# Patient Record
Sex: Male | Born: 1955
Health system: Southern US, Community
[De-identification: ages and names within clinical notes are randomized; demographics above are authoritative.]

## PROBLEM LIST (undated history)

## (undated) DIAGNOSIS — I10 Essential (primary) hypertension: Secondary | ICD-10-CM

## (undated) DIAGNOSIS — I726 Aneurysm of vertebral artery: Secondary | ICD-10-CM

## (undated) DIAGNOSIS — K5900 Constipation, unspecified: Secondary | ICD-10-CM

## (undated) DIAGNOSIS — R911 Solitary pulmonary nodule: Secondary | ICD-10-CM

## (undated) DIAGNOSIS — D751 Secondary polycythemia: Secondary | ICD-10-CM

## (undated) DIAGNOSIS — F32A Depression, unspecified: Secondary | ICD-10-CM

## (undated) DIAGNOSIS — E559 Vitamin D deficiency, unspecified: Secondary | ICD-10-CM

## (undated) DIAGNOSIS — K219 Gastro-esophageal reflux disease without esophagitis: Secondary | ICD-10-CM

## (undated) HISTORY — PX: LIPOMA EXCISION: SHX5283

## (undated) HISTORY — DX: Gastro-esophageal reflux disease without esophagitis: K21.9

## (undated) HISTORY — DX: Solitary pulmonary nodule: R91.1

---

## 2003-11-19 ENCOUNTER — Encounter: Admission: RE | Admit: 2003-11-19 | Discharge: 2003-11-19 | Payer: Self-pay | Admitting: Cardiology

## 2004-01-02 ENCOUNTER — Encounter (INDEPENDENT_AMBULATORY_CARE_PROVIDER_SITE_OTHER): Payer: Self-pay | Admitting: Specialist

## 2004-01-02 ENCOUNTER — Ambulatory Visit (HOSPITAL_BASED_OUTPATIENT_CLINIC_OR_DEPARTMENT_OTHER): Admission: RE | Admit: 2004-01-02 | Discharge: 2004-01-02 | Payer: Self-pay | Admitting: General Surgery

## 2004-01-02 ENCOUNTER — Ambulatory Visit (HOSPITAL_COMMUNITY): Admission: RE | Admit: 2004-01-02 | Discharge: 2004-01-02 | Payer: Self-pay | Admitting: General Surgery

## 2004-02-12 ENCOUNTER — Encounter: Admission: RE | Admit: 2004-02-12 | Discharge: 2004-02-12 | Payer: Self-pay | Admitting: Specialist

## 2004-05-23 ENCOUNTER — Ambulatory Visit (HOSPITAL_COMMUNITY): Admission: RE | Admit: 2004-05-23 | Discharge: 2004-05-23 | Payer: Self-pay | Admitting: Neurosurgery

## 2004-07-28 ENCOUNTER — Encounter: Admission: RE | Admit: 2004-07-28 | Discharge: 2004-07-28 | Payer: Self-pay | Admitting: Cardiology

## 2005-05-19 ENCOUNTER — Encounter: Admission: RE | Admit: 2005-05-19 | Discharge: 2005-06-12 | Payer: Self-pay | Admitting: Orthopedic Surgery

## 2007-08-03 ENCOUNTER — Emergency Department (HOSPITAL_COMMUNITY): Admission: EM | Admit: 2007-08-03 | Discharge: 2007-08-03 | Payer: Self-pay | Admitting: Emergency Medicine

## 2007-08-29 ENCOUNTER — Encounter: Admission: RE | Admit: 2007-08-29 | Discharge: 2007-08-29 | Payer: Self-pay | Admitting: Cardiology

## 2007-10-05 ENCOUNTER — Ambulatory Visit: Payer: Self-pay | Admitting: Internal Medicine

## 2007-10-11 ENCOUNTER — Ambulatory Visit: Admission: RE | Admit: 2007-10-11 | Discharge: 2007-10-11 | Payer: Self-pay | Admitting: Internal Medicine

## 2007-10-11 LAB — CBC WITH DIFFERENTIAL/PLATELET
Basophils Absolute: 0.1 10*3/uL (ref 0.0–0.1)
EOS%: 0.6 % (ref 0.0–7.0)
Eosinophils Absolute: 0 10*3/uL (ref 0.0–0.5)
HGB: 19.2 g/dL — ABNORMAL HIGH (ref 13.0–17.1)
NEUT#: 4.9 10*3/uL (ref 1.5–6.5)
RDW: 14 % (ref 11.2–14.6)
lymph#: 2.1 10*3/uL (ref 0.9–3.3)

## 2007-10-14 LAB — COMPREHENSIVE METABOLIC PANEL
AST: 18 U/L (ref 0–37)
Albumin: 4.6 g/dL (ref 3.5–5.2)
BUN: 14 mg/dL (ref 6–23)
Calcium: 9.5 mg/dL (ref 8.4–10.5)
Chloride: 100 mEq/L (ref 96–112)
Potassium: 3.7 mEq/L (ref 3.5–5.3)
Total Protein: 7.6 g/dL (ref 6.0–8.3)

## 2007-10-14 LAB — JAK2 GENOTYPR

## 2007-10-14 LAB — ERYTHROPOIETIN: Erythropoietin: 10.5 m[IU]/mL (ref 2.6–34.0)

## 2007-10-14 LAB — JAK2 EXONS 12 & 13 MUTATION, REFLEX: JAK2 Exons 12 & 13: 13

## 2007-10-18 ENCOUNTER — Ambulatory Visit (HOSPITAL_COMMUNITY): Admission: RE | Admit: 2007-10-18 | Discharge: 2007-10-18 | Payer: Self-pay | Admitting: Internal Medicine

## 2007-10-25 LAB — CBC WITH DIFFERENTIAL/PLATELET
BASO%: 0.4 % (ref 0.0–2.0)
EOS%: 0.4 % (ref 0.0–7.0)
MCH: 29.6 pg (ref 28.0–33.4)
MCHC: 34.9 g/dL (ref 32.0–35.9)
MONO%: 7.3 % (ref 0.0–13.0)
RDW: 13.7 % (ref 11.2–14.6)
lymph#: 2.3 10*3/uL (ref 0.9–3.3)

## 2007-11-18 ENCOUNTER — Ambulatory Visit: Payer: Self-pay | Admitting: Internal Medicine

## 2007-11-22 LAB — COMPREHENSIVE METABOLIC PANEL
ALT: 12 U/L (ref 0–53)
AST: 13 U/L (ref 0–37)
Albumin: 4.2 g/dL (ref 3.5–5.2)
Alkaline Phosphatase: 109 U/L (ref 39–117)
BUN: 16 mg/dL (ref 6–23)
CO2: 22 mEq/L (ref 19–32)
Calcium: 9.3 mg/dL (ref 8.4–10.5)
Chloride: 106 mEq/L (ref 96–112)
Creatinine, Ser: 1.04 mg/dL (ref 0.40–1.50)
Glucose, Bld: 116 mg/dL — ABNORMAL HIGH (ref 70–99)
Potassium: 3.8 mEq/L (ref 3.5–5.3)
Sodium: 140 mEq/L (ref 135–145)
Total Bilirubin: 1.4 mg/dL — ABNORMAL HIGH (ref 0.3–1.2)
Total Protein: 7.1 g/dL (ref 6.0–8.3)

## 2007-11-22 LAB — CBC WITH DIFFERENTIAL/PLATELET
BASO%: 1.3 % (ref 0.0–2.0)
Basophils Absolute: 0.1 10*3/uL (ref 0.0–0.1)
EOS%: 0.8 % (ref 0.0–7.0)
Eosinophils Absolute: 0.1 10*3/uL (ref 0.0–0.5)
HCT: 46.6 % (ref 38.7–49.9)
HGB: 16.2 g/dL (ref 13.0–17.1)
LYMPH%: 33.5 % (ref 14.0–48.0)
MCH: 28.9 pg (ref 28.0–33.4)
MCHC: 34.9 g/dL (ref 32.0–35.9)
MCV: 82.8 fL (ref 81.6–98.0)
MONO#: 0.5 10*3/uL (ref 0.1–0.9)
MONO%: 7.5 % (ref 0.0–13.0)
NEUT#: 3.7 10*3/uL (ref 1.5–6.5)
NEUT%: 56.8 % (ref 40.0–75.0)
Platelets: 244 10*3/uL (ref 145–400)
RBC: 5.63 10*6/uL (ref 4.20–5.71)
RDW: 13 % (ref 11.2–14.6)
WBC: 6.5 10*3/uL (ref 4.0–10.0)
lymph#: 2.2 10*3/uL (ref 0.9–3.3)

## 2007-11-22 LAB — LACTATE DEHYDROGENASE: LDH: 137 U/L (ref 94–250)

## 2007-12-20 ENCOUNTER — Emergency Department (HOSPITAL_COMMUNITY): Admission: EM | Admit: 2007-12-20 | Discharge: 2007-12-20 | Payer: Self-pay | Admitting: Emergency Medicine

## 2007-12-20 LAB — CBC WITH DIFFERENTIAL/PLATELET
BASO%: 0.6 % (ref 0.0–2.0)
Basophils Absolute: 0 10*3/uL (ref 0.0–0.1)
EOS%: 1 % (ref 0.0–7.0)
Eosinophils Absolute: 0.1 10*3/uL (ref 0.0–0.5)
HCT: 48.7 % (ref 38.7–49.9)
HGB: 16.8 g/dL (ref 13.0–17.1)
LYMPH%: 30.8 % (ref 14.0–48.0)
MCH: 29.2 pg (ref 28.0–33.4)
MCHC: 34.5 g/dL (ref 32.0–35.9)
MCV: 84.6 fL (ref 81.6–98.0)
MONO#: 0.5 10*3/uL (ref 0.1–0.9)
MONO%: 7.7 % (ref 0.0–13.0)
NEUT#: 3.6 10*3/uL (ref 1.5–6.5)
NEUT%: 59.9 % (ref 40.0–75.0)
Platelets: 207 10*3/uL (ref 145–400)
RBC: 5.76 10*6/uL — ABNORMAL HIGH (ref 4.20–5.71)
RDW: 13.6 % (ref 11.2–14.6)
WBC: 6 10*3/uL (ref 4.0–10.0)
lymph#: 1.8 10*3/uL (ref 0.9–3.3)

## 2007-12-20 LAB — COMPREHENSIVE METABOLIC PANEL
ALT: 15 U/L (ref 0–53)
AST: 16 U/L (ref 0–37)
Albumin: 4.1 g/dL (ref 3.5–5.2)
Alkaline Phosphatase: 112 U/L (ref 39–117)
BUN: 14 mg/dL (ref 6–23)
CO2: 26 mEq/L (ref 19–32)
Calcium: 9 mg/dL (ref 8.4–10.5)
Chloride: 104 mEq/L (ref 96–112)
Creatinine, Ser: 1.12 mg/dL (ref 0.40–1.50)
Glucose, Bld: 85 mg/dL (ref 70–99)
Potassium: 4.2 mEq/L (ref 3.5–5.3)
Sodium: 139 mEq/L (ref 135–145)
Total Bilirubin: 1.5 mg/dL — ABNORMAL HIGH (ref 0.3–1.2)
Total Protein: 7.1 g/dL (ref 6.0–8.3)

## 2007-12-20 LAB — LACTATE DEHYDROGENASE: LDH: 151 U/L (ref 94–250)

## 2008-03-13 ENCOUNTER — Ambulatory Visit: Payer: Self-pay | Admitting: Internal Medicine

## 2008-03-15 LAB — COMPREHENSIVE METABOLIC PANEL
ALT: 13 U/L (ref 0–53)
AST: 13 U/L (ref 0–37)
Albumin: 3.9 g/dL (ref 3.5–5.2)
Alkaline Phosphatase: 99 U/L (ref 39–117)
BUN: 12 mg/dL (ref 6–23)
CO2: 28 mEq/L (ref 19–32)
Calcium: 8.7 mg/dL (ref 8.4–10.5)
Chloride: 103 mEq/L (ref 96–112)
Creatinine, Ser: 1.09 mg/dL (ref 0.40–1.50)
Glucose, Bld: 83 mg/dL (ref 70–99)
Potassium: 3.5 mEq/L (ref 3.5–5.3)
Sodium: 141 mEq/L (ref 135–145)
Total Bilirubin: 1.4 mg/dL — ABNORMAL HIGH (ref 0.3–1.2)
Total Protein: 6.7 g/dL (ref 6.0–8.3)

## 2008-03-15 LAB — CBC WITH DIFFERENTIAL/PLATELET
BASO%: 0.7 % (ref 0.0–2.0)
Basophils Absolute: 0 10*3/uL (ref 0.0–0.1)
EOS%: 0.8 % (ref 0.0–7.0)
Eosinophils Absolute: 0 10*3/uL (ref 0.0–0.5)
HCT: 49.8 % (ref 38.7–49.9)
HGB: 17.1 g/dL (ref 13.0–17.1)
LYMPH%: 28.2 % (ref 14.0–48.0)
MCH: 28.6 pg (ref 28.0–33.4)
MCHC: 34.4 g/dL (ref 32.0–35.9)
MCV: 83.2 fL (ref 81.6–98.0)
MONO#: 0.6 10*3/uL (ref 0.1–0.9)
MONO%: 11.7 % (ref 0.0–13.0)
NEUT#: 3.2 10*3/uL (ref 1.5–6.5)
NEUT%: 58.6 % (ref 40.0–75.0)
Platelets: 188 10*3/uL (ref 145–400)
RBC: 5.99 10*6/uL — ABNORMAL HIGH (ref 4.20–5.71)
RDW: 14.9 % — ABNORMAL HIGH (ref 11.2–14.6)
WBC: 5.4 10*3/uL (ref 4.0–10.0)
lymph#: 1.5 10*3/uL (ref 0.9–3.3)

## 2008-03-15 LAB — LACTATE DEHYDROGENASE: LDH: 157 U/L (ref 94–250)

## 2008-04-16 LAB — CBC WITH DIFFERENTIAL/PLATELET
BASO%: 0.7 % (ref 0.0–2.0)
Basophils Absolute: 0 10*3/uL (ref 0.0–0.1)
EOS%: 0.4 % (ref 0.0–7.0)
Eosinophils Absolute: 0 10*3/uL (ref 0.0–0.5)
HCT: 48.8 % (ref 38.4–49.9)
HGB: 16.4 g/dL (ref 13.0–17.1)
LYMPH%: 28.5 % (ref 14.0–49.0)
MCH: 28.1 pg (ref 27.2–33.4)
MCHC: 33.7 g/dL (ref 32.0–36.0)
MCV: 83.5 fL (ref 79.3–98.0)
MONO#: 0.6 10*3/uL (ref 0.1–0.9)
MONO%: 8.3 % (ref 0.0–14.0)
NEUT#: 4.6 10*3/uL (ref 1.5–6.5)
NEUT%: 62.1 % (ref 39.0–75.0)
Platelets: 213 10*3/uL (ref 140–400)
RBC: 5.84 10*6/uL — ABNORMAL HIGH (ref 4.20–5.82)
RDW: 14.7 % — ABNORMAL HIGH (ref 11.0–14.6)
WBC: 7.4 10*3/uL (ref 4.0–10.3)
lymph#: 2.1 10*3/uL (ref 0.9–3.3)

## 2008-05-09 ENCOUNTER — Ambulatory Visit: Payer: Self-pay | Admitting: Internal Medicine

## 2008-05-14 LAB — CBC WITH DIFFERENTIAL/PLATELET
BASO%: 0.3 % (ref 0.0–2.0)
Basophils Absolute: 0 10*3/uL (ref 0.0–0.1)
EOS%: 1.1 % (ref 0.0–7.0)
Eosinophils Absolute: 0.1 10*3/uL (ref 0.0–0.5)
HCT: 48.3 % (ref 38.4–49.9)
HGB: 16.2 g/dL (ref 13.0–17.1)
LYMPH%: 30.5 % (ref 14.0–49.0)
MCH: 27.8 pg (ref 27.2–33.4)
MCHC: 33.5 g/dL (ref 32.0–36.0)
MCV: 83.2 fL (ref 79.3–98.0)
MONO#: 0.5 10*3/uL (ref 0.1–0.9)
MONO%: 8.5 % (ref 0.0–14.0)
NEUT#: 3.8 10*3/uL (ref 1.5–6.5)
NEUT%: 59.6 % (ref 39.0–75.0)
Platelets: 214 10*3/uL (ref 140–400)
RBC: 5.81 10*6/uL (ref 4.20–5.82)
RDW: 13.8 % (ref 11.0–14.6)
WBC: 6.3 10*3/uL (ref 4.0–10.3)
lymph#: 1.9 10*3/uL (ref 0.9–3.3)

## 2008-07-05 ENCOUNTER — Ambulatory Visit: Payer: Self-pay | Admitting: Internal Medicine

## 2008-09-07 ENCOUNTER — Ambulatory Visit: Payer: Self-pay | Admitting: Internal Medicine

## 2008-09-11 LAB — CBC WITH DIFFERENTIAL/PLATELET
BASO%: 0.9 % (ref 0.0–2.0)
Basophils Absolute: 0.1 10*3/uL (ref 0.0–0.1)
EOS%: 0.8 % (ref 0.0–7.0)
Eosinophils Absolute: 0.1 10*3/uL (ref 0.0–0.5)
HCT: 45.9 % (ref 38.4–49.9)
HGB: 15.4 g/dL (ref 13.0–17.1)
LYMPH%: 29.8 % (ref 14.0–49.0)
MCH: 26.7 pg — ABNORMAL LOW (ref 27.2–33.4)
MCHC: 33.5 g/dL (ref 32.0–36.0)
MCV: 79.7 fL (ref 79.3–98.0)
MONO#: 0.5 10*3/uL (ref 0.1–0.9)
MONO%: 8.1 % (ref 0.0–14.0)
NEUT#: 3.8 10*3/uL (ref 1.5–6.5)
NEUT%: 60.4 % (ref 39.0–75.0)
Platelets: 191 10*3/uL (ref 140–400)
RBC: 5.76 10*6/uL (ref 4.20–5.82)
RDW: 14.3 % (ref 11.0–14.6)
WBC: 6.3 10*3/uL (ref 4.0–10.3)
lymph#: 1.9 10*3/uL (ref 0.9–3.3)

## 2008-09-11 LAB — LACTATE DEHYDROGENASE: LDH: 137 U/L (ref 94–250)

## 2009-03-11 ENCOUNTER — Ambulatory Visit: Payer: Self-pay | Admitting: Internal Medicine

## 2009-03-13 LAB — CBC WITH DIFFERENTIAL/PLATELET
BASO%: 0.5 % (ref 0.0–2.0)
Basophils Absolute: 0 10*3/uL (ref 0.0–0.1)
EOS%: 0.9 % (ref 0.0–7.0)
Eosinophils Absolute: 0.1 10*3/uL (ref 0.0–0.5)
HCT: 49.8 % (ref 38.4–49.9)
HGB: 16.5 g/dL (ref 13.0–17.1)
LYMPH%: 31.1 % (ref 14.0–49.0)
MCH: 27.2 pg (ref 27.2–33.4)
MCHC: 33.1 g/dL (ref 32.0–36.0)
MCV: 82.2 fL (ref 79.3–98.0)
MONO#: 0.5 10*3/uL (ref 0.1–0.9)
MONO%: 7.3 % (ref 0.0–14.0)
NEUT#: 4.2 10*3/uL (ref 1.5–6.5)
NEUT%: 60.2 % (ref 39.0–75.0)
Platelets: 200 10*3/uL (ref 140–400)
RBC: 6.06 10*6/uL — ABNORMAL HIGH (ref 4.20–5.82)
RDW: 15.5 % — ABNORMAL HIGH (ref 11.0–14.6)
WBC: 7 10*3/uL (ref 4.0–10.3)
lymph#: 2.2 10*3/uL (ref 0.9–3.3)

## 2009-03-13 LAB — LACTATE DEHYDROGENASE: LDH: 150 U/L (ref 94–250)

## 2009-06-11 ENCOUNTER — Ambulatory Visit: Payer: Self-pay | Admitting: Internal Medicine

## 2010-06-27 NOTE — Op Note (Signed)
NAMEMARLIN, Aaron Dennis                 ACCOUNT NO.:  [id number]   MEDICAL RECORD NO.:  [id number]          PATIENT TYPE:  AMB   LOCATION:  DSC                          FACILITY:  [facility]   PHYSICIAN:  Leonie Man, M.D.   DATE OF BIRTH:  [birth year]   DATE OF PROCEDURE:  01/02/2004  DATE OF DISCHARGE:                                 OPERATIVE REPORT   PREOPERATIVE DIAGNOSIS:  Lipoma right flank.   POSTOPERATIVE DIAGNOSIS:  Lipoma right flank.   PROCEDURE:  Excision of lipoma right flank.   INDICATIONS FOR PROCEDURE:  The patient is a 55 year old man who presents  with a 6 cm mass overlying the lower ribs of the right flank.  This is  clinically a lipoma.  He comes to the operating room after risks and  potential benefits of surgery have been discussed with him and all questions  obtained.   DESCRIPTION OF PROCEDURE:  The patient is positioned in the left lateral  position with the right side up.  The mass is prepped and draped to be  included in a sterile operative field.  The area is infiltrated with 0.5%  Marcaine with epinephrine and a transverse incision is carried down over the  mass, deepened through the skin and subcutaneous tissue, carried down to the  capsule of the mass.  The capsule of the mass is opened and the entire mass  is dissected free and removed in its entirety and forwarded for pathologic  evaluation.  Hemostasis is assured with electrocautery.  Needle, sponge, and  instrument counts correct.  Subcutaneous tissue is closed with interrupted 3-  0 Vicryl suture.  The skin is closed with a running 4-0 Monocryl suture and  then reinforced with Steri-Strips.  A sterile dressing is applied.  Anesthetic reversed.  The patient removed from the operating room to the  recovery room in stable condition tolerating the procedure very well.      Patr   PB/MEDQ  D:  01/02/2004  T:  01/03/2004  Job:  [id number]

## 2010-07-04 ENCOUNTER — Inpatient Hospital Stay (INDEPENDENT_AMBULATORY_CARE_PROVIDER_SITE_OTHER)
Admission: RE | Admit: 2010-07-04 | Discharge: 2010-07-04 | Disposition: A | Discharge status: Home / Self Care | Payer: Self-pay | Source: Ambulatory Visit | Attending: Family Medicine | Admitting: Family Medicine

## 2010-07-04 DIAGNOSIS — M67919 Unspecified disorder of synovium and tendon, unspecified shoulder: Secondary | ICD-10-CM

## 2010-07-04 DIAGNOSIS — R198 Other specified symptoms and signs involving the digestive system and abdomen: Secondary | ICD-10-CM

## 2010-07-04 DIAGNOSIS — M719 Bursopathy, unspecified: Secondary | ICD-10-CM

## 2010-08-07 ENCOUNTER — Emergency Department (HOSPITAL_COMMUNITY)
Admission: EM | Admit: 2010-08-07 | Discharge: 2010-08-07 | Disposition: A | Discharge status: Home / Self Care | Payer: Self-pay | Attending: Emergency Medicine | Admitting: Emergency Medicine

## 2010-08-07 DIAGNOSIS — S61209A Unspecified open wound of unspecified finger without damage to nail, initial encounter: Secondary | ICD-10-CM | POA: Insufficient documentation

## 2010-08-07 DIAGNOSIS — IMO0002 Reserved for concepts with insufficient information to code with codable children: Secondary | ICD-10-CM | POA: Insufficient documentation

## 2010-08-07 DIAGNOSIS — I10 Essential (primary) hypertension: Secondary | ICD-10-CM | POA: Insufficient documentation

## 2010-09-08 ENCOUNTER — Inpatient Hospital Stay (INDEPENDENT_AMBULATORY_CARE_PROVIDER_SITE_OTHER)
Admission: RE | Admit: 2010-09-08 | Discharge: 2010-09-08 | Disposition: A | Discharge status: Home / Self Care | Payer: Self-pay | Source: Ambulatory Visit | Attending: Family Medicine | Admitting: Family Medicine

## 2010-09-08 DIAGNOSIS — M79609 Pain in unspecified limb: Secondary | ICD-10-CM

## 2010-10-07 ENCOUNTER — Other Ambulatory Visit (HOSPITAL_COMMUNITY): Payer: Self-pay | Admitting: Family Medicine

## 2010-10-07 ENCOUNTER — Ambulatory Visit (HOSPITAL_COMMUNITY)
Admission: RE | Admit: 2010-10-07 | Discharge: 2010-10-07 | Disposition: A | Discharge status: Home / Self Care | Payer: Self-pay | Source: Ambulatory Visit | Attending: Family Medicine | Admitting: Family Medicine

## 2010-10-07 DIAGNOSIS — R52 Pain, unspecified: Secondary | ICD-10-CM

## 2010-10-07 DIAGNOSIS — M79609 Pain in unspecified limb: Secondary | ICD-10-CM | POA: Insufficient documentation

## 2010-10-22 ENCOUNTER — Ambulatory Visit: Payer: Self-pay | Attending: Family Medicine | Admitting: Physical Therapy

## 2010-10-22 DIAGNOSIS — M542 Cervicalgia: Secondary | ICD-10-CM | POA: Insufficient documentation

## 2010-10-22 DIAGNOSIS — M2569 Stiffness of other specified joint, not elsewhere classified: Secondary | ICD-10-CM | POA: Insufficient documentation

## 2010-10-22 DIAGNOSIS — M25579 Pain in unspecified ankle and joints of unspecified foot: Secondary | ICD-10-CM | POA: Insufficient documentation

## 2010-10-22 DIAGNOSIS — IMO0001 Reserved for inherently not codable concepts without codable children: Secondary | ICD-10-CM | POA: Insufficient documentation

## 2010-10-22 DIAGNOSIS — M25519 Pain in unspecified shoulder: Secondary | ICD-10-CM | POA: Insufficient documentation

## 2010-10-28 ENCOUNTER — Ambulatory Visit: Payer: Self-pay | Admitting: Physical Therapy

## 2010-10-30 ENCOUNTER — Ambulatory Visit: Payer: Self-pay | Admitting: Physical Therapy

## 2010-11-03 ENCOUNTER — Ambulatory Visit: Payer: Self-pay | Admitting: Physical Therapy

## 2010-11-06 ENCOUNTER — Ambulatory Visit: Payer: Self-pay | Admitting: Physical Therapy

## 2010-11-11 ENCOUNTER — Ambulatory Visit: Payer: Self-pay | Attending: Family Medicine | Admitting: Physical Therapy

## 2010-11-11 DIAGNOSIS — M542 Cervicalgia: Secondary | ICD-10-CM | POA: Insufficient documentation

## 2010-11-11 DIAGNOSIS — IMO0001 Reserved for inherently not codable concepts without codable children: Secondary | ICD-10-CM | POA: Insufficient documentation

## 2010-11-11 DIAGNOSIS — M25579 Pain in unspecified ankle and joints of unspecified foot: Secondary | ICD-10-CM | POA: Insufficient documentation

## 2010-11-11 DIAGNOSIS — M25519 Pain in unspecified shoulder: Secondary | ICD-10-CM | POA: Insufficient documentation

## 2010-11-11 DIAGNOSIS — M2569 Stiffness of other specified joint, not elsewhere classified: Secondary | ICD-10-CM | POA: Insufficient documentation

## 2010-11-12 ENCOUNTER — Ambulatory Visit: Payer: Self-pay | Admitting: Physical Therapy

## 2010-11-12 LAB — BLOOD GAS, ARTERIAL
Acid-Base Excess: 2.2 — ABNORMAL HIGH
O2 Saturation: 97.2
Patient temperature: 98.6
TCO2: 21.5

## 2010-11-13 ENCOUNTER — Ambulatory Visit: Payer: Self-pay | Admitting: Physical Therapy

## 2010-11-14 ENCOUNTER — Ambulatory Visit: Payer: Self-pay | Admitting: Physical Therapy

## 2010-11-17 ENCOUNTER — Ambulatory Visit: Payer: Self-pay | Admitting: Physical Therapy

## 2010-11-19 ENCOUNTER — Ambulatory Visit: Payer: Self-pay | Admitting: Physical Therapy

## 2010-12-02 ENCOUNTER — Ambulatory Visit: Payer: Self-pay | Admitting: Physical Therapy

## 2010-12-05 ENCOUNTER — Ambulatory Visit: Payer: Self-pay | Admitting: Physical Therapy

## 2011-01-30 ENCOUNTER — Emergency Department (INDEPENDENT_AMBULATORY_CARE_PROVIDER_SITE_OTHER)
Admission: EM | Admit: 2011-01-30 | Discharge: 2011-01-30 | Disposition: A | Discharge status: Home / Self Care | Payer: Self-pay | Source: Home / Self Care

## 2011-01-30 DIAGNOSIS — M545 Low back pain, unspecified: Secondary | ICD-10-CM

## 2011-01-30 DIAGNOSIS — I10 Essential (primary) hypertension: Secondary | ICD-10-CM

## 2011-01-30 HISTORY — DX: Essential (primary) hypertension: I10

## 2011-01-30 LAB — POCT URINALYSIS DIP (DEVICE)
Glucose, UA: NEGATIVE mg/dL
Hgb urine dipstick: NEGATIVE
Protein, ur: NEGATIVE mg/dL
Specific Gravity, Urine: 1.02 (ref 1.005–1.030)
Urobilinogen, UA: 1 mg/dL (ref 0.0–1.0)
pH: 7 (ref 5.0–8.0)

## 2011-01-30 MED ORDER — OLMESARTAN MEDOXOMIL 40 MG PO TABS: 40.0000 mg | ORAL_TABLET | Freq: Every day | ORAL | Status: DC

## 2011-01-30 MED ORDER — TRAMADOL HCL 50 MG PO TABS: 50.0000 mg | ORAL_TABLET | Freq: Four times a day (QID) | ORAL | Status: AC | PRN

## 2011-01-30 NOTE — ED Provider Notes (Signed)
Medical screening examination/treatment/procedure(s) were performed by non-physician practitioner and as supervising physician I was immediately available for consultation/collaboration.  Raynald Blend, MD 01/30/11 [phone]

## 2011-01-30 NOTE — ED Provider Notes (Signed)
History     CSN: [id number]  Arrival date & time 01/30/11  4098   None     Chief Complaint  Patient presents with  . Muscle Pain    (Consider location/radiation/quality/duration/timing/severity/associated sxs/prior treatment) HPI Comments: Pt c/o pain Rt lower back, Rt flank and Rt lower abdomen x 2-3 weeks. Worse with movement (pt demonstrates twisting movement and extension of back), and prolonged sitting. No injury. He has seen his PCP regarding this and states the medication they prescribed is not helping and have scheduled physical therapy. He scheduled to start PT Jan 6th or 7th, "but I can't wait until then I am in a lot of pain." "I need something really strong for the pain." He states this is the same pain, and is not progressing or changing. Pt also requests a refill of his BP medication. He states he is about to run out and will not be able to see his PCP in time for refill.     Past Medical History  Diagnosis Date  . Hypertension     History reviewed. No pertinent past surgical history.  History reviewed. No pertinent family history.  History  Substance Use Topics  . Smoking status: Never Smoker   . Smokeless tobacco: Not on file  . Alcohol Use: No      Review of Systems  Constitutional: Negative for fever and chills.  Respiratory: Negative for cough and shortness of breath.   Cardiovascular: Negative for chest pain.  Gastrointestinal: Positive for abdominal pain. Negative for nausea, vomiting, diarrhea and constipation.  Genitourinary: Positive for hematuria. Negative for dysuria and frequency.  Musculoskeletal: Positive for back pain.  Neurological: Negative for numbness.    Allergies  Review of patient's allergies indicates no known allergies.  Home Medications   Current Outpatient Rx  Name Route Sig Dispense Refill  . DICLOFENAC SODIUM 75 MG PO TBEC Oral Take 75 mg by mouth 2 (two) times daily.      Marland Kitchen OLMESARTAN MEDOXOMIL 40 MG PO TABS Oral  Take 1 tablet (40 mg total) by mouth daily. 30 tablet 0  . TRAMADOL HCL 50 MG PO TABS Oral Take 1 tablet (50 mg total) by mouth every 6 (six) hours as needed for pain. Maximum dose= 8 tablets per day 12 tablet 0    BP 143/87  Pulse 70  Temp(Src) 97.8 F (36.6 C) (Oral)  Resp 16  SpO2 98%  Physical Exam  Nursing note and vitals reviewed. Constitutional: He is oriented to person, place, and time. He appears well-developed and well-nourished. No distress.  Cardiovascular: Normal rate, regular rhythm and normal heart sounds.   Pulmonary/Chest: Effort normal and breath sounds normal. No respiratory distress.  Abdominal: Soft. He exhibits no distension and no mass. There is no hepatosplenomegaly. There is tenderness. There is no guarding, no CVA tenderness, no tenderness at McBurney's point and negative Murphy's sign.    Musculoskeletal: Normal range of motion.       Lumbar back: He exhibits normal range of motion, no tenderness, no bony tenderness, no edema, no deformity and no spasm.  Neurological: He is alert and oriented to person, place, and time. He has normal strength. No sensory deficit. Gait normal.  Reflex Scores:      Patellar reflexes are 2+ on the right side and 2+ on the left side. Skin: Skin is warm and dry.  Psychiatric: He has a normal mood and affect.    ED Course  Procedures (including critical care time)   Labs Reviewed  POCT URINALYSIS DIP (DEVICE)   No results found.   1. Low back pain   2. Hypertension       MDM  UA neg - doubt renal lithiasis. Pt has seen PCP regarding same symptoms and has appt to begin Physical Therapy.         Melody Comas, PA 01/30/11 1925  Melody Comas, PA 01/30/11 1926  Melody Comas, [place] 01/30/11 1927

## 2011-01-30 NOTE — ED Notes (Signed)
C/o pain in bilateral low abdominal area w radiation into right low back for 2 weeks; has been seen at [facility] for pain in back, and has an appt to see PT for this issue, but the pain having now is different; denies issues w bowel or bladder function; NAD at present; pain in abdominal area  worse when tries to bend over or straighten his back

## 2011-02-12 ENCOUNTER — Ambulatory Visit: Payer: Self-pay | Attending: Family Medicine | Admitting: Physical Therapy

## 2011-02-12 DIAGNOSIS — M542 Cervicalgia: Secondary | ICD-10-CM | POA: Insufficient documentation

## 2011-02-12 DIAGNOSIS — IMO0001 Reserved for inherently not codable concepts without codable children: Secondary | ICD-10-CM | POA: Insufficient documentation

## 2011-02-12 DIAGNOSIS — M25519 Pain in unspecified shoulder: Secondary | ICD-10-CM | POA: Insufficient documentation

## 2011-02-12 DIAGNOSIS — M2569 Stiffness of other specified joint, not elsewhere classified: Secondary | ICD-10-CM | POA: Insufficient documentation

## 2011-02-12 DIAGNOSIS — M25579 Pain in unspecified ankle and joints of unspecified foot: Secondary | ICD-10-CM | POA: Insufficient documentation

## 2011-02-16 ENCOUNTER — Ambulatory Visit: Payer: Self-pay | Admitting: Physical Therapy

## 2011-02-18 ENCOUNTER — Ambulatory Visit: Payer: Self-pay | Admitting: Physical Therapy

## 2011-02-20 ENCOUNTER — Ambulatory Visit: Payer: Self-pay | Admitting: Physical Therapy

## 2011-02-23 ENCOUNTER — Ambulatory Visit: Payer: Self-pay | Admitting: Physical Therapy

## 2011-02-25 ENCOUNTER — Other Ambulatory Visit (HOSPITAL_COMMUNITY): Payer: Self-pay | Admitting: Family Medicine

## 2011-02-25 ENCOUNTER — Ambulatory Visit: Payer: Self-pay | Admitting: Physical Therapy

## 2011-02-25 DIAGNOSIS — R10A0 Flank pain, unspecified side: Secondary | ICD-10-CM

## 2011-02-25 DIAGNOSIS — R42 Dizziness and giddiness: Secondary | ICD-10-CM

## 2011-02-25 DIAGNOSIS — R109 Unspecified abdominal pain: Secondary | ICD-10-CM

## 2011-02-25 DIAGNOSIS — R110 Nausea: Secondary | ICD-10-CM

## 2011-02-27 ENCOUNTER — Ambulatory Visit: Payer: Self-pay | Admitting: Physical Therapy

## 2011-03-03 ENCOUNTER — Ambulatory Visit (HOSPITAL_COMMUNITY)
Admission: RE | Admit: 2011-03-03 | Discharge: 2011-03-03 | Disposition: A | Discharge status: Home / Self Care | Payer: Self-pay | Source: Ambulatory Visit | Attending: Family Medicine | Admitting: Family Medicine

## 2011-03-03 DIAGNOSIS — R51 Headache: Secondary | ICD-10-CM | POA: Insufficient documentation

## 2011-03-03 DIAGNOSIS — R109 Unspecified abdominal pain: Secondary | ICD-10-CM | POA: Insufficient documentation

## 2011-03-03 DIAGNOSIS — R42 Dizziness and giddiness: Secondary | ICD-10-CM | POA: Insufficient documentation

## 2011-03-03 DIAGNOSIS — R110 Nausea: Secondary | ICD-10-CM

## 2011-03-03 MED ORDER — IOHEXOL 300 MG/ML  SOLN
80.0000 mL | Freq: Once | INTRAMUSCULAR | Status: AC | PRN
  Administered 2011-03-03: 80 mL via INTRAVENOUS

## 2011-03-04 ENCOUNTER — Ambulatory Visit: Payer: Self-pay | Admitting: Physical Therapy

## 2011-03-06 ENCOUNTER — Ambulatory Visit: Payer: Self-pay | Admitting: Physical Therapy

## 2011-08-24 ENCOUNTER — Other Ambulatory Visit (HOSPITAL_COMMUNITY): Payer: Self-pay | Admitting: Family Medicine

## 2011-08-24 DIAGNOSIS — M542 Cervicalgia: Secondary | ICD-10-CM

## 2011-08-24 DIAGNOSIS — M5430 Sciatica, unspecified side: Secondary | ICD-10-CM

## 2011-08-27 ENCOUNTER — Ambulatory Visit (HOSPITAL_COMMUNITY)
Admission: RE | Admit: 2011-08-27 | Discharge: 2011-08-27 | Disposition: A | Discharge status: Home / Self Care | Payer: Self-pay | Source: Ambulatory Visit | Attending: Family Medicine | Admitting: Family Medicine

## 2011-08-27 DIAGNOSIS — M542 Cervicalgia: Secondary | ICD-10-CM | POA: Insufficient documentation

## 2011-08-27 DIAGNOSIS — G8929 Other chronic pain: Secondary | ICD-10-CM | POA: Insufficient documentation

## 2011-08-27 DIAGNOSIS — M5430 Sciatica, unspecified side: Secondary | ICD-10-CM

## 2011-10-30 ENCOUNTER — Encounter (HOSPITAL_COMMUNITY): Payer: Self-pay | Admitting: Emergency Medicine

## 2011-10-30 ENCOUNTER — Emergency Department (HOSPITAL_COMMUNITY)
Admission: EM | Admit: 2011-10-30 | Discharge: 2011-10-30 | Disposition: A | Discharge status: Home / Self Care | Payer: Self-pay | Attending: Emergency Medicine | Admitting: Emergency Medicine

## 2011-10-30 DIAGNOSIS — I10 Essential (primary) hypertension: Secondary | ICD-10-CM | POA: Insufficient documentation

## 2011-10-30 DIAGNOSIS — H00019 Hordeolum externum unspecified eye, unspecified eyelid: Secondary | ICD-10-CM | POA: Insufficient documentation

## 2011-10-30 MED ORDER — AMOXICILLIN 500 MG PO CAPS: 500.0000 mg | ORAL_CAPSULE | Freq: Three times a day (TID) | ORAL | Status: DC

## 2011-10-30 MED ORDER — OXYCODONE-ACETAMINOPHEN 5-325 MG PO TABS: 1.0000 | ORAL_TABLET | Freq: Four times a day (QID) | ORAL | Status: DC | PRN

## 2011-10-30 NOTE — ED Notes (Signed)
Pt with left eye pain and swelling x 2 days

## 2011-10-30 NOTE — ED Provider Notes (Signed)
History  This chart was scribed for [employer]. Rubin Payor, MD by Shari Heritage. The patient was seen in room TR02C/TR02C. Patient's care was started at 1602.     CSN: [id number]  Arrival date & time 10/30/11  1541   First MD Initiated Contact with Patient 10/30/11 [phone]      Chief Complaint  Patient presents with  . Eye Pain    The history is provided by the patient. No language interpreter was used.    Aaron Dennis is a 56 y.o. male who presents to the Emergency Department complaining of constant, moderate left eye pain with swelling and tearing onset 1 week ago that worsened 2 days ago. There is associated diffuse, moderate facial pain. Patient denies crusting or purulent drainage. He reports no other symptoms at this time.  Past Medical History  Diagnosis Date  . Hypertension     History reviewed. No pertinent past surgical history.  History reviewed. No pertinent family history.  History  Substance Use Topics  . Smoking status: Never Smoker   . Smokeless tobacco: Not on file  . Alcohol Use: No      Review of Systems  HENT: Positive for facial swelling.   Eyes: Positive for pain.  Neurological: Positive for headaches.  All other systems reviewed and are negative.    Allergies  Review of patient's allergies indicates no known allergies.  Home Medications   Current Outpatient Rx  Name Route Sig Dispense Refill  . IBUPROFEN 800 MG PO TABS Oral Take 800 mg by mouth 2 (two) times daily as needed. For pain    . CLEAR EYES OP Ophthalmic Apply 1 drop to eye daily as needed. For dry eyes    . OLMESARTAN MEDOXOMIL 40 MG PO TABS Oral Take 40 mg by mouth daily.    . AMOXICILLIN 500 MG PO CAPS Oral Take 1 capsule (500 mg total) by mouth 3 (three) times daily. 12 capsule 0  . OXYCODONE-ACETAMINOPHEN 5-325 MG PO TABS Oral Take 1-2 tablets by mouth every 6 (six) hours as needed for pain. 8 tablet 0    BP 135/83  Pulse 79  Temp 98.3 F (36.8 C) (Oral)  Resp 16  SpO2  98%  Physical Exam  Constitutional: He is oriented to person, place, and time. He appears well-developed and well-nourished.  HENT:  Head: Normocephalic and atraumatic.  Eyes: EOM are normal.       Swelling of left eyelid. One area of purulence laterally. EOM intact. No induration.  Cardiovascular: Normal rate and regular rhythm.   Pulmonary/Chest: Effort normal and breath sounds normal.  Musculoskeletal: Normal range of motion.  Neurological: He is alert and oriented to person, place, and time.  Skin: Skin is warm and dry.  Psychiatric: He has a normal mood and affect. His behavior is normal.    ED Course  Procedures (including critical care time) 4:13pm- Patient informed of current plan for treatment and evaluation and agrees with plan at this time.    Labs Reviewed - No data to display No results found.   1. Stye       MDM  Left eye sty. No systemic symptoms. Will give short course of oral antibiotics and pain medicines. He'll be doing warm soaks also.     I personally performed the services described in this documentation, which was scribed in my presence. The recorded information has been reviewed and considered.     Juliet Rude. Rubin Payor, MD 10/30/11 [phone]

## 2012-05-24 ENCOUNTER — Encounter (HOSPITAL_COMMUNITY): Payer: Self-pay

## 2012-05-24 ENCOUNTER — Emergency Department (HOSPITAL_COMMUNITY)
Admission: EM | Admit: 2012-05-24 | Discharge: 2012-05-24 | Disposition: A | Discharge status: Home / Self Care | Payer: No Typology Code available for payment source | Source: Home / Self Care

## 2012-05-24 DIAGNOSIS — R109 Unspecified abdominal pain: Secondary | ICD-10-CM

## 2012-05-24 LAB — POCT URINALYSIS DIP (DEVICE)
Ketones, ur: NEGATIVE mg/dL
Leukocytes, UA: NEGATIVE
Protein, ur: NEGATIVE mg/dL
pH: 6 (ref 5.0–8.0)

## 2012-05-24 LAB — BASIC METABOLIC PANEL
BUN: 13 mg/dL (ref 6–23)
Creatinine, Ser: 1.03 mg/dL (ref 0.50–1.35)
GFR calc non Af Amer: 79 mL/min — ABNORMAL LOW (ref >90)
Glucose, Bld: 79 mg/dL (ref 70–99)
Potassium: 3.7 mEq/L (ref 3.5–5.1)

## 2012-05-24 MED ORDER — OLMESARTAN MEDOXOMIL 40 MG PO TABS: 40.0000 mg | ORAL_TABLET | Freq: Every day | ORAL | Status: DC

## 2012-05-24 MED ORDER — OXYCODONE-ACETAMINOPHEN 5-325 MG PO TABS: 1.0000 | ORAL_TABLET | Freq: Four times a day (QID) | ORAL | Status: DC | PRN

## 2012-05-24 NOTE — ED Notes (Signed)
Complain of pain on right side-makes it difficult to sleep at night Bilateral foot pain

## 2012-05-24 NOTE — ED Provider Notes (Signed)
History     CSN: [id number]  Arrival date & time 05/24/12  1522   First MD Initiated Contact with Patient 05/24/12 1547      Chief Complaint  Patient presents with  . Flank Pain    right  side    (Consider location/radiation/quality/duration/timing/severity/associated sxs/prior treatment) HPI Pt is 57 yo male who presents to clinic with 2-3 month duration of right flank pain, associated with nausea, dull and intermittent, non radiating, 4/10 in severity when presents, no specific alleviating or aggravating factors, no traumas to the area. No fevers, chills, other abdominal concerns, no changes in appetite.    Past Medical History  Diagnosis Date  . Hypertension     History reviewed. No pertinent past surgical history.  No significant medical problems in family   History  Substance Use Topics  . Smoking status: Never Smoker   . Smokeless tobacco: Not on file  . Alcohol Use: No      Review of Systems  Constitutional: Negative for fever, chills, diaphoresis, activity change, appetite change and fatigue.  HENT: Negative for ear pain, nosebleeds, congestion, facial swelling, rhinorrhea, neck pain, neck stiffness and ear discharge.   Eyes: Negative for pain, discharge, redness, itching and visual disturbance.  Respiratory: Negative for cough, choking, chest tightness, shortness of breath, wheezing and stridor.   Cardiovascular: Negative for chest pain, palpitations and leg swelling.  Gastrointestinal: Negative for abdominal distention.  Genitourinary: Negative for dysuria, urgency, frequency, hematuria, decreased urine volume, difficulty urinating and dyspareunia.  Musculoskeletal: Negative for back pain, joint swelling, arthralgias and gait problem.  Neurological: Negative for dizziness, tremors, seizures, syncope, facial asymmetry, speech difficulty, weakness, light-headedness, numbness and headaches.  Hematological: Negative for adenopathy. Does not bruise/bleed easily.   Psychiatric/Behavioral: Negative for hallucinations, behavioral problems, confusion, dysphoric mood, decreased concentration and agitation.    Allergies  Review of patient's allergies indicates no known allergies.  Home Medications   Current Outpatient Rx  Name  Route  Sig  Dispense  Refill  . Naphazoline HCl (CLEAR EYES OP)   Ophthalmic   Apply 1 drop to eye daily as needed. For dry eyes         . olmesartan (BENICAR) 40 MG tablet   Oral   Take 40 mg by mouth daily.         Marland Kitchen oxyCODONE-acetaminophen (PERCOCET/ROXICET) 5-325 MG per tablet   Oral   Take 1-2 tablets by mouth every 6 (six) hours as needed for pain.   65 tablet   0     BP 129/83  Pulse 69  Temp(Src) 97.5 F (36.4 C) (Oral)  Resp 17  SpO2 99%  Physical Exam  Constitutional: Appears well-developed and well-nourished. No distress.  HENT: Normocephalic. External right and left ear normal. Oropharynx is clear and moist.  Eyes: Conjunctivae and EOM are normal. PERRLA, no scleral icterus.  Neck: Normal ROM. Neck supple. No JVD. No tracheal deviation. No thyromegaly.  CVS: RRR, S1/S2 +, no murmurs, no gallops, no carotid bruit.  Pulmonary: Effort and breath sounds normal, no stridor, rhonchi, wheezes, rales.  Abdominal: Soft. BS +,  no distension, rebound or guarding. right flank area tender  Musculoskeletal: Normal range of motion. No edema and no tenderness.  Lymphadenopathy: No lymphadenopathy noted, cervical, inguinal. Neuro: Alert. Normal reflexes, muscle tone coordination. No cranial nerve deficit. Skin: Skin is warm and dry. No rash noted. Not diaphoretic. No erythema. No pallor.  Psychiatric: Normal mood and affect. Behavior, judgment, thought content normal.    ED Course  Procedures (including critical care time)  Labs Reviewed  BASIC METABOLIC PANEL  URINALYSIS, DIPSTICK ONLY   No results found.   1. Right flank pain   - unclear etiology, possibly stone, will order renal US to rule out  stone - no need for ABX as pt is afebrile - will order BMP and UA     MDM  Right flank pain, renal US        Dorothea Ogle, MD 05/27/12 2129

## 2012-05-26 ENCOUNTER — Ambulatory Visit (HOSPITAL_COMMUNITY): Payer: No Typology Code available for payment source

## 2012-05-30 ENCOUNTER — Encounter (HOSPITAL_COMMUNITY): Payer: Self-pay

## 2012-05-30 ENCOUNTER — Ambulatory Visit (HOSPITAL_COMMUNITY)
Admission: RE | Admit: 2012-05-30 | Discharge: 2012-05-30 | Disposition: A | Discharge status: Home / Self Care | Payer: No Typology Code available for payment source | Source: Ambulatory Visit | Attending: Internal Medicine | Admitting: Internal Medicine

## 2012-05-30 DIAGNOSIS — R109 Unspecified abdominal pain: Secondary | ICD-10-CM | POA: Insufficient documentation

## 2012-05-30 DIAGNOSIS — N200 Calculus of kidney: Secondary | ICD-10-CM | POA: Insufficient documentation

## 2012-05-30 DIAGNOSIS — N400 Enlarged prostate without lower urinary tract symptoms: Secondary | ICD-10-CM | POA: Insufficient documentation

## 2012-06-08 ENCOUNTER — Encounter (HOSPITAL_COMMUNITY): Payer: Self-pay

## 2012-06-08 ENCOUNTER — Emergency Department (INDEPENDENT_AMBULATORY_CARE_PROVIDER_SITE_OTHER)
Admission: EM | Admit: 2012-06-08 | Discharge: 2012-06-08 | Disposition: A | Discharge status: Home / Self Care | Payer: No Typology Code available for payment source | Source: Home / Self Care

## 2012-06-08 DIAGNOSIS — R10A0 Flank pain, unspecified side: Secondary | ICD-10-CM

## 2012-06-08 DIAGNOSIS — N200 Calculus of kidney: Secondary | ICD-10-CM

## 2012-06-08 DIAGNOSIS — K5900 Constipation, unspecified: Secondary | ICD-10-CM

## 2012-06-08 DIAGNOSIS — R109 Unspecified abdominal pain: Secondary | ICD-10-CM

## 2012-06-08 MED ORDER — SENNA-DOCUSATE SODIUM 8.6-50 MG PO TABS: 2.0000 | ORAL_TABLET | Freq: Every day | ORAL | Status: DC

## 2012-06-08 MED ORDER — MAGNESIUM CITRATE PO SOLN: 296.0000 mL | Freq: Once | ORAL | Status: DC

## 2012-06-08 NOTE — ED Notes (Signed)
Follow up- kidney problems

## 2012-06-09 ENCOUNTER — Telehealth (HOSPITAL_COMMUNITY): Payer: Self-pay

## 2012-06-09 NOTE — ED Notes (Signed)
Spoke with patient Has ct scheduled for Monday 06/13/12 @ 9:30 am To arrive at 9:15 am Needs to pick up the contrast today or tomorrow Patient is aware of his appt and his instructions

## 2012-06-12 NOTE — ED Provider Notes (Signed)
History     CSN: [id number]  Arrival date & time 06/08/12  1653   None     Chief Complaint  Patient presents with  . Follow-up    (Consider location/radiation/quality/duration/timing/severity/associated sxs/prior treatment) The history is provided by the patient. The history is limited by a language barrier.    Past Medical History  Diagnosis Date  . Hypertension     History reviewed. No pertinent past surgical history.  No family history on file.  History  Substance Use Topics  . Smoking status: Never Smoker   . Smokeless tobacco: Not on file  . Alcohol Use: No      Review of Systems  Constitutional: Positive for activity change, appetite change, fatigue and unexpected weight change. Negative for fever and chills.  Eyes: Negative.   Respiratory: Negative.   Cardiovascular: Negative.   Gastrointestinal: Positive for nausea, abdominal pain, constipation, blood in stool, abdominal distention and anal bleeding. Negative for vomiting, diarrhea and rectal pain.  Genitourinary: Negative.   Musculoskeletal: Positive for arthralgias.  Skin: Negative.   Neurological: Negative.   Hematological: Negative.   Psychiatric/Behavioral: Negative.     Allergies  Review of patient's allergies indicates no known allergies.  Home Medications   Current Outpatient Rx  Name  Route  Sig  Dispense  Refill  . magnesium citrate solution   Oral   Take 296 mLs by mouth once.   300 mL   0   . Naphazoline HCl (CLEAR EYES OP)   Ophthalmic   Apply 1 drop to eye daily as needed. For dry eyes         . olmesartan (BENICAR) 40 MG tablet   Oral   Take 1 tablet (40 mg total) by mouth daily.   30 tablet   11   . oxyCODONE-acetaminophen (PERCOCET/ROXICET) 5-325 MG per tablet   Oral   Take 1-2 tablets by mouth every 6 (six) hours as needed for pain.   65 tablet   0   . sennosides-docusate sodium (SENOKOT-S) 8.6-50 MG tablet   Oral   Take 2 tablets by mouth daily. FOR  constipation   60 tablet   11     BP 120/71  Pulse 69  Temp(Src) 98 F (36.7 C) (Oral)  Resp 15  SpO2 97%  Physical Exam  Constitutional: He appears well-developed and well-nourished.  HENT:  Head: Normocephalic and atraumatic.  Mouth/Throat: Oropharynx is clear and moist.  Eyes: Pupils are equal, round, and reactive to light.  Neck: Normal range of motion.  Cardiovascular: Normal rate and regular rhythm.   Pulmonary/Chest: Effort normal and breath sounds normal.  Abdominal: He exhibits distension and mass. There is tenderness.  Fullness palpated right upper quadrant, no ascites, negative murphy's point tenderness at T11-12, +CVA tenderness    ED Course  Procedures (including critical care time)  Labs Reviewed - No data to display No results found.   1. Flank pain   2. Abdominal pain   3. Constipation   4. Kidney stones       MDM  1. Significant ongoing right flank and abdominal pain: US showed intra-renal stones, he has no hematuria so I doubt this is related to kidney stones, he has issues with constipation, has also had weight loss. UA normal at last visit.   Needs complete CT of abdomen, ordered  Need PSA, no clinical description of prostate issues  No urinary symptoms  Treat constipation with aggressive bowel regimen  2. Foot Pain: He is cab driver, has  severe lateral right foot pain. Will delay work-up for now until abdominal pain is resolved.         Edsel Petrin, DO 06/12/12 2300

## 2012-06-13 ENCOUNTER — Ambulatory Visit (HOSPITAL_COMMUNITY)
Admission: RE | Admit: 2012-06-13 | Discharge: 2012-06-13 | Disposition: A | Discharge status: Home / Self Care | Payer: No Typology Code available for payment source | Source: Ambulatory Visit | Attending: Internal Medicine | Admitting: Internal Medicine

## 2012-06-13 DIAGNOSIS — I700 Atherosclerosis of aorta: Secondary | ICD-10-CM | POA: Insufficient documentation

## 2012-06-13 DIAGNOSIS — K5730 Diverticulosis of large intestine without perforation or abscess without bleeding: Secondary | ICD-10-CM | POA: Insufficient documentation

## 2012-06-13 DIAGNOSIS — I2510 Atherosclerotic heart disease of native coronary artery without angina pectoris: Secondary | ICD-10-CM | POA: Insufficient documentation

## 2012-06-13 DIAGNOSIS — R109 Unspecified abdominal pain: Secondary | ICD-10-CM | POA: Insufficient documentation

## 2012-06-13 MED ORDER — IOHEXOL 300 MG/ML  SOLN
100.0000 mL | Freq: Once | INTRAMUSCULAR | Status: AC | PRN
  Administered 2012-06-13: 100 mL via INTRAVENOUS

## 2012-06-22 ENCOUNTER — Ambulatory Visit: Payer: No Typology Code available for payment source

## 2012-06-23 ENCOUNTER — Encounter: Payer: Self-pay | Admitting: Family Medicine

## 2012-06-23 ENCOUNTER — Ambulatory Visit: Payer: No Typology Code available for payment source | Attending: Family Medicine | Admitting: Family Medicine

## 2012-06-23 VITALS — BP 105/75 | HR 72 | Temp 97.4°F | Resp 18 | Ht 70.0 in | Wt 178.0 lb

## 2012-06-23 DIAGNOSIS — IMO0002 Reserved for concepts with insufficient information to code with codable children: Secondary | ICD-10-CM

## 2012-06-23 DIAGNOSIS — R109 Unspecified abdominal pain: Secondary | ICD-10-CM | POA: Insufficient documentation

## 2012-06-23 DIAGNOSIS — M549 Dorsalgia, unspecified: Secondary | ICD-10-CM | POA: Insufficient documentation

## 2012-06-23 DIAGNOSIS — R0789 Other chest pain: Secondary | ICD-10-CM

## 2012-06-23 DIAGNOSIS — R17 Unspecified jaundice: Secondary | ICD-10-CM

## 2012-06-23 DIAGNOSIS — I2510 Atherosclerotic heart disease of native coronary artery without angina pectoris: Secondary | ICD-10-CM

## 2012-06-23 DIAGNOSIS — Z87442 Personal history of urinary calculi: Secondary | ICD-10-CM

## 2012-06-23 DIAGNOSIS — R071 Chest pain on breathing: Secondary | ICD-10-CM

## 2012-06-23 DIAGNOSIS — G8929 Other chronic pain: Secondary | ICD-10-CM

## 2012-06-23 DIAGNOSIS — M5135 Other intervertebral disc degeneration, thoracolumbar region: Secondary | ICD-10-CM

## 2012-06-23 DIAGNOSIS — K5909 Other constipation: Secondary | ICD-10-CM

## 2012-06-23 DIAGNOSIS — K5900 Constipation, unspecified: Secondary | ICD-10-CM | POA: Insufficient documentation

## 2012-06-23 DIAGNOSIS — I25810 Atherosclerosis of coronary artery bypass graft(s) without angina pectoris: Secondary | ICD-10-CM | POA: Insufficient documentation

## 2012-06-23 LAB — LIPID PANEL
Cholesterol: 166 mg/dL (ref 0–200)
HDL: 27 mg/dL — ABNORMAL LOW (ref >39)
Total CHOL/HDL Ratio: 6.1 Ratio
Triglycerides: 98 mg/dL (ref <150)

## 2012-06-23 LAB — SEDIMENTATION RATE: Sed Rate: 1 mm/hr (ref 0–16)

## 2012-06-23 LAB — COMPREHENSIVE METABOLIC PANEL
AST: 21 U/L (ref 0–37)
Albumin: 4.5 g/dL (ref 3.5–5.2)
BUN: 11 mg/dL (ref 6–23)
CO2: 26 mEq/L (ref 19–32)
Calcium: 9.3 mg/dL (ref 8.4–10.5)
Chloride: 106 mEq/L (ref 96–112)
Creat: 1.07 mg/dL (ref 0.50–1.35)
Glucose, Bld: 85 mg/dL (ref 70–99)
Potassium: 4.3 mEq/L (ref 3.5–5.3)

## 2012-06-23 MED ORDER — POLYETHYLENE GLYCOL 3350 17 GM/SCOOP PO POWD: 17.0000 g | Freq: Two times a day (BID) | ORAL | Status: DC | PRN

## 2012-06-23 MED ORDER — BENAZEPRIL HCL 20 MG PO TABS: 20.0000 mg | ORAL_TABLET | Freq: Every day | ORAL | Status: DC

## 2012-06-23 NOTE — Progress Notes (Signed)
Patient ID: Aaron Dennis, male   DOB: September 25, 1955, 57 y.o.   MRN: [id number] CC: persistent pain  HPI: Pt very frustrated because he continues to have pain in right side and abdomen and lower back.  He was recently sent for CT scan and results reviewed with him today. No kidney stone was seen.  He is still constipated and not having regular BMs.  He is not reporting any weight loss.  No nausea or vomiting or fever or chills.  The patient is reporting that he would like to see a specialist because we have not found his problem.  He has had multiple imaging studies and I reviewed with him today. He says that pain meds are not really helping and he is not taking them regularly anymore.   No Known Allergies Past Medical History  Diagnosis Date  . Hypertension    Current Outpatient Prescriptions on File Prior to Visit  Medication Sig Dispense Refill  . magnesium citrate solution Take 296 mLs by mouth once.  300 mL  0  . Naphazoline HCl (CLEAR EYES OP) Apply 1 drop to eye daily as needed. For dry eyes      . olmesartan (BENICAR) 40 MG tablet Take 1 tablet (40 mg total) by mouth daily.  30 tablet  11  . sennosides-docusate sodium (SENOKOT-S) 8.6-50 MG tablet Take 2 tablets by mouth daily. FOR constipation  60 tablet  11   No current facility-administered medications on file prior to visit.   No family history on file. History   Social History  . Marital Status: Married    Spouse Name: N/A    Number of Children: N/A  . Years of Education: N/A   Occupational History  . Not on file.   Social History Main Topics  . Smoking status: Never Smoker   . Smokeless tobacco: Not on file  . Alcohol Use: No  . Drug Use: No  . Sexually Active: Not on file   Other Topics Concern  . Not on file   Social History Narrative  . No narrative on file    Review of Systems  Constitutional: Negative for fever, chills, diaphoresis, activity change, appetite change and fatigue. Not sleeping well  HENT:  Negative for ear pain, nosebleeds, congestion, facial swelling, rhinorrhea, neck pain, neck stiffness and ear discharge.   Eyes: Negative for pain, discharge, redness, itching and visual disturbance.  Respiratory: Negative for cough, choking, chest tightness, shortness of breath, wheezing and stridor.   Cardiovascular: Negative for chest pain, palpitations and leg swelling.  Gastrointestinal: constipation Negative for abdominal distention.  Genitourinary: Negative for dysuria, urgency, frequency, hematuria, flank pain, decreased urine volume, difficulty urinating and dyspareunia.  Musculoskeletal: positive for chronic  back pain, right side pain with certain movements, NO joint swelling, arthralgias and gait problem.  Neurological: Negative for dizziness, tremors, seizures, syncope, facial asymmetry, speech difficulty, weakness, light-headedness, numbness and headaches.  Hematological: Negative for adenopathy. Does not bruise/bleed easily.  Psychiatric/Behavioral: Negative for hallucinations, behavioral problems, confusion, dysphoric mood, decreased concentration and agitation.    Objective:   Filed Vitals:   06/23/12 1258  BP: 105/75  Pulse: 72  Temp: 97.4 F (36.3 C)  Resp: 18    Physical Exam  Constitutional: Appears well-developed and well-nourished. No distress.  HENT: Normocephalic. External right and left ear normal. Oropharynx is clear and moist.  Eyes: Conjunctivae and EOM are normal. PERRLA, no scleral icterus.  Neck: Normal ROM. Neck supple. No JVD. No tracheal deviation. No thyromegaly.  CVS: RRR, S1/S2 +, no murmurs, no gallops, no carotid bruit.  Pulmonary: Effort and breath sounds normal, no stridor, rhonchi, wheezes, rales. Mild right post chest wall pain in muscles  Abdominal: Soft. BS +,  no distension, mild tenderness RUQ, no rebound or guarding.  Musculoskeletal: Normal range of motion. No edema and mild tenderness noted in paraspinal muscles of thoracolumbar spine  worse on right side with deep palpation.  Lymphadenopathy: No lymphadenopathy noted, cervical, inguinal. Neuro: Alert. Normal reflexes, muscle tone coordination. No cranial nerve deficit. Skin: Skin is warm and dry. No rash noted. Not diaphoretic. No erythema. No pallor.  Psychiatric: Normal mood and affect. Behavior, judgment, thought content normal.   Lab Results  Component Value Date   WBC 7.0 03/13/2009   HGB 16.5 03/13/2009   HCT 49.8 03/13/2009   MCV 82.2 03/13/2009   PLT 200 03/13/2009   Lab Results  Component Value Date   CREATININE 1.03 05/24/2012   BUN 13 05/24/2012   NA 139 05/24/2012   K 3.7 05/24/2012   CL 102 05/24/2012   CO2 29 05/24/2012    No results found for this basename: HGBA1C     Assessment:   Patient Active Problem List   Diagnosis Date Noted  . Chronic abdominal pain 06/24/2012  . Right-sided chest wall pain 06/24/2012  . Constipation, chronic 06/24/2012  . Hyperbilirubinemia 06/24/2012  . Chronic back pain 06/24/2012  . DDD (degenerative disc disease), thoracolumbar 06/24/2012  . History of nephrolithiasis 06/24/2012  . CAD (coronary artery disease) 06/24/2012       Plan:     Constipation - start on Miralax BID, stop narcotics  Back pain  Chronic - recommended pt to stop narcotics and take tylenol as needed   Side pain chronic, abd pain, chronic, - recommend GI referral for further eval, reviewed CT scan results with patient, check more labs today  CAD (coronary artery disease) of artery bypass graft - Plan: Ambulatory referral to Sports Medicine, Ambulatory referral to Cardiology  Back pain - Plan: Ambulatory referral to Sports Medicine, Ambulatory referral to Cardiology  Side pain - Plan: Ambulatory referral to Sports Medicine, Ambulatory referral to Cardiology  The patient was given clear instructions to go to ER or return to medical center if symptoms don't improve, worsen or new problems develop.  The patient verbalized understanding.  The patient  was told to call to get lab results if they haven't heard anything in the next week.    RTC in 1 month  Results for orders placed in visit on 06/23/12  COMPREHENSIVE METABOLIC PANEL      Result Value Range   Sodium 140  135 - 145 mEq/L   Potassium 4.3  3.5 - 5.3 mEq/L   Chloride 106  96 - 112 mEq/L   CO2 26  19 - 32 mEq/L   Glucose, Bld 85  70 - 99 mg/dL   BUN 11  6 - 23 mg/dL   Creat 4.54  0.98 - 1.19 mg/dL   Total Bilirubin 2.0 (*) 0.3 - 1.2 mg/dL   Alkaline Phosphatase 131 (*) 39 - 117 U/L   AST 21  0 - 37 U/L   ALT 20  0 - 53 U/L   Total Protein 7.3  6.0 - 8.3 g/dL   Albumin 4.5  3.5 - 5.2 g/dL   Calcium 9.3  8.4 - 14.7 mg/dL  LIPID PANEL      Result Value Range   Cholesterol 166  0 - 200 mg/dL  Triglycerides 98  <150 mg/dL   HDL 27 (*) >40 mg/dL   Total CHOL/HDL Ratio 6.1     VLDL 20  0 - 40 mg/dL   LDL Cholesterol 102 (*) 0 - 99 mg/dL  SEDIMENTATION RATE      Result Value Range   Sed Rate 1  0 - 16 mm/hr    Rodney Langton, MD, CDE, FAAFP [facility] [employer] [place], [place]

## 2012-06-23 NOTE — Patient Instructions (Signed)
Go to sports medicine for evaluation of back and side pain  Back Pain, Adult Low back pain is very common. About 1 in 5 people have back pain.The cause of low back pain is rarely dangerous. The pain often gets better over time.About half of people with a sudden onset of back pain feel better in just 2 weeks. About 8 in 10 people feel better by 6 weeks.  CAUSES Some common causes of back pain include:  Strain of the muscles or ligaments supporting the spine.  Wear and tear (degeneration) of the spinal discs.  Arthritis.  Direct injury to the back. DIAGNOSIS Most of the time, the direct cause of low back pain is not known.However, back pain can be treated effectively even when the exact cause of the pain is unknown.Answering your caregiver's questions about your overall health and symptoms is one of the most accurate ways to make sure the cause of your pain is not dangerous. If your caregiver needs more information, he or she may order lab work or imaging tests (X-rays or MRIs).However, even if imaging tests show changes in your back, this usually does not require surgery. HOME CARE INSTRUCTIONS For many people, back pain returns.Since low back pain is rarely dangerous, it is often a condition that people can learn to [facility] their own.   Remain active. It is stressful on the back to sit or stand in one place. Do not sit, drive, or stand in one place for more than 30 minutes at a time. Take short walks on level surfaces as soon as pain allows.Try to increase the length of time you walk each day.  Do not stay in bed.Resting more than 1 or 2 days can delay your recovery.  Do not avoid exercise or work.Your body is made to move.It is not dangerous to be active, even though your back may hurt.Your back will likely heal faster if you return to being active before your pain is gone.  Pay attention to your body when you bend and lift. Many people have less discomfortwhen lifting if  they bend their knees, keep the load close to their bodies,and avoid twisting. Often, the most comfortable positions are those that put less stress on your recovering back.  Find a comfortable position to sleep. Use a firm mattress and lie on your side with your knees slightly bent. If you lie on your back, put a pillow under your knees.  Only take over-the-counter or prescription medicines as directed by your caregiver. Over-the-counter medicines to reduce pain and inflammation are often the most helpful.Your caregiver may prescribe muscle relaxant drugs.These medicines help dull your pain so you can more quickly return to your normal activities and healthy exercise.  Put ice on the injured area.  Put ice in a plastic bag.  Place a towel between your skin and the bag.  Leave the ice on for 15 to 20 minutes, 3 to 4 times a day for the first 2 to 3 days. After that, ice and heat may be alternated to reduce pain and spasms.  Ask your caregiver about trying back exercises and gentle massage. This may be of some benefit.  Avoid feeling anxious or stressed.Stress increases muscle tension and can worsen back pain.It is important to recognize when you are anxious or stressed and learn ways to manage it.Exercise is a great option. SEEK MEDICAL CARE IF:  You have pain that is not relieved with rest or medicine.  You have pain that does not improve  in 1 week.  You have new symptoms.  You are generally not feeling well. SEEK IMMEDIATE MEDICAL CARE IF:   You have pain that radiates from your back into your legs.  You develop new bowel or bladder control problems.  You have unusual weakness or numbness in your arms or legs.  You develop nausea or vomiting.  You develop abdominal pain.  You feel faint. Document Released: 01/26/2005 Document Revised: 07/28/2011 Document Reviewed: 06/16/2010 [facility] Patient Information 2013 [place], [place].  Coronary Artery Disease Coronary artery  disease (CAD) is a process in which the heart (coronary) arteries narrow or become blocked from the development of atherosclerosis. Atherosclerosis is a disease in which plaque builds up on the inside of the heart arteries (coronary arteries). Plaque is made up of fats (lipids), cholesterol, calcium, and fibrous tissue. CAD can lead to a heart attack (myocardial infarction, MI). An MI can lead to heart failure, cardiogenic shock, or sudden cardiac death. CAD can cause an MI through:  Plaque buildup that can severely narrow or block the coronary arteries and diminish blood flow.  Plaque that can become unstable and "rupture." Unstable plaque that ruptures within a coronary artery can form a clot and cause a sudden (acute) blockage. RISK FACTORS Many risk factors contribute to the development of CAD. These include:  High cholesterol (dyslipidemia) levels.  High blood pressure (hypertension).  Smoking.  Diabetes.  Age.  Gender. Men can develop CAD earlier in life than women.  Family history.  Inactivity or lack of regular physical or aerobic exercise.  A diet high in saturated fats.  Chronic kidney disease. SYMPTOMS  When a coronary artery is narrowed or blocked, an MI can occur. MI symptoms can include:  Chest pain (agina). Angina can occur by itself or it can also occur with pain in the neck, arm, jaw, or in the upper, middle back (mid-scapular pain).  Profuse sweating (diaphoresis) without physical activity or movement.  Shortness of breath (dyspnea).  Irregular heartbeats (palpitations) that feel like your heart is skipping beats or is beating very fast.  Nausea.  Epigastric pain. Epigastric pain may occur as "heartburn."  Tiredness (malaise). This can especially be present in the elderly. Women can have different (atypical) symptoms other than classic angina.  DIAGNOSIS  The diagnosis of CAD may include:  An electrocardiography (ECG). An ECG does not diagnose CAD, but  it is usefull in the detection of a sudden (acute) MI or as a marker for a previous MI. Depending on which heart (coronary) artery may be blocked, an ECG may not pick up an MI pattern.  Exercise stress test. A stress test can be performed at rest for people who are unable to do an exercise stress test. A stress test will only be abnormal if one or more of the large coronary arteries is significantly blocked.  Blood tests. Tests may include samples to detect heart muscle damage (such as troponin levels). Other tests may include cholesterol checks and an inflammation test (high-sensitivity C-reactive protein, hs-CRP).  Coronary angiography.  Screening people who have peripheral vascular disease (PAD). These people often times have CAD. TREATMENT  The treatment of CAD includes the following:  Lifestyle changes such as:  Following a heart-healthy diet. A registered dietitian can you help educate you on healthy food options and changes.  Quiting smoking.  Following an exercise program approved by your caregiver.  Maintaining a healthy weight. Lose weight as approved by your caregiver.  Medicines to help control your blood pressure, cholesterol  level, angina, and blood clotting. Medicines may include beta-blockers, ACE inhibitors, statins, nitrates, and anti-platelet medicines.  If you have a heart stent and are taking anti-platelet medicine, it is important to not suddenly stop taking this medicine. Suddenly stopping anti-platelet medicine can result in an MI. Talk with your caregiver before stopping medicine or if you cannot afford your medicine.  If the coronary arteries are significantly blocked, surgery may be needed. This can include:  Percutaneous coronary intervention (PCI) with or without stent placement.  Coronary artery bypass graft surgery (CABG). SEEK IMMEDIATE MEDICAL CARE IF:   You develop MI symptoms. This is a medical emergency. Get help at once. Call your local emergency  service (911 in the [place] immediately. Do not drive yourself to the clinic or hospital. MI symptoms can include:  Angina or pain that occurs in the neck, arm, jaw, or in the upper middle back.  Profuse sweating without cause.  Shortness of breath or difficulty breathing without cause.  Unexplained nausea or epigastric pain that feels like heartburn. Document Released: 04/20/2011 Document Reviewed: 04/20/2011 [facility] Patient Information 2013 [place], [place].  Constipation, Adult Constipation is when a person has fewer than 3 bowel movements a week; has difficulty having a bowel movement; or has stools that are dry, hard, or larger than normal. As people grow older, constipation is more common. If you try to fix constipation with medicines that make you have a bowel movement (laxatives), the problem may get worse. Long-term laxative use may cause the muscles of the colon to become weak. A low-fiber diet, not taking in enough fluids, and taking certain medicines may make constipation worse. CAUSES   Certain medicines, such as antidepressants, pain medicine, iron supplements, antacids, and water pills.   Certain diseases, such as diabetes, irritable bowel syndrome (IBS), thyroid disease, or depression.   Not drinking enough water.   Not eating enough fiber-rich foods.   Stress or travel.  Lack of physical activity or exercise.  Not going to the restroom when there is the urge to have a bowel movement.  Ignoring the urge to have a bowel movement.  Using laxatives too much. SYMPTOMS   Having fewer than 3 bowel movements a week.   Straining to have a bowel movement.   Having hard, dry, or larger than normal stools.   Feeling full or bloated.   Pain in the lower abdomen.  Not feeling relief after having a bowel movement. DIAGNOSIS  Your caregiver will take a medical history and perform a physical exam. Further testing may be done for severe constipation. Some tests may  include:   A barium enema X-ray to examine your rectum, colon, and sometimes, your small intestine.  A sigmoidoscopy to examine your lower colon.  A colonoscopy to examine your entire colon. TREATMENT  Treatment will depend on the severity of your constipation and what is causing it. Some dietary treatments include drinking more fluids and eating more fiber-rich foods. Lifestyle treatments may include regular exercise. If these diet and lifestyle recommendations do not help, your caregiver may recommend taking over-the-counter laxative medicines to help you have bowel movements. Prescription medicines may be prescribed if over-the-counter medicines do not work.  HOME CARE INSTRUCTIONS   Increase dietary fiber in your diet, such as fruits, vegetables, whole grains, and beans. Limit high-fat and processed sugars in your diet, such as [place] fries, hamburgers, cookies, candies, and soda.   A fiber supplement may be added to your diet if you cannot get enough fiber from  foods.   Drink enough fluids to keep your urine clear or pale yellow.   Exercise regularly or as directed by your caregiver.   Go to the restroom when you have the urge to go. Do not hold it.  Only take medicines as directed by your caregiver. Do not take other medicines for constipation without talking to your caregiver first. SEEK IMMEDIATE MEDICAL CARE IF:   You have bright red blood in your stool.   Your constipation lasts for more than 4 days or gets worse.   You have abdominal or rectal pain.   You have thin, pencil-like stools.  You have unexplained weight loss. MAKE SURE YOU:   Understand these instructions.  Will watch your condition.  Will get help right away if you are not doing well or get worse. Document Released: 10/25/2003 Document Revised: 04/20/2011 Document Reviewed: 12/30/2010 [facility] Patient Information 2013 [place], [place].

## 2012-06-24 DIAGNOSIS — I2510 Atherosclerotic heart disease of native coronary artery without angina pectoris: Secondary | ICD-10-CM | POA: Insufficient documentation

## 2012-06-24 DIAGNOSIS — R109 Unspecified abdominal pain: Secondary | ICD-10-CM | POA: Insufficient documentation

## 2012-06-24 DIAGNOSIS — Z87442 Personal history of urinary calculi: Secondary | ICD-10-CM | POA: Insufficient documentation

## 2012-06-24 DIAGNOSIS — M545 Low back pain, unspecified: Secondary | ICD-10-CM | POA: Insufficient documentation

## 2012-06-24 DIAGNOSIS — R0789 Other chest pain: Secondary | ICD-10-CM | POA: Insufficient documentation

## 2012-06-24 DIAGNOSIS — K5909 Other constipation: Secondary | ICD-10-CM | POA: Insufficient documentation

## 2012-06-24 DIAGNOSIS — G8929 Other chronic pain: Secondary | ICD-10-CM | POA: Insufficient documentation

## 2012-06-24 DIAGNOSIS — I25810 Atherosclerosis of coronary artery bypass graft(s) without angina pectoris: Secondary | ICD-10-CM | POA: Insufficient documentation

## 2012-06-24 DIAGNOSIS — M5135 Other intervertebral disc degeneration, thoracolumbar region: Secondary | ICD-10-CM | POA: Insufficient documentation

## 2012-06-25 NOTE — Progress Notes (Signed)
Quick Note:  Please inform patient that his bilirubin level was elevated. He needs to get a fractionated bilirubin blood test done. His other labs came back OK. We will check the fractionated bilirubin when he returns for follow up. He is being referred to a gastroenterologist for further evaluation.   Rodney Langton, MD, CDE, FAAFP [facility] [place], [place]   ______

## 2012-06-27 ENCOUNTER — Telehealth: Payer: Self-pay

## 2012-06-27 ENCOUNTER — Ambulatory Visit (INDEPENDENT_AMBULATORY_CARE_PROVIDER_SITE_OTHER): Payer: No Typology Code available for payment source | Admitting: Family Medicine

## 2012-06-27 ENCOUNTER — Encounter: Payer: Self-pay | Admitting: Family Medicine

## 2012-06-27 VITALS — BP 122/78 | HR 80 | Ht 70.0 in | Wt 178.0 lb

## 2012-06-27 DIAGNOSIS — M48062 Spinal stenosis, lumbar region with neurogenic claudication: Secondary | ICD-10-CM

## 2012-06-27 DIAGNOSIS — M549 Dorsalgia, unspecified: Secondary | ICD-10-CM

## 2012-06-27 MED ORDER — GABAPENTIN 300 MG PO CAPS: ORAL_CAPSULE | ORAL | Status: DC

## 2012-06-27 NOTE — Telephone Encounter (Signed)
Patient is aware of his labwork Will do fractionated billirubin at his follow up in june

## 2012-06-27 NOTE — Patient Instructions (Addendum)
I have reviewed your MRI of her back and her CT of her abdomen. The findings that were concerning use such as the Tarlov cyst in the back, or hemangioma in the back, and the atherosclerotic changes in her arteries are all benign findings. This seems that they are not likely because unique problems and they're not the source of your problems with pain in the back and pain in her leg now. I think the pain in her leg is from some mild spinal stenosis in her lumbar spine. There's not a really great surgery for that so what I hope to do is to control her symptoms with a medicine called gabapentin. We will start you have one tablet per night for 4-5 days and then increase it to 2 tablets at night. Off IV taking 2 tablets at night for 5 days and then if you are not having dizziness or excessive sleepiness then I will have you increase to 3 tablets at night. I would like to see you back in 2-3 weeks and we'll see how you're doing.  Regarding the right flank pain, I think that is from muscle weakness and tightness in your muscle core. We are going to show you some exercises today which I want you to do at least twice a day. I expect you to have some mild improvement by the time he come back. It will take several months to get this completely worked out. His cost if you have questions in the interim.

## 2012-06-28 ENCOUNTER — Encounter: Payer: Self-pay | Admitting: Family Medicine

## 2012-06-28 NOTE — Progress Notes (Signed)
  Subjective:    Patient ID: Aaron Dennis, male    DOB: [birth year], 57 y.o.   MRN: [id number]  HPI  Complaining of bilateral flank pain that is chronic. He has been worked up by his primary care provider for kidney stone, abdominal pathology, low back pathology. He said CT abdomen pelvis and MRI of his back. He drinks discs with him. #2. Pain radiating from the low back down to the top part of his right foot and also encompasses the medial plantar surface of his foot. This is aching and burning in nature. Feels like his foot is fat. This is been going on several months. No specific injury.  Review of Systems No unusual weight gain or loss, no fever, sweats, chills. No rash.    Objective:   Physical Exam  Vital signs are reviewed GENERAL: Well-developed male no acute distress BACK: No defect. Areas of tenderness or in the lumbar musculature extending down to the PSIS bilaterally. The SI joints are nontender. He has normal flexion at the hips. Normal straight leg raise bilaterally in seated and supine position. MSK: Lower extremity strength 5 out of 5. Muscle bulk is normal. NEURO: DTRs 2+ bilaterally equal knee and ankle. IMAGING: Reviewed CT abdomen and pelvis which showed a nonobstructing kidney stone and MRI back which shows question of some early spinal stenosis with some lateral recess crowding.     Assessment & Plan:  #1. Low back pain that is multifactorial. For the flank pain I think he needs a home exercise program which we gave him in demonstrate today #2. For his radicular pain/numbness/paresthesias, I think this is coming from some narrowing in the lateral recess area. I would start him on gabapentin and see him back in 2 weeks #3. Also notably on his problem list was coronary artery disease with second diagnosis of CABG. Aspect of his chest today, and he has no scar. He adamantly tells me that he has never had a CABG her any type of heart procedure. In fact he says he is only  stayed in the hospital overnight once in his entire life. I changed his in his record.

## 2012-07-01 ENCOUNTER — Telehealth: Payer: Self-pay | Admitting: *Deleted

## 2012-07-01 NOTE — Telephone Encounter (Signed)
Pt received a call from [place], would like call back.  Probably to do with results of lab.

## 2012-07-01 NOTE — Telephone Encounter (Signed)
07/01/12 Patient stated he spoke with Arna Medici on 06/30/12 and everything is cleared up. P.Neriyah Cercone,RN

## 2012-07-11 ENCOUNTER — Ambulatory Visit: Payer: No Typology Code available for payment source | Admitting: Family Medicine

## 2012-07-11 DIAGNOSIS — R945 Abnormal results of liver function studies: Secondary | ICD-10-CM | POA: Insufficient documentation

## 2012-07-15 ENCOUNTER — Ambulatory Visit: Payer: No Typology Code available for payment source | Admitting: Family Medicine

## 2012-07-18 ENCOUNTER — Ambulatory Visit (INDEPENDENT_AMBULATORY_CARE_PROVIDER_SITE_OTHER): Payer: No Typology Code available for payment source | Admitting: Family Medicine

## 2012-07-18 VITALS — BP 109/71 | Ht 70.0 in | Wt 178.0 lb

## 2012-07-18 DIAGNOSIS — M5135 Other intervertebral disc degeneration, thoracolumbar region: Secondary | ICD-10-CM

## 2012-07-18 DIAGNOSIS — M549 Dorsalgia, unspecified: Secondary | ICD-10-CM

## 2012-07-18 DIAGNOSIS — G8929 Other chronic pain: Secondary | ICD-10-CM

## 2012-07-18 DIAGNOSIS — IMO0002 Reserved for concepts with insufficient information to code with codable children: Secondary | ICD-10-CM

## 2012-07-19 ENCOUNTER — Encounter: Payer: Self-pay | Admitting: Family Medicine

## 2012-07-19 NOTE — Progress Notes (Signed)
  Subjective:    Patient ID: Aaron Dennis, male    DOB: [birth year], 57 y.o.   MRN: [id number]  HPI Followup mid back, low back, leg and foot pain. Last office visit we reviewed his workup prior to being seen. Restart him on gabapentin and he is tapered at 900 mg a day.  He is quite unhappy, says the pain is absolutely not changed. He wants to know what is causing all this and while he can get to the source of it. He does not want to continue taper of the gabapentin or try any other medication.   Review of Systems No new symptoms.    Objective:   Physical Exam  Vital signs are reviewed GENERAL: Well-developed male no acute distress Psychiatric: Angry bit asks and answers questions appropriately. He is not agitated.      Assessment & Plan:  #1. Chronic mid and low back pain with paresthesias down to the dorsum of the right foot. Again discussed options with him. His workup has been fairly complete prior to his referral to sports medicine. It included a CT of the pelvis, ultrasound kidneys, MRI lumbar spine. He has some degenerative change on the MRI of the spine but there is no nerve root encroachment, no spinal stenosis, and pathology that would coincide with his symptoms. It does sound neuropathic in I think the best option would be to taper the Neurontin up to a higher dose. Discussed that at some length but ultimately he decided not to do that. Overturn into the care of his PCP. Would be happy to see him at any time although I'm not sure what additional we can offer him. One possibility would be epidural injections of steroids or nerve block.

## 2012-07-22 ENCOUNTER — Encounter: Payer: Self-pay | Admitting: Internal Medicine

## 2012-07-22 ENCOUNTER — Ambulatory Visit: Payer: No Typology Code available for payment source | Attending: Family Medicine | Admitting: Internal Medicine

## 2012-07-22 VITALS — BP 112/74 | HR 77 | Temp 97.9°F | Resp 16 | Ht 70.0 in | Wt 177.0 lb

## 2012-07-22 DIAGNOSIS — M549 Dorsalgia, unspecified: Secondary | ICD-10-CM

## 2012-07-22 DIAGNOSIS — M546 Pain in thoracic spine: Secondary | ICD-10-CM | POA: Insufficient documentation

## 2012-07-22 MED ORDER — CELECOXIB 200 MG PO CAPS: 200.0000 mg | ORAL_CAPSULE | Freq: Two times a day (BID) | ORAL | Status: DC

## 2012-07-22 NOTE — Progress Notes (Signed)
Patient ID: [name], male   DOB: 03-02-1955, 57 y.o.   MRN: [id number]  Patient Demographics  Aaron Dennis, is a 57 y.o. male  JYN:[id number]  [id number]  DOB - 06/10/1955  Chief Complaint  Patient presents with  . Follow-up        Subjective:   Aaron Dennis today has, No headache, No chest pain, No abdominal pain - No Nausea, No new weakness tingling or numbness, No Cough - SOB. Comes in for continued upper back pain, says Neurontin is causing sedation and no relief in his pain, no fever chills, no focal weakness. His back pain is nonradiating, no aggravating relieving factors.  Objective:   Past Medical History  Diagnosis Date  . Hypertension       History reviewed. No pertinent past surgical history.   Filed Vitals:   07/22/12 1255  BP: 112/74  Pulse: 77  Temp: 97.9 F (36.6 C)  TempSrc: Oral  Resp: 16  Height: 5\' 10"  (1.778 m)  Weight: 177 lb (80.287 kg)  SpO2: 98%     Exam  Awake Alert, Oriented X 3, No new F.N deficits, Normal affect Swisher.AT,PERRAL Supple Neck,No JVD, No cervical lymphadenopathy appriciated.  Symmetrical Chest wall movement, Good air movement bilaterally, CTAB RRR,No Gallops,Rubs or new Murmurs, No Parasternal Heave +ve B.Sounds, Abd Soft, Non tender, No organomegaly appriciated, No rebound - guarding or rigidity. No Cyanosis, Clubbing or edema, No new Rash or bruise ,  Some tenderness over the spine area    Data Review   CBC No results found for this basename: WBC, HGB, HCT, PLT, MCV, MCH, MCHC, RDW, NEUTRABS, LYMPHSABS, MONOABS, EOSABS, BASOSABS, BANDABS, BANDSABD,  in the last 168 hours  Chemistries   No results found for this basename: NA, K, CL, CO2, GLUCOSE, BUN, CREATININE, GFRCGP, CALCIUM, MG, AST, ALT, ALKPHOS, BILITOT,  in the last 168 hours ------------------------------------------------------------------------------------------------------------------ No results found for this basename: HGBA1C,  in the last 72  hours ------------------------------------------------------------------------------------------------------------------ No results found for this basename: CHOL, HDL, LDLCALC, TRIG, CHOLHDL, LDLDIRECT,  in the last 72 hours ------------------------------------------------------------------------------------------------------------------ No results found for this basename: TSH, T4TOTAL, FREET3, T3FREE, THYROIDAB,  in the last 72 hours ------------------------------------------------------------------------------------------------------------------ No results found for this basename: VITAMINB12, FOLATE, FERRITIN, TIBC, IRON, RETICCTPCT,  in the last 72 hours  Coagulation profile  No results found for this basename: INR, PROTIME,  in the last 168 hours     Prior to Admission medications   Medication Sig Start Date End Date Taking? Authorizing Provider  benazepril (LOTENSIN) 20 MG tablet Take 1 tablet (20 mg total) by mouth daily. 06/23/12  Yes Clanford Cyndie Mull, MD  magnesium citrate solution Take 296 mLs by mouth once. 06/08/12  Yes Edsel Petrin, DO  celecoxib (CELEBREX) 200 MG capsule Take 1 capsule (200 mg total) by mouth 2 (two) times daily. 07/22/12   Leroy Sea, MD  Naphazoline HCl (CLEAR EYES OP) Apply 1 drop to eye daily as needed. For dry eyes    Historical Provider, MD  polyethylene glycol powder (GLYCOLAX/MIRALAX) powder Take 17 g by mouth 2 (two) times daily as needed. 06/23/12   Clanford Cyndie Mull, MD  sennosides-docusate sodium (SENOKOT-S) 8.6-50 MG tablet Take 2 tablets by mouth daily. FOR constipation 06/08/12   Edsel Petrin, DO     Assessment & Plan   Chronic thoracic spine back pain radiating to the right side, reviewed his recent CT scan of his abdomen and pelvis was unremarkable and MRI L-spine, mild DJD however at  the L-spine area, I will place him on Celebrex, he is seeing sports medicine and was placed on Neurontin without any benefit. Neurontin is  causing sedation it'll be stopped. I have referred him to orthopedics for further evaluation.       Leroy Sea M.D on 07/22/2012 at 1:02 PM

## 2012-07-22 NOTE — Progress Notes (Signed)
Sates that he is here because of his back pain medication, Gabapentin, prescribed by orthopedist on Monday. Sates the medication knocks him out at night and his back is much worse.

## 2012-07-26 ENCOUNTER — Telehealth: Payer: Self-pay | Admitting: Family Medicine

## 2012-07-26 ENCOUNTER — Telehealth: Payer: Self-pay | Admitting: *Deleted

## 2012-07-26 MED ORDER — MELOXICAM 7.5 MG PO TABS: 7.5000 mg | ORAL_TABLET | Freq: Every day | ORAL | Status: DC

## 2012-07-26 NOTE — Telephone Encounter (Signed)
Pt says Celebrex is too expensive and would like alternate script.

## 2012-07-26 NOTE — Telephone Encounter (Signed)
He can take over the counter naproxen, 500mg  twice daily, instead of the celebrex. He should be sure to f/u with ortho.

## 2012-07-26 NOTE — Telephone Encounter (Signed)
07/26/12 Patient made aware per Dr.Wood to take over the counter naproxen 500mg  po twice  Daily.Patient verbalize and understands. P.Peachie Barkalow,RN BSN [facility]

## 2012-07-27 ENCOUNTER — Ambulatory Visit: Payer: No Typology Code available for payment source

## 2012-08-15 ENCOUNTER — Ambulatory Visit: Payer: No Typology Code available for payment source | Attending: Family Medicine | Admitting: Internal Medicine

## 2012-08-15 VITALS — BP 120/79 | HR 73 | Temp 97.6°F | Resp 17

## 2012-08-15 DIAGNOSIS — Z5987 Material hardship due to limited financial resources, not elsewhere classified: Secondary | ICD-10-CM | POA: Insufficient documentation

## 2012-08-15 DIAGNOSIS — Z5989 Other problems related to housing and economic circumstances: Secondary | ICD-10-CM | POA: Insufficient documentation

## 2012-08-15 DIAGNOSIS — G8929 Other chronic pain: Secondary | ICD-10-CM | POA: Insufficient documentation

## 2012-08-15 DIAGNOSIS — R109 Unspecified abdominal pain: Secondary | ICD-10-CM | POA: Insufficient documentation

## 2012-08-15 DIAGNOSIS — Z598 Other problems related to housing and economic circumstances: Secondary | ICD-10-CM | POA: Insufficient documentation

## 2012-08-15 DIAGNOSIS — IMO0001 Reserved for inherently not codable concepts without codable children: Secondary | ICD-10-CM | POA: Insufficient documentation

## 2012-08-15 DIAGNOSIS — M79671 Pain in right foot: Secondary | ICD-10-CM

## 2012-08-15 DIAGNOSIS — M79609 Pain in unspecified limb: Secondary | ICD-10-CM | POA: Insufficient documentation

## 2012-08-15 DIAGNOSIS — I10 Essential (primary) hypertension: Secondary | ICD-10-CM | POA: Insufficient documentation

## 2012-08-15 MED ORDER — TROLAMINE SALICYLATE 10 % EX CREA: TOPICAL_CREAM | CUTANEOUS | Status: DC | PRN

## 2012-08-15 MED ORDER — MELOXICAM 15 MG PO TABS: 15.0000 mg | ORAL_TABLET | Freq: Every day | ORAL | Status: DC

## 2012-08-15 MED ORDER — CYCLOBENZAPRINE HCL 10 MG PO TABS: 10.0000 mg | ORAL_TABLET | Freq: Every day | ORAL | Status: DC

## 2012-08-15 NOTE — Patient Instructions (Signed)
RICE: Routine Care for Injuries  The routine care of many injuries includes Rest, Ice, Compression, and Elevation (RICE).  HOME CARE INSTRUCTIONS   Rest is needed to allow your body to heal. Routine activities can usually be resumed when comfortable. Injured tendons and bones can take up to 6 weeks to heal. Tendons are the cord-like structures that attach muscle to bone.   Ice following an injury helps keep the swelling down and reduces pain.   Put ice in a plastic bag.   Place a towel between your skin and the bag.   Leave the ice on for 15-20 minutes, 3-4 times a day. Do this while awake, for the first 24 to 48 hours. After that, continue as directed by your caregiver.   Compression helps keep swelling down. It also gives support and helps with discomfort. If an elastic bandage has been applied, it should be removed and reapplied every 3 to 4 hours. It should not be applied tightly, but firmly enough to keep swelling down. Watch fingers or toes for swelling, bluish discoloration, coldness, numbness, or excessive pain. If any of these problems occur, remove the bandage and reapply loosely. Contact your caregiver if these problems continue.   Elevation helps reduce swelling and decreases pain. With extremities, such as the arms, hands, legs, and feet, the injured area should be placed near or above the level of the heart, if possible.  SEEK IMMEDIATE MEDICAL CARE IF:   You have persistent pain and swelling.   You develop redness, numbness, or unexpected weakness.   Your symptoms are getting worse rather than improving after several days.  These symptoms may indicate that further evaluation or further X-rays are needed. Sometimes, X-rays may not show a small broken bone (fracture) until 1 week or 10 days later. Make a follow-up appointment with your caregiver. Ask when your X-ray results will be ready. Make sure you get your X-ray results.  Document Released: 05/10/2000 Document Revised: 04/20/2011 Document  Reviewed: 06/27/2010  Exit[employer] Patient Information 2014 [employer].

## 2012-08-15 NOTE — Progress Notes (Signed)
Patient here for follow up Needs medications refilled

## 2012-08-15 NOTE — Progress Notes (Signed)
Patient ID: Aaron Dennis, male   DOB: Jul 06, 1955, 57 y.o.   MRN: [id number]  CC:  HPI: This is a 7 who presents with chronic issues of pain in his right flank and dorsum of right foot. He states that he was previously prescribed Celebrex when he was here but is not able to afford it as it caused $200 a month. He continues to take his Mobic at 7.5 mg prior to bedtime but continues to have pain through the night. Pain is worse when he lays in bed rather than during the day. No Known Allergies Past Medical History  Diagnosis Date  . Hypertension    Current Outpatient Prescriptions on File Prior to Visit  Medication Sig Dispense Refill  . benazepril (LOTENSIN) 20 MG tablet Take 1 tablet (20 mg total) by mouth daily.  30 tablet  4  . magnesium citrate solution Take 296 mLs by mouth once.  300 mL  0  . Naphazoline HCl (CLEAR EYES OP) Apply 1 drop to eye daily as needed. For dry eyes      . polyethylene glycol powder (GLYCOLAX/MIRALAX) powder Take 17 g by mouth 2 (two) times daily as needed.  3350 g  3  . sennosides-docusate sodium (SENOKOT-S) 8.6-50 MG tablet Take 2 tablets by mouth daily. FOR constipation  60 tablet  11   No current facility-administered medications on file prior to visit.   History reviewed. No pertinent family history. History   Social History  . Marital Status: Married    Spouse Name: N/A    Number of Children: N/A  . Years of Education: N/A   Occupational History  . Not on file.   Social History Main Topics  . Smoking status: Never Smoker   . Smokeless tobacco: Not on file  . Alcohol Use: No  . Drug Use: No  . Sexually Active: Not on file   Other Topics Concern  . Not on file   Social History Narrative  . No narrative on file    Review of Systems ______ Constitutional: Negative for fever, chills, diaphoresis, activity change, appetite change and fatigue. ____ HENT: Negative for ear pain, nosebleeds, congestion, facial swelling, rhinorrhea, neck pain,  neck stiffness and ear discharge.  ____ Eyes: Negative for pain, discharge, redness, itching and visual disturbance. ____ Respiratory: Negative for cough, choking, chest tightness, shortness of breath, wheezing and stridor.  ____ Cardiovascular: Negative for chest pain, palpitations and leg swelling. ____ Gastrointestinal: Negative for Nausea/ Vomiting/ Diarrhea or Consitpation Genitourinary: Negative for dysuria, urgency, frequency, hematuria, flank pain, decreased urine volume, difficulty urinating and dyspareunia. ____ Musculoskeletal: Negative for back pain, joint swelling, arthralgias and gait problem. ________ Neurological: Negative for dizziness, tremors, seizures, syncope, facial asymmetry, speech difficulty, weakness, light-headedness, numbness and headaches. ____ Hematological: Negative for adenopathy. Does not bruise/bleed easily. ____ Psychiatric/Behavioral: Negative for hallucinations, behavioral problems, confusion, dysphoric mood, decreased concentration and agitation. ______   Objective:   Filed Vitals:   08/15/12 1306  BP: 120/79  Pulse: 73  Temp: 97.6 F (36.4 C)  Resp: 17    Physical Exam ______ Constitutional: Appears well-developed and well-nourished. No distress. ____ HENT: Normocephalic. External right and left ear normal. Oropharynx is clear and moist. ____ Eyes: Conjunctivae and EOM are normal. PERRLA, no scleral icterus. ____ Neck: Normal ROM. Neck supple. No JVD. No tracheal deviation. No thyromegaly. ____ CVS: RRR, S1/S2 +, no murmurs, no gallops, no carotid bruit.  Pulmonary: Effort and breath sounds normal, no stridor, rhonchi, wheezes, rales.  Abdominal:  Soft. BS +,  no distension, tenderness, rebound or guarding. ________ Musculoskeletal: Normal range of motion. Point tenderness in the right mid back just below the rib cage. Currently dorsum of right foot is nontender or swollen.  Lymphadenopathy: No lymphadenopathy noted, cervical, inguinal. Neuro:  Alert. Normal reflexes, muscle tone coordination. No cranial nerve deficit. Skin: Skin is warm and dry. No rash noted. Not diaphoretic. No erythema. No pallor. ____ Psychiatric: Normal mood and affect. Behavior, judgment, thought content normal. __  Lab Results  Component Value Date   WBC 7.0 03/13/2009   HGB 16.5 03/13/2009   HCT 49.8 03/13/2009   MCV 82.2 03/13/2009   PLT 200 03/13/2009   Lab Results  Component Value Date   CREATININE 1.07 06/23/2012   BUN 11 06/23/2012   NA 140 06/23/2012   K 4.3 06/23/2012   CL 106 06/23/2012   CO2 26 06/23/2012    No results found for this basename: HGBA1C   Lipid Panel     Component Value Date/Time   CHOL 166 06/23/2012 1414   TRIG 98 06/23/2012 1414   HDL 27* 06/23/2012 1414   CHOLHDL 6.1 06/23/2012 1414   VLDL 20 06/23/2012 1414   LDLCALC 119* 06/23/2012 1414       Assessment and plan:   Patient Active Problem List   Diagnosis Date Noted  . Chronic abdominal pain 06/24/2012  . Right-sided chest wall pain 06/24/2012  . Constipation, chronic 06/24/2012  . Hyperbilirubinemia 06/24/2012  . Chronic back pain 06/24/2012  . DDD (degenerative disc disease), thoracolumbar 06/24/2012  . History of nephrolithiasis 06/24/2012  . Side pain 06/24/2012  . Back pain 06/24/2012    #1 right sided flank/back pain and pain on dorsum of right foot-both chronic Is followed up with sports medicine and has tried multiple medications and exercise regimens without improvement. He's also had a CT scan of his back and an MRI which has been negative for any pathology. At this point I will increase his Mobic from 7.5-15 mg at bedtime and also add 10 mg of Flexeril to be taken at bedtime. He is explained this is chronic muscular pain and will be difficult to resolve completely but he should attempt to apply heat to it and try over-the-counter Aspercreme 3 times a day as well.  As far as the pain on the dorsum of his foot, he states that he needs a referral to podiatry and  therefore I will refer him.

## 2012-08-17 ENCOUNTER — Encounter: Payer: Self-pay | Admitting: Cardiovascular Disease

## 2012-08-17 ENCOUNTER — Ambulatory Visit (INDEPENDENT_AMBULATORY_CARE_PROVIDER_SITE_OTHER): Payer: No Typology Code available for payment source | Admitting: Cardiovascular Disease

## 2012-08-17 VITALS — BP 110/70 | HR 66 | Ht 70.0 in | Wt 174.0 lb

## 2012-08-17 DIAGNOSIS — I2584 Coronary atherosclerosis due to calcified coronary lesion: Secondary | ICD-10-CM

## 2012-08-17 DIAGNOSIS — I2510 Atherosclerotic heart disease of native coronary artery without angina pectoris: Secondary | ICD-10-CM | POA: Insufficient documentation

## 2012-08-17 MED ORDER — ATORVASTATIN CALCIUM 20 MG PO TABS: 20.0000 mg | ORAL_TABLET | Freq: Every day | ORAL | Status: DC

## 2012-08-17 NOTE — Progress Notes (Signed)
     Aaron Dennis Date of Birth  1955-07-12       [facility]    [place] [address]  [address], [address] [place], [place]  [address]   [place], [place]  [address] [phone]     [phone]   Fax  [phone]    Fax [phone]  Problem List: 1.  HTN 2. Coronary calcification (LAD)  History of Present Illness:  Aaron Dennis is a 57 yo without cardiac complaints.  [name]  He was noticed to have some calcifications of his LAD during a routine CT of hte chest and abdomen - which was done for flank pain.  He does not do any regular exercise.   Current Outpatient Prescriptions on File Prior to Visit  Medication Sig Dispense Refill  . benazepril (LOTENSIN) 20 MG tablet Take 1 tablet (20 mg total) by mouth daily.  30 tablet  4  . cyclobenzaprine (FLEXERIL) 10 MG tablet Take 1 tablet (10 mg total) by mouth at bedtime.  30 tablet  3  . meloxicam (MOBIC) 15 MG tablet Take 1 tablet (15 mg total) by mouth daily.  30 tablet  3  . Naphazoline HCl (CLEAR EYES OP) Apply 1 drop to eye daily as needed. For dry eyes      . polyethylene glycol powder (GLYCOLAX/MIRALAX) powder Take 17 g by mouth 2 (two) times daily as needed.  3350 g  3  . sennosides-docusate sodium (SENOKOT-S) 8.6-50 MG tablet Take 2 tablets by mouth daily. FOR constipation  60 tablet  11  . trolamine salicylate (ASPERCREME/ALOE) 10 % cream Apply topically as needed.  85 g  0   No current facility-administered medications on file prior to visit.    No Known Allergies  Past Medical History  Diagnosis Date  . Hypertension     No past surgical history on file.  History  Smoking status  . Never Smoker   Smokeless tobacco  . Not on file    History  Alcohol Use No    No family history on file.  Reviw of Systems:  Reviewed in the HPI.  All other systems are negative.  Physical Exam: Blood pressure 110/70, pulse 66, height 5\' 10"  (1.778 m), weight 174 lb (78.926  kg). General: Well developed, well nourished, in no acute distress.  Head: Normocephalic, atraumatic, sclera non-icteric, mucus membranes are moist,   Neck: Supple. Carotids are 2 + without bruits. No JVD   Lungs: Clear   Heart: RR, normal S1, S2  Abdomen: Soft, non-tender, non-distended with normal bowel sounds.  Msk:  Strength and tone are normal   Extremities: No clubbing or cyanosis. No edema.  Distal pedal pulses are 2+ and equal    Neuro: CN II - XII intact.  Alert and oriented X 3.   Psych:  Normal   ECG: August 17, 2012:  NSR, right superior axis.  No ST or T wave changes.  Assessment / Plan:

## 2012-08-17 NOTE — Patient Instructions (Addendum)
Your physician has recommended you make the following change in your medication:  START ATORVASTATIN 20 MG EACH EVENING TO HELP KEEP YOUR CHOLESTEROL LOW  YOU NEED TO FOLLOW UP WITH IN 3 MONTHS FOR A LAB DRAW WITH YOUR PRIMARY CARE PHYSICIAN. IT IS IMPORTANT TO MAKE SURE THE ATORVASTATIN AGREES WITH YOUR BODY/ LIVER.   Your physician recommends that you schedule a follow-up appointment in: AS NEEDED BASIS

## 2012-08-17 NOTE — Assessment & Plan Note (Addendum)
Aaron Dennis was sent to our office for somewhat unclear reasons. I cannot find any notes in the previous doctors office visits indicate that he needs to see cardiology. He was incidentally found to have coronary calcifications when he had a CT of the chest and abdomen which was performed because of some flank pain. He has no cardiac symptoms. He denies any chest pain or shortness breath. He is planning on doing some exercises in the near future and has bought an exercise machine.  He is lipids are slightly elevated. Because of his coronary calcification we will start him on atorvastatin 20 mg a day. He will followup with his primary medical doctor for followup lipids, liver enzymes, and basic metabolic profile.   Coronary artery disease and coronary artery past grafting are listed on his problem list but I think that this is an air.   I see no notes supporting these diagnoses.     I will see him on an as-needed basis.

## 2012-09-28 ENCOUNTER — Ambulatory Visit: Payer: Self-pay | Attending: Family Medicine

## 2012-10-17 ENCOUNTER — Encounter: Payer: Self-pay | Admitting: Internal Medicine

## 2012-10-17 ENCOUNTER — Ambulatory Visit: Payer: No Typology Code available for payment source | Attending: Family Medicine | Admitting: Internal Medicine

## 2012-10-17 VITALS — BP 132/83 | HR 77 | Temp 98.9°F | Resp 18 | Wt 178.0 lb

## 2012-10-17 DIAGNOSIS — Z79899 Other long term (current) drug therapy: Secondary | ICD-10-CM | POA: Insufficient documentation

## 2012-10-17 DIAGNOSIS — K219 Gastro-esophageal reflux disease without esophagitis: Secondary | ICD-10-CM | POA: Insufficient documentation

## 2012-10-17 DIAGNOSIS — M79609 Pain in unspecified limb: Secondary | ICD-10-CM | POA: Insufficient documentation

## 2012-10-17 DIAGNOSIS — I10 Essential (primary) hypertension: Secondary | ICD-10-CM | POA: Insufficient documentation

## 2012-10-17 DIAGNOSIS — Z09 Encounter for follow-up examination after completed treatment for conditions other than malignant neoplasm: Secondary | ICD-10-CM | POA: Insufficient documentation

## 2012-10-17 DIAGNOSIS — M549 Dorsalgia, unspecified: Secondary | ICD-10-CM

## 2012-10-17 MED ORDER — OXYCODONE-ACETAMINOPHEN 10-325 MG PO TABS: 1.0000 | ORAL_TABLET | Freq: Every day | ORAL | Status: DC

## 2012-10-17 MED ORDER — OMEPRAZOLE 40 MG PO CPDR
40.0000 mg | DELAYED_RELEASE_CAPSULE | Freq: Every day | ORAL | Status: DC
Start: 2012-10-17 — End: 2013-05-26

## 2012-10-17 NOTE — Progress Notes (Signed)
Patient ID: Aaron Dennis, male   DOB: 1955-04-26, 57 y.o.   MRN: [id number] PCP:  Standley Dakins, MD     Chief Complaint:  Followup  HPI: 57 year old [place] male with history of hypertension hyperlipidemia here for followup on his ongoing right-sided mid back pain. He reports being to be worsened during the night and has difficulty with his sleep. He has been on Flexeril and meloxicam without good relief. He has had imaging including CT scan and MRI of the abdomen which were unremarkable. He was referred to sports medicine clinic and was given some exercises which have helped him to some extent. He has an appointment with spine surgery later this month. He also has some pain over the right fourth with a course during the night as well. Denies any difficulty with walking or weightbearing. Denies any trauma. he denies any headache, blurred vision, chills, fever, dizziness, chest pain, palpitations, shortness of breath, muscle weakness, fatigue, abdominal pain, nausea, vomiting, bowel or urinary symptoms. He does report of ongoing  acid reflux and was on Prilosec in the past which helped with the symptoms. Denies any change in his weight or appetite.  Allergies: No Known Allergies  Prior to Admission medications   Medication Sig Start Date End Date Taking? Authorizing Provider  atorvastatin (LIPITOR) 20 MG tablet Take 1 tablet (20 mg total) by mouth daily. 08/17/12  Yes Vesta Mixer, MD  benazepril (LOTENSIN) 20 MG tablet Take 1 tablet (20 mg total) by mouth daily. 06/23/12  Yes Clanford Cyndie Mull, MD  cyclobenzaprine (FLEXERIL) 10 MG tablet Take 1 tablet (10 mg total) by mouth at bedtime. 08/15/12  Yes Calvert Cantor, MD  meloxicam (MOBIC) 15 MG tablet Take 1 tablet (15 mg total) by mouth daily. 08/15/12  Yes Calvert Cantor, MD  sennosides-docusate sodium (SENOKOT-S) 8.6-50 MG tablet Take 2 tablets by mouth daily. FOR constipation 06/08/12  Yes Edsel Petrin, DO  trolamine salicylate  (ASPERCREME/ALOE) 10 % cream Apply topically as needed. 08/15/12  Yes Calvert Cantor, MD  Naphazoline HCl (CLEAR EYES OP) Apply 1 drop to eye daily as needed. For dry eyes    Historical Provider, MD  omeprazole (PRILOSEC) 40 MG capsule Take 1 capsule (40 mg total) by mouth daily. 10/17/12   Kianni Lheureux, MD  oxyCODONE-acetaminophen (PERCOCET) 10-325 MG per tablet Take 1 tablet by mouth at bedtime. 10/17/12   Kendall Arnell, MD  polyethylene glycol powder (GLYCOLAX/MIRALAX) powder Take 17 g by mouth 2 (two) times daily as needed. 06/23/12   Clanford Cyndie Mull, MD    Past Medical History  Diagnosis Date  . Hypertension     History reviewed. No pertinent past surgical history.  Social History:  reports that he has never smoked. He does not have any smokeless tobacco history on file. He reports that he does not drink alcohol or use illicit drugs.  History reviewed. No pertinent family history.  Review of Systems:  As outlined in history of present illness   Physical Exam:  Filed Vitals:   10/17/12 1101  BP: 132/83  Pulse: 77  Temp: 98.9 F (37.2 C)  TempSrc: Oral  Resp: 18  Weight: 178 lb (80.74 kg)  SpO2: 100%    Constitutional: Vital signs reviewed. Middle aged male in no acute distress HEENT: No pallor, moist oral mucosa Chest: Clear to auscultation bilaterally, no added sounds CVS: Normal S1 and S2, no murmurs rub or gallop Abdomen: Soft, nontender, nondistended, bowel sounds present Extremities: No deformity or swelling or mass in  the back, normal range of motion, no tenderness to palpation over the back, normal range of motion over the right foot and ankle joint, no swelling, distal pulses palpable CNS: AAO x3 Labs on Admission:  No results found for this or any previous visit (from the past 48 hour(s)).  Radiological Exams on Admission: Ct Abdomen Pelvis W Wo Contrast  06/13/2012   *RADIOLOGY REPORT*  Clinical Data: Abdominal pain.  Evaluate for potential kidney stone.   CT ABDOMEN AND PELVIS WITHOUT AND WITH CONTRAST  Technique:  Multidetector CT imaging of the abdomen and pelvis was performed without contrast material in one or both body regions, followed by contrast material(s) and further sections in one or both body regions.  Contrast: OMNIPAQUE IOHEXOL 300 MG/ML  SOLN  Comparison: No priors.  Findings:  Lung Bases: Atherosclerotic calcifications in the left anterior descending coronary artery.  Abdomen/Pelvis:  There are no abnormal calcifications within the collecting system of either kidney, along the course of either ureter, or within the lumen of the urinary bladder.  No focal cystic or solid renal lesions are noted on postcontrast images in the left kidney.  In the right kidney there is a 1.2 cm nonenhancing low attenuation lesion compatible with a simple cyst in the anterior aspect of the interpolar region.  Post contrast delayed images demonstrate no filling defects within the collecting system of either kidney or along the visualized course of either ureter to suggest the presence of an upper tract urothelial neoplasm at this time.  The appearance of the liver, gallbladder, pancreas, spleen and bilateral adrenal glands is unremarkable.  No significant volume of ascites.  No pneumoperitoneum.  No pathologic distension of small bowel.  No definite pathologic lymphadenopathy identified in the abdomen or pelvis.  Normal appendix.  There are a few scattered colonic diverticula, without surrounding inflammatory changes to suggest an acute diverticulitis at this time.  Urinary bladder is unremarkable in appearance.  Musculoskeletal: There are no aggressive appearing lytic or blastic lesions noted in the visualized portions of the skeleton.  IMPRESSION: 1.  No abnormal urinary tract calculi. 2.  No acute findings in the abdomen or pelvis to account for the patient's symptoms. 3.  Specifically, the appendix is normal. 4.  Mild atherosclerosis, including left anterior  descending coronary artery disease. Please note that although the presence of coronary artery calcium documents the presence of coronary artery disease, the severity of this disease and any potential stenosis cannot be assessed on this non-gated CT examination.  Assessment for potential risk factor modification, dietary therapy or pharmacologic therapy may be warranted, if clinically indicated.   Original Report Authenticated By: Trudie Reed, M.D.    Assessment/Plan Ongoing right mid back pain Workup so far unremarkable including imaging. Did not improve on current medications. 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  He is instructed to keep the appointment with spine surgery later this month. I will check a CPK level as well.  Right foot pain No clear etiology. Physical exam unremarkable. Does not need any imaging at this time.  GERD Prescribed him with a course of Prilosec  Remaining medical issues are stable. Followup in 3 months    Javanna Patin 10/17/2012, 11:38 AM

## 2012-10-17 NOTE — Progress Notes (Signed)
Pt is here to f/u on right side/back flank pain States he has appt w/ the spine center in [place] on 9/29 Also needing meds for acid reflux Alert w/no signs of acute distress.

## 2012-10-28 ENCOUNTER — Emergency Department (HOSPITAL_COMMUNITY): Payer: No Typology Code available for payment source

## 2012-10-28 ENCOUNTER — Encounter (HOSPITAL_COMMUNITY): Payer: Self-pay | Admitting: *Deleted

## 2012-10-28 ENCOUNTER — Emergency Department (HOSPITAL_COMMUNITY)
Admission: EM | Admit: 2012-10-28 | Discharge: 2012-10-28 | Disposition: A | Discharge status: Home / Self Care | Payer: No Typology Code available for payment source | Attending: Emergency Medicine | Admitting: Emergency Medicine

## 2012-10-28 DIAGNOSIS — Y9389 Activity, other specified: Secondary | ICD-10-CM | POA: Insufficient documentation

## 2012-10-28 DIAGNOSIS — W460XXA Contact with hypodermic needle, initial encounter: Secondary | ICD-10-CM | POA: Insufficient documentation

## 2012-10-28 DIAGNOSIS — Z791 Long term (current) use of non-steroidal anti-inflammatories (NSAID): Secondary | ICD-10-CM | POA: Insufficient documentation

## 2012-10-28 DIAGNOSIS — S91109A Unspecified open wound of unspecified toe(s) without damage to nail, initial encounter: Secondary | ICD-10-CM | POA: Insufficient documentation

## 2012-10-28 DIAGNOSIS — Z79899 Other long term (current) drug therapy: Secondary | ICD-10-CM | POA: Insufficient documentation

## 2012-10-28 DIAGNOSIS — Y929 Unspecified place or not applicable: Secondary | ICD-10-CM | POA: Insufficient documentation

## 2012-10-28 DIAGNOSIS — M79674 Pain in right toe(s): Secondary | ICD-10-CM

## 2012-10-28 DIAGNOSIS — I10 Essential (primary) hypertension: Secondary | ICD-10-CM | POA: Insufficient documentation

## 2012-10-28 NOTE — ED Provider Notes (Signed)
Medical screening examination/treatment/procedure(s) were performed by non-physician practitioner and as supervising physician I was immediately available for consultation/collaboration.  Layla Maw Azavion Bouillon, DO 10/28/12 2334

## 2012-10-28 NOTE — ED Provider Notes (Signed)
CSN: [id number]     Arrival date & time 10/28/12  1417 History  This chart was scribed for non-physician practitioner, Roxy Horseman, PA-C working with Layla Maw Ward, DO by Greggory Stallion, ED scribe. This patient was seen in room TR08C/TR08C and the patient's care was started at 3:08 PM.   Chief Complaint  Patient presents with  . Toe Injury   The history is provided by the patient. No language interpreter was used.    HPI Comments: Aaron Dennis is a 57 y.o. male with h/o HTN who presents to the Emergency Department complaining of right fourth toe pain with associated mild swelling that started 3-4 months ago. Pain radiates into his midfoot. Pt states he poked it with a needle today and some blood came out. He states there was some relief with the puncture but his toe continues to bleed. Pt states when he wraps his toe, it relieves some pain. Walking on it worsens the pain. He went to a wellness clinic for it and was just given pain medication. Pt denies fever, chills, leg swelling, numbness and tingling. Denies injury to the foot or toe.   Past Medical History  Diagnosis Date  . Hypertension    History reviewed. No pertinent past surgical history. No family history on file. History  Substance Use Topics  . Smoking status: Never Smoker   . Smokeless tobacco: Not on file  . Alcohol Use: No    Review of Systems  Constitutional: Negative for fever and chills.  Cardiovascular: Negative for leg swelling.  Musculoskeletal: Positive for joint swelling and arthralgias.  Neurological: Negative for numbness.     Allergies  Review of patient's allergies indicates no known allergies.  Home Medications   Current Outpatient Rx  Name  Route  Sig  Dispense  Refill  . atorvastatin (LIPITOR) 20 MG tablet   Oral   Take 1 tablet (20 mg total) by mouth daily.   90 tablet   0   . benazepril (LOTENSIN) 20 MG tablet   Oral   Take 1 tablet (20 mg total) by mouth daily.   30 tablet   4    . cyclobenzaprine (FLEXERIL) 10 MG tablet   Oral   Take 1 tablet (10 mg total) by mouth at bedtime.   30 tablet   3   . meloxicam (MOBIC) 15 MG tablet   Oral   Take 1 tablet (15 mg total) by mouth daily.   30 tablet   3   . Naphazoline HCl (CLEAR EYES OP)   Ophthalmic   Apply 1 drop to eye daily as needed. For dry eyes         . omeprazole (PRILOSEC) 40 MG capsule   Oral   Take 1 capsule (40 mg total) by mouth daily.   30 capsule   2   . oxyCODONE-acetaminophen (PERCOCET) 10-325 MG per tablet   Oral   Take 1 tablet by mouth at bedtime.   15 tablet   0   . polyethylene glycol powder (GLYCOLAX/MIRALAX) powder   Oral   Take 17 g by mouth 2 (two) times daily as needed.   3350 g   3   . sennosides-docusate sodium (SENOKOT-S) 8.6-50 MG tablet   Oral   Take 2 tablets by mouth daily. FOR constipation   60 tablet   11   . trolamine salicylate (ASPERCREME/ALOE) 10 % cream   Topical   Apply topically as needed.   85 g   0  BP 140/83  Pulse 96  Temp(Src) 97.7 F (36.5 C) (Oral)  Resp 16  SpO2 98%  Physical Exam  Nursing note and vitals reviewed. Constitutional: He appears well-developed and well-nourished. No distress.  HENT:  Head: Normocephalic and atraumatic.  Neck: Neck supple.  Pulmonary/Chest: Effort normal.  Musculoskeletal: He exhibits no edema and no tenderness.  Right fourth toe with dried blood. No erythema. No edema. No warmth. Non tender. Nail is intact. No purulent discharge. Full active ROM in toe. Capillary refill < 2 seconds.   Neurological: He is alert.  Skin: He is not diaphoretic.    ED Course  Procedures (including critical care time)  DIAGNOSTIC STUDIES: Oxygen Saturation is 98% on RA, normal by my interpretation.    COORDINATION OF CARE: 3:11 PM-Discussed treatment plan which includes xray with pt at bedside and pt agreed to plan.   3:56 PM-Advised pt that his xray was normal and to follow up with a foot specialist.   Labs  Review Labs Reviewed - No data to display Imaging Review Dg Toe 4th Right  10/28/2012   CLINICAL DATA:  Painful and bleeding right 4th toe.  EXAM: RIGHT FOURTH TOE  COMPARISON:  Plain films of the right foot 10/07/2010.  FINDINGS: There is no evidence of fracture or dislocation. There is no evidence of arthropathy or other focal bone abnormality. Soft tissues are unremarkable.  IMPRESSION: Negative.   Electronically Signed   By: Drusilla Kanner M.D.   On: 10/28/2012 15:36    MDM   1. Toe pain, right      Patient with several months of toe pain without injury.  Neurovascularly intact.  No signs of infection.  Xray negative.  D/C home with no additional medication as he is on Percocet 10s recently prescribed by PCP [facility]).  Referred to podiatry. Pt is not diabetic, not immunocompromised.  Not at high risk for infection after he purposely punctured his toe.  Discussed all results with patient.  Pt given return precautions.  Pt verbalizes understanding and agrees with plan.     I doubt any other EMC precluding discharge at this time including, but not necessarily limited to the following: gout, cellulitis, abscess, occult fracture    I personally performed the services described in this documentation, which was scribed in my presence. The recorded information has been reviewed and is accurate.   [address], PA-C 10/28/12 [phone]

## 2012-10-28 NOTE — ED Notes (Signed)
Pt states 4th R toe began hurting yesterday.  He poked it with a needle and blood came out.  States some relief with puncture, but toe continues to bleed.

## 2012-12-16 ENCOUNTER — Ambulatory Visit: Payer: No Typology Code available for payment source

## 2012-12-20 ENCOUNTER — Ambulatory Visit: Payer: No Typology Code available for payment source | Attending: Internal Medicine | Admitting: Internal Medicine

## 2012-12-20 ENCOUNTER — Encounter: Payer: Self-pay | Admitting: Internal Medicine

## 2012-12-20 VITALS — BP 133/92 | HR 71 | Temp 98.2°F | Resp 16 | Ht 66.0 in | Wt 179.0 lb

## 2012-12-20 DIAGNOSIS — R109 Unspecified abdominal pain: Secondary | ICD-10-CM

## 2012-12-20 DIAGNOSIS — M549 Dorsalgia, unspecified: Secondary | ICD-10-CM

## 2012-12-20 DIAGNOSIS — M79609 Pain in unspecified limb: Secondary | ICD-10-CM

## 2012-12-20 DIAGNOSIS — B353 Tinea pedis: Secondary | ICD-10-CM | POA: Insufficient documentation

## 2012-12-20 DIAGNOSIS — I10 Essential (primary) hypertension: Secondary | ICD-10-CM | POA: Insufficient documentation

## 2012-12-20 DIAGNOSIS — M79671 Pain in right foot: Secondary | ICD-10-CM

## 2012-12-20 LAB — POCT URINALYSIS DIPSTICK
Blood, UA: NEGATIVE
Glucose, UA: NEGATIVE
Spec Grav, UA: 1.025
Urobilinogen, UA: 0.2

## 2012-12-20 MED ORDER — CYCLOBENZAPRINE HCL 10 MG PO TABS: 10.0000 mg | ORAL_TABLET | Freq: Three times a day (TID) | ORAL | Status: DC | PRN

## 2012-12-20 MED ORDER — CENTRUM SILVER ADULT 50+ PO TABS: 1.0000 | ORAL_TABLET | Freq: Every day | ORAL | Status: DC

## 2012-12-20 MED ORDER — BENAZEPRIL HCL 20 MG PO TABS: 20.0000 mg | ORAL_TABLET | Freq: Every day | ORAL | Status: DC

## 2012-12-20 MED ORDER — TRIAMCINOLONE ACETONIDE 0.1 % EX CREA: 1.0000 "application " | TOPICAL_CREAM | Freq: Two times a day (BID) | CUTANEOUS | Status: DC

## 2012-12-20 NOTE — Progress Notes (Signed)
Patient ID: BRENTT FREAD, male   DOB: 03/12/1955, 57 y.o.   MRN: [id number] Patient Demographics  Imari Sivertsen, is a 57 y.o. male  [id number]  [id number]  DOB - 10/08/1955  Chief Complaint  Patient presents with  . Follow-up  . Flank Pain        Subjective:   Fayette Hamada is a 57 y.o. male here today for a follow up visit. He complains of right foot pain that has been on and off for a few months, mostly between the bones on the dorsum part of the foot. He did not remember an injury. He also complained of left flank pain that has been on and off cough and has been extensively worked up in the past including MRI of the kidneys, only small cyst revealed and report of MRI showed no evidence of organic cause of pain from MRI standpoint. 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 No fever. He does not smoke cigarette, does not drink alcohol. Patient has No headache, No chest pain, No abdominal pain - No Nausea, No new weakness tingling or numbness, No Cough - SOB.  ALLERGIES: No Known Allergies  PAST MEDICAL HISTORY: Past Medical History  Diagnosis Date  . Hypertension     MEDICATIONS AT HOME: Prior to Admission medications   Medication Sig Start Date End Date Taking? Authorizing Provider  benazepril (LOTENSIN) 20 MG tablet Take 1 tablet (20 mg total) by mouth daily. 12/20/12  Yes Jeanann Lewandowsky, MD  cyclobenzaprine (FLEXERIL) 10 MG tablet Take 1 tablet (10 mg total) by mouth 3 (three) times daily as needed for muscle spasms. 12/20/12   Jeanann Lewandowsky, MD  omeprazole (PRILOSEC) 40 MG capsule Take 1 capsule (40 mg total) by mouth daily. 10/17/12   Nishant Dhungel, MD  oxyCODONE-acetaminophen (PERCOCET) 10-325 MG per tablet Take 1 tablet by mouth at bedtime. 10/17/12   Nishant Dhungel, MD  triamcinolone cream (KENALOG) 0.1 % Apply 1 application topically 2 (two) times daily. 12/20/12   Jeanann Lewandowsky, MD  trolamine salicylate (ASPERCREME/ALOE) 10 % cream Apply topically as  needed. 08/15/12   Calvert Cantor, MD     Objective:   Filed Vitals:   12/20/12 1157  BP: 133/92  Pulse: 71  Temp: 98.2 F (36.8 C)  TempSrc: Oral  Resp: 16  Height: 5\' 6"  (1.676 m)  Weight: 179 lb (81.194 kg)  SpO2: 98%    Exam General appearance : Awake, alert, not in any distress. Speech Clear. Not toxic looking HEENT: Atraumatic and Normocephalic, pupils equally reactive to light and accomodation Neck: supple, no JVD. No cervical lymphadenopathy.  Chest:Good air entry bilaterally, no added sounds  CVS: S1 S2 regular, no murmurs.  Abdomen: Bowel sounds present, Non tender and not distended with no gaurding, rigidity or rebound. Negative for renal angle tenderness Extremities: Tinea pedis on the left leg, mild point tenderness on the dorsum of her right foot. Gait is normal. B/L Lower Ext shows no edema, both legs are warm to touch Neurology: Awake alert, and oriented X 3, CN II-XII intact, Non focal Skin:No Rash Wounds:N/A   Data Review   CBC No results found for this basename: WBC, HGB, HCT, PLT, MCV, MCH, MCHC, RDW, NEUTRABS, LYMPHSABS, MONOABS, EOSABS, BASOSABS, BANDABS, BANDSABD,  in the last 168 hours  Chemistries   No results found for this basename: NA, K, CL, CO2, GLUCOSE, BUN, CREATININE, GFRCGP, CALCIUM, MG, AST, ALT, ALKPHOS, BILITOT,  in the last 168 hours ------------------------------------------------------------------------------------------------------------------ No results found for this  basename: HGBA1C,  in the last 72 hours ------------------------------------------------------------------------------------------------------------------ No results found for this basename: CHOL, HDL, LDLCALC, TRIG, CHOLHDL, LDLDIRECT,  in the last 72 hours ------------------------------------------------------------------------------------------------------------------ No results found for this basename: TSH, T4TOTAL, FREET3, T3FREE, THYROIDAB,  in the last 72  hours ------------------------------------------------------------------------------------------------------------------ No results found for this basename: VITAMINB12, FOLATE, FERRITIN, TIBC, IRON, RETICCTPCT,  in the last 72 hours  Coagulation profile  No results found for this basename: INR, PROTIME,  in the last 168 hours    Assessment & Plan   Patient Active Problem List   Diagnosis Date Noted  . Coronary artery calcification 08/17/2012  . Chronic abdominal pain 06/24/2012  . Right-sided chest wall pain 06/24/2012  . Constipation, chronic 06/24/2012  . Hyperbilirubinemia 06/24/2012  . Chronic back pain 06/24/2012  . DDD (degenerative disc disease), thoracolumbar 06/24/2012  . History of nephrolithiasis 06/24/2012  . Side pain 06/24/2012  . Back pain 06/24/2012     Plan: X-ray of right foot  Refill the following medications: Hypertension controlled Benazepril 20 mg tablet by mouth daily for hypertension  Muscle spasm: Flexeril 10 mg tablet by mouth 3 times a day when necessary  Tinea Pedis Triamcinolone cream 0.1% local application 3 times a day  Patient extensively counseled about nutrition and exercise  Follow up in 3 months or when necessary   The patient was given clear instructions to go to ER or return to medical center if symptoms don't improve, worsen or new problems develop. The patient verbalized understanding. The patient was told to call to get lab results if they haven't heard anything in the next week.    Jeanann Lewandowsky, MD, MHA, FACP, FAAP [facility] and [facility] [place], [place] [phone]   12/20/2012, 12:32 PM

## 2012-12-20 NOTE — Progress Notes (Signed)
Pt here post 2 mnth visit with c/o continued right flank pain worsening with lying down xx1 yr.  Negative MRI/CT Denies urinary sx's No fever,chills,n,v Rash noted to left leg-prescribed cream not working Right foot/ankle pain. No swelling Urine dipstick obtained

## 2012-12-20 NOTE — Patient Instructions (Signed)

## 2012-12-28 ENCOUNTER — Ambulatory Visit: Payer: No Typology Code available for payment source

## 2012-12-28 ENCOUNTER — Ambulatory Visit (HOSPITAL_COMMUNITY)
Admission: RE | Admit: 2012-12-28 | Discharge: 2012-12-28 | Disposition: A | Discharge status: Home / Self Care | Payer: No Typology Code available for payment source | Source: Ambulatory Visit | Attending: Internal Medicine | Admitting: Internal Medicine

## 2012-12-28 ENCOUNTER — Telehealth: Payer: Self-pay

## 2012-12-28 DIAGNOSIS — M79609 Pain in unspecified limb: Secondary | ICD-10-CM | POA: Insufficient documentation

## 2012-12-28 DIAGNOSIS — M79671 Pain in right foot: Secondary | ICD-10-CM

## 2012-12-28 NOTE — Telephone Encounter (Signed)
Message copied by Lestine Mount on Wed Dec 28, 2012  4:14 PM ------      Message from: Jeanann Lewandowsky E      Created: Wed Dec 28, 2012  3:56 PM       Please call patient to inform him that the right foot x-ray is normal. ------

## 2012-12-28 NOTE — Telephone Encounter (Signed)
Spoke with patient  Is aware of his xray results

## 2013-01-26 ENCOUNTER — Ambulatory Visit: Payer: No Typology Code available for payment source | Admitting: Internal Medicine

## 2013-02-07 ENCOUNTER — Ambulatory Visit: Payer: No Typology Code available for payment source | Attending: Internal Medicine | Admitting: Internal Medicine

## 2013-02-07 ENCOUNTER — Encounter: Payer: Self-pay | Admitting: Internal Medicine

## 2013-02-07 VITALS — BP 139/95 | HR 90 | Temp 97.8°F | Resp 16 | Ht 70.0 in | Wt 177.0 lb

## 2013-02-07 DIAGNOSIS — R079 Chest pain, unspecified: Secondary | ICD-10-CM

## 2013-02-07 DIAGNOSIS — I10 Essential (primary) hypertension: Secondary | ICD-10-CM

## 2013-02-07 DIAGNOSIS — J45909 Unspecified asthma, uncomplicated: Secondary | ICD-10-CM

## 2013-02-07 DIAGNOSIS — J209 Acute bronchitis, unspecified: Secondary | ICD-10-CM | POA: Insufficient documentation

## 2013-02-07 DIAGNOSIS — M549 Dorsalgia, unspecified: Secondary | ICD-10-CM

## 2013-02-07 DIAGNOSIS — R109 Unspecified abdominal pain: Secondary | ICD-10-CM

## 2013-02-07 LAB — LIPID PANEL
HDL: 25 mg/dL — ABNORMAL LOW (ref >39)
LDL Cholesterol: 115 mg/dL — ABNORMAL HIGH (ref 0–99)
Total CHOL/HDL Ratio: 6.4 Ratio
VLDL: 19 mg/dL (ref 0–40)

## 2013-02-07 LAB — COMPLETE METABOLIC PANEL WITH GFR
AST: 19 U/L (ref 0–37)
Albumin: 4.3 g/dL (ref 3.5–5.2)
Alkaline Phosphatase: 121 U/L — ABNORMAL HIGH (ref 39–117)
Calcium: 9.3 mg/dL (ref 8.4–10.5)
Chloride: 103 mEq/L (ref 96–112)
GFR, Est Non African American: >89 mL/min
Glucose, Bld: 86 mg/dL (ref 70–99)
Potassium: 4.2 mEq/L (ref 3.5–5.3)
Sodium: 140 mEq/L (ref 135–145)
Total Protein: 7 g/dL (ref 6.0–8.3)

## 2013-02-07 MED ORDER — AZITHROMYCIN 500 MG PO TABS: 500.0000 mg | ORAL_TABLET | Freq: Every day | ORAL | Status: AC

## 2013-02-07 MED ORDER — AZITHROMYCIN 500 MG PO TABS: 500.0000 mg | ORAL_TABLET | Freq: Every day | ORAL | Status: DC

## 2013-02-07 MED ORDER — CYCLOBENZAPRINE HCL 10 MG PO TABS: 10.0000 mg | ORAL_TABLET | Freq: Three times a day (TID) | ORAL | Status: DC | PRN

## 2013-02-07 MED ORDER — BENAZEPRIL HCL 20 MG PO TABS: 20.0000 mg | ORAL_TABLET | Freq: Every day | ORAL | Status: DC

## 2013-02-07 MED ORDER — FLUTICASONE PROPIONATE 50 MCG/ACT NA SUSP: 2.0000 | Freq: Every day | NASAL | Status: DC

## 2013-02-07 MED ORDER — MELOXICAM 7.5 MG PO TABS: 7.5000 mg | ORAL_TABLET | Freq: Two times a day (BID) | ORAL | Status: DC

## 2013-02-07 NOTE — Progress Notes (Signed)
Pt is here today with a persistent cough causing him SOB and chest pain.

## 2013-02-07 NOTE — Progress Notes (Signed)
Patient ID: Aaron Dennis, male   DOB: 17-Jun-1955, 57 y.o.   MRN: [id number]   CC:  HPI: 57 year old with a history of hypertension, chronic back pain presents with cough congestion sore throat or shortness of breath and chest heaviness for the last 2 weeks. His wife had similar symptoms. He denies any fever has a nonproductive cough and sinus congestion and some sinus pain  No Known Allergies Past Medical History  Diagnosis Date  . Hypertension    Current Outpatient Prescriptions on File Prior to Visit  Medication Sig Dispense Refill  . omeprazole (PRILOSEC) 40 MG capsule Take 1 capsule (40 mg total) by mouth daily.  30 capsule  2  . triamcinolone cream (KENALOG) 0.1 % Apply 1 application topically 2 (two) times daily.  30 g  1  . Multiple Vitamins-Minerals (CENTRUM SILVER ADULT 50+) TABS Take 1 tablet by mouth daily.  60 tablet  2  . oxyCODONE-acetaminophen (PERCOCET) 10-325 MG per tablet Take 1 tablet by mouth at bedtime.  15 tablet  0  . trolamine salicylate (ASPERCREME/ALOE) 10 % cream Apply topically as needed.  85 g  0   No current facility-administered medications on file prior to visit.   History reviewed. No pertinent family history. History   Social History  . Marital Status: Married    Spouse Name: N/A    Number of Children: N/A  . Years of Education: N/A   Occupational History  . Not on file.   Social History Main Topics  . Smoking status: Never Smoker   . Smokeless tobacco: Not on file  . Alcohol Use: No  . Drug Use: No  . Sexual Activity: Not on file   Other Topics Concern  . Not on file   Social History Narrative  . No narrative on file    Review of Systems  Constitutional: as in history of present illness.  HENT: as in history of present illness  Eyes: Negative for pain, discharge, redness, itching and visual disturbance.  Respiratory:as in history of present illness  Cardiovascular: Negative for chest pain, palpitations and leg swelling.   Gastrointestinal: Negative for abdominal distention.  Genitourinary: Negative for dysuria, urgency, frequency, hematuria, flank pain, decreased urine volume, difficulty urinating and dyspareunia.  Musculoskeletal: Negative for back pain, joint swelling, arthralgias and gait problem.  Neurological: Negative for dizziness, tremors, seizures, syncope, facial asymmetry, speech difficulty, weakness, light-headedness, numbness and headaches.  Hematological: Negative for adenopathy. Does not bruise/bleed easily.  Psychiatric/Behavioral: Negative for hallucinations, behavioral problems, confusion, dysphoric mood, decreased concentration and agitation.    Objective:   Filed Vitals:   02/07/13 0957  BP: 139/95  Pulse: 90  Temp: 97.8 F (36.6 C)  Resp: 16    Physical Exam  Constitutional: Appears well-developed and well-nourished. No distress.  HENT: Normocephalic. External right and left ear normal. Oropharynx is clear and moist.  Eyes: Conjunctivae and EOM are normal. PERRLA, no scleral icterus.  Neck: Normal ROM. Neck supple. No JVD. No tracheal deviation. No thyromegaly.  CVS: RRR, S1/S2 +, no murmurs, no gallops, no carotid bruit.  Pulmonary: Effort and breath sounds normal, no stridor, rhonchi, wheezes, rales.  Abdominal: Soft. BS +,  no distension, tenderness, rebound or guarding.  Musculoskeletal: Normal range of motion. No edema and no tenderness.  Lymphadenopathy: No lymphadenopathy noted, cervical, inguinal. Neuro: Alert. Normal reflexes, muscle tone coordination. No cranial nerve deficit. Skin: Skin is warm and dry. No rash noted. Not diaphoretic. No erythema. No pallor.  Psychiatric: Normal mood and affect.  Behavior, judgment, thought content normal.   Lab Results  Component Value Date   WBC 7.0 03/13/2009   HGB 16.5 03/13/2009   HCT 49.8 03/13/2009   MCV 82.2 03/13/2009   PLT 200 03/13/2009   Lab Results  Component Value Date   CREATININE 1.07 06/23/2012   BUN 11 06/23/2012   NA  140 06/23/2012   K 4.3 06/23/2012   CL 106 06/23/2012   CO2 26 06/23/2012    No results found for this basename: HGBA1C   Lipid Panel     Component Value Date/Time   CHOL 166 06/23/2012 1414   TRIG 98 06/23/2012 1414   HDL 27* 06/23/2012 1414   CHOLHDL 6.1 06/23/2012 1414   VLDL 20 06/23/2012 1414   LDLCALC 119* 06/23/2012 1414       Assessment and plan:   Patient Active Problem List   Diagnosis Date Noted  . Coronary artery calcification 08/17/2012  . Chronic abdominal pain 06/24/2012  . Right-sided chest wall pain 06/24/2012  . Constipation, chronic 06/24/2012  . Hyperbilirubinemia 06/24/2012  . Chronic back pain 06/24/2012  . DDD (degenerative disc disease), thoracolumbar 06/24/2012  . History of nephrolithiasis 06/24/2012  . Side pain 06/24/2012  . Back pain 06/24/2012   Acute bronchitis Will prescribe azithromycin, Flonase   Chest pain given her cycles will obtain an EKG if normal no further workup needed will likely related to acute bronchitis  Hypertension refill for Benzapril provided  Chronic back pain refill for meloxicam provided Follow up in 3 months      The patient was given clear instructions to go to ER or return to medical center if symptoms don't improve, worsen or new problems develop. The patient verbalized understanding. The patient was told to call to get any lab results if not heard anything in the next week.

## 2013-02-13 ENCOUNTER — Telehealth: Payer: Self-pay | Admitting: *Deleted

## 2013-02-13 NOTE — Telephone Encounter (Signed)
Message copied by Oree Hislop, [place] R on Mon Feb 13, 2013 10:28 AM ------      Message from: Allyson Sabal MD, [facility]      Created: Wed Feb 08, 2013  9:41 AM       Please notify patient of the labs are normal ------

## 2013-03-30 ENCOUNTER — Ambulatory Visit: Payer: Self-pay | Admitting: Internal Medicine

## 2013-05-05 ENCOUNTER — Ambulatory Visit: Payer: No Typology Code available for payment source | Admitting: Internal Medicine

## 2013-05-16 ENCOUNTER — Other Ambulatory Visit: Payer: Self-pay | Admitting: Internal Medicine

## 2013-05-17 ENCOUNTER — Other Ambulatory Visit: Payer: Self-pay | Admitting: Internal Medicine

## 2013-05-18 ENCOUNTER — Other Ambulatory Visit: Payer: Self-pay | Admitting: Internal Medicine

## 2013-05-22 ENCOUNTER — Other Ambulatory Visit: Payer: Self-pay | Admitting: Internal Medicine

## 2013-05-25 ENCOUNTER — Ambulatory Visit: Payer: No Typology Code available for payment source | Admitting: Internal Medicine

## 2013-05-26 ENCOUNTER — Other Ambulatory Visit: Payer: Self-pay | Admitting: *Deleted

## 2013-05-26 MED ORDER — OMEPRAZOLE 40 MG PO CPDR: 40.0000 mg | DELAYED_RELEASE_CAPSULE | Freq: Every day | ORAL | Status: DC

## 2013-06-20 ENCOUNTER — Encounter: Payer: Self-pay | Admitting: Internal Medicine

## 2013-06-20 ENCOUNTER — Ambulatory Visit: Payer: No Typology Code available for payment source | Attending: Internal Medicine | Admitting: Internal Medicine

## 2013-06-20 VITALS — BP 133/90 | HR 66 | Temp 97.9°F | Resp 16 | Ht 66.0 in | Wt 178.0 lb

## 2013-06-20 DIAGNOSIS — R05 Cough: Secondary | ICD-10-CM | POA: Insufficient documentation

## 2013-06-20 DIAGNOSIS — R053 Chronic cough: Secondary | ICD-10-CM

## 2013-06-20 DIAGNOSIS — R059 Cough, unspecified: Secondary | ICD-10-CM | POA: Insufficient documentation

## 2013-06-20 DIAGNOSIS — I10 Essential (primary) hypertension: Secondary | ICD-10-CM | POA: Insufficient documentation

## 2013-06-20 NOTE — Progress Notes (Signed)
Pt is here still having the chest congestion, coughing w/ white mucus and moments of SOB

## 2013-06-20 NOTE — Progress Notes (Signed)
Patient ID: Aaron Dennis, male   DOB: 02-05-1956, 58 y.o.   MRN: [id number]   [place] Chelf, is a 58 y.o. male  [id number]:[id number]  [id number]  DOB - 09/02/1955  Chief Complaint  Patient presents with  . Follow-up        Subjective:   Aaron Dennis is a 58 y.o. male here today for a follow up visit. Patient has history of hypertension, chronic back pain presents with cough, congestion, sore throat. He recently returned from a visit to [place], he was sick there with the same symptoms and was seen by a specialist who thinks he may have a problem with his vocal cord as suggested the patient be evaluated with imaging studies when he arrives in the [place]. He has not had that imaging studies, he continues to have dry cough with a feeling of lump in the throat, denies chest pain or shortness of breath. He denies any fever, no night sweats, no body pain. No change in bowel habit, no urinary. Patient has No headache, No chest pain, No abdominal pain - No Nausea, No new weakness tingling or numbness, No Cough - SOB.  No problems updated.  ALLERGIES: No Known Allergies  PAST MEDICAL HISTORY: Past Medical History  Diagnosis Date  . Hypertension     MEDICATIONS AT HOME: Prior to Admission medications   Medication Sig Start Date End Date Taking? Authorizing Provider  benazepril (LOTENSIN) 20 MG tablet Take 1 tablet (20 mg total) by mouth daily. 02/07/13  Yes Reyne Dumas, MD  cyclobenzaprine (FLEXERIL) 10 MG tablet Take 1 tablet (10 mg total) by mouth 3 (three) times daily as needed for muscle spasms. 02/07/13  Yes Reyne Dumas, MD  fluticasone (FLONASE) 50 MCG/ACT nasal spray Place 2 sprays into both nostrils daily. 02/07/13  Yes Reyne Dumas, MD  meloxicam (MOBIC) 7.5 MG tablet Take 1 tablet (7.5 mg total) by mouth 2 (two) times daily. 02/07/13  Yes Reyne Dumas, MD  Multiple Vitamins-Minerals (CENTRUM SILVER ADULT 50+) TABS Take 1 tablet by mouth daily. 12/20/12  Yes Angelica Chessman, MD    omeprazole (PRILOSEC) 40 MG capsule Take 1 capsule (40 mg total) by mouth daily. 05/26/13  Yes Angelica Chessman, MD  oxyCODONE-acetaminophen (PERCOCET) 10-325 MG per tablet Take 1 tablet by mouth at bedtime. 10/17/12  Yes Nishant Dhungel, MD  triamcinolone cream (KENALOG) 0.1 % Apply 1 application topically 2 (two) times daily. 12/20/12  Yes Angelica Chessman, MD  trolamine salicylate (ASPERCREME/ALOE) 10 % cream Apply topically as needed. 08/15/12  Yes Debbe Odea, MD     Objective:   Filed Vitals:   06/20/13 0947  BP: 133/90  Pulse: 66  Temp: 97.9 F (36.6 C)  TempSrc: Oral  Resp: 16  Height: 5\' 6"  (1.676 m)  Weight: 178 lb (80.74 kg)  SpO2: 98%    Exam General appearance : Awake, alert, not in any distress. Speech Clear. Not toxic looking HEENT: Atraumatic and Normocephalic, pupils equally reactive to light and accomodation Neck: supple, no JVD. No cervical lymphadenopathy.  Chest:Good air entry bilaterally, no added sounds  CVS: S1 S2 regular, no murmurs.  Abdomen: Bowel sounds present, Non tender and not distended with no gaurding, rigidity or rebound. Extremities: B/L Lower Ext shows no edema, both legs are warm to touch Neurology: Awake alert, and oriented X 3, CN II-XII intact, Non focal Skin:No Rash Wounds:N/A  Data Review No results found for this basename: HGBA1C     Assessment & Plan   1. Chronic cough  -  CT Soft Tissue Neck W Contrast; Future - CT Chest W Contrast; Future   We will review the results of imaging studies and decide management plan accordingly, patient agrees with plan and wants to hold off on any medications until the real cause of her chronic cough is found. Patient was extensively counseled about nutrition and exercise  Return in about 3 months (around 09/20/2013), or if symptoms worsen or fail to improve, for Abdominal Pain, Follow up HTN.  The patient was given clear instructions to go to ER or return to medical center if symptoms don't  improve, worsen or new problems develop. The patient verbalized understanding. The patient was told to call to get lab results if they haven't heard anything in the next week.   This note has been created with Surveyor, quantity. Any transcriptional errors are unintentional.    Angelica Chessman, MD, [place], [place], [place] and [facility] [place], [place]   06/20/2013, 10:42 AM

## 2013-06-22 ENCOUNTER — Encounter (HOSPITAL_COMMUNITY): Payer: Self-pay

## 2013-06-22 ENCOUNTER — Ambulatory Visit (HOSPITAL_COMMUNITY)
Admission: RE | Admit: 2013-06-22 | Discharge: 2013-06-22 | Disposition: A | Discharge status: Home / Self Care | Payer: No Typology Code available for payment source | Source: Ambulatory Visit | Attending: Internal Medicine | Admitting: Internal Medicine

## 2013-06-22 DIAGNOSIS — R059 Cough, unspecified: Secondary | ICD-10-CM | POA: Insufficient documentation

## 2013-06-22 DIAGNOSIS — R05 Cough: Secondary | ICD-10-CM | POA: Insufficient documentation

## 2013-06-22 DIAGNOSIS — R911 Solitary pulmonary nodule: Secondary | ICD-10-CM | POA: Insufficient documentation

## 2013-06-22 DIAGNOSIS — R053 Chronic cough: Secondary | ICD-10-CM

## 2013-06-22 MED ORDER — IOHEXOL 300 MG/ML  SOLN
80.0000 mL | Freq: Once | INTRAMUSCULAR | Status: AC | PRN
  Administered 2013-06-22: 80 mL via INTRAVENOUS

## 2013-06-28 ENCOUNTER — Telehealth: Payer: Self-pay | Admitting: Emergency Medicine

## 2013-06-28 MED ORDER — GUAIFENESIN 100 MG/5ML PO SOLN: ORAL | Status: DC

## 2013-06-28 MED ORDER — DOXYCYCLINE HYCLATE 100 MG PO TABS: 100.0000 mg | ORAL_TABLET | Freq: Two times a day (BID) | ORAL | Status: DC

## 2013-06-28 NOTE — Telephone Encounter (Signed)
Message copied by Ricci Barker on Wed Jun 28, 2013  2:26 PM ------      Message from: Angelica Chessman E      Created: Wed Jun 28, 2013  9:46 AM       I have informed the patient about the results of his chest and neck CT scans. I explained to him that there is 1 mm mucosal nodularity of the right true cord, and will refer him to ENT for direct visualization. I also explained to him that it was noted again that he has diffusely sclerotic bone mineral density as reported on panel MRI and may be nonspecific or could reflect a chronic metabolic disease. He told me he has been informed before and was told to watch and wait.            His cough continues, now productive of yellowish sputum. I informed him that we will treat with antibiotics.            Please call in prescription for doxycycline 100 mg tablet by mouth twice a day for 14 days      Please call in prescription Robitussin 10 mls by mouth 3 times a day for 10 days ------

## 2013-06-28 NOTE — Telephone Encounter (Signed)
Spoke with pt in regards to prescribed medication Doxycycline 100 mg tab to take bid x 14 dys and Robitussin 80mls as needed. Pt will pick up tomorrow

## 2013-07-05 ENCOUNTER — Other Ambulatory Visit: Payer: Self-pay | Admitting: Emergency Medicine

## 2013-07-05 ENCOUNTER — Telehealth: Payer: Self-pay | Admitting: Emergency Medicine

## 2013-07-05 DIAGNOSIS — J382 Nodules of vocal cords: Secondary | ICD-10-CM

## 2013-07-05 NOTE — Telephone Encounter (Signed)
Pt called in to check on ENT referral status Order placed. Pt informed, he will receive a call within 5-7 days

## 2013-09-21 ENCOUNTER — Ambulatory Visit: Payer: Self-pay | Admitting: Internal Medicine

## 2014-05-15 ENCOUNTER — Telehealth: Payer: Self-pay | Admitting: Internal Medicine

## 2014-05-15 NOTE — Telephone Encounter (Signed)
Patient has come in today to request a medication refill on all Blood pressure medications and omeprazole (PRILOSEC) 40 MG capsule; please f/u with patient about this request

## 2014-05-16 ENCOUNTER — Telehealth: Payer: Self-pay | Admitting: *Deleted

## 2014-05-16 ENCOUNTER — Encounter: Payer: Self-pay | Admitting: *Deleted

## 2014-05-16 DIAGNOSIS — I10 Essential (primary) hypertension: Secondary | ICD-10-CM

## 2014-05-16 NOTE — Telephone Encounter (Signed)
Error

## 2014-05-17 ENCOUNTER — Ambulatory Visit: Payer: Self-pay | Attending: Internal Medicine | Admitting: Internal Medicine

## 2014-05-17 ENCOUNTER — Encounter: Payer: Self-pay | Admitting: Internal Medicine

## 2014-05-17 DIAGNOSIS — M5440 Lumbago with sciatica, unspecified side: Secondary | ICD-10-CM

## 2014-05-17 DIAGNOSIS — K219 Gastro-esophageal reflux disease without esophagitis: Secondary | ICD-10-CM | POA: Insufficient documentation

## 2014-05-17 DIAGNOSIS — I10 Essential (primary) hypertension: Secondary | ICD-10-CM | POA: Insufficient documentation

## 2014-05-17 MED ORDER — OMEPRAZOLE 40 MG PO CPDR: 40.0000 mg | DELAYED_RELEASE_CAPSULE | Freq: Every day | ORAL | Status: DC

## 2014-05-17 MED ORDER — CYCLOBENZAPRINE HCL 10 MG PO TABS: 10.0000 mg | ORAL_TABLET | Freq: Three times a day (TID) | ORAL | Status: DC | PRN

## 2014-05-17 MED ORDER — AMLODIPINE BESYLATE 5 MG PO TABS: 5.0000 mg | ORAL_TABLET | Freq: Every day | ORAL | Status: DC

## 2014-05-17 NOTE — Progress Notes (Signed)
Patient is getting ready to go to [place] for 3 months and needs refills on amlodipine and omeprazole, flexeril He states no other problems

## 2014-05-17 NOTE — Patient Instructions (Signed)

## 2014-05-29 NOTE — Telephone Encounter (Signed)
error 

## 2014-11-06 NOTE — Progress Notes (Signed)
Patient ID: Aaron Dennis, male   DOB: 1955/06/08, 59 y.o.   MRN: [id number]   Aaron Dennis, is a 59 y.o. male  [id number]  [id number]  DOB - 11-23-1955  Chief Complaint  Patient presents with  . Medication Refill        Subjective:   Aaron Dennis is a 59 y.o. male here today for a follow up visit. Patient has history of hypertension and chronic back pain, he is ready to go to [place] for 3 months and he needs medication refill for the period he'll be away. He has no complaint today. Patient has No headache, No chest pain, No abdominal pain - No Nausea, No new weakness tingling or numbness, No Cough - SOB.  No problems updated.  ALLERGIES: No Known Allergies  PAST MEDICAL HISTORY: Past Medical History  Diagnosis Date  . Hypertension     MEDICATIONS AT HOME: Prior to Admission medications   Medication Sig Start Date End Date Taking? Authorizing Provider  cyclobenzaprine (FLEXERIL) 10 MG tablet Take 1 tablet (10 mg total) by mouth 3 (three) times daily as needed for muscle spasms. 05/17/14  Yes Tresa Garter, MD  omeprazole (PRILOSEC) 40 MG capsule Take 1 capsule (40 mg total) by mouth daily. 05/17/14  Yes Tresa Garter, MD  amLODipine (NORVASC) 5 MG tablet Take 1 tablet (5 mg total) by mouth daily. 05/17/14   Tresa Garter, MD  benazepril (LOTENSIN) 20 MG tablet Take 1 tablet (20 mg total) by mouth daily. Patient not taking: Reported on 05/17/2014 02/07/13   Reyne Dumas, MD  doxycycline (VIBRA-TABS) 100 MG tablet Take 1 tablet (100 mg total) by mouth 2 (two) times daily. Patient not taking: Reported on 05/17/2014 06/28/13   Tresa Garter, MD  fluticasone (FLONASE) 50 MCG/ACT nasal spray Place 2 sprays into both nostrils daily. Patient not taking: Reported on 05/17/2014 02/07/13   Reyne Dumas, MD  guaiFENesin (ROBITUSSIN) 100 MG/5ML SOLN Take 10 ml every 3-4 hrs for 10 days as needed for cough Patient not taking: Reported on 05/17/2014 06/28/13   Tresa Garter,  MD  meloxicam (MOBIC) 7.5 MG tablet Take 1 tablet (7.5 mg total) by mouth 2 (two) times daily. Patient not taking: Reported on 05/17/2014 02/07/13   Reyne Dumas, MD  Multiple Vitamins-Minerals (CENTRUM SILVER ADULT 50+) TABS Take 1 tablet by mouth daily. Patient not taking: Reported on 05/17/2014 12/20/12   Tresa Garter, MD  oxyCODONE-acetaminophen (PERCOCET) 10-325 MG per tablet Take 1 tablet by mouth at bedtime. Patient not taking: Reported on 05/17/2014 10/17/12   Nishant Dhungel, MD  triamcinolone cream (KENALOG) 0.1 % Apply 1 application topically 2 (two) times daily. Patient not taking: Reported on 05/17/2014 12/20/12   Tresa Garter, MD  trolamine salicylate (ASPERCREME/ALOE) 10 % cream Apply topically as needed. Patient not taking: Reported on 05/17/2014 08/15/12   Debbe Odea, MD     Objective:   Filed Vitals:   05/17/14 1451  BP: 137/83  Pulse: 92  Temp: 98 F (36.7 C)  Resp: 16  Height: 5\' 6"  (1.676 m)  Weight: 188 lb (85.276 kg)  SpO2: 97%    Exam General appearance : Awake, alert, not in any distress. Speech Clear. Not toxic looking HEENT: Atraumatic and Normocephalic, pupils equally reactive to light and accomodation Neck: supple, no JVD. No cervical lymphadenopathy.  Chest:Good air entry bilaterally, no added sounds  CVS: S1 S2 regular, no murmurs.  Abdomen: Bowel sounds present, Non tender and not distended with no  gaurding, rigidity or rebound. Extremities: B/L Lower Ext shows no edema, both legs are warm to touch Neurology: Awake alert, and oriented X 3, CN II-XII intact, Non focal Skin:No Rash  Data Review No results found for: HGBA1C   Assessment & Plan   1. Midline low back pain with sciatica, sciatica laterality unspecified  - cyclobenzaprine (FLEXERIL) 10 MG tablet; Take 1 tablet (10 mg total) by mouth 3 (three) times daily as needed for muscle spasms.  Dispense: 90 tablet; Refill: 3  2. Essential hypertension  - amLODipine (NORVASC) 5 MG  tablet; Take 1 tablet (5 mg total) by mouth daily.  Dispense: 90 tablet; Refill: 3  We have discussed target BP range and blood pressure goal. I have advised patient to check BP regularly and to call us back or report to clinic if the numbers are consistently higher than 140/90. We discussed the importance of compliance with medical therapy and DASH diet recommended, consequences of uncontrolled hypertension discussed.  - continue current BP medications  3. Gastroesophageal reflux disease without esophagitis  - omeprazole (PRILOSEC) 40 MG capsule; Take 1 capsule (40 mg total) by mouth daily.  Dispense: 90 capsule; Refill: 3  Patient have been counseled extensively about nutrition and exercise  Return in about 6 months (around 11/16/2014) for Follow up HTN, Follow up Pain and comorbidities.  The patient was given clear instructions to go to ER or return to medical center if symptoms don't improve, worsen or new problems develop. The patient verbalized understanding. The patient was told to call to get lab results if they haven't heard anything in the next week.   This note has been created with Surveyor, quantity. Any transcriptional errors are unintentional.    Angelica Chessman, MD, [place], [place], [place], [place] and [facility] [place], [place]

## 2015-01-08 ENCOUNTER — Ambulatory Visit: Payer: Self-pay | Attending: Internal Medicine

## 2015-08-08 ENCOUNTER — Other Ambulatory Visit: Payer: Self-pay | Admitting: Internal Medicine

## 2015-08-16 MED FILL — AMLODIPINE BESYLATE 5 MG TA: 5 | 30 days supply | Qty: 30 | Fill #0

## 2015-08-21 ENCOUNTER — Ambulatory Visit: Payer: Self-pay | Attending: Internal Medicine

## 2015-09-09 ENCOUNTER — Telehealth: Payer: Self-pay | Admitting: Internal Medicine

## 2015-09-09 NOTE — Telephone Encounter (Signed)
Pt. Called requesting to speak with his nurse. Pt. States he has questions. Please f/u with pt.

## 2015-09-09 NOTE — Telephone Encounter (Signed)
Patient verified DOB Patient needs an appointment for Wife. Patient was told to call and patient did 4 weeks ago. MA spoke with Clinic Manager and advised the patient to come in on 09/12/15 at 9:45am. Patients husband expressed his gratification and had no further questions at this time.

## 2015-10-09 ENCOUNTER — Other Ambulatory Visit: Payer: Self-pay | Admitting: Internal Medicine

## 2015-10-16 MED FILL — ?AMLODIPINE BESYLATE 5 MG T: 5 | 30 days supply | Qty: 30 | Fill #0

## 2015-11-14 ENCOUNTER — Encounter: Payer: Self-pay | Admitting: Podiatry

## 2015-11-14 ENCOUNTER — Ambulatory Visit (INDEPENDENT_AMBULATORY_CARE_PROVIDER_SITE_OTHER): Payer: No Typology Code available for payment source | Admitting: Podiatry

## 2015-11-14 ENCOUNTER — Ambulatory Visit: Payer: Self-pay

## 2015-11-14 VITALS — BP 110/70 | HR 78 | Resp 16 | Ht 66.0 in | Wt 185.0 lb

## 2015-11-14 DIAGNOSIS — M779 Enthesopathy, unspecified: Secondary | ICD-10-CM

## 2015-11-14 DIAGNOSIS — M79672 Pain in left foot: Secondary | ICD-10-CM

## 2015-11-14 DIAGNOSIS — M79671 Pain in right foot: Secondary | ICD-10-CM

## 2015-11-14 NOTE — Progress Notes (Signed)
   Subjective:    Patient ID: Aaron Dennis, male    DOB: January 18, 1956, 60 y.o.   MRN: [id number]  HPI  Chief Complaint  Patient presents with  . Foot Pain    bilateral foot and ankle pain ... pt points to anterior and medial side x yrs with the R being the worst.         Review of Systems  All other systems reviewed and are negative.      Objective:   Physical Exam        Assessment & Plan:

## 2015-11-17 NOTE — Progress Notes (Signed)
Subjective:     Patient ID: Aaron Dennis, male   DOB: 1955-06-20, 60 y.o.   MRN: [id number]  HPI patient states she's getting pain in both ankles and he wanted it checked   Review of Systems     Objective:   Physical Exam Neurovascular status unchanged with thick painful ankles bilateral with pain mostly in the sinus tarsi bilateral    Assessment:     Probable sinus tarsitis bilateral    Plan:     Injected the sinus tarsi bilateral 3 mg Kenalog 5 mg Xylocaine advised on reduced activity and reappoint as needed

## 2015-12-05 ENCOUNTER — Ambulatory Visit: Payer: Self-pay | Attending: Internal Medicine | Admitting: Internal Medicine

## 2015-12-05 DIAGNOSIS — M25562 Pain in left knee: Secondary | ICD-10-CM

## 2015-12-05 DIAGNOSIS — M25561 Pain in right knee: Secondary | ICD-10-CM

## 2015-12-05 DIAGNOSIS — I10 Essential (primary) hypertension: Secondary | ICD-10-CM

## 2015-12-05 DIAGNOSIS — R911 Solitary pulmonary nodule: Secondary | ICD-10-CM

## 2015-12-05 DIAGNOSIS — M545 Low back pain, unspecified: Secondary | ICD-10-CM

## 2015-12-05 DIAGNOSIS — M5127 Other intervertebral disc displacement, lumbosacral region: Secondary | ICD-10-CM | POA: Insufficient documentation

## 2015-12-05 DIAGNOSIS — IMO0001 Reserved for inherently not codable concepts without codable children: Secondary | ICD-10-CM

## 2015-12-05 DIAGNOSIS — K219 Gastro-esophageal reflux disease without esophagitis: Secondary | ICD-10-CM

## 2015-12-05 DIAGNOSIS — Z79899 Other long term (current) drug therapy: Secondary | ICD-10-CM | POA: Insufficient documentation

## 2015-12-05 DIAGNOSIS — G8929 Other chronic pain: Secondary | ICD-10-CM

## 2015-12-05 LAB — CBC WITH DIFFERENTIAL/PLATELET
BASOS PCT: 0 %
Basophils Absolute: 0 cells/uL (ref 0–200)
EOS PCT: 2 %
Eosinophils Absolute: 142 cells/uL (ref 15–500)
HCT: 56.2 % — ABNORMAL HIGH (ref 38.5–50.0)
HEMOGLOBIN: 18.9 g/dL — AB (ref 13.2–17.1)
Lymphocytes Relative: 32 %
Lymphs Abs: 2272 cells/uL (ref 850–3900)
MCH: 28.5 pg (ref 27.0–33.0)
MCHC: 33.8 g/dL (ref 32.0–36.0)
MCV: 84.8 fL (ref 80.0–100.0)
MONOS PCT: 6 %
MPV: 10.4 fL (ref 7.5–12.5)
Monocytes Absolute: 426 cells/uL (ref 200–950)
Neutro Abs: 4260 cells/uL (ref 1500–7800)
Neutrophils Relative %: 60 %
PLATELETS: 215 10*3/uL (ref 140–400)
RBC: 6.63 MIL/uL — AB (ref 4.20–5.80)
RDW: 14.4 % (ref 11.0–15.0)
WBC: 7.1 10*3/uL (ref 3.8–10.8)

## 2015-12-05 LAB — LIPID PANEL
Cholesterol: 180 mg/dL (ref 125–200)
HDL: 27 mg/dL — AB (ref >=40)
LDL CALC: 130 mg/dL — AB (ref <130)
Total CHOL/HDL Ratio: 6.7 Ratio — ABNORMAL HIGH (ref <=5.0)
Triglycerides: 113 mg/dL (ref <150)
VLDL: 23 mg/dL (ref <30)

## 2015-12-05 LAB — COMPLETE METABOLIC PANEL WITH GFR
ALT: 14 U/L (ref 9–46)
AST: 17 U/L (ref 10–35)
Albumin: 4.2 g/dL (ref 3.6–5.1)
Alkaline Phosphatase: 135 U/L — ABNORMAL HIGH (ref 40–115)
BILIRUBIN TOTAL: 2 mg/dL — AB (ref 0.2–1.2)
BUN: 13 mg/dL (ref 7–25)
CO2: 27 mmol/L (ref 20–31)
Calcium: 9.4 mg/dL (ref 8.6–10.3)
Chloride: 103 mmol/L (ref 98–110)
Creat: 1.02 mg/dL (ref 0.70–1.25)
GFR, EST NON AFRICAN AMERICAN: 80 mL/min (ref >=60)
GFR, Est African American: >89 mL/min (ref >=60)
GLUCOSE: 118 mg/dL — AB (ref 65–99)
Potassium: 3.7 mmol/L (ref 3.5–5.3)
SODIUM: 140 mmol/L (ref 135–146)
TOTAL PROTEIN: 7.3 g/dL (ref 6.1–8.1)

## 2015-12-05 LAB — TSH: TSH: 1.02 m[IU]/L (ref 0.40–4.50)

## 2015-12-05 MED ORDER — OMEPRAZOLE 40 MG PO CPDR: 40.0000 mg | DELAYED_RELEASE_CAPSULE | Freq: Every day | ORAL | 3 refills | Status: DC

## 2015-12-05 MED ORDER — MELOXICAM 7.5 MG PO TABS: 7.5000 mg | ORAL_TABLET | Freq: Every day | ORAL | 2 refills | Status: DC

## 2015-12-05 MED ORDER — ACETAMINOPHEN-CODEINE #3 300-30 MG PO TABS: 1.0000 | ORAL_TABLET | ORAL | 0 refills | Status: DC | PRN

## 2015-12-05 MED ORDER — AMLODIPINE BESYLATE 5 MG PO TABS: 5.0000 mg | ORAL_TABLET | Freq: Every day | ORAL | 3 refills | Status: DC

## 2015-12-05 MED ORDER — CYCLOBENZAPRINE HCL 5 MG PO TABS: 5.0000 mg | ORAL_TABLET | Freq: Three times a day (TID) | ORAL | 3 refills | Status: DC | PRN

## 2015-12-05 MED FILL — AMLODIPINE BESYLATE 5 MG TA: 5 | 30 days supply | Qty: 30 | Fill #0

## 2015-12-05 MED FILL — OMEPRAZOLE DR 40 MG CAPSULE: 40 | 30 days supply | Qty: 30 | Fill #0

## 2015-12-05 MED FILL — ?MELOXICAM 7.5 MG TABLET: 7.5 | 30 days supply | Qty: 30 | Fill #0

## 2015-12-05 NOTE — Patient Instructions (Signed)
Back Pain, Adult °Back pain is very common in adults. The cause of back pain is rarely dangerous and the pain often gets better over time. The cause of your back pain may not be known. Some common causes of back pain include: °· Strain of the muscles or ligaments supporting the spine. °· Wear and tear (degeneration) of the spinal disks. °· Arthritis. °· Direct injury to the back. °For many people, back pain may return. Since back pain is rarely dangerous, most people can learn to manage this condition on their own. °HOME CARE INSTRUCTIONS °Watch your back pain for any changes. The following actions may help to lessen any discomfort you are feeling: °· Remain active. It is stressful on your back to sit or stand in one place for long periods of time. Do not sit, drive, or stand in one place for more than 30 minutes at a time. Take short walks on even surfaces as soon as you are able. Try to increase the length of time you walk each day. °· Exercise regularly as directed by your health care provider. Exercise helps your back heal faster. It also helps avoid future injury by keeping your muscles strong and flexible. °· Do not stay in bed. Resting more than 1-2 days can delay your recovery. °· Pay attention to your body when you bend and lift. The most comfortable positions are those that put less stress on your recovering back. Always use proper lifting techniques, including: °· Bending your knees. °· Keeping the load close to your body. °· Avoiding twisting. °· Find a comfortable position to sleep. Use a firm mattress and lie on your side with your knees slightly bent. If you lie on your back, put a pillow under your knees. °· Avoid feeling anxious or stressed. Stress increases muscle tension and can worsen back pain. It is important to recognize when you are anxious or stressed and learn ways to manage it, such as with exercise. °· Take medicines only as directed by your health care provider. Over-the-counter  medicines to reduce pain and inflammation are often the most helpful. Your health care provider may prescribe muscle relaxant drugs. These medicines help dull your pain so you can more quickly return to your normal activities and healthy exercise. °· Apply ice to the injured area: °· Put ice in a plastic bag. °· Place a towel between your skin and the bag. °· Leave the ice on for 20 minutes, 2-3 times a day for the first 2-3 days. After that, ice and heat may be alternated to reduce pain and spasms. °· Maintain a healthy weight. Excess weight puts extra stress on your back and makes it difficult to maintain good posture. °SEEK MEDICAL CARE IF: °· You have pain that is not relieved with rest or medicine. °· You have increasing pain going down into the legs or buttocks. °· You have pain that does not improve in one week. °· You have night pain. °· You lose weight. °· You have a fever or chills. °SEEK IMMEDIATE MEDICAL CARE IF:  °· You develop new bowel or bladder control problems. °· You have unusual weakness or numbness in your arms or legs. °· You develop nausea or vomiting. °· You develop abdominal pain. °· You feel faint. °  °This information is not intended to replace advice given to you by your health care provider. Make sure you discuss any questions you have with your health care provider. °  °Document Released: 01/26/2005 Document Revised: 02/16/2014 Document Reviewed: 05/30/2013 °Elsevier Interactive Patient Education ©2016 Elsevier   Inc. DASH Eating Plan DASH stands for "Dietary Approaches to Stop Hypertension." The DASH eating plan is a healthy eating plan that has been shown to reduce high blood pressure (hypertension). Additional health benefits may include reducing the risk of type 2 diabetes mellitus, heart disease, and stroke. The DASH eating plan may also help with weight loss. WHAT DO I NEED TO KNOW ABOUT THE DASH EATING PLAN? For the DASH eating plan, you will follow these general  guidelines:  Choose foods with a percent daily value for sodium of less than 5% (as listed on the food label).  Use salt-free seasonings or herbs instead of table salt or sea salt.  Check with your health care provider or pharmacist before using salt substitutes.  Eat lower-sodium products, often labeled as "lower sodium" or "no salt added."  Eat fresh foods.  Eat more vegetables, fruits, and low-fat dairy products.  Choose whole grains. Look for the word "whole" as the first word in the ingredient list.  Choose fish and skinless chicken or [place] more often than red meat. Limit fish, poultry, and meat to 6 oz (170 g) each day.  Limit sweets, desserts, sugars, and sugary drinks.  Choose heart-healthy fats.  Limit cheese to 1 oz (28 g) per day.  Eat more home-cooked food and less restaurant, buffet, and fast food.  Limit fried foods.  Cook foods using methods other than frying.  Limit canned vegetables. If you do use them, rinse them well to decrease the sodium.  When eating at a restaurant, ask that your food be prepared with less salt, or no salt if possible. WHAT FOODS CAN I EAT? Seek help from a dietitian for individual calorie needs. Grains Whole grain or whole wheat bread. Brown rice. Whole grain or whole wheat pasta. Quinoa, bulgur, and whole grain cereals. Low-sodium cereals. Corn or whole wheat flour tortillas. Whole grain cornbread. Whole grain crackers. Low-sodium crackers. Vegetables Fresh or frozen vegetables (raw, steamed, roasted, or grilled). Low-sodium or reduced-sodium tomato and vegetable juices. Low-sodium or reduced-sodium tomato sauce and paste. Low-sodium or reduced-sodium canned vegetables.  Fruits All fresh, canned (in natural juice), or frozen fruits. Meat and Other Protein Products Ground beef (85% or leaner), grass-fed beef, or beef trimmed of fat. Skinless chicken or [place]. Ground chicken or [place]. Pork trimmed of fat. All fish and seafood.  Eggs. Dried beans, peas, or lentils. Unsalted nuts and seeds. Unsalted canned beans. Dairy Low-fat dairy products, such as skim or 1% milk, 2% or reduced-fat cheeses, low-fat ricotta or cottage cheese, or plain low-fat yogurt. Low-sodium or reduced-sodium cheeses. Fats and Oils Tub margarines without trans fats. Light or reduced-fat mayonnaise and salad dressings (reduced sodium). Avocado. Safflower, olive, or canola oils. Natural peanut or almond butter. Other Unsalted popcorn and pretzels. The items listed above may not be a complete list of recommended foods or beverages. Contact your dietitian for more options. WHAT FOODS ARE NOT RECOMMENDED? Grains White bread. White pasta. White rice. Refined cornbread. Bagels and croissants. Crackers that contain trans fat. Vegetables Creamed or fried vegetables. Vegetables in a cheese sauce. Regular canned vegetables. Regular canned tomato sauce and paste. Regular tomato and vegetable juices. Fruits Dried fruits. Canned fruit in light or heavy syrup. Fruit juice. Meat and Other Protein Products Fatty cuts of meat. Ribs, chicken wings, bacon, sausage, bologna, salami, chitterlings, fatback, hot dogs, bratwurst, and packaged luncheon meats. Salted nuts and seeds. Canned beans with salt. Dairy Whole or 2% milk, cream, half-and-half, and cream cheese. Whole-fat or sweetened  yogurt. Full-fat cheeses or blue cheese. Nondairy creamers and whipped toppings. Processed cheese, cheese spreads, or cheese curds. Condiments Onion and garlic salt, seasoned salt, table salt, and sea salt. Canned and packaged gravies. Worcestershire sauce. Tartar sauce. Barbecue sauce. Teriyaki sauce. Soy sauce, including reduced sodium. Steak sauce. Fish sauce. Oyster sauce. Cocktail sauce. Horseradish. Ketchup and mustard. Meat flavorings and tenderizers. Bouillon cubes. Hot sauce. Tabasco sauce. Marinades. Taco seasonings. Relishes. Fats and Oils Butter, stick margarine, lard,  shortening, ghee, and bacon fat. Coconut, palm kernel, or palm oils. Regular salad dressings. Other Pickles and olives. Salted popcorn and pretzels. The items listed above may not be a complete list of foods and beverages to avoid. Contact your dietitian for more information. WHERE CAN I FIND MORE INFORMATION? National Heart, Lung, and Blood Institute: travelstabloid.com   This information is not intended to replace advice given to you by your health care provider. Make sure you discuss any questions you have with your health care provider.   Document Released: 01/15/2011 Document Revised: 02/16/2014 Document Reviewed: 11/30/2012 Elsevier Interactive Patient Education 2016 [employer]. Hypertension Hypertension, commonly called high blood pressure, is when the force of blood pumping through your arteries is too strong. Your arteries are the blood vessels that carry blood from your heart throughout your body. A blood pressure reading consists of a higher number over a lower number, such as 110/72. The higher number (systolic) is the pressure inside your arteries when your heart pumps. The lower number (diastolic) is the pressure inside your arteries when your heart relaxes. Ideally you want your blood pressure below 120/80. Hypertension forces your heart to work harder to pump blood. Your arteries may become narrow or stiff. Having untreated or uncontrolled hypertension can cause heart attack, stroke, kidney disease, and other problems. RISK FACTORS Some risk factors for high blood pressure are controllable. Others are not.  Risk factors you cannot control include:   Race. You may be at higher risk if you are African American.  Age. Risk increases with age.  Gender. Men are at higher risk than women before age 74 years. After age 46, women are at higher risk than men. Risk factors you can control include:  Not getting enough exercise or physical  activity.  Being overweight.  Getting too much fat, sugar, calories, or salt in your diet.  Drinking too much alcohol. SIGNS AND SYMPTOMS Hypertension does not usually cause signs or symptoms. Extremely high blood pressure (hypertensive crisis) may cause headache, anxiety, shortness of breath, and nosebleed. DIAGNOSIS To check if you have hypertension, your health care provider will measure your blood pressure while you are seated, with your arm held at the level of your heart. It should be measured at least twice using the same arm. Certain conditions can cause a difference in blood pressure between your right and left arms. A blood pressure reading that is higher than normal on one occasion does not mean that you need treatment. If it is not clear whether you have high blood pressure, you may be asked to return on a different day to have your blood pressure checked again. Or, you may be asked to monitor your blood pressure at home for 1 or more weeks. TREATMENT Treating high blood pressure includes making lifestyle changes and possibly taking medicine. Living a healthy lifestyle can help lower high blood pressure. You may need to change some of your habits. Lifestyle changes may include:  Following the DASH diet. This diet is high in fruits, vegetables, and  whole grains. It is low in salt, red meat, and added sugars.  Keep your sodium intake below 2,300 mg per day.  Getting at least 30-45 minutes of aerobic exercise at least 4 times per week.  Losing weight if necessary.  Not smoking.  Limiting alcoholic beverages.  Learning ways to reduce stress. Your health care provider may prescribe medicine if lifestyle changes are not enough to get your blood pressure under control, and if one of the following is true:  You are 65-37 years of age and your systolic blood pressure is above 140.  You are 59 years of age or older, and your systolic blood pressure is above 150.  Your diastolic  blood pressure is above 90.  You have diabetes, and your systolic blood pressure is over [id number] or your diastolic blood pressure is over 90.  You have kidney disease and your blood pressure is above 140/90.  You have heart disease and your blood pressure is above 140/90. Your personal target blood pressure may vary depending on your medical conditions, your age, and other factors. HOME CARE INSTRUCTIONS  Have your blood pressure rechecked as directed by your health care provider.   Take medicines only as directed by your health care provider. Follow the directions carefully. Blood pressure medicines must be taken as prescribed. The medicine does not work as well when you skip doses. Skipping doses also puts you at risk for problems.  Do not smoke.   Monitor your blood pressure at home as directed by your health care provider. SEEK MEDICAL CARE IF:   You think you are having a reaction to medicines taken.  You have recurrent headaches or feel dizzy.  You have swelling in your ankles.  You have trouble with your vision. SEEK IMMEDIATE MEDICAL CARE IF:  You develop a severe headache or confusion.  You have unusual weakness, numbness, or feel faint.  You have severe chest or abdominal pain.  You vomit repeatedly.  You have trouble breathing. MAKE SURE YOU:   Understand these instructions.  Will watch your condition.  Will get help right away if you are not doing well or get worse.   This information is not intended to replace advice given to you by your health care provider. Make sure you discuss any questions you have with your health care provider.   Document Released: 01/26/2005 Document Revised: 06/12/2014 Document Reviewed: 11/18/2012 Elsevier Interactive Patient Education [employer].

## 2015-12-05 NOTE — Progress Notes (Signed)
Aaron Dennis, is a 60 y.o. male  EZ:[id number]  [id number]  DOB - [birth year]  No chief complaint on file.     Subjective:   Aaron Dennis is a 60 y.o. male with history of hypertension and chronic back pain here today for a follow up visit. He is complaining of severe back pain, mostly in the lower part and on the right, radiating to the right leg, shooting in nature, aggravated by prolonged walking, relieved by rest and pain medication. No weakness or fall. He also has pain in both knee joints attributed to his history of osteoarthritis. No swelling. No redness. No fever. Patient has hx of lung nodules that needed follow up imaging study, not yet done. Patient also need refill of his home medications. No recent trauma. Patient has No headache, No chest pain, No abdominal pain - No Nausea, No new weakness tingling or numbness, No Cough - SOB.  Problem  Chronic Pain of Both Knees  Low Back Pain At Multiple Sites   ALLERGIES: No Known Allergies  PAST MEDICAL HISTORY: Past Medical History:  Diagnosis Date  . Hypertension    MEDICATIONS AT HOME: Prior to Admission medications   Medication Sig Start Date End Date Taking? Authorizing Provider  acetaminophen-codeine (TYLENOL #3) 300-30 MG tablet Take 1 tablet by mouth every 4 (four) hours as needed. 12/05/15   Tresa Garter, MD  amLODipine (NORVASC) 5 MG tablet Take 1 tablet (5 mg total) by mouth daily. 12/05/15   Tresa Garter, MD  cyclobenzaprine (FLEXERIL) 5 MG tablet Take 1 tablet (5 mg total) by mouth 3 (three) times daily as needed for muscle spasms. 12/05/15   Tresa Garter, MD  fluticasone (FLONASE) 50 MCG/ACT nasal spray Place 2 sprays into both nostrils daily. 02/07/13   Reyne Dumas, MD  meloxicam (MOBIC) 7.5 MG tablet Take 1 tablet (7.5 mg total) by mouth daily. 12/05/15   Tresa Garter, MD  Multiple Vitamins-Minerals (CENTRUM SILVER ADULT 50+) TABS Take 1 tablet by mouth daily. 12/20/12   Tresa Garter, MD  omeprazole (PRILOSEC) 40 MG capsule Take 1 capsule (40 mg total) by mouth daily. 12/05/15   Tresa Garter, MD   Objective:  There were no vitals filed for this visit. Exam General appearance : Awake, alert, not in any distress. Speech Clear. Not toxic looking HEENT: Atraumatic and Normocephalic, pupils equally reactive to light and accomodation Neck: Supple, no JVD. No cervical lymphadenopathy.  Chest: Good air entry bilaterally, no added sounds  CVS: S1 S2 regular, no murmurs.  Abdomen: Bowel sounds present, Non tender and not distended with no gaurding, rigidity or rebound. Extremities: B/L Lower Ext shows no edema, both legs are warm to touch Neurology: Awake alert, and oriented X 3, CN II-XII intact, Non focal Skin: No Rash  Data Review  CT NECK of 06/22/2013  IMPRESSION: No acute process within the neck or specific findings to explain cough.  1 mm mucosal nodularity of the right true cord favored to reflect artifact though, if clinically indicated, direct inspection could be made.  Ectatic appearance of the right carotid terminus incompletely characterized, there is a background of dolicoectatic Intracranial vessels which can be seen with chronic hypertension.  Diffusely sclerotic bone mineral density (as reported on prior MRI), though nonspecific, and could reflect chronic metabolic disease or, possibly metastasis, recommend clinical correlation.  MRI Lumbar Spine of 08/27/2011  IMPRESSION:  1.  Stable shallow central disc protrusion at L5-S1.  This is contained within the  ventral epidural fat and causes no sacral nerve root displacement. 2.  Mildly progressive disc bulging facet disease elsewhere in the lumbar spine without resulting spinal stenosis or nerve root encroachment. 3.  Stable sacral Tarlov cyst. 4.  Mild enlargement of a hemangioma within the L2 vertebral body.  Assessment & Plan   1. Gastroesophageal reflux disease without  esophagitis Refill - omeprazole (PRILOSEC) 40 MG capsule; Take 1 capsule (40 mg total) by mouth daily.  Dispense: 90 capsule; Refill: 3  2. Essential hypertension  - amLODipine (NORVASC) 5 MG tablet; Take 1 tablet (5 mg total) by mouth daily.  Dispense: 90 tablet; Refill: 3 - COMPLETE METABOLIC PANEL WITH GFR - TSH  We have discussed target BP range and blood pressure goal. I have advised patient to check BP regularly and to call us back or report to clinic if the numbers are consistently higher than 140/90. We discussed the importance of compliance with medical therapy and DASH diet recommended, consequences of uncontrolled hypertension discussed.  - continue current BP medications  3. Low back pain at multiple sites Prescribed - meloxicam (MOBIC) 7.5 MG tablet; Take 1 tablet (7.5 mg total) by mouth daily.  Dispense: 60 tablet; Refill: 2 - cyclobenzaprine (FLEXERIL) 5 MG tablet; Take 1 tablet (5 mg total) by mouth 3 (three) times daily as needed for muscle spasms.  Dispense: 60 tablet; Refill: 3 - acetaminophen-codeine (TYLENOL #3) 300-30 MG tablet; Take 1 tablet by mouth every 4 (four) hours as needed.  Dispense: 60 tablet; Refill: 0  - DG Lumbar Spine Complete; Future - CBC with Differential/Platelet - Lipid panel - Urinalysis, Complete - VITAMIN D 25 Hydroxy (Vit-D Deficiency, Fractures)  4. Chronic pain of both knees  - acetaminophen-codeine (TYLENOL #3) 300-30 MG tablet; Take 1 tablet by mouth every 4 (four) hours as needed.  Dispense: 60 tablet; Refill: 0 - CBC with Differential/Platelet  5. Lung nodule < 6cm on CT  - CT CHEST NODULE FOLLOW UP LOW DOSE W/O; Future  Patient have been counseled extensively about nutrition and exercise. Other issues discussed during this visit include: low cholesterol diet, weight control and daily exercise, importance of adherence with medications and regular follow-up. We also discussed long term complications of uncontrolled hypertension.    Return in about 3 months (around 03/06/2016) for Follow up Pain and comorbidities.  The patient was given clear instructions to go to ER or return to medical center if symptoms don't improve, worsen or new problems develop. The patient verbalized understanding. The patient was told to call to get lab results if they haven't heard anything in the next week.   This note has been created with Surveyor, quantity. Any transcriptional errors are unintentional.    Angelica Chessman, MD, [place], [place], [place], [place] and [facility] [place], [place]   12/05/2015, 11:02 AM

## 2015-12-06 ENCOUNTER — Ambulatory Visit: Payer: Self-pay | Attending: Internal Medicine

## 2015-12-06 LAB — VITAMIN D 25 HYDROXY (VIT D DEFICIENCY, FRACTURES): VIT D 25 HYDROXY: 8 ng/mL — AB (ref 30–100)

## 2015-12-10 ENCOUNTER — Other Ambulatory Visit: Payer: Self-pay | Admitting: Internal Medicine

## 2015-12-10 ENCOUNTER — Telehealth: Payer: Self-pay | Admitting: Internal Medicine

## 2015-12-10 ENCOUNTER — Telehealth: Payer: Self-pay | Admitting: *Deleted

## 2015-12-10 LAB — URINALYSIS, COMPLETE
BACTERIA UA: NONE SEEN [HPF]
BILIRUBIN URINE: NEGATIVE
CASTS: NONE SEEN [LPF]
Glucose, UA: NEGATIVE
Hgb urine dipstick: NEGATIVE
KETONES UR: NEGATIVE
Leukocytes, UA: NEGATIVE
Nitrite: NEGATIVE
Protein, ur: NEGATIVE
RBC / HPF: NONE SEEN RBC/HPF (ref <=2)
SQUAMOUS EPITHELIAL / LPF: NONE SEEN [HPF] (ref <=5)
Specific Gravity, Urine: 1.025 (ref 1.001–1.035)
WBC, UA: NONE SEEN WBC/HPF (ref <=5)
Yeast: NONE SEEN [HPF]
pH: 6 (ref 5.0–8.0)

## 2015-12-10 MED ORDER — VITAMIN D (ERGOCALCIFEROL) 1.25 MG (50000 UNIT) PO CAPS: 50000.0000 [IU] | ORAL_CAPSULE | ORAL | 0 refills | Status: DC

## 2015-12-10 NOTE — Telephone Encounter (Signed)
MA spoke with scheduler Peggy and confirmed patients orders being in the system. Patient was contacted back to assure him of scheduler being available to schedule his CT. Patient appreciated the assistance.

## 2015-12-10 NOTE — Telephone Encounter (Signed)
Patient states he was informed to get a ct scan and or xray by our nurse.  Patient states he contacted [facility] and they informed him to contact our office for him to be referred to get ct scan.  Please follow up

## 2015-12-10 NOTE — Telephone Encounter (Signed)
-----   Message from Tresa Garter, MD sent at 12/10/2015 10:53 AM EDT ----- Please inform patient that his lab results showed very low vitamin D level, and high cholesterol. Vit D cap prescribed to the pharmacy for pick-up. To address the cholesterol issue, please limit saturated fat to no more than 7% of your calories, limit cholesterol to 200 mg/day, increase fiber and exercise as tolerated. If needed we may add a cholesterol lowering medication to your regimen. We await the CT and Xray reports.

## 2015-12-10 NOTE — Telephone Encounter (Signed)
Patient verified DOB Patient is aware of lab results showing low vitamin d level and high cholesterol. Patient is aware of vitamin d being prescribed to the [facility] pharmacy and patient will take one every 7 days for 3 months. Patient is aware of limiting saturated fats and cholesterol intake while increasing fiber and exercise. Patient is aware of recheck being completed for both in 3 months. Patient expressed his understanding and states he will pick up the medication tomorrow.

## 2015-12-13 MED FILL — ACETAMINOPHEN/COD #3 TABLET: 300-30 | 30 days supply | Qty: 30 | Fill #0

## 2015-12-17 ENCOUNTER — Ambulatory Visit (HOSPITAL_COMMUNITY)
Admission: RE | Admit: 2015-12-17 | Discharge: 2015-12-17 | Disposition: A | Discharge status: Home / Self Care | Payer: Self-pay | Source: Ambulatory Visit | Attending: Internal Medicine | Admitting: Internal Medicine

## 2015-12-17 DIAGNOSIS — M899 Disorder of bone, unspecified: Secondary | ICD-10-CM | POA: Insufficient documentation

## 2015-12-17 DIAGNOSIS — R911 Solitary pulmonary nodule: Secondary | ICD-10-CM | POA: Insufficient documentation

## 2015-12-17 DIAGNOSIS — IMO0001 Reserved for inherently not codable concepts without codable children: Secondary | ICD-10-CM

## 2015-12-17 DIAGNOSIS — M545 Low back pain, unspecified: Secondary | ICD-10-CM

## 2015-12-17 DIAGNOSIS — I2510 Atherosclerotic heart disease of native coronary artery without angina pectoris: Secondary | ICD-10-CM | POA: Insufficient documentation

## 2015-12-17 DIAGNOSIS — M47896 Other spondylosis, lumbar region: Secondary | ICD-10-CM | POA: Insufficient documentation

## 2015-12-18 ENCOUNTER — Other Ambulatory Visit: Payer: Self-pay | Admitting: Internal Medicine

## 2015-12-18 DIAGNOSIS — R911 Solitary pulmonary nodule: Secondary | ICD-10-CM

## 2015-12-18 NOTE — Telephone Encounter (Signed)
Patient verified DOB Patient is aware of CT showing pulmonary nodules and thoracic lesion.  Patient is aware of referral being placed to cardiothoracic surgeon for a further workup. Patient expressed his understanding and will await the phone call to schedule his appointment. No further questions at this time.

## 2015-12-18 NOTE — Telephone Encounter (Signed)
-----   Message from Tresa Garter, MD sent at 12/18/2015 12:37 PM EST ----- Please inform patient that the CT chest showed a pulmonary nodule and a chronic but stable thoracic bone lesion that needs further evaluation by specialist. Will refer to a Cardiothoracic Surgeon.

## 2015-12-19 MED FILL — VIT D2 1.25 MG (50,000 UNIT: 1.25 MG | 84 days supply | Qty: 12 | Fill #0

## 2015-12-19 MED FILL — CYCLOBENZAPRINE 5 MG TABLET: 5 | 30 days supply | Qty: 60 | Fill #0

## 2015-12-26 ENCOUNTER — Encounter: Payer: Self-pay | Admitting: Thoracic Surgery (Cardiothoracic Vascular Surgery)

## 2015-12-26 ENCOUNTER — Institutional Professional Consult (permissible substitution) (INDEPENDENT_AMBULATORY_CARE_PROVIDER_SITE_OTHER): Payer: No Typology Code available for payment source | Admitting: Thoracic Surgery (Cardiothoracic Vascular Surgery)

## 2015-12-26 VITALS — BP 150/90 | HR 79 | Resp 20 | Ht 66.0 in | Wt 188.0 lb

## 2015-12-26 DIAGNOSIS — R911 Solitary pulmonary nodule: Secondary | ICD-10-CM

## 2015-12-26 NOTE — Progress Notes (Signed)
PCP is Angelica Chessman, MD Referring Provider is Tresa Garter, MD  Chief Complaint  Patient presents with  . Lung Lesion    Surgical eval, Chest CT 12/17/2015    HPI: Aaron Dennis is a 60 yo man with a history of a small left lower lobe lung nodule, smoking hooka and newly diagnosed Vitamin D deficiency.   He had a respiratory infection back in 2015. He had a CT chest which showed a small left lower lobe nodule. He was advised to have a repeat CT in 6 months but was lost to follow up.  He recently went to see Dr. Doreene Burke re: back pain with sciatica. A CT was ordered. It was reported the nodule was increased in size. He also was noted to have diffuse bone abnormalities.  He smokes a hooka pipe at least once a week. He has done this on and off for many years  Past Medical History:  Diagnosis Date  . Hypertension   Vitamin D deficiency  No past surgical history on file.  No family history on file. Denies family history of heart or pulmonary disease.  Social History Social History  Substance Use Topics  . Smoking status: Never Smoker  . Smokeless tobacco: Not on file  . Alcohol use No  Smokes Hooka  Current Outpatient Prescriptions  Medication Sig Dispense Refill  . acetaminophen-codeine (TYLENOL #3) 300-30 MG tablet Take 1 tablet by mouth every 4 (four) hours as needed. 60 tablet 0  . amLODipine (NORVASC) 5 MG tablet Take 1 tablet (5 mg total) by mouth daily. 90 tablet 3  . cyclobenzaprine (FLEXERIL) 5 MG tablet Take 1 tablet (5 mg total) by mouth 3 (three) times daily as needed for muscle spasms. 60 tablet 3  . fluticasone (FLONASE) 50 MCG/ACT nasal spray Place 2 sprays into both nostrils daily. 16 g 6  . meloxicam (MOBIC) 7.5 MG tablet Take 1 tablet (7.5 mg total) by mouth daily. 60 tablet 2  . Multiple Vitamins-Minerals (CENTRUM SILVER ADULT 50+) TABS Take 1 tablet by mouth daily. 60 tablet 2  . omeprazole (PRILOSEC) 40 MG capsule Take 1 capsule (40 mg total) by mouth  daily. 90 capsule 3  . Vitamin D, Ergocalciferol, (DRISDOL) 50000 units CAPS capsule Take 1 capsule (50,000 Units total) by mouth every 7 (seven) days. 30 capsule 0   No current facility-administered medications for this visit.     No Known Allergies  Review of Systems  Constitutional: Negative for activity change, appetite change and unexpected weight change.  HENT: Negative for trouble swallowing and voice change.   Eyes: Negative for visual disturbance.       Wears glasses, no recent changes  Respiratory: Negative for cough, chest tightness, shortness of breath and wheezing.   Cardiovascular: Negative for chest pain and leg swelling.  Gastrointestinal: Positive for abdominal pain and constipation. Negative for blood in stool.  Genitourinary: Negative for difficulty urinating and dysuria.  Musculoskeletal: Positive for arthralgias, back pain and neck pain.  Neurological: Negative for syncope and headaches.  Psychiatric/Behavioral: The patient is nervous/anxious.     BP (!) 150/90 (BP Location: Left Arm, Patient Position: Sitting, Cuff Size: Normal)   Pulse 79   Resp 20   Ht 5\' 6"  (1.676 m)   Wt 188 lb (85.3 kg)   SpO2 98% Comment: RA  BMI 30.34 kg/m  Physical Exam  Constitutional: He is oriented to person, place, and time. He appears well-developed and well-nourished. No distress.  HENT:  Head: Normocephalic and  atraumatic.  Mouth/Throat: No oropharyngeal exudate.  Eyes: Conjunctivae and EOM are normal. No scleral icterus.  Neck: Neck supple. No thyromegaly present.  Cardiovascular: Normal rate, regular rhythm, normal heart sounds and intact distal pulses.   No murmur heard. Pulmonary/Chest: Effort normal and breath sounds normal. No respiratory distress. He has no wheezes. He has no rales.  Abdominal: Soft. He exhibits no distension. There is no tenderness.  Musculoskeletal: Normal range of motion. He exhibits no edema.  Lymphadenopathy:    He has no cervical adenopathy.   Neurological: He is alert and oriented to person, place, and time. No cranial nerve deficit.  No focal deficit  Skin: Skin is warm and dry.  Vitals reviewed.    Diagnostic Tests: CT CHEST WITHOUT CONTRAST  TECHNIQUE: Multidetector CT imaging of the chest was performed following the standard protocol without IV contrast.  COMPARISON:  06/22/2013 chest CT.  FINDINGS: Cardiovascular: Normal heart size. No significant pericardial fluid/thickening. Left anterior descending coronary atherosclerosis . Great vessels are normal in course and caliber.  Mediastinum/Nodes: No discrete thyroid nodules. Unremarkable esophagus. No pathologically enlarged axillary, mediastinal or gross hilar lymph nodes, noting limited sensitivity for the detection of hilar adenopathy on this noncontrast study.  Lungs/Pleura: No pneumothorax. No pleural effusion. Posterior left upper lobe 7 x 7 mm irregular pulmonary nodule (series 3/ image 69), previously 4 x 4 mm on 06/22/2013, increased in size. No acute consolidative airspace disease, lung masses or additional significant pulmonary nodules.  Upper abdomen: Unremarkable.  Musculoskeletal: Diffuse moth-eaten and sclerotic appearance of the bones throughout the thoracic skeleton, not appreciably changed. Moderate thoracic spondylosis.  IMPRESSION: 1. Interval growth of posterior left upper lobe irregular pulmonary nodule, now measuring 7 mm. Slow growing primary bronchogenic adenocarcinoma cannot be excluded. Thoracic surgical consultation and follow-up non-contrast chest CT at 6 months is recommended. 2. No thoracic adenopathy. 3. Stable chronic diffuse moth-eaten and sclerotic appearance of the thoracic skeleton, nonspecific. A chronic infiltrative marrow process such as myelofibrosis or chronic metabolic bone disorder is suspected. 4. One vessel coronary atherosclerosis.   Electronically Signed   By: Ilona Sorrel M.D.   On:  12/17/2015 17:29  I personally reviewed the CT chest and compared it to the scan form May 2015. There is an obvious difference in the quality of the scans with the more recent scan being poorer quality low dose screening. When taking difference in resolution into account, I'm not convinced the nodule has changed significantly. At the very most the nodule is a mm or so bigger than it was 30 months ago.  Impression: 60 yo man with a history of tobacco abuse (hooka) who has a small nodule in the superior segment of the left lower lobe. It has been present since at least 2015. It may have grown minimally over the past 2.5 years, but it is not impressive. When I compare side by side the measurements I get are no more than a mm apart, particularly on the coronal views. I can't reliably find it on the sagittals.   At this point I think either resection or observation are reasonable alternatives. I do think he needs to be seen sooner than another 2.5 years. After our discussion he wishes to return in 6 months with a repeat CT chest. Will do a high resolution scan.  Tobacco abuse- stressed the importance of abstinence from tobacco.  Bony abnormalities likely related to newly diagnosed vitamin D deficiency- he is on ergocalciferol.  Plan: Return in 6 months with CT chest.  Melrose Nakayama, MD [facility] [phone]

## 2016-01-14 MED FILL — AMLODIPINE BESYLATE 5 MG TA: 5 | 90 days supply | Qty: 90 | Fill #1

## 2016-01-14 MED FILL — OMEPRAZOLE DR 40 MG CAPSULE: 40 | 90 days supply | Qty: 90 | Fill #1

## 2016-05-25 MED FILL — AMLODIPINE BESYLATE 5 MG TA: 5 | 90 days supply | Qty: 90 | Fill #2

## 2016-05-25 MED FILL — OMEPRAZOLE DR 40 MG CAPSULE: 40 | 90 days supply | Qty: 90 | Fill #2

## 2016-05-27 ENCOUNTER — Other Ambulatory Visit: Payer: Self-pay | Admitting: Thoracic Surgery (Cardiothoracic Vascular Surgery)

## 2016-05-27 DIAGNOSIS — R918 Other nonspecific abnormal finding of lung field: Secondary | ICD-10-CM

## 2016-06-09 ENCOUNTER — Ambulatory Visit: Payer: Self-pay | Attending: Internal Medicine

## 2016-06-23 ENCOUNTER — Ambulatory Visit
Admission: RE | Admit: 2016-06-23 | Discharge: 2016-06-23 | Disposition: A | Discharge status: Home / Self Care | Payer: No Typology Code available for payment source | Source: Ambulatory Visit | Attending: Thoracic Surgery (Cardiothoracic Vascular Surgery) | Admitting: Thoracic Surgery (Cardiothoracic Vascular Surgery)

## 2016-06-23 ENCOUNTER — Encounter: Payer: Self-pay | Admitting: Thoracic Surgery (Cardiothoracic Vascular Surgery)

## 2016-06-23 ENCOUNTER — Ambulatory Visit (INDEPENDENT_AMBULATORY_CARE_PROVIDER_SITE_OTHER): Payer: No Typology Code available for payment source | Admitting: Thoracic Surgery (Cardiothoracic Vascular Surgery)

## 2016-06-23 VITALS — BP 124/83 | HR 76 | Resp 16 | Ht 66.0 in | Wt 186.0 lb

## 2016-06-23 DIAGNOSIS — R918 Other nonspecific abnormal finding of lung field: Secondary | ICD-10-CM

## 2016-06-23 DIAGNOSIS — R911 Solitary pulmonary nodule: Secondary | ICD-10-CM

## 2016-06-23 NOTE — Progress Notes (Addendum)
[address]       [place] [address]             [phone]    HPI: Aaron Dennis returns for follow-up of a left upper lobe lung nodule.  He is a 61 year old man with a history of a lung nodule, smoking (Hooka), and vitamin D deficiency. He had a respiratory infection in 2015. A CT of the chest showed a small left lung nodule. He was advised to have a follow-up CT but was lost to follow-up. About 6 months ago he went to his doctor with complaints of back pain. A CT was ordered to follow-up the nodule. It had increased in size and he was referred to me.  I saw him in November 2017. We discussed surgical resection versus radiographic observation. He did not want to have surgery and opted for observation.  In the interim since his last visit he continues to use hooka. He says he is down to once a week and plans to stop after this week. He denies any cough, hemoptysis, shortness of breath, or wheezing. His appetite is good and his weight is stable.  Past Medical History:  Diagnosis Date  . Hypertension     Current Outpatient Prescriptions  Medication Sig Dispense Refill  . acetaminophen-codeine (TYLENOL #3) 300-30 MG tablet Take 1 tablet by mouth every 4 (four) hours as needed. 60 tablet 0  . amLODipine (NORVASC) 5 MG tablet Take 1 tablet (5 mg total) by mouth daily. 90 tablet 3  . cyclobenzaprine (FLEXERIL) 5 MG tablet Take 1 tablet (5 mg total) by mouth 3 (three) times daily as needed for muscle spasms. 60 tablet 3  . fluticasone (FLONASE) 50 MCG/ACT nasal spray Place 2 sprays into both nostrils daily. 16 g 6  . meloxicam (MOBIC) 7.5 MG tablet Take 1 tablet (7.5 mg total) by mouth daily. 60 tablet 2  . Multiple Vitamins-Minerals (CENTRUM SILVER ADULT 50+) TABS Take 1 tablet by mouth daily. 60 tablet 2  . omeprazole (PRILOSEC) 40 MG capsule Take 1 capsule (40 mg total) by mouth daily. 90 capsule 3  . Vitamin D, Ergocalciferol, (DRISDOL) 50000 units CAPS capsule Take 1  capsule (50,000 Units total) by mouth every 7 (seven) days. 30 capsule 0   No current facility-administered medications for this visit.     Physical Exam BP 124/83 (BP Location: Right Arm, Patient Position: Sitting, Cuff Size: Large)   Pulse 76   Resp 16   Ht 5\' 6"  (1.676 m)   Wt 186 lb (84.4 kg)   SpO2 96% Comment: ON RA  BMI 30.47 kg/m  61 year old man in no acute distress Alert and oriented 3 with no focal deficits No cervical or supraclavicular adenopathy Cardiac regular rate and rhythm normal S1 and S2 Lungs clear with equal breath sounds bilaterally No clubbing cyanosis or edema  Diagnostic Tests: CT CHEST WITHOUT CONTRAST  TECHNIQUE: Multidetector CT imaging of the chest was performed following the standard protocol without IV contrast.  COMPARISON:  12/17/2015 chest CT.  FINDINGS: Cardiovascular: Normal heart size. No significant pericardial fluid/thickening. Left anterior descending, left circumflex and right coronary atherosclerosis. Great vessels are normal in course and caliber.  Mediastinum/Nodes: No discrete thyroid nodules. Unremarkable esophagus. No pathologically enlarged axillary, mediastinal or gross hilar lymph nodes, noting limited sensitivity for the detection of hilar adenopathy on this noncontrast study.  Lungs/Pleura: No pneumothorax. No pleural effusion. Posterior left upper lobe 7 x 6 mm irregular pulmonary nodule (series  4/ image 31), stable since 12/17/2015. No acute consolidative airspace disease, lung masses or additional significant pulmonary nodules.  Upper abdomen: Right adrenal 1.1 cm adenoma (series 3/ image 115) with density 13 HU is stable since 06/22/2013.  Musculoskeletal: Stable chronic diffuse moth-eaten and sclerotic appearance of the bones throughout the thoracic skeleton. No discrete bone lesion. Moderate thoracic spondylosis.  IMPRESSION: 1. Interval stability of 7 mm left upper lobe irregular  pulmonary nodule, for which 6 month stability has been demonstrated. Follow-up noncontrast chest CT recommended in 12-18 months. This recommendation follows the consensus statement: Guidelines for Management of Incidental Pulmonary Nodules Detected on CT Images: From the Fleischner Society 2017; Radiology 2017; 284:228-243. 2. No acute pulmonary disease. 3. Three-vessel coronary atherosclerosis. 4. Stable right adrenal adenoma. 5. Stable nonspecific chronic mixed lytic and sclerotic changes throughout the thoracic skeleton, probably due to myelofibrosis or metabolic bone disorder.   Electronically Signed   By: Ilona Sorrel M.D.   On: 06/23/2016 14:38 I personally reviewed the Ct chest images and concurwith the findings noted above.  Impression: Aaron Dennis is a 61 year old gentleman with a history of tobacco abuse who has a 7 mm left upper lobe nodule. This has not changed over the past 6 months.  I recommended that he return in 6 months with a repeat CT scan. He says that he cannot return in 6 months. He will be out of the country visiting his family in [place] and wants to do the scan in 1 year. Given the interval stability I think that is reasonable, but he does understand there is a possibility that this is a cancer and that it could change significantly in that timeframe.  Plan: Return in one year CT for follow-up of LUL nodule  Melrose Nakayama, MD [facility] [phone]  Note reviewed. No changes made

## 2016-08-05 ENCOUNTER — Ambulatory Visit: Payer: Self-pay | Attending: Internal Medicine | Admitting: Internal Medicine

## 2016-08-05 ENCOUNTER — Encounter: Payer: Self-pay | Admitting: Internal Medicine

## 2016-08-05 VITALS — BP 122/72 | HR 81 | Temp 98.2°F | Resp 18 | Ht 66.0 in | Wt 182.6 lb

## 2016-08-05 DIAGNOSIS — R911 Solitary pulmonary nodule: Secondary | ICD-10-CM | POA: Insufficient documentation

## 2016-08-05 DIAGNOSIS — Q782 Osteopetrosis: Secondary | ICD-10-CM | POA: Insufficient documentation

## 2016-08-05 DIAGNOSIS — M79604 Pain in right leg: Secondary | ICD-10-CM | POA: Insufficient documentation

## 2016-08-05 DIAGNOSIS — M545 Low back pain: Secondary | ICD-10-CM | POA: Insufficient documentation

## 2016-08-05 DIAGNOSIS — G8929 Other chronic pain: Secondary | ICD-10-CM | POA: Insufficient documentation

## 2016-08-05 DIAGNOSIS — I10 Essential (primary) hypertension: Secondary | ICD-10-CM | POA: Insufficient documentation

## 2016-08-05 MED FILL — AMLODIPINE BESYLATE 5 MG TA: 5 | 90 days supply | Qty: 90 | Fill #3

## 2016-08-05 NOTE — Patient Instructions (Signed)

## 2016-08-05 NOTE — Progress Notes (Signed)
Aaron Dennis, is a 61 y.o. male  AST:[id number]  LGX:[id number]  DOB - 1955-08-16  No chief complaint on file.     Subjective:   Aaron Dennis is a 61 y.o. male with history of hypertension and chronic back pain here today for a sick visit. He is complaining of severe back pain, mostly in the lower part and on the right, radiating to the right leg, shooting in nature, aggravated by prolonged walking, relieved by rest and pain medication. No weakness or fall. No hx of trauma. Patient is concerned this may be malignancy. Previous CT Chest showed: "Stable nonspecific chronic mixed lytic and sclerotic changes throughout the thoracic skeleton, probably due to myelofibrosis or metabolic bone disorder." Patient is recommended to do further testing. He has no personal or family history of malignancy, although he has a stable left upper lobe irregular pulmonary nodule, for which 6 month stability has been demonstrated. No weight loss, no night sweat. Patient has No headache, No chest pain, No abdominal pain - No Nausea, No new weakness tingling or numbness, No Cough - SOB.  Problem  Bony Sclerosis   ALLERGIES: No Known Allergies  PAST MEDICAL HISTORY: Past Medical History:  Diagnosis Date  . Hypertension   . Lung nodule    7 mm LUL nodule    MEDICATIONS AT HOME: Prior to Admission medications   Medication Sig Start Date End Date Taking? Authorizing Provider  amLODipine (NORVASC) 5 MG tablet Take 1 tablet (5 mg total) by mouth daily. 12/05/15   Tresa Garter, MD  omeprazole (PRILOSEC) 40 MG capsule Take 1 capsule (40 mg total) by mouth daily. 12/05/15   Tresa Garter, MD   Objective:   Vitals:   08/05/16 1500  BP: 122/72  Pulse: 81  Resp: 18  Temp: 98.2 F (36.8 C)  TempSrc: Oral  SpO2: 98%  Weight: 182 lb 9.6 oz (82.8 kg)  Height: 5\' 6"  (1.676 m)   Exam General appearance : Awake, alert, not in any distress. Speech Clear. Not toxic looking HEENT: Atraumatic and  Normocephalic, pupils equally reactive to light and accomodation Neck: Supple, no JVD. No cervical lymphadenopathy.  Chest: Good air entry bilaterally, no added sounds  CVS: S1 S2 regular, no murmurs.  Abdomen: Bowel sounds present, Non tender and not distended with no gaurding, rigidity or rebound. Extremities: B/L Lower Ext shows no edema, both legs are warm to touch Neurology: Awake alert, and oriented X 3, CN II-XII intact, Non focal Skin: No Rash  Data Review No results found for: HGBA1C  Assessment & Plan   1. Essential hypertension  We have discussed target BP range and blood pressure goal. I have advised patient to check BP regularly and to call us back or report to clinic if the numbers are consistently higher than 140/90. We discussed the importance of compliance with medical therapy and DASH diet recommended, consequences of uncontrolled hypertension discussed.  - continue current BP medications  2. Bony sclerosis with Pains  - NM PET Image Initial (PI) Skull Base To Thigh; Future  Patient have been counseled extensively about nutrition and exercise. Other issues discussed during this visit include: low cholesterol diet  Return in about 6 months (around 02/04/2017) for Follow up HTN, Follow up Pain and comorbidities.  The patient was given clear instructions to go to ER or return to medical center if symptoms don't improve, worsen or new problems develop. The patient verbalized understanding. The patient was told to call to get lab results  if they haven't heard anything in the next week.   This note has been created with Surveyor, quantity. Any transcriptional errors are unintentional.    Angelica Chessman, MD, [place], [place], [place], [place] and [place], [place]   08/05/2016, 3:08 PM

## 2016-08-19 ENCOUNTER — Ambulatory Visit (HOSPITAL_COMMUNITY): Payer: No Typology Code available for payment source

## 2017-01-12 ENCOUNTER — Ambulatory Visit: Payer: Self-pay | Attending: Internal Medicine

## 2017-02-22 ENCOUNTER — Other Ambulatory Visit: Payer: Self-pay | Admitting: Internal Medicine

## 2017-02-22 DIAGNOSIS — K219 Gastro-esophageal reflux disease without esophagitis: Secondary | ICD-10-CM

## 2017-02-22 DIAGNOSIS — I10 Essential (primary) hypertension: Secondary | ICD-10-CM

## 2017-02-22 MED FILL — OMEPRAZOLE DR 40 MG CAPSULE: 40 | 30 days supply | Qty: 30 | Fill #0

## 2017-02-22 MED FILL — ?AMLODIPINE BESYLATE 5 MG T: 5 MG | 30 days supply | Qty: 30 | Fill #0

## 2017-04-07 ENCOUNTER — Encounter: Payer: Self-pay | Admitting: Internal Medicine

## 2017-04-07 ENCOUNTER — Ambulatory Visit: Payer: Self-pay | Attending: Internal Medicine | Admitting: Internal Medicine

## 2017-04-07 VITALS — BP 132/87 | HR 77 | Temp 98.0°F | Resp 16 | Wt 189.2 lb

## 2017-04-07 DIAGNOSIS — Q782 Osteopetrosis: Secondary | ICD-10-CM

## 2017-04-07 DIAGNOSIS — F17200 Nicotine dependence, unspecified, uncomplicated: Secondary | ICD-10-CM | POA: Insufficient documentation

## 2017-04-07 DIAGNOSIS — K219 Gastro-esophageal reflux disease without esophagitis: Secondary | ICD-10-CM | POA: Insufficient documentation

## 2017-04-07 DIAGNOSIS — L309 Dermatitis, unspecified: Secondary | ICD-10-CM | POA: Insufficient documentation

## 2017-04-07 DIAGNOSIS — Z0000 Encounter for general adult medical examination without abnormal findings: Secondary | ICD-10-CM

## 2017-04-07 DIAGNOSIS — I10 Essential (primary) hypertension: Secondary | ICD-10-CM | POA: Insufficient documentation

## 2017-04-07 DIAGNOSIS — M8950 Osteolysis, unspecified site: Secondary | ICD-10-CM

## 2017-04-07 MED ORDER — OMEPRAZOLE 40 MG PO CPDR: 40.0000 mg | DELAYED_RELEASE_CAPSULE | Freq: Every day | ORAL | 0 refills | Status: DC

## 2017-04-07 MED ORDER — AMLODIPINE BESYLATE 5 MG PO TABS: 5.0000 mg | ORAL_TABLET | Freq: Every day | ORAL | 0 refills | Status: DC

## 2017-04-07 MED ORDER — ACETAMINOPHEN-CODEINE #3 300-30 MG PO TABS: 1.0000 | ORAL_TABLET | Freq: Four times a day (QID) | ORAL | 0 refills | Status: DC | PRN

## 2017-04-07 MED ORDER — TRIAMCINOLONE ACETONIDE 0.5 % EX OINT: 1.0000 "application " | TOPICAL_OINTMENT | Freq: Two times a day (BID) | CUTANEOUS | 0 refills | Status: DC

## 2017-04-07 MED FILL — TRIAMCINOLONE 0.5% OINTMENT: 0.5 | 15 days supply | Qty: 30 | Fill #0

## 2017-04-07 MED FILL — OMEPRAZOLE DR 40 MG CAPSULE: 40 | 30 days supply | Qty: 30 | Fill #0

## 2017-04-07 MED FILL — ?AMLODIPINE BESYLATE 5 MG T: 5 MG | 30 days supply | Qty: 30 | Fill #0

## 2017-04-07 NOTE — Progress Notes (Signed)
Head,Neck and back pain Right side and shoulder pain when he moves a certain  Referral to ophthalmology  Runny eye, constant wiping  Rash on right leg

## 2017-04-07 NOTE — Progress Notes (Signed)
Subjective:  Patient ID: Aaron Dennis, male    DOB: Jun 03, 1955  Age: 62 y.o. MRN: [id number]  CC: Annual follow-up  HPI Aaron Dennis is a 62 y/o male with medical history of HTN, GERD, Bony Sclerosis, and DDD. He presents today for annual follow-up and chronic back pain.   HTN: Patient adherent with medication regimen and heart healthy diet. Denies cp, sob, PND, orthopnea, cough, peripheral edema, claudication, or recurrent headache. Reports mild changes in vision. He needs referral to Ophthalmology.   Bony Sclerosis: Patient reports continued chronic neck and back pain (7-8/10). Pain is mildly controlled with Tylenol. Denies loss of bowel or bladder, generalized weakness, numbness/tingling, unsteady gait, or falls. Patient was scheduled to receive a PET scan to evaluate osteolysis. Patient states he was not able to schedule the exam. Noted in last visitation to have low Vitamin D 8 ng/mL (12/05/15). Last Lumbar imaging (12/17/15) suggest: Mild endplate spondylosis at multiple levels.   Healthcare Maintenance: Patient needs flu vaccination.   Past Medical History:  Diagnosis Date  . Hypertension   . Lung nodule    7 mm LUL nodule   No past surgical history on file.  Patient Active Problem List   Diagnosis Date Noted  . Bony sclerosis 08/05/2016  . Lung nodule   . Chronic pain of both knees 12/05/2015  . Gastroesophageal reflux disease without esophagitis 05/17/2014  . Essential hypertension 05/17/2014  . Coronary artery calcification 08/17/2012  . Chronic abdominal pain 06/24/2012  . Right-sided chest wall pain 06/24/2012  . Constipation, chronic 06/24/2012  . Hyperbilirubinemia 06/24/2012  . Chronic back pain 06/24/2012  . DDD (degenerative disc disease), thoracolumbar 06/24/2012  . History of nephrolithiasis 06/24/2012  . Side pain 06/24/2012  . Low back pain at multiple sites 06/24/2012   Social History   Socioeconomic History  . Marital status: Married    Spouse  name: Not on file  . Number of children: Not on file  . Years of education: Not on file  . Highest education level: Not on file  Social Needs  . Financial resource strain: Not on file  . Food insecurity - worry: Not on file  . Food insecurity - inability: Not on file  . Transportation needs - medical: Not on file  . Transportation needs - non-medical: Not on file  Occupational History  . Not on file  Tobacco Use  . Smoking status: Current Some Day Smoker  . Smokeless tobacco: Never Used  Substance and Sexual Activity  . Alcohol use: No  . Drug use: No  . Sexual activity: Not on file  Other Topics Concern  . Not on file  Social History Narrative  . Not on file   Outpatient Medications Prior to Visit  Medication Sig Dispense Refill  . amLODipine (NORVASC) 5 MG tablet TAKE 1 TABLET BY MOUTH DAILY. 30 tablet 0  . omeprazole (PRILOSEC) 40 MG capsule TAKE 1 CAPSULE BY MOUTH DAILY. 30 capsule 0   No facility-administered medications prior to visit.    No Known Allergies  ROS Review of Systems  Constitutional: Negative for activity change, appetite change, chills, fatigue, fever and unexpected weight change.  Eyes: Positive for visual disturbance.  Respiratory: Negative for cough, chest tightness and shortness of breath.   Cardiovascular: Negative for chest pain, palpitations and leg swelling.  Gastrointestinal: Negative for abdominal distention, abdominal pain, constipation, diarrhea, nausea and vomiting.  Musculoskeletal: Positive for back pain and neck pain. Negative for arthralgias, gait problem and myalgias.  Skin: Negative for color change, rash and wound.  Neurological: Negative for dizziness, weakness, light-headedness, numbness and headaches.  Psychiatric/Behavioral: Negative for behavioral problems, confusion, decreased concentration, sleep disturbance and suicidal ideas. The patient is not nervous/anxious.   All other systems reviewed and are negative.  Objective:    BP 132/87 (BP Location: Left Arm, Patient Position: Sitting, Cuff Size: Normal)   Pulse 77   Temp 98 F (36.7 C) (Oral)   Resp 16   Wt 189 lb 3.2 oz (85.8 kg)   SpO2 96%   BMI 30.54 kg/m   BP/Weight 04/07/2017 08/05/2016 1/61/0960  Systolic BP 454 098 119  Diastolic BP 87 72 83  Wt. (Lbs) 189.2 182.6 186  BMI 30.54 29.47 30.02   Physical Exam  Constitutional: He is oriented to person, place, and time. He appears well-developed and well-nourished. No distress.  HENT:  Head: Normocephalic and atraumatic.  Eyes: Conjunctivae and EOM are normal.  Neck: Normal range of motion and full passive range of motion without pain. Neck supple. No JVD present. Spinous process tenderness present. Carotid bruit is not present. No neck rigidity. No edema and normal range of motion present.  Cardiovascular: Normal heart sounds. Exam reveals no gallop and no friction rub.  No murmur heard. Pulmonary/Chest: Effort normal and breath sounds normal. No respiratory distress. He exhibits no tenderness.  Abdominal: Soft. Bowel sounds are normal. He exhibits no distension. There is no tenderness.  Musculoskeletal: Normal range of motion. He exhibits tenderness. He exhibits no edema or deformity.       Cervical back: He exhibits tenderness and bony tenderness. He exhibits normal range of motion and no deformity.       Thoracic back: He exhibits tenderness and bony tenderness. He exhibits normal range of motion and no deformity.       Lumbar back: He exhibits tenderness and bony tenderness. He exhibits normal range of motion and no deformity.  Neurological: He is alert and oriented to person, place, and time. He has normal strength.  Skin: Skin is warm, dry and intact. Rash (localized at the right lower calf) noted.  Psychiatric: He has a normal mood and affect. His speech is normal and behavior is normal. Judgment and thought content normal. Cognition and memory are normal.  Nursing note and vitals  reviewed.  Assessment & Plan:   1. Essential hypertension, controlled - Patient well controlled with medication and diet regimen.  - F/u in 3 months.  - amLODipine (NORVASC) 5 MG tablet; Take 1 tablet (5 mg total) by mouth daily.  Dispense: 30 tablet; Refill: 0 - Ambulatory referral to Ophthalmology  2. Gastroesophageal reflux disease without esophagitis, controlled  - Patient controlled on medication regimen and reduced spicy foods.  - F/u in 3 months.  - omeprazole (PRILOSEC) 40 MG capsule; Take 1 capsule (40 mg total) by mouth daily.  Dispense: 30 capsule; Refill: 0  3. Healthcare maintenance - Patient decline flu vaccination.  - CBC with Differential - Comprehensive metabolic panel - TSH - Lipid Panel - HIV antibody (with reflex) - Ambulatory referral to Gastroenterology - Ambulatory referral to Ophthalmology - Hemoglobin A1c  4. Bony sclerosis, uncontrolled - Patient continues to have chronic back pain. Denies neurological symptoms at this time. Labs ordered to evaluate bone health and metastastic disease.  - Encouraged to keep PET scan appointment.  - F/u in 3 months.  - Vitamin D, 25-hydroxy - Magnesium - Phosphorus - PTH, Intact and Calcium - acetaminophen-codeine (TYLENOL #3) 300-30 MG tablet; Take  1 tablet by mouth every 6 (six) hours as needed for moderate pain.  Dispense: 60 tablet; Refill: 0  5. Eczema, unspecified type - Patient presented with a localized eczema rash to R leg. Prescribed topical steroid.  - F/u as needed.  - triamcinolone ointment (KENALOG) 0.5 %; Apply 1 application topically 2 (two) times daily.  Dispense: 30 g; Refill: 0  6. Osteolysis - NM PET Image Initial (PI) Skull Base To Thigh; Future  Meds ordered this encounter  Medications  . amLODipine (NORVASC) 5 MG tablet    Sig: Take 1 tablet (5 mg total) by mouth daily.    Dispense:  30 tablet    Refill:  0    Must have office visit for refills  . omeprazole (PRILOSEC) 40 MG capsule     Sig: Take 1 capsule (40 mg total) by mouth daily.    Dispense:  30 capsule    Refill:  0    Must have office visit for refills  . acetaminophen-codeine (TYLENOL #3) 300-30 MG tablet    Sig: Take 1 tablet by mouth every 6 (six) hours as needed for moderate pain.    Dispense:  60 tablet    Refill:  0  . triamcinolone ointment (KENALOG) 0.5 %    Sig: Apply 1 application topically 2 (two) times daily.    Dispense:  30 g    Refill:  0    Follow-up: Return in about 3 months (around 07/05/2017).   Krystle H. Hulan Fray, DNP-student  Evaluation and management procedures were performed by me with DNP Student in attendance, note written by DNP student under my supervision and collaboration. I have reviewed the note and I agree with the management and plan.   Angelica Chessman, MD, [place], [place], [place] [facility] and [facility] [place], [place]   04/07/2017, 6:25 PM

## 2017-04-07 NOTE — Patient Instructions (Addendum)
Please call to schedule your PET scan, as discussed to evaluate your bone sclerosis and spinal pain.    Hypertension Hypertension is another name for high blood pressure. High blood pressure forces your heart to work harder to pump blood. This can cause problems over time. There are two numbers in a blood pressure reading. There is a top number (systolic) over a bottom number (diastolic). It is best to have a blood pressure below 120/80. Healthy choices can help lower your blood pressure. You may need medicine to help lower your blood pressure if:  Your blood pressure cannot be lowered with healthy choices.  Your blood pressure is higher than 130/80.  Follow these instructions at home: Eating and drinking  If directed, follow the DASH eating plan. This diet includes: ? Filling half of your plate at each meal with fruits and vegetables. ? Filling one quarter of your plate at each meal with whole grains. Whole grains include whole wheat pasta, brown rice, and whole grain bread. ? Eating or drinking low-fat dairy products, such as skim milk or low-fat yogurt. ? Filling one quarter of your plate at each meal with low-fat (lean) proteins. Low-fat proteins include fish, skinless chicken, eggs, beans, and tofu. ? Avoiding fatty meat, cured and processed meat, or chicken with skin. ? Avoiding premade or processed food.  Eat less than 1,500 mg of salt (sodium) a day.  Limit alcohol use to no more than 1 drink a day for nonpregnant women and 2 drinks a day for men. One drink equals 12 oz of beer, 5 oz of wine, or 1 oz of hard liquor. Lifestyle  Work with your doctor to stay at a healthy weight or to lose weight. Ask your doctor what the best weight is for you.  Get at least 30 minutes of exercise that causes your heart to beat faster (aerobic exercise) most days of the week. This may include walking, swimming, or biking.  Get at least 30 minutes of exercise that strengthens your muscles  (resistance exercise) at least 3 days a week. This may include lifting weights or pilates.  Do not use any products that contain nicotine or tobacco. This includes cigarettes and e-cigarettes. If you need help quitting, ask your doctor.  Check your blood pressure at home as told by your doctor.  Keep all follow-up visits as told by your doctor. This is important. Medicines  Take over-the-counter and prescription medicines only as told by your doctor. Follow directions carefully.  Do not skip doses of blood pressure medicine. The medicine does not work as well if you skip doses. Skipping doses also puts you at risk for problems.  Ask your doctor about side effects or reactions to medicines that you should watch for. Contact a doctor if:  You think you are having a reaction to the medicine you are taking.  You have headaches that keep coming back (recurring).  You feel dizzy.  You have swelling in your ankles.  You have trouble with your vision. Get help right away if:  You get a very bad headache.  You start to feel confused.  You feel weak or numb.  You feel faint.  You get very bad pain in your: ? Chest. ? Belly (abdomen).  You throw up (vomit) more than once.  You have trouble breathing. Summary  Hypertension is another name for high blood pressure.  Making healthy choices can help lower blood pressure. If your blood pressure cannot be controlled with healthy choices, you  may need to take medicine. This information is not intended to replace advice given to you by your health care provider. Make sure you discuss any questions you have with your health care provider. Document Released: 07/15/2007 Document Revised: 12/25/2015 Document Reviewed: 12/25/2015 Elsevier Interactive Patient Education  [employer].

## 2017-04-08 ENCOUNTER — Other Ambulatory Visit: Payer: Self-pay | Admitting: Internal Medicine

## 2017-04-08 ENCOUNTER — Ambulatory Visit: Payer: Self-pay | Attending: Internal Medicine

## 2017-04-08 DIAGNOSIS — Q782 Osteopetrosis: Secondary | ICD-10-CM

## 2017-04-08 DIAGNOSIS — Z0000 Encounter for general adult medical examination without abnormal findings: Secondary | ICD-10-CM | POA: Insufficient documentation

## 2017-04-08 MED ORDER — ACETAMINOPHEN-CODEINE #3 300-30 MG PO TABS: 1.0000 | ORAL_TABLET | Freq: Four times a day (QID) | ORAL | 0 refills | Status: DC | PRN

## 2017-04-08 NOTE — Progress Notes (Signed)
Patient here for lab visit only 

## 2017-04-09 LAB — COMPREHENSIVE METABOLIC PANEL
ALK PHOS: 162 IU/L — AB (ref 39–117)
ALT: 20 IU/L (ref 0–44)
AST: 19 IU/L (ref 0–40)
Albumin/Globulin Ratio: 1.4 (ref 1.2–2.2)
Albumin: 4.3 g/dL (ref 3.6–4.8)
BUN/Creatinine Ratio: 13 (ref 10–24)
BUN: 12 mg/dL (ref 8–27)
Bilirubin Total: 1.4 mg/dL — ABNORMAL HIGH (ref 0.0–1.2)
CO2: 23 mmol/L (ref 20–29)
CREATININE: 0.91 mg/dL (ref 0.76–1.27)
Calcium: 9.2 mg/dL (ref 8.6–10.2)
Chloride: 103 mmol/L (ref 96–106)
GFR calc non Af Amer: 91 mL/min/{1.73_m2} (ref >59)
GFR, EST AFRICAN AMERICAN: 105 mL/min/{1.73_m2} (ref >59)
GLUCOSE: 77 mg/dL (ref 65–99)
Globulin, Total: 3 g/dL (ref 1.5–4.5)
Potassium: 4 mmol/L (ref 3.5–5.2)
Sodium: 142 mmol/L (ref 134–144)
TOTAL PROTEIN: 7.3 g/dL (ref 6.0–8.5)

## 2017-04-09 LAB — CBC WITH DIFFERENTIAL/PLATELET
Basophils Absolute: 0 10*3/uL (ref 0.0–0.2)
Basos: 0 %
EOS (ABSOLUTE): 0.1 10*3/uL (ref 0.0–0.4)
Eos: 1 %
Hematocrit: 51.9 % — ABNORMAL HIGH (ref 37.5–51.0)
Hemoglobin: 18 g/dL — ABNORMAL HIGH (ref 13.0–17.7)
IMMATURE GRANS (ABS): 0 10*3/uL (ref 0.0–0.1)
Immature Granulocytes: 0 %
LYMPHS: 37 %
Lymphocytes Absolute: 2.4 10*3/uL (ref 0.7–3.1)
MCH: 28.5 pg (ref 26.6–33.0)
MCHC: 34.7 g/dL (ref 31.5–35.7)
MCV: 82 fL (ref 79–97)
MONOCYTES: 8 %
Monocytes Absolute: 0.5 10*3/uL (ref 0.1–0.9)
NEUTROS PCT: 54 %
Neutrophils Absolute: 3.5 10*3/uL (ref 1.4–7.0)
Platelets: 234 10*3/uL (ref 150–379)
RBC: 6.32 x10E6/uL — AB (ref 4.14–5.80)
RDW: 15.1 % (ref 12.3–15.4)
WBC: 6.5 10*3/uL (ref 3.4–10.8)

## 2017-04-09 LAB — HIV ANTIBODY (ROUTINE TESTING W REFLEX): HIV SCREEN 4TH GENERATION: NONREACTIVE

## 2017-04-09 LAB — LIPID PANEL
Chol/HDL Ratio: 5.3 ratio — ABNORMAL HIGH (ref 0.0–5.0)
Cholesterol, Total: 171 mg/dL (ref 100–199)
HDL: 32 mg/dL — AB (ref >39)
LDL Calculated: 121 mg/dL — ABNORMAL HIGH (ref 0–99)
TRIGLYCERIDES: 91 mg/dL (ref 0–149)
VLDL CHOLESTEROL CAL: 18 mg/dL (ref 5–40)

## 2017-04-09 LAB — PTH, INTACT AND CALCIUM: PTH: 53 pg/mL (ref 15–65)

## 2017-04-09 LAB — VITAMIN D 25 HYDROXY (VIT D DEFICIENCY, FRACTURES): Vit D, 25-Hydroxy: 9.7 ng/mL — ABNORMAL LOW (ref 30.0–100.0)

## 2017-04-09 LAB — HEMOGLOBIN A1C
ESTIMATED AVERAGE GLUCOSE: 103 mg/dL
Hgb A1c MFr Bld: 5.2 % (ref 4.8–5.6)

## 2017-04-09 LAB — MAGNESIUM: Magnesium: 2.1 mg/dL (ref 1.6–2.3)

## 2017-04-09 LAB — PHOSPHORUS: PHOSPHORUS: 3.1 mg/dL (ref 2.5–4.5)

## 2017-04-09 LAB — TSH: TSH: 1.81 u[IU]/mL (ref 0.450–4.500)

## 2017-04-13 ENCOUNTER — Telehealth: Payer: Self-pay | Admitting: *Deleted

## 2017-04-13 ENCOUNTER — Encounter: Payer: Self-pay | Admitting: Gastroenterology

## 2017-04-13 ENCOUNTER — Ambulatory Visit: Payer: Self-pay | Attending: Family Medicine

## 2017-04-13 NOTE — Telephone Encounter (Signed)
Pt request results from lab test.  Please advise.

## 2017-04-13 NOTE — Telephone Encounter (Signed)
Pt aware of appointment for PET Monday, MArch 11th, 2019 @ 0800. [facility] Aware to arrive at 0730, NPO p MN Pt verbalized understanding.

## 2017-04-19 ENCOUNTER — Ambulatory Visit (HOSPITAL_COMMUNITY)
Admission: RE | Admit: 2017-04-19 | Discharge: 2017-04-19 | Disposition: A | Discharge status: Home / Self Care | Payer: Self-pay | Source: Ambulatory Visit | Attending: Internal Medicine | Admitting: Internal Medicine

## 2017-04-19 DIAGNOSIS — M8950 Osteolysis, unspecified site: Secondary | ICD-10-CM | POA: Insufficient documentation

## 2017-04-19 DIAGNOSIS — R911 Solitary pulmonary nodule: Secondary | ICD-10-CM | POA: Insufficient documentation

## 2017-04-19 DIAGNOSIS — M899 Disorder of bone, unspecified: Secondary | ICD-10-CM | POA: Insufficient documentation

## 2017-04-19 LAB — GLUCOSE, CAPILLARY: Glucose-Capillary: 94 mg/dL (ref 65–99)

## 2017-04-19 MED ORDER — FLUDEOXYGLUCOSE F - 18 (FDG) INJECTION
9.0000 | Freq: Once | INTRAVENOUS | Status: AC | PRN
  Administered 2017-04-19: 9 via INTRAVENOUS

## 2017-04-21 ENCOUNTER — Ambulatory Visit: Payer: Self-pay | Attending: Family Medicine | Admitting: Family Medicine

## 2017-04-21 ENCOUNTER — Encounter: Payer: Self-pay | Admitting: Family Medicine

## 2017-04-21 VITALS — BP 133/82 | HR 79 | Temp 97.6°F | Ht 66.0 in | Wt 187.4 lb

## 2017-04-21 DIAGNOSIS — M5135 Other intervertebral disc degeneration, thoracolumbar region: Secondary | ICD-10-CM | POA: Insufficient documentation

## 2017-04-21 DIAGNOSIS — D751 Secondary polycythemia: Secondary | ICD-10-CM | POA: Insufficient documentation

## 2017-04-21 DIAGNOSIS — Q782 Osteopetrosis: Secondary | ICD-10-CM

## 2017-04-21 DIAGNOSIS — E559 Vitamin D deficiency, unspecified: Secondary | ICD-10-CM | POA: Insufficient documentation

## 2017-04-21 DIAGNOSIS — Z79899 Other long term (current) drug therapy: Secondary | ICD-10-CM | POA: Insufficient documentation

## 2017-04-21 DIAGNOSIS — Z7689 Persons encountering health services in other specified circumstances: Secondary | ICD-10-CM | POA: Insufficient documentation

## 2017-04-21 DIAGNOSIS — M479 Spondylosis, unspecified: Secondary | ICD-10-CM | POA: Insufficient documentation

## 2017-04-21 DIAGNOSIS — R748 Abnormal levels of other serum enzymes: Secondary | ICD-10-CM | POA: Insufficient documentation

## 2017-04-21 DIAGNOSIS — I10 Essential (primary) hypertension: Secondary | ICD-10-CM | POA: Insufficient documentation

## 2017-04-21 DIAGNOSIS — K219 Gastro-esophageal reflux disease without esophagitis: Secondary | ICD-10-CM | POA: Insufficient documentation

## 2017-04-21 MED ORDER — MELOXICAM 7.5 MG PO TABS: 7.5000 mg | ORAL_TABLET | Freq: Every day | ORAL | 1 refills | Status: DC

## 2017-04-21 MED ORDER — ERGOCALCIFEROL 1.25 MG (50000 UT) PO CAPS: 50000.0000 [IU] | ORAL_CAPSULE | ORAL | 1 refills | Status: DC

## 2017-04-21 MED ORDER — METHOCARBAMOL 750 MG PO TABS: 750.0000 mg | ORAL_TABLET | Freq: Two times a day (BID) | ORAL | 1 refills | Status: DC | PRN

## 2017-04-21 MED FILL — MELOXICAM 7.5 MG TABLET: 7.5 | 30 days supply | Qty: 30 | Fill #0

## 2017-04-21 MED FILL — VIT D2 1.25 MG (50,000 UNIT: 1.25 MG | 28 days supply | Qty: 4 | Fill #0

## 2017-04-21 MED FILL — METHOCARBAMOL 750 MG TABS: 750 | 30 days supply | Qty: 60 | Fill #0

## 2017-04-21 NOTE — Progress Notes (Signed)
Subjective:  Patient ID: Aaron Dennis, male    DOB: January 25, 1956  Age: 62 y.o. MRN: [id number]  CC: Establish Care   HPI Aaron Dennis is a 62 year old male with a history of hypertension who presents to establish care with me as he was previously followed by Dr. Doreene Burke. He has a history of chronic thoracolumbar pain which he describes as a 7/10 for the last 10 years worsened by driving and relieved by resting or lying supine but he denies taking analgesics.  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  Denies the presence of other forms of bone pain including thigh pain and upper extremity pain. Workup by previous PCP revealed mildly elevated alkaline phosphatase, vitamin D deficiency spine x-ray from 12/2015 revealed mild endplate spondylosis at multiple levels and most recently PET scan was negative for hypermetabolic bone lesions but revealed chronic mixed and lytic sclerotic changes throughout the skeleton which could represent myelofibrosis or metabolic bone disorder. Of note he does have a history of polycythemia and has had phlebotomy in the past to treat this.  Past Medical History:  Diagnosis Date  . GERD (gastroesophageal reflux disease)   . Hypertension   . Lung nodule    7 mm LUL nodule    No past surgical history on file.  No Known Allergies   Outpatient Medications Prior to Visit  Medication Sig Dispense Refill  . amLODipine (NORVASC) 5 MG tablet Take 1 tablet (5 mg total) by mouth daily. 30 tablet 0  . omeprazole (PRILOSEC) 40 MG capsule Take 1 capsule (40 mg total) by mouth daily. 30 capsule 0  . acetaminophen-codeine (TYLENOL #3) 300-30 MG tablet Take 1 tablet by mouth every 6 (six) hours as needed for moderate pain. (Patient not taking: Reported on 04/21/2017) 60 tablet 0  . triamcinolone ointment (KENALOG) 0.5 % Apply 1 application topically 2 (two) times daily. (Patient not taking: Reported on 04/21/2017) 30 g 0   No facility-administered  medications prior to visit.     ROS Review of Systems  Constitutional: Negative for activity change and appetite change.  HENT: Negative for sinus pressure and sore throat.   Eyes: Negative for visual disturbance.  Respiratory: Negative for cough, chest tightness and shortness of breath.   Cardiovascular: Negative for chest pain and leg swelling.  Gastrointestinal: Negative for abdominal distention, abdominal pain, constipation and diarrhea.  Endocrine: Negative.   Genitourinary: Negative for dysuria.  Musculoskeletal:       See hpi  Skin: Negative for rash.  Allergic/Immunologic: Negative.   Neurological: Negative for weakness, light-headedness and numbness.  Psychiatric/Behavioral: Negative for dysphoric mood and suicidal ideas.    Objective:  BP 133/82   Pulse 79   Temp 97.6 F (36.4 C) (Oral)   Ht 5' 6"  (1.676 m)   Wt 187 lb 6.4 oz (85 kg)   SpO2 99%   BMI 30.25 kg/m   BP/Weight 04/21/2017 04/07/2017 2/45/8099  Systolic BP 833 825 053  Diastolic BP 82 87 72  Wt. (Lbs) 187.4 189.2 182.6  BMI 30.25 30.54 29.47      Physical Exam  Constitutional: He is oriented to person, place, and time. He appears well-developed and well-nourished.  Cardiovascular: Normal rate, normal heart sounds and intact distal pulses.  No murmur heard. Pulmonary/Chest: Effort normal and breath sounds normal. He has no wheezes. He has no rales. He exhibits no tenderness.  Abdominal: Soft. Bowel sounds are normal. He exhibits no distension and no  mass. There is no tenderness.  Musculoskeletal: Normal range of motion.  Neurological: He is alert and oriented to person, place, and time.  Skin: Skin is warm and dry.  Psychiatric: He has a normal mood and affect.     Assessment & Plan:   1. Vitamin D deficiency Placed on Drisdol  2. Essential hypertension Controlled Continue antihypertensives, low-sodium and DASH diet  3. 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 Bony sclerosis Abnormal finding on PET scan -suspicious for myelofibrosis. Might need bone marrow biopsy - Ambulatory referral to Hematology / Oncology  5. Elevated alkaline phosphatase level slight elevation could be due to chronic back pain - Lactate Dehydrogenase, Isoenzymes - PSA, total and free   Meds ordered this encounter  Medications  . ergocalciferol (DRISDOL) 50000 units capsule    Sig: Take 1 capsule (50,000 Units total) by mouth once a week.    Dispense:  9 capsule    Refill:  1  . methocarbamol (ROBAXIN-750) 750 MG tablet    Sig: Take 1 tablet (750 mg total) by mouth 2 (two) times daily as needed for muscle spasms.    Dispense:  60 tablet    Refill:  1  . meloxicam (MOBIC) 7.5 MG tablet    Sig: Take 1 tablet (7.5 mg total) by mouth daily.    Dispense:  30 tablet    Refill:  1    Follow-up: Return in about 3 months (around 07/22/2017) for Top of chronic medical conditions.   Charlott Rakes MD

## 2017-04-21 NOTE — Patient Instructions (Signed)

## 2017-04-22 ENCOUNTER — Telehealth: Payer: Self-pay | Admitting: Gastroenterology

## 2017-04-22 ENCOUNTER — Other Ambulatory Visit: Payer: Self-pay

## 2017-04-22 ENCOUNTER — Encounter: Payer: Self-pay | Admitting: Family Medicine

## 2017-04-22 ENCOUNTER — Ambulatory Visit (AMBULATORY_SURGERY_CENTER): Payer: Self-pay

## 2017-04-22 VITALS — Ht 66.0 in | Wt 188.0 lb

## 2017-04-22 DIAGNOSIS — Z1211 Encounter for screening for malignant neoplasm of colon: Secondary | ICD-10-CM

## 2017-04-22 MED ORDER — SOD PICOSULFATE-MAG OX-CIT ACD 10-3.5-12 MG-GM -GM/160ML PO SOLN: 1.0000 | Freq: Once | ORAL | 0 refills | Status: AC

## 2017-04-22 NOTE — Telephone Encounter (Signed)
Called pt and left message we would leave a sample of Clenpiq (lot [id number], EXP 09/2017) at the front desk on the 4th floor for him to pick up.  Pt to call if he has further questions. Gwyndolyn Saxon

## 2017-04-22 NOTE — Telephone Encounter (Signed)
Pharmacy called to state they do not carry the prep sent in for pts colon on 3.28.19 with Dr.Gupta. Due to pt not having ins they suggest suprep or golitely which they do have. Pt had pv today 3.14.19.

## 2017-04-22 NOTE — Progress Notes (Signed)
No egg or soy allergy known to patient  No issues with past sedation with any surgeries  or procedures, no intubation problems  No diet pills per patient No home 02 use per patient  No blood thinners per patient  Pt denies issues with constipation  No A fib or A flutter  EMMI video sent to pt's e mail  

## 2017-04-23 ENCOUNTER — Telehealth: Payer: Self-pay | Admitting: Internal Medicine

## 2017-04-23 NOTE — Telephone Encounter (Signed)
Appt has been scheduled for the pt to see Dr. Julien Nordmann on 3/20 at 1130am. Pt aware to arrive 30 minutes early. Pt agreed to the appt date and time.

## 2017-04-24 LAB — LACTATE DEHYDROGENASE, ISOENZYMES
(LD) FRACTION 3: 22 % (ref 17–27)
(LD) FRACTION 4: 7 % (ref 5–13)
(LD) FRACTION 5: 10 % (ref 4–20)
(LD) Fraction 1: 27 % (ref 17–32)
(LD) Fraction 2: 34 % (ref 25–40)
LDH: 199 IU/L (ref 121–224)

## 2017-04-24 LAB — PSA, TOTAL AND FREE
PROSTATE SPECIFIC AG, SERUM: 1.5 ng/mL (ref 0.0–4.0)
PSA, Free Pct: 21.3 %
PSA, Free: 0.32 ng/mL

## 2017-04-26 ENCOUNTER — Telehealth: Payer: Self-pay | Admitting: Gastroenterology

## 2017-04-26 NOTE — Telephone Encounter (Signed)
Notified pharmacy to d/c clenpiq. We will give patient a sample.

## 2017-04-28 ENCOUNTER — Telehealth: Payer: Self-pay | Admitting: Adult Health

## 2017-04-28 ENCOUNTER — Telehealth: Payer: Self-pay | Admitting: Internal Medicine

## 2017-04-28 ENCOUNTER — Encounter: Payer: Self-pay | Admitting: Internal Medicine

## 2017-04-28 ENCOUNTER — Inpatient Hospital Stay: Payer: Self-pay | Attending: Internal Medicine | Admitting: Internal Medicine

## 2017-04-28 ENCOUNTER — Inpatient Hospital Stay: Payer: Self-pay

## 2017-04-28 VITALS — BP 159/89 | HR 81 | Temp 97.6°F | Resp 20 | Ht 66.0 in | Wt 187.5 lb

## 2017-04-28 DIAGNOSIS — M899 Disorder of bone, unspecified: Secondary | ICD-10-CM | POA: Insufficient documentation

## 2017-04-28 DIAGNOSIS — Z87891 Personal history of nicotine dependence: Secondary | ICD-10-CM

## 2017-04-28 DIAGNOSIS — D45 Polycythemia vera: Secondary | ICD-10-CM

## 2017-04-28 DIAGNOSIS — I10 Essential (primary) hypertension: Secondary | ICD-10-CM

## 2017-04-28 DIAGNOSIS — R911 Solitary pulmonary nodule: Secondary | ICD-10-CM

## 2017-04-28 DIAGNOSIS — Q782 Osteopetrosis: Secondary | ICD-10-CM

## 2017-04-28 DIAGNOSIS — D582 Other hemoglobinopathies: Secondary | ICD-10-CM | POA: Insufficient documentation

## 2017-04-28 DIAGNOSIS — D751 Secondary polycythemia: Secondary | ICD-10-CM | POA: Insufficient documentation

## 2017-04-28 DIAGNOSIS — M549 Dorsalgia, unspecified: Secondary | ICD-10-CM

## 2017-04-28 LAB — CBC WITH DIFFERENTIAL (CANCER CENTER ONLY)
BASOS ABS: 0.1 10*3/uL (ref 0.0–0.1)
BASOS PCT: 1 %
EOS ABS: 0.1 10*3/uL (ref 0.0–0.5)
EOS PCT: 1 %
HCT: 53.6 % — ABNORMAL HIGH (ref 38.4–49.9)
Hemoglobin: 17.7 g/dL — ABNORMAL HIGH (ref 13.0–17.1)
Lymphocytes Relative: 29 %
Lymphs Abs: 2 10*3/uL (ref 0.9–3.3)
MCH: 27.4 pg (ref 27.2–33.4)
MCHC: 33 g/dL (ref 32.0–36.0)
MCV: 83.1 fL (ref 79.3–98.0)
MONO ABS: 0.5 10*3/uL (ref 0.1–0.9)
Monocytes Relative: 8 %
Neutro Abs: 4.2 10*3/uL (ref 1.5–6.5)
Neutrophils Relative %: 61 %
PLATELETS: 222 10*3/uL (ref 140–400)
RBC: 6.45 MIL/uL — ABNORMAL HIGH (ref 4.20–5.82)
RDW: 14.6 % (ref 11.0–14.6)
WBC Count: 6.9 10*3/uL (ref 4.0–10.3)

## 2017-04-28 LAB — CMP (CANCER CENTER ONLY)
ALT: 22 U/L (ref 0–55)
AST: 20 U/L (ref 5–34)
Albumin: 3.8 g/dL (ref 3.5–5.0)
Alkaline Phosphatase: 155 U/L — ABNORMAL HIGH (ref 40–150)
Anion gap: 9 (ref 3–11)
BILIRUBIN TOTAL: 1.4 mg/dL — AB (ref 0.2–1.2)
BUN: 12 mg/dL (ref 7–26)
CALCIUM: 9.5 mg/dL (ref 8.4–10.4)
CO2: 25 mmol/L (ref 22–29)
CREATININE: 0.92 mg/dL (ref 0.70–1.30)
Chloride: 107 mmol/L (ref 98–109)
Glucose, Bld: 127 mg/dL (ref 70–140)
Potassium: 3.4 mmol/L — ABNORMAL LOW (ref 3.5–5.1)
Sodium: 141 mmol/L (ref 136–145)
TOTAL PROTEIN: 7.5 g/dL (ref 6.4–8.3)

## 2017-04-28 LAB — LACTATE DEHYDROGENASE: LDH: 171 U/L (ref 125–245)

## 2017-04-28 NOTE — Telephone Encounter (Signed)
Called and spoke with patient about bone marrow biopsy request from Dr. Earlie Server.  Patient aware of need of test.  Patient to come in on Friday, 3/22 at 0730 for bone marrow biopsy that will take place at 8 am.  I reviewed the procedure with the patient in detail.  High priority scheduling message sent.  8 am bone marrow appointment with flow scheduled (spoke with Estill Bamberg).   Wilber Bihari, NP

## 2017-04-28 NOTE — Telephone Encounter (Signed)
Patient sent back to lab and given avs report. Los from 3/20 not completed.

## 2017-04-28 NOTE — Progress Notes (Signed)
[facility] [phone]   [phone]  CONSULT NOTE  REFERRING PHYSICIAN: Dr. Eustaquio Boyden  REASON FOR CONSULTATION:  62 years old African male with polycythemia and questionable bone abnormality on PET scan.  HPI Aaron Dennis is a 62 y.o. male with past medical history significant for hypertension, GERD as well as small left lung pulmonary nodule that has been going on for the last 2 years.  Is followed by Dr. Roxan Hockey for this pulmonary nodule.  The patient has been doing fine with no specific complaints except for intermittent back pain.  He was seen recently by his primary care physician Dr. Doreene Burke.  CBC performed at that time on 04/08/2017 showed normal white blood count of 6.5 but there was elevated hemoglobin of 15.0 and hematocrit 51.9%.  The patient had normal platelet count of 234,000.  He also had a PET scan performed on April 19, 2017.  It showed a stable 5 mm left upper lobe pulmonary nodule.  The scan also showed a hypermetabolic nodule within the posterior aspect of the prostate gland abutting the anterior wall of the rectum measuring 1.5 cm with SUV max of 21.5 and a second hypermetabolic nodule within the left side of the gland measuring 1.5 cm with SUV max of 21.5.  The skeletal portion of the exam showed nonspecific mass 18 and a sclerotic appearance of the bones throughout the visualized axial skeleton suspicious for myelofibrosis or metabolic bone disorder.  PSA performed on April 21, 2017 was normal at 1.5.  PTH was also normal at 53.  Vitamin the was low at 9.7. The patient was referred to me today for evaluation and recommendation regarding the abnormality on his PET scan as well as the polycythemia. When seen today he is feeling fine with no specific complaints except for the intermittent back pain.  He denied having any chest pain, shortness breath, cough or hemoptysis.  He denied having any fever or chills.  He has no nausea,  vomiting, diarrhea or constipation.  The patient has no headache or visual changes. Family history significant for mother still alive: Father died from old age and brother has diabetes mellitus.. The patient is married and has 3 children.  He works as a Architect.  He has a history for smoking [place] for around 14 years and quit 2 years ago.  He has no history of alcohol or drug abuse.  HPI  Past Medical History:  Diagnosis Date  . GERD (gastroesophageal reflux disease)   . Hypertension   . Lung nodule    7 mm LUL nodule    No past surgical history on file.  Family History  Problem Relation Age of Onset  . Diabetes Brother   . Colon polyps Neg Hx   . Colon cancer Neg Hx   . Esophageal cancer Neg Hx   . Rectal cancer Neg Hx   . Stomach cancer Neg Hx     Social History Social History   Tobacco Use  . Smoking status: Former Research scientist (life sciences)  . Smokeless tobacco: Never Used  . Tobacco comment: quit Jan 2018  Substance Use Topics  . Alcohol use: No  . Drug use: No    No Known Allergies  Current Outpatient Medications  Medication Sig Dispense Refill  . acetaminophen-codeine (TYLENOL #3) 300-30 MG tablet Take 1 tablet by mouth every 6 (six) hours as needed for moderate pain. (Patient not taking: Reported on 04/21/2017) 60 tablet 0  . amLODipine (NORVASC) 5  MG tablet Take 1 tablet (5 mg total) by mouth daily. 30 tablet 0  . ergocalciferol (DRISDOL) 50000 units capsule Take 1 capsule (50,000 Units total) by mouth once a week. (Patient not taking: Reported on 04/22/2017) 9 capsule 1  . meloxicam (MOBIC) 7.5 MG tablet Take 1 tablet (7.5 mg total) by mouth daily. (Patient not taking: Reported on 04/22/2017) 30 tablet 1  . methocarbamol (ROBAXIN-750) 750 MG tablet Take 1 tablet (750 mg total) by mouth 2 (two) times daily as needed for muscle spasms. (Patient not taking: Reported on 04/22/2017) 60 tablet 1  . omeprazole (PRILOSEC) 40 MG capsule Take 1 capsule (40 mg total) by mouth daily. 30  capsule 0  . triamcinolone ointment (KENALOG) 0.5 % Apply 1 application topically 2 (two) times daily. 30 g 0   No current facility-administered medications for this visit.     Review of Systems  Constitutional: negative Eyes: negative Ears, nose, mouth, throat, and face: negative Respiratory: negative Cardiovascular: negative Gastrointestinal: negative Genitourinary:negative Integument/breast: negative Hematologic/lymphatic: negative Musculoskeletal:positive for back pain Neurological: negative Behavioral/Psych: negative Endocrine: negative Allergic/Immunologic: negative  Physical Exam  IDP:OEUMP, healthy, no distress, well nourished and well developed SKIN: skin color, texture, turgor are normal, no rashes or significant lesions HEAD: Normocephalic, No masses, lesions, tenderness or abnormalities EYES: normal, PERRLA, Conjunctiva are pink and non-injected EARS: External ears normal, Canals clear OROPHARYNX:no exudate, no erythema and lips, buccal mucosa, and tongue normal  NECK: supple, no adenopathy, no JVD LYMPH:  no palpable lymphadenopathy, no hepatosplenomegaly LUNGS: clear to auscultation , and palpation HEART: regular rate & rhythm, no murmurs and no gallops ABDOMEN:abdomen soft, non-tender, normal bowel sounds and no masses or organomegaly BACK: No CVA tenderness, Range of motion is normal EXTREMITIES:no joint deformities, effusion, or inflammation, no edema, no skin discoloration  NEURO: alert & oriented x 3 with fluent speech, no focal motor/sensory deficits  PERFORMANCE STATUS: ECOG 0  LABORATORY DATA: Lab Results  Component Value Date   WBC 6.9 04/28/2017   HGB 18.0 (H) 04/08/2017   HCT 53.6 (H) 04/28/2017   MCV 83.1 04/28/2017   PLT 222 04/28/2017      Chemistry      Component Value Date/Time   NA 142 04/08/2017 0942   K 4.0 04/08/2017 0942   CL 103 04/08/2017 0942   CO2 23 04/08/2017 0942   BUN 12 04/08/2017 0942   CREATININE 0.91 04/08/2017  0942   CREATININE 1.02 12/05/2015 1109      Component Value Date/Time   CALCIUM 9.2 04/08/2017 0942   ALKPHOS 162 (H) 04/08/2017 0942   AST 19 04/08/2017 0942   ALT 20 04/08/2017 0942   BILITOT 1.4 (H) 04/08/2017 0942       RADIOGRAPHIC STUDIES: Nm Pet Image Initial (pi) Skull Base To Thigh  Result Date: 04/19/2017 CLINICAL DATA:  Initial treatment strategy for lung nodule. EXAM: NUCLEAR MEDICINE PET SKULL BASE TO THIGH TECHNIQUE: 9.0 mCi F-18 FDG was injected intravenously. Full-ring PET imaging was performed from the skull base to thigh after the radiotracer. CT data was obtained and used for attenuation correction and anatomic localization. Fasting blood glucose: 94 mg/dl Mediastinal blood pool activity: SUV max 2.54 COMPARISON:  CT chest 06/23/2016 FINDINGS: NECK: No hypermetabolic lymph nodes in the neck. Incidental CT findings: none CHEST: No hypermetabolic mediastinal or hilar nodes. No suspicious pulmonary nodules on the CT scan. Incidental CT findings: Left upper lobe pulmonary nodule measures 5 mm and is not significantly changed from previous exam. This is too small  to reliably characterize by PET-CT. No additional pulmonary nodules identified. ABDOMEN/PELVIS: No abnormal hypermetabolic activity within the liver, pancreas, adrenal glands, or spleen. No hypermetabolic lymph nodes in the abdomen or pelvis. There is a hypermetabolic nodule within the posterior aspect of the prostate gland abutting the anterior wall of rectum. Measuring approximately 1.5 cm with an SUV max of 21.5. A second hypermetabolic nodule is noted within the left side of gland measuring 1.5 cm within SUV max of 21.5. Incidental CT findings: none SKELETON: On the PET images there is no focal area of increased uptake beyond background heterogeneous bone activity. Incidental CT findings: Again noted is a nonspecific moth eaten and sclerotic appearance of the bones throughout the visualized axial skeleton. No discrete  lesions visualized. IMPRESSION: 1. No focal areas of increased uptake identified above background bone activity to suggest a focally hypermetabolic bone lesion. 2. Similar appearance of chronic mixed and lytic sclerotic changes throughout the skeleton which could represent myelofibrosis or metabolic bone disorder. 3. Stable left upper lobe pulmonary nodule measuring 5 mm. Too small to characterize by PET-CT. Electronically Signed   By: Kerby Moors M.D.   On: 04/19/2017 10:31    ASSESSMENT: This is a very pleasant 62 years old [place] male with polycythemia as well as suspicious sclerotic lesions throughout the skeleton concerning for myeloproliferative disorder.   PLAN: I had a lengthy discussion with the patient today about his condition and further investigation to confirm a diagnosis. I recommended for the patient to have repeat CBC, comprehensive metabolic panel, LDH as well as Jak 2 mutation panel. I also discussed with the patient proceeding with a bone marrow biopsy and aspirate to rule out any bone marrow abnormality. I will arrange for the patient to come back for follow-up visit in less than 2 weeks for reevaluation and discussion of his treatment options based on the blood work and biopsy results. He was advised to call immediately if he has any concerning symptoms in the interval. The patient voices understanding of current disease status and treatment options and is in agreement with the current care plan. All questions were answered. The patient knows to call the clinic with any problems, questions or concerns. We can certainly see the patient much sooner if necessary.  Thank you so much for allowing me to participate in the care of [facility]. I will continue to follow up the patient with you and assist in his care.  I spent 40 minutes counseling the patient face to face. The total time spent in the appointment was 60 minutes.  Disclaimer: This note was dictated with voice  recognition software. Similar sounding words can inadvertently be transcribed and may not be corrected upon review.   Eilleen Kempf April 28, 2017, 11:56 AM

## 2017-04-29 ENCOUNTER — Telehealth: Payer: Self-pay

## 2017-04-29 NOTE — Telephone Encounter (Signed)
CMA call patient regarding lab results   Patient did not answer but left VM stating the reason of the call & to call back

## 2017-04-29 NOTE — Telephone Encounter (Signed)
-----   Message from Aaron Dennis, [place] sent at 04/29/2017  9:19 AM EDT ----- Please inform patient of results

## 2017-04-29 NOTE — Telephone Encounter (Signed)
Patient was called and informed of lab results. 

## 2017-04-30 ENCOUNTER — Inpatient Hospital Stay (HOSPITAL_BASED_OUTPATIENT_CLINIC_OR_DEPARTMENT_OTHER): Payer: Self-pay | Admitting: Hematology and Oncology

## 2017-04-30 ENCOUNTER — Other Ambulatory Visit: Payer: Self-pay | Admitting: *Deleted

## 2017-04-30 ENCOUNTER — Inpatient Hospital Stay: Payer: Self-pay

## 2017-04-30 VITALS — BP 132/91 | HR 80 | Temp 97.5°F | Resp 16

## 2017-04-30 DIAGNOSIS — D45 Polycythemia vera: Secondary | ICD-10-CM

## 2017-04-30 DIAGNOSIS — D582 Other hemoglobinopathies: Secondary | ICD-10-CM

## 2017-04-30 LAB — CBC WITH DIFFERENTIAL/PLATELET
Basophils Absolute: 0 10*3/uL (ref 0.0–0.1)
Basophils Relative: 1 %
EOS ABS: 0.1 10*3/uL (ref 0.0–0.5)
EOS PCT: 2 %
HCT: 51.7 % — ABNORMAL HIGH (ref 38.4–49.9)
Hemoglobin: 17.6 g/dL — ABNORMAL HIGH (ref 13.0–17.1)
Lymphocytes Relative: 29 %
Lymphs Abs: 1.9 10*3/uL (ref 0.9–3.3)
MCH: 28.4 pg (ref 27.2–33.4)
MCHC: 34 g/dL (ref 32.0–36.0)
MCV: 83.5 fL (ref 79.3–98.0)
MONO ABS: 0.5 10*3/uL (ref 0.1–0.9)
MONOS PCT: 8 %
Neutro Abs: 4 10*3/uL (ref 1.5–6.5)
Neutrophils Relative %: 60 %
PLATELETS: 198 10*3/uL (ref 140–400)
RBC: 6.19 MIL/uL — ABNORMAL HIGH (ref 4.20–5.82)
RDW: 14.5 % (ref 11.0–14.6)
WBC: 6.6 10*3/uL (ref 4.0–10.3)

## 2017-04-30 NOTE — Patient Instructions (Addendum)
      Bone Marrow Aspiration and Bone Marrow Biopsy, Adult, Care After This sheet gives you information about how to care for yourself after your procedure. Your health care provider may also give you more specific instructions. If you have problems or questions, contact your health care provider. What can I expect after the procedure? After the procedure, it is common to have:  Mild pain and tenderness.  Swelling.  Bruising.  Follow these instructions at home:  Take over-the-counter or prescription medicines only as told by your health care provider.  Do not take baths, swim, or use a hot tub until your health care provider approves. Ask if you can take a shower or have a sponge bath.  Follow instructions from your health care provider about how to take care of the puncture site. Make sure you: ? Wash your hands with soap and water before you change your bandage (dressing). If soap and water are not available, use hand sanitizer. ? Change your dressing as told by your health care provider.  Check your puncture siteevery day for signs of infection. Check for: ? More redness, swelling, or pain. ? More fluid or blood. ? Warmth. ? Pus or a bad smell.  Return to your normal activities as told by your health care provider. Ask your health care provider what activities are safe for you.  Do not drive for 24 hours if you were given a medicine to help you relax (sedative).  Keep all follow-up visits as told by your health care provider. This is important. Contact a health care provider if:  You have more redness, swelling, or pain around the puncture site.  You have more fluid or blood coming from the puncture site.  Your puncture site feels warm to the touch.  You have pus or a bad smell coming from the puncture site.  You have a fever.  Your pain is not controlled with medicine. This information is not intended to replace advice given to you by your health care provider.  Make sure you discuss any questions you have with your health care provider. Document Released: 08/15/2004 Document Revised: 08/16/2015 Document Reviewed: 07/10/2015 Elsevier Interactive Patient Education  2018 [employer].

## 2017-04-30 NOTE — Telephone Encounter (Signed)
-----   Message from Trecia Rogers, [place] sent at 04/29/2017  9:19 AM EDT ----- Please inform patient of results

## 2017-04-30 NOTE — Progress Notes (Addendum)
INDICATION: elevated hemoglobin, rule out myelofibrosis   Bone Marrow Biopsy and Aspiration Procedure Note   Informed consent was obtained and potential risks including bleeding, infection and pain were reviewed with the patient.  The patient's name, date of birth, identification, consent and allergies were verified prior to the start of procedure and time out was performed.  The left posterior iliac crest was chosen as the site of biopsy.  The skin was prepped with ChloraPrep.   15 cc of 2% lidocaine was used to provide local anaesthesia.   10 cc of bone marrow aspirate was obtained followed by 1cm biopsy.  Pressure was applied to the biopsy site and bandage was placed over the biopsy site. Patient was made to lie on the back for 30 mins prior to discharge.  The procedure was tolerated well. COMPLICATIONS: None BLOOD LOSS: none The patient was discharged home in stable condition with a follow up to review results.  Patient was provided with post bone marrow biopsy instructions and instructed to call if there was any bleeding or worsening pain.  Specimens sent for flow cytometry, cytogenetics per Dr. Earlie Server.  Signed Scot Dock, NP  Attending note: I supervised the above bone marrow aspiration and biopsy.  I was present from the consent to the completion of the procedure.  Patient tolerated the procedure extremely well.  No complications no blood loss.  Signed Dr.Vinay Lindi Adie MD MPH

## 2017-04-30 NOTE — Telephone Encounter (Signed)
CMA call patient second attempt   Patient verify DOB   Patient was aware and understood

## 2017-05-06 ENCOUNTER — Encounter: Payer: Self-pay | Admitting: Gastroenterology

## 2017-05-06 ENCOUNTER — Other Ambulatory Visit: Payer: Self-pay

## 2017-05-06 ENCOUNTER — Ambulatory Visit (AMBULATORY_SURGERY_CENTER): Payer: Self-pay | Admitting: Gastroenterology

## 2017-05-06 VITALS — BP 135/84 | HR 89 | Temp 98.9°F | Resp 11 | Ht 66.0 in | Wt 188.0 lb

## 2017-05-06 DIAGNOSIS — Z1211 Encounter for screening for malignant neoplasm of colon: Secondary | ICD-10-CM

## 2017-05-06 DIAGNOSIS — Z1212 Encounter for screening for malignant neoplasm of rectum: Secondary | ICD-10-CM

## 2017-05-06 MED ORDER — SODIUM CHLORIDE 0.9 % IV SOLN: 500.0000 mL | Freq: Once | INTRAVENOUS | Status: DC

## 2017-05-06 NOTE — Op Note (Signed)
[facility] Patient Name: Aaron Dennis Procedure Date: 05/06/2017 9:41 AM MRN: [id number] Endoscopist: Jackquline Denmark MD, MD Age: 62 Referring MD:  Date of Birth: 10-02-1955 Gender: Male Account #: [id number] Procedure:                Colonoscopy Indications:              Screening for colorectal malignant neoplasm Medicines:                Monitored Anesthesia Care Procedure:                Pre-Anesthesia Assessment:                           - Prior to the procedure, a History and Physical                            was performed, and patient medications and                            allergies were reviewed. The patient is competent.                            The risks and benefits of the procedure and the                            sedation options and risks were discussed with the                            patient. All questions were answered and informed                            consent was obtained. Patient identification and                            proposed procedure were verified by the physician.                            Mental Status Examination: alert and oriented.                            Prophylactic Antibiotics: The patient does not                            require prophylactic antibiotics. Prior                            Anticoagulants: The patient has taken no previous                            anticoagulant or antiplatelet agents. ASA Grade                            Assessment: II - A patient with mild systemic  disease. After reviewing the risks and benefits,                            the patient was deemed in satisfactory condition to                            undergo the procedure. The anesthesia plan was to                            use monitored anesthesia care (MAC). Immediately                            prior to administration of medications, the patient                            was re-assessed for adequacy  to receive sedatives.                            The heart rate, respiratory rate, oxygen                            saturations, blood pressure, adequacy of pulmonary                            ventilation, and response to care were monitored                            throughout the procedure. The physical status of                            the patient was re-assessed after the procedure.                           After obtaining informed consent, the colonoscope                            was passed under direct vision. Throughout the                            procedure, the patient's blood pressure, pulse, and                            oxygen saturations were monitored continuously. The                            Colonoscope was introduced through the anus and                            advanced to the the terminal ileum, with                            identification of the appendiceal orifice and IC  valve. The colonoscopy was performed without                            difficulty. Retroflexed exam was performed in the                            rectum and ascending colon. The patient tolerated                            the procedure well. The quality of the bowel                            preparation was good. Scope In: 10:12:39 AM Scope Out: 10:29:54 AM Scope Withdrawal Time: 0 hours 13 minutes 33 seconds  Total Procedure Duration: 0 hours 17 minutes 15 seconds  Findings:                 The perianal exam findings include internal                            hemorrhoids that prolapse with straining, but                            spontaneously regress to the resting position                            (Grade II).                           The exam was otherwise without abnormality on                            direct and retroflexion views. Complications:            No immediate complications. Estimated Blood Loss:     Estimated blood loss:  none. Impression:               - Internal hemorrhoids.                           - Otherwise normal to terminal ileum. Recommendation:           - Patient has a contact number available for                            emergencies. The signs and symptoms of potential                            delayed complications were discussed with the                            patient. Return to normal activities tomorrow.                            Written discharge instructions were provided to the  patient.                           - High fiber diet.                           - Continue present medications.                           - Repeat colonoscopy in 10 years for surveillance,                            earlier if any new problems or change in family                            history.                           - Return to GI clinic PRN. Please call if he any                            anorectal problems in the future. Jackquline Denmark MD, MD 05/06/2017 10:48:52 AM This report has been signed electronically.

## 2017-05-06 NOTE — Progress Notes (Signed)
Pt's states no medical or surgical changes since previsit or office visit. 

## 2017-05-06 NOTE — Patient Instructions (Signed)
YOU HAD AN ENDOSCOPIC PROCEDURE TODAY AT [facility]:   Refer to the procedure report that was given to you for any specific questions about what was found during the examination.  If the procedure report does not answer your questions, please call your gastroenterologist to clarify.  If you requested that your care partner not be given the details of your procedure findings, then the procedure report has been included in a sealed envelope for you to review at your convenience later.  YOU SHOULD EXPECT: Some feelings of bloating in the abdomen. Passage of more gas than usual.  Walking can help get rid of the air that was put into your GI tract during the procedure and reduce the bloating. If you had a lower endoscopy (such as a colonoscopy or flexible sigmoidoscopy) you may notice spotting of blood in your stool or on the toilet paper. If you underwent a bowel prep for your procedure, you may not have a normal bowel movement for a few days.  Please Note:  You might notice some irritation and congestion in your nose or some drainage.  This is from the oxygen used during your procedure.  There is no need for concern and it should clear up in a day or so.  SYMPTOMS TO REPORT IMMEDIATELY:   Following lower endoscopy (colonoscopy or flexible sigmoidoscopy):  Excessive amounts of blood in the stool  Significant tenderness or worsening of abdominal pains  Swelling of the abdomen that is new, acute  Fever of 100F or higher   For urgent or emergent issues, a gastroenterologist can be reached at any hour by calling [phone].   DIET:  We do recommend a small meal at first, but then you may proceed to your regular diet.  Drink plenty of fluids but you should avoid alcoholic beverages for 24 hours. Try to eat a lot of fiber, and drink plenty of water.  ACTIVITY:  You should plan to take it easy for the rest of today and you should NOT DRIVE or use heavy machinery until tomorrow  (because of the sedation medicines used during the test).    FOLLOW UP: Our staff will call the number listed on your records the next business day following your procedure to check on you and address any questions or concerns that you may have regarding the information given to you following your procedure. If we do not reach you, we will leave a message.  However, if you are feeling well and you are not experiencing any problems, there is no need to return our call.  We will assume that you have returned to your regular daily activities without incident.  If any biopsies were taken you will be contacted by phone or by letter within the next 1-3 weeks.  Please call us at [phone] if you have not heard about the biopsies in 3 weeks.    SIGNATURES/CONFIDENTIALITY: You and/or your care partner have signed paperwork which will be entered into your electronic medical record.  These signatures attest to the fact that that the information above on your After Visit Summary has been reviewed and is understood.  Full responsibility of the confidentiality of this discharge information lies with you and/or your care-partner.  Read all of the handouts given to you by your recovery room nurse.

## 2017-05-07 ENCOUNTER — Telehealth: Payer: Self-pay | Admitting: *Deleted

## 2017-05-07 ENCOUNTER — Telehealth: Payer: Self-pay

## 2017-05-07 NOTE — Telephone Encounter (Signed)
No answer, message left for the patient. 

## 2017-05-07 NOTE — Telephone Encounter (Signed)
Attempted to reach pt. With follow up call following endoscopic procedure 05/06/2017.  LM on pt. Ans. Machine to call us if he has any questions or concerns.

## 2017-05-11 ENCOUNTER — Telehealth: Payer: Self-pay | Admitting: Medical Oncology

## 2017-05-11 NOTE — Telephone Encounter (Signed)
Returned call. Per Julien Nordmann , "nothing serious" and he will see him when he gets back for being out of town. I left message for pt to call me back and tell me how long he will be OOT.

## 2017-05-12 ENCOUNTER — Encounter: Payer: Self-pay | Admitting: Family Medicine

## 2017-05-12 ENCOUNTER — Ambulatory Visit: Payer: Self-pay | Attending: Family Medicine | Admitting: Family Medicine

## 2017-05-12 ENCOUNTER — Telehealth: Payer: Self-pay | Admitting: Medical Oncology

## 2017-05-12 VITALS — BP 133/80 | HR 75 | Temp 97.5°F | Ht 66.0 in | Wt 186.2 lb

## 2017-05-12 DIAGNOSIS — E559 Vitamin D deficiency, unspecified: Secondary | ICD-10-CM | POA: Insufficient documentation

## 2017-05-12 DIAGNOSIS — D751 Secondary polycythemia: Secondary | ICD-10-CM | POA: Insufficient documentation

## 2017-05-12 DIAGNOSIS — M5135 Other intervertebral disc degeneration, thoracolumbar region: Secondary | ICD-10-CM | POA: Insufficient documentation

## 2017-05-12 DIAGNOSIS — Z79899 Other long term (current) drug therapy: Secondary | ICD-10-CM | POA: Insufficient documentation

## 2017-05-12 DIAGNOSIS — I10 Essential (primary) hypertension: Secondary | ICD-10-CM | POA: Insufficient documentation

## 2017-05-12 DIAGNOSIS — K219 Gastro-esophageal reflux disease without esophagitis: Secondary | ICD-10-CM | POA: Insufficient documentation

## 2017-05-12 MED ORDER — METHOCARBAMOL 750 MG PO TABS: 750.0000 mg | ORAL_TABLET | Freq: Two times a day (BID) | ORAL | 1 refills | Status: DC | PRN

## 2017-05-12 MED ORDER — AMLODIPINE BESYLATE 5 MG PO TABS: 5.0000 mg | ORAL_TABLET | Freq: Every day | ORAL | 1 refills | Status: DC

## 2017-05-12 MED ORDER — OMEPRAZOLE 40 MG PO CPDR
40.0000 mg | DELAYED_RELEASE_CAPSULE | Freq: Every day | ORAL | 1 refills | Status: DC
Start: 2017-05-12 — End: 2017-11-09

## 2017-05-12 MED ORDER — ERGOCALCIFEROL 1.25 MG (50000 UT) PO CAPS: 50000.0000 [IU] | ORAL_CAPSULE | ORAL | 1 refills | Status: DC

## 2017-05-12 MED FILL — OMEPRAZOLE DR 40 MG CAPSULE: 40 | 60 days supply | Qty: 60 | Fill #0

## 2017-05-12 MED FILL — VIT D2 1.25 MG (50,000 UNIT: 1.25 MG | 56 days supply | Qty: 8 | Fill #0

## 2017-05-12 MED FILL — AMLODIPINE BESYLATE 5 MG TA: 5 | 60 days supply | Qty: 60 | Fill #0

## 2017-05-12 NOTE — Progress Notes (Signed)
Patient needs medication for 2 months due to going out of town,

## 2017-05-12 NOTE — Telephone Encounter (Signed)
He will call to schedule an appt when he gets back in town. He does not know when that will be.

## 2017-05-12 NOTE — Patient Instructions (Signed)
Polycythemia Vera Polycythemia vera (PV), or myeloproliferative disease, is a form of blood cancer in which the bone marrow makes too many (overproduces) red blood cells. The bone marrow may also make too many clotting cells (platelets) and white blood cells. Bone marrow is the spongy center of bones where blood cells are produced. Sometimes, there may be an overproduction of blood cells in the liver and spleen, causing those organs to become enlarged. Additionally, people who have PV are at a higher risk for stroke or heart attack because their blood may clot more easily. PV is a long-term disease. What are the causes? Almost all people who have PV have an abnormal gene (genetic mutation) that causes changes in the way that the bone marrow makes blood cells. This gene, which is called JAK2, is not passed along from parent to child (is not hereditary). It is not known what triggers the genetic mutation that causes the body to produce too many red blood cells. What increases the risk? This condition is more likely to develop in:  Males.  People who are 62 years of age or older.  What are the signs or symptoms? You may not have any symptoms in the early stage of PV. When symptoms develop, they may include:  Shortness of breath.  Dizziness.  Hot and flushed skin.  Itchy skin.  Sweats, especially night sweats.  Headache.  Tiredness.  Ringing in the ears.  Blurred vision or blind spots.  Bone pain.  Weight loss.  Fever.  Blood-tinged vomit or bowel movements.  How is this diagnosed? This condition may be diagnosed during a routine physical exam if you have a blood test called a complete blood count (CBC). Your health care provider also may suspect PV if you have symptoms. During the physical exam, your provider may find that you have an enlarged liver or spleen. You may also have tests to confirm the diagnosis. These may include:  A procedure to remove a sample of bone marrow  for testing (bone marrow biopsy).  Blood tests to check for: ? The JAK2 gene. ? Low levels of a hormone that helps to regulate blood production (erythropoietin).  How is this treated? There is no cure for PV, but treatment can help to control the disease. There are several types of treatment. No single treatment works for everyone. You will need to work with a blood cancer specialist (hematologist) to find the treatment that is best for you. Options include:  Periodically having some blood removed with a needle (drawn) to lower the number of red blood cells (phlebotomy).  Medicine. Your health care provider may recommend: ? Low-dose aspirin to lower your risk for blood clots. ? A medicine to reduce red blood cell production (hydroxyurea). ? A medicine to lower the number of red blood cells (interferon). ? A medicine that slows down the effects of JAK2 (ruxolitinib).  Follow these instructions at home:  Take over-the-counter and prescription medicines only as told by your health care provider.  Return to your normal activities as told by your health care provider. Ask your health care provider what activities are safe for you.  Do not use tobacco products, including cigarettes, chewing tobacco, or e-cigarettes. If you need help quitting, ask your health care provider.  Keep all follow-up visits as told by your health care provider. This is important. Contact a health care provider if:  You have side effects from your medicines.  Your symptoms change or get worse at home.  You have  blood in your stool or you vomit blood. Get help right away if:  You have sudden and severe pain in your abdomen.  You have chest pain or difficulty breathing.  You have signs of stroke, such as: ? Sudden numbness. ? Weakness of your face or arm. ? Confusion. ? Difficulty speaking or understanding speech. These symptoms may represent a serious problem that is an emergency. Do not wait to see if  the symptoms will go away. Get medical help right away. Call your local emergency services (911 in the [place] Do not drive yourself to the hospital. This information is not intended to replace advice given to you by your health care provider. Make sure you discuss any questions you have with your health care provider. Document Released: 10/21/2000 Document Revised: 07/04/2015 Document Reviewed: 08/08/2014 Elsevier Interactive Patient Education  [employer].

## 2017-05-12 NOTE — Progress Notes (Signed)
Subjective:  Patient ID: Aaron Dennis, male    DOB: Apr 15, 1955  Age: 62 y.o. MRN: [id number]  CC: Hypertension   HPI Aaron Dennis is a 62 year old male with a history of hypertension, polycythemia (s/p phlebotomy in the past), chronic thoracolumbar pain, GERD here for follow-up visit.  He will be traveling out of the country to [place] for the next 2 months and is requesting supply of his medications to last him the duration of time.   His thoracolumbar pain is doing well today and is controlled on his current muscle relaxants. He has a history of chronic thoracolumbar pain which he describes as a 7/10 for the last 10 years worsened by driving and relieved by resting or lying supine but he denies taking analgesics.  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  Workup by previous PCP revealed mildly elevated alkaline phosphatase, vitamin D deficiency spine x-ray from 12/2015 revealed mild endplate spondylosis at multiple levels and most recently PET scan was negative for hypermetabolic bone lesions but revealed chronic mixed and lytic sclerotic changes throughout the skeleton which could represent myelofibrosis or metabolic bone disorder. Recently seen by Dr. Earlie Server of Oncology and underwent bone marrow biopsy which revealed  - HYPERCELLULAR BONE MARROW WITH TRILINEAGE HEMATOPOIESIS. - SEE COMMENT. PERIPHERAL BLOOD: - ERYTHROCYTOSIS. Diagnosis Note The bone marrow is hypercellular with panmyeloid proliferation including increased number of megakaryocytes, some of which display large and slightly atypical morphology. No increase in blastic cells is identified. The overall findings are not considered specific but an early myeloproliferative neoplasm is not excluded. Correlation with cytogenetic and molecular studies particularly JAK2 is recommended.   He has no additional concerns today.  Past Medical History:  Diagnosis Date  . GERD (gastroesophageal reflux disease)     . Hypertension   . Lung nodule    7 mm LUL nodule    History reviewed. No pertinent surgical history.  No Known Allergies   Outpatient Medications Prior to Visit  Medication Sig Dispense Refill  . amLODipine (NORVASC) 5 MG tablet Take 1 tablet (5 mg total) by mouth daily. 30 tablet 0  . ergocalciferol (DRISDOL) 50000 units capsule Take 1 capsule (50,000 Units total) by mouth once a week. 9 capsule 1  . methocarbamol (ROBAXIN-750) 750 MG tablet Take 1 tablet (750 mg total) by mouth 2 (two) times daily as needed for muscle spasms. 60 tablet 1  . omeprazole (PRILOSEC) 40 MG capsule Take 1 capsule (40 mg total) by mouth daily. 30 capsule 0  . triamcinolone ointment (KENALOG) 0.5 % Apply 1 application topically 2 (two) times daily. (Patient not taking: Reported on 05/12/2017) 30 g 0   Facility-Administered Medications Prior to Visit  Medication Dose Route Frequency Provider Last Rate Last Dose  . 0.9 %  sodium chloride infusion  500 mL Intravenous Once Jackquline Denmark, MD        ROS Review of Systems  Constitutional: Negative for activity change and appetite change.  HENT: Negative for sinus pressure and sore throat.   Eyes: Negative for visual disturbance.  Respiratory: Negative for cough, chest tightness and shortness of breath.   Cardiovascular: Negative for chest pain and leg swelling.  Gastrointestinal: Negative for abdominal distention, abdominal pain, constipation and diarrhea.  Endocrine: Negative.   Genitourinary: Negative for dysuria.  Musculoskeletal: Negative for joint swelling and myalgias.  Skin: Negative for rash.  Allergic/Immunologic: Negative.   Neurological: Negative for weakness, light-headedness and numbness.  Psychiatric/Behavioral: Negative for  dysphoric mood and suicidal ideas.    Objective:  BP 133/80   Pulse 75   Temp (!) 97.5 F (36.4 C) (Oral)   Ht 5' 6"  (1.676 m)   Wt 186 lb 3.2 oz (84.5 kg)   SpO2 99%   BMI 30.05 kg/m   BP/Weight 05/12/2017  05/06/2017 4/62/7035  Systolic BP 009 381 829  Diastolic BP 80 84 91  Wt. (Lbs) 186.2 188 -  BMI 30.05 30.34 -      Physical Exam  Constitutional: He is oriented to person, place, and time. He appears well-developed and well-nourished.  Cardiovascular: Normal rate, normal heart sounds and intact distal pulses.  No murmur heard. Pulmonary/Chest: Effort normal and breath sounds normal. He has no wheezes. He has no rales. He exhibits no tenderness.  Abdominal: Soft. Bowel sounds are normal. He exhibits no distension and no mass. There is no tenderness.  Musculoskeletal: Normal range of motion.  Neurological: He is alert and oriented to person, place, and time.  Skin: Skin is warm and dry.  Psychiatric: He has a normal mood and affect.     Assessment & Plan:   1. Essential hypertension Controlled - amLODipine (NORVASC) 5 MG tablet; Take 1 tablet (5 mg total) by mouth daily.  Dispense: 90 tablet; Refill: 1  2. Gastroesophageal reflux disease without esophagitis Stable - omeprazole (PRILOSEC) 40 MG capsule; Take 1 capsule (40 mg total) by mouth daily.  Dispense: 90 capsule; Refill: 1  3. Polycythemia No indication for phlebotomy at this time We will repeat CBC at next visit Recently seen by  hematology  4. Vitamin D deficiency We will repeat vitamin D level at next visit - ergocalciferol (DRISDOL) 50000 units capsule; Take 1 capsule (50,000 Units total) by mouth once a week.  Dispense: 9 capsule; Refill: 1  5. DDD (degenerative disc disease), thoracolumbar Intermittent pain - methocarbamol (ROBAXIN-750) 750 MG tablet; Take 1 tablet (750 mg total) by mouth 2 (two) times daily as needed for muscle spasms.  Dispense: 180 tablet; Refill: 1   Meds ordered this encounter  Medications  . amLODipine (NORVASC) 5 MG tablet    Sig: Take 1 tablet (5 mg total) by mouth daily.    Dispense:  90 tablet    Refill:  1  . ergocalciferol (DRISDOL) 50000 units capsule    Sig: Take 1 capsule  (50,000 Units total) by mouth once a week.    Dispense:  9 capsule    Refill:  1  . methocarbamol (ROBAXIN-750) 750 MG tablet    Sig: Take 1 tablet (750 mg total) by mouth 2 (two) times daily as needed for muscle spasms.    Dispense:  180 tablet    Refill:  1  . omeprazole (PRILOSEC) 40 MG capsule    Sig: Take 1 capsule (40 mg total) by mouth daily.    Dispense:  90 capsule    Refill:  1    Follow-up: Return in about 3 months (around 08/11/2017) for follow up of chronic medical conditions.   Charlott Rakes MD

## 2017-05-13 ENCOUNTER — Encounter (HOSPITAL_COMMUNITY): Payer: Self-pay | Admitting: Internal Medicine

## 2017-05-14 LAB — CHROMOSOME ANALYSIS, BONE MARROW

## 2017-06-29 ENCOUNTER — Encounter: Payer: Self-pay | Admitting: Thoracic Surgery (Cardiothoracic Vascular Surgery)

## 2017-06-29 ENCOUNTER — Ambulatory Visit (INDEPENDENT_AMBULATORY_CARE_PROVIDER_SITE_OTHER): Payer: Self-pay | Admitting: Thoracic Surgery (Cardiothoracic Vascular Surgery)

## 2017-06-29 ENCOUNTER — Other Ambulatory Visit: Payer: Self-pay

## 2017-06-29 VITALS — BP 128/85 | HR 77 | Resp 18 | Ht 66.0 in | Wt 177.8 lb

## 2017-06-29 DIAGNOSIS — R911 Solitary pulmonary nodule: Secondary | ICD-10-CM

## 2017-06-29 NOTE — Progress Notes (Signed)
[address]       [place] [address]             [phone]       HPI: Aaron Dennis returns for a scheduled follow-up visit  Aaron Dennis is a 62 year old man with a history of tobacco abuse (hookah), vitamin D deficiency, hypertension, reflux, and a left upper lobe lung nodule.  His left upper lobe lung nodule was first noted on a CT of the chest in 2015 when he had a respiratory infection.  In November 2017 he had a follow-up CT which showed the nodule had increased in size to 6 x 7 mm.  He was not interested in surgery and wished to continue with radiographic follow-up.  A follow-up CT in May 2018 showed no significant change in size over 40-month period.  I recommended another CT at 6 months but he was going to be out of the country and wished to wait a year.  In the interim he had a PET/CT to evaluate some lytic bone lesions in March 2019.  He saw Dr. Julien Nordmann in consultation and that work-up is ongoing.  The lung nodule was only 5 mm on that scan.  In the interim since his last visit he also has stopped smoking hookah. Past Medical History:  Diagnosis Date  . GERD (gastroesophageal reflux disease)   . Hypertension   . Lung nodule    7 mm LUL nodule    Current Outpatient Medications  Medication Sig Dispense Refill  . amLODipine (NORVASC) 5 MG tablet Take 1 tablet (5 mg total) by mouth daily. 90 tablet 1  . ergocalciferol (DRISDOL) 50000 units capsule Take 1 capsule (50,000 Units total) by mouth once a week. 9 capsule 1  . methocarbamol (ROBAXIN-750) 750 MG tablet Take 1 tablet (750 mg total) by mouth 2 (two) times daily as needed for muscle spasms. 180 tablet 1  . omeprazole (PRILOSEC) 40 MG capsule Take 1 capsule (40 mg total) by mouth daily. 90 capsule 1  . triamcinolone ointment (KENALOG) 0.5 % Apply 1 application topically 2 (two) times daily. 30 g 0   Current Facility-Administered Medications  Medication Dose Route Frequency Provider Last Rate Last Dose    . 0.9 %  sodium chloride infusion  500 mL Intravenous Once Jackquline Denmark, MD        Physical Exam BP 128/85 (BP Location: Right Arm, Patient Position: Sitting, Cuff Size: Normal)   Pulse 77   Resp 18   Ht 5\' 6"  (1.676 m)   Wt 177 lb 12.8 oz (80.6 kg)   SpO2 99% Comment: RA  BMI 28.18 kg/m  62 year old man in no acute distress Alert and oriented x3 with no focal deficits Lungs clear with equal breath sounds bilaterally Cardiac regular rate and rhythm normal S1-S2  Diagnostic Tests: NUCLEAR MEDICINE PET SKULL BASE TO THIGH  TECHNIQUE: 9.0 mCi F-18 FDG was injected intravenously. Full-ring PET imaging was performed from the skull base to thigh after the radiotracer. CT data was obtained and used for attenuation correction and anatomic localization.  Fasting blood glucose: 94 mg/dl  Mediastinal blood pool activity: SUV max 2.54  COMPARISON:  CT chest 06/23/2016  FINDINGS: NECK: No hypermetabolic lymph nodes in the neck.  Incidental CT findings: none  CHEST: No hypermetabolic mediastinal or hilar nodes. No suspicious pulmonary nodules on the CT scan.  Incidental CT findings: Left upper lobe pulmonary nodule measures 5 mm and is not significantly changed from previous  exam. This is too small to reliably characterize by PET-CT. No additional pulmonary nodules identified.  ABDOMEN/PELVIS: No abnormal hypermetabolic activity within the liver, pancreas, adrenal glands, or spleen. No hypermetabolic lymph nodes in the abdomen or pelvis. There is a hypermetabolic nodule within the posterior aspect of the prostate gland abutting the anterior wall of rectum. Measuring approximately 1.5 cm with an SUV max of 21.5. A second hypermetabolic nodule is noted within the left side of gland measuring 1.5 cm within SUV max of 21.5.  Incidental CT findings: none  SKELETON: On the PET images there is no focal area of increased uptake beyond background heterogeneous bone  activity.  Incidental CT findings: Again noted is a nonspecific moth eaten and sclerotic appearance of the bones throughout the visualized axial skeleton. No discrete lesions visualized.  IMPRESSION: 1. No focal areas of increased uptake identified above background bone activity to suggest a focally hypermetabolic bone lesion. 2. Similar appearance of chronic mixed and lytic sclerotic changes throughout the skeleton which could represent myelofibrosis or metabolic bone disorder. 3. Stable left upper lobe pulmonary nodule measuring 5 mm. Too small to characterize by PET-CT.   Electronically Signed   By: Kerby Moors M.D.   On: 04/19/2017 10:31 I personally reviewed the PET images and concur with the findings noted above.  Impression: Aaron Dennis is a 62 year old man with a history of tobacco use who has a small left upper lobe lung nodule.  This was initially noted on a CT in 2015.  On a repeat CT a couple of years later it appeared slightly larger.  It has now been stable in size over multiple scans dating back to November 2017.  This most likely is a benign nodule.  The lack of metabolic activity on PET/CT does not help given its small size.  I recommended the return in 1 year with another CT chest.  If it is stable at that time we can discuss discontinuing follow-up.  Plan: Follow-up with Dr. Earlie Server  I will see him back in 10 months (1 year from previous scan) with a CT of chest  Melrose Nakayama, MD [facility] [phone]

## 2017-07-12 ENCOUNTER — Emergency Department (HOSPITAL_COMMUNITY)
Admission: EM | Admit: 2017-07-12 | Discharge: 2017-07-12 | Disposition: A | Discharge status: Home / Self Care | Payer: Self-pay | Attending: Emergency Medicine | Admitting: Emergency Medicine

## 2017-07-12 ENCOUNTER — Encounter (HOSPITAL_COMMUNITY): Payer: Self-pay | Admitting: Emergency Medicine

## 2017-07-12 ENCOUNTER — Emergency Department (HOSPITAL_COMMUNITY): Payer: Self-pay

## 2017-07-12 DIAGNOSIS — I10 Essential (primary) hypertension: Secondary | ICD-10-CM | POA: Insufficient documentation

## 2017-07-12 DIAGNOSIS — R42 Dizziness and giddiness: Secondary | ICD-10-CM | POA: Insufficient documentation

## 2017-07-12 DIAGNOSIS — Z79899 Other long term (current) drug therapy: Secondary | ICD-10-CM | POA: Insufficient documentation

## 2017-07-12 LAB — URINALYSIS, ROUTINE W REFLEX MICROSCOPIC
Bilirubin Urine: NEGATIVE
Glucose, UA: NEGATIVE mg/dL
Hgb urine dipstick: NEGATIVE
Ketones, ur: NEGATIVE mg/dL
Leukocytes, UA: NEGATIVE
Nitrite: NEGATIVE
Protein, ur: NEGATIVE mg/dL
Specific Gravity, Urine: 1.011 (ref 1.005–1.030)
pH: 8 (ref 5.0–8.0)

## 2017-07-12 LAB — COMPREHENSIVE METABOLIC PANEL
ALT: 16 U/L — ABNORMAL LOW (ref 17–63)
AST: 17 U/L (ref 15–41)
Albumin: 4 g/dL (ref 3.5–5.0)
Alkaline Phosphatase: 137 U/L — ABNORMAL HIGH (ref 38–126)
Anion gap: 11 (ref 5–15)
BUN: 11 mg/dL (ref 6–20)
CO2: 24 mmol/L (ref 22–32)
Calcium: 8.8 mg/dL — ABNORMAL LOW (ref 8.9–10.3)
Chloride: 105 mmol/L (ref 101–111)
Creatinine, Ser: 0.97 mg/dL (ref 0.61–1.24)
GFR calc Af Amer: >60 mL/min (ref >60)
GFR calc non Af Amer: >60 mL/min (ref >60)
Glucose, Bld: 106 mg/dL — ABNORMAL HIGH (ref 65–99)
Potassium: 3.8 mmol/L (ref 3.5–5.1)
Sodium: 140 mmol/L (ref 135–145)
Total Bilirubin: 2.3 mg/dL — ABNORMAL HIGH (ref 0.3–1.2)
Total Protein: 7.3 g/dL (ref 6.5–8.1)

## 2017-07-12 LAB — CBC
HCT: 51.7 % (ref 39.0–52.0)
Hemoglobin: 17.9 g/dL — ABNORMAL HIGH (ref 13.0–17.0)
MCH: 29.3 pg (ref 26.0–34.0)
MCHC: 34.6 g/dL (ref 30.0–36.0)
MCV: 84.8 fL (ref 78.0–100.0)
Platelets: 213 10*3/uL (ref 150–400)
RBC: 6.1 MIL/uL — ABNORMAL HIGH (ref 4.22–5.81)
RDW: 13.4 % (ref 11.5–15.5)
WBC: 7.6 10*3/uL (ref 4.0–10.5)

## 2017-07-12 LAB — LIPASE, BLOOD: Lipase: 25 U/L (ref 11–51)

## 2017-07-12 MED ORDER — MECLIZINE HCL 25 MG PO TABS: 25.0000 mg | ORAL_TABLET | Freq: Three times a day (TID) | ORAL | 0 refills | Status: DC | PRN

## 2017-07-12 NOTE — ED Triage Notes (Signed)
Pt reports dizzy that started this morning around 10am. Reports that he is currently fasting for religous beliefs. Had nausea but denies vomiting.

## 2017-07-12 NOTE — ED Provider Notes (Signed)
A[facility] DEPT Provider Note   CSN: [id number] Arrival date & time: 07/12/17  1423     History   Chief Complaint Chief Complaint  Patient presents with  . Dizziness    HPI Aaron Dennis is a 62 y.o. male.  HPI   62 year old male with dizziness.  He describes a sensation of room spinning.  First noticed this morning while getting out of bed.  Intermittent symptoms since then.  Mild intermittent nausea.  Denies any acute pain but he does have a pressure-like sensation intermittently towards right side of his head and also feels like there is when blowing his right ear.  No acute visual changes.  No numbness or tingling or focal loss of strength.  Currently fasting for religious reasons.   Past Medical History:  Diagnosis Date  . GERD (gastroesophageal reflux disease)   . Hypertension   . Lung nodule    7 mm LUL nodule    Patient Active Problem List   Diagnosis Date Noted  . Polycythemia 05/12/2017  . Vitamin D deficiency 04/21/2017  . Bony sclerosis 08/05/2016  . Lung nodule   . Chronic pain of both knees 12/05/2015  . Gastroesophageal reflux disease without esophagitis 05/17/2014  . Essential hypertension 05/17/2014  . Coronary artery calcification 08/17/2012  . Chronic abdominal pain 06/24/2012  . Right-sided chest wall pain 06/24/2012  . Constipation, chronic 06/24/2012  . Hyperbilirubinemia 06/24/2012  . Chronic back pain 06/24/2012  . DDD (degenerative disc disease), thoracolumbar 06/24/2012  . History of nephrolithiasis 06/24/2012  . Side pain 06/24/2012  . Low back pain at multiple sites 06/24/2012    History reviewed. No pertinent surgical history.      Home Medications    Prior to Admission medications   Medication Sig Start Date End Date Taking? Authorizing Provider  amLODipine (NORVASC) 5 MG tablet Take 1 tablet (5 mg total) by mouth daily. 05/12/17  Yes Charlott Rakes, MD  ergocalciferol (DRISDOL) 50000 units  capsule Take 1 capsule (50,000 Units total) by mouth once a week. 05/12/17  Yes Charlott Rakes, MD  omeprazole (PRILOSEC) 40 MG capsule Take 1 capsule (40 mg total) by mouth daily. 05/12/17  Yes Charlott Rakes, MD  methocarbamol (ROBAXIN-750) 750 MG tablet Take 1 tablet (750 mg total) by mouth 2 (two) times daily as needed for muscle spasms. Patient not taking: Reported on 07/12/2017 05/12/17   Charlott Rakes, MD  triamcinolone ointment (KENALOG) 0.5 % Apply 1 application topically 2 (two) times daily. Patient not taking: Reported on 07/12/2017 04/07/17   Tresa Garter, MD    Family History Family History  Problem Relation Age of Onset  . Diabetes Brother   . Colon polyps Neg Hx   . Colon cancer Neg Hx   . Esophageal cancer Neg Hx   . Rectal cancer Neg Hx   . Stomach cancer Neg Hx     Social History Social History   Tobacco Use  . Smoking status: Former Research scientist (life sciences)  . Smokeless tobacco: Never Used  . Tobacco comment: quit Jan 2018  Substance Use Topics  . Alcohol use: No  . Drug use: No     Allergies   Patient has no known allergies.   Review of Systems Review of Systems  All systems reviewed and negative, other than as noted in HPI.  Physical Exam Updated Vital Signs BP 133/87   Pulse 70   Temp 98.4 F (36.9 C) (Oral)   Resp 15   Ht 6' 0.5" (1.842  m)   Wt 80 kg (176 lb 5 oz)   SpO2 100%   BMI 23.58 kg/m   Physical Exam  Constitutional: He is oriented to person, place, and time. He appears well-developed and well-nourished. No distress.  HENT:  Head: Normocephalic and atraumatic.  External auditory canals clear bilaterally and TMs normal in appearance.  Eyes: Pupils are equal, round, and reactive to light. Conjunctivae and EOM are normal. Right eye exhibits no discharge. Left eye exhibits no discharge.  No nystagmus appreciated  Neck: Neck supple.  Cardiovascular: Normal rate, regular rhythm and normal heart sounds. Exam reveals no gallop and no friction rub.    No murmur heard. Pulmonary/Chest: Effort normal and breath sounds normal. No respiratory distress.  Abdominal: Soft. He exhibits no distension. There is no tenderness.  Musculoskeletal: He exhibits no edema or tenderness.  Neurological: He is alert and oriented to person, place, and time. No cranial nerve deficit. He exhibits normal muscle tone. Coordination normal.  Speech clear.  Content appropriate.  Following commands.  Cranial nerves II through X 12 intact bilaterally.  Strength is 5 out of 5 bilateral upper and lower extremities.  Steady gait.  Good finger-nose testing bilaterally.  Skin: Skin is warm and dry.  Psychiatric: He has a normal mood and affect. His behavior is normal. Thought content normal.  Nursing note and vitals reviewed.    ED Treatments / Results  Labs (all labs ordered are listed, but only abnormal results are displayed) Labs Reviewed  COMPREHENSIVE METABOLIC PANEL - Abnormal; Notable for the following components:      Result Value   Glucose, Bld 106 (*)    Calcium 8.8 (*)    ALT 16 (*)    Alkaline Phosphatase 137 (*)    Total Bilirubin 2.3 (*)    All other components within normal limits  CBC - Abnormal; Notable for the following components:   RBC 6.10 (*)    Hemoglobin 17.9 (*)    All other components within normal limits  LIPASE, BLOOD  URINALYSIS, ROUTINE W REFLEX MICROSCOPIC    EKG None  Radiology No results found.  Procedures Procedures (including critical care time)  Medications Ordered in ED Medications - No data to display   Initial Impression / Assessment and Plan / ED Course  I have reviewed the triage vital signs and the nursing notes.  Pertinent labs & imaging results that were available during my care of the patient were reviewed by me and considered in my medical decision making (see chart for details).    62 year old male with dizziness.  Suspect this is peripheral vertigo.  He describes a sensation of room spinning and  associated when blowing sensation in his right ear.  His neuro exam is nonfocal.  He is mildly polycythemic, but this is chronic. I doubt contributory. CT results reveiwed and neg. Plan symptomatic tx.   Final Clinical Impressions(s) / ED Diagnoses   Final diagnoses:  Vertigo    ED Discharge Orders    None       Virgel Manifold, MD 07/14/17 1227

## 2017-07-14 MED FILL — OMEPRAZOLE DR 40 MG CAPSULE: 40 | 30 days supply | Qty: 30 | Fill #1

## 2017-07-14 MED FILL — MECLIZINE 25 MG TABLET: 25 | 30 days supply | Qty: 30 | Fill #0

## 2017-08-11 ENCOUNTER — Ambulatory Visit: Payer: Self-pay | Admitting: Family Medicine

## 2017-08-16 ENCOUNTER — Ambulatory Visit: Payer: Self-pay

## 2017-08-19 ENCOUNTER — Telehealth: Payer: Self-pay | Admitting: Internal Medicine

## 2017-08-19 NOTE — Telephone Encounter (Signed)
Scheduled appt per 04/28/2017 los - Pt was suppose to have f/u in April but was out of town scheduled appt for next available . - gave patient calender of f/u date.

## 2017-08-24 ENCOUNTER — Encounter: Payer: Self-pay | Admitting: Family Medicine

## 2017-08-24 ENCOUNTER — Ambulatory Visit: Payer: Self-pay | Attending: Family Medicine | Admitting: Family Medicine

## 2017-08-24 DIAGNOSIS — M5135 Other intervertebral disc degeneration, thoracolumbar region: Secondary | ICD-10-CM | POA: Insufficient documentation

## 2017-08-24 DIAGNOSIS — K219 Gastro-esophageal reflux disease without esophagitis: Secondary | ICD-10-CM | POA: Insufficient documentation

## 2017-08-24 DIAGNOSIS — I10 Essential (primary) hypertension: Secondary | ICD-10-CM | POA: Insufficient documentation

## 2017-08-24 DIAGNOSIS — D751 Secondary polycythemia: Secondary | ICD-10-CM | POA: Insufficient documentation

## 2017-08-24 DIAGNOSIS — Z79899 Other long term (current) drug therapy: Secondary | ICD-10-CM | POA: Insufficient documentation

## 2017-08-24 MED ORDER — AMLODIPINE BESYLATE 5 MG PO TABS: 5.0000 mg | ORAL_TABLET | Freq: Every day | ORAL | 1 refills | Status: DC

## 2017-08-24 MED ORDER — METHOCARBAMOL 750 MG PO TABS: 750.0000 mg | ORAL_TABLET | Freq: Two times a day (BID) | ORAL | 1 refills | Status: DC | PRN

## 2017-08-24 MED FILL — METHOCARBAMOL 750 MG TABS: 750 | 30 days supply | Qty: 60 | Fill #0

## 2017-08-24 MED FILL — AMLODIPINE BESYLATE 5 MG TA: 5 | 30 days supply | Qty: 30 | Fill #0

## 2017-08-24 NOTE — Progress Notes (Signed)
Subjective:  Patient ID: Aaron Dennis, male    DOB: [birth year]  Age: 62 y.o. MRN: [id number]  CC: Hypertension   HPI Aaron Dennis is a 62 year old male with a history of hypertension, polycythemia (s/p phlebotomy in the past), chronic thoracolumbar pain, GERD here for a follow-up visit.   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  Pain is more pronounced when he flexes his neck and is rated as a 7/10 and feels like a pulling sensation on the right side of his back he also complains of heaviness in the left side of his head but denies sinus pressure, lightheadedness or vertigo.  Last month he was seen at the ED for vertigo and CT of the head was negative for acute intracranial abnormality; plan is for symptomatic treatment. He denies headaches, blurry vision, nausea or vomiting. He is also followed by CT surgery-Dr. Roxan Hockey for lung nodule and was last seen in 06/2017 with plans for follow-up in 04/2018.  PET scan from 04/2017 revealed stable left upper lobe pulmonary nodule measuring 5 mm. He is doing well on his antihypertensive and his muscle relaxant is requesting a refill.  Past Medical History:  Diagnosis Date  . GERD (gastroesophageal reflux disease)   . Hypertension   . Lung nodule    7 mm LUL nodule    History reviewed. No pertinent surgical history.  No Known Allergies   Outpatient Medications Prior to Visit  Medication Sig Dispense Refill  . amLODipine (NORVASC) 5 MG tablet Take 1 tablet (5 mg total) by mouth daily. 90 tablet 1  . methocarbamol (ROBAXIN-750) 750 MG tablet Take 1 tablet (750 mg total) by mouth 2 (two) times daily as needed for muscle spasms. 180 tablet 1  . ergocalciferol (DRISDOL) 50000 units capsule Take 1 capsule (50,000 Units total) by mouth once a week. (Patient not taking: Reported on 08/24/2017) 9 capsule 1  . meclizine (ANTIVERT) 25 MG tablet Take 1 tablet (25 mg total) by mouth 3 (three) times daily as  needed for dizziness. (Patient not taking: Reported on 08/24/2017) 30 tablet 0  . omeprazole (PRILOSEC) 40 MG capsule Take 1 capsule (40 mg total) by mouth daily. (Patient not taking: Reported on 08/24/2017) 90 capsule 1  . triamcinolone ointment (KENALOG) 0.5 % Apply 1 application topically 2 (two) times daily. (Patient not taking: Reported on 07/12/2017) 30 g 0   Facility-Administered Medications Prior to Visit  Medication Dose Route Frequency Provider Last Rate Last Dose  . 0.9 %  sodium chloride infusion  500 mL Intravenous Once Jackquline Denmark, MD        ROS Review of Systems  Constitutional: Negative for activity change and appetite change.  HENT: Negative for sinus pressure and sore throat.   Eyes: Negative for visual disturbance.  Respiratory: Negative for cough, chest tightness and shortness of breath.   Cardiovascular: Negative for chest pain and leg swelling.  Gastrointestinal: Negative for abdominal distention, abdominal pain, constipation and diarrhea.  Endocrine: Negative.   Genitourinary: Negative for dysuria.  Musculoskeletal: Negative for joint swelling and myalgias.  Skin: Negative for rash.  Allergic/Immunologic: Negative.   Neurological: Negative for weakness, light-headedness and numbness.  Psychiatric/Behavioral: Negative for dysphoric mood and suicidal ideas.    Objective:  BP 136/74   Pulse 70   Temp (!) 97.4 F (36.3 C) (Oral)   Wt 186 lb 12.8 oz (84.7 kg)   SpO2 98%   BMI 24.99  kg/m   BP/Weight 08/24/2017 07/12/2017 0/09/6759  Systolic BP 950 932 671  Diastolic BP 74 86 85  Wt. (Lbs) 186.8 176.31 177.8  BMI 24.99 23.58 28.7     Physical Exam  Constitutional: He is oriented to person, place, and time. He appears well-developed and well-nourished.  Cardiovascular: Normal rate, normal heart sounds and intact distal pulses.  No murmur heard. Pulmonary/Chest: Effort normal and breath sounds normal. He has no wheezes. He has no rales. He exhibits no  tenderness.  Abdominal: Soft. Bowel sounds are normal. He exhibits no distension and no mass. There is no tenderness.  Musculoskeletal: Normal range of motion. He exhibits tenderness (TTP of right thoracolumbar muscles; negative straight leg raise b/l).  Neurological: He is alert and oriented to person, place, and time. No cranial nerve deficit or sensory deficit. He exhibits normal muscle tone. Coordination normal.  Skin: Skin is warm and dry.  Psychiatric: He has a normal mood and affect.    CMP Latest Ref Rng & Units 07/12/2017 04/28/2017 04/08/2017  Glucose 65 - 99 mg/dL 106(H) 127 77  BUN 6 - 20 mg/dL 11 12 12   Creatinine 0.61 - 1.24 mg/dL 0.97 0.92 0.91  Sodium 135 - 145 mmol/L 140 141 142  Potassium 3.5 - 5.1 mmol/L 3.8 3.4(L) 4.0  Chloride 101 - 111 mmol/L 105 107 103  CO2 22 - 32 mmol/L 24 25 23   Calcium 8.9 - 10.3 mg/dL 8.8(L) 9.5 9.2  Total Protein 6.5 - 8.1 g/dL 7.3 7.5 7.3  Total Bilirubin 0.3 - 1.2 mg/dL 2.3(H) 1.4(H) 1.4(H)  Alkaline Phos 38 - 126 U/L 137(H) 155(H) 162(H)  AST 15 - 41 U/L 17 20 19   ALT 17 - 63 U/L 16(L) 22 20    Lipid Panel     Component Value Date/Time   CHOL 171 04/08/2017 0942   TRIG 91 04/08/2017 0942   HDL 32 (L) 04/08/2017 0942   CHOLHDL 5.3 (H) 04/08/2017 0942   CHOLHDL 6.7 (H) 12/05/2015 1109   VLDL 23 12/05/2015 1109   LDLCALC 121 (H) 04/08/2017 0942     Assessment & Plan:   1. Essential hypertension Controlled Counseled on blood pressure goal of less than 130/80, low-sodium, DASH diet, medication compliance, 150 minutes of moderate intensity exercise per week. Discussed medication compliance, adverse effects. - amLODipine (NORVASC) 5 MG tablet; Take 1 tablet (5 mg total) by mouth daily.  Dispense: 90 tablet; Refill: 1  2. DDD (degenerative disc disease), thoracolumbar If symptoms are uncontrolled after PT I will refer him for repeat imaging - methocarbamol (ROBAXIN-750) 750 MG tablet; Take 1 tablet (750 mg total) by mouth 2 (two)  times daily as needed for muscle spasms.  Dispense: 180 tablet; Refill: 1 - Ambulatory referral to Physical Therapy  With regards to his symptoms I have encouraged him to use antihistamines as symptoms could be sinus related; neuro exam is unrevealing.  Meds ordered this encounter  Medications  . amLODipine (NORVASC) 5 MG tablet    Sig: Take 1 tablet (5 mg total) by mouth daily.    Dispense:  90 tablet    Refill:  1  . methocarbamol (ROBAXIN-750) 750 MG tablet    Sig: Take 1 tablet (750 mg total) by mouth 2 (two) times daily as needed for muscle spasms.    Dispense:  180 tablet    Refill:  1    Follow-up: Return in about 3 months (around 11/24/2017) for Follow-up of chronic medical conditions.   Charlott Rakes MD

## 2017-08-24 NOTE — Patient Instructions (Signed)

## 2017-09-03 ENCOUNTER — Ambulatory Visit: Payer: Self-pay | Attending: Family Medicine | Admitting: Physical Therapy

## 2017-09-03 ENCOUNTER — Encounter: Payer: Self-pay | Admitting: Physical Therapy

## 2017-09-03 DIAGNOSIS — M545 Low back pain, unspecified: Secondary | ICD-10-CM

## 2017-09-03 DIAGNOSIS — R252 Cramp and spasm: Secondary | ICD-10-CM | POA: Insufficient documentation

## 2017-09-03 DIAGNOSIS — G8929 Other chronic pain: Secondary | ICD-10-CM | POA: Insufficient documentation

## 2017-09-03 DIAGNOSIS — M542 Cervicalgia: Secondary | ICD-10-CM | POA: Insufficient documentation

## 2017-09-03 NOTE — Therapy (Signed)
[place], [place], [address] Phone: [phone]   Fax:  [phone]  Physical Therapy Evaluation  Patient Details  Name: Aaron Dennis MRN: [id number] Date of Birth: 03/07/1955 Referring Provider: Margarita Rana   Encounter Date: 09/03/2017  PT End of Session - 09/03/17 1127    Visit Number  1    Date for PT Re-Evaluation  11/04/17    PT Start Time  1101    PT Stop Time  1150    PT Time Calculation (min)  49 min    Activity Tolerance  Patient tolerated treatment well    Behavior During Therapy  [facility] for tasks assessed/performed       Past Medical History:  Diagnosis Date  . GERD (gastroesophageal reflux disease)   . Hypertension   . Lung nodule    7 mm LUL nodule    History reviewed. No pertinent surgical history.  There were no vitals filed for this visit.   Subjective Assessment - 09/03/17 1110    Subjective  Patient reports neck, thoracica nd lumbar pain for about 4 years, reports that he has had more pain recently especially turning head and lifting    Limitations  Lifting;House hold activities    Patient Stated Goals  have less pain    Currently in Pain?  Yes    Pain Score  8     Pain Location  Neck and b/n shoulder blades, some low back pain    Pain Descriptors / Indicators  Aching;Sore    Pain Type  Acute pain    Pain Onset  More than a month ago    Pain Frequency  Constant    Aggravating Factors   turning head, lifting, fast motions pain up to 9/10    Pain Relieving Factors  sleep pain can be 4/10    Effect of Pain on Daily Activities  difficulty driving, lifting         [facility] PT Assessment - 09/03/17 0001      Assessment   Medical Diagnosis  DDD for cervical, thoracic and lumbar     Referring Provider  [place]    Onset Date/Surgical Date  08/04/17      Precautions   Precautions  None      Balance Screen   Has the patient fallen in the past 6 months  No    Has the  patient had a decrease in activity level because of a fear of falling?   No    Is the patient reluctant to leave their home because of a fear of falling?   No      Prior Function   Level of Independence  Independent    Vocation  Full time employment    Vocation Requirements  taxi driver, does some lifting of bags      Posture/Postural Control   Posture Comments  fwd head, rounded shoulders      ROM / Strength   AROM / PROM / Strength  AROM;Strength      AROM   Overall AROM Comments  cervical ROM decreased 50% for all motions except extension decreased 75% all cause increase of pain in the neck and b/n the shoulders      Strength   Overall Strength Comments  4/5 for UE and LE with some pain in the neck area      Flexibility   Soft Tissue Assessment /Muscle Length  yes    Hamstrings  very tight 45 degrees SLR with back pain    Piriformis  very tight      Palpation   Palpation comment  he is very tight in the upper trap and the rhomboid area as well as the cervical and lumbar pspinals                Objective measurements completed on examination: See above findings.      [facility] Adult PT Treatment/Exercise - 09/03/17 0001      Modalities   Modalities  Electrical Stimulation;Moist Heat      Moist Heat Therapy   Number Minutes Moist Heat  15 Minutes    Moist Heat Location  Cervical;Lumbar Spine      Electrical Stimulation   Electrical Stimulation Location  C/T area    Electrical Stimulation Action  IFC    Electrical Stimulation Parameters  supine    Electrical Stimulation Goals  Pain             PT Education - 09/03/17 1127    Education Details  cervical gentle exercises    Person(s) Educated  Patient    Methods  Explanation;Demonstration;Handout    Comprehension  Verbalized understanding       PT Short Term Goals - 09/03/17 1131      PT SHORT TERM GOAL #1   Title  independent with initial HEP    Time  2    Period  Weeks    Status  New         PT Long Term Goals - 09/03/17 1131      PT LONG TERM GOAL #1   Title  understand proper posture and body mechanics    Time  8    Period  Weeks    Status  New      PT LONG TERM GOAL #2   Title  decrease pain 50%    Time  8    Period  Weeks    Status  New      PT LONG TERM GOAL #3   Title  increase cervical ROM 25%    Time  8    Period  Weeks    Status  New      PT LONG TERM GOAL #4   Title  report little issue with backing up the car    Time  8    Period  Weeks    Status  New             Plan - 09/03/17 1127    Clinical Impression Statement  Patient reports 4+ years of neck and low back pain.  MD order is for DDD.  Reports that over the past year he has had increased difficulty with turning head and lifting items, he drives a taxi for a job.  Has 50% decreased cervical ROM, has a lot of spasms    Clinical Presentation  Stable    Clinical Decision Making  Low    Rehab Potential  Good    PT Frequency  2x / week    PT Duration  8 weeks    PT Treatment/Interventions  ADLs/Self Care Home Management;Electrical Stimulation;Moist Heat;Traction;Therapeutic activities;Therapeutic exercise;Manual techniques;Patient/family education;Dry needling    PT Next Visit Plan  start exercises, try traction    Consulted and Agree with Plan of Care  Patient       Patient will benefit from skilled therapeutic intervention in order to improve the following deficits and impairments:  Decreased range of motion,  Increased muscle spasms, Pain, Improper body mechanics, Impaired flexibility, Decreased strength, Postural dysfunction  Visit Diagnosis: Cervicalgia - Plan: PT plan of care cert/re-cert  Cramp and spasm - Plan: PT plan of care cert/re-cert  Chronic bilateral low back pain without sciatica - Plan: PT plan of care cert/re-cert     Problem List Patient Active Problem List   Diagnosis Date Noted  . Polycythemia 05/12/2017  . Vitamin D deficiency 04/21/2017  . Bony  sclerosis 08/05/2016  . Lung nodule   . Chronic pain of both knees 12/05/2015  . Gastroesophageal reflux disease without esophagitis 05/17/2014  . Essential hypertension 05/17/2014  . Coronary artery calcification 08/17/2012  . Chronic abdominal pain 06/24/2012  . Right-sided chest wall pain 06/24/2012  . Constipation, chronic 06/24/2012  . Hyperbilirubinemia 06/24/2012  . Chronic back pain 06/24/2012  . DDD (degenerative disc disease), thoracolumbar 06/24/2012  . History of nephrolithiasis 06/24/2012  . Side pain 06/24/2012  . Low back pain at multiple sites 06/24/2012    Sumner Boast., PT 09/03/2017, 11:33 AM  [place], [place], [address] Phone: [phone]   Fax:  [phone]  Name: Aaron Dennis MRN: [id number] Date of Birth: 07-Dec-1955

## 2017-09-13 ENCOUNTER — Encounter: Payer: Self-pay | Admitting: Internal Medicine

## 2017-09-13 ENCOUNTER — Inpatient Hospital Stay: Payer: Self-pay | Attending: Internal Medicine | Admitting: Internal Medicine

## 2017-09-13 ENCOUNTER — Inpatient Hospital Stay: Payer: Self-pay

## 2017-09-13 ENCOUNTER — Telehealth: Payer: Self-pay | Admitting: Internal Medicine

## 2017-09-13 VITALS — BP 131/77 | HR 77 | Temp 98.0°F | Resp 18 | Ht 72.5 in | Wt 188.1 lb

## 2017-09-13 DIAGNOSIS — D751 Secondary polycythemia: Secondary | ICD-10-CM

## 2017-09-13 DIAGNOSIS — I10 Essential (primary) hypertension: Secondary | ICD-10-CM

## 2017-09-13 LAB — IRON AND TIBC
IRON: 61 ug/dL (ref 42–163)
Saturation Ratios: 21 % — ABNORMAL LOW (ref 42–163)
TIBC: 293 ug/dL (ref 202–409)
UIBC: 232 ug/dL

## 2017-09-13 LAB — CBC WITH DIFFERENTIAL (CANCER CENTER ONLY)
Basophils Absolute: 0.1 10*3/uL (ref 0.0–0.1)
Basophils Relative: 1 %
EOS ABS: 0.1 10*3/uL (ref 0.0–0.5)
Eosinophils Relative: 1 %
HEMATOCRIT: 51 % — AB (ref 38.4–49.9)
Hemoglobin: 17 g/dL (ref 13.0–17.1)
Lymphocytes Relative: 27 %
Lymphs Abs: 1.9 10*3/uL (ref 0.9–3.3)
MCH: 27.8 pg (ref 27.2–33.4)
MCHC: 33.3 g/dL (ref 32.0–36.0)
MCV: 83.3 fL (ref 79.3–98.0)
MONO ABS: 0.6 10*3/uL (ref 0.1–0.9)
Monocytes Relative: 8 %
Neutro Abs: 4.6 10*3/uL (ref 1.5–6.5)
Neutrophils Relative %: 63 %
Platelet Count: 193 10*3/uL (ref 140–400)
RBC: 6.12 MIL/uL — ABNORMAL HIGH (ref 4.20–5.82)
RDW: 14.2 % (ref 11.0–14.6)
WBC Count: 7.3 10*3/uL (ref 4.0–10.3)

## 2017-09-13 LAB — FERRITIN: Ferritin: 55 ng/mL (ref 24–336)

## 2017-09-13 NOTE — Telephone Encounter (Signed)
Gave patient avs and calendar of upcoming appts.  °

## 2017-09-13 NOTE — Progress Notes (Signed)
[facility] [phone]   [phone]  OFFICE PROGRESS NOTE  Charlott Rakes, MD [place] [address]  DIAGNOSIS: polycythemia as well as suspicious sclerotic lesions throughout the skeleton concerning for myeloproliferative disorder.  PRIOR THERAPY: None  CURRENT THERAPY: None  INTERVAL HISTORY: Aaron Dennis 62 y.o. male returns to the clinic today for follow-up visit.  The patient is feeling fine today with no specific complaints.  He denied having any chest pain, shortness of breath, cough or hemoptysis.  He denied having any fever or chills.  He has no nausea, vomiting, diarrhea or constipation.  He underwent bone marrow biopsy and aspirate that showed no concerning findings but early myeloproliferative disorder was not excluded.  He is here for evaluation and discussion of his biopsy results and recommendation regarding his condition.  MEDICAL HISTORY: Past Medical History:  Diagnosis Date  . GERD (gastroesophageal reflux disease)   . Hypertension   . Lung nodule    7 mm LUL nodule    ALLERGIES:  has No Known Allergies.  MEDICATIONS:  Current Outpatient Medications  Medication Sig Dispense Refill  . amLODipine (NORVASC) 5 MG tablet Take 1 tablet (5 mg total) by mouth daily. 90 tablet 1  . ergocalciferol (DRISDOL) 50000 units capsule Take 1 capsule (50,000 Units total) by mouth once a week. (Patient not taking: Reported on 08/24/2017) 9 capsule 1  . meclizine (ANTIVERT) 25 MG tablet Take 1 tablet (25 mg total) by mouth 3 (three) times daily as needed for dizziness. (Patient not taking: Reported on 08/24/2017) 30 tablet 0  . methocarbamol (ROBAXIN-750) 750 MG tablet Take 1 tablet (750 mg total) by mouth 2 (two) times daily as needed for muscle spasms. 180 tablet 1  . omeprazole (PRILOSEC) 40 MG capsule Take 1 capsule (40 mg total) by mouth daily. (Patient not taking: Reported on 08/24/2017) 90 capsule 1  .  triamcinolone ointment (KENALOG) 0.5 % Apply 1 application topically 2 (two) times daily. (Patient not taking: Reported on 07/12/2017) 30 g 0   Current Facility-Administered Medications  Medication Dose Route Frequency Provider Last Rate Last Dose  . 0.9 %  sodium chloride infusion  500 mL Intravenous Once Jackquline Denmark, MD        SURGICAL HISTORY: No past surgical history on file.  REVIEW OF SYSTEMS:  A comprehensive review of systems was negative.   PHYSICAL EXAMINATION: General appearance: alert, cooperative and no distress Head: Normocephalic, without obvious abnormality, atraumatic Neck: no adenopathy, no JVD, supple, symmetrical, trachea midline and thyroid not enlarged, symmetric, no tenderness/mass/nodules Lymph nodes: Cervical, supraclavicular, and axillary nodes normal. Resp: clear to auscultation bilaterally Back: symmetric, no curvature. ROM normal. No CVA tenderness. Cardio: regular rate and rhythm, S1, S2 normal, no murmur, click, rub or gallop GI: soft, non-tender; bowel sounds normal; no masses,  no organomegaly Extremities: extremities normal, atraumatic, no cyanosis or edema  ECOG PERFORMANCE STATUS: 1 - Symptomatic but completely ambulatory  Blood pressure 131/77, pulse 77, temperature 98 F (36.7 C), temperature source Oral, resp. rate 18, height 6' 0.5" (1.842 m), weight 188 lb 1.6 oz (85.3 kg), SpO2 100 %.  LABORATORY DATA: Lab Results  Component Value Date   WBC 7.6 07/12/2017   HGB 17.9 (H) 07/12/2017   HCT 51.7 07/12/2017   MCV 84.8 07/12/2017   PLT 213 07/12/2017      Chemistry      Component Value Date/Time   NA 140 07/12/2017 1443   NA  142 04/08/2017 0942   K 3.8 07/12/2017 1443   CL 105 07/12/2017 1443   CO2 24 07/12/2017 1443   BUN 11 07/12/2017 1443   BUN 12 04/08/2017 0942   CREATININE 0.97 07/12/2017 1443   CREATININE 0.92 04/28/2017 1231   CREATININE 1.02 12/05/2015 1109      Component Value Date/Time   CALCIUM 8.8 (L) 07/12/2017 1443    ALKPHOS 137 (H) 07/12/2017 1443   AST 17 07/12/2017 1443   AST 20 04/28/2017 1231   ALT 16 (L) 07/12/2017 1443   ALT 22 04/28/2017 1231   BILITOT 2.3 (H) 07/12/2017 1443   BILITOT 1.4 (H) 04/28/2017 1231       RADIOGRAPHIC STUDIES: No results found.  ASSESSMENT AND PLAN: This is a very pleasant 62 years old white male with history of polycythemia suspicious for myeloproliferative disorder.  His most recent bone marrow biopsy and aspirate showed no specific findings but early myeloproliferative disorder could not be excluded. I discussed the biopsy results with the patient today and recommended for him to have repeat CBC, Jak 2 mutation panel as well as serum iron and ferritin and serum erythropoietin level.  The patient  will come back for follow-up visit in 6 months for evaluation with repeat blood work. He was advised to call immediately if he has any concerning symptoms in the interval. The patient voices understanding of current disease status and treatment options and is in agreement with the current care plan.  All questions were answered. The patient knows to call the clinic with any problems, questions or concerns. We can certainly see the patient much sooner if necessary.  I spent 10 minutes counseling the patient face to face. The total time spent in the appointment was 15 minutes.  Disclaimer: This note was dictated with voice recognition software. Similar sounding words can inadvertently be transcribed and may not be corrected upon review.

## 2017-09-14 ENCOUNTER — Ambulatory Visit: Payer: Self-pay | Admitting: Physical Therapy

## 2017-09-14 LAB — ERYTHROPOIETIN: ERYTHROPOIETIN: 12.2 m[IU]/mL (ref 2.6–18.5)

## 2017-09-21 ENCOUNTER — Ambulatory Visit: Payer: Self-pay | Attending: Family Medicine | Admitting: Physical Therapy

## 2017-09-21 ENCOUNTER — Encounter: Payer: Self-pay | Admitting: Physical Therapy

## 2017-09-21 DIAGNOSIS — M545 Low back pain, unspecified: Secondary | ICD-10-CM

## 2017-09-21 DIAGNOSIS — R252 Cramp and spasm: Secondary | ICD-10-CM

## 2017-09-21 DIAGNOSIS — M542 Cervicalgia: Secondary | ICD-10-CM

## 2017-09-21 DIAGNOSIS — G8929 Other chronic pain: Secondary | ICD-10-CM

## 2017-09-21 NOTE — Therapy (Signed)
[place], [place], [address] Phone: [phone]   Fax:  [phone]  Physical Therapy Treatment  Patient Details  Name: Aaron Dennis MRN: [id number] Date of Birth: 1955-09-22 Referring Provider: Margarita Rana   Encounter Date: 09/21/2017  PT End of Session - 09/21/17 1119    Visit Number  2    Date for PT Re-Evaluation  11/04/17    PT Start Time  1050    PT Stop Time  1148    PT Time Calculation (min)  58 min    Activity Tolerance  Patient tolerated treatment well    Behavior During Therapy  [facility] for tasks assessed/performed       Past Medical History:  Diagnosis Date  . GERD (gastroesophageal reflux disease)   . Hypertension   . Lung nodule    7 mm LUL nodule    History reviewed. No pertinent surgical history.  There were no vitals filed for this visit.  Subjective Assessment - 09/21/17 1054    Subjective  Patient reports that he felt good when he left last visit, reports after sitting in car a long time he has stiffness and pain.  Reports doing exercises that I gave at evaluation    Currently in Pain?  Yes    Pain Score  3     Pain Location  Neck    Pain Descriptors / Indicators  Aching;Tightness    Aggravating Factors   sitting                       [facility] Adult PT Treatment/Exercise - 09/21/17 0001      Exercises   Exercises  Neck      Neck Exercises: Machines for Strengthening   UBE (Upper Arm Bike)  level 3 x 4 minutes    Cybex Row  20# 2x15    Lat Pull  20# 2x15      Neck Exercises: Theraband   Shoulder Extension  20 reps;Red    Rows  20 reps;Red    Shoulder External Rotation  20 reps;Red      Neck Exercises: Standing   Other Standing Exercises  5# shrugs with upper trap and levator stretches    Other Standing Exercises  W back, weighted ball overhead push      Neck Exercises: Supine   Neck Retraction  20 reps;3 secs    Neck Retraction Limitations  head  on ball      Modalities   Modalities  Traction      Moist Heat Therapy   Number Minutes Moist Heat  15 Minutes    Moist Heat Location  Cervical;Lumbar Spine      Electrical Stimulation   Electrical Stimulation Location  C/T area    Electrical Stimulation Action  IFC    Electrical Stimulation Parameters  supine    Electrical Stimulation Goals  Pain      Traction   Type of Traction  Cervical    Min (lbs)  14    Hold Time  static    Time  10               PT Short Term Goals - 09/21/17 1121      PT SHORT TERM GOAL #1   Title  independent with initial HEP    Status  Achieved        PT Long Term Goals - 09/03/17 1131  PT LONG TERM GOAL #1   Title  understand proper posture and body mechanics    Time  8    Period  Weeks    Status  New      PT LONG TERM GOAL #2   Title  decrease pain 50%    Time  8    Period  Weeks    Status  New      PT LONG TERM GOAL #3   Title  increase cervical ROM 25%    Time  8    Period  Weeks    Status  New      PT LONG TERM GOAL #4   Title  report little issue with backing up the car    Time  8    Period  Weeks    Status  New            Plan - 09/21/17 1119    Clinical Impression Statement  Patient requires a lot of cues for exercises, to slow down, for form and to have good posture.  Has difficulty with many of the motions.  Reports that the traction felt good    PT Next Visit Plan  continue with execises, see if traction had good carryover results    Consulted and Agree with Plan of Care  Patient       Patient will benefit from skilled therapeutic intervention in order to improve the following deficits and impairments:  Decreased range of motion, Increased muscle spasms, Pain, Improper body mechanics, Impaired flexibility, Decreased strength, Postural dysfunction  Visit Diagnosis: Cervicalgia  Cramp and spasm  Chronic bilateral low back pain without sciatica     Problem List Patient Active Problem  List   Diagnosis Date Noted  . Polycythemia 05/12/2017  . Vitamin D deficiency 04/21/2017  . Bony sclerosis 08/05/2016  . Lung nodule   . Chronic pain of both knees 12/05/2015  . Gastroesophageal reflux disease without esophagitis 05/17/2014  . Essential hypertension 05/17/2014  . Coronary artery calcification 08/17/2012  . Chronic abdominal pain 06/24/2012  . Right-sided chest wall pain 06/24/2012  . Constipation, chronic 06/24/2012  . Hyperbilirubinemia 06/24/2012  . Chronic back pain 06/24/2012  . DDD (degenerative disc disease), thoracolumbar 06/24/2012  . History of nephrolithiasis 06/24/2012  . Side pain 06/24/2012  . Low back pain at multiple sites 06/24/2012    Sumner Boast., PT 09/21/2017, 11:21 AM  [place], [place], [address] Phone: [phone]   Fax:  [phone]  Name: Aaron Dennis MRN: [id number] Date of Birth: 1955/02/23

## 2017-09-22 LAB — JAK2 (INCLUDING V617F AND EXON 12), MPL,& CALR W/RFL MPN PANEL (NGS)

## 2017-09-23 ENCOUNTER — Ambulatory Visit: Payer: Self-pay | Admitting: Physical Therapy

## 2017-09-23 ENCOUNTER — Encounter: Payer: Self-pay | Admitting: Physical Therapy

## 2017-09-23 DIAGNOSIS — G8929 Other chronic pain: Secondary | ICD-10-CM

## 2017-09-23 DIAGNOSIS — M542 Cervicalgia: Secondary | ICD-10-CM

## 2017-09-23 DIAGNOSIS — R252 Cramp and spasm: Secondary | ICD-10-CM

## 2017-09-23 DIAGNOSIS — M545 Low back pain, unspecified: Secondary | ICD-10-CM

## 2017-09-23 NOTE — Therapy (Signed)
[place], [place], [address] Phone: [phone]   Fax:  [phone]  Physical Therapy Treatment  Patient Details  Name: Aaron Dennis MRN: [id number] Date of Birth: 1955/03/15 Referring Provider: Margarita Rana   Encounter Date: 09/23/2017  PT End of Session - 09/23/17 1120    Visit Number  3    Date for PT Re-Evaluation  11/04/17    PT Start Time  1105    PT Stop Time  1155    PT Time Calculation (min)  50 min       Past Medical History:  Diagnosis Date  . GERD (gastroesophageal reflux disease)   . Hypertension   . Lung nodule    7 mm LUL nodule    History reviewed. No pertinent surgical history.  There were no vitals filed for this visit.  Subjective Assessment - 09/23/17 1105    Subjective  tx last time helped, back is bad today esp on RT    Currently in Pain?  Yes    Pain Score  7     Pain Location  Back                       [facility] Adult PT Treatment/Exercise - 09/23/17 0001      Exercises   Exercises  Neck      Neck Exercises: Machines for Strengthening   UBE (Upper Arm Bike)  level 3 x 4 minutes    Cybex Row  20# 2x15    Lat Pull  20# 2x15      Neck Exercises: Theraband   Shoulder Extension  20 reps;Red    Rows  20 reps;Red    Shoulder External Rotation  20 reps;Red      Neck Exercises: Standing   Neck Retraction  10 reps;3 secs   head on ball on wall   Wall Push Ups  10 reps   CC and CCW     Moist Heat Therapy   Number Minutes Moist Heat  15 Minutes    Moist Heat Location  Cervical;Lumbar Spine      Electrical Stimulation   Electrical Stimulation Location  cerv/lumbar    Electrical Stimulation Action  premod    Electrical Stimulation Parameters  supine    Electrical Stimulation Goals  Pain      Traction   Type of Traction  Cervical    Min (lbs)  14    Hold Time  static    Time  10               PT Short Term Goals - 09/21/17 1121       PT SHORT TERM GOAL #1   Title  independent with initial HEP    Status  Achieved        PT Long Term Goals - 09/03/17 1131      PT LONG TERM GOAL #1   Title  understand proper posture and body mechanics    Time  8    Period  Weeks    Status  New      PT LONG TERM GOAL #2   Title  decrease pain 50%    Time  8    Period  Weeks    Status  New      PT LONG TERM GOAL #3   Title  increase cervical ROM 25%    Time  8    Period  Weeks    Status  New      PT LONG TERM GOAL #4   Title  report little issue with backing up the car    Time  8    Period  Weeks    Status  New            Plan - 09/23/17 1120    Clinical Impression Statement  pt needed constant verb and tactile cuing for posture and correct execution of all ex. pt verb relief with modalities and added stim to LB. no goals met as only 3rd visit    PT Treatment/Interventions  ADLs/Self Care Home Management;Electrical Stimulation;Moist Heat;Traction;Therapeutic activities;Therapeutic exercise;Manual techniques;Patient/family education;Dry needling    PT Next Visit Plan  postural ther ex       Patient will benefit from skilled therapeutic intervention in order to improve the following deficits and impairments:  Decreased range of motion, Increased muscle spasms, Pain, Improper body mechanics, Impaired flexibility, Decreased strength, Postural dysfunction  Visit Diagnosis: Cervicalgia  Cramp and spasm  Chronic bilateral low back pain without sciatica     Problem List Patient Active Problem List   Diagnosis Date Noted  . Polycythemia 05/12/2017  . Vitamin D deficiency 04/21/2017  . Bony sclerosis 08/05/2016  . Lung nodule   . Chronic pain of both knees 12/05/2015  . Gastroesophageal reflux disease without esophagitis 05/17/2014  . Essential hypertension 05/17/2014  . Coronary artery calcification 08/17/2012  . Chronic abdominal pain 06/24/2012  . Right-sided chest wall pain 06/24/2012  .  Constipation, chronic 06/24/2012  . Hyperbilirubinemia 06/24/2012  . Chronic back pain 06/24/2012  . DDD (degenerative disc disease), thoracolumbar 06/24/2012  . History of nephrolithiasis 06/24/2012  . Side pain 06/24/2012  . Low back pain at multiple sites 06/24/2012    Regnia Mathwig,ANGIE PTA 09/23/2017, 11:26 AM  [place], [place], [address] Phone: [phone]   Fax:  [phone]  Name: Aaron Dennis MRN: [id number] Date of Birth: 1955/07/17

## 2017-09-28 ENCOUNTER — Encounter: Payer: Self-pay | Admitting: Physical Therapy

## 2017-09-28 ENCOUNTER — Ambulatory Visit: Payer: Self-pay | Admitting: Physical Therapy

## 2017-09-28 DIAGNOSIS — M545 Low back pain, unspecified: Secondary | ICD-10-CM

## 2017-09-28 DIAGNOSIS — R252 Cramp and spasm: Secondary | ICD-10-CM

## 2017-09-28 DIAGNOSIS — M542 Cervicalgia: Secondary | ICD-10-CM

## 2017-09-28 DIAGNOSIS — G8929 Other chronic pain: Secondary | ICD-10-CM

## 2017-09-28 NOTE — Therapy (Signed)
[place], [place], [address] Phone: [phone]   Fax:  [phone]  Physical Therapy Treatment  Patient Details  Name: Aaron Dennis MRN: [id number] Date of Birth: [birth year] Referring Provider: Margarita Rana   Encounter Date: 09/28/2017  PT End of Session - 09/28/17 1142    Visit Number  4    Date for PT Re-Evaluation  11/04/17    PT Start Time  1100    PT Stop Time  1157    PT Time Calculation (min)  57 min    Activity Tolerance  Patient tolerated treatment well    Behavior During Therapy  [facility] for tasks assessed/performed       Past Medical History:  Diagnosis Date  . GERD (gastroesophageal reflux disease)   . Hypertension   . Lung nodule    7 mm LUL nodule    History reviewed. No pertinent surgical history.  There were no vitals filed for this visit.  Subjective Assessment - 09/28/17 1103    Subjective  Pt reports that he is doing good but when he looks down he has pain between his shoulder blades. 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  Yes    Pain Score  7     Pain Location  Back    Pain Orientation  Right                       [facility] Adult PT Treatment/Exercise - 09/28/17 0001      Exercises   Exercises  Neck      Neck Exercises: Machines for Strengthening   UBE (Upper Arm Bike)  level 3 x 6 minutes    Cybex Row  25# 2x15    Lat Pull  25# 2x15      Neck Exercises: Theraband   Shoulder Extension  20 reps;Red    Rows  20 reps;Red    Shoulder External Rotation  20 reps;Red    Horizontal ADduction  20 reps;Red      Modalities   Modalities  Traction;Electrical Stimulation;Moist Heat      Moist Heat Therapy   Number Minutes Moist Heat  15 Minutes    Moist Heat Location  Cervical;Lumbar Spine      Electrical Stimulation   Electrical Stimulation Location  R lumbar, mid T    Electrical Stimulation Action   premod    Electrical Stimulation Parameters  supine    Electrical Stimulation Goals  Pain      Traction   Type of Traction  Cervical    Min (lbs)  14    Hold Time  static    Time  10      Manual Therapy   Manual Therapy  Passive ROM;Manual Traction;Soft tissue mobilization    Soft tissue mobilization  cervival para spinales    Passive ROM  cervical spine    Manual Traction  cervical spine2x15''               PT Short Term Goals - 09/21/17 1121      PT SHORT TERM GOAL #1   Title  independent with initial HEP    Status  Achieved        PT Long Term Goals - 09/03/17 1131      PT LONG TERM GOAL #1   Title  understand proper posture and body mechanics  Time  8    Period  Weeks    Status  New      PT LONG TERM GOAL #2   Title  decrease pain 50%    Time  8    Period  Weeks    Status  New      PT LONG TERM GOAL #3   Title  increase cervical ROM 25%    Time  8    Period  Weeks    Status  New      PT LONG TERM GOAL #4   Title  report little issue with backing up the car    Time  8    Period  Weeks    Status  New            Plan - 09/28/17 1145    Clinical Impression Statement  Patient required constant cues for posture and proper exercise execution. He reports constant pain between his shoulder blades. No soft tissues limitation noted during MT. Positive response with modalities.    Rehab Potential  Good    PT Frequency  2x / week    PT Duration  8 weeks    PT Next Visit Plan  postural ther ex       Patient will benefit from skilled therapeutic intervention in order to improve the following deficits and impairments:  Decreased range of motion, Increased muscle spasms, Pain, Improper body mechanics, Impaired flexibility, Decreased strength, Postural dysfunction  Visit Diagnosis: Cervicalgia  Cramp and spasm  Chronic bilateral low back pain without sciatica     Problem List Patient Active Problem List   Diagnosis Date Noted  .  Polycythemia 05/12/2017  . Vitamin D deficiency 04/21/2017  . Bony sclerosis 08/05/2016  . Lung nodule   . Chronic pain of both knees 12/05/2015  . Gastroesophageal reflux disease without esophagitis 05/17/2014  . Essential hypertension 05/17/2014  . Coronary artery calcification 08/17/2012  . Chronic abdominal pain 06/24/2012  . Right-sided chest wall pain 06/24/2012  . Constipation, chronic 06/24/2012  . Hyperbilirubinemia 06/24/2012  . Chronic back pain 06/24/2012  . DDD (degenerative disc disease), thoracolumbar 06/24/2012  . History of nephrolithiasis 06/24/2012  . Side pain 06/24/2012  . Low back pain at multiple sites 06/24/2012    Scot Jun, PTA 09/28/2017, 11:51 AM  [place], [place], [address] Phone: [phone]   Fax:  [phone]  Name: JHACE FENNELL MRN: [id number] Date of Birth: Mar 07, 1955

## 2017-09-30 ENCOUNTER — Ambulatory Visit: Payer: Self-pay | Admitting: Physical Therapy

## 2017-10-05 ENCOUNTER — Ambulatory Visit: Payer: Self-pay | Admitting: Physical Therapy

## 2017-10-05 ENCOUNTER — Encounter: Payer: Self-pay | Admitting: Physical Therapy

## 2017-10-05 DIAGNOSIS — G8929 Other chronic pain: Secondary | ICD-10-CM

## 2017-10-05 DIAGNOSIS — M545 Low back pain, unspecified: Secondary | ICD-10-CM

## 2017-10-05 DIAGNOSIS — R252 Cramp and spasm: Secondary | ICD-10-CM

## 2017-10-05 DIAGNOSIS — M542 Cervicalgia: Secondary | ICD-10-CM

## 2017-10-05 NOTE — Therapy (Signed)
[place], [place], [address] Phone: [phone]   Fax:  [phone]  Physical Therapy Treatment  Patient Details  Name: Aaron Dennis MRN: [id number] Date of Birth: [birth year] Referring Provider: Margarita Rana   Encounter Date: 10/05/2017  PT End of Session - 10/05/17 1154    Visit Number  5    Date for PT Re-Evaluation  11/04/17    PT Start Time  1105    PT Stop Time  1150    PT Time Calculation (min)  45 min    Activity Tolerance  Patient limited by pain    Behavior During Therapy  [facility] for tasks assessed/performed       Past Medical History:  Diagnosis Date  . GERD (gastroesophageal reflux disease)   . Hypertension   . Lung nodule    7 mm LUL nodule    History reviewed. No pertinent surgical history.  There were no vitals filed for this visit.  Subjective Assessment - 10/05/17 1152    Subjective  Patient comes in and reports that he is having left shoulder and arm pain, he got a vaccination last week in the left deltoid    Currently in Pain?  Yes    Pain Score  6     Pain Location  Back   neck   Aggravating Factors   sitting, lifting         [facility] PT Assessment - 10/05/17 0001      Palpation   Palpation comment  he has a large reddened area in the left deltoid down to just above the elbow, the tissue is hard and has a pretty good temperature, reports pain with left shoulder motions                   [facility] Adult PT Treatment/Exercise - 10/05/17 0001      Moist Heat Therapy   Number Minutes Moist Heat  15 Minutes    Moist Heat Location  Cervical;Lumbar Spine      Electrical Stimulation   Electrical Stimulation Location  R lumbar, mid T    Electrical Stimulation Action  premod    Electrical Stimulation Parameters  supine    Electrical Stimulation Goals  Pain      Traction   Type of Traction  Cervical    Min (lbs)  14    Hold Time  static    Time  10      Manual Therapy   Manual Therapy  Passive ROM;Manual Traction;Soft tissue mobilization    Soft tissue mobilization  cervival para spinales    Passive ROM  cervical spine    Manual Traction  cervical spine2x15''               PT Short Term Goals - 09/21/17 1121      PT SHORT TERM GOAL #1   Title  independent with initial HEP    Status  Achieved        PT Long Term Goals - 10/05/17 1157      PT LONG TERM GOAL #1   Title  understand proper posture and body mechanics    Status  Partially Met      PT LONG TERM GOAL #2   Title  decrease pain 50%    Status  Partially Met            Plan - 10/05/17 1155    Clinical Impression Statement  Patient  was limited by left upper arm and shoulder pain, he reports getting a vaccination in the arm last week.  It is red, swollen, warm and tender to the touch.  Reports pain with motions so we did not perform any exercises    PT Next Visit Plan  see if we can do more exercises    Consulted and Agree with Plan of Care  Patient       Patient will benefit from skilled therapeutic intervention in order to improve the following deficits and impairments:  Decreased range of motion, Increased muscle spasms, Pain, Improper body mechanics, Impaired flexibility, Decreased strength, Postural dysfunction  Visit Diagnosis: Cervicalgia  Cramp and spasm  Chronic bilateral low back pain without sciatica     Problem List Patient Active Problem List   Diagnosis Date Noted  . Polycythemia 05/12/2017  . Vitamin D deficiency 04/21/2017  . Bony sclerosis 08/05/2016  . Lung nodule   . Chronic pain of both knees 12/05/2015  . Gastroesophageal reflux disease without esophagitis 05/17/2014  . Essential hypertension 05/17/2014  . Coronary artery calcification 08/17/2012  . Chronic abdominal pain 06/24/2012  . Right-sided chest wall pain 06/24/2012  . Constipation, chronic 06/24/2012  . Hyperbilirubinemia 06/24/2012  . Chronic back pain  06/24/2012  . DDD (degenerative disc disease), thoracolumbar 06/24/2012  . History of nephrolithiasis 06/24/2012  . Side pain 06/24/2012  . Low back pain at multiple sites 06/24/2012    Sumner Boast., PT 10/05/2017, 11:58 AM  [place], [place], [address] Phone: [phone]   Fax:  [phone]  Name: Aaron Dennis MRN: [id number] Date of Birth: [birth year]

## 2017-10-06 MED FILL — OMEPRAZOLE DR 40 MG CAPSULE: 40 | 30 days supply | Qty: 30 | Fill #2

## 2017-10-07 ENCOUNTER — Ambulatory Visit: Payer: Self-pay | Admitting: Physical Therapy

## 2017-10-14 ENCOUNTER — Ambulatory Visit: Payer: Self-pay | Attending: Family Medicine | Admitting: Physical Therapy

## 2017-10-14 ENCOUNTER — Encounter: Payer: Self-pay | Admitting: Physical Therapy

## 2017-10-14 DIAGNOSIS — M545 Low back pain, unspecified: Secondary | ICD-10-CM

## 2017-10-14 DIAGNOSIS — M542 Cervicalgia: Secondary | ICD-10-CM | POA: Insufficient documentation

## 2017-10-14 DIAGNOSIS — G8929 Other chronic pain: Secondary | ICD-10-CM | POA: Insufficient documentation

## 2017-10-14 DIAGNOSIS — R252 Cramp and spasm: Secondary | ICD-10-CM | POA: Insufficient documentation

## 2017-10-14 NOTE — Therapy (Signed)
[place], [place], [address] Phone: [phone]   Fax:  [phone]  Physical Therapy Treatment  Patient Details  Name: NICHOLOS ALOISI MRN: [id number] Date of Birth: 08-05-1955 Referring Provider: Margarita Rana   Encounter Date: 10/14/2017  PT End of Session - 10/14/17 1146    Visit Number  6    Date for PT Re-Evaluation  11/04/17    PT Start Time  1100    PT Stop Time  1158    PT Time Calculation (min)  58 min    Activity Tolerance  Patient tolerated treatment well    Behavior During Therapy  [facility] for tasks assessed/performed       Past Medical History:  Diagnosis Date  . GERD (gastroesophageal reflux disease)   . Hypertension   . Lung nodule    7 mm LUL nodule    History reviewed. No pertinent surgical history.  There were no vitals filed for this visit.  Subjective Assessment - 10/14/17 1103    Subjective  Patient still has some redness in the left upper arm from the vaccination, he is reporting neck is feeling better but still c/o right sided back pain    Currently in Pain?  Yes    Pain Score  6     Pain Location  Back    Pain Orientation  Right    Pain Descriptors / Indicators  Tightness;Spasm    Aggravating Factors   lifting                       [facility] Adult PT Treatment/Exercise - 10/14/17 0001      Exercises   Exercises  Lumbar      Neck Exercises: Machines for Strengthening   UBE (Upper Arm Bike)  level 3 x 6 minutes    Cybex Row  25# 2x15    Lat Pull  25# 2x15      Lumbar Exercises: Stretches   Passive Hamstring Stretch  4 reps;30 seconds    Piriformis Stretch  4 reps;20 seconds      Lumbar Exercises: Machines for Strengthening   Cybex Lumbar Extension  black tband 20 reps    Cybex Knee Extension  5# 2x10    Cybex Knee Flexion  20# 2x10      Lumbar Exercises: Supine   Other Supine Lumbar Exercises  feet on ball K2C, trunk rotation, small bridges and  isometric abdominals      Moist Heat Therapy   Number Minutes Moist Heat  15 Minutes    Moist Heat Location  Cervical;Lumbar Spine      Electrical Stimulation   Electrical Stimulation Location  R lumbar, mid T    Electrical Stimulation Action  IFC    Electrical Stimulation Parameters  supine    Electrical Stimulation Goals  Pain      Manual Therapy   Soft tissue mobilization  to the thoracic and lumbar paraspinals and into the right buttock, some use of vibration    Passive ROM  passive stretches to the LE's               PT Short Term Goals - 09/21/17 1121      PT SHORT TERM GOAL #1   Title  independent with initial HEP    Status  Achieved        PT Long Term Goals - 10/14/17 1148      PT LONG TERM  GOAL #1   Title  understand proper posture and body mechanics    Status  Achieved      PT LONG TERM GOAL #4   Title  report little issue with backing up the car    Status  Partially Met            Plan - 10/14/17 1147    Clinical Impression Statement  Patient did well returning to exercises still some left arm issues with the vaccination.  He has significant mm spasm in the paraspinals    PT Next Visit Plan  continue to progress as tolerated    Consulted and Agree with Plan of Care  Patient       Patient will benefit from skilled therapeutic intervention in order to improve the following deficits and impairments:  Decreased range of motion, Increased muscle spasms, Pain, Improper body mechanics, Impaired flexibility, Decreased strength, Postural dysfunction  Visit Diagnosis: Cervicalgia  Cramp and spasm  Chronic bilateral low back pain without sciatica     Problem List Patient Active Problem List   Diagnosis Date Noted  . Polycythemia 05/12/2017  . Vitamin D deficiency 04/21/2017  . Bony sclerosis 08/05/2016  . Lung nodule   . Chronic pain of both knees 12/05/2015  . Gastroesophageal reflux disease without esophagitis 05/17/2014  . Essential  hypertension 05/17/2014  . Coronary artery calcification 08/17/2012  . Chronic abdominal pain 06/24/2012  . Right-sided chest wall pain 06/24/2012  . Constipation, chronic 06/24/2012  . Hyperbilirubinemia 06/24/2012  . Chronic back pain 06/24/2012  . DDD (degenerative disc disease), thoracolumbar 06/24/2012  . History of nephrolithiasis 06/24/2012  . Side pain 06/24/2012  . Low back pain at multiple sites 06/24/2012    Sumner Boast., PT 10/14/2017, 11:49 AM  [place], [place], [address] Phone: [phone]   Fax:  [phone]  Name: LORAS GRIESHOP MRN: [id number] Date of Birth: 1956-01-13

## 2017-10-19 ENCOUNTER — Encounter: Payer: Self-pay | Admitting: Physical Therapy

## 2017-10-19 ENCOUNTER — Ambulatory Visit: Payer: Self-pay | Admitting: Physical Therapy

## 2017-10-19 DIAGNOSIS — R252 Cramp and spasm: Secondary | ICD-10-CM

## 2017-10-19 DIAGNOSIS — M542 Cervicalgia: Secondary | ICD-10-CM

## 2017-10-19 NOTE — Therapy (Signed)
[place], [place], [address] Phone: [phone]   Fax:  [phone]  Physical Therapy Treatment  Patient Details  Name: Aaron Dennis MRN: [id number] Date of Birth: [birth year] Referring Provider: Margarita Rana   Encounter Date: 10/19/2017  PT End of Session - 10/19/17 1138    Visit Number  7    Date for PT Re-Evaluation  11/04/17    PT Start Time  1058    PT Stop Time  1154    PT Time Calculation (min)  56 min    Activity Tolerance  Patient tolerated treatment well    Behavior During Therapy  [facility] for tasks assessed/performed       Past Medical History:  Diagnosis Date  . GERD (gastroesophageal reflux disease)   . Hypertension   . Lung nodule    7 mm LUL nodule    History reviewed. No pertinent surgical history.  There were no vitals filed for this visit.  Subjective Assessment - 10/19/17 1058    Subjective  Pt reports pain in lumbar spine on the R side, feels it when he has been sitting and then getting up out of his car.    Currently in Pain?  Yes    Pain Score  5     Pain Location  Back    Pain Orientation  Right         [facility] PT Assessment - 10/19/17 0001      AROM   Overall AROM Comments  cervical ROM decreased 50% for all motions                    [facility] Adult PT Treatment/Exercise - 10/19/17 0001      Exercises   Exercises  Lumbar      Neck Exercises: Machines for Strengthening   UBE (Upper Arm Bike)  level 3 x 6 minutes    Cybex Row  25# 2x15    Lat Pull  25# 2x15      Lumbar Exercises: Machines for Strengthening   Cybex Lumbar Extension  black tband 20 reps    Cybex Knee Extension  5# 2x10    Cybex Knee Flexion  20# 2x10      Moist Heat Therapy   Number Minutes Moist Heat  15 Minutes    Moist Heat Location  Cervical;Lumbar Spine      Electrical Stimulation   Electrical Stimulation Location  R lumbar, mid T    Electrical Stimulation Action  IFC    Electrical Stimulation Parameters  supine    Electrical Stimulation Goals  Pain      Manual Therapy   Soft tissue mobilization  to the thoracic and lumbar paraspinals and into the right buttock, some use of vibration    Passive ROM  passive stretches to the LE's               PT Short Term Goals - 09/21/17 1121      PT SHORT TERM GOAL #1   Title  independent with initial HEP    Status  Achieved        PT Long Term Goals - 10/14/17 1148      PT LONG TERM GOAL #1   Title  understand proper posture and body mechanics    Status  Achieved      PT LONG TERM GOAL #4   Title  report little issue with backing up the car  Status  Partially Met            Plan - 10/19/17 1139    Clinical Impression Statement  Some improvement with cervical ROM. Good carryover from last treatment with exercise tolerance. Reports pain in posterior R flank when standing from sitting long periods of time in his car. 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     Rehab Potential  Good    PT Frequency  2x / week    PT Duration  8 weeks    PT Treatment/Interventions  ADLs/Self Care Home Management;Electrical Stimulation;Moist Heat;Traction;Therapeutic activities;Therapeutic exercise;Manual techniques;Patient/family education;Dry needling    PT Next Visit Plan  continue to progress as tolerated       Patient will benefit from skilled therapeutic intervention in order to improve the following deficits and impairments:  Decreased range of motion, Increased muscle spasms, Pain, Improper body mechanics, Impaired flexibility, Decreased strength, Postural dysfunction  Visit Diagnosis: Cervicalgia  Cramp and spasm     Problem List Patient Active Problem List   Diagnosis Date Noted  . Polycythemia 05/12/2017  . Vitamin D deficiency 04/21/2017  . Bony sclerosis 08/05/2016  . Lung nodule   . Chronic pain of both knees 12/05/2015  . Gastroesophageal reflux disease without esophagitis  05/17/2014  . Essential hypertension 05/17/2014  . Coronary artery calcification 08/17/2012  . Chronic abdominal pain 06/24/2012  . Right-sided chest wall pain 06/24/2012  . Constipation, chronic 06/24/2012  . Hyperbilirubinemia 06/24/2012  . Chronic back pain 06/24/2012  . DDD (degenerative disc disease), thoracolumbar 06/24/2012  . History of nephrolithiasis 06/24/2012  . Side pain 06/24/2012  . Low back pain at multiple sites 06/24/2012    Scot Jun, PTA 10/19/2017, 11:42 AM  [place], [place], [address] Phone: [phone]   Fax:  [phone]  Name: Aaron Dennis MRN: [id number] Date of Birth: 23-Jun-1955

## 2017-10-21 ENCOUNTER — Ambulatory Visit: Payer: Self-pay | Admitting: Physical Therapy

## 2017-10-21 DIAGNOSIS — G8929 Other chronic pain: Secondary | ICD-10-CM

## 2017-10-21 DIAGNOSIS — M545 Low back pain, unspecified: Secondary | ICD-10-CM

## 2017-10-21 DIAGNOSIS — R252 Cramp and spasm: Secondary | ICD-10-CM

## 2017-10-21 DIAGNOSIS — M542 Cervicalgia: Secondary | ICD-10-CM

## 2017-10-21 NOTE — Therapy (Signed)
[place], [place], [address] Phone: [phone]   Fax:  [phone]  Physical Therapy Treatment  Patient Details  Name: Aaron Dennis MRN: [id number] Date of Birth: 07/03/1955 Referring Provider: Margarita Rana   Encounter Date: 10/21/2017  PT End of Session - 10/21/17 1143    Visit Number  8    Date for PT Re-Evaluation  11/04/17    PT Start Time  1100    PT Stop Time  1158    PT Time Calculation (min)  58 min       Past Medical History:  Diagnosis Date  . GERD (gastroesophageal reflux disease)   . Hypertension   . Lung nodule    7 mm LUL nodule    No past surgical history on file.  There were no vitals filed for this visit.  Subjective Assessment - 10/21/17 1114    Subjective  Pt stated that he is a little better but has a new problem. PT reports R foot pain when laying down at night trying to sleep    Currently in Pain?  No/denies    Pain Score  0-No pain                       [facility] Adult PT Treatment/Exercise - 10/21/17 0001      Exercises   Exercises  Lumbar      Neck Exercises: Machines for Strengthening   UBE (Upper Arm Bike)  L2 x 4 min     Cybex Row  25# 2x15    Lat Pull  25# 2x15      Lumbar Exercises: Stretches   Single Knee to Chest Stretch  Left;Right;4 reps;10 seconds    Piriformis Stretch  4 reps;20 seconds      Lumbar Exercises: Aerobic   Recumbent Bike  L0 x4 min      Lumbar Exercises: Machines for Strengthening   Cybex Lumbar Extension  black tband 20 reps    Cybex Knee Flexion  25# 2x10    Leg Press  30lb 2x10       Electrical Stimulation   Electrical Stimulation Location  R lumbar, mid T    Electrical Stimulation Action  IFC    Electrical Stimulation Parameters  supine    Electrical Stimulation Goals  Pain      Manual Therapy   Soft tissue mobilization  to the thoracic and lumbar paraspinals and into the right buttock, some use of  vibration    Passive ROM  passive stretches to the LE's               PT Short Term Goals - 09/21/17 1121      PT SHORT TERM GOAL #1   Title  independent with initial HEP    Status  Achieved        PT Long Term Goals - 10/14/17 1148      PT LONG TERM GOAL #1   Title  understand proper posture and body mechanics    Status  Achieved      PT LONG TERM GOAL #4   Title  report little issue with backing up the car    Status  Partially Met            Plan - 10/21/17 1144    Clinical Impression Statement  Pt reports that he feel better but is having a new issues with food pain at night when  he lays down. Pt did well with today's exercises added leg press without issue. Tenderness in his low back on R side noted with palpation.    Rehab Potential  Good    PT Frequency  2x / week    PT Duration  8 weeks    PT Treatment/Interventions  ADLs/Self Care Home Management;Electrical Stimulation;Moist Heat;Traction;Therapeutic activities;Therapeutic exercise;Manual techniques;Patient/family education;Dry needling    PT Next Visit Plan  continue to progress as tolerated       Patient will benefit from skilled therapeutic intervention in order to improve the following deficits and impairments:  Decreased range of motion, Increased muscle spasms, Pain, Improper body mechanics, Impaired flexibility, Decreased strength, Postural dysfunction  Visit Diagnosis: Cervicalgia  Cramp and spasm  Chronic bilateral low back pain without sciatica     Problem List Patient Active Problem List   Diagnosis Date Noted  . Polycythemia 05/12/2017  . Vitamin D deficiency 04/21/2017  . Bony sclerosis 08/05/2016  . Lung nodule   . Chronic pain of both knees 12/05/2015  . Gastroesophageal reflux disease without esophagitis 05/17/2014  . Essential hypertension 05/17/2014  . Coronary artery calcification 08/17/2012  . Chronic abdominal pain 06/24/2012  . Right-sided chest wall pain  06/24/2012  . Constipation, chronic 06/24/2012  . Hyperbilirubinemia 06/24/2012  . Chronic back pain 06/24/2012  . DDD (degenerative disc disease), thoracolumbar 06/24/2012  . History of nephrolithiasis 06/24/2012  . Side pain 06/24/2012  . Low back pain at multiple sites 06/24/2012    Scot Jun, PTA 10/21/2017, 11:47 AM  [place], [place], [address] Phone: [phone]   Fax:  [phone]  Name: Aaron Dennis MRN: [id number] Date of Birth: 02/16/1955

## 2017-10-26 ENCOUNTER — Encounter: Payer: Self-pay | Admitting: Physical Therapy

## 2017-10-26 ENCOUNTER — Ambulatory Visit: Payer: Self-pay | Admitting: Physical Therapy

## 2017-10-26 DIAGNOSIS — G8929 Other chronic pain: Secondary | ICD-10-CM

## 2017-10-26 DIAGNOSIS — M545 Low back pain, unspecified: Secondary | ICD-10-CM

## 2017-10-26 DIAGNOSIS — M542 Cervicalgia: Secondary | ICD-10-CM

## 2017-10-26 DIAGNOSIS — R252 Cramp and spasm: Secondary | ICD-10-CM

## 2017-10-26 NOTE — Therapy (Signed)
[place], [place], [address] Phone: [phone]   Fax:  [phone]  Physical Therapy Treatment  Patient Details  Name: Aaron Dennis MRN: [id number] Date of Birth: 11-Sep-1955 Referring Provider: Margarita Rana   Encounter Date: 10/26/2017  PT End of Session - 10/26/17 1140    Visit Number  9    Date for PT Re-Evaluation  11/04/17    PT Start Time  1100    PT Stop Time  1150    PT Time Calculation (min)  50 min    Activity Tolerance  Patient tolerated treatment well    Behavior During Therapy  [facility] for tasks assessed/performed       Past Medical History:  Diagnosis Date  . GERD (gastroesophageal reflux disease)   . Hypertension   . Lung nodule    7 mm LUL nodule    History reviewed. No pertinent surgical history.  There were no vitals filed for this visit.  Subjective Assessment - 10/26/17 1104    Subjective  Pt reports a popin sensation around L scapula that he thinks come from driving. Pt is a taxi driver    Currently in Pain?  No/denies    Pain Score  0-No pain                       [facility] Adult PT Treatment/Exercise - 10/26/17 0001      Exercises   Exercises  Lumbar      Neck Exercises: Machines for Strengthening   UBE (Upper Arm Bike)  L2 x 6 min     Cybex Row  35# 2x10    Lat Pull  35# 2x10      Neck Exercises: Standing   Wall Push Ups  10 reps    Other Standing Exercises  shoulder ext green 2x10     Other Standing Exercises  weighted ball overhead ext 2x10       Lumbar Exercises: Stretches   Single Knee to Chest Stretch  Left;Right;4 reps;10 seconds    Piriformis Stretch  4 reps;20 seconds    Other Lumbar Stretch Exercise  lowe trunk rotaions 4 X 10 sec      Lumbar Exercises: Machines for Strengthening   Leg Press  30lb 2x10       Moist Heat Therapy   Number Minutes Moist Heat  15 Minutes    Moist Heat Location  Lumbar Spine      Electrical Stimulation    Electrical Stimulation Location  R lumbar, mid T    Electrical Stimulation Action  IFC    Electrical Stimulation Parameters  supine    Electrical Stimulation Goals  Pain               PT Short Term Goals - 09/21/17 1121      PT SHORT TERM GOAL #1   Title  independent with initial HEP    Status  Achieved        PT Long Term Goals - 10/26/17 1142      PT LONG TERM GOAL #1   Title  understand proper posture and body mechanics    Status  Achieved      PT LONG TERM GOAL #2   Title  decrease pain 50%      PT LONG TERM GOAL #3   Title  increase cervical ROM 25%    Status  On-going      PT LONG  TERM GOAL #4   Title  report little issue with backing up the car    Status  Partially Met            Plan - 10/26/17 1141    Clinical Impression Statement  Pt tolerated activities with increase resistance, no issues with the added overhead extensions. Postural cues needed with standing shoulder extensions. LOB has improved, some popping noted in L scapula.    Rehab Potential  Good    PT Frequency  2x / week    PT Duration  8 weeks    PT Treatment/Interventions  ADLs/Self Care Home Management;Electrical Stimulation;Moist Heat;Traction;Therapeutic activities;Therapeutic exercise;Manual techniques;Patient/family education;Dry needling    PT Next Visit Plan  continue to progress as tolerated       Patient will benefit from skilled therapeutic intervention in order to improve the following deficits and impairments:  Decreased range of motion, Increased muscle spasms, Pain, Improper body mechanics, Impaired flexibility, Decreased strength, Postural dysfunction  Visit Diagnosis: Cervicalgia  Cramp and spasm  Chronic bilateral low back pain without sciatica     Problem List Patient Active Problem List   Diagnosis Date Noted  . Polycythemia 05/12/2017  . Vitamin D deficiency 04/21/2017  . Bony sclerosis 08/05/2016  . Lung nodule   . Chronic pain of both knees  12/05/2015  . Gastroesophageal reflux disease without esophagitis 05/17/2014  . Essential hypertension 05/17/2014  . Coronary artery calcification 08/17/2012  . Chronic abdominal pain 06/24/2012  . Right-sided chest wall pain 06/24/2012  . Constipation, chronic 06/24/2012  . Hyperbilirubinemia 06/24/2012  . Chronic back pain 06/24/2012  . DDD (degenerative disc disease), thoracolumbar 06/24/2012  . History of nephrolithiasis 06/24/2012  . Side pain 06/24/2012  . Low back pain at multiple sites 06/24/2012    Scot Jun 10/26/2017, 11:44 AM  [place], [place], [address] Phone: [phone]   Fax:  [phone]  Name: Aaron Dennis MRN: [id number] Date of Birth: Jun 17, 1955

## 2017-10-29 ENCOUNTER — Ambulatory Visit: Payer: Self-pay | Admitting: Physical Therapy

## 2017-10-29 ENCOUNTER — Encounter: Payer: Self-pay | Admitting: Physical Therapy

## 2017-10-29 DIAGNOSIS — M545 Low back pain, unspecified: Secondary | ICD-10-CM

## 2017-10-29 DIAGNOSIS — M542 Cervicalgia: Secondary | ICD-10-CM

## 2017-10-29 DIAGNOSIS — G8929 Other chronic pain: Secondary | ICD-10-CM

## 2017-10-29 DIAGNOSIS — R252 Cramp and spasm: Secondary | ICD-10-CM

## 2017-10-29 NOTE — Therapy (Signed)
[place], [place], [address] Phone: [phone]   Fax:  [phone] Progress Note Reporting Period 7/26/19to 10/29/17 for the first 10 visits  See note below for Objective Data and Assessment of Progress/Goals.      Physical Therapy Treatment  Patient Details  Name: Aaron Dennis MRN: [id number] Date of Birth: 10/06/1955 Referring Provider: Margarita Rana   Encounter Date: 10/29/2017  PT End of Session - 10/29/17 1135    Visit Number  10    Date for PT Re-Evaluation  11/04/17    PT Start Time  1057    PT Stop Time  1147    PT Time Calculation (min)  50 min    Activity Tolerance  Patient tolerated treatment well    Behavior During Therapy  [facility] for tasks assessed/performed       Past Medical History:  Diagnosis Date  . GERD (gastroesophageal reflux disease)   . Hypertension   . Lung nodule    7 mm LUL nodule    History reviewed. No pertinent surgical history.  There were no vitals filed for this visit.  Subjective Assessment - 10/29/17 1133    Subjective  Patient reports very tired today, still with c/o shoulder popping    Currently in Pain?  Yes    Pain Score  5     Pain Location  Back   left scapular area   Aggravating Factors   lifting, sitting                       [facility] Adult PT Treatment/Exercise - 10/29/17 0001      Neck Exercises: Machines for Strengthening   UBE (Upper Arm Bike)  L4 x 6 min   Nustep Level 5 x 5 minutes    Cybex Row  35# 2x10    Lat Pull  35# 2x10      Lumbar Exercises: Machines for Strengthening   Cybex Knee Extension  5# 2x10    Cybex Knee Flexion  25# 2x10    Leg Press  30lb 2x10       Moist Heat Therapy   Number Minutes Moist Heat  15 Minutes    Moist Heat Location  Cervical;Lumbar Spine      Electrical Stimulation   Electrical Stimulation Location  R lumbar, mid T    Electrical Stimulation Action  IFC    Electrical Stimulation  Parameters  supine    Electrical Stimulation Goals  Pain      Manual Therapy   Manual Therapy  Soft tissue mobilization    Manual therapy comments  gentle thoracic joint bobilization    Soft tissue mobilization  to the thoracic and lumbar paraspinals and into the right buttock, some use of vibration    Passive ROM  passive stretches to the LE's               PT Short Term Goals - 09/21/17 1121      PT SHORT TERM GOAL #1   Title  independent with initial HEP    Status  Achieved        PT Long Term Goals - 10/29/17 1138      PT LONG TERM GOAL #2   Title  decrease pain 50%    Status  Partially Met      PT LONG TERM GOAL #3   Title  increase cervical ROM 25%    Status  Partially  Met            Plan - 10/29/17 1136    Clinical Impression Statement  Patient has continued to have c/o pian and tightness, he has been negative at times just in general I asked about depression, he denied.  He does have some spasms in the paraspinal mms, he seems to be tight in the thoracic area with intervertebral motions.    PT Next Visit Plan  may try some low load long duration thoracic extnesion    Consulted and Agree with Plan of Care  Patient       Patient will benefit from skilled therapeutic intervention in order to improve the following deficits and impairments:  Decreased range of motion, Increased muscle spasms, Pain, Improper body mechanics, Impaired flexibility, Decreased strength, Postural dysfunction  Visit Diagnosis: Cervicalgia  Cramp and spasm  Chronic bilateral low back pain without sciatica     Problem List Patient Active Problem List   Diagnosis Date Noted  . Polycythemia 05/12/2017  . Vitamin D deficiency 04/21/2017  . Bony sclerosis 08/05/2016  . Lung nodule   . Chronic pain of both knees 12/05/2015  . Gastroesophageal reflux disease without esophagitis 05/17/2014  . Essential hypertension 05/17/2014  . Coronary artery calcification 08/17/2012  .  Chronic abdominal pain 06/24/2012  . Right-sided chest wall pain 06/24/2012  . Constipation, chronic 06/24/2012  . Hyperbilirubinemia 06/24/2012  . Chronic back pain 06/24/2012  . DDD (degenerative disc disease), thoracolumbar 06/24/2012  . History of nephrolithiasis 06/24/2012  . Side pain 06/24/2012  . Low back pain at multiple sites 06/24/2012    Sumner Boast., PT 10/29/2017, 11:39 AM  [place], [place], [address] Phone: [phone]   Fax:  [phone]  Name: Aaron Dennis MRN: [id number] Date of Birth: 1955-06-08

## 2017-11-02 ENCOUNTER — Ambulatory Visit: Payer: Self-pay | Admitting: Physical Therapy

## 2017-11-02 ENCOUNTER — Encounter: Payer: Self-pay | Admitting: Physical Therapy

## 2017-11-02 DIAGNOSIS — G8929 Other chronic pain: Secondary | ICD-10-CM

## 2017-11-02 DIAGNOSIS — R252 Cramp and spasm: Secondary | ICD-10-CM

## 2017-11-02 DIAGNOSIS — M545 Low back pain, unspecified: Secondary | ICD-10-CM

## 2017-11-02 DIAGNOSIS — M542 Cervicalgia: Secondary | ICD-10-CM

## 2017-11-02 NOTE — Therapy (Signed)
[place], [place], [address] Phone: [phone]   Fax:  [phone]  Physical Therapy Treatment  Patient Details  Name: Aaron Dennis MRN: [id number] Date of Birth: 12/28/1955 Referring Provider: Margarita Rana   Encounter Date: 11/02/2017  PT End of Session - 11/02/17 1140    Visit Number  11    Date for PT Re-Evaluation  11/04/17    PT Start Time  1100    PT Stop Time  1141    PT Time Calculation (min)  41 min    Activity Tolerance  Patient tolerated treatment well    Behavior During Therapy  [facility] for tasks assessed/performed       Past Medical History:  Diagnosis Date  . GERD (gastroesophageal reflux disease)   . Hypertension   . Lung nodule    7 mm LUL nodule    History reviewed. No pertinent surgical history.  There were no vitals filed for this visit.  Subjective Assessment - 11/02/17 1105    Subjective  "I am doing all right today"    Currently in Pain?  Yes    Pain Score  6     Pain Location  Back    Pain Orientation  Right                       [facility] Adult PT Treatment/Exercise - 11/02/17 0001      Neck Exercises: Machines for Strengthening   UBE (Upper Arm Bike)  L4 x 6 min   Nustep Level 5 x 5 minutes      Lumbar Exercises: Machines for Strengthening   Cybex Knee Flexion  25# 2x10    Leg Press  30lb 2x10       Lumbar Exercises: Supine   Other Supine Lumbar Exercises  feet on ball K2C, trunk rotation, small bridges and isometric abdominals      Manual Therapy   Manual Therapy  Soft tissue mobilization    Soft tissue mobilization  to the thoracic and lumbar paraspinals and into the right buttock, some use of vibration    Passive ROM  passive stretches to the LE's               PT Short Term Goals - 09/21/17 1121      PT SHORT TERM GOAL #1   Title  independent with initial HEP    Status  Achieved        PT Long Term Goals - 10/29/17 1138       PT LONG TERM GOAL #2   Title  decrease pain 50%    Status  Partially Met      PT LONG TERM GOAL #3   Title  increase cervical ROM 25%    Status  Partially Met            Plan - 11/02/17 1141    Clinical Impression Statement  Pt enters clinic reporting that he is feeling well today despite high pain rating. He said last treatment really helped. Continues with some of the same treatment interventions again pt has no issues with the exercises. 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 Positive response to the manual interventions.      PT Frequency  2x / week    PT Duration  8 weeks    PT Treatment/Interventions  ADLs/Self Care Home  Management;Electrical Stimulation;Moist Heat;Traction;Therapeutic activities;Therapeutic exercise;Manual techniques;Patient/family education;Dry needling    PT Next Visit Plan  may try some low load long duration thoracic extnesion       Patient will benefit from skilled therapeutic intervention in order to improve the following deficits and impairments:  Decreased range of motion, Increased muscle spasms, Pain, Improper body mechanics, Impaired flexibility, Decreased strength, Postural dysfunction  Visit Diagnosis: Cervicalgia  Cramp and spasm  Chronic bilateral low back pain without sciatica     Problem List Patient Active Problem List   Diagnosis Date Noted  . Polycythemia 05/12/2017  . Vitamin D deficiency 04/21/2017  . Bony sclerosis 08/05/2016  . Lung nodule   . Chronic pain of both knees 12/05/2015  . Gastroesophageal reflux disease without esophagitis 05/17/2014  . Essential hypertension 05/17/2014  . Coronary artery calcification 08/17/2012  . Chronic abdominal pain 06/24/2012  . Right-sided chest wall pain 06/24/2012  . Constipation, chronic 06/24/2012  . Hyperbilirubinemia 06/24/2012  . Chronic back pain 06/24/2012  . DDD (degenerative disc disease), thoracolumbar 06/24/2012   . History of nephrolithiasis 06/24/2012  . Side pain 06/24/2012  . Low back pain at multiple sites 06/24/2012    Scot Jun, PTA 11/02/2017, 11:45 AM  [place], [place], [address] Phone: [phone]   Fax:  [phone]  Name: Aaron Dennis MRN: [id number] Date of Birth: [birth year]

## 2017-11-04 ENCOUNTER — Ambulatory Visit: Payer: Self-pay | Admitting: Physical Therapy

## 2017-11-04 DIAGNOSIS — M542 Cervicalgia: Secondary | ICD-10-CM

## 2017-11-04 DIAGNOSIS — G8929 Other chronic pain: Secondary | ICD-10-CM

## 2017-11-04 DIAGNOSIS — M545 Low back pain, unspecified: Secondary | ICD-10-CM

## 2017-11-04 DIAGNOSIS — R252 Cramp and spasm: Secondary | ICD-10-CM

## 2017-11-04 NOTE — Therapy (Signed)
[place], [place], [address] Phone: [phone]   Fax:  [phone]  Physical Therapy Treatment  Patient Details  Name: REFUGIO VANDEVOORDE MRN: [id number] Date of Birth: 1955-06-19 Referring Provider: Margarita Rana   Encounter Date: 11/04/2017  PT End of Session - 11/04/17 1111    Visit Number  12    Date for PT Re-Evaluation  11/04/17    PT Start Time  1100    PT Stop Time  1140    PT Time Calculation (min)  40 min       Past Medical History:  Diagnosis Date  . GERD (gastroesophageal reflux disease)   . Hypertension   . Lung nodule    7 mm LUL nodule    No past surgical history on file.  There were no vitals filed for this visit.  Subjective Assessment - 11/04/17 1103    Subjective  today is my last day    Currently in Pain?  Yes    Pain Score  6          [facility] PT Assessment - 11/04/17 0001      AROM   Overall AROM Comments  decreased 25% throughout, ext 50%                   [facility] Adult PT Treatment/Exercise - 11/04/17 0001      Neck Exercises: Machines for Strengthening   UBE (Upper Arm Bike)  L 4 3 fwd/3 back    Cybex Row  35# 2x10    Lat Pull  35# 2x10      Lumbar Exercises: Machines for Strengthening   Leg Press  30lb 2x10       Lumbar Exercises: Supine   Other Supine Lumbar Exercises  feet on ball K2C, trunk rotation, small bridges and isometric abdominals      Manual Therapy   Manual Therapy  Soft tissue mobilization    Soft tissue mobilization  to the thoracic and lumbar paraspinals and into the right buttock, some use of vibration    Passive ROM  passive stretches to the LE's               PT Short Term Goals - 09/21/17 1121      PT SHORT TERM GOAL #1   Title  independent with initial HEP    Status  Achieved        PT Long Term Goals - 11/04/17 1105      PT LONG TERM GOAL #1   Title  understand proper posture and body mechanics    Status  Achieved      PT LONG TERM GOAL #2   Title  decrease pain 50%    Status  Achieved      PT LONG TERM GOAL #3   Title  increase cervical ROM 25%    Status  Achieved      PT LONG TERM GOAL #4   Title  report little issue with backing up the car    Baseline  better but still and issue at times    Status  Partially Met            Plan - 11/04/17 1113    Clinical Impression Statement  goals partail met and pt pleased with progress. pt has HEP for stretch and strength    PT Treatment/Interventions  ADLs/Self Care Home Management;Electrical Stimulation;Moist Heat;Traction;Therapeutic activities;Therapeutic exercise;Manual techniques;Patient/family education;Dry needling  PT Next Visit Plan  D/C       Patient will benefit from skilled therapeutic intervention in order to improve the following deficits and impairments:  Decreased range of motion, Increased muscle spasms, Pain, Improper body mechanics, Impaired flexibility, Decreased strength, Postural dysfunction  Visit Diagnosis: Cervicalgia  Chronic bilateral low back pain without sciatica  Cramp and spasm     Problem List Patient Active Problem List   Diagnosis Date Noted  . Polycythemia 05/12/2017  . Vitamin D deficiency 04/21/2017  . Bony sclerosis 08/05/2016  . Lung nodule   . Chronic pain of both knees 12/05/2015  . Gastroesophageal reflux disease without esophagitis 05/17/2014  . Essential hypertension 05/17/2014  . Coronary artery calcification 08/17/2012  . Chronic abdominal pain 06/24/2012  . Right-sided chest wall pain 06/24/2012  . Constipation, chronic 06/24/2012  . Hyperbilirubinemia 06/24/2012  . Chronic back pain 06/24/2012  . DDD (degenerative disc disease), thoracolumbar 06/24/2012  . History of nephrolithiasis 06/24/2012  . Side pain 06/24/2012  . Low back pain at multiple sites 06/24/2012   PHYSICAL THERAPY DISCHARGE SUMMARY   Plan: Patient agrees to discharge.  Patient goals were  not met. Patient is being discharged due to meeting the stated rehab goals.  ?????      Jyron Turman,ANGIE 11/04/2017, 11:16 AM  [place], [place], [address] Phone: [phone]   Fax:  [phone]  Name: HUGH KAMARA MRN: [id number] Date of Birth: 11-13-1955

## 2017-11-09 ENCOUNTER — Encounter: Payer: Self-pay | Admitting: Family Medicine

## 2017-11-09 ENCOUNTER — Ambulatory Visit: Payer: Self-pay | Attending: Family Medicine | Admitting: Family Medicine

## 2017-11-09 VITALS — BP 149/84 | HR 75 | Temp 97.8°F | Ht 72.5 in | Wt 185.0 lb

## 2017-11-09 DIAGNOSIS — K648 Other hemorrhoids: Secondary | ICD-10-CM | POA: Insufficient documentation

## 2017-11-09 DIAGNOSIS — M722 Plantar fascial fibromatosis: Secondary | ICD-10-CM | POA: Insufficient documentation

## 2017-11-09 DIAGNOSIS — K219 Gastro-esophageal reflux disease without esophagitis: Secondary | ICD-10-CM | POA: Insufficient documentation

## 2017-11-09 DIAGNOSIS — M5135 Other intervertebral disc degeneration, thoracolumbar region: Secondary | ICD-10-CM | POA: Insufficient documentation

## 2017-11-09 DIAGNOSIS — I10 Essential (primary) hypertension: Secondary | ICD-10-CM | POA: Insufficient documentation

## 2017-11-09 DIAGNOSIS — M25571 Pain in right ankle and joints of right foot: Secondary | ICD-10-CM | POA: Insufficient documentation

## 2017-11-09 DIAGNOSIS — G8929 Other chronic pain: Secondary | ICD-10-CM | POA: Insufficient documentation

## 2017-11-09 DIAGNOSIS — Z79899 Other long term (current) drug therapy: Secondary | ICD-10-CM | POA: Insufficient documentation

## 2017-11-09 MED ORDER — AMLODIPINE BESYLATE 5 MG PO TABS: 5.0000 mg | ORAL_TABLET | Freq: Every day | ORAL | 1 refills | Status: DC

## 2017-11-09 MED ORDER — MELOXICAM 7.5 MG PO TABS: 7.5000 mg | ORAL_TABLET | Freq: Every day | ORAL | 1 refills | Status: DC

## 2017-11-09 MED ORDER — HYDROCORTISONE ACE-PRAMOXINE 2.5-1 % RE CREA: 1.0000 "application " | TOPICAL_CREAM | Freq: Three times a day (TID) | RECTAL | 3 refills | Status: DC

## 2017-11-09 MED ORDER — OMEPRAZOLE 40 MG PO CPDR: 40.0000 mg | DELAYED_RELEASE_CAPSULE | Freq: Every day | ORAL | 1 refills | Status: DC

## 2017-11-09 MED ORDER — METHOCARBAMOL 750 MG PO TABS: 750.0000 mg | ORAL_TABLET | Freq: Two times a day (BID) | ORAL | 1 refills | Status: DC | PRN

## 2017-11-09 MED FILL — MELOXICAM 7.5 MG TABLET: 7.5 | 30 days supply | Qty: 30 | Fill #0

## 2017-11-09 MED FILL — OMEPRAZOLE DR 40 MG CAPSULE: 40 | 30 days supply | Qty: 30 | Fill #0

## 2017-11-09 MED FILL — METHOCARBAMOL 750 MG TABS: 750 | 30 days supply | Qty: 60 | Fill #0

## 2017-11-09 MED FILL — AMLODIPINE BESYLATE 5 MG TA: 5 | 30 days supply | Qty: 30 | Fill #0

## 2017-11-09 MED FILL — HYDROCORT-PRAMOXINE 2.5-1%: 2.5-1 | 10 days supply | Qty: 30 | Fill #0

## 2017-11-09 NOTE — Patient Instructions (Signed)
Hemorrhoids Hemorrhoids are swollen veins in and around the rectum or anus. There are two types of hemorrhoids:  Internal hemorrhoids. These occur in the veins that are just inside the rectum. They may poke through to the outside and become irritated and painful.  External hemorrhoids. These occur in the veins that are outside of the anus and can be felt as a painful swelling or hard lump near the anus.  Most hemorrhoids do not cause serious problems, and they can be managed with home treatments such as diet and lifestyle changes. If home treatments do not help your symptoms, procedures can be done to shrink or remove the hemorrhoids. What are the causes? This condition is caused by increased pressure in the anal area. This pressure may result from various things, including:  Constipation.  Straining to have a bowel movement.  Diarrhea.  Pregnancy.  Obesity.  Sitting for long periods of time.  Heavy lifting or other activity that causes you to strain.  Anal sex.  What are the signs or symptoms? Symptoms of this condition include:  Pain.  Anal itching or irritation.  Rectal bleeding.  Leakage of stool (feces).  Anal swelling.  One or more lumps around the anus.  How is this diagnosed? This condition can often be diagnosed through a visual exam. Other exams or tests may also be done, such as:  Examination of the rectal area with a gloved hand (digital rectal exam).  Examination of the anal canal using a small tube (anoscope).  A blood test, if you have lost a significant amount of blood.  A test to look inside the colon (sigmoidoscopy or colonoscopy).  How is this treated? This condition can usually be treated at home. However, various procedures may be done if dietary changes, lifestyle changes, and other home treatments do not help your symptoms. These procedures can help make the hemorrhoids smaller or remove them completely. Some of these procedures involve  surgery, and others do not. Common procedures include:  Rubber band ligation. Rubber bands are placed at the base of the hemorrhoids to cut off the blood supply to them.  Sclerotherapy. Medicine is injected into the hemorrhoids to shrink them.  Infrared coagulation. A type of light energy is used to get rid of the hemorrhoids.  Hemorrhoidectomy surgery. The hemorrhoids are surgically removed, and the veins that supply them are tied off.  Stapled hemorrhoidopexy surgery. A circular stapling device is used to remove the hemorrhoids and use staples to cut off the blood supply to them.  Follow these instructions at home: Eating and drinking  Eat foods that have a lot of fiber in them, such as whole grains, beans, nuts, fruits, and vegetables. Ask your health care provider about taking products that have added fiber (fiber supplements).  Drink enough fluid to keep your urine clear or pale yellow. Managing pain and swelling  Take warm sitz baths for 20 minutes, 3-4 times a day to ease pain and discomfort.  If directed, apply ice to the affected area. Using ice packs between sitz baths may be helpful. ? Put ice in a plastic bag. ? Place a towel between your skin and the bag. ? Leave the ice on for 20 minutes, 2-3 times a day. General instructions  Take over-the-counter and prescription medicines only as told by your health care provider.  Use medicated creams or suppositories as told.  Exercise regularly.  Go to the bathroom when you have the urge to have a bowel movement. Do not wait.    Avoid straining to have bowel movements.  Keep the anal area dry and clean. Use wet toilet paper or moist towelettes after a bowel movement.  Do not sit on the toilet for long periods of time. This increases blood pooling and pain. Contact a health care provider if:  You have increasing pain and swelling that are not controlled by treatment or medicine.  You have uncontrolled bleeding.  You  have difficulty having a bowel movement, or you are unable to have a bowel movement.  You have pain or inflammation outside the area of the hemorrhoids. This information is not intended to replace advice given to you by your health care provider. Make sure you discuss any questions you have with your health care provider. Document Released: 01/24/2000 Document Revised: 06/26/2015 Document Reviewed: 10/10/2014 Elsevier Interactive Patient Education  2018 [employer]  

## 2017-11-09 NOTE — Progress Notes (Signed)
Subjective:  Patient ID: Aaron Dennis, male    DOB: Mar 17, 1955  Age: 62 y.o. MRN: [id number]  CC: Hypertension   HPI Aaron Dennis  is a 62 year old male with a history of hypertension, polycythemia (s/p phlebotomy in the past), chronic thoracolumbar pain, GERD here for a follow-up visit.   His back pain is not getting any better and he has completed a 56-month session of physical therapy.  Pain is more pronounced on the right side of his lower back and does not radiate but is worse on twisting motion.  Denies numbness in his legs, falls, loss of sphincteric function. He also has posterior neck pain which does not radiate to his upper extremities.  Also complains of pain in his ankle which has been chronic and described as moderate with no swelling.  Pain is more at the lateral aspect of his right ankle and he has also noticed pain in his right heel on waking up first thing in the morning. He works long hours as a Education officer, [employer].  He complains of rectal pain when he uses the bathroom and sometimes when he sits.  Denies being constipated.  His colonoscopy report revealed presence of internal hemorrhoids.  He denies rectal bleeding.  Past Medical History:  Diagnosis Date  . GERD (gastroesophageal reflux disease)   . Hypertension   . Lung nodule    7 mm LUL nodule    No past surgical history on file.  No Known Allergies   Outpatient Medications Prior to Visit  Medication Sig Dispense Refill  . amLODipine (NORVASC) 5 MG tablet Take 1 tablet (5 mg total) by mouth daily. 90 tablet 1  . methocarbamol (ROBAXIN-750) 750 MG tablet Take 1 tablet (750 mg total) by mouth 2 (two) times daily as needed for muscle spasms. 180 tablet 1  . omeprazole (PRILOSEC) 40 MG capsule Take 1 capsule (40 mg total) by mouth daily. 90 capsule 1   Facility-Administered Medications Prior to Visit  Medication Dose Route Frequency Provider Last Rate Last Dose  . 0.9 %  sodium chloride infusion  500 mL Intravenous Once  Jackquline Denmark, MD        ROS Review of Systems  Constitutional: Negative for activity change and appetite change.  HENT: Negative for sinus pressure and sore throat.   Eyes: Negative for visual disturbance.  Respiratory: Negative for cough, chest tightness and shortness of breath.   Cardiovascular: Negative for chest pain and leg swelling.  Gastrointestinal: Negative for abdominal distention, abdominal pain, constipation and diarrhea.  Endocrine: Negative.   Genitourinary: Negative for dysuria.  Musculoskeletal:       See hpi  Skin: Negative for rash.  Allergic/Immunologic: Negative.   Neurological: Negative for weakness, light-headedness and numbness.  Psychiatric/Behavioral: Negative for dysphoric mood and suicidal ideas.    Objective:  BP (!) 149/84   Pulse 75   Temp 97.8 F (36.6 C) (Oral)   Ht 6' 0.5" (1.842 m)   Wt 185 lb (83.9 kg)   SpO2 100%   BMI 24.75 kg/m   BP/Weight 11/09/2017 09/13/2017 8/41/6606  Systolic BP 301 601 093  Diastolic BP 84 77 74  Wt. (Lbs) 185 188.1 186.8  BMI 24.75 25.16 24.99      Physical Exam  Constitutional: He is oriented to person, place, and time. He appears well-developed and well-nourished.  Cardiovascular: Normal rate, normal heart sounds and intact distal pulses.  No murmur heard. Pulmonary/Chest: Effort normal and breath sounds normal. He has no wheezes. He  has no rales. He exhibits no tenderness.  Abdominal: Soft. Bowel sounds are normal. He exhibits no distension and no mass. There is no tenderness.  Musculoskeletal: Normal range of motion.  No tenderness on palpation of entire spine Tenderness on palpation of right inferior border of rib cage Negative straight leg raise bilaterally No tenderness elicited on palpation or range of motion of both ankles; no tenderness on palpation of right heel.  Neurological: He is alert and oriented to person, place, and time.  Skin: Skin is warm.  Psychiatric: He has a normal mood and  affect.    CMP Latest Ref Rng & Units 07/12/2017 04/28/2017 04/08/2017  Glucose 65 - 99 mg/dL 106(H) 127 77  BUN 6 - 20 mg/dL 11 12 12   Creatinine 0.61 - 1.24 mg/dL 0.97 0.92 0.91  Sodium 135 - 145 mmol/L 140 141 142  Potassium 3.5 - 5.1 mmol/L 3.8 3.4(L) 4.0  Chloride 101 - 111 mmol/L 105 107 103  CO2 22 - 32 mmol/L 24 25 23   Calcium 8.9 - 10.3 mg/dL 8.8(L) 9.5 9.2  Total Protein 6.5 - 8.1 g/dL 7.3 7.5 7.3  Total Bilirubin 0.3 - 1.2 mg/dL 2.3(H) 1.4(H) 1.4(H)  Alkaline Phos 38 - 126 U/L 137(H) 155(H) 162(H)  AST 15 - 41 U/L 17 20 19   ALT 17 - 63 U/L 16(L) 22 20    Assessment & Plan:   1. Gastroesophageal reflux disease without esophagitis Controlled - omeprazole (PRILOSEC) 40 MG capsule; Take 1 capsule (40 mg total) by mouth daily.  Dispense: 90 capsule; Refill: 1  2. DDD (degenerative disc disease), thoracolumbar Uncontrolled Completed session of PT with no improvement Advised to perform home exercises, yoga Commenced NSAIDs Explained there is no indication for MRI at this time as he has no red flags If symptoms persist will consider imaging - meloxicam (MOBIC) 7.5 MG tablet; Take 1 tablet (7.5 mg total) by mouth daily.  Dispense: 30 tablet; Refill: 1 - methocarbamol (ROBAXIN-750) 750 MG tablet; Take 1 tablet (750 mg total) by mouth 2 (two) times daily as needed for muscle spasms.  Dispense: 180 tablet; Refill: 1  3. Essential hypertension Controlled Counseled on blood pressure goal of less than 130/80, low-sodium, DASH diet, medication compliance, 150 minutes of moderate intensity exercise per week. Discussed medication compliance, adverse effects. - amLODipine (NORVASC) 5 MG tablet; Take 1 tablet (5 mg total) by mouth daily.  Dispense: 90 tablet; Refill: 1  4. Other hemorrhoids Increase fiber intake to prevent constipation Sitz baths - hydrocortisone-pramoxine (ANALPRAM HC) 2.5-1 % rectal cream; Place 1 application rectally 3 (three) times daily.  Dispense: 30 g; Refill:  3  5. Plantar fasciitis Use insoles - Ambulatory referral to Podiatry - meloxicam (MOBIC) 7.5 MG tablet; Take 1 tablet (7.5 mg total) by mouth daily.  Dispense: 30 tablet; Refill: 1  6. 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  Dispense: 30 tablet; Refill: 1   Meds ordered this encounter  Medications  . meloxicam (MOBIC) 7.5 MG tablet    Sig: Take 1 tablet (7.5 mg total) by mouth daily.    Dispense:  30 tablet    Refill:  1  . hydrocortisone-pramoxine (ANALPRAM HC) 2.5-1 % rectal cream    Sig: Place 1 application rectally 3 (three) times daily.    Dispense:  30 g  Refill:  3  . omeprazole (PRILOSEC) 40 MG capsule    Sig: Take 1 capsule (40 mg total) by mouth daily.    Dispense:  90 capsule    Refill:  1  . methocarbamol (ROBAXIN-750) 750 MG tablet    Sig: Take 1 tablet (750 mg total) by mouth 2 (two) times daily as needed for muscle spasms.    Dispense:  180 tablet    Refill:  1  . amLODipine (NORVASC) 5 MG tablet    Sig: Take 1 tablet (5 mg total) by mouth daily.    Dispense:  90 tablet    Refill:  1    Follow-up: Return in about 6 months (around 05/11/2018) for Follow-up of chronic medical conditions.   Charlott Rakes MD

## 2017-11-24 ENCOUNTER — Ambulatory Visit: Payer: Self-pay | Admitting: Family Medicine

## 2017-11-26 ENCOUNTER — Ambulatory Visit (INDEPENDENT_AMBULATORY_CARE_PROVIDER_SITE_OTHER): Payer: No Typology Code available for payment source

## 2017-11-26 ENCOUNTER — Ambulatory Visit (INDEPENDENT_AMBULATORY_CARE_PROVIDER_SITE_OTHER): Payer: No Typology Code available for payment source | Admitting: Podiatry

## 2017-11-26 DIAGNOSIS — M2141 Flat foot [pes planus] (acquired), right foot: Secondary | ICD-10-CM

## 2017-11-26 DIAGNOSIS — M2142 Flat foot [pes planus] (acquired), left foot: Secondary | ICD-10-CM

## 2017-11-26 DIAGNOSIS — M7750 Other enthesopathy of unspecified foot: Secondary | ICD-10-CM

## 2017-11-26 MED ORDER — DICLOFENAC SODIUM 1 % TD GEL: 2.0000 g | Freq: Four times a day (QID) | TRANSDERMAL | 2 refills | Status: DC

## 2017-11-26 MED FILL — DICLOFENAC SODIUM 1% GEL: 1 | 12 days supply | Qty: 100 | Fill #0

## 2017-11-28 DIAGNOSIS — M7750 Other enthesopathy of unspecified foot: Secondary | ICD-10-CM | POA: Insufficient documentation

## 2017-11-28 DIAGNOSIS — M2140 Flat foot [pes planus] (acquired), unspecified foot: Secondary | ICD-10-CM | POA: Insufficient documentation

## 2017-11-28 NOTE — Progress Notes (Signed)
Subjective:   Patient ID: Aaron Dennis, male   DOB: 62 y.o.   MRN: [id number]   HPI 62 year old male presents the office today for concerns of pain to the outside aspect of his right foot.  Points more to the fifth metatarsal base he is majority of tenderness as well as down the lateral aspect of the foot on the right side.  This been a chronic issue for him he said multiple steroid injections he states that it does not help.  Denies any recent injury or trauma denies any swelling.  No numbness or tingling.  The pain does not wake him up at night.  He has no other concerns today.  He is previously seen Dr. Paulla Dolly for the same issue as well as another physician that he cannot remember who.   Review of Systems  All other systems reviewed and are negative.  Past Medical History:  Diagnosis Date  . GERD (gastroesophageal reflux disease)   . Hypertension   . Lung nodule    7 mm LUL nodule    No past surgical history on file.   Current Outpatient Medications:  .  amLODipine (NORVASC) 5 MG tablet, Take 1 tablet (5 mg total) by mouth daily., Disp: 90 tablet, Rfl: 1 .  diclofenac sodium (VOLTAREN) 1 % GEL, Apply 2 g topically 4 (four) times daily. Rub into affected area of foot 2 to 4 times daily, Disp: 100 g, Rfl: 2 .  hydrocortisone-pramoxine (ANALPRAM HC) 2.5-1 % rectal cream, Place 1 application rectally 3 (three) times daily., Disp: 30 g, Rfl: 3 .  meloxicam (MOBIC) 7.5 MG tablet, Take 1 tablet (7.5 mg total) by mouth daily., Disp: 30 tablet, Rfl: 1 .  methocarbamol (ROBAXIN-750) 750 MG tablet, Take 1 tablet (750 mg total) by mouth 2 (two) times daily as needed for muscle spasms., Disp: 180 tablet, Rfl: 1 .  omeprazole (PRILOSEC) 40 MG capsule, Take 1 capsule (40 mg total) by mouth daily., Disp: 90 capsule, Rfl: 1  Current Facility-Administered Medications:  .  0.9 %  sodium chloride infusion, 500 mL, Intravenous, Once, Jackquline Denmark, MD  No Known Allergies   Objective:  Physical  Exam  General: AAO x3, NAD  Dermatological: Skin is warm, dry and supple bilateral. Nails x 10 are well manicured; remaining integument appears unremarkable at this time. There are no open sores, no preulcerative lesions, no rash or signs of infection present.  Vascular: Dorsalis Pedis artery and Posterior Tibial artery pedal pulses are 2/4 bilateral with immedate capillary fill time. Pedal hair growth present. No varicosities and no lower extremity edema present bilateral. There is no pain with calf compression, swelling, warmth, erythema.   Neruologic: Grossly intact via light touch bilateral.  Protective threshold with Semmes Wienstein monofilament intact to all pedal sites bilateral.  Negative Tinel sign.  Musculoskeletal: There is a decrease in medial arch upon weightbearing.  Ecchymosis to the fifth metatarsal base.  Peroneal tendon appears to be intact and there is no pain along the tendon itself.  Some mild discomfort of the fourth metatarsal cuboid joint. Again this is been a chronic issue for him.  There is no edema, erythema to the area.  There is no other areas of tenderness identified today.  Gait: Unassisted, Nonantalgic.       Assessment:   Right lateral foot pain likely biomechanical in nature with insertional peroneal tendinitis    Plan:  -Treatment options discussed including all alternatives, risks, and complications -Etiology of symptoms were discussed -X-rays  were obtained and reviewed with the patient.  No definitive evidence of acute fracture or stress fracture was identified today. -Prescribed Voltaren gel that he can provide.  Discussed side effects. -Ice to the area. -I dispensed a pair of power steps for him today.  Discussed break-in instructions.  We have very flat shoes we discussed try to wear shoe with more support.  Trula Slade DPM

## 2018-01-11 MED FILL — ?AMLODIPINE BESYLATE 5 MG T: 5 | 30 days supply | Qty: 30 | Fill #1

## 2018-01-11 MED FILL — OMEPRAZOLE DR 40 MG CAPSULE: 40 | 30 days supply | Qty: 30 | Fill #1

## 2018-01-19 ENCOUNTER — Ambulatory Visit: Payer: Self-pay | Attending: Family Medicine | Admitting: Physician Assistant

## 2018-01-19 VITALS — BP 158/94 | HR 82 | Temp 98.2°F | Resp 16 | Wt 190.6 lb

## 2018-01-19 DIAGNOSIS — K219 Gastro-esophageal reflux disease without esophagitis: Secondary | ICD-10-CM | POA: Insufficient documentation

## 2018-01-19 DIAGNOSIS — Z791 Long term (current) use of non-steroidal anti-inflammatories (NSAID): Secondary | ICD-10-CM | POA: Insufficient documentation

## 2018-01-19 DIAGNOSIS — M25571 Pain in right ankle and joints of right foot: Secondary | ICD-10-CM

## 2018-01-19 DIAGNOSIS — M25511 Pain in right shoulder: Secondary | ICD-10-CM | POA: Insufficient documentation

## 2018-01-19 DIAGNOSIS — R9389 Abnormal findings on diagnostic imaging of other specified body structures: Secondary | ICD-10-CM | POA: Insufficient documentation

## 2018-01-19 DIAGNOSIS — R948 Abnormal results of function studies of other organs and systems: Secondary | ICD-10-CM | POA: Insufficient documentation

## 2018-01-19 DIAGNOSIS — M542 Cervicalgia: Secondary | ICD-10-CM | POA: Insufficient documentation

## 2018-01-19 DIAGNOSIS — M722 Plantar fascial fibromatosis: Secondary | ICD-10-CM

## 2018-01-19 DIAGNOSIS — M5135 Other intervertebral disc degeneration, thoracolumbar region: Secondary | ICD-10-CM | POA: Insufficient documentation

## 2018-01-19 DIAGNOSIS — Z79899 Other long term (current) drug therapy: Secondary | ICD-10-CM | POA: Insufficient documentation

## 2018-01-19 DIAGNOSIS — G8929 Other chronic pain: Secondary | ICD-10-CM | POA: Insufficient documentation

## 2018-01-19 DIAGNOSIS — M5134 Other intervertebral disc degeneration, thoracic region: Secondary | ICD-10-CM | POA: Insufficient documentation

## 2018-01-19 DIAGNOSIS — I10 Essential (primary) hypertension: Secondary | ICD-10-CM | POA: Insufficient documentation

## 2018-01-19 MED ORDER — METHOCARBAMOL 750 MG PO TABS
750.0000 mg | ORAL_TABLET | Freq: Two times a day (BID) | ORAL | 1 refills | Status: DC | PRN
Start: 2018-01-19 — End: 2018-11-29

## 2018-01-19 MED ORDER — MELOXICAM 7.5 MG PO TABS: 7.5000 mg | ORAL_TABLET | Freq: Every day | ORAL | 1 refills | Status: DC

## 2018-01-19 MED FILL — HYDROCORTISONE ACE-PRAMOXIN: 2.5-1 | 10 days supply | Qty: 30 | Fill #1

## 2018-01-19 MED FILL — METHOCARBAMOL 750 MG TABS: 750 | 30 days supply | Qty: 60 | Fill #0

## 2018-01-19 MED FILL — MELOXICAM 7.5 MG TABLET: 7.5 | 30 days supply | Qty: 30 | Fill #1

## 2018-01-19 NOTE — Progress Notes (Signed)
Patient ID: Aaron Dennis, male   DOB: August 21, 1955, 62 y.o.   MRN: [id number]   Aaron Dennis, is a 62 y.o. male  XLK:[id number]  [id number]  DOB - 1955-03-25  Subjective:  Chief Complaint and HPI: Aaron Dennis is a 62 y.o. male here today for follow up on neck , upper back, and lower back pain.  He has tried meloxicam, methocarbamol and PT with no relief.  Previous chest CT showed possible lytic lesions on the spine.  Abnormal Lumbar xrays recommending MRI.  No new problems.  Just continued pain.  ROS:   Constitutional:  No f/c, No night sweats, No unexplained weight loss. EENT:  No vision changes, No blurry vision, No hearing changes. No mouth, throat, or ear problems.  Respiratory: No cough, No SOB Cardiac: No CP, no palpitations GI:  No abd pain, No N/V/D. GU: No Urinary s/sx Musculoskeletal: No joint pain Neuro: No headache, no dizziness, no motor weakness.  Skin: No rash Endocrine:  No polydipsia. No polyuria.  Psych: Denies SI/HI  No problems updated.  ALLERGIES: No Known Allergies  PAST MEDICAL HISTORY: Past Medical History:  Diagnosis Date  . GERD (gastroesophageal reflux disease)   . Hypertension   . Lung nodule    7 mm LUL nodule    MEDICATIONS AT HOME: Prior to Admission medications   Medication Sig Start Date End Date Taking? Authorizing Provider  amLODipine (NORVASC) 5 MG tablet Take 1 tablet (5 mg total) by mouth daily. 11/09/17   Charlott Rakes, MD  diclofenac sodium (VOLTAREN) 1 % GEL Apply 2 g topically 4 (four) times daily. Rub into affected area of foot 2 to 4 times daily 11/26/17   Trula Slade, DPM  hydrocortisone-pramoxine (ANALPRAM HC) 2.5-1 % rectal cream Place 1 application rectally 3 (three) times daily. 11/09/17   Charlott Rakes, MD  meloxicam (MOBIC) 7.5 MG tablet Take 1 tablet (7.5 mg total) by mouth daily. 11/09/17   Charlott Rakes, MD  methocarbamol (ROBAXIN-750) 750 MG tablet Take 1 tablet (750 mg total) by mouth 2 (two) times daily as  needed for muscle spasms. 11/09/17   Charlott Rakes, MD  omeprazole (PRILOSEC) 40 MG capsule Take 1 capsule (40 mg total) by mouth daily. 11/09/17   Charlott Rakes, MD     Objective:  EXAM:   Vitals:   01/19/18 1345  BP: (!) 158/94  Pulse: 82  Resp: 16  Temp: 98.2 F (36.8 C)  TempSrc: Oral  SpO2: 97%  Weight: 190 lb 9.6 oz (86.5 kg)    General appearance : A&OX3. NAD. Non-toxic-appearing HEENT: Atraumatic and Normocephalic.  PERRLA. EOM intact.  Neck: supple, no JVD. No cervical lymphadenopathy. No thyromegaly Chest/Lungs:  Breathing-non-labored, Good air entry bilaterally, breath sounds normal without rales, rhonchi, or wheezing  CVS: S1 S2 regular, no murmurs, gallops, rubs  c-spine, t-spine, L-s spine-no bony TTP-ROM ~80% of normal.  + paraspinus muscle spasms.  DTR U/L extremity=intact B. Extremities: Bilateral Lower Ext shows no edema, both legs are warm to touch with = pulse throughout.  R shoulder ligaments stable w/o crepitus.   Neurology:  CN II-XII grossly intact, Non focal.   Psych:  TP linear. J/I WNL. Normal speech. Appropriate eye contact and affect.  Skin:  No Rash  Data Review Lab Results  Component Value Date   HGBA1C 5.2 04/08/2017     Assessment & Plan   1. Other intervertebral disc degeneration, thoracic region - MR Thoracic Spine Wo Contrast; Future - MR Cervical Spine Wo Contrast; Future  2. DDD (degenerative disc disease), thoracolumbar - MR Thoracic Spine Wo Contrast; Future - MR Lumbar Spine Wo Contrast; Future  3. Neck pain RF meloxicam and methocarbamol  4. Abnormal x-ray MRI  5. Abnormal CT scan With lytic lesions?/bony abnormality.   - MR Thoracic Spine Wo Contrast; Future - MR Lumbar Spine Wo Contrast; Future  6. Chronic right shoulder pain - DG Shoulder Right; Future   Patient have been counseled extensively about nutrition and exercise  Return in about 3 weeks (around 02/09/2018) for f/up with Dr Margarita Rana for back  problems.  The patient was given clear instructions to go to ER or return to medical center if symptoms don't improve, worsen or new problems develop. The patient verbalized understanding. The patient was told to call to get lab results if they haven't heard anything in the next week.     Freeman Caldron, PA-C [facility] and [place], [place]   01/19/2018, 2:05 PM

## 2018-01-19 NOTE — Progress Notes (Signed)
158 

## 2018-02-01 ENCOUNTER — Ambulatory Visit (HOSPITAL_COMMUNITY)
Admission: RE | Admit: 2018-02-01 | Discharge: 2018-02-01 | Disposition: A | Discharge status: Home / Self Care | Payer: Self-pay | Source: Ambulatory Visit | Attending: Physician Assistant | Admitting: Physician Assistant

## 2018-02-01 ENCOUNTER — Ambulatory Visit (HOSPITAL_COMMUNITY): Payer: Self-pay

## 2018-02-01 ENCOUNTER — Ambulatory Visit (HOSPITAL_COMMUNITY): Admission: RE | Admit: 2018-02-01 | Payer: Self-pay | Source: Ambulatory Visit

## 2018-02-01 DIAGNOSIS — M5021 Other cervical disc displacement,  high cervical region: Secondary | ICD-10-CM | POA: Insufficient documentation

## 2018-02-01 DIAGNOSIS — M8938 Hypertrophy of bone, other site: Secondary | ICD-10-CM | POA: Insufficient documentation

## 2018-02-01 DIAGNOSIS — M5134 Other intervertebral disc degeneration, thoracic region: Secondary | ICD-10-CM | POA: Insufficient documentation

## 2018-02-01 DIAGNOSIS — R9389 Abnormal findings on diagnostic imaging of other specified body structures: Secondary | ICD-10-CM | POA: Insufficient documentation

## 2018-02-01 DIAGNOSIS — M2578 Osteophyte, vertebrae: Secondary | ICD-10-CM | POA: Insufficient documentation

## 2018-02-01 DIAGNOSIS — M5127 Other intervertebral disc displacement, lumbosacral region: Secondary | ICD-10-CM | POA: Insufficient documentation

## 2018-02-01 DIAGNOSIS — M5126 Other intervertebral disc displacement, lumbar region: Secondary | ICD-10-CM | POA: Insufficient documentation

## 2018-02-01 DIAGNOSIS — M4804 Spinal stenosis, thoracic region: Secondary | ICD-10-CM | POA: Insufficient documentation

## 2018-02-01 DIAGNOSIS — M25511 Pain in right shoulder: Secondary | ICD-10-CM | POA: Insufficient documentation

## 2018-02-01 DIAGNOSIS — M5124 Other intervertebral disc displacement, thoracic region: Secondary | ICD-10-CM | POA: Insufficient documentation

## 2018-02-01 DIAGNOSIS — M4802 Spinal stenosis, cervical region: Secondary | ICD-10-CM | POA: Insufficient documentation

## 2018-02-01 DIAGNOSIS — G8929 Other chronic pain: Secondary | ICD-10-CM | POA: Insufficient documentation

## 2018-02-01 DIAGNOSIS — M48061 Spinal stenosis, lumbar region without neurogenic claudication: Secondary | ICD-10-CM | POA: Insufficient documentation

## 2018-02-01 DIAGNOSIS — M5135 Other intervertebral disc degeneration, thoracolumbar region: Secondary | ICD-10-CM

## 2018-02-03 ENCOUNTER — Other Ambulatory Visit: Payer: Self-pay | Admitting: Physician Assistant

## 2018-02-03 DIAGNOSIS — R9389 Abnormal findings on diagnostic imaging of other specified body structures: Secondary | ICD-10-CM

## 2018-02-08 ENCOUNTER — Telehealth: Payer: Self-pay

## 2018-02-08 NOTE — Telephone Encounter (Signed)
Patient was called and informed of MRI results and xray results.

## 2018-02-15 ENCOUNTER — Ambulatory Visit: Payer: Self-pay | Attending: Internal Medicine | Admitting: Internal Medicine

## 2018-02-15 ENCOUNTER — Encounter: Payer: Self-pay | Admitting: Internal Medicine

## 2018-02-15 VITALS — BP 147/95 | HR 82 | Temp 98.1°F | Resp 16 | Ht 66.0 in | Wt 188.8 lb

## 2018-02-15 DIAGNOSIS — M47812 Spondylosis without myelopathy or radiculopathy, cervical region: Secondary | ICD-10-CM | POA: Insufficient documentation

## 2018-02-15 DIAGNOSIS — M5020 Other cervical disc displacement, unspecified cervical region: Secondary | ICD-10-CM | POA: Insufficient documentation

## 2018-02-15 DIAGNOSIS — M5126 Other intervertebral disc displacement, lumbar region: Secondary | ICD-10-CM | POA: Insufficient documentation

## 2018-02-15 DIAGNOSIS — Z833 Family history of diabetes mellitus: Secondary | ICD-10-CM | POA: Insufficient documentation

## 2018-02-15 DIAGNOSIS — Z87891 Personal history of nicotine dependence: Secondary | ICD-10-CM | POA: Insufficient documentation

## 2018-02-15 DIAGNOSIS — I2510 Atherosclerotic heart disease of native coronary artery without angina pectoris: Secondary | ICD-10-CM | POA: Insufficient documentation

## 2018-02-15 DIAGNOSIS — M5124 Other intervertebral disc displacement, thoracic region: Secondary | ICD-10-CM | POA: Insufficient documentation

## 2018-02-15 DIAGNOSIS — I10 Essential (primary) hypertension: Secondary | ICD-10-CM | POA: Insufficient documentation

## 2018-02-15 DIAGNOSIS — Z791 Long term (current) use of non-steroidal anti-inflammatories (NSAID): Secondary | ICD-10-CM | POA: Insufficient documentation

## 2018-02-15 DIAGNOSIS — K219 Gastro-esophageal reflux disease without esophagitis: Secondary | ICD-10-CM | POA: Insufficient documentation

## 2018-02-15 DIAGNOSIS — Z79899 Other long term (current) drug therapy: Secondary | ICD-10-CM | POA: Insufficient documentation

## 2018-02-15 MED FILL — ?AMLODIPINE BESYLATE 5 MG T: 5 | 30 days supply | Qty: 30 | Fill #2

## 2018-02-15 NOTE — Progress Notes (Signed)
Patient ID: Aaron Dennis, male    DOB: [birth year]  MRN: [id number]  CC: Results (MRI)   Subjective: Aaron Dennis is a 63 y.o. male who presents for UC visit.  PCP is Dr. Margarita Rana His concerns today include:  63 year old male with a history of hypertension, polycythemia (s/p phlebotomy in the past), chronic thoracolumbar pain, GERD.    Patient presents today wanting to discuss the results of recent MRI of the cervical, thoracic and lumbar spine.  These were ordered by PA due to complaint of chronic pain in the neck and back.  He did receive a phone call from the the [facility] with the results but he wanted it to be explained in person.  This revealed some protruding disks at all levels and some arthritis changes.  Incidental findings of hemangiomas also noted.  I went over the results with the patient.  He was referred to neurosurgeon by the PA.  He awaits appointment. Patient Active Problem List   Diagnosis Date Noted  . Flat foot 11/28/2017  . Tendonitis of foot 11/28/2017  . Polycythemia 05/12/2017  . Vitamin D deficiency 04/21/2017  . Bony sclerosis 08/05/2016  . Lung nodule   . Chronic pain of both knees 12/05/2015  . Gastroesophageal reflux disease without esophagitis 05/17/2014  . Essential hypertension 05/17/2014  . Coronary artery calcification 08/17/2012  . Chronic abdominal pain 06/24/2012  . Right-sided chest wall pain 06/24/2012  . Constipation, chronic 06/24/2012  . Hyperbilirubinemia 06/24/2012  . Chronic back pain 06/24/2012  . DDD (degenerative disc disease), thoracolumbar 06/24/2012  . History of nephrolithiasis 06/24/2012  . Side pain 06/24/2012  . Low back pain at multiple sites 06/24/2012     Current Outpatient Medications on File Prior to Visit  Medication Sig Dispense Refill  . amLODipine (NORVASC) 5 MG tablet Take 1 tablet (5 mg total) by mouth daily. 90 tablet 1  . diclofenac sodium (VOLTAREN) 1 % GEL Apply 2 g topically 4 (four) times daily. Rub into affected  area of foot 2 to 4 times daily 100 g 2  . hydrocortisone-pramoxine (ANALPRAM HC) 2.5-1 % rectal cream Place 1 application rectally 3 (three) times daily. 30 g 3  . meloxicam (MOBIC) 7.5 MG tablet Take 1 tablet (7.5 mg total) by mouth daily. 30 tablet 1  . methocarbamol (ROBAXIN-750) 750 MG tablet Take 1 tablet (750 mg total) by mouth 2 (two) times daily as needed for muscle spasms. 180 tablet 1  . omeprazole (PRILOSEC) 40 MG capsule Take 1 capsule (40 mg total) by mouth daily. 90 capsule 1   Current Facility-Administered Medications on File Prior to Visit  Medication Dose Route Frequency Provider Last Rate Last Dose  . 0.9 %  sodium chloride infusion  500 mL Intravenous Once Jackquline Denmark, MD        No Known Allergies  Social History   Socioeconomic History  . Marital status: Married    Spouse name: Not on file  . Number of children: Not on file  . Years of education: Not on file  . Highest education level: Not on file  Occupational History  . Not on file  Social Needs  . Financial resource strain: Not on file  . Food insecurity:    Worry: Not on file    Inability: Not on file  . Transportation needs:    Medical: Not on file    Non-medical: Not on file  Tobacco Use  . Smoking status: Former Research scientist (life sciences)  . Smokeless tobacco: Never Used  .  Tobacco comment: quit Jan 2018  Substance and Sexual Activity  . Alcohol use: No  . Drug use: No  . Sexual activity: Not on file  Lifestyle  . Physical activity:    Days per week: Not on file    Minutes per session: Not on file  . Stress: Not on file  Relationships  . Social connections:    Talks on phone: Not on file    Gets together: Not on file    Attends religious service: Not on file    Active member of club or organization: Not on file    Attends meetings of clubs or organizations: Not on file    Relationship status: Not on file  . Intimate partner violence:    Fear of current or ex partner: Not on file    Emotionally abused:  Not on file    Physically abused: Not on file    Forced sexual activity: Not on file  Other Topics Concern  . Not on file  Social History Narrative  . Not on file    Family History  Problem Relation Age of Onset  . Diabetes Brother   . Colon polyps Neg Hx   . Colon cancer Neg Hx   . Esophageal cancer Neg Hx   . Rectal cancer Neg Hx   . Stomach cancer Neg Hx     No past surgical history on file.  ROS: Review of Systems Negative except as above. PHYSICAL EXAM: BP (!) 147/95   Pulse 82   Temp 98.1 F (36.7 C) (Oral)   Resp 16   Ht 5\' 6"  (1.676 m)   Wt 188 lb 12.8 oz (85.6 kg)   SpO2 98%   BMI 30.47 kg/m   Physical Exam   General appearance - alert, well appearing, older male and in no distress Mental status - normal mood, behavior, speech, dress, motor activity, and thought processes   ASSESSMENT AND PLAN: 1. Protruded cervical disc 2. Protrusion of thoracic intervertebral disc 3. Protrusion of lumbar intervertebral disc 4. Osteoarthritis of cervical spine, unspecified spinal osteoarthritis complication status  I went over results with the patient describing what is meant by protruding disc through use of drawings.  Patient wanted to know whether he would need to have surgery.  I informed him that he would need to discuss his symptoms with the specialist who will also look at the results of the imaging studies and will come up with a plan for him.    Patient was given the opportunity to ask questions.  Patient verbalized understanding of the plan and was able to repeat key elements of the plan.   No orders of the defined types were placed in this encounter.    Requested Prescriptions    No prescriptions requested or ordered in this encounter    No follow-ups on file.  Karle Plumber, MD, FACP

## 2018-02-25 MED FILL — HYDROCORTISONE ACE-PRAMOXIN: 2.5-1 | 10 days supply | Qty: 30 | Fill #2

## 2018-02-28 ENCOUNTER — Ambulatory Visit: Payer: Self-pay

## 2018-03-09 ENCOUNTER — Telehealth: Payer: Self-pay | Admitting: Family Medicine

## 2018-03-09 ENCOUNTER — Other Ambulatory Visit: Payer: Self-pay

## 2018-03-09 DIAGNOSIS — K648 Other hemorrhoids: Secondary | ICD-10-CM

## 2018-03-09 DIAGNOSIS — K219 Gastro-esophageal reflux disease without esophagitis: Secondary | ICD-10-CM

## 2018-03-09 DIAGNOSIS — I10 Essential (primary) hypertension: Secondary | ICD-10-CM

## 2018-03-09 MED ORDER — HYDROCORTISONE ACE-PRAMOXINE 2.5-1 % RE CREA: 1.0000 "application " | TOPICAL_CREAM | Freq: Three times a day (TID) | RECTAL | 3 refills | Status: DC

## 2018-03-09 MED ORDER — OMEPRAZOLE 40 MG PO CPDR: 40.0000 mg | DELAYED_RELEASE_CAPSULE | Freq: Every day | ORAL | 1 refills | Status: DC

## 2018-03-09 MED ORDER — AMLODIPINE BESYLATE 5 MG PO TABS: 5.0000 mg | ORAL_TABLET | Freq: Every day | ORAL | 1 refills | Status: DC

## 2018-03-09 MED FILL — OMEPRAZOLE DR 40 MG CAPSULE: 40 | 90 days supply | Qty: 90 | Fill #0

## 2018-03-09 MED FILL — AMLODIPINE BESYLATE 5 MG TA: 5 | 90 days supply | Qty: 90 | Fill #0

## 2018-03-09 MED FILL — HYDROCORTISONE ACE-PRAMOXIN: 2.5-1 | 10 days supply | Qty: 30 | Fill #0

## 2018-03-09 NOTE — Telephone Encounter (Signed)
1) Medication(s) Requested (by name): BP MED, OMIPRAZOLE, HYDROCORTISONE ACETATE 2.5% PRAMOXINE HCL 1% CREAM 2.5%.  Pt request three month supply  2) Pharmacy of Choice: [facility]  3) Special Requests: Pt waiting.   Approved medications will be sent to the pharmacy, we will reach out if there is an issue.  Requests made after 3pm may not be addressed until the following business day!  If a patient is unsure of the name of the medication(s) please note and ask patient to call back when they are able to provide all info, do not send to responsible party until all information is available!

## 2018-03-09 NOTE — Telephone Encounter (Signed)
Can you fill this for me please

## 2018-03-10 NOTE — Telephone Encounter (Signed)
Refills have already been sent, no further action is required.

## 2018-03-11 ENCOUNTER — Other Ambulatory Visit: Payer: Self-pay | Admitting: Thoracic Surgery (Cardiothoracic Vascular Surgery)

## 2018-03-11 DIAGNOSIS — R911 Solitary pulmonary nodule: Secondary | ICD-10-CM

## 2018-03-14 ENCOUNTER — Ambulatory Visit: Payer: Self-pay | Admitting: Internal Medicine

## 2018-03-14 ENCOUNTER — Other Ambulatory Visit: Payer: Self-pay

## 2018-03-30 ENCOUNTER — Ambulatory Visit: Payer: Self-pay | Admitting: Family Medicine

## 2018-03-30 ENCOUNTER — Encounter

## 2018-04-21 ENCOUNTER — Other Ambulatory Visit: Payer: Self-pay

## 2018-04-26 ENCOUNTER — Ambulatory Visit: Payer: Self-pay | Admitting: Thoracic Surgery (Cardiothoracic Vascular Surgery)

## 2018-04-27 ENCOUNTER — Encounter: Payer: Self-pay | Admitting: Thoracic Surgery (Cardiothoracic Vascular Surgery)

## 2018-07-06 MED FILL — ?AMLODIPINE BESYLATE 5MG TA: 5 | 90 days supply | Qty: 90 | Fill #1

## 2018-07-06 MED FILL — OMEPRAZOLE DR 40 MG CAPSULE: 40 | 90 days supply | Qty: 90 | Fill #1

## 2018-09-28 ENCOUNTER — Other Ambulatory Visit: Payer: Self-pay

## 2018-09-28 ENCOUNTER — Emergency Department (HOSPITAL_COMMUNITY): Payer: Self-pay

## 2018-09-28 ENCOUNTER — Encounter (HOSPITAL_COMMUNITY): Payer: Self-pay

## 2018-09-28 ENCOUNTER — Emergency Department (HOSPITAL_COMMUNITY)
Admission: EM | Admit: 2018-09-28 | Discharge: 2018-09-28 | Disposition: A | Discharge status: Home / Self Care | Payer: Self-pay | Attending: Emergency Medicine | Admitting: Emergency Medicine

## 2018-09-28 DIAGNOSIS — Z87891 Personal history of nicotine dependence: Secondary | ICD-10-CM | POA: Insufficient documentation

## 2018-09-28 DIAGNOSIS — I10 Essential (primary) hypertension: Secondary | ICD-10-CM | POA: Insufficient documentation

## 2018-09-28 DIAGNOSIS — Y9389 Activity, other specified: Secondary | ICD-10-CM | POA: Insufficient documentation

## 2018-09-28 DIAGNOSIS — S90211A Contusion of right great toe with damage to nail, initial encounter: Secondary | ICD-10-CM

## 2018-09-28 DIAGNOSIS — Z79899 Other long term (current) drug therapy: Secondary | ICD-10-CM | POA: Insufficient documentation

## 2018-09-28 DIAGNOSIS — Y929 Unspecified place or not applicable: Secondary | ICD-10-CM | POA: Insufficient documentation

## 2018-09-28 DIAGNOSIS — Y999 Unspecified external cause status: Secondary | ICD-10-CM | POA: Insufficient documentation

## 2018-09-28 DIAGNOSIS — W228XXA Striking against or struck by other objects, initial encounter: Secondary | ICD-10-CM | POA: Insufficient documentation

## 2018-09-28 MED ORDER — IBUPROFEN 200 MG PO TABS
400.0000 mg | ORAL_TABLET | Freq: Once | ORAL | Status: AC
  Administered 2018-09-28: 400 mg via ORAL
  Filled 2018-09-28: qty 2

## 2018-09-28 MED ORDER — TRAMADOL HCL 50 MG PO TABS: 50.0000 mg | ORAL_TABLET | Freq: Four times a day (QID) | ORAL | 0 refills | Status: DC | PRN

## 2018-09-28 MED ORDER — ACETAMINOPHEN 500 MG PO TABS
1000.0000 mg | ORAL_TABLET | Freq: Once | ORAL | Status: AC
  Administered 2018-09-28: 1000 mg via ORAL
  Filled 2018-09-28: qty 2

## 2018-09-28 NOTE — ED Triage Notes (Signed)
Patient reports that he was opening the door and the caught his right great toe. Patient states the toenail is loose. Bleeding controlled. Dressing present.

## 2018-09-28 NOTE — ED Provider Notes (Signed)
[facility] DEPT Provider Note   CSN: [id number] Arrival date & time: 09/28/18  1746     History   Chief Complaint Chief Complaint  Patient presents with  . Extremity Laceration    HPI Aaron Dennis is a 63 y.o. male.     HPI Patient presents with right great toe injury.  This evening states he was opening the door and struck his toe.  The great toenail was pulled back and he thinks that it is now loose.  There is a small amount of blood around the nail.  Denies any sensory changes. Past Medical History:  Diagnosis Date  . GERD (gastroesophageal reflux disease)   . Hypertension   . Lung nodule    7 mm LUL nodule    Patient Active Problem List   Diagnosis Date Noted  . Flat foot 11/28/2017  . Tendonitis of foot 11/28/2017  . Polycythemia 05/12/2017  . Vitamin D deficiency 04/21/2017  . Bony sclerosis 08/05/2016  . Lung nodule   . Chronic pain of both knees 12/05/2015  . Gastroesophageal reflux disease without esophagitis 05/17/2014  . Essential hypertension 05/17/2014  . Coronary artery calcification 08/17/2012  . Chronic abdominal pain 06/24/2012  . Right-sided chest wall pain 06/24/2012  . Constipation, chronic 06/24/2012  . Hyperbilirubinemia 06/24/2012  . Chronic back pain 06/24/2012  . DDD (degenerative disc disease), thoracolumbar 06/24/2012  . History of nephrolithiasis 06/24/2012  . Side pain 06/24/2012  . Low back pain at multiple sites 06/24/2012    History reviewed. No pertinent surgical history.      Home Medications    Prior to Admission medications   Medication Sig Start Date End Date Taking? Authorizing Provider  amLODipine (NORVASC) 5 MG tablet Take 1 tablet (5 mg total) by mouth daily. 03/09/18   Charlott Rakes, MD  diclofenac sodium (VOLTAREN) 1 % GEL Apply 2 g topically 4 (four) times daily. Rub into affected area of foot 2 to 4 times daily 11/26/17   Trula Slade, DPM  hydrocortisone-pramoxine  (ANALPRAM HC) 2.5-1 % rectal cream Place 1 application rectally 3 (three) times daily. 03/09/18   Charlott Rakes, MD  meloxicam (MOBIC) 7.5 MG tablet Take 1 tablet (7.5 mg total) by mouth daily. 01/19/18   Argentina Donovan, PA-C  methocarbamol (ROBAXIN-750) 750 MG tablet Take 1 tablet (750 mg total) by mouth 2 (two) times daily as needed for muscle spasms. 01/19/18   Argentina Donovan, PA-C  omeprazole (PRILOSEC) 40 MG capsule Take 1 capsule (40 mg total) by mouth daily. 03/09/18   Charlott Rakes, MD  traMADol (ULTRAM) 50 MG tablet Take 1 tablet (50 mg total) by mouth every 6 (six) hours as needed for moderate pain. 09/28/18   Julianne Rice, MD    Family History Family History  Problem Relation Age of Onset  . Healthy Mother   . Diabetes Brother   . Colon polyps Neg Hx   . Colon cancer Neg Hx   . Esophageal cancer Neg Hx   . Rectal cancer Neg Hx   . Stomach cancer Neg Hx     Social History Social History   Tobacco Use  . Smoking status: Former Research scientist (life sciences)  . Smokeless tobacco: Never Used  . Tobacco comment: quit Jan 2018  Substance Use Topics  . Alcohol use: No  . Drug use: No     Allergies   Patient has no known allergies.   Review of Systems Review of Systems  Musculoskeletal: Positive for arthralgias.  Skin: Positive for wound.  Neurological: Negative for weakness and numbness.  All other systems reviewed and are negative.    Physical Exam Updated Vital Signs BP 140/90 (BP Location: Left Arm)   Pulse 92   Temp 98.1 F (36.7 C) (Oral)   Resp 15   Ht 5\' 6"  (1.676 m)   Wt 85.7 kg   SpO2 99%   BMI 30.51 kg/m   Physical Exam Vitals signs and nursing note reviewed.  Constitutional:      Appearance: Normal appearance. He is well-developed.  HENT:     Head: Normocephalic and atraumatic.  Eyes:     Pupils: Pupils are equal, round, and reactive to light.  Neck:     Musculoskeletal: Normal range of motion and neck supple.  Cardiovascular:     Rate and  Rhythm: Normal rate.  Pulmonary:     Effort: Pulmonary effort is normal.  Abdominal:     Palpations: Abdomen is soft.  Musculoskeletal: Normal range of motion.        General: Tenderness present.     Comments: Tenderness to palpation over the distal right great toe.  Nail is intact.  There is no significant subungual hematoma present.  No active bleeding.  No laceration identified.  Good distal cap refill.  Skin:    General: Skin is warm and dry.     Findings: No erythema or rash.  Neurological:     General: No focal deficit present.     Mental Status: He is alert and oriented to person, place, and time.     Comments: Sensation to light touch intact.  Moving all extremities without deficit.  Psychiatric:        Behavior: Behavior normal.      ED Treatments / Results  Labs (all labs ordered are listed, but only abnormal results are displayed) Labs Reviewed - No data to display  EKG None  Radiology No results found.  Procedures Procedures (including critical care time)  Medications Ordered in ED Medications  ibuprofen (ADVIL) tablet 400 mg (400 mg Oral Given 09/28/18 2101)  acetaminophen (TYLENOL) tablet 1,000 mg (1,000 mg Oral Given 09/28/18 2101)     Initial Impression / Assessment and Plan / ED Course  I have reviewed the triage vital signs and the nursing notes.  Pertinent labs & imaging results that were available during my care of the patient were reviewed by me and considered in my medical decision making (see chart for details).        Will obtain x-ray to rule out fracture.  Have educated patient that he may lose his toenail.  Will keep in place for now.  X-ray without acute findings.  Will treat symptomatically. Final Clinical Impressions(s) / ED Diagnoses   Final diagnoses:  Contusion of right great toe with damage to nail, initial encounter    ED Discharge Orders         Ordered    traMADol (ULTRAM) 50 MG tablet  Every 6 hours PRN     09/28/18  2158           Julianne Rice, MD 09/29/18 1437

## 2018-09-29 MED FILL — traMADol HCL 50 MG TABS: 50 | 2 days supply | Qty: 10 | Fill #0

## 2018-10-03 ENCOUNTER — Ambulatory Visit: Payer: Self-pay | Attending: Family Medicine

## 2018-10-03 ENCOUNTER — Other Ambulatory Visit: Payer: Self-pay

## 2018-11-07 MED FILL — OMEPRAZOLE DR 40 MG CAPSULE: 40 | 30 days supply | Qty: 30 | Fill #2

## 2018-11-07 MED FILL — HYDROCORTISONE ACE-PRAMOXIN: 2.5-1 | 10 days supply | Qty: 30 | Fill #1

## 2018-11-07 MED FILL — AMLODIPINE BESYLATE 5 MG TA: 5 | 30 days supply | Qty: 30 | Fill #3

## 2018-11-24 ENCOUNTER — Telehealth: Payer: Self-pay | Admitting: Family Medicine

## 2018-11-24 NOTE — Telephone Encounter (Signed)
Patient is trying to unemployment and needs a leeter stating he has high BP

## 2018-11-29 ENCOUNTER — Other Ambulatory Visit: Payer: Self-pay

## 2018-11-29 ENCOUNTER — Encounter: Payer: Self-pay | Admitting: Family Medicine

## 2018-11-29 ENCOUNTER — Ambulatory Visit: Payer: Self-pay | Attending: Family Medicine | Admitting: Family Medicine

## 2018-11-29 VITALS — BP 130/80 | HR 81 | Temp 98.5°F | Ht 66.0 in | Wt 190.0 lb

## 2018-11-29 DIAGNOSIS — I10 Essential (primary) hypertension: Secondary | ICD-10-CM

## 2018-11-29 DIAGNOSIS — M5135 Other intervertebral disc degeneration, thoracolumbar region: Secondary | ICD-10-CM

## 2018-11-29 DIAGNOSIS — Z131 Encounter for screening for diabetes mellitus: Secondary | ICD-10-CM

## 2018-11-29 DIAGNOSIS — G8929 Other chronic pain: Secondary | ICD-10-CM

## 2018-11-29 DIAGNOSIS — L989 Disorder of the skin and subcutaneous tissue, unspecified: Secondary | ICD-10-CM

## 2018-11-29 LAB — POCT GLYCOSYLATED HEMOGLOBIN (HGB A1C): HbA1c, POC (prediabetic range): 5.4 % — AB (ref 5.7–6.4)

## 2018-11-29 MED ORDER — TRAMADOL HCL 50 MG PO TABS: 50.0000 mg | ORAL_TABLET | Freq: Two times a day (BID) | ORAL | 1 refills | Status: DC | PRN

## 2018-11-29 MED ORDER — AMLODIPINE BESYLATE 5 MG PO TABS: 5.0000 mg | ORAL_TABLET | Freq: Every day | ORAL | 1 refills | Status: DC

## 2018-11-29 MED ORDER — METHOCARBAMOL 750 MG PO TABS: 750.0000 mg | ORAL_TABLET | Freq: Two times a day (BID) | ORAL | 1 refills | Status: DC | PRN

## 2018-11-29 MED ORDER — MELOXICAM 7.5 MG PO TABS: 7.5000 mg | ORAL_TABLET | Freq: Every day | ORAL | 3 refills | Status: DC

## 2018-11-29 MED FILL — MELOXICAM 7.5 MG TABLET: 7.5 | 30 days supply | Qty: 30 | Fill #0

## 2018-11-29 MED FILL — traMADol HCL 50 MG TABS: 50 | 20 days supply | Qty: 40 | Fill #0

## 2018-11-29 MED FILL — ?METHOCARBAMOL 750 MG TABLE: 750 | 30 days supply | Qty: 60 | Fill #0

## 2018-11-29 MED FILL — ?AMLODIPINE BESYLATE 5MG TA: 5 | 30 days supply | Qty: 30 | Fill #0

## 2018-11-29 NOTE — Progress Notes (Signed)
Patient is having pain on right side of body and can not sleep at night.  Patient is having pain in knees and shoulder.  Patient has small rash on back of left thigh.  Patient needs a letter stating his medical conditions for unemployment.

## 2018-11-29 NOTE — Telephone Encounter (Signed)
Patient has an appointment to discuss matter.

## 2018-11-29 NOTE — Patient Instructions (Signed)
Muscle Cramps and Spasms Muscle cramps and spasms are when muscles tighten by themselves. They usually get better within minutes. Muscle cramps are painful. They are usually stronger and last longer than muscle spasms. Muscle spasms may or may not be painful. They can last a few seconds or much longer. Cramps and spasms can affect any muscle, but they occur most often in the calf muscles of the leg. They are usually not caused by a serious problem. In many cases, the cause is not known. Some common causes include:  Doing more physical work or exercise than your body is ready for.  Using the muscles too much (overuse) by repeating certain movements too many times.  Staying in a certain position for a long time.  Playing a sport or doing an activity without preparing properly.  Using bad form or technique while playing a sport or doing an activity.  Not having enough water in your body (dehydration).  Injury.  Side effects of some medicines.  Low levels of the salts and minerals in your blood (electrolytes), such as low potassium or calcium. Follow these instructions at home: Managing pain and stiffness      Massage, stretch, and relax the muscle. Do this for many minutes at a time.  If told, put heat on tight or tense muscles as often as told by your doctor. Use the heat source that your doctor recommends, such as a moist heat pack or a heating pad. ? Place a towel between your skin and the heat source. ? Leave the heat on for 20-30 minutes. ? Remove the heat if your skin turns bright red. This is very important if you are not able to feel pain, heat, or cold. You may have a greater risk of getting burned.  If told, put ice on the affected area. This may help if you are sore or have pain after a cramp or spasm. ? Put ice in a plastic bag. ? Place a towel between your skin and the bag. ? Leave the ice on for 20 minutes, 2-3 times a day.  Try taking hot showers or baths to help  relax tight muscles. Eating and drinking  Drink enough fluid to keep your pee (urine) pale yellow.  Eat a healthy diet to help ensure that your muscles work well. This should include: ? Fruits and vegetables. ? Lean protein. ? Whole grains. ? Low-fat or nonfat dairy products. General instructions  If you are having cramps often, avoid intense exercise for several days.  Take over-the-counter and prescription medicines only as told by your doctor.  Watch for any changes in your symptoms.  Keep all follow-up visits as told by your doctor. This is important. Contact a doctor if:  Your cramps or spasms get worse or happen more often.  Your cramps or spasms do not get better with time. Summary  Muscle cramps and spasms are when muscles tighten by themselves. They usually get better within minutes.  Cramps and spasms occur most often in the calf muscles of the leg.  Massage, stretch, and relax the muscle. This may help the cramp or spasm go away.  Drink enough fluid to keep your pee (urine) pale yellow. This information is not intended to replace advice given to you by your health care provider. Make sure you discuss any questions you have with your health care provider. Document Released: 01/09/2008 Document Revised: 06/21/2017 Document Reviewed: 06/21/2017 [employer]vier Patient Education  2020 [employer]  

## 2018-11-29 NOTE — Progress Notes (Signed)
Subjective:  Patient ID: Aaron Dennis, male    DOB: 07/19/1955  Age: 63 y.o. MRN: [id number]  CC: Hypertension   HPI Aaron Dennis is a 63 year old male with a history of hypertension, polycythemia (s/p phlebotomy in the past), chronic thoracolumbar pain, GERD here for a follow-up visit. He presents today complaining of right-sided back pain which is worse than previously and rated as an 8/10.  He had seen a neurosurgeon and states " Dr. Arnoldo Morale did not do anything for me because I do not have Medicaid" Pain does not radiate down his lower extremities and he denies paresthesias or falls. MRI from 01/2018 revealed: MRI THORACIC SPINE IMPRESSION:  1. Right paracentral disc protrusion at T7-8 with secondary mild flattening of the right hemi cord. Finding could result in right-sided radicular symptoms. 2. Central disc protrusion at T6-7 with resultant mild cord flattening. 3. Multilevel facet hypertrophy throughout the thoracic spine as detailed above, which could contribute to ongoing back pain. Resultant mild foraminal narrowing on the left at T1-2 and on the right at T4-5. Moderate left with mild right foraminal narrowing at T6-7. 4. Diffusely abnormal appearance of the bone marrow, discussed above in the cervical spine portion of this report.  MRI LUMBAR SPINE IMPRESSION:  1. Mild disc bulging with facet hypertrophy at L4-5 with resultant mild canal with left greater than right lateral recess narrowing, with mild to moderate left greater than right L4 foraminal stenosis. 2. Mild noncompressive disc bulging at L1-2 and L5-S1 without significant stenosis or neural impingement. 3. Abnormal appearance of the bone marrow, discussed above in the cervical spine portion of this report.  He is requesting a letter for unemployment to take to social services stating his medical conditions. Compliant with his antihypertensive and his PPI and denies adverse effects from his medications.  He also complains of knee pain and cracking worse when he rises from a late kneeling position which he has to perform frequently during his prayers. Also complains of a rash on his right gluteal region which has been present for years and has not increased in size.  Past Medical History:  Diagnosis Date  . GERD (gastroesophageal reflux disease)   . Hypertension   . Lung nodule    7 mm LUL nodule    History reviewed. No pertinent surgical history.  Family History  Problem Relation Age of Onset  . Healthy Mother   . Diabetes Brother   . Colon polyps Neg Hx   . Colon cancer Neg Hx   . Esophageal cancer Neg Hx   . Rectal cancer Neg Hx   . Stomach cancer Neg Hx     No Known Allergies  Outpatient Medications Prior to Visit  Medication Sig Dispense Refill  . omeprazole (PRILOSEC) 40 MG capsule Take 1 capsule (40 mg total) by mouth daily. 90 capsule 1  . amLODipine (NORVASC) 5 MG tablet Take 1 tablet (5 mg total) by mouth daily. 90 tablet 1  . diclofenac sodium (VOLTAREN) 1 % GEL Apply 2 g topically 4 (four) times daily. Rub into affected area of foot 2 to 4 times daily (Patient not taking: Reported on 11/29/2018) 100 g 2  . hydrocortisone-pramoxine (ANALPRAM HC) 2.5-1 % rectal cream Place 1 application rectally 3 (three) times daily. (Patient not taking: Reported on 11/29/2018) 30 g 3  . meloxicam (MOBIC) 7.5 MG tablet Take 1 tablet (7.5 mg total) by mouth daily. (Patient not taking: Reported on 11/29/2018) 30 tablet 1  . methocarbamol (ROBAXIN-750) 750  MG tablet Take 1 tablet (750 mg total) by mouth 2 (two) times daily as needed for muscle spasms. (Patient not taking: Reported on 11/29/2018) 180 tablet 1  . traMADol (ULTRAM) 50 MG tablet Take 1 tablet (50 mg total) by mouth every 6 (six) hours as needed for moderate pain. (Patient not taking: Reported on 11/29/2018) 10 tablet 0   Facility-Administered Medications Prior to Visit  Medication Dose Route Frequency Provider Last Rate Last  Dose  . 0.9 %  sodium chloride infusion  500 mL Intravenous Once Jackquline Denmark, MD         ROS Review of Systems  Constitutional: Negative for activity change and appetite change.  HENT: Negative for sinus pressure and sore throat.   Eyes: Negative for visual disturbance.  Respiratory: Negative for cough, chest tightness and shortness of breath.   Cardiovascular: Negative for chest pain and leg swelling.  Gastrointestinal: Negative for abdominal distention, abdominal pain, constipation and diarrhea.  Endocrine: Negative.   Genitourinary: Negative for dysuria.  Musculoskeletal: Negative for joint swelling and myalgias.       See HPI  Skin: Negative for rash.  Allergic/Immunologic: Negative.   Neurological: Negative for weakness, light-headedness and numbness.  Psychiatric/Behavioral: Negative for dysphoric mood and suicidal ideas.    Objective:  BP 130/80   Pulse 81   Temp 98.5 F (36.9 C) (Oral)   Ht _0  (1.676 m)   Wt 190 lb (86.2 kg)   SpO2 97%   BMI 30.67 kg/m   BP/Weight 11/29/2018 0/71/2197 06/17/8323  Systolic BP 498 264 158  Diastolic BP 80 90 95  Wt. (Lbs) 190 189 188.8  BMI 30.67 30.51 30.47      Physical Exam Constitutional:      Appearance: He is well-developed.  Neck:     Vascular: No JVD.  Cardiovascular:     Rate and Rhythm: Normal rate.     Heart sounds: Normal heart sounds. No murmur.  Pulmonary:     Effort: Pulmonary effort is normal.     Breath sounds: Normal breath sounds. No wheezing or rales.  Chest:     Chest wall: No tenderness.  Abdominal:     General: Bowel sounds are normal. There is no distension.     Palpations: Abdomen is soft. There is no mass.     Tenderness: There is no abdominal tenderness.  Musculoskeletal: Normal range of motion.     Right lower leg: No edema.     Left lower leg: No edema.     Comments: Slight right thoracolumbar tenderness worse on twisting motion Negative straight leg raise bilaterally Crepitus on  range of motion of both knees  Skin:    Comments: Subcutaneous nodule in right gluteal region embedded in gluteal muscle.  Nontender  Neurological:     Mental Status: He is alert and oriented to person, place, and time.  Psychiatric:        Mood and Affect: Mood normal.     CMP Latest Ref Rng & Units 07/12/2017 04/28/2017 04/08/2017  Glucose 65 - 99 mg/dL 106(H) 127 77  BUN 6 - 20 mg/dL _1 Creatinine 0.61 - 1.24 mg/dL 0.97 0.92 0.91  Sodium 135 - 145 mmol/L 140 141 142  Potassium 3.5 - 5.1 mmol/L 3.8 3.4(L) 4.0  Chloride 101 - 111 mmol/L 105 107 103  CO2 22 - 32 mmol/L _2 Calcium 8.9 - 10.3 mg/dL 8.8(L) 9.5 9.2  Total Protein 6.5 - 8.1 g/dL 7.3 7.5  7.3  Total Bilirubin 0.3 - 1.2 mg/dL 2.3(H) 1.4(H) 1.4(H)  Alkaline Phos 38 - 126 U/L 137(H) 155(H) 162(H)  AST 15 - 41 U/L _0 ALT 17 - 63 U/L 16(L) 22 20    Lipid Panel     Component Value Date/Time   CHOL 171 04/08/2017 0942   TRIG 91 04/08/2017 0942   HDL 32 (L) 04/08/2017 0942   CHOLHDL 5.3 (H) 04/08/2017 0942   CHOLHDL 6.7 (H) 12/05/2015 1109   VLDL 23 12/05/2015 1109   LDLCALC 121 (H) 04/08/2017 0942    CBC    Component Value Date/Time   WBC 7.3 09/13/2017 1425   WBC 7.6 07/12/2017 1443   RBC 6.12 (H) 09/13/2017 1425   HGB 17.0 09/13/2017 1425   HGB 18.0 (H) 04/08/2017 0942   HGB 16.5 03/13/2009 0931   HCT 51.0 (H) 09/13/2017 1425   HCT 51.9 (H) 04/08/2017 0942   HCT 49.8 03/13/2009 0931   PLT 193 09/13/2017 1425   PLT 234 04/08/2017 0942   MCV 83.3 09/13/2017 1425   MCV 82 04/08/2017 0942   MCV 82.2 03/13/2009 0931   MCH 27.8 09/13/2017 1425   MCHC 33.3 09/13/2017 1425   RDW 14.2 09/13/2017 1425   RDW 15.1 04/08/2017 0942   RDW 15.5 (H) 03/13/2009 0931   LYMPHSABS 1.9 09/13/2017 1425   LYMPHSABS 2.4 04/08/2017 0942   LYMPHSABS 2.2 03/13/2009 0931   MONOABS 0.6 09/13/2017 1425   MONOABS 0.5 03/13/2009 0931   EOSABS 0.1 09/13/2017 1425   EOSABS 0.1 04/08/2017 0942   BASOSABS 0.1  09/13/2017 1425   BASOSABS 0.0 04/08/2017 0942   BASOSABS 0.0 03/13/2009 0931    Lab Results  Component Value Date   HGBA1C 5.4 (A) 11/29/2018    Assessment & Plan:   1. Diabetes mellitus screening Normal with A1c of 5.4 - POCT glycosylated hemoglobin (Hb A1C)  2. Essential hypertension Controlled Continue current regimen Counseled on blood pressure goal of less than 130/80, low-sodium, DASH diet, medication compliance, 150 minutes of moderate intensity exercise per week. Discussed medication compliance, adverse effects. - amLODipine (NORVASC) 5 MG tablet; Take 1 tablet (5 mg total) by mouth daily.  Dispense: 90 tablet; Refill: 1 - CMP14+EGFR  3. 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  Dispense: 30 tablet; Refill: 3 - methocarbamol (ROBAXIN-750) 750 MG tablet; Take 1 tablet (750 mg total) by mouth 2 (two) times daily as needed for muscle spasms.  Dispense: 180 tablet; Refill: 1 - traMADol (ULTRAM) 50 MG tablet; Take 1 tablet (50 mg total) by mouth every 12 (twelve) hours as needed for moderate pain.  Dispense: 40 tablet; Refill: 1  4. Skin lesion Tiny lump in the gluteal muscle Patient reassured Advised to observe for increased in size  5. Chronic pain of both knees Likely underlying osteoarthritis - meloxicam (MOBIC) 7.5 MG tablet; Take 1 tablet (7.5 mg total) by mouth daily.  Dispense: 30 tablet; Refill: 3 - DG Knee Complete 4 Views Left; Future - DG Knee Complete 4 Views Right; Future   Meds ordered this encounter  Medications  . amLODipine (NORVASC) 5 MG tablet    Sig: Take 1 tablet (5 mg total) by mouth daily.    Dispense:  90 tablet    Refill:  1  .  meloxicam (MOBIC) 7.5 MG tablet    Sig: Take 1 tablet (7.5 mg total) by mouth daily.     Dispense:  30 tablet    Refill:  3  . methocarbamol (ROBAXIN-750) 750 MG tablet    Sig: Take 1 tablet (750 mg total) by mouth 2 (two) times daily as needed for muscle spasms.    Dispense:  180 tablet    Refill:  1  . traMADol (ULTRAM) 50 MG tablet    Sig: Take 1 tablet (50 mg total) by mouth every 12 (twelve) hours as needed for moderate pain.    Dispense:  40 tablet    Refill:  1    Follow-up: Return in about 6 months (around 05/30/2019) for Chronic medical conditions.       Charlott Rakes, MD, FAAFP. [facility] [place], [place]   11/29/2018, 2:45 PM

## 2018-11-30 ENCOUNTER — Other Ambulatory Visit: Payer: Self-pay | Admitting: Thoracic Surgery (Cardiothoracic Vascular Surgery)

## 2018-11-30 ENCOUNTER — Other Ambulatory Visit: Payer: Self-pay | Admitting: Family Medicine

## 2018-11-30 DIAGNOSIS — R911 Solitary pulmonary nodule: Secondary | ICD-10-CM

## 2018-11-30 DIAGNOSIS — R748 Abnormal levels of other serum enzymes: Secondary | ICD-10-CM

## 2018-11-30 LAB — CMP14+EGFR
ALT: 22 IU/L (ref 0–44)
AST: 22 IU/L (ref 0–40)
Albumin/Globulin Ratio: 2 (ref 1.2–2.2)
Albumin: 4.6 g/dL (ref 3.8–4.8)
Alkaline Phosphatase: 173 IU/L — ABNORMAL HIGH (ref 39–117)
BUN/Creatinine Ratio: 18 (ref 10–24)
BUN: 18 mg/dL (ref 8–27)
Bilirubin Total: 1.1 mg/dL (ref 0.0–1.2)
CO2: 22 mmol/L (ref 20–29)
Calcium: 9.1 mg/dL (ref 8.6–10.2)
Chloride: 102 mmol/L (ref 96–106)
Creatinine, Ser: 0.99 mg/dL (ref 0.76–1.27)
GFR calc Af Amer: 93 mL/min/{1.73_m2} (ref >59)
GFR calc non Af Amer: 81 mL/min/{1.73_m2} (ref >59)
Globulin, Total: 2.3 g/dL (ref 1.5–4.5)
Glucose: 83 mg/dL (ref 65–99)
Potassium: 3.8 mmol/L (ref 3.5–5.2)
Sodium: 138 mmol/L (ref 134–144)
Total Protein: 6.9 g/dL (ref 6.0–8.5)

## 2018-12-01 ENCOUNTER — Telehealth: Payer: Self-pay

## 2018-12-01 ENCOUNTER — Other Ambulatory Visit: Payer: Self-pay

## 2018-12-01 ENCOUNTER — Ambulatory Visit
Admission: RE | Admit: 2018-12-01 | Discharge: 2018-12-01 | Disposition: A | Discharge status: Home / Self Care | Payer: No Typology Code available for payment source | Source: Ambulatory Visit | Attending: Family Medicine | Admitting: Family Medicine

## 2018-12-01 DIAGNOSIS — G8929 Other chronic pain: Secondary | ICD-10-CM

## 2018-12-01 DIAGNOSIS — M25562 Pain in left knee: Secondary | ICD-10-CM

## 2018-12-01 DIAGNOSIS — R748 Abnormal levels of other serum enzymes: Secondary | ICD-10-CM

## 2018-12-01 NOTE — Telephone Encounter (Signed)
Patient has been informed of lab results in clinic and [place] appointment.

## 2018-12-01 NOTE — Telephone Encounter (Signed)
-----   Message from Charlott Rakes, MD sent at 11/30/2018  6:17 PM EDT ----- One of his  enzymes is elevated and I have ordered a right upper quadrant ultrasound (which I will need you to schedule) and additional blood work (hepatitis panel, GGT)  for add on labs.  Thank you.

## 2018-12-02 ENCOUNTER — Telehealth: Payer: Self-pay

## 2018-12-02 NOTE — Telephone Encounter (Signed)
Patient was called and informed of X-ray results.

## 2018-12-02 NOTE — Telephone Encounter (Signed)
-----   Message from Charlott Rakes, MD sent at 12/02/2018  1:07 PM EDT ----- Left knee x-ray reveals osteoarthritis, right knee is unremarkable.  Continue current medication regimen

## 2018-12-03 LAB — HEPATITIS PANEL, ACUTE
Hep A IgM: NEGATIVE
Hep B C IgM: NEGATIVE
Hep C Virus Ab: <0.1 s/co ratio (ref 0.0–0.9)
Hepatitis B Surface Ag: NEGATIVE

## 2018-12-03 LAB — SPECIMEN STATUS REPORT

## 2018-12-03 LAB — PSA, TOTAL AND FREE
PSA, Free Pct: 27.5 %
PSA, Free: 0.33 ng/mL
Prostate Specific Ag, Serum: 1.2 ng/mL (ref 0.0–4.0)

## 2018-12-03 LAB — GAMMA GT: GGT: 34 [IU]/L (ref 0–65)

## 2018-12-07 ENCOUNTER — Other Ambulatory Visit: Payer: Self-pay

## 2018-12-07 ENCOUNTER — Ambulatory Visit (HOSPITAL_COMMUNITY)
Admission: RE | Admit: 2018-12-07 | Discharge: 2018-12-07 | Disposition: A | Discharge status: Home / Self Care | Payer: No Typology Code available for payment source | Source: Ambulatory Visit | Attending: Family Medicine | Admitting: Family Medicine

## 2018-12-07 DIAGNOSIS — R748 Abnormal levels of other serum enzymes: Secondary | ICD-10-CM | POA: Insufficient documentation

## 2018-12-08 ENCOUNTER — Telehealth: Payer: Self-pay

## 2018-12-08 NOTE — Telephone Encounter (Signed)
Patient was called and informed of lab results and ultrasound results.

## 2018-12-08 NOTE — Telephone Encounter (Signed)
-----   Message from Charlott Rakes, MD sent at 12/05/2018  6:34 AM EDT ----- His additional lab tests are normal indicating elevated alkaline phosphatase could be from bone pains. We will also await RUQ ultrasound report.

## 2018-12-08 NOTE — Telephone Encounter (Signed)
-----   Message from Charlott Rakes, MD sent at 12/07/2018  1:06 PM EDT ----- Abdominal ultrasound is normal

## 2018-12-21 ENCOUNTER — Ambulatory Visit
Admission: RE | Admit: 2018-12-21 | Discharge: 2018-12-21 | Disposition: A | Discharge status: Home / Self Care | Payer: No Typology Code available for payment source | Source: Ambulatory Visit | Attending: Thoracic Surgery (Cardiothoracic Vascular Surgery) | Admitting: Thoracic Surgery (Cardiothoracic Vascular Surgery)

## 2018-12-21 DIAGNOSIS — R911 Solitary pulmonary nodule: Secondary | ICD-10-CM

## 2018-12-27 ENCOUNTER — Encounter: Payer: Self-pay | Admitting: Thoracic Surgery (Cardiothoracic Vascular Surgery)

## 2018-12-27 ENCOUNTER — Encounter: Payer: Self-pay | Admitting: *Deleted

## 2018-12-27 ENCOUNTER — Other Ambulatory Visit: Payer: Self-pay

## 2018-12-27 ENCOUNTER — Ambulatory Visit (INDEPENDENT_AMBULATORY_CARE_PROVIDER_SITE_OTHER): Payer: No Typology Code available for payment source | Admitting: Thoracic Surgery (Cardiothoracic Vascular Surgery)

## 2018-12-27 VITALS — BP 137/87 | HR 75 | Temp 97.6°F | Resp 16 | Ht 66.0 in | Wt 190.0 lb

## 2018-12-27 DIAGNOSIS — D381 Neoplasm of uncertain behavior of trachea, bronchus and lung: Secondary | ICD-10-CM

## 2018-12-27 NOTE — Progress Notes (Signed)
[address]       [place] [address]             [phone]     HPI: [name] returns for a scheduled follow-up visit  Aaron Dennis is a 63 year old man with a past history of tobacco abuse, vitamin D deficiency, hypertension, reflux, and the left upper lobe nodule.  The left upper lobe nodule was first noted on a CT of the chest in 2015.  In 2017 a repeat CT showed the nodule had increased from 6 to 7 mm.  He refused surgery and we have continue with radiographic follow-up.  Follow-up CT in May 2018 and 2019 showed the nodule stable at 5 mm.  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  He is concerned as to whether this lung nodule would make him more at risk for Covid.  Past Medical History:  Diagnosis Date  . GERD (gastroesophageal reflux disease)   . Hypertension   . Lung nodule    7 mm LUL nodule    Current Outpatient Medications  Medication Sig Dispense Refill  . amLODipine (NORVASC) 5 MG tablet Take 1 tablet (5 mg total) by mouth daily. 90 tablet 1  . diclofenac sodium (VOLTAREN) 1 % GEL Apply 2 g topically 4 (four) times daily. Rub into affected area of foot 2 to 4 times daily (Patient taking differently: Apply 2 g topically 4 (four) times daily as needed. Rub into affected area of foot 2 to 4 times daily) 100 g 2  . hydrocortisone-pramoxine (ANALPRAM HC) 2.5-1 % rectal cream Place 1 application rectally 3 (three) times daily. (Patient taking differently: Place 1 application rectally 3 (three) times daily as needed. ) 30 g 3  . meloxicam (MOBIC) 7.5 MG tablet Take 1 tablet (7.5 mg total) by mouth daily. 30 tablet 3  . methocarbamol (ROBAXIN-750) 750 MG tablet Take 1 tablet (750 mg total) by mouth 2 (two) times daily as needed for muscle spasms. 180 tablet 1  . omeprazole (PRILOSEC) 40 MG capsule Take 1 capsule (40 mg total) by mouth daily. 90 capsule 1  . traMADol (ULTRAM) 50 MG tablet Take 1 tablet (50 mg total) by  mouth every 12 (twelve) hours as needed for moderate pain. 40 tablet 1   Current Facility-Administered Medications  Medication Dose Route Frequency Provider Last Rate Last Dose  . 0.9 %  sodium chloride infusion  500 mL Intravenous Once Jackquline Denmark, MD        Physical Exam BP 137/87 (BP Location: Left Arm, Patient Position: Sitting, Cuff Size: Normal)   Pulse 75   Temp 97.6 F (36.4 C)   Resp 16   Ht 5\' 6"  (1.676 m)   Wt 190 lb (86.2 kg)   SpO2 95% Comment: RA  BMI 30.61 kg/m  63 year old man in no acute distress Alert and oriented x3 with no focal deficits Lungs clear with equal breath sounds bilaterally Cardiac regular rate and rhythm normal S1-S2  Diagnostic Tests: CT CHEST WITHOUT CONTRAST  TECHNIQUE: Multidetector CT imaging of the chest was performed following the standard protocol without IV contrast.  COMPARISON:  04/19/2017  FINDINGS: Cardiovascular: Normal heart size. No pericardial effusion identified. Lad and RCA coronary artery calcifications.  Mediastinum/Nodes: Normal appearance of the thyroid gland. The trachea appears patent and is midline. Normal appearance of the esophagus. No mediastinal or hilar adenopathy identified. Calcified mediastinal and hilar lymph nodes are  again noted.  Lungs/Pleura: No pleural effusion identified. No airspace consolidation, atelectasis or pneumothorax. Stable 5 mm posterior left upper lobe lung nodule. No new or enlarging pulmonary nodules.  Upper Abdomen: No acute abnormality.  Musculoskeletal: Diffusely abnormal bone mineralization is noted throughout the bony thorax. The appearance is similar to 06/23/2016. as noted previously there is a diffuse marked the need and sclerotic appearance of the bony thorax.  IMPRESSION: 1. No acute cardiopulmonary abnormalities. 2. Stable 5 mm left upper lobe lung nodule. 3. Unchanged, diffuse multi in sclerotic appearance of the visualized osseous structures. As  mentioned previously findings may reflect underlying metabolic bone disorder, myelofibrosis or other marrow infiltrative process. 4. Aortic atherosclerosis. Coronary artery calcifications noted.  Aortic Atherosclerosis (ICD10-I70.0).   Electronically Signed   By: Kerby Moors M.D.   On: 12/21/2018 11:44 I personally reviewed the CT images and concur with the findings noted above.  Impression: Aaron Dennis is a 63 year old gentleman from the [place] who has a past medical history significant for tobacco abuse, vitamin D deficiency, hypertension, and gastroesophageal reflux.  He was first found to have a lung nodule back in 2015.  On a follow-up scan about 2 years later the nodule was measured slightly larger.  This difference was likely within the range of error.  He has been followed radiographically.  On his scan today the nodule remains 5 mm.  That goes back to 5 years.  This is consistent with a benign nodule.  That specific nodule does not need any additional follow-up.  Given his long-term history of tobacco use (hookah pipe) he should be followed with annual low-dose screening CTs.  He had questions whether this lung nodule and Covid.  I reassured him that it had no bearing on his risk for contracting that.  Plan: Annual low-dose screening CT for lung cancer due to smoking history  Melrose Nakayama, MD [facility] [phone]

## 2019-01-03 ENCOUNTER — Telehealth: Payer: Self-pay | Admitting: *Deleted

## 2019-01-03 DIAGNOSIS — G8929 Other chronic pain: Secondary | ICD-10-CM

## 2019-01-03 DIAGNOSIS — M25562 Pain in left knee: Secondary | ICD-10-CM

## 2019-01-03 NOTE — Telephone Encounter (Signed)
Patient complains of increased left knee pain and would like to see the specialist.

## 2019-01-04 NOTE — Telephone Encounter (Signed)
I have placed the referral

## 2019-01-04 NOTE — Addendum Note (Signed)
Addended by: Charlott Rakes on: 01/04/2019 01:39 PM   Modules accepted: Orders

## 2019-01-09 ENCOUNTER — Ambulatory Visit: Payer: No Typology Code available for payment source | Admitting: Physical Therapy

## 2019-01-13 ENCOUNTER — Ambulatory Visit (INDEPENDENT_AMBULATORY_CARE_PROVIDER_SITE_OTHER): Payer: No Typology Code available for payment source | Admitting: Orthopaedic Surgery

## 2019-01-13 ENCOUNTER — Other Ambulatory Visit: Payer: Self-pay

## 2019-01-13 ENCOUNTER — Encounter: Payer: Self-pay | Admitting: Orthopaedic Surgery

## 2019-01-13 VITALS — Ht 66.0 in | Wt 190.0 lb

## 2019-01-13 DIAGNOSIS — M1711 Unilateral primary osteoarthritis, right knee: Secondary | ICD-10-CM | POA: Insufficient documentation

## 2019-01-13 DIAGNOSIS — M1712 Unilateral primary osteoarthritis, left knee: Secondary | ICD-10-CM

## 2019-01-13 NOTE — Progress Notes (Signed)
Office Visit Note   Patient: Aaron Dennis           Date of Birth: [birth year]           MRN: [id number] Visit Date: 01/13/2019              Requested by: Charlott Rakes, MD [place],  [place] [address] PCP: Charlott Rakes, MD   Assessment & Plan: Visit Diagnoses:  1. Primary osteoarthritis of left knee   2. Primary osteoarthritis of right knee     Plan: Impression is bilateral knee patellofemoral chondromalacia and OA.  Reassurance was provided that the crepitus is not pathologic per se.  We did briefly discuss cortisone injections but patient would like to pass on this for now.  He is agreeable to physical therapy.  I have recommended Voltaren gel which she will pick up over-the-counter.  Over-the-counter knee braces as well.  Follow-up as needed.  Follow-Up Instructions: Return if symptoms worsen or fail to improve.   Orders:  No orders of the defined types were placed in this encounter.  No orders of the defined types were placed in this encounter.     Procedures: No procedures performed   Clinical Data: No additional findings.   Subjective: Chief Complaint  Patient presents with  . Right Knee - Pain    [name] is a pleasant 64 year old gentleman here for evaluation of bilateral knee pain mainly in the left.  He has not had any injuries.  He states he has difficulty with stiffness in the morning and with prolonged immobilization as well as chronic aching pain.  He has no real significant pain with walking.  He takes meloxicam.  He already has a physical therapy appointment set up in the near future.  His pain is more along the lateral aspect of the patellofemoral articulation.   Review of Systems  Constitutional: Negative.   All other systems reviewed and are negative.    Objective: Vital Signs: Ht 5\' 6"  (1.676 m)   Wt 190 lb (86.2 kg)   BMI 30.67 kg/m   Physical Exam Vitals signs and nursing note reviewed.  Constitutional:    Appearance: He is well-developed.  HENT:     Head: Normocephalic and atraumatic.  Eyes:     Pupils: Pupils are equal, round, and reactive to light.  Neck:     Musculoskeletal: Neck supple.  Pulmonary:     Effort: Pulmonary effort is normal.  Abdominal:     Palpations: Abdomen is soft.  Musculoskeletal: Normal range of motion.  Skin:    General: Skin is warm.  Neurological:     Mental Status: He is alert and oriented to person, place, and time.  Psychiatric:        Behavior: Behavior normal.        Thought Content: Thought content normal.        Judgment: Judgment normal.     Ortho Exam Bilateral knee exam shows no effusion.  He does have painless patellofemoral crepitus.  Normal range of motion.  Collaterals and cruciates are stable.  No joint line tenderness. Specialty Comments:  No specialty comments available.  Imaging: No results found.   PMFS History: Patient Active Problem List   Diagnosis Date Noted  . Primary osteoarthritis of left knee 01/13/2019  . Primary osteoarthritis of right knee 01/13/2019  . Flat foot 11/28/2017  . Tendonitis of foot 11/28/2017  . Polycythemia 05/12/2017  . Vitamin D deficiency 04/21/2017  . Bony sclerosis  08/05/2016  . Lung nodule   . Chronic pain of both knees 12/05/2015  . Gastroesophageal reflux disease without esophagitis 05/17/2014  . Essential hypertension 05/17/2014  . Coronary artery calcification 08/17/2012  . Chronic abdominal pain 06/24/2012  . Right-sided chest wall pain 06/24/2012  . Constipation, chronic 06/24/2012  . Hyperbilirubinemia 06/24/2012  . Chronic back pain 06/24/2012  . DDD (degenerative disc disease), thoracolumbar 06/24/2012  . History of nephrolithiasis 06/24/2012  . Side pain 06/24/2012  . Low back pain at multiple sites 06/24/2012   Past Medical History:  Diagnosis Date  . GERD (gastroesophageal reflux disease)   . Hypertension   . Lung nodule    7 mm LUL nodule    Family History   Problem Relation Age of Onset  . Healthy Mother   . Diabetes Brother   . Colon polyps Neg Hx   . Colon cancer Neg Hx   . Esophageal cancer Neg Hx   . Rectal cancer Neg Hx   . Stomach cancer Neg Hx     History reviewed. No pertinent surgical history. Social History   Occupational History  . Not on file  Tobacco Use  . Smoking status: Former Research scientist (life sciences)  . Smokeless tobacco: Never Used  . Tobacco comment: quit Jan 2018  Substance and Sexual Activity  . Alcohol use: No  . Drug use: No  . Sexual activity: Not on file

## 2019-01-24 ENCOUNTER — Ambulatory Visit: Payer: Self-pay | Attending: Family Medicine | Admitting: Family Medicine

## 2019-01-24 ENCOUNTER — Encounter: Payer: Self-pay | Admitting: Family Medicine

## 2019-01-24 ENCOUNTER — Other Ambulatory Visit: Payer: Self-pay

## 2019-01-24 VITALS — BP 145/96 | HR 72 | Temp 98.0°F | Ht 66.0 in | Wt 188.4 lb

## 2019-01-24 DIAGNOSIS — M546 Pain in thoracic spine: Secondary | ICD-10-CM

## 2019-01-24 DIAGNOSIS — M545 Low back pain, unspecified: Secondary | ICD-10-CM

## 2019-01-24 DIAGNOSIS — K219 Gastro-esophageal reflux disease without esophagitis: Secondary | ICD-10-CM

## 2019-01-24 MED ORDER — OMEPRAZOLE 40 MG PO CPDR: 40.0000 mg | DELAYED_RELEASE_CAPSULE | Freq: Every day | ORAL | 1 refills | Status: DC

## 2019-01-24 MED FILL — AMLODIPINE BESYLATE 5 MG TA: 5 | 30 days supply | Qty: 30 | Fill #1

## 2019-01-24 MED FILL — OMEPRAZOLE DR 40 MG CAPSULE: 40 | 30 days supply | Qty: 30 | Fill #0

## 2019-01-24 MED FILL — MELOXICAM 7.5 MG TABLET: 7.5 | 30 days supply | Qty: 30 | Fill #1

## 2019-01-24 MED FILL — traMADol HCL 50 MG TABS: 50 | 20 days supply | Qty: 40 | Fill #1

## 2019-01-24 NOTE — Progress Notes (Signed)
Subjective:  Patient ID: Aaron Dennis, male    DOB: Jan 01, 1956  Age: 63 y.o. MRN: [id number]  CC: Hypertension   HPI Aaron Dennis is a 63 year old male with a history of hypertension, polycythemia (s/p phlebotomy in the past), chronic thoracolumbar pain, GERD here for an acute visit. He would like some more explanation with regards to his abdominal ultrasound which was performed due to elevated alkaline phosphatase.  I have explained to him ultrasound findings were normal and alkaline phosphatase elevation could be of bony origin.  He complains of thoracolumbar back pain moderate to severe which is chronic.  He was seen for multilevel bulging disc by neurosurgery and no surgical intervention planned.  I had referred him for physical therapy but he would like to wait with regards to his PT due to the Pandemic. 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  Past Medical History:  Diagnosis Date  . GERD (gastroesophageal reflux disease)   . Hypertension   . Lung nodule    7 mm LUL nodule    No past surgical history on file.  Family History  Problem Relation Age of Onset  . Healthy Mother   . Diabetes Brother   . Colon polyps Neg Hx   . Colon cancer Neg Hx   . Esophageal cancer Neg Hx   . Rectal cancer Neg Hx   . Stomach cancer Neg Hx     No Known Allergies  Outpatient Medications Prior to Visit  Medication Sig Dispense Refill  . amLODipine (NORVASC) 5 MG tablet Take 1 tablet (5 mg total) by mouth daily. 90 tablet 1  . diclofenac sodium (VOLTAREN) 1 % GEL Apply 2 g topically 4 (four) times daily. Rub into affected area of foot 2 to 4 times daily (Patient taking differently: Apply 2 g topically 4 (four) times daily as needed. Rub into affected area of foot 2 to 4 times daily) 100 g 2  . hydrocortisone-pramoxine (ANALPRAM HC) 2.5-1 % rectal cream Place 1 application rectally 3 (three) times daily. (Patient taking differently: Place 1 application  rectally 3 (three) times daily as needed. ) 30 g 3  . meloxicam (MOBIC) 7.5 MG tablet Take 1 tablet (7.5 mg total) by mouth daily. 30 tablet 3  . methocarbamol (ROBAXIN-750) 750 MG tablet Take 1 tablet (750 mg total) by mouth 2 (two) times daily as needed for muscle spasms. 180 tablet 1  . omeprazole (PRILOSEC) 40 MG capsule Take 1 capsule (40 mg total) by mouth daily. 90 capsule 1  . traMADol (ULTRAM) 50 MG tablet Take 1 tablet (50 mg total) by mouth every 12 (twelve) hours as needed for moderate pain. 40 tablet 1   Facility-Administered Medications Prior to Visit  Medication Dose Route Frequency Provider Last Rate Last Admin  . 0.9 %  sodium chloride infusion  500 mL Intravenous Once Jackquline Denmark, MD         ROS Review of Systems  Constitutional: Negative for activity change and appetite change.  HENT: Negative for sinus pressure and sore throat.   Eyes: Negative for visual disturbance.  Respiratory: Negative for cough, chest tightness and shortness of breath.   Cardiovascular: Negative for chest pain and leg swelling.  Gastrointestinal: Negative for abdominal distention, abdominal pain, constipation and diarrhea.  Endocrine: Negative.   Genitourinary: Negative for dysuria.  Musculoskeletal: Positive for back pain. Negative for joint swelling and myalgias.  Skin: Negative for rash.  Allergic/Immunologic: Negative.  Neurological: Negative for weakness, light-headedness and numbness.  Psychiatric/Behavioral: Negative for dysphoric mood and suicidal ideas.    Objective:  BP (!) 145/96   Pulse 72   Temp 98 F (36.7 C) (Oral)   Ht 5\' 6"  (1.676 m)   Wt 188 lb 6.4 oz (85.5 kg)   SpO2 99%   BMI 30.41 kg/m   BP/Weight 01/24/2019 01/13/2019 [id number]  Systolic BP [id number] - 0000000  Diastolic BP 96 - 87  Wt. (Lbs) 188.4 190 190  BMI 30.41 30.67 30.67      Physical Exam Constitutional:      Appearance: He is well-developed.  Neck:     Vascular: No JVD.  Cardiovascular:     Rate  and Rhythm: Normal rate.     Heart sounds: Normal heart sounds. No murmur.  Pulmonary:     Effort: Pulmonary effort is normal.     Breath sounds: Normal breath sounds. No wheezing or rales.  Chest:     Chest wall: No tenderness.  Abdominal:     General: Bowel sounds are normal. There is no distension.     Palpations: Abdomen is soft. There is no mass.     Tenderness: There is no abdominal tenderness.  Musculoskeletal:        General: Normal range of motion.     Right lower leg: No edema.     Left lower leg: No edema.     Comments: Slight right thoracolumbar pain which is worse on twisting motion  Neurological:     Mental Status: He is alert and oriented to person, place, and time.  Psychiatric:        Mood and Affect: Mood normal.     CMP Latest Ref Rng & Units 11/29/2018 07/12/2017 04/28/2017  Glucose 65 - 99 mg/dL 83 106(H) 127  BUN 8 - 27 mg/dL 18 11 12   Creatinine 0.76 - 1.27 mg/dL 0.99 0.97 0.92  Sodium 134 - 144 mmol/L 138 140 141  Potassium 3.5 - 5.2 mmol/L 3.8 3.8 3.4(L)  Chloride 96 - 106 mmol/L 102 105 107  CO2 20 - 29 mmol/L 22 24 25   Calcium 8.6 - 10.2 mg/dL 9.1 8.8(L) 9.5  Total Protein 6.0 - 8.5 g/dL 6.9 7.3 7.5  Total Bilirubin 0.0 - 1.2 mg/dL 1.1 2.3(H) 1.4(H)  Alkaline Phos 39 - 117 IU/L 173(H) 137(H) 155(H)  AST 0 - 40 IU/L 22 17 20   ALT 0 - 44 IU/L 22 16(L) 22    Lipid Panel     Component Value Date/Time   CHOL 171 04/08/2017 0942   TRIG 91 04/08/2017 0942   HDL 32 (L) 04/08/2017 0942   CHOLHDL 5.3 (H) 04/08/2017 0942   CHOLHDL 6.7 (H) 12/05/2015 1109   VLDL 23 12/05/2015 1109   LDLCALC 121 (H) 04/08/2017 0942    CBC    Component Value Date/Time   WBC 7.3 09/13/2017 1425   WBC 7.6 07/12/2017 1443   RBC 6.12 (H) 09/13/2017 1425   HGB 17.0 09/13/2017 1425   HGB 18.0 (H) 04/08/2017 0942   HGB 16.5 03/13/2009 0931   HCT 51.0 (H) 09/13/2017 1425   HCT 51.9 (H) 04/08/2017 0942   HCT 49.8 03/13/2009 0931   PLT 193 09/13/2017 1425   PLT 234  04/08/2017 0942   MCV 83.3 09/13/2017 1425   MCV 82 04/08/2017 0942   MCV 82.2 03/13/2009 0931   MCH 27.8 09/13/2017 1425   MCHC 33.3 09/13/2017 1425   RDW 14.2 09/13/2017 1425   RDW  15.1 04/08/2017 0942   RDW 15.5 (H) 03/13/2009 0931   LYMPHSABS 1.9 09/13/2017 1425   LYMPHSABS 2.4 04/08/2017 0942   LYMPHSABS 2.2 03/13/2009 0931   MONOABS 0.6 09/13/2017 1425   MONOABS 0.5 03/13/2009 0931   EOSABS 0.1 09/13/2017 1425   EOSABS 0.1 04/08/2017 0942   BASOSABS 0.1 09/13/2017 1425   BASOSABS 0.0 04/08/2017 0942   BASOSABS 0.0 03/13/2009 0931    Lab Results  Component Value Date   HGBA1C 5.4 (A) 11/29/2018    Assessment & Plan:   1. Gastroesophageal reflux disease without esophagitis Controlled - omeprazole (PRILOSEC) 40 MG capsule; Take 1 capsule (40 mg total) by mouth daily.  Dispense: 90 capsule; Refill: 1  2. 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      Charlott Rakes, MD, FAAFP. [facility] [place] [place]   01/24/2019, 9:37 AM

## 2019-01-24 NOTE — Progress Notes (Signed)
Patient has questions about recent ultrasound.  Patient is having pain on right side of body.

## 2019-01-24 NOTE — Patient Instructions (Signed)

## 2019-03-27 MED FILL — OMEPRAZOLE DR 40 MG CAPSULE: 40 | 30 days supply | Qty: 30 | Fill #1

## 2019-03-28 ENCOUNTER — Ambulatory Visit: Payer: No Typology Code available for payment source

## 2019-03-28 ENCOUNTER — Other Ambulatory Visit: Payer: Self-pay | Admitting: Sports Medicine

## 2019-03-28 ENCOUNTER — Other Ambulatory Visit: Payer: Self-pay

## 2019-03-28 ENCOUNTER — Encounter: Payer: Self-pay | Admitting: Sports Medicine

## 2019-03-28 ENCOUNTER — Ambulatory Visit (INDEPENDENT_AMBULATORY_CARE_PROVIDER_SITE_OTHER): Payer: No Typology Code available for payment source | Admitting: Sports Medicine

## 2019-03-28 VITALS — Temp 98.2°F

## 2019-03-28 DIAGNOSIS — M722 Plantar fascial fibromatosis: Secondary | ICD-10-CM

## 2019-03-28 DIAGNOSIS — M779 Enthesopathy, unspecified: Secondary | ICD-10-CM

## 2019-03-28 DIAGNOSIS — M25571 Pain in right ankle and joints of right foot: Secondary | ICD-10-CM

## 2019-03-28 DIAGNOSIS — M2142 Flat foot [pes planus] (acquired), left foot: Secondary | ICD-10-CM

## 2019-03-28 DIAGNOSIS — M2141 Flat foot [pes planus] (acquired), right foot: Secondary | ICD-10-CM

## 2019-03-28 DIAGNOSIS — M79671 Pain in right foot: Secondary | ICD-10-CM

## 2019-03-28 DIAGNOSIS — M7750 Other enthesopathy of unspecified foot: Secondary | ICD-10-CM

## 2019-03-28 MED FILL — MELOXICAM 7.5 MG TABLET: 7.5 | 30 days supply | Qty: 30 | Fill #2

## 2019-03-28 MED FILL — AMLODIPINE BESYLATE 5 MG TA: 5 | 30 days supply | Qty: 30 | Fill #2

## 2019-03-28 NOTE — Progress Notes (Signed)
Subjective: Aaron Dennis is a 64 y.o. male patient who presents to office for evaluation of right foot and ankle pain. Patient complains of progressive pain especially over the last several years in the right foot at the side of the ankle and the lateal side of the bottom of the heel that hurts in the morning and sometimes at night. Ranks pain 5/10 and is now interferring with daily activities. Patient has tried rest and elevation with no relief in symptoms. Patient denies any other pedal complaints. Denies injury/trip/fall/sprain/any causative factors.   Patient Active Problem List   Diagnosis Date Noted  . Primary osteoarthritis of left knee 01/13/2019  . Primary osteoarthritis of right knee 01/13/2019  . Flat foot 11/28/2017  . Tendonitis of foot 11/28/2017  . Polycythemia 05/12/2017  . Vitamin D deficiency 04/21/2017  . Bony sclerosis 08/05/2016  . Lung nodule   . Chronic pain of both knees 12/05/2015  . Gastroesophageal reflux disease without esophagitis 05/17/2014  . Essential hypertension 05/17/2014  . Coronary artery calcification 08/17/2012  . Chronic abdominal pain 06/24/2012  . Right-sided chest wall pain 06/24/2012  . Constipation, chronic 06/24/2012  . Hyperbilirubinemia 06/24/2012  . Chronic back pain 06/24/2012  . DDD (degenerative disc disease), thoracolumbar 06/24/2012  . History of nephrolithiasis 06/24/2012  . Side pain 06/24/2012  . Low back pain at multiple sites 06/24/2012    Current Outpatient Medications on File Prior to Visit  Medication Sig Dispense Refill  . amLODipine (NORVASC) 5 MG tablet Take 1 tablet (5 mg total) by mouth daily. 90 tablet 1  . meloxicam (MOBIC) 7.5 MG tablet Take 1 tablet (7.5 mg total) by mouth daily. 30 tablet 3  . methocarbamol (ROBAXIN-750) 750 MG tablet Take 1 tablet (750 mg total) by mouth 2 (two) times daily as needed for muscle spasms. 180 tablet 1  . omeprazole (PRILOSEC) 40 MG capsule Take 1 capsule (40 mg total) by mouth  daily. 90 capsule 1   Current Facility-Administered Medications on File Prior to Visit  Medication Dose Route Frequency Provider Last Rate Last Admin  . 0.9 %  sodium chloride infusion  500 mL Intravenous Once Jackquline Denmark, MD        No Known Allergies  Objective:  General: Alert and oriented x3 in no acute distress  Dermatology: No open lesions bilateral lower extremities, no webspace macerations, no ecchymosis bilateral, all nails x 10 are well manicured.  Vascular: Dorsalis Pedis and Posterior Tibial pedal pulses palpable, Capillary Fill Time 3 seconds,(+) pedal hair growth bilateral, no edema bilateral lower extremities, Temperature gradient within normal limits.  Neurology: Johney Maine sensation intact via light touch bilateral.  Musculoskeletal: Mild tenderness with palpation at right medial and lateral ankle gutters and plantar and medial heel at the plantar fascia insertion at peroneal tendon course on the right, No pain with calf compression bilateral. + pes planus foot type bilateral.   Xrays  Right Ankle/Foot   Impression: Normal mineralizatoin, joint space narrowing at medial and lateral ankle with areas of arthritis at ankle, midfoot, and 1st MTPJ, midtarsal breach supportive of pes planus. Soft tissues within normal limits. No other acute findings.   Assessment and Plan: Problem List Items Addressed This Visit      Musculoskeletal and Integument   Tendonitis of foot     Other   Flat foot    Other Visit Diagnoses    Acute right ankle pain    -  Primary   Relevant Orders   DG Ankle 2 Views  Right   Right foot pain       Relevant Orders   DG Foot 2 Views Right   Plantar fasciitis of right foot           -Complete examination performed -Xrays reviewed -Discussed treatment options -Recommend daily stretching  -Recommend OTC tylenol arthritis PRN -Dispensed heel lifts to use bilateral for foot pain  -Patient to return to office if no better in 4 weeks for follow up  evaluation or sooner if condition worsens. If pain is bothersome may benefit from injection at plantar heel.  Landis Martins, DPM

## 2019-03-28 NOTE — Patient Instructions (Addendum)
Tylenol arthritis for foot pain and arthritis get OTC at [facility] or [facility]

## 2019-04-24 ENCOUNTER — Other Ambulatory Visit: Payer: Self-pay

## 2019-04-24 ENCOUNTER — Ambulatory Visit: Payer: Self-pay | Attending: Family Medicine

## 2019-04-25 ENCOUNTER — Ambulatory Visit: Payer: No Typology Code available for payment source | Admitting: Sports Medicine

## 2019-05-09 ENCOUNTER — Ambulatory Visit: Payer: No Typology Code available for payment source | Admitting: Sports Medicine

## 2019-05-09 ENCOUNTER — Other Ambulatory Visit: Payer: Self-pay

## 2019-05-09 ENCOUNTER — Encounter: Payer: Self-pay | Admitting: Sports Medicine

## 2019-05-09 VITALS — Temp 98.7°F

## 2019-05-09 DIAGNOSIS — M7750 Other enthesopathy of unspecified foot: Secondary | ICD-10-CM

## 2019-05-09 DIAGNOSIS — M2142 Flat foot [pes planus] (acquired), left foot: Secondary | ICD-10-CM

## 2019-05-09 DIAGNOSIS — M2141 Flat foot [pes planus] (acquired), right foot: Secondary | ICD-10-CM

## 2019-05-09 DIAGNOSIS — M25571 Pain in right ankle and joints of right foot: Secondary | ICD-10-CM

## 2019-05-09 DIAGNOSIS — M722 Plantar fascial fibromatosis: Secondary | ICD-10-CM

## 2019-05-09 DIAGNOSIS — M79671 Pain in right foot: Secondary | ICD-10-CM

## 2019-05-09 MED ORDER — PREDNISONE 10 MG (21) PO TBPK: ORAL_TABLET | ORAL | 0 refills | Status: DC

## 2019-05-09 MED ORDER — TRIAMCINOLONE ACETONIDE 10 MG/ML IJ SUSP
10.0000 mg | Freq: Once | INTRAMUSCULAR | Status: AC
  Administered 2019-05-09: 10 mg

## 2019-05-09 MED FILL — predniSONE 10 MG TABS: 10 | 6 days supply | Qty: 21 | Fill #0

## 2019-05-09 NOTE — Progress Notes (Signed)
Subjective: Aaron Dennis is a 64 y.o. male patient who presents to office for follow-up evaluation of right foot and ankle pain. Patient reports that he still has pain nothing has helped with the pain today pain is 8 out of 10 worse at the bottom of his heel as well as the side of the foot especially when he is doing his stretching exercises and he stretches too far.  Patient reports that he has been taking Tylenol medication but has not noticed any relief.  Patient denies any other pedal complaints at this time.  Patient Active Problem List   Diagnosis Date Noted  . Primary osteoarthritis of left knee 01/13/2019  . Primary osteoarthritis of right knee 01/13/2019  . Flat foot 11/28/2017  . Tendonitis of foot 11/28/2017  . Polycythemia 05/12/2017  . Vitamin D deficiency 04/21/2017  . Bony sclerosis 08/05/2016  . Lung nodule   . Chronic pain of both knees 12/05/2015  . Gastroesophageal reflux disease without esophagitis 05/17/2014  . Essential hypertension 05/17/2014  . Coronary artery calcification 08/17/2012  . Chronic abdominal pain 06/24/2012  . Right-sided chest wall pain 06/24/2012  . Constipation, chronic 06/24/2012  . Hyperbilirubinemia 06/24/2012  . Chronic back pain 06/24/2012  . DDD (degenerative disc disease), thoracolumbar 06/24/2012  . History of nephrolithiasis 06/24/2012  . Side pain 06/24/2012  . Low back pain at multiple sites 06/24/2012    Current Outpatient Medications on File Prior to Visit  Medication Sig Dispense Refill  . amLODipine (NORVASC) 5 MG tablet Take 1 tablet (5 mg total) by mouth daily. 90 tablet 1  . meloxicam (MOBIC) 7.5 MG tablet Take 1 tablet (7.5 mg total) by mouth daily. 30 tablet 3  . methocarbamol (ROBAXIN-750) 750 MG tablet Take 1 tablet (750 mg total) by mouth 2 (two) times daily as needed for muscle spasms. 180 tablet 1  . omeprazole (PRILOSEC) 40 MG capsule Take 1 capsule (40 mg total) by mouth daily. 90 capsule 1   Current  Facility-Administered Medications on File Prior to Visit  Medication Dose Route Frequency Provider Last Rate Last Admin  . 0.9 %  sodium chloride infusion  500 mL Intravenous Once Jackquline Denmark, MD        No Known Allergies  Objective:  General: Alert and oriented x3 in no acute distress  Dermatology: No open lesions bilateral lower extremities, no webspace macerations, no ecchymosis bilateral, all nails x 10 are well manicured.  Vascular: Dorsalis Pedis and Posterior Tibial pedal pulses palpable, Capillary Fill Time 3 seconds,(+) pedal hair growth bilateral, no edema bilateral lower extremities, Temperature gradient within normal limits.  Neurology: Johney Maine sensation intact via light touch bilateral.  Musculoskeletal: Mild tenderness with palpation at right medial and lateral ankle gutters and plantar and medial heel at the plantar fascia insertion at peroneal tendon course on the right with most pain today noted at the plantar fascial insertion medial aspect, No pain with calf compression bilateral. + pes planus foot type bilateral.   Assessment and Plan: Problem List Items Addressed This Visit      Musculoskeletal and Integument   Tendonitis of foot - Primary     Other   Flat foot    Other Visit Diagnoses    Acute right ankle pain       Plantar fasciitis of right foot       Relevant Medications   triamcinolone acetonide (KENALOG) 10 MG/ML injection 10 mg (Start on 05/09/2019  7:15 PM)   Right foot pain           -  Complete examination performed -Previous xrays reviewed -Re-Discussed treatment options -After oral consent and aseptic prep, injected a mixture containing 1 ml of 2%  plain lidocaine, 1 ml 0.5% plain marcaine, 0.5 ml of kenalog 10 and 0.5 ml of dexamethasone phosphate into right heel at plantar fascial insertion without complication. Post-injection care discussed with patient.  -Prescribed prednisone Dosepak to take as instructed -Dispensed postoperative shoe for  patient to use for the next 2 weeks to further immobilize area and to help prevent overstress and pain and advised patient that if this works well will benefit from getting the new shoes with some arch support to them -Recommend daily stretching to tolerance and avoid overstretching that could aggravate the peroneal tendon -Recommend continue with OTC tylenol arthritis for any residual pain even while taking the steroid  -Return to office in 3 to 4 weeks for follow-up evaluation. Landis Martins, DPM

## 2019-05-23 ENCOUNTER — Other Ambulatory Visit: Payer: Self-pay

## 2019-05-23 ENCOUNTER — Ambulatory Visit: Payer: Self-pay | Attending: Family Medicine

## 2019-05-23 DIAGNOSIS — M545 Low back pain: Secondary | ICD-10-CM | POA: Insufficient documentation

## 2019-05-23 DIAGNOSIS — M5441 Lumbago with sciatica, right side: Secondary | ICD-10-CM | POA: Insufficient documentation

## 2019-05-23 DIAGNOSIS — R293 Abnormal posture: Secondary | ICD-10-CM | POA: Insufficient documentation

## 2019-05-23 DIAGNOSIS — G8929 Other chronic pain: Secondary | ICD-10-CM | POA: Insufficient documentation

## 2019-05-23 DIAGNOSIS — R252 Cramp and spasm: Secondary | ICD-10-CM | POA: Insufficient documentation

## 2019-05-23 DIAGNOSIS — M542 Cervicalgia: Secondary | ICD-10-CM | POA: Insufficient documentation

## 2019-05-23 NOTE — Therapy (Signed)
[place], [place], [address] Phone: [phone]   Fax:  [phone]  Physical Therapy Evaluation  Patient Details  Name: Aaron Dennis MRN: [id number] Date of Birth: [birth year] No data recorded  Encounter Date: 05/23/2019  PT End of Session - 05/23/19 1245    Visit Number  1    Number of Visits  8    Date for PT Re-Evaluation  06/20/19    PT Start Time  1000    PT Stop Time  1045    PT Time Calculation (min)  45 min    Activity Tolerance  Patient tolerated treatment well    Behavior During Therapy  [facility] for tasks assessed/performed       Past Medical History:  Diagnosis Date  . GERD (gastroesophageal reflux disease)   . Hypertension   . Lung nodule    7 mm LUL nodule    History reviewed. No pertinent surgical history.  There were no vitals filed for this visit.   Subjective Assessment - 05/23/19 0956    Subjective  Pt reports long history of back pain in right side of low back, as well as R shoulder and posterior neck pain. 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 His car seat is low and it makes it difficult to stand up straight and walk after sitting for prolonged periods up to 6-10 hours.    Limitations  Standing;Walking    How long can you sit comfortably?  usually as long as he wants, as long he is in a neutral position    How long can you stand comfortably?  hurts immediately    How long can you walk comfortably?  goes away once he gets going    Diagnostic tests  MRI    Patient Stated Goals  Wants to feel better with less pain    Currently in Pain?  Yes    Pain Score  7     Pain Location  Back    Pain Orientation  Right;Lower    Pain Type  Chronic pain    Pain Onset  More than a month ago    Pain Frequency  Several days a week    Aggravating Factors   R sidelying at night, standing, walking    Pain Relieving Factors  Supine lying,  Tramadol PRN (tries not to use that), Meloxicam PRN         [facility] PT Assessment - 05/23/19 0001      Balance Screen   Has the patient fallen in the past 6 months  No    Has the patient had a decrease in activity level because of a fear of falling?   No    Is the patient reluctant to leave their home because of a fear of falling?   No      ROM / Strength   AROM / PROM / Strength  AROM      AROM   AROM Assessment Site  Lumbar    Lumbar Flexion  40   hinging from hips, R lumbar pain   Lumbar Extension  25   improved pain with repeated motions   Lumbar - Right Side Bend  25   improved pain with repeated motion   Lumbar - Left Side Bend  20   reproduced R-sided pain   Lumbar - Right Rotation  limited by 75%  Lumbar - Left Rotation  limited by 75%       Flexibility   Soft Tissue Assessment /Muscle Length  yes    Hamstrings  R -40 degrees from 180, L -50 degrees from 180    Quadriceps  R quad soft tissue restriction with PKB      Palpation   Spinal mobility  hypomobile T/S and L/S    Palpation comment  hypomobility T/S and L/S                Objective measurements completed on examination: See above findings.      [facility] Adult PT Treatment/Exercise - 05/23/19 0001      Exercises   Exercises  Lumbar;Knee/Hip      Lumbar Exercises: Stretches   Passive Hamstring Stretch Limitations  add next visit    Standing Extension  10 reps    Standing Extension Limitations  2 sets    Prone on Elbows Stretch Limitations  add next visit      Knee/Hip Exercises: Stretches   Other Knee/Hip Stretches  Supine Figure 4 R hip 3 x 30 seconds             PT Education - 05/23/19 1245    Education Details  Diagnosis, prognosis, HEP, POC    Person(s) Educated  Patient    Methods  Explanation;Demonstration;Tactile cues    Comprehension  Verbalized understanding;Need further instruction;Returned demonstration       PT Short Term Goals - 05/23/19 1258      PT SHORT TERM  GOAL #1   Title  Pt will be I and compliant with initial HEP.    Time  1    Period  Weeks    Target Date  05/30/19      PT SHORT TERM GOAL #2   Title  Pt will increase HS length to <30 from neutral via popliteal angle.    Baseline  -50 R. -40 L    Time  3    Period  Weeks    Status  New    Target Date  06/13/19      PT SHORT TERM GOAL #3   Title  Pt will report no radicular symptoms down RLE, indicating centralization of LBP.    Baseline  peripheralizes with L lateral flexion    Time  2    Period  Weeks    Status  New    Target Date  06/06/19        PT Long Term Goals - 05/23/19 1300      PT LONG TERM GOAL #1   Title  Pt will demonstrate pain free lumbar AROM WFL.    Baseline  pain with all lumbar motions except R lateral flexion    Time  4    Period  Weeks    Status  New    Target Date  06/20/19      PT LONG TERM GOAL #2   Title  Pt will decrease pain to <4/10 at rest.    Baseline  7-8/10    Time  4    Period  Weeks    Status  New    Target Date  06/20/19      PT LONG TERM GOAL #3   Title  Pt will be compliant with and understand progressions for advanced HEP for long term maintenance.    Time  4    Period  Weeks    Status  New    Target Date  06/20/19             Plan - 05/23/19 1246    Clinical Impression Statement  Pt is a 64 yo male who presents with history of chronic back pain, as well as c/o R shoulder and B foot pain. 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 Pain was reproduced in flexion and L lateral flexion today, indicating potential for lumbar derangement. Pain was reduced by repeated extension and R lateral flexion with joint closure. Pt was educated on diagnosis, prognosis, POC, and HEP and verbalized understanding and consent to treatment. He is appropriate for a round of skilled physical therapy to address impairments and reduce pain.    Personal Factors and Comorbidities  Comorbidity 1;Sex;Education;Age;Social  Background;Time since onset of injury/illness/exacerbation;Profession;Past/Current Experience;Fitness    Comorbidities  HTN    Examination-Activity Limitations  Sit;Sleep;Lift;Bend;Locomotion Level;Stand    Examination-Participation Restrictions  Driving;Community Activity    Stability/Clinical Decision Making  Stable/Uncomplicated    Clinical Decision Making  Low    Rehab Potential  Good    PT Frequency  2x / week    PT Duration  4 weeks    PT Treatment/Interventions  Spinal Manipulations;Joint Manipulations;Cryotherapy;Moist Heat;Iontophoresis 4mg /ml Dexamethasone;Therapeutic activities;Functional mobility training;Neuromuscular re-education;Balance training;Therapeutic exercise;Dry needling;Passive range of motion;Patient/family education;Manual techniques;Taping;Vasopneumatic Device    PT Next Visit Plan  Assess response to extension biased HEP, progress into further extension if appropriate, add HS stretching and POE, maybe pressups    PT Home Exercise Plan  [id number]: standing extensions, supine/seated figure 4 stretching    Consulted and Agree with Plan of Care  Patient       Patient will benefit from skilled therapeutic intervention in order to improve the following deficits and impairments:  Decreased activity tolerance, Hypomobility, Decreased mobility, Decreased endurance, Decreased range of motion, Impaired perceived functional ability, Improper body mechanics, Postural dysfunction, Pain, Impaired flexibility, Increased fascial restricitons  Visit Diagnosis: Chronic bilateral low back pain with right-sided sciatica - Plan: PT plan of care cert/re-cert  Abnormal posture - Plan: PT plan of care cert/re-cert     Problem List Patient Active Problem List   Diagnosis Date Noted  . Primary osteoarthritis of left knee 01/13/2019  . Primary osteoarthritis of right knee 01/13/2019  . Flat foot 11/28/2017  . Tendonitis of foot 11/28/2017  . Polycythemia 05/12/2017  . Vitamin D  deficiency 04/21/2017  . Bony sclerosis 08/05/2016  . Lung nodule   . Chronic pain of both knees 12/05/2015  . Gastroesophageal reflux disease without esophagitis 05/17/2014  . Essential hypertension 05/17/2014  . Coronary artery calcification 08/17/2012  . Chronic abdominal pain 06/24/2012  . Right-sided chest wall pain 06/24/2012  . Constipation, chronic 06/24/2012  . Hyperbilirubinemia 06/24/2012  . Chronic back pain 06/24/2012  . DDD (degenerative disc disease), thoracolumbar 06/24/2012  . History of nephrolithiasis 06/24/2012  . Side pain 06/24/2012  . Low back pain at multiple sites 06/24/2012    Izell Woodstock, PT, DPT 05/23/2019, 1:07 PM  [place], [place], [address] Phone: [phone]   Fax:  [phone]  Name: Aaron Dennis MRN: [id number] Date of Birth: [birth year]

## 2019-05-29 ENCOUNTER — Encounter: Payer: Self-pay | Admitting: Physical Therapy

## 2019-05-30 ENCOUNTER — Ambulatory Visit: Payer: No Typology Code available for payment source | Admitting: Sports Medicine

## 2019-06-01 ENCOUNTER — Other Ambulatory Visit: Payer: Self-pay

## 2019-06-01 ENCOUNTER — Ambulatory Visit: Payer: Self-pay | Admitting: Physical Therapy

## 2019-06-01 ENCOUNTER — Encounter: Payer: Self-pay | Admitting: Physical Therapy

## 2019-06-01 DIAGNOSIS — R293 Abnormal posture: Secondary | ICD-10-CM

## 2019-06-01 DIAGNOSIS — R252 Cramp and spasm: Secondary | ICD-10-CM

## 2019-06-01 DIAGNOSIS — G8929 Other chronic pain: Secondary | ICD-10-CM

## 2019-06-01 NOTE — Therapy (Signed)
[place], [place], [address] Phone: [phone]   Fax:  [phone]  Physical Therapy Treatment  Patient Details  Name: Aaron Dennis MRN: [id number] Date of Birth: 1955-10-27 No data recorded  Encounter Date: 06/01/2019  PT End of Session - 06/01/19 1701    Visit Number  2    Number of Visits  8    Date for PT Re-Evaluation  06/20/19    PT Start Time  1622    PT Stop Time  1705    PT Time Calculation (min)  43 min    Activity Tolerance  Patient tolerated treatment well    Behavior During Therapy  [facility] for tasks assessed/performed       Past Medical History:  Diagnosis Date  . GERD (gastroesophageal reflux disease)   . Hypertension   . Lung nodule    7 mm LUL nodule    History reviewed. No pertinent surgical history.  There were no vitals filed for this visit.  Subjective Assessment - 06/01/19 1626    Subjective  Pt reports he is having a lot of low back pain today but that it is not radiating down his RLE. Pt reports he has not done HEP very much d/t being out of town.    Patient Stated Goals  Wants to feel better with less pain    Currently in Pain?  Yes    Pain Score  7     Pain Location  Back    Pain Orientation  Right;Lower                       [facility] Adult PT Treatment/Exercise - 06/01/19 0001      Lumbar Exercises: Stretches   Active Hamstring Stretch  Right;Left;30 seconds    Active Hamstring Stretch Limitations  cues for form    Passive Hamstring Stretch  Right;Left;60 seconds    Prone on Elbows Stretch  5 reps;10 seconds      Lumbar Exercises: Aerobic   Nustep  L5 x 6 min      Lumbar Exercises: Supine   Bridge  10 reps;3 seconds    Bridge Limitations  cues for form      Manual Therapy   Manual Therapy  Joint mobilization;Soft tissue mobilization    Joint Mobilization  gentle grade II-III mobs to lumbar spine. gentle rib mobilizations to thoracic  area    Soft tissue mobilization  to the R thoracic and lumbar paraspinals             PT Education - 06/01/19 1701    Education Details  Pt educated on modifications for hamstring stretch (supine v seated)    Person(s) Educated  Patient    Comprehension  Verbalized understanding       PT Short Term Goals - 05/23/19 1258      PT SHORT TERM GOAL #1   Title  Pt will be I and compliant with initial HEP.    Time  1    Period  Weeks    Target Date  05/30/19      PT SHORT TERM GOAL #2   Title  Pt will increase HS length to <30 from neutral via popliteal angle.    Baseline  -50 R. -40 L    Time  3    Period  Weeks    Status  New    Target Date  06/13/19  PT SHORT TERM GOAL #3   Title  Pt will report no radicular symptoms down RLE, indicating centralization of LBP.    Baseline  peripheralizes with L lateral flexion    Time  2    Period  Weeks    Status  New    Target Date  06/06/19        PT Long Term Goals - 05/23/19 1300      PT LONG TERM GOAL #1   Title  Pt will demonstrate pain free lumbar AROM WFL.    Baseline  pain with all lumbar motions except R lateral flexion    Time  4    Period  Weeks    Status  New    Target Date  06/20/19      PT LONG TERM GOAL #2   Title  Pt will decrease pain to <4/10 at rest.    Baseline  7-8/10    Time  4    Period  Weeks    Status  New    Target Date  06/20/19      PT LONG TERM GOAL #3   Title  Pt will be compliant with and understand progressions for advanced HEP for long term maintenance.    Time  4    Period  Weeks    Status  New    Target Date  06/20/19            Plan - 06/01/19 1702    Clinical Impression Statement  Pt tolerated ex's well; pt reports decrease in pain with prone press up to elbows. Mobilization to the lumbar spine illicited pt reports of pain. Continue to progress lumbar ROM, flexibility, and strengthening ex's.    PT Treatment/Interventions  Spinal Manipulations;Joint  Manipulations;Cryotherapy;Moist Heat;Iontophoresis 4mg /ml Dexamethasone;Therapeutic activities;Functional mobility training;Neuromuscular re-education;Balance training;Therapeutic exercise;Dry needling;Passive range of motion;Patient/family education;Manual techniques;Taping;Vasopneumatic Device    PT Next Visit Plan  Assess response to extension biased HEP, progress into further extension if appropriate. Lumbar stabilization, ROM, and LE strengthening/flexibility    PT Home Exercise Plan  [id number]: standing extensions, supine/seated figure 4 stretching, hamstring stretch    Consulted and Agree with Plan of Care  Patient       Patient will benefit from skilled therapeutic intervention in order to improve the following deficits and impairments:  Decreased activity tolerance, Hypomobility, Decreased mobility, Decreased endurance, Decreased range of motion, Impaired perceived functional ability, Improper body mechanics, Postural dysfunction, Pain, Impaired flexibility, Increased fascial restricitons  Visit Diagnosis: Chronic bilateral low back pain with right-sided sciatica  Abnormal posture  Cramp and spasm     Problem List Patient Active Problem List   Diagnosis Date Noted  . Primary osteoarthritis of left knee 01/13/2019  . Primary osteoarthritis of right knee 01/13/2019  . Flat foot 11/28/2017  . Tendonitis of foot 11/28/2017  . Polycythemia 05/12/2017  . Vitamin D deficiency 04/21/2017  . Bony sclerosis 08/05/2016  . Lung nodule   . Chronic pain of both knees 12/05/2015  . Gastroesophageal reflux disease without esophagitis 05/17/2014  . Essential hypertension 05/17/2014  . Coronary artery calcification 08/17/2012  . Chronic abdominal pain 06/24/2012  . Right-sided chest wall pain 06/24/2012  . Constipation, chronic 06/24/2012  . Hyperbilirubinemia 06/24/2012  . Chronic back pain 06/24/2012  . DDD (degenerative disc disease), thoracolumbar 06/24/2012  . History of  nephrolithiasis 06/24/2012  . Side pain 06/24/2012  . Low back pain at multiple sites 06/24/2012   Amador Cunas, PT, DPT Donald Prose Dannette Kinkaid 06/01/2019, 5:05  PM  [place], [place], [address] Phone: [phone]   Fax:  [phone]  Name: Aaron Dennis MRN: [id number] Date of Birth: 02/05/1956

## 2019-06-05 ENCOUNTER — Other Ambulatory Visit: Payer: Self-pay

## 2019-06-05 ENCOUNTER — Ambulatory Visit: Payer: Self-pay | Admitting: Physical Therapy

## 2019-06-05 DIAGNOSIS — M545 Low back pain, unspecified: Secondary | ICD-10-CM

## 2019-06-05 DIAGNOSIS — G8929 Other chronic pain: Secondary | ICD-10-CM

## 2019-06-05 DIAGNOSIS — M542 Cervicalgia: Secondary | ICD-10-CM

## 2019-06-05 DIAGNOSIS — R252 Cramp and spasm: Secondary | ICD-10-CM

## 2019-06-05 DIAGNOSIS — R293 Abnormal posture: Secondary | ICD-10-CM

## 2019-06-05 NOTE — Therapy (Signed)
[place], [place], [address] Phone: [phone]   Fax:  [phone]  Physical Therapy Treatment  Patient Details  Name: Aaron Dennis MRN: [id number] Date of Birth: 06/04/1955 No data recorded  Encounter Date: 06/05/2019  PT End of Session - 06/05/19 [id number]    Visit Number  3    Number of Visits  8    Date for PT Re-Evaluation  06/20/19    PT Start Time  0845    PT Stop Time  0936    PT Time Calculation (min)  51 min    Activity Tolerance  Patient tolerated treatment well    Behavior During Therapy  [facility] for tasks assessed/performed       Past Medical History:  Diagnosis Date  . GERD (gastroesophageal reflux disease)   . Hypertension   . Lung nodule    7 mm LUL nodule    No past surgical history on file.  There were no vitals filed for this visit.  Subjective Assessment - 06/05/19 0843    Subjective  Pt report pain in his LB and R hip, he has been doing some of his exercises, says he cannot sleep on his R side    Currently in Pain?  Yes    Pain Score  6     Pain Location  Back    Pain Orientation  Right                       [facility] Adult PT Treatment/Exercise - 06/05/19 0001      Exercises   Exercises  Shoulder      Lumbar Exercises: Aerobic   Nustep  L5 x 6 min      Lumbar Exercises: Seated   Other Seated Lumbar Exercises  isometric abs w/ ball,   15 rep 3 sec hold     Lumbar Exercises: Supine   Clam  20 reps   red tband, cues for slow, controlled motion   Bridge  10 reps;3 seconds    Other Supine Lumbar Exercises  knee to chest stretch both 20 sec hold 3 reps    Other Supine Lumbar Exercises  knee to chest 2x10      Lumbar Exercises: Prone   Other Prone Lumbar Exercises  hip ext 2x10,    2# cuf weight 2nd set   Other Prone Lumbar Exercises  lumb ext, 2x10      Shoulder Exercises: Standing   Other Standing Exercises  shld flex w/ ball, touching the  wall, 2x15      Shoulder Exercises: ROM/Strengthening   Lat Pull  2.5 plate   579FGE   Cybex Row  2.5 plate   579FGE              PT Short Term Goals - 06/05/19 0941      PT SHORT TERM GOAL #2   Time  3        PT Long Term Goals - 05/23/19 1300      PT LONG TERM GOAL #1   Title  Pt will demonstrate pain free lumbar AROM WFL.    Baseline  pain with all lumbar motions except R lateral flexion    Time  4    Period  Weeks    Status  New    Target Date  06/20/19      PT LONG TERM GOAL #2   Title  Pt will  decrease pain to <4/10 at rest.    Baseline  7-8/10    Time  4    Period  Weeks    Status  New    Target Date  06/20/19      PT LONG TERM GOAL #3   Title  Pt will be compliant with and understand progressions for advanced HEP for long term maintenance.    Time  4    Period  Weeks    Status  New    Target Date  06/20/19            Plan - 06/05/19 [id number]    Clinical Impression Statement  cuing needed for eccentric controlling of motion during lat pulls and hip ext, he reported a decrease in pain while doing the knee to chest stretches, pt performed the exercises well    Stability/Clinical Decision Making  Stable/Uncomplicated    PT Frequency  2x / week    PT Duration  4 weeks    PT Treatment/Interventions  Spinal Manipulations;Joint Manipulations;Cryotherapy;Moist Heat;Iontophoresis 4mg /ml Dexamethasone;Therapeutic activities;Functional mobility training;Neuromuscular re-education;Balance training;Therapeutic exercise;Dry needling;Passive range of motion;Patient/family education;Manual techniques;Taping;Vasopneumatic Device    PT Next Visit Plan  progress into further extension if appropriate. Lumbar stabilization, ROM, and LE strengthening/flexibility       Patient will benefit from skilled therapeutic intervention in order to improve the following deficits and impairments:  Decreased activity tolerance, Hypomobility, Decreased mobility, Decreased endurance,  Decreased range of motion, Impaired perceived functional ability, Improper body mechanics, Postural dysfunction, Pain, Impaired flexibility, Increased fascial restricitons  Visit Diagnosis: Chronic bilateral low back pain with right-sided sciatica  Abnormal posture  Cramp and spasm  Cervicalgia  Chronic bilateral low back pain without sciatica     Problem List Patient Active Problem List   Diagnosis Date Noted  . Primary osteoarthritis of left knee 01/13/2019  . Primary osteoarthritis of right knee 01/13/2019  . Flat foot 11/28/2017  . Tendonitis of foot 11/28/2017  . Polycythemia 05/12/2017  . Vitamin D deficiency 04/21/2017  . Bony sclerosis 08/05/2016  . Lung nodule   . Chronic pain of both knees 12/05/2015  . Gastroesophageal reflux disease without esophagitis 05/17/2014  . Essential hypertension 05/17/2014  . Coronary artery calcification 08/17/2012  . Chronic abdominal pain 06/24/2012  . Right-sided chest wall pain 06/24/2012  . Constipation, chronic 06/24/2012  . Hyperbilirubinemia 06/24/2012  . Chronic back pain 06/24/2012  . DDD (degenerative disc disease), thoracolumbar 06/24/2012  . History of nephrolithiasis 06/24/2012  . Side pain 06/24/2012  . Low back pain at multiple sites 06/24/2012    Scot Jun 06/05/2019, 10:13 AM  [place], [place], [address] Phone: [phone]   Fax:  [phone]  Name: Aaron Dennis MRN: [id number] Date of Birth: [birth year]

## 2019-06-05 NOTE — Therapy (Deleted)
[place], [place], [address] Phone: [phone]   Fax:  [phone]  Physical Therapy Treatment  Patient Details  Name: Aaron Dennis MRN: [id number] Date of Birth: 11/28/1955 No data recorded  Encounter Date: 06/05/2019  PT End of Session - 06/05/19 [id number]    Visit Number  3    Number of Visits  8    Date for PT Re-Evaluation  06/20/19    PT Start Time  0845    PT Stop Time  0936    PT Time Calculation (min)  51 min    Activity Tolerance  Patient tolerated treatment well    Behavior During Therapy  [facility] for tasks assessed/performed       Past Medical History:  Diagnosis Date  . GERD (gastroesophageal reflux disease)   . Hypertension   . Lung nodule    7 mm LUL nodule    No past surgical history on file.  There were no vitals filed for this visit.  Subjective Assessment - 06/05/19 0843    Subjective  Pt report pain in his LB and R hip, he has been doing some of his exercises, says he cannot sleep on his R side    Currently in Pain?  Yes    Pain Score  6     Pain Location  Back    Pain Orientation  Right                       [facility] Adult PT Treatment/Exercise - 06/05/19 0001      Exercises   Exercises  Shoulder      Lumbar Exercises: Aerobic   Nustep  L5 x 6 min      Lumbar Exercises: Seated   Other Seated Lumbar Exercises  isometric abs w/ ball,   15 rep 3 sec hold     Lumbar Exercises: Supine   Clam  20 reps   red tband, cues for slow, controlled motion   Bridge  10 reps;3 seconds    Other Supine Lumbar Exercises  knee to chest stretch both 20 sec hold 3 reps    Other Supine Lumbar Exercises  knee to chest 2x10      Lumbar Exercises: Prone   Other Prone Lumbar Exercises  hip ext 2x10,    2# cuf weight 2nd set   Other Prone Lumbar Exercises  lumb ext, 2x10      Shoulder Exercises: Standing   Other Standing Exercises  shld flex w/ ball, touching the  wall, 2x15      Shoulder Exercises: ROM/Strengthening   Lat Pull  2.5 plate   579FGE   Cybex Row  2.5 plate   579FGE              PT Short Term Goals - 06/05/19 0941      PT SHORT TERM GOAL #2   Time  3        PT Long Term Goals - 05/23/19 1300      PT LONG TERM GOAL #1   Title  Pt will demonstrate pain free lumbar AROM WFL.    Baseline  pain with all lumbar motions except R lateral flexion    Time  4    Period  Weeks    Status  New    Target Date  06/20/19      PT LONG TERM GOAL #2   Title  Pt will  decrease pain to <4/10 at rest.    Baseline  7-8/10    Time  4    Period  Weeks    Status  New    Target Date  06/20/19      PT LONG TERM GOAL #3   Title  Pt will be compliant with and understand progressions for advanced HEP for long term maintenance.    Time  4    Period  Weeks    Status  New    Target Date  06/20/19            Plan - 06/05/19 [id number]    Clinical Impression Statement  cuing needed for eccentric controlling of motion during lat pulls and hip ext, he reported a decrease in pain while doing the knee to chest stretches, pt performed the exercises well       Patient will benefit from skilled therapeutic intervention in order to improve the following deficits and impairments:  Decreased activity tolerance, Hypomobility, Decreased mobility, Decreased endurance, Decreased range of motion, Impaired perceived functional ability, Improper body mechanics, Postural dysfunction, Pain, Impaired flexibility, Increased fascial restricitons  Visit Diagnosis: Chronic bilateral low back pain with right-sided sciatica  Abnormal posture  Cramp and spasm  Cervicalgia  Chronic bilateral low back pain without sciatica     Problem List Patient Active Problem List   Diagnosis Date Noted  . Primary osteoarthritis of left knee 01/13/2019  . Primary osteoarthritis of right knee 01/13/2019  . Flat foot 11/28/2017  . Tendonitis of foot 11/28/2017  .  Polycythemia 05/12/2017  . Vitamin D deficiency 04/21/2017  . Bony sclerosis 08/05/2016  . Lung nodule   . Chronic pain of both knees 12/05/2015  . Gastroesophageal reflux disease without esophagitis 05/17/2014  . Essential hypertension 05/17/2014  . Coronary artery calcification 08/17/2012  . Chronic abdominal pain 06/24/2012  . Right-sided chest wall pain 06/24/2012  . Constipation, chronic 06/24/2012  . Hyperbilirubinemia 06/24/2012  . Chronic back pain 06/24/2012  . DDD (degenerative disc disease), thoracolumbar 06/24/2012  . History of nephrolithiasis 06/24/2012  . Side pain 06/24/2012  . Low back pain at multiple sites 06/24/2012    Clarene Essex, SPTA 06/05/2019, 9:45 AM  [place], [place], [address] Phone: [phone]   Fax:  [phone]  Name: Aaron Dennis MRN: [id number] Date of Birth: 1955/07/03

## 2019-06-06 ENCOUNTER — Ambulatory Visit: Payer: Self-pay

## 2019-06-08 ENCOUNTER — Encounter: Payer: Self-pay | Admitting: Physical Therapy

## 2019-06-08 ENCOUNTER — Other Ambulatory Visit: Payer: Self-pay

## 2019-06-08 ENCOUNTER — Ambulatory Visit: Payer: Self-pay | Admitting: Physical Therapy

## 2019-06-08 DIAGNOSIS — M5441 Lumbago with sciatica, right side: Secondary | ICD-10-CM

## 2019-06-08 DIAGNOSIS — G8929 Other chronic pain: Secondary | ICD-10-CM

## 2019-06-08 DIAGNOSIS — R293 Abnormal posture: Secondary | ICD-10-CM

## 2019-06-08 DIAGNOSIS — R252 Cramp and spasm: Secondary | ICD-10-CM

## 2019-06-08 DIAGNOSIS — M545 Low back pain, unspecified: Secondary | ICD-10-CM

## 2019-06-08 NOTE — Therapy (Signed)
[place], [place], [address] Phone: [phone]   Fax:  [phone]  Physical Therapy Treatment  Patient Details  Name: Aaron Dennis MRN: [id number] Date of Birth: 1956-01-23 No data recorded  Encounter Date: 06/08/2019  PT End of Session - 06/08/19 0926    Visit Number  4    Date for PT Re-Evaluation  06/20/19    PT Start Time  0845    PT Stop Time  0926    PT Time Calculation (min)  41 min    Activity Tolerance  Patient tolerated treatment well    Behavior During Therapy  [facility] for tasks assessed/performed       Past Medical History:  Diagnosis Date  . GERD (gastroesophageal reflux disease)   . Hypertension   . Lung nodule    7 mm LUL nodule    History reviewed. No pertinent surgical history.  There were no vitals filed for this visit.  Subjective Assessment - 06/08/19 0849    Subjective  Pt reported in R glute area and R heel, cant sleep on R side    Currently in Pain?  Yes    Pain Score  7     Pain Location  Back    Pain Orientation  Right;Lower                       [facility] Adult PT Treatment/Exercise - 06/08/19 0001      Lumbar Exercises: Aerobic   UBE (Upper Arm Bike)  L2 x 2 min each way    Recumbent Bike  L0 x 5 min      Lumbar Exercises: Standing   Other Standing Lumbar Exercises  Shoulder Ext 5lb 2x10     Other Standing Lumbar Exercises  Resisted side step 30lb x 4 each      Knee/Hip Exercises: Machines for Strengthening   Cybex Knee Extension  5lb 2x10     Cybex Knee Flexion  20lb 2x10     Cybex Leg Press  20lb 2x10                PT Short Term Goals - 06/05/19 0941      PT SHORT TERM GOAL #2   Time  3        PT Long Term Goals - 05/23/19 1300      PT LONG TERM GOAL #1   Title  Pt will demonstrate pain free lumbar AROM WFL.    Baseline  pain with all lumbar motions except R lateral flexion    Time  4    Period  Weeks    Status   New    Target Date  06/20/19      PT LONG TERM GOAL #2   Title  Pt will decrease pain to <4/10 at rest.    Baseline  7-8/10    Time  4    Period  Weeks    Status  New    Target Date  06/20/19      PT LONG TERM GOAL #3   Title  Pt will be compliant with and understand progressions for advanced HEP for long term maintenance.    Time  4    Period  Weeks    Status  New    Target Date  06/20/19            Plan - 06/08/19 0926    Clinical Impression  Statement  Pt appeared tired and fatigue today. He reports that's he is fasting and has not eaten since 12 am this morning. He did well with controlled interventions on machine. Cues needed for postural control the shoulder ext.    Personal Factors and Co morbidities  Comorbidity 1;Sex;Education;Age;Social Background;Time since onset of injury/illness/exacerbation;Profession;Past/Current Experience;Fitness    Comorbidities  HTN    Examination-Activity Limitations  Sit;Sleep;Lift;Bend;Locomotion Level;Stand    Examination-Participation Restrictions  Driving;Community Activity    Stability/Clinical Decision Making  Stable/Uncomplicated    Rehab Potential  Good    PT Frequency  2x / week    PT Treatment/Interventions  Spinal Manipulations;Joint Manipulations;Cryotherapy;Moist Heat;Iontophoresis 4mg /ml Dexamethasone;Therapeutic activities;Functional mobility training;Neuromuscular re-education;Balance training;Therapeutic exercise;Dry needling;Passive range of motion;Patient/family education;Manual techniques;Taping;Vasopneumatic Device    PT Next Visit Plan  progress into further extension if appropriate. Lumbar stabilization, ROM, and LE strengthening/flexibility       Patient will benefit from skilled therapeutic intervention in order to improve the following deficits and impairments:  Decreased activity tolerance, Hypomobility, Decreased mobility, Decreased endurance, Decreased range of motion, Impaired perceived functional ability,  Improper body mechanics, Postural dysfunction, Pain, Impaired flexibility, Increased fascial restricitons  Visit Diagnosis: Abnormal posture  Chronic bilateral low back pain with right-sided sciatica  Cramp and spasm  Chronic bilateral low back pain without sciatica     Problem List Patient Active Problem List   Diagnosis Date Noted  . Primary osteoarthritis of left knee 01/13/2019  . Primary osteoarthritis of right knee 01/13/2019  . Flat foot 11/28/2017  . Tendonitis of foot 11/28/2017  . Polycythemia 05/12/2017  . Vitamin D deficiency 04/21/2017  . Bony sclerosis 08/05/2016  . Lung nodule   . Chronic pain of both knees 12/05/2015  . Gastroesophageal reflux disease without esophagitis 05/17/2014  . Essential hypertension 05/17/2014  . Coronary artery calcification 08/17/2012  . Chronic abdominal pain 06/24/2012  . Right-sided chest wall pain 06/24/2012  . Constipation, chronic 06/24/2012  . Hyperbilirubinemia 06/24/2012  . Chronic back pain 06/24/2012  . DDD (degenerative disc disease), thoracolumbar 06/24/2012  . History of nephrolithiasis 06/24/2012  . Side pain 06/24/2012  . Low back pain at multiple sites 06/24/2012    Scot Jun, PTA 06/08/2019, 9:33 AM  [place], [place], [address] Phone: [phone]   Fax:  [phone]  Name: RYMAN STFLEUR MRN: [id number] Date of Birth: 07-Mar-1955

## 2019-06-09 MED FILL — MELOXICAM 7.5 MG TABLET: 7.5 | 30 days supply | Qty: 30 | Fill #3

## 2019-06-09 MED FILL — AMLODIPINE BESYLATE 5 MG TA: 5 | 30 days supply | Qty: 30 | Fill #3

## 2019-06-13 ENCOUNTER — Other Ambulatory Visit: Payer: Self-pay

## 2019-06-13 ENCOUNTER — Ambulatory Visit: Payer: Self-pay | Attending: Family Medicine | Admitting: Physical Therapy

## 2019-06-13 ENCOUNTER — Encounter: Payer: Self-pay | Admitting: Physical Therapy

## 2019-06-13 DIAGNOSIS — R252 Cramp and spasm: Secondary | ICD-10-CM | POA: Insufficient documentation

## 2019-06-13 DIAGNOSIS — G8929 Other chronic pain: Secondary | ICD-10-CM | POA: Insufficient documentation

## 2019-06-13 DIAGNOSIS — R293 Abnormal posture: Secondary | ICD-10-CM | POA: Insufficient documentation

## 2019-06-13 DIAGNOSIS — M5441 Lumbago with sciatica, right side: Secondary | ICD-10-CM | POA: Insufficient documentation

## 2019-06-13 DIAGNOSIS — M545 Low back pain: Secondary | ICD-10-CM | POA: Insufficient documentation

## 2019-06-13 NOTE — Therapy (Signed)
[place], [place], [address] Phone: [phone]   Fax:  [phone]  Physical Therapy Treatment  Patient Details  Name: Aaron Dennis MRN: [id number] Date of Birth: 01/24/1956 No data recorded  Encounter Date: 06/13/2019  PT End of Session - 06/13/19 1057    Visit Number  5    Number of Visits  8    Date for PT Re-Evaluation  06/20/19    PT Start Time  [id number]    PT Stop Time  1100    PT Time Calculation (min)  45 min    Activity Tolerance  Patient limited by fatigue    Behavior During Therapy  [facility] for tasks assessed/performed       Past Medical History:  Diagnosis Date  . GERD (gastroesophageal reflux disease)   . Hypertension   . Lung nodule    7 mm LUL nodule    History reviewed. No pertinent surgical history.  There were no vitals filed for this visit.  Subjective Assessment - 06/13/19 1030    Subjective  Pt reports he cannot sleep on R side; states R glute/LBP is slightly better    Currently in Pain?  Yes    Pain Score  6     Pain Location  Back    Pain Orientation  Right;Lower                       [facility] Adult PT Treatment/Exercise - 06/13/19 0001      Lumbar Exercises: Aerobic   Nustep  L6 x 8 min      Knee/Hip Exercises: Machines for Strengthening   Cybex Knee Extension  10# 2x10    Cybex Knee Flexion  25# 2x10    Cybex Leg Press  40# 2x10 BLE, 20# 1x10 LLE/RLE    Other Machine  heel raises 20# 1x15      Shoulder Exercises: ROM/Strengthening   Lat Pull  10 reps    Lat Pull Limitations  35# lat pulldown 1x10    Cybex Row  10 reps    Cybex Row Limitations  20# 1x10               PT Short Term Goals - 06/05/19 0941      PT SHORT TERM GOAL #2   Time  3        PT Long Term Goals - 05/23/19 1300      PT LONG TERM GOAL #1   Title  Pt will demonstrate pain free lumbar AROM WFL.    Baseline  pain with all lumbar motions except R lateral  flexion    Time  4    Period  Weeks    Status  New    Target Date  06/20/19      PT LONG TERM GOAL #2   Title  Pt will decrease pain to <4/10 at rest.    Baseline  7-8/10    Time  4    Period  Weeks    Status  New    Target Date  06/20/19      PT LONG TERM GOAL #3   Title  Pt will be compliant with and understand progressions for advanced HEP for long term maintenance.    Time  4    Period  Weeks    Status  New    Target Date  06/20/19  Plan - 06/13/19 1058    Clinical Impression Statement  Pt tired and fatigued today; fasting again and has not eaten since 12am. Pt again did well on controlled interventions on machine; required some verbal/tactile cuing to maintain attention. Pt reported no LBP by end of session. Continue to progress ex's.    PT Treatment/Interventions  Spinal Manipulations;Joint Manipulations;Cryotherapy;Moist Heat;Iontophoresis 4mg /ml Dexamethasone;Therapeutic activities;Functional mobility training;Neuromuscular re-education;Balance training;Therapeutic exercise;Dry needling;Passive range of motion;Patient/family education;Manual techniques;Taping;Vasopneumatic Device    PT Next Visit Plan  progress into further extension if appropriate. Lumbar stabilization, ROM, and LE strengthening/flexibility    PT Home Exercise Plan  [id number]: standing extensions, supine/seated figure 4 stretching, hamstring stretch    Consulted and Agree with Plan of Care  Patient       Patient will benefit from skilled therapeutic intervention in order to improve the following deficits and impairments:  Decreased activity tolerance, Hypomobility, Decreased mobility, Decreased endurance, Decreased range of motion, Impaired perceived functional ability, Improper body mechanics, Postural dysfunction, Pain, Impaired flexibility, Increased fascial restricitons  Visit Diagnosis: Abnormal posture  Chronic bilateral low back pain with right-sided sciatica  Cramp and  spasm     Problem List Patient Active Problem List   Diagnosis Date Noted  . Primary osteoarthritis of left knee 01/13/2019  . Primary osteoarthritis of right knee 01/13/2019  . Flat foot 11/28/2017  . Tendonitis of foot 11/28/2017  . Polycythemia 05/12/2017  . Vitamin D deficiency 04/21/2017  . Bony sclerosis 08/05/2016  . Lung nodule   . Chronic pain of both knees 12/05/2015  . Gastroesophageal reflux disease without esophagitis 05/17/2014  . Essential hypertension 05/17/2014  . Coronary artery calcification 08/17/2012  . Chronic abdominal pain 06/24/2012  . Right-sided chest wall pain 06/24/2012  . Constipation, chronic 06/24/2012  . Hyperbilirubinemia 06/24/2012  . Chronic back pain 06/24/2012  . DDD (degenerative disc disease), thoracolumbar 06/24/2012  . History of nephrolithiasis 06/24/2012  . Side pain 06/24/2012  . Low back pain at multiple sites 06/24/2012   Amador Cunas, PT, DPT [facility] [place] 06/13/2019, 10:59 AM  [place], [place], [address] Phone: [phone]   Fax:  [phone]  Name: Aaron Dennis MRN: [id number] Date of Birth: 11-Jul-1955

## 2019-06-15 ENCOUNTER — Encounter: Payer: Self-pay | Admitting: Physical Therapy

## 2019-06-15 ENCOUNTER — Other Ambulatory Visit: Payer: Self-pay

## 2019-06-15 ENCOUNTER — Ambulatory Visit: Payer: Self-pay | Admitting: Physical Therapy

## 2019-06-15 DIAGNOSIS — R293 Abnormal posture: Secondary | ICD-10-CM

## 2019-06-15 DIAGNOSIS — G8929 Other chronic pain: Secondary | ICD-10-CM

## 2019-06-15 DIAGNOSIS — M545 Low back pain, unspecified: Secondary | ICD-10-CM

## 2019-06-15 DIAGNOSIS — R252 Cramp and spasm: Secondary | ICD-10-CM

## 2019-06-15 DIAGNOSIS — M5441 Lumbago with sciatica, right side: Secondary | ICD-10-CM

## 2019-06-15 NOTE — Therapy (Signed)
[place], [place], [address] Phone: [phone]   Fax:  [phone]  Physical Therapy Treatment  Patient Details  Name: Aaron Dennis MRN: [id number] Date of Birth: [birth year] No data recorded  Encounter Date: 06/15/2019  PT End of Session - 06/15/19 1108    Visit Number  6    Date for PT Re-Evaluation  06/20/19    PT Start Time  [id number]    PT Stop Time  1100    PT Time Calculation (min)  45 min    Activity Tolerance  Patient limited by fatigue    Behavior During Therapy  [facility] for tasks assessed/performed       Past Medical History:  Diagnosis Date  . GERD (gastroesophageal reflux disease)   . Hypertension   . Lung nodule    7 mm LUL nodule    History reviewed. No pertinent surgical history.  There were no vitals filed for this visit.  Subjective Assessment - 06/15/19 1027    Subjective  Pt reports pain in R LB is still there but better    Currently in Pain?  Yes    Pain Score  6     Pain Location  Back    Pain Orientation  Right;Lower                       [facility] Adult PT Treatment/Exercise - 06/15/19 0001      Lumbar Exercises: Aerobic   Nustep  L6 x 8 min      Lumbar Exercises: Machines for Strengthening   Cybex Lumbar Extension  x10 with black TB; cues for form      Lumbar Exercises: Standing   Other Standing Lumbar Exercises  Shoulder ext 10# 2x10    Other Standing Lumbar Exercises  Resisted side step 30lb x 3 each      Shoulder Exercises: ROM/Strengthening   Lat Pull  10 reps    Lat Pull Limitations  35# lat pulldown 2x10    Cybex Row  10 reps    Cybex Row Limitations  25# 2x10               PT Short Term Goals - 06/05/19 0941      PT SHORT TERM GOAL #2   Time  3        PT Long Term Goals - 05/23/19 1300      PT LONG TERM GOAL #1   Title  Pt will demonstrate pain free lumbar AROM WFL.    Baseline  pain with all lumbar motions except R  lateral flexion    Time  4    Period  Weeks    Status  New    Target Date  06/20/19      PT LONG TERM GOAL #2   Title  Pt will decrease pain to <4/10 at rest.    Baseline  7-8/10    Time  4    Period  Weeks    Status  New    Target Date  06/20/19      PT LONG TERM GOAL #3   Title  Pt will be compliant with and understand progressions for advanced HEP for long term maintenance.    Time  4    Period  Weeks    Status  New    Target Date  06/20/19            Plan -  06/15/19 1108    Clinical Impression Statement  Pt tired and fatigued today d/t fasting. Still tolerated standing ex's and controlled interventions on machine. Progressed TE with pt tolerating ex's. Continue to progress next rx.    PT Treatment/Interventions  Spinal Manipulations;Joint Manipulations;Cryotherapy;Moist Heat;Iontophoresis 4mg /ml Dexamethasone;Therapeutic activities;Functional mobility training;Neuromuscular re-education;Balance training;Therapeutic exercise;Dry needling;Passive range of motion;Patient/family education;Manual techniques;Taping;Vasopneumatic Device    PT Next Visit Plan  progress into further extension if appropriate. Lumbar stabilization, ROM, and LE strengthening/flexibility    Consulted and Agree with Plan of Care  Patient       Patient will benefit from skilled therapeutic intervention in order to improve the following deficits and impairments:  Decreased activity tolerance, Hypomobility, Decreased mobility, Decreased endurance, Decreased range of motion, Impaired perceived functional ability, Improper body mechanics, Postural dysfunction, Pain, Impaired flexibility, Increased fascial restricitons  Visit Diagnosis: Abnormal posture  Chronic bilateral low back pain with right-sided sciatica  Cramp and spasm  Chronic bilateral low back pain without sciatica     Problem List Patient Active Problem List   Diagnosis Date Noted  . Primary osteoarthritis of left knee 01/13/2019  .  Primary osteoarthritis of right knee 01/13/2019  . Flat foot 11/28/2017  . Tendonitis of foot 11/28/2017  . Polycythemia 05/12/2017  . Vitamin D deficiency 04/21/2017  . Bony sclerosis 08/05/2016  . Lung nodule   . Chronic pain of both knees 12/05/2015  . Gastroesophageal reflux disease without esophagitis 05/17/2014  . Essential hypertension 05/17/2014  . Coronary artery calcification 08/17/2012  . Chronic abdominal pain 06/24/2012  . Right-sided chest wall pain 06/24/2012  . Constipation, chronic 06/24/2012  . Hyperbilirubinemia 06/24/2012  . Chronic back pain 06/24/2012  . DDD (degenerative disc disease), thoracolumbar 06/24/2012  . History of nephrolithiasis 06/24/2012  . Side pain 06/24/2012  . Low back pain at multiple sites 06/24/2012   Amador Cunas, PT, DPT [facility] Prose Vincenzina Jagoda 06/15/2019, [place], [place], [address] Phone: [phone]   Fax:  [phone]  Name: Aaron Dennis MRN: [id number] Date of Birth: 1955/06/02

## 2019-06-16 MED FILL — OMEPRAZOLE DR 40 MG CAPSULE: 40 | 30 days supply | Qty: 30 | Fill #2

## 2019-06-23 ENCOUNTER — Other Ambulatory Visit: Payer: Self-pay | Admitting: Family Medicine

## 2019-06-23 DIAGNOSIS — K648 Other hemorrhoids: Secondary | ICD-10-CM

## 2019-06-23 NOTE — Telephone Encounter (Signed)
Rx is not on patient's active med list. Will forward to Dr. Margarita Rana.

## 2019-06-26 MED FILL — HYDROCORT-PRAMOXINE 2.5-1%: 2.5-1 | 10 days supply | Qty: 30 | Fill #0

## 2019-06-27 ENCOUNTER — Encounter: Payer: Self-pay | Admitting: Physical Therapy

## 2019-06-27 ENCOUNTER — Telehealth: Payer: Self-pay | Admitting: *Deleted

## 2019-06-27 ENCOUNTER — Ambulatory Visit: Payer: Self-pay | Admitting: Physical Therapy

## 2019-06-27 ENCOUNTER — Ambulatory Visit: Payer: No Typology Code available for payment source | Admitting: Sports Medicine

## 2019-06-27 ENCOUNTER — Encounter: Payer: Self-pay | Admitting: Sports Medicine

## 2019-06-27 ENCOUNTER — Other Ambulatory Visit: Payer: Self-pay

## 2019-06-27 DIAGNOSIS — M79671 Pain in right foot: Secondary | ICD-10-CM

## 2019-06-27 DIAGNOSIS — M722 Plantar fascial fibromatosis: Secondary | ICD-10-CM

## 2019-06-27 DIAGNOSIS — R293 Abnormal posture: Secondary | ICD-10-CM

## 2019-06-27 DIAGNOSIS — M7750 Other enthesopathy of unspecified foot: Secondary | ICD-10-CM

## 2019-06-27 DIAGNOSIS — G8929 Other chronic pain: Secondary | ICD-10-CM

## 2019-06-27 DIAGNOSIS — M25571 Pain in right ankle and joints of right foot: Secondary | ICD-10-CM

## 2019-06-27 DIAGNOSIS — R252 Cramp and spasm: Secondary | ICD-10-CM

## 2019-06-27 MED ORDER — TRIAMCINOLONE ACETONIDE 10 MG/ML IJ SUSP: 10.0000 mg | Freq: Once | INTRAMUSCULAR | Status: DC

## 2019-06-27 NOTE — Progress Notes (Signed)
Subjective: Aaron Dennis is a 64 y.o. male patient who presents to office for follow-up evaluation of right foot and ankle pain. Patient reports that he still has pain nothing has helped with the pain today pain is still 8 out of 10 worse at the bottom of his heel as well as the side of the foot reports that the medicine by mouth of prednisone did not help has been wearing surgical shoe which helps a little bit when he is driving has to take it off and feels sore sometimes at the end of the day.  Patient reports that he has been taking Tylenol medication but has not noticed any relief still has pain.  Patient denies any other pedal complaints at this time.  Denies any new injury.  Patient Active Problem List   Diagnosis Date Noted  . Primary osteoarthritis of left knee 01/13/2019  . Primary osteoarthritis of right knee 01/13/2019  . Flat foot 11/28/2017  . Tendonitis of foot 11/28/2017  . Polycythemia 05/12/2017  . Vitamin D deficiency 04/21/2017  . Bony sclerosis 08/05/2016  . Lung nodule   . Chronic pain of both knees 12/05/2015  . Gastroesophageal reflux disease without esophagitis 05/17/2014  . Essential hypertension 05/17/2014  . Coronary artery calcification 08/17/2012  . Chronic abdominal pain 06/24/2012  . Right-sided chest wall pain 06/24/2012  . Constipation, chronic 06/24/2012  . Hyperbilirubinemia 06/24/2012  . Chronic back pain 06/24/2012  . DDD (degenerative disc disease), thoracolumbar 06/24/2012  . History of nephrolithiasis 06/24/2012  . Side pain 06/24/2012  . Low back pain at multiple sites 06/24/2012    Current Outpatient Medications on File Prior to Visit  Medication Sig Dispense Refill  . amLODipine (NORVASC) 5 MG tablet Take 1 tablet (5 mg total) by mouth daily. 90 tablet 1  . hydrocortisone-pramoxine (ANALPRAM-HC) 2.5-1 % rectal cream Place 1 application rectally 3 (three) times daily as needed. 30 g 3  . meloxicam (MOBIC) 7.5 MG tablet Take 1 tablet (7.5 mg  total) by mouth daily. 30 tablet 3  . methocarbamol (ROBAXIN-750) 750 MG tablet Take 1 tablet (750 mg total) by mouth 2 (two) times daily as needed for muscle spasms. 180 tablet 1  . omeprazole (PRILOSEC) 40 MG capsule Take 1 capsule (40 mg total) by mouth daily. 90 capsule 1  . predniSONE (STERAPRED UNI-PAK 21 TAB) 10 MG (21) TBPK tablet Take as directed 21 tablet 0   Current Facility-Administered Medications on File Prior to Visit  Medication Dose Route Frequency Provider Last Rate Last Admin  . 0.9 %  sodium chloride infusion  500 mL Intravenous Once Jackquline Denmark, MD        No Known Allergies  Objective:  General: Alert and oriented x3 in no acute distress  Dermatology: No open lesions bilateral lower extremities, no webspace macerations, no ecchymosis bilateral, all nails x 10 are well manicured.  Vascular: Dorsalis Pedis and Posterior Tibial pedal pulses palpable, Capillary Fill Time 3 seconds,(+) pedal hair growth bilateral, no edema bilateral lower extremities, Temperature gradient within normal limits.  Neurology: Johney Maine sensation intact via light touch bilateral.  Musculoskeletal: Mild tenderness with palpation at right medial and lateral ankle gutters and plantar and medial heel at the plantar fascia insertion at peroneal tendon course on the right with most pain today noted at the plantar fascial insertion medial aspect at the peroneal tendon course at the peroneus brevis insertion on the right foot at the fifth metatarsal base, No pain with calf compression bilateral. + pes planus  foot type bilateral.   Assessment and Plan: Problem List Items Addressed This Visit      Musculoskeletal and Integument   Tendonitis of foot    Other Visit Diagnoses    Plantar fasciitis of right foot    -  Primary   Relevant Medications   triamcinolone acetonide (KENALOG) 10 MG/ML injection 10 mg (Start on 06/27/2019 12:45 PM)   Acute right ankle pain       Right foot pain            -Complete examination performed -Previous xrays reviewed -Re-Discussed treatment options -After oral consent and aseptic prep, injected a mixture containing 1 ml of 2%  plain lidocaine, 1 ml 0.5% plain marcaine, 0.5 ml of kenalog 10 and 0.5 ml of dexamethasone phosphate into right heel at plantar fascial insertion without complication. Post-injection care discussed with patient.  This is the second injection to this area. -Dispensed ankle gauntlet for patient to use to provide support to the plantar fascia and tendon at the lateral area -Recommend daily stretching to tolerance and avoid overstretching that could aggravate the peroneal tendon like before -Order MRI for further evaluation to rule out any partial tearing -Recommend continue with OTC tylenol arthritis for any residual pain like before -Return to office after MRI for follow-up evaluation. Landis Martins, DPM

## 2019-06-27 NOTE — Telephone Encounter (Signed)
Faxed orders with 100% letter to [facility] - Radiology Main Scheduling.

## 2019-06-27 NOTE — Therapy (Signed)
[place], [place], [address] Phone: [phone]   Fax:  [phone]  Physical Therapy Treatment  Patient Details  Name: Aaron Dennis MRN: [id number] Date of Birth: February 10, 1956 No data recorded  Encounter Date: 06/27/2019  PT End of Session - 06/27/19 1418    Visit Number  7    Date for PT Re-Evaluation  06/20/19    PT Start Time  1345    PT Stop Time  1420    PT Time Calculation (min)  35 min    Activity Tolerance  Patient limited by fatigue    Behavior During Therapy  [facility] for tasks assessed/performed       Past Medical History:  Diagnosis Date  . GERD (gastroesophageal reflux disease)   . Hypertension   . Lung nodule    7 mm LUL nodule    History reviewed. No pertinent surgical history.  There were no vitals filed for this visit.  Subjective Assessment - 06/27/19 1349    Subjective  Pt reports that his back is getting better. HE stated that's he received a injection in his R foot today and is causing some pain    How long can you sit comfortably?  usually as long as he wants, as long he is in a neutral position    Currently in Pain?  Yes    Pain Score  6    R foot 10/10   Pain Location  Back                        [facility] Adult PT Treatment/Exercise - 06/27/19 0001      Lumbar Exercises: Aerobic   Nustep  L6 x 7 min      Lumbar Exercises: Machines for Strengthening   Cybex Lumbar Extension  x10 with black TB; cues for form    Other Lumbar Machine Exercise  Rows & lats 20lb 2x10       Lumbar Exercises: Standing   Other Standing Lumbar Exercises  Shoulder ext 10# 2x10      Knee/Hip Exercises: Machines for Strengthening   Cybex Leg Press  40# 2x10               PT Short Term Goals - 06/05/19 0941      PT SHORT TERM GOAL #2   Time  3        PT Long Term Goals - 05/23/19 1300      PT LONG TERM GOAL #1   Title  Pt will demonstrate pain free  lumbar AROM WFL.    Baseline  pain with all lumbar motions except R lateral flexion    Time  4    Period  Weeks    Status  New    Target Date  06/20/19      PT LONG TERM GOAL #2   Title  Pt will decrease pain to <4/10 at rest.    Baseline  7-8/10    Time  4    Period  Weeks    Status  New    Target Date  06/20/19      PT LONG TERM GOAL #3   Title  Pt will be compliant with and understand progressions for advanced HEP for long term maintenance.    Time  4    Period  Weeks    Status  New    Target Date  06/20/19  Plan - 06/27/19 1419    Clinical Impression Statement  Shorten treatment session due to increase fatigue and foot pain. Tactile cues for posture needed with seated rows and standing shoulder extension. Cues needed to complete the full ROM on leg press.    Personal Factors and Comorbidities  Comorbidity 1;Sex;Education;Age;Social Background;Time since onset of injury/illness/exacerbation;Profession;Past/Current Experience;Fitness    Comorbidities  HTN    Examination-Activity Limitations  Sit;Sleep;Lift;Bend;Locomotion Level;Stand    Stability/Clinical Decision Making  Stable/Uncomplicated    Rehab Potential  Good    PT Frequency  2x / week    PT Duration  4 weeks    PT Treatment/Interventions  Spinal Manipulations;Joint Manipulations;Cryotherapy;Moist Heat;Iontophoresis 4mg /ml Dexamethasone;Therapeutic activities;Functional mobility training;Neuromuscular re-education;Balance training;Therapeutic exercise;Dry needling;Passive range of motion;Patient/family education;Manual techniques;Taping;Vasopneumatic Device    PT Next Visit Plan  progress into further extension if appropriate. Lumbar stabilization, ROM, and LE strengthening/flexibility       Patient will benefit from skilled therapeutic intervention in order to improve the following deficits and impairments:  Decreased activity tolerance, Hypomobility, Decreased mobility, Decreased endurance, Decreased  range of motion, Impaired perceived functional ability, Improper body mechanics, Postural dysfunction, Pain, Impaired flexibility, Increased fascial restricitons  Visit Diagnosis: Abnormal posture  Chronic bilateral low back pain with right-sided sciatica  Cramp and spasm     Problem List Patient Active Problem List   Diagnosis Date Noted  . Primary osteoarthritis of left knee 01/13/2019  . Primary osteoarthritis of right knee 01/13/2019  . Flat foot 11/28/2017  . Tendonitis of foot 11/28/2017  . Polycythemia 05/12/2017  . Vitamin D deficiency 04/21/2017  . Bony sclerosis 08/05/2016  . Lung nodule   . Chronic pain of both knees 12/05/2015  . Gastroesophageal reflux disease without esophagitis 05/17/2014  . Essential hypertension 05/17/2014  . Coronary artery calcification 08/17/2012  . Chronic abdominal pain 06/24/2012  . Right-sided chest wall pain 06/24/2012  . Constipation, chronic 06/24/2012  . Hyperbilirubinemia 06/24/2012  . Chronic back pain 06/24/2012  . DDD (degenerative disc disease), thoracolumbar 06/24/2012  . History of nephrolithiasis 06/24/2012  . Side pain 06/24/2012  . Low back pain at multiple sites 06/24/2012    Scot Jun 06/27/2019, 2:21 PM  [place], [place], [address] Phone: [phone]   Fax:  [phone]  Name: KALOBE BATMAN MRN: [id number] Date of Birth: [birth year]

## 2019-06-27 NOTE — Telephone Encounter (Signed)
-----   Message from Landis Martins, [place] sent at 06/27/2019 12:41 PM EDT ----- Regarding: MRI right ankle Chronic pain not relieved with conservative care concern for partial tendon tear of the peroneal tendon as it inserts on the fifth metatarsal base as well as chronic heel pain fasciitis

## 2019-06-29 ENCOUNTER — Other Ambulatory Visit: Payer: Self-pay

## 2019-06-29 ENCOUNTER — Encounter: Payer: Self-pay | Admitting: Physical Therapy

## 2019-06-29 ENCOUNTER — Ambulatory Visit: Payer: Self-pay | Admitting: Physical Therapy

## 2019-06-29 DIAGNOSIS — G8929 Other chronic pain: Secondary | ICD-10-CM

## 2019-06-29 DIAGNOSIS — M5441 Lumbago with sciatica, right side: Secondary | ICD-10-CM

## 2019-06-29 DIAGNOSIS — R293 Abnormal posture: Secondary | ICD-10-CM

## 2019-06-29 DIAGNOSIS — R252 Cramp and spasm: Secondary | ICD-10-CM

## 2019-06-29 NOTE — Therapy (Signed)
[place], [place], [address] Phone: [phone]   Fax:  [phone]  Physical Therapy Treatment  Patient Details  Name: Aaron Dennis MRN: [id number] Date of Birth: [birth year] No data recorded  Encounter Date: 06/29/2019  PT End of Session - 06/29/19 [id number]    Visit Number  8    Date for PT Re-Evaluation  08/29/19    PT Start Time  0845    PT Stop Time  0935    PT Time Calculation (min)  50 min    Activity Tolerance  Patient limited by fatigue    Behavior During Therapy  [facility] for tasks assessed/performed       Past Medical History:  Diagnosis Date  . GERD (gastroesophageal reflux disease)   . Hypertension   . Lung nodule    7 mm LUL nodule    History reviewed. No pertinent surgical history.  There were no vitals filed for this visit.  Subjective Assessment - 06/29/19 0853    Subjective  Pt reports back is getting better; states that next week will be his last visits because he is going out of the country to take care of his mother.    Currently in Pain?  Yes    Pain Score  6     Pain Location  Back                        [facility] Adult PT Treatment/Exercise - 06/29/19 0001      Lumbar Exercises: Aerobic   Recumbent Bike  6 min      Lumbar Exercises: Machines for Strengthening   Cybex Lumbar Extension  x10 with black TB    Other Lumbar Machine Exercise  Rows & lats 25lb 2x10       Lumbar Exercises: Standing   Heel Raises Limitations  2x15    Other Standing Lumbar Exercises  Shoulder ext 10# 2x10    Other Standing Lumbar Exercises  Resisted side step/forward step 40lb x 3 each      Modalities   Modalities  Electrical Stimulation;Moist Heat      Moist Heat Therapy   Number Minutes Moist Heat  15 Minutes    Moist Heat Location  Lumbar Spine      Electrical Stimulation   Electrical Stimulation Location  Thoracic    Electrical Stimulation Action  IFC    Electrical Stimulation Parameters  supine    Electrical Stimulation Goals  Pain               PT Short Term Goals - 06/29/19 [id number]      PT SHORT TERM GOAL #1   Title  Pt will be I and compliant with initial HEP.    Status  Achieved      PT SHORT TERM GOAL #2   Title  Pt will increase HS length to <30 from neutral via popliteal angle.    Baseline  -50 R. -40 L    Time  3    Period  Weeks    Status  On-going      PT SHORT TERM GOAL #3   Title  Pt will report no radicular symptoms down RLE, indicating centralization of LBP.    Status  Achieved        PT Long Term Goals - 06/29/19 [id number]      PT LONG TERM GOAL #1   Title  Pt will demonstrate  pain free lumbar AROM WFL.    Status  On-going      PT LONG TERM GOAL #2   Title  Pt will decrease pain to <4/10 at rest.    Status  On-going      PT LONG TERM GOAL #3   Title  Pt will be compliant with and understand progressions for advanced HEP for long term maintenance.    Status  On-going            Plan - 06/29/19 [id number]    Clinical Impression Statement  Pt making progress in lumbar/LE strength/ROM; still limited by fatigue and foot pain. Pt responded well to heat and estim today. Stabilization needed for resisted gait sidestepping and cues for form. Continue to progress next rx.    Personal Factors and Comorbidities  Comorbidity 1;Sex;Education;Age;Social Background;Time since onset of injury/illness/exacerbation;Profession;Past/Current Experience;Fitness    Comorbidities  HTN    Examination-Activity Limitations  Sit;Sleep;Lift;Bend;Locomotion Level;Stand    Examination-Participation Restrictions  Driving;Community Activity    Rehab Potential  Good    PT Frequency  2x / week    PT Duration  4 weeks    PT Treatment/Interventions  Spinal Manipulations;Joint Manipulations;Cryotherapy;Moist Heat;Iontophoresis 4mg /ml Dexamethasone;Therapeutic activities;Functional mobility training;Neuromuscular re-education;Balance  training;Therapeutic exercise;Dry needling;Passive range of motion;Patient/family education;Manual techniques;Taping;Vasopneumatic Device    PT Next Visit Plan  progress into further extension if appropriate. Lumbar stabilization, ROM, and LE strengthening/flexibility    PT Home Exercise Plan  [id number]: standing extensions, supine/seated figure 4 stretching, hamstring stretch    Consulted and Agree with Plan of Care  Patient       Patient will benefit from skilled therapeutic intervention in order to improve the following deficits and impairments:  Decreased activity tolerance, Hypomobility, Decreased mobility, Decreased endurance, Decreased range of motion, Impaired perceived functional ability, Improper body mechanics, Postural dysfunction, Pain, Impaired flexibility, Increased fascial restricitons  Visit Diagnosis: Abnormal posture  Chronic bilateral low back pain with right-sided sciatica  Cramp and spasm     Problem List Patient Active Problem List   Diagnosis Date Noted  . Primary osteoarthritis of left knee 01/13/2019  . Primary osteoarthritis of right knee 01/13/2019  . Flat foot 11/28/2017  . Tendonitis of foot 11/28/2017  . Polycythemia 05/12/2017  . Vitamin D deficiency 04/21/2017  . Bony sclerosis 08/05/2016  . Lung nodule   . Chronic pain of both knees 12/05/2015  . Gastroesophageal reflux disease without esophagitis 05/17/2014  . Essential hypertension 05/17/2014  . Coronary artery calcification 08/17/2012  . Chronic abdominal pain 06/24/2012  . Right-sided chest wall pain 06/24/2012  . Constipation, chronic 06/24/2012  . Hyperbilirubinemia 06/24/2012  . Chronic back pain 06/24/2012  . DDD (degenerative disc disease), thoracolumbar 06/24/2012  . History of nephrolithiasis 06/24/2012  . Side pain 06/24/2012  . Low back pain at multiple sites 06/24/2012   Amador Cunas, PT, DPT Donald Prose Allani Reber 06/29/2019, 9:26 AM  [place], [place], [address] Phone: [phone]   Fax:  [phone]  Name: Aaron Dennis MRN: [id number] Date of Birth: Sep 04, 1955

## 2019-06-29 NOTE — Addendum Note (Signed)
Addended by: Dawayne Patricia on: 06/29/2019 11:42 AM   Modules accepted: Orders

## 2019-06-30 ENCOUNTER — Ambulatory Visit (HOSPITAL_COMMUNITY)
Admission: RE | Admit: 2019-06-30 | Discharge: 2019-06-30 | Disposition: A | Discharge status: Home / Self Care | Payer: Self-pay | Source: Ambulatory Visit | Attending: Sports Medicine | Admitting: Sports Medicine

## 2019-06-30 DIAGNOSIS — M722 Plantar fascial fibromatosis: Secondary | ICD-10-CM | POA: Insufficient documentation

## 2019-06-30 DIAGNOSIS — M25571 Pain in right ankle and joints of right foot: Secondary | ICD-10-CM | POA: Insufficient documentation

## 2019-06-30 DIAGNOSIS — M7750 Other enthesopathy of unspecified foot: Secondary | ICD-10-CM | POA: Insufficient documentation

## 2019-07-04 ENCOUNTER — Ambulatory Visit: Payer: Self-pay | Admitting: Physical Therapy

## 2019-07-04 ENCOUNTER — Other Ambulatory Visit: Payer: Self-pay

## 2019-07-04 ENCOUNTER — Encounter: Payer: Self-pay | Admitting: Physical Therapy

## 2019-07-04 DIAGNOSIS — R293 Abnormal posture: Secondary | ICD-10-CM

## 2019-07-04 DIAGNOSIS — G8929 Other chronic pain: Secondary | ICD-10-CM

## 2019-07-04 DIAGNOSIS — R252 Cramp and spasm: Secondary | ICD-10-CM

## 2019-07-04 DIAGNOSIS — M545 Low back pain, unspecified: Secondary | ICD-10-CM

## 2019-07-04 NOTE — Therapy (Signed)
[place], [place], [address] Phone: [phone]   Fax:  [phone]  Physical Therapy Treatment  Patient Details  Name: Aaron Dennis MRN: [id number] Date of Birth: 09-01-1955 No data recorded  Encounter Date: 07/04/2019  PT End of Session - 07/04/19 1003    Visit Number  9    Date for PT Re-Evaluation  08/29/19    PT Start Time  0930    PT Stop Time  1018    PT Time Calculation (min)  48 min    Activity Tolerance  Patient tolerated treatment well    Behavior During Therapy  [facility] for tasks assessed/performed       Past Medical History:  Diagnosis Date  . GERD (gastroesophageal reflux disease)   . Hypertension   . Lung nodule    7 mm LUL nodule    History reviewed. No pertinent surgical history.  There were no vitals filed for this visit.  Subjective Assessment - 07/04/19 0933    Subjective  "Getting old" Back is not that bad but some pain on R flank    Currently in Pain?  Yes    Pain Score  5     Pain Location  Back    Pain Orientation  Right                        [facility] Adult PT Treatment/Exercise - 07/04/19 0001      Lumbar Exercises: Aerobic   UBE (Upper Arm Bike)  L5 x 2 min each way    Nustep  L6 x 4 min      Lumbar Exercises: Machines for Strengthening   Cybex Lumbar Extension  2x10 with black TB    Other Lumbar Machine Exercise  Rows & lats 25lb 2x10       Lumbar Exercises: Standing   Other Standing Lumbar Exercises  Shoulder ext 10# 2x10    Other Standing Lumbar Exercises  Resisted side step/forward step 40lb x 3 each      Modalities   Modalities  Electrical Stimulation;Moist Heat      Moist Heat Therapy   Number Minutes Moist Heat  15 Minutes    Moist Heat Location  Lumbar Spine      Electrical Stimulation   Electrical Stimulation Location  Thoracic    Electrical Stimulation Action  IFC    Electrical Stimulation Parameters  supine    Electrical Stimulation Goals  Pain               PT Short Term Goals - 06/29/19 [id number]      PT SHORT TERM GOAL #1   Title  Pt will be I and compliant with initial HEP.    Status  Achieved      PT SHORT TERM GOAL #2   Title  Pt will increase HS length to <30 from neutral via popliteal angle.    Baseline  -50 R. -40 L    Time  3    Period  Weeks    Status  On-going      PT SHORT TERM GOAL #3   Title  Pt will report no radicular symptoms down RLE, indicating centralization of LBP.    Status  Achieved        PT Long Term Goals - 06/29/19 [id number]      PT LONG TERM GOAL #1   Title  Pt will demonstrate pain free  lumbar AROM WFL.    Status  On-going      PT LONG TERM GOAL #2   Title  Pt will decrease pain to <4/10 at rest.    Status  On-going      PT LONG TERM GOAL #3   Title  Pt will be compliant with and understand progressions for advanced HEP for long term maintenance.    Status  On-going            Plan - 07/04/19 1004    Clinical Impression Statement  Postural cues given throughout session. Pt appears to give minium effort with interventions. Some instability noted with resisted side steps, cues needed with side step to stay in the middle, pt tends to drift forward when stepping. Estim for pain.    Personal Factors and Comorbidities  Comorbidity 1;Sex;Education;Age;Social Background;Time since onset of injury/illness/exacerbation;Profession;Past/Current Experience;Fitness    Examination-Activity Limitations  Sit;Sleep;Lift;Bend;Locomotion Level;Stand    Examination-Participation Restrictions  Driving;Community Activity    Stability/Clinical Decision Making  Stable/Uncomplicated    Rehab Potential  Good    PT Frequency  2x / week    PT Treatment/Interventions  Spinal Manipulations;Joint Manipulations;Cryotherapy;Moist Heat;Iontophoresis 4mg /ml Dexamethasone;Therapeutic activities;Functional mobility training;Neuromuscular re-education;Balance  training;Therapeutic exercise;Dry needling;Passive range of motion;Patient/family education;Manual techniques;Taping;Vasopneumatic Device    PT Next Visit Plan  progress into further extension if appropriate. Lumbar stabilization, ROM, and LE strengthening/flexibility       Patient will benefit from skilled therapeutic intervention in order to improve the following deficits and impairments:  Decreased activity tolerance, Hypomobility, Decreased mobility, Decreased endurance, Decreased range of motion, Impaired perceived functional ability, Improper body mechanics, Postural dysfunction, Pain, Impaired flexibility, Increased fascial restricitons  Visit Diagnosis: Chronic bilateral low back pain with right-sided sciatica  Cramp and spasm  Chronic bilateral low back pain without sciatica  Abnormal posture     Problem List Patient Active Problem List   Diagnosis Date Noted  . Primary osteoarthritis of left knee 01/13/2019  . Primary osteoarthritis of right knee 01/13/2019  . Flat foot 11/28/2017  . Tendonitis of foot 11/28/2017  . Polycythemia 05/12/2017  . Vitamin D deficiency 04/21/2017  . Bony sclerosis 08/05/2016  . Lung nodule   . Chronic pain of both knees 12/05/2015  . Gastroesophageal reflux disease without esophagitis 05/17/2014  . Essential hypertension 05/17/2014  . Coronary artery calcification 08/17/2012  . Chronic abdominal pain 06/24/2012  . Right-sided chest wall pain 06/24/2012  . Constipation, chronic 06/24/2012  . Hyperbilirubinemia 06/24/2012  . Chronic back pain 06/24/2012  . DDD (degenerative disc disease), thoracolumbar 06/24/2012  . History of nephrolithiasis 06/24/2012  . Side pain 06/24/2012  . Low back pain at multiple sites 06/24/2012    Scot Jun, PTA 07/04/2019, 10:06 AM  [place], [place], [address] Phone: [phone]   Fax:  [phone]  Name:  Aaron Dennis MRN: [id number] Date of Birth: [birth year]

## 2019-07-06 ENCOUNTER — Ambulatory Visit: Payer: Self-pay | Admitting: Physical Therapy

## 2019-07-07 ENCOUNTER — Other Ambulatory Visit: Payer: Self-pay

## 2019-07-07 ENCOUNTER — Telehealth: Payer: Self-pay

## 2019-07-07 DIAGNOSIS — G8929 Other chronic pain: Secondary | ICD-10-CM

## 2019-07-07 DIAGNOSIS — M5135 Other intervertebral disc degeneration, thoracolumbar region: Secondary | ICD-10-CM

## 2019-07-07 DIAGNOSIS — I10 Essential (primary) hypertension: Secondary | ICD-10-CM

## 2019-07-07 DIAGNOSIS — K219 Gastro-esophageal reflux disease without esophagitis: Secondary | ICD-10-CM

## 2019-07-07 DIAGNOSIS — M25562 Pain in left knee: Secondary | ICD-10-CM

## 2019-07-07 MED ORDER — MELOXICAM 7.5 MG PO TABS: 7.5000 mg | ORAL_TABLET | Freq: Every day | ORAL | 0 refills | Status: DC

## 2019-07-07 MED ORDER — OMEPRAZOLE 40 MG PO CPDR: 40.0000 mg | DELAYED_RELEASE_CAPSULE | Freq: Every day | ORAL | 0 refills | Status: DC

## 2019-07-07 MED ORDER — AMLODIPINE BESYLATE 5 MG PO TABS: 5.0000 mg | ORAL_TABLET | Freq: Every day | ORAL | 0 refills | Status: DC

## 2019-07-07 NOTE — Telephone Encounter (Signed)
1) Medication(s) Requested (by name): omeprazole, meloxicam, amlodipine  2) Pharmacy of Choice: [facility]   3) Special Requests:  PT [place] pharmacy said to get physician to approve   Approved medications will be sent to the pharmacy, we will reach out if there is an issue.  Requests made after 3pm may not be addressed until the following business day!  If a patient is unsure of the name of the medication(s) please note and ask patient to call back when they are able to provide all info, do not send to responsible party until all information is available!

## 2019-07-07 NOTE — Telephone Encounter (Signed)
Prescriptions were sent earlier by Alycia.

## 2019-07-07 NOTE — Telephone Encounter (Signed)
Please fill if appropriate.  

## 2019-07-11 ENCOUNTER — Ambulatory Visit: Payer: Self-pay | Admitting: Physician Assistant

## 2019-07-11 ENCOUNTER — Other Ambulatory Visit: Payer: Self-pay

## 2019-07-11 DIAGNOSIS — G8929 Other chronic pain: Secondary | ICD-10-CM

## 2019-07-11 DIAGNOSIS — K219 Gastro-esophageal reflux disease without esophagitis: Secondary | ICD-10-CM

## 2019-07-11 DIAGNOSIS — M25562 Pain in left knee: Secondary | ICD-10-CM

## 2019-07-11 DIAGNOSIS — M25561 Pain in right knee: Secondary | ICD-10-CM

## 2019-07-11 DIAGNOSIS — M5135 Other intervertebral disc degeneration, thoracolumbar region: Secondary | ICD-10-CM

## 2019-07-11 DIAGNOSIS — I10 Essential (primary) hypertension: Secondary | ICD-10-CM

## 2019-07-11 DIAGNOSIS — K648 Other hemorrhoids: Secondary | ICD-10-CM

## 2019-07-11 MED ORDER — HYDROCORT-PRAMOXINE (PERIANAL) 2.5-1 % EX CREA: 1.0000 "application " | TOPICAL_CREAM | Freq: Three times a day (TID) | CUTANEOUS | 3 refills | Status: DC | PRN

## 2019-07-11 MED ORDER — MELOXICAM 7.5 MG PO TABS: 7.5000 mg | ORAL_TABLET | Freq: Every day | ORAL | 0 refills | Status: DC

## 2019-07-11 MED ORDER — METHOCARBAMOL 750 MG PO TABS: 750.0000 mg | ORAL_TABLET | Freq: Two times a day (BID) | ORAL | 1 refills | Status: DC | PRN

## 2019-07-11 MED ORDER — OMEPRAZOLE 40 MG PO CPDR: 40.0000 mg | DELAYED_RELEASE_CAPSULE | Freq: Every day | ORAL | 0 refills | Status: DC

## 2019-07-11 MED ORDER — AMLODIPINE BESYLATE 5 MG PO TABS: 5.0000 mg | ORAL_TABLET | Freq: Every day | ORAL | 0 refills | Status: DC

## 2019-07-11 MED FILL — OMEPRAZOLE DR 40 MG CAPSULE: 40 | 30 days supply | Qty: 30 | Fill #3

## 2019-07-11 MED FILL — HYDROCORT-PRAMOXINE 2.5-1%: 2.5-1 | 10 days supply | Qty: 30 | Fill #0

## 2019-07-11 MED FILL — METHOCARBAMOL 750 MG TABS: 750 | 30 days supply | Qty: 60 | Fill #0

## 2019-07-11 MED FILL — AMLODIPINE BESYLATE 5 MG TA: 5 | 30 days supply | Qty: 30 | Fill #0

## 2019-07-11 MED FILL — MELOXICAM 7.5 MG TABLET: 7.5 | 30 days supply | Qty: 30 | Fill #0

## 2019-07-11 MED FILL — ?AMLODIPINE BESYLATE 5MG TA: 5 | 30 days supply | Qty: 30 | Fill #0

## 2019-07-11 MED FILL — OMEPRAZOLE DR 40 MG CAPSULE: 40 | 30 days supply | Qty: 30 | Fill #0

## 2019-07-11 MED FILL — AMLODIPINE BESYLATE 5 MG TA: 5 | 30 days supply | Qty: 30 | Fill #4

## 2019-07-11 NOTE — Progress Notes (Signed)
Established Patient Office Visit  Subjective:  Patient ID: Aaron Dennis, male    DOB: 1955/04/18  Age: 64 y.o. MRN: [id number]  CC:  Chief Complaint  Patient presents with  . Medication Refill    HPI Aaron Dennis reports that he will be traveling out of the country tomorrow to visit his mother who is having surgery.  Reports that he is worried that he will run out of his blood pressure medication prior to returning.  Reports that he is doing well, no concerns at this time.  Past Medical History:  Diagnosis Date  . GERD (gastroesophageal reflux disease)   . Hypertension   . Lung nodule    7 mm LUL nodule    History reviewed. No pertinent surgical history.  Family History  Problem Relation Age of Onset  . Healthy Mother   . Diabetes Brother   . Colon polyps Neg Hx   . Colon cancer Neg Hx   . Esophageal cancer Neg Hx   . Rectal cancer Neg Hx   . Stomach cancer Neg Hx     Social History   Socioeconomic History  . Marital status: Married    Spouse name: Not on file  . Number of children: Not on file  . Years of education: Not on file  . Highest education level: Not on file  Occupational History  . Not on file  Tobacco Use  . Smoking status: Former Research scientist (life sciences)  . Smokeless tobacco: Never Used  . Tobacco comment: quit Jan 2018  Substance and Sexual Activity  . Alcohol use: No  . Drug use: No  . Sexual activity: Not on file  Other Topics Concern  . Not on file  Social History Narrative  . Not on file   Social Determinants of Health   Financial Resource Strain:   . Difficulty of Paying Living Expenses:   Food Insecurity:   . Worried About Charity fundraiser in the Last Year:   . Arboriculturist in the Last Year:   Transportation Needs:   . Film/video editor (Medical):   Marland Kitchen Lack of Transportation (Non-Medical):   Physical Activity:   . Days of Exercise per Week:   . Minutes of Exercise per Session:   Stress:   . Feeling of Stress :   Social  Connections:   . Frequency of Communication with Friends and Family:   . Frequency of Social Gatherings with Friends and Family:   . Attends Religious Services:   . Active Member of Clubs or Organizations:   . Attends Archivist Meetings:   Marland Kitchen Marital Status:   Intimate Partner Violence:   . Fear of Current or Ex-Partner:   . Emotionally Abused:   Marland Kitchen Physically Abused:   . Sexually Abused:     Outpatient Medications Prior to Visit  Medication Sig Dispense Refill  . amLODipine (NORVASC) 5 MG tablet Take 1 tablet (5 mg total) by mouth daily. 30 tablet 0  . hydrocortisone-pramoxine (ANALPRAM-HC) 2.5-1 % rectal cream Place 1 application rectally 3 (three) times daily as needed. 30 g 3  . meloxicam (MOBIC) 7.5 MG tablet Take 1 tablet (7.5 mg total) by mouth daily. 30 tablet 0  . methocarbamol (ROBAXIN-750) 750 MG tablet Take 1 tablet (750 mg total) by mouth 2 (two) times daily as needed for muscle spasms. 180 tablet 1  . omeprazole (PRILOSEC) 40 MG capsule Take 1 capsule (40 mg total) by mouth daily. 30 capsule 0  .  predniSONE (STERAPRED UNI-PAK 21 TAB) 10 MG (21) TBPK tablet Take as directed 21 tablet 0   Facility-Administered Medications Prior to Visit  Medication Dose Route Frequency Provider Last Rate Last Admin  . 0.9 %  sodium chloride infusion  500 mL Intravenous Once Jackquline Denmark, MD      . triamcinolone acetonide (KENALOG) 10 MG/ML injection 10 mg  10 mg Other Once Landis Martins, DPM        No Known Allergies  ROS Review of Systems  Constitutional: Negative.   HENT: Negative.   Eyes: Negative.   Respiratory: Negative.   Cardiovascular: Negative.   Gastrointestinal: Negative.   Endocrine: Negative.   Genitourinary: Negative.   Musculoskeletal: Positive for arthralgias and myalgias.  Skin: Negative.   Allergic/Immunologic: Negative.   Neurological: Negative.   Hematological: Negative.   Psychiatric/Behavioral: Negative.       Objective:    Physical  Exam  Constitutional: He is oriented to person, place, and time. He appears well-developed and well-nourished. No distress.  HENT:  Head: Normocephalic and atraumatic.  Right Ear: External ear normal.  Left Ear: External ear normal.  Nose: Nose normal.  Mouth/Throat: Oropharynx is clear and moist.  Eyes: Pupils are equal, round, and reactive to light. Conjunctivae and EOM are normal.  Cardiovascular: Normal rate and regular rhythm.  Pulmonary/Chest: Effort normal and breath sounds normal.  Abdominal: Soft. Bowel sounds are normal.  Musculoskeletal:        General: Normal range of motion.     Cervical back: Normal range of motion and neck supple.  Neurological: He is alert and oriented to person, place, and time. He has normal reflexes.  Skin: Skin is warm and dry. He is not diaphoretic.  Psychiatric: He has a normal mood and affect. His behavior is normal. Judgment and thought content normal.  Nursing note and vitals reviewed.   BP 132/87 (BP Location: Left Arm, Patient Position: Sitting, Cuff Size: Normal)   Pulse 74   Temp 98.2 F (36.8 C) (Oral)   Resp 18   Ht 5\' 9"  (1.753 m)   Wt 183 lb (83 kg)   SpO2 100%   BMI 27.02 kg/m  Wt Readings from Last 3 Encounters:  07/11/19 183 lb (83 kg)  01/24/19 188 lb 6.4 oz (85.5 kg)  01/13/19 190 lb (86.2 kg)     Health Maintenance Due  Topic Date Due  . COVID-19 Vaccine (1) Never done  . TETANUS/TDAP  Never done    There are no preventive care reminders to display for this patient.  Lab Results  Component Value Date   TSH 1.810 04/08/2017   Lab Results  Component Value Date   WBC 7.3 09/13/2017   HGB 17.0 09/13/2017   HCT 51.0 (H) 09/13/2017   MCV 83.3 09/13/2017   PLT 193 09/13/2017   Lab Results  Component Value Date   NA 138 11/29/2018   K 3.8 11/29/2018   CO2 22 11/29/2018   GLUCOSE 83 11/29/2018   BUN 18 11/29/2018   CREATININE 0.99 11/29/2018   BILITOT 1.1 11/29/2018   ALKPHOS 173 (H) 11/29/2018   AST 22  11/29/2018   ALT 22 11/29/2018   PROT 6.9 11/29/2018   ALBUMIN 4.6 11/29/2018   CALCIUM 9.1 11/29/2018   ANIONGAP 11 07/12/2017   Lab Results  Component Value Date   CHOL 171 04/08/2017   Lab Results  Component Value Date   HDL 32 (L) 04/08/2017   Lab Results  Component Value Date  [place] 121 (H) 04/08/2017   Lab Results  Component Value Date   TRIG 91 04/08/2017   Lab Results  Component Value Date   CHOLHDL 5.3 (H) 04/08/2017   Lab Results  Component Value Date   HGBA1C 5.4 (A) 11/29/2018      Assessment & Plan:   Problem List Items Addressed This Visit      Cardiovascular and Mediastinum   Essential hypertension   Relevant Medications   amLODipine (NORVASC) 5 MG tablet     Digestive   Gastroesophageal reflux disease without esophagitis   Relevant Medications   omeprazole (PRILOSEC) 40 MG capsule     Musculoskeletal and Integument   DDD (degenerative disc disease), thoracolumbar   Relevant Medications   meloxicam (MOBIC) 7.5 MG tablet   methocarbamol (ROBAXIN-750) 750 MG tablet     Other   Chronic pain of both knees   Relevant Medications   meloxicam (MOBIC) 7.5 MG tablet   methocarbamol (ROBAXIN-750) 750 MG tablet    Other Visit Diagnoses    Other hemorrhoids       Relevant Medications   amLODipine (NORVASC) 5 MG tablet   hydrocortisone-pramoxine (ANALPRAM-HC) 2.5-1 % rectal cream      Meds ordered this encounter  Medications  . amLODipine (NORVASC) 5 MG tablet    Sig: Take 1 tablet (5 mg total) by mouth daily.    Dispense:  30 tablet    Refill:  0    Order Specific Question:   Supervising Provider    Answer:   Joya Gaskins, PATRICK E [[id number]]  . hydrocortisone-pramoxine (ANALPRAM-HC) 2.5-1 % rectal cream    Sig: Place 1 application rectally 3 (three) times daily as needed.    Dispense:  30 g    Refill:  3    Order Specific Question:   Supervising Provider    Answer:   Asencion Noble E [[id number]]  . meloxicam (MOBIC) 7.5 MG tablet    Sig:  Take 1 tablet (7.5 mg total) by mouth daily.    Dispense:  30 tablet    Refill:  0    Order Specific Question:   Supervising Provider    Answer:   Joya Gaskins, PATRICK E [[id number]]  . methocarbamol (ROBAXIN-750) 750 MG tablet    Sig: Take 1 tablet (750 mg total) by mouth 2 (two) times daily as needed for muscle spasms.    Dispense:  180 tablet    Refill:  1    Order Specific Question:   Supervising Provider    Answer:   Joya Gaskins, PATRICK E [[id number]]  . omeprazole (PRILOSEC) 40 MG capsule    Sig: Take 1 capsule (40 mg total) by mouth daily.    Dispense:  30 capsule    Refill:  0    Order Specific Question:   Supervising Provider    Answer:   WRIGHT, PATRICK E [[id number]]  1. Essential hypertension Patient is well-established with his [facility] care[facility], refill given, patient will follow up with primary care provider upon return.  Patient did not want to provide fasting labs today due to time constraints.  Patient did not want to wait for after visit summary. - amLODipine (NORVASC) 5 MG tablet; Take 1 tablet (5 mg total) by mouth daily.  Dispense: 30 tablet; Refill: 0  2. Other hemorrhoids  - hydrocortisone-pramoxine (ANALPRAM-HC) 2.5-1 % rectal cream; Place 1 application rectally 3 (three) times daily as needed.  Dispense: 30 g; Refill: 3  3. DDD (degenerative disc  disease), thoracolumbar  - meloxicam (MOBIC) 7.5 MG tablet; Take 1 tablet (7.5 mg total) by mouth daily.  Dispense: 30 tablet; Refill: 0 - methocarbamol (ROBAXIN-750) 750 MG tablet; Take 1 tablet (750 mg total) by mouth 2 (two) times daily as needed for muscle spasms.  Dispense: 180 tablet; Refill: 1  4. Chronic pain of both knees  - meloxicam (MOBIC) 7.5 MG tablet; Take 1 tablet (7.5 mg total) by mouth daily.  Dispense: 30 tablet; Refill: 0  5. Gastroesophageal reflux disease without esophagitis  - omeprazole (PRILOSEC) 40 MG capsule; Take 1 capsule (40 mg total) by mouth daily.  Dispense: 30  capsule; Refill: 0   I have reviewed the patient's medical history (PMH, PSH, Social History, Family History, Medications, and allergies) , and have been updated if relevant. I spent 20 minutes reviewing chart and  face to face time with patient.     Follow-up: Return if symptoms worsen or fail to improve.    Loraine Grip Mayers, PA-C

## 2019-12-07 ENCOUNTER — Other Ambulatory Visit: Payer: Self-pay | Admitting: Thoracic Surgery (Cardiothoracic Vascular Surgery)

## 2019-12-07 DIAGNOSIS — R911 Solitary pulmonary nodule: Secondary | ICD-10-CM

## 2019-12-28 ENCOUNTER — Other Ambulatory Visit: Payer: Self-pay

## 2020-01-02 ENCOUNTER — Ambulatory Visit: Payer: Self-pay | Admitting: Thoracic Surgery (Cardiothoracic Vascular Surgery)

## 2020-01-15 ENCOUNTER — Other Ambulatory Visit: Payer: Self-pay | Admitting: Family Medicine

## 2020-01-15 DIAGNOSIS — M25562 Pain in left knee: Secondary | ICD-10-CM

## 2020-01-15 DIAGNOSIS — G8929 Other chronic pain: Secondary | ICD-10-CM

## 2020-01-15 DIAGNOSIS — I10 Essential (primary) hypertension: Secondary | ICD-10-CM

## 2020-01-15 DIAGNOSIS — M5135 Other intervertebral disc degeneration, thoracolumbar region: Secondary | ICD-10-CM

## 2020-01-15 MED FILL — OMEPRAZOLE DR 40 MG CAPSULE: 40 | 30 days supply | Qty: 30 | Fill #4

## 2020-01-15 MED FILL — MELOXICAM 7.5 MG TABLET: 7.5 | 30 days supply | Qty: 30 | Fill #0

## 2020-01-15 MED FILL — AMLODIPINE BESYLATE 5 MG TA: 5 | 30 days supply | Qty: 30 | Fill #0

## 2020-01-15 NOTE — Telephone Encounter (Signed)
Attempted to call patient to schedule appointment- left message to call office. Courtesy RF #[phone] given

## 2020-01-24 ENCOUNTER — Other Ambulatory Visit: Payer: Self-pay

## 2020-01-24 ENCOUNTER — Ambulatory Visit: Payer: Self-pay | Attending: Family Medicine

## 2020-02-06 ENCOUNTER — Ambulatory Visit: Payer: Self-pay | Admitting: Thoracic Surgery (Cardiothoracic Vascular Surgery)

## 2020-02-27 ENCOUNTER — Ambulatory Visit: Payer: Self-pay | Admitting: Thoracic Surgery (Cardiothoracic Vascular Surgery)

## 2020-03-12 ENCOUNTER — Ambulatory Visit: Payer: Self-pay | Admitting: Thoracic Surgery (Cardiothoracic Vascular Surgery)

## 2020-03-18 ENCOUNTER — Ambulatory Visit: Payer: Self-pay | Admitting: Family Medicine

## 2020-04-12 ENCOUNTER — Ambulatory Visit (HOSPITAL_COMMUNITY)
Admission: EM | Admit: 2020-04-12 | Discharge: 2020-04-12 | Disposition: A | Discharge status: Home / Self Care | Payer: Self-pay | Attending: Internal Medicine | Admitting: Internal Medicine

## 2020-04-12 ENCOUNTER — Encounter (HOSPITAL_COMMUNITY): Payer: Self-pay

## 2020-04-12 ENCOUNTER — Other Ambulatory Visit: Payer: Self-pay | Admitting: Internal Medicine

## 2020-04-12 ENCOUNTER — Other Ambulatory Visit: Payer: Self-pay

## 2020-04-12 DIAGNOSIS — G8929 Other chronic pain: Secondary | ICD-10-CM

## 2020-04-12 DIAGNOSIS — M25562 Pain in left knee: Secondary | ICD-10-CM

## 2020-04-12 DIAGNOSIS — K219 Gastro-esophageal reflux disease without esophagitis: Secondary | ICD-10-CM

## 2020-04-12 DIAGNOSIS — M5135 Other intervertebral disc degeneration, thoracolumbar region: Secondary | ICD-10-CM

## 2020-04-12 DIAGNOSIS — Z760 Encounter for issue of repeat prescription: Secondary | ICD-10-CM

## 2020-04-12 DIAGNOSIS — I10 Essential (primary) hypertension: Secondary | ICD-10-CM

## 2020-04-12 MED ORDER — OMEPRAZOLE 40 MG PO CPDR: 40.0000 mg | DELAYED_RELEASE_CAPSULE | Freq: Every day | ORAL | 0 refills | Status: DC

## 2020-04-12 MED ORDER — AMLODIPINE BESYLATE 5 MG PO TABS: 5.0000 mg | ORAL_TABLET | Freq: Every day | ORAL | 1 refills | Status: DC

## 2020-04-12 MED ORDER — MELOXICAM 7.5 MG PO TABS: 7.5000 mg | ORAL_TABLET | Freq: Every day | ORAL | 0 refills | Status: DC

## 2020-04-12 MED FILL — AMLODIPINE BESYLATE 5 MG TA: 5 | 30 days supply | Qty: 30 | Fill #0

## 2020-04-12 MED FILL — MELOXICAM 7.5 MG TABLET: 7.5 | 30 days supply | Qty: 30 | Fill #0

## 2020-04-12 MED FILL — OMEPRAZOLE DR 40 MG CAPSULE: 40 | 30 days supply | Qty: 30 | Fill #0

## 2020-04-12 NOTE — Discharge Instructions (Addendum)
Take medications as directed Please keep your appointment with your PCP

## 2020-04-12 NOTE — ED Triage Notes (Signed)
Pt presents for medication refill. Amlodipine 5 mg, meloxicam 7.5 mg, omeprazole 40 mg. Denies chest pain, headache, dizziness, vision changes.   Pt requested blood pressure to be check.

## 2020-04-12 NOTE — ED Provider Notes (Signed)
[facility]    CSN: [id number] Arrival date & time: 04/12/20  1200      History   Chief Complaint Chief Complaint  Patient presents with  . Medication Refill    HPI Aaron Dennis is a 65 y.o. male with a history of hypertension comes to urgent care requesting medication refill.  Patient has no symptoms.  No shortness of breath, cough, chest pain or chest pressure.  Patient is compliant with his medications and blood pressure is now routinely measured at home.  No numbness or tingling.   HPI  Past Medical History:  Diagnosis Date  . GERD (gastroesophageal reflux disease)   . Hypertension   . Lung nodule    7 mm LUL nodule    Patient Active Problem List   Diagnosis Date Noted  . Primary osteoarthritis of left knee 01/13/2019  . Primary osteoarthritis of right knee 01/13/2019  . Flat foot 11/28/2017  . Tendonitis of foot 11/28/2017  . Polycythemia 05/12/2017  . Vitamin D deficiency 04/21/2017  . Bony sclerosis 08/05/2016  . Lung nodule   . Chronic pain of both knees 12/05/2015  . Gastroesophageal reflux disease without esophagitis 05/17/2014  . Essential hypertension 05/17/2014  . Coronary artery calcification 08/17/2012  . Chronic abdominal pain 06/24/2012  . Right-sided chest wall pain 06/24/2012  . Constipation, chronic 06/24/2012  . Hyperbilirubinemia 06/24/2012  . Chronic back pain 06/24/2012  . DDD (degenerative disc disease), thoracolumbar 06/24/2012  . History of nephrolithiasis 06/24/2012  . Side pain 06/24/2012  . Low back pain at multiple sites 06/24/2012    History reviewed. No pertinent surgical history.     Home Medications    Prior to Admission medications   Medication Sig Start Date End Date Taking? Authorizing Provider  amLODipine (NORVASC) 5 MG tablet Take 1 tablet (5 mg total) by mouth daily. 04/12/20   Jshaun Abernathy, Myrene Galas, MD  meloxicam (MOBIC) 7.5 MG tablet Take 1 tablet (7.5 mg total) by mouth daily. 04/12/20   Chase Picket,  MD  omeprazole (PRILOSEC) 40 MG capsule Take 1 capsule (40 mg total) by mouth daily. 04/12/20   LampteyMyrene Galas, MD    Family History Family History  Problem Relation Age of Onset  . Healthy Mother   . Diabetes Brother   . Colon polyps Neg Hx   . Colon cancer Neg Hx   . Esophageal cancer Neg Hx   . Rectal cancer Neg Hx   . Stomach cancer Neg Hx     Social History Social History   Tobacco Use  . Smoking status: Former Research scientist (life sciences)  . Smokeless tobacco: Never Used  . Tobacco comment: quit Jan 2018  Vaping Use  . Vaping Use: Never used  Substance Use Topics  . Alcohol use: No  . Drug use: No     Allergies   Patient has no known allergies.   Review of Systems Review of Systems  Eyes: Negative.   Respiratory: Negative.   Cardiovascular: Negative.   Gastrointestinal: Negative.      Physical Exam Triage Vital Signs ED Triage Vitals  Enc Vitals Group     BP 04/12/20 1239 (!) 147/86     Pulse Rate 04/12/20 1239 73     Resp 04/12/20 1239 18     Temp 04/12/20 1239 97.7 F (36.5 C)     Temp Source 04/12/20 1239 Oral     SpO2 04/12/20 1239 98 %     Weight --      Height --  Head Circumference --      Peak Flow --      Pain Score 04/12/20 1238 0     Pain Loc --      Pain Edu? --      Excl. in [place]? --    No data found.  Updated Vital Signs BP (!) 147/86 (BP Location: Right Arm)   Pulse 73   Temp 97.7 F (36.5 C) (Oral)   Resp 18   SpO2 98%   Visual Acuity Right Eye Distance:   Left Eye Distance:   Bilateral Distance:    Right Eye Near:   Left Eye Near:    Bilateral Near:     Physical Exam Vitals and nursing note reviewed.  Constitutional:      General: He is not in acute distress.    Appearance: He is not ill-appearing.  Cardiovascular:     Rate and Rhythm: Normal rate and regular rhythm.     Pulses: Normal pulses.     Heart sounds: Normal heart sounds.  Pulmonary:     Effort: Pulmonary effort is normal.     Breath sounds: Normal breath  sounds.  Neurological:     Mental Status: He is alert.      UC Treatments / Results  Labs (all labs ordered are listed, but only abnormal results are displayed) Labs Reviewed - No data to display  EKG   Radiology No results found.  Procedures Procedures (including critical care time)  Medications Ordered in UC Medications - No data to display  Initial Impression / Assessment and Plan / UC Course  I have reviewed the triage vital signs and the nursing notes.  Pertinent labs & imaging results that were available during my care of the patient were reviewed by me and considered in my medical decision making (see chart for details).     1.  Medication refill: Antihypertensive medication refill sent Patient is advised to follow-up with primary care physician Measure blood pressures routinely at home. Return precautions given. Final Clinical Impressions(s) / UC Diagnoses   Final diagnoses:  Medication refill     Discharge Instructions     Take medications as directed Please keep your appointment with your PCP   ED Prescriptions    Medication Sig Dispense Auth. Provider   amLODipine (NORVASC) 5 MG tablet Take 1 tablet (5 mg total) by mouth daily. 90 tablet Arena Lindahl, Myrene Galas, MD   meloxicam (MOBIC) 7.5 MG tablet Take 1 tablet (7.5 mg total) by mouth daily. 30 tablet Alaiyah Bollman, Myrene Galas, MD   omeprazole (PRILOSEC) 40 MG capsule Take 1 capsule (40 mg total) by mouth daily. 90 capsule Tawan Degroote, Myrene Galas, MD     PDMP not reviewed this encounter.   Chase Picket, MD 04/12/20 [phone]

## 2020-04-24 ENCOUNTER — Other Ambulatory Visit: Payer: Self-pay

## 2020-04-24 ENCOUNTER — Ambulatory Visit: Payer: Self-pay | Attending: Family Medicine

## 2020-06-06 ENCOUNTER — Ambulatory Visit: Payer: No Typology Code available for payment source | Admitting: Sports Medicine

## 2020-06-06 ENCOUNTER — Other Ambulatory Visit: Payer: Self-pay

## 2020-06-06 DIAGNOSIS — M7750 Other enthesopathy of unspecified foot: Secondary | ICD-10-CM

## 2020-06-06 DIAGNOSIS — M2142 Flat foot [pes planus] (acquired), left foot: Secondary | ICD-10-CM

## 2020-06-06 DIAGNOSIS — M722 Plantar fascial fibromatosis: Secondary | ICD-10-CM

## 2020-06-06 DIAGNOSIS — M2141 Flat foot [pes planus] (acquired), right foot: Secondary | ICD-10-CM

## 2020-06-06 DIAGNOSIS — M79671 Pain in right foot: Secondary | ICD-10-CM

## 2020-06-07 ENCOUNTER — Ambulatory Visit: Payer: Self-pay

## 2020-06-07 ENCOUNTER — Encounter: Payer: Self-pay | Admitting: Sports Medicine

## 2020-06-07 ENCOUNTER — Encounter: Payer: Self-pay | Admitting: Orthopaedic Surgery

## 2020-06-07 ENCOUNTER — Ambulatory Visit (INDEPENDENT_AMBULATORY_CARE_PROVIDER_SITE_OTHER): Payer: Self-pay | Admitting: Orthopaedic Surgery

## 2020-06-07 ENCOUNTER — Ambulatory Visit (INDEPENDENT_AMBULATORY_CARE_PROVIDER_SITE_OTHER): Payer: Self-pay

## 2020-06-07 VITALS — Ht 69.0 in | Wt 183.0 lb

## 2020-06-07 DIAGNOSIS — G8929 Other chronic pain: Secondary | ICD-10-CM

## 2020-06-07 DIAGNOSIS — M25562 Pain in left knee: Secondary | ICD-10-CM

## 2020-06-07 DIAGNOSIS — M25561 Pain in right knee: Secondary | ICD-10-CM

## 2020-06-07 MED ORDER — METHYLPREDNISOLONE ACETATE 40 MG/ML IJ SUSP
13.3300 mg | INTRAMUSCULAR | Status: AC | PRN
  Administered 2020-06-07: 13.33 mg via INTRA_ARTICULAR

## 2020-06-07 MED ORDER — BUPIVACAINE HCL 0.25 % IJ SOLN
0.6600 mL | INTRAMUSCULAR | Status: AC | PRN
  Administered 2020-06-07: .66 mL via INTRA_ARTICULAR

## 2020-06-07 MED ORDER — LIDOCAINE HCL 1 % IJ SOLN
3.0000 mL | INTRAMUSCULAR | Status: AC | PRN
  Administered 2020-06-07: 3 mL

## 2020-06-07 NOTE — Progress Notes (Signed)
Office Visit Note   Patient: Aaron Dennis           Date of Birth: [birth year]           MRN: [id number] Visit Date: 06/07/2020              Requested by: Charlott Rakes, MD [place],  [place] [id number] PCP: Charlott Rakes, MD   Assessment & Plan: Visit Diagnoses:  1. Chronic pain of both knees     Plan: Impression is bilateral knee chondromalacia patella.  We have discussed proceeding with bilateral knee cortisone injections today for which she would like to proceed.  We will follow-up with [place] as needed.  Follow-Up Instructions: Return if symptoms worsen or fail to improve.   Orders:  Orders Placed This Encounter  Procedures  . Large Joint Inj: bilateral knee  . XR KNEE 3 VIEW LEFT  . XR KNEE 3 VIEW RIGHT   No orders of the defined types were placed in this encounter.     Procedures: Large Joint Inj: bilateral knee on 06/07/2020 9:54 AM Indications: pain Details: 22 G needle, anterolateral approach Medications (Right): 0.66 mL bupivacaine 0.25 %; 3 mL lidocaine 1 %; 13.33 mg methylPREDNISolone acetate 40 MG/ML Medications (Left): 0.66 mL bupivacaine 0.25 %; 3 mL lidocaine 1 %; 13.33 mg methylPREDNISolone acetate 40 MG/ML      Clinical Data: No additional findings.   Subjective: Chief Complaint  Patient presents with  . Right Knee - Pain  . Left Knee - Pain    HPI is a 65 year old gentleman who comes in today with recurrent bilateral knee pain left greater than right.  He has been dealing with this for the past few years.  No specific injury that he is aware of.  The pain he has is primarily to the lateral aspect is worse with flexion of the knee as well as going from a seated to standing position.  He was seen by [place] back in 2020 where he was sent to 8 weeks of physical therapy.  He was also using topical NSAIDs at the time neither of which relieved his symptoms.  He is currently taking Mobic which occasionally helps.  He has not previously  undergone cortisone injection to either knee.  Review of Systems as detailed in HPI.  All others reviewed and are negative.   Objective: Vital Signs: Ht 5\' 9"  (1.753 m)   Wt 183 lb (83 kg)   BMI 27.02 kg/m   Physical Exam well-developed well-nourished gentleman in no acute distress.  Alert oriented x3.  Ortho Exam bilateral knee examination shows no effusion either side.  No joint line tenderness.  Moderate patellofemoral crepitus.  Ligaments are stable.  He is neurovascular intact distally.  Specialty Comments:  No specialty comments available.  Imaging: XR KNEE 3 VIEW LEFT  Result Date: 06/07/2020 Moderate degenerative changes to the patellofemoral compartment  XR KNEE 3 VIEW RIGHT  Result Date: 06/07/2020 Moderate degenerative changes to the patellofemoral compartment    PMFS History: Patient Active Problem List   Diagnosis Date Noted  . Primary osteoarthritis of left knee 01/13/2019  . Primary osteoarthritis of right knee 01/13/2019  . Flat foot 11/28/2017  . Tendonitis of foot 11/28/2017  . Polycythemia 05/12/2017  . Vitamin D deficiency 04/21/2017  . Bony sclerosis 08/05/2016  . Lung nodule   . Chronic pain of both knees 12/05/2015  . Gastroesophageal reflux disease without esophagitis 05/17/2014  . Essential hypertension 05/17/2014  . Coronary  artery calcification 08/17/2012  . Chronic abdominal pain 06/24/2012  . Right-sided chest wall pain 06/24/2012  . Constipation, chronic 06/24/2012  . Hyperbilirubinemia 06/24/2012  . Chronic back pain 06/24/2012  . DDD (degenerative disc disease), thoracolumbar 06/24/2012  . History of nephrolithiasis 06/24/2012  . Side pain 06/24/2012  . Low back pain at multiple sites 06/24/2012   Past Medical History:  Diagnosis Date  . GERD (gastroesophageal reflux disease)   . Hypertension   . Lung nodule    7 mm LUL nodule    Family History  Problem Relation Age of Onset  . Healthy Mother   . Diabetes Brother   .  Colon polyps Neg Hx   . Colon cancer Neg Hx   . Esophageal cancer Neg Hx   . Rectal cancer Neg Hx   . Stomach cancer Neg Hx     History reviewed. No pertinent surgical history. Social History   Occupational History  . Not on file  Tobacco Use  . Smoking status: Former Research scientist (life sciences)  . Smokeless tobacco: Never Used  . Tobacco comment: quit Jan 2018  Vaping Use  . Vaping Use: Never used  Substance and Sexual Activity  . Alcohol use: No  . Drug use: No  . Sexual activity: Not on file

## 2020-06-07 NOTE — Progress Notes (Signed)
Subjective: Aaron Dennis is a 65 y.o. male patient who presents to office for follow-up evaluation of right foot and ankle pain. 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  Patient is here for also review of MRI results and denies any new problems or issues since last encounter on last year.  Review of Systems  Musculoskeletal: Positive for joint pain.  All other systems reviewed and are negative.   Patient Active Problem List   Diagnosis Date Noted  . Primary osteoarthritis of left knee 01/13/2019  . Primary osteoarthritis of right knee 01/13/2019  . Flat foot 11/28/2017  . Tendonitis of foot 11/28/2017  . Polycythemia 05/12/2017  . Vitamin D deficiency 04/21/2017  . Bony sclerosis 08/05/2016  . Lung nodule   . Chronic pain of both knees 12/05/2015  . Gastroesophageal reflux disease without esophagitis 05/17/2014  . Essential hypertension 05/17/2014  . Coronary artery calcification 08/17/2012  . Chronic abdominal pain 06/24/2012  . Right-sided chest wall pain 06/24/2012  . Constipation, chronic 06/24/2012  . Hyperbilirubinemia 06/24/2012  . Chronic back pain 06/24/2012  . DDD (degenerative disc disease), thoracolumbar 06/24/2012  . History of nephrolithiasis 06/24/2012  . Side pain 06/24/2012  . Low back pain at multiple sites 06/24/2012    Current Outpatient Medications on File Prior to Visit  Medication Sig Dispense Refill  . amLODipine (NORVASC) 5 MG tablet Take 1 tablet (5 mg total) by mouth daily. 90 tablet 1  . amLODipine (NORVASC) 5 MG tablet TAKE 1 TABLET (5 MG TOTAL) BY MOUTH DAILY. 90 tablet 1  . amLODipine (NORVASC) 5 MG tablet TAKE 1 TABLET (5 MG TOTAL) BY MOUTH DAILY. 30 tablet 0  . meloxicam (MOBIC) 7.5 MG tablet Take 1 tablet (7.5 mg total) by mouth daily. 30 tablet 0  . meloxicam (MOBIC) 7.5 MG tablet TAKE 1 TABLET (7.5 MG TOTAL) BY MOUTH DAILY. 30 tablet 0  . meloxicam  (MOBIC) 7.5 MG tablet TAKE 1 TABLET (7.5 MG TOTAL) BY MOUTH DAILY. 30 tablet 0  . omeprazole (PRILOSEC) 40 MG capsule Take 1 capsule (40 mg total) by mouth daily. 90 capsule 0  . omeprazole (PRILOSEC) 40 MG capsule TAKE 1 CAPSULE (40 MG TOTAL) BY MOUTH DAILY. 90 capsule 0   No current facility-administered medications on file prior to visit.    No Known Allergies  Objective:  General: Alert and oriented x3 in no acute distress  Dermatology: No open lesions bilateral lower extremities, no webspace macerations, no ecchymosis bilateral, all nails x 10 are well manicured.  Vascular: Dorsalis Pedis and Posterior Tibial pedal pulses palpable, Capillary Fill Time 3 seconds,(+) pedal hair growth bilateral, no edema bilateral lower extremities, Temperature gradient within normal limits.  Neurology: Johney Maine sensation intact via light touch bilateral.  Musculoskeletal: Mild tenderness with palpation at right medial and lateral ankle gutters and sometimes at first MPJ however there is no reproducible pain noted on today's exam at the plantar and medial heel at the plantar fascia insertion or at peroneal tendon course on the right.  No pain with calf compression bilateral. + pes planus foot type bilateral.   Assessment and Plan: Problem List Items Addressed This Visit      Musculoskeletal and Integument   Tendonitis of foot     Other   Flat foot    Other Visit Diagnoses    Plantar fasciitis of right foot    -  Primary  Right foot pain           -Complete examination performed -MRI is negative for any acute findings only suggestive of inflammation at the Achilles and plantar fascia -Discussed with patient likely that his flareup is related to his work especially with excessive driving with the right foot -Dispensed compression anklet for patient to use to assist with support and edema control at right foot and ankle -Dispensed power steps insoles for patient to use as directed -Recommend  continue with OTC tylenol arthritis or topical pain creams for any residual pain like before -Return to office if fails to continue to improve after several months advised patient to see how he feels 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 Landis Martins, DPM

## 2020-06-25 ENCOUNTER — Other Ambulatory Visit: Payer: Self-pay

## 2020-06-25 ENCOUNTER — Ambulatory Visit: Payer: Self-pay | Admitting: Physician Assistant

## 2020-06-25 VITALS — BP 146/90 | HR 84 | Temp 98.2°F | Resp 18 | Ht 70.0 in | Wt 179.0 lb

## 2020-06-25 DIAGNOSIS — M25562 Pain in left knee: Secondary | ICD-10-CM

## 2020-06-25 DIAGNOSIS — M5135 Other intervertebral disc degeneration, thoracolumbar region: Secondary | ICD-10-CM

## 2020-06-25 DIAGNOSIS — M546 Pain in thoracic spine: Secondary | ICD-10-CM

## 2020-06-25 DIAGNOSIS — K219 Gastro-esophageal reflux disease without esophagitis: Secondary | ICD-10-CM

## 2020-06-25 DIAGNOSIS — Z125 Encounter for screening for malignant neoplasm of prostate: Secondary | ICD-10-CM

## 2020-06-25 DIAGNOSIS — I10 Essential (primary) hypertension: Secondary | ICD-10-CM

## 2020-06-25 DIAGNOSIS — M25561 Pain in right knee: Secondary | ICD-10-CM

## 2020-06-25 DIAGNOSIS — G8929 Other chronic pain: Secondary | ICD-10-CM

## 2020-06-25 DIAGNOSIS — Z13220 Encounter for screening for lipoid disorders: Secondary | ICD-10-CM

## 2020-06-25 MED ORDER — MELOXICAM 7.5 MG PO TABS
7.5000 mg | ORAL_TABLET | Freq: Every day | ORAL | 0 refills | Status: DC
  Filled 2020-06-25: qty 30, 30d supply, fill #0

## 2020-06-25 NOTE — Progress Notes (Signed)
Patient has taken medication today. Patient has not eaten today. Patient complains of upper back and right side pain. Patient reports interrupted sleep due to pain. Patient is requesting xrays at these sites.

## 2020-06-25 NOTE — Patient Instructions (Signed)
I ordered an x-ray to be completed of your back and spine.  Please return to the mobile unit tomorrow morning for fasting labs.  I encourage you to take the meloxicam on a daily basis, continue taking your Prilosec every day make sure you are taking the meloxicam with food.  I encourage you to work on gentle stretching, proper hydration, ice and heat can also be helpful.  I started a referral for you to be seen by orthopedics for further evaluation of your back pain.  Once your lab results are available we will call you to discuss.  Please let us know if there is anything else we can do for you.  Kennieth Rad, PA-C Physician Assistant [facility] [email or web address]  Chronic Back Pain When back pain lasts longer than 3 months, it is called chronic back pain. The cause of your back pain may not be known. Some common causes include:  Wear and tear (degenerative disease) of the bones, ligaments, or disks in your back.  Inflammation and stiffness in your back (arthritis). People who have chronic back pain often go through certain periods in which the pain is more intense (flare-ups). Many people can learn to manage the pain with home care. Follow these instructions at home: Pay attention to any changes in your symptoms. Take these actions to help with your pain: Managing pain and stiffness  If directed, apply ice to the painful area. Your health care provider may recommend applying ice during the first 24-48 hours after a flare-up begins. To do this: ? Put ice in a plastic bag. ? Place a towel between your skin and the bag. ? Leave the ice on for 20 minutes, 2-3 times per day.  If directed, apply heat to the affected area as often as told by your health care provider. Use the heat source that your health care provider recommends, such as a moist heat pack or a heating pad. ? Place a towel  between your skin and the heat source. ? Leave the heat on for 20-30 minutes. ? Remove the heat if your skin turns bright red. This is especially important if you are unable to feel pain, heat, or cold. You may have a greater risk of getting burned.  Try soaking in a warm tub.      Activity  Avoid bending and other activities that make the problem worse.  Maintain a proper position when standing or sitting: ? When standing, keep your upper back and neck straight, with your shoulders pulled back. Avoid slouching. ? When sitting, keep your back straight and relax your shoulders. Do not round your shoulders or pull them backward.  Do not sit or stand in one place for long periods of time.  Take brief periods of rest throughout the day. This will reduce your pain. Resting in a lying or standing position is usually better than sitting to rest.  When you are resting for longer periods, mix in some mild activity or stretching between periods of rest. This will help to prevent stiffness and pain.  Get regular exercise. Ask your health care provider what activities are safe for you.  Do not lift anything that is heavier than 10 lb (4.5 kg), or the limit that you are told, until your health care provider says that it is safe. Always use proper lifting technique, which includes: ? Bending your knees. ? Keeping the load close to your body. ? Avoiding twisting.  Sleep on  a firm mattress in a comfortable position. Try lying on your side with your knees slightly bent. If you lie on your back, put a pillow under your knees.   Medicines  Treatment may include medicines for pain and inflammation taken by mouth or applied to the skin, prescription pain medicine, or muscle relaxants. Take over-the-counter and prescription medicines only as told by your health care provider.  Ask your health care provider if the medicine prescribed to you: ? Requires you to avoid driving or using machinery. ? Can cause  constipation. You may need to take these actions to prevent or treat constipation:  Drink enough fluid to keep your urine pale yellow.  Take over-the-counter or prescription medicines.  Eat foods that are high in fiber, such as beans, whole grains, and fresh fruits and vegetables.  Limit foods that are high in fat and processed sugars, such as fried or sweet foods. General instructions  Do not use any products that contain nicotine or tobacco, such as cigarettes, e-cigarettes, and chewing tobacco. If you need help quitting, ask your health care provider.  Keep all follow-up visits as told by your health care provider. This is important. Contact a health care provider if:  You have pain that is not relieved with rest or medicine.  Your pain gets worse, or you have new pain.  You have a high fever.  You have rapid weight loss.  You have trouble doing your normal activities. Get help right away if:  You have weakness or numbness in one or both of your legs or feet.  You have trouble controlling your bladder or your bowels.  You have severe back pain and have any of the following: ? Nausea or vomiting. ? Pain in your abdomen. ? Shortness of breath or you faint. Summary  Chronic back pain is back pain that lasts longer than 3 months.  When a flare-up begins, apply ice to the painful area for the first 24-48 hours.  Apply a moist heat pad or use a heating pad on the painful area as directed by your health care provider.  When you are resting for longer periods, mix in some mild activity or stretching between periods of rest. This will help to prevent stiffness and pain. This information is not intended to replace advice given to you by your health care provider. Make sure you discuss any questions you have with your health care provider. Document Revised: 03/08/2019 Document Reviewed: 03/08/2019 Elsevier Patient Education  2021 [employer].

## 2020-06-25 NOTE — Progress Notes (Signed)
Established Patient Office Visit  Subjective:  Patient ID: Aaron Dennis, male    DOB: Jun 19, 1955  Age: 65 y.o. MRN: [id number]  CC:  Chief Complaint  Patient presents with  . Back Pain    HPI KRISTIN LAMAGNA reports that he has been having back pain since 2006, denies injury or trauma.  Reports that it has been intermittent, has previously been told that he needs surgery.  Reports that it has become worse and he experiences pain from his neck down his spine, states that it is worse on the right side.  Denies numbness or tingling.  Reports that he has previously been prescribed meloxicam 7.5 mg and will take this when the pain becomes severe, states that it does offer him some relief.     Past Medical History:  Diagnosis Date  . GERD (gastroesophageal reflux disease)   . Hypertension   . Lung nodule    7 mm LUL nodule    History reviewed. No pertinent surgical history.  Family History  Problem Relation Age of Onset  . Healthy Mother   . Diabetes Brother   . Colon polyps Neg Hx   . Colon cancer Neg Hx   . Esophageal cancer Neg Hx   . Rectal cancer Neg Hx   . Stomach cancer Neg Hx     Social History   Socioeconomic History  . Marital status: Married    Spouse name: Not on file  . Number of children: Not on file  . Years of education: Not on file  . Highest education level: Not on file  Occupational History  . Not on file  Tobacco Use  . Smoking status: Former Research scientist (life sciences)  . Smokeless tobacco: Never Used  . Tobacco comment: quit Jan 2018  Vaping Use  . Vaping Use: Never used  Substance and Sexual Activity  . Alcohol use: No  . Drug use: No  . Sexual activity: Not on file  Other Topics Concern  . Not on file  Social History Narrative  . Not on file   Social Determinants of Health   Financial Resource Strain: Not on file  Food Insecurity: Not on file  Transportation Needs: Not on file  Physical Activity: Not on file  Stress: Not on file  Social Connections:  Not on file  Intimate Partner Violence: Not on file    Outpatient Medications Prior to Visit  Medication Sig Dispense Refill  . amLODipine (NORVASC) 5 MG tablet TAKE 1 TABLET (5 MG TOTAL) BY MOUTH DAILY. 30 tablet 0  . omeprazole (PRILOSEC) 40 MG capsule Take 1 capsule (40 mg total) by mouth daily. 90 capsule 0  . amLODipine (NORVASC) 5 MG tablet Take 1 tablet (5 mg total) by mouth daily. 90 tablet 1  . amLODipine (NORVASC) 5 MG tablet TAKE 1 TABLET (5 MG TOTAL) BY MOUTH DAILY. 90 tablet 1  . meloxicam (MOBIC) 7.5 MG tablet Take 1 tablet (7.5 mg total) by mouth daily. 30 tablet 0  . meloxicam (MOBIC) 7.5 MG tablet TAKE 1 TABLET (7.5 MG TOTAL) BY MOUTH DAILY. 30 tablet 0  . meloxicam (MOBIC) 7.5 MG tablet TAKE 1 TABLET (7.5 MG TOTAL) BY MOUTH DAILY. 30 tablet 0  . omeprazole (PRILOSEC) 40 MG capsule TAKE 1 CAPSULE (40 MG TOTAL) BY MOUTH DAILY. 90 capsule 0   No facility-administered medications prior to visit.    No Known Allergies  ROS Review of Systems  Constitutional: Negative.   Eyes: Negative.   Respiratory: Negative for  shortness of breath.   Cardiovascular: Negative for chest pain and leg swelling.  Gastrointestinal: Negative.   Endocrine: Negative.   Genitourinary: Negative.   Musculoskeletal: Positive for arthralgias and back pain.  Skin: Negative.   Allergic/Immunologic: Negative.   Neurological: Negative for headaches.  Hematological: Negative.   Psychiatric/Behavioral: Negative.       Objective:    Physical Exam Vitals and nursing note reviewed.  Constitutional:      Appearance: Normal appearance.  HENT:     Head: Normocephalic and atraumatic.     Right Ear: External ear normal.     Left Ear: External ear normal.     Nose: Nose normal.     Mouth/Throat:     Mouth: Mucous membranes are moist.     Pharynx: Oropharynx is clear.  Eyes:     Extraocular Movements: Extraocular movements intact.     Conjunctiva/sclera: Conjunctivae normal.     Pupils: Pupils  are equal, round, and reactive to light.  Cardiovascular:     Rate and Rhythm: Normal rate and regular rhythm.     Pulses: Normal pulses.     Heart sounds: Normal heart sounds.  Pulmonary:     Effort: Pulmonary effort is normal.     Breath sounds: Normal breath sounds.  Musculoskeletal:     Cervical back: Normal, normal range of motion and neck supple. No swelling or tenderness.     Thoracic back: Tenderness present. Decreased range of motion.     Lumbar back: Decreased range of motion.  Skin:    General: Skin is warm.  Neurological:     General: No focal deficit present.     Mental Status: He is alert and oriented to person, place, and time.  Psychiatric:        Mood and Affect: Mood normal.        Behavior: Behavior normal.        Thought Content: Thought content normal.        Judgment: Judgment normal.     BP (!) 146/90 (BP Location: Left Arm, Patient Position: Sitting, Cuff Size: Normal)   Pulse 84   Temp 98.2 F (36.8 C) (Oral)   Resp 18   Ht 5\' 10"  (1.778 m)   Wt 179 lb (81.2 kg)   SpO2 100%   BMI 25.68 kg/m  Wt Readings from Last 3 Encounters:  06/25/20 179 lb (81.2 kg)  06/07/20 183 lb (83 kg)  07/11/19 183 lb (83 kg)     Health Maintenance Due  Topic Date Due  . COVID-19 Vaccine (1) Never done  . TETANUS/TDAP  Never done    There are no preventive care reminders to display for this patient.  Lab Results  Component Value Date   TSH 1.810 04/08/2017   Lab Results  Component Value Date   WBC 7.3 09/13/2017   HGB 17.0 09/13/2017   HCT 51.0 (H) 09/13/2017   MCV 83.3 09/13/2017   PLT 193 09/13/2017   Lab Results  Component Value Date   NA 138 11/29/2018   K 3.8 11/29/2018   CO2 22 11/29/2018   GLUCOSE 83 11/29/2018   BUN 18 11/29/2018   CREATININE 0.99 11/29/2018   BILITOT 1.1 11/29/2018   ALKPHOS 173 (H) 11/29/2018   AST 22 11/29/2018   ALT 22 11/29/2018   PROT 6.9 11/29/2018   ALBUMIN 4.6 11/29/2018   CALCIUM 9.1 11/29/2018   ANIONGAP  11 07/12/2017   Lab Results  Component Value Date   CHOL 171  04/08/2017   Lab Results  Component Value Date   HDL 32 (L) 04/08/2017   Lab Results  Component Value Date   LDLCALC 121 (H) 04/08/2017   Lab Results  Component Value Date   TRIG 91 04/08/2017   Lab Results  Component Value Date   CHOLHDL 5.3 (H) 04/08/2017   Lab Results  Component Value Date   HGBA1C 5.4 (A) 11/29/2018      Assessment & Plan:   Problem List Items Addressed This Visit      Cardiovascular and Mediastinum   Essential hypertension   Relevant Orders   CBC with Differential/Platelet   Comp. Metabolic Panel (12)   TSH     Digestive   Gastroesophageal reflux disease without esophagitis     Musculoskeletal and Integument   DDD (degenerative disc disease), thoracolumbar   Relevant Medications   meloxicam (MOBIC) 7.5 MG tablet     Other   Chronic back pain - Primary (Chronic)   Relevant Medications   meloxicam (MOBIC) 7.5 MG tablet   Other Relevant Orders   Ambulatory referral to Orthopedic Surgery   DG Cervical Spine Complete   Chronic pain of both knees   Relevant Medications   meloxicam (MOBIC) 7.5 MG tablet    Other Visit Diagnoses    Screening PSA (prostate specific antigen)       Relevant Orders   PSA   Screening for lipid disorders       Relevant Orders   Lipid panel    1. Chronic bilateral thoracic back pain Encouraged patient to increase hydration, gentle stretching, continue meloxicam.  Red flags given for prompt reevaluation - Ambulatory referral to Orthopedic Surgery - DG Cervical Spine Complete; Future  2. Essential hypertension Continue current regimen - CBC with Differential/Platelet; Future - Comp. Metabolic Panel (12); Future - TSH; Future  3. Gastroesophageal reflux disease without esophagitis Encourage compliance to Prilosec while taking meloxicam  4. DDD (degenerative disc disease), thoracolumbar Refill given - meloxicam (MOBIC) 7.5 MG tablet; Take  1 tablet (7.5 mg total) by mouth daily.  Dispense: 30 tablet; Refill: 0   5. Screening PSA (prostate specific antigen)  - PSA; Future  6. Screening for lipid disorders Return for fasting labs - Lipid panel; Future    I have reviewed the patient's medical history (PMH, PSH, Social History, Family History, Medications, and allergies) , and have been updated if relevant. I spent 32 minutes reviewing chart and  face to face time with patient.     Meds ordered this encounter  Medications  . meloxicam (MOBIC) 7.5 MG tablet    Sig: Take 1 tablet (7.5 mg total) by mouth daily.    Dispense:  30 tablet    Refill:  0    Order Specific Question:   Supervising Provider    Answer:   Elsie Stain [[id number]]    Follow-up: Return in about 1 day (around 06/26/2020) for Fasting  labs.    Loraine Grip Mayers, PA-C

## 2020-06-26 ENCOUNTER — Other Ambulatory Visit: Payer: Self-pay

## 2020-06-26 DIAGNOSIS — I10 Essential (primary) hypertension: Secondary | ICD-10-CM

## 2020-06-26 DIAGNOSIS — Z125 Encounter for screening for malignant neoplasm of prostate: Secondary | ICD-10-CM

## 2020-06-26 DIAGNOSIS — Z13220 Encounter for screening for lipoid disorders: Secondary | ICD-10-CM

## 2020-06-26 DIAGNOSIS — E7849 Other hyperlipidemia: Secondary | ICD-10-CM

## 2020-06-26 NOTE — Progress Notes (Signed)
Patient tolerated blood draw well today. 

## 2020-06-27 ENCOUNTER — Other Ambulatory Visit: Payer: Self-pay

## 2020-06-27 ENCOUNTER — Ambulatory Visit (HOSPITAL_COMMUNITY)
Admission: RE | Admit: 2020-06-27 | Discharge: 2020-06-27 | Disposition: A | Discharge status: Home / Self Care | Payer: Medicaid Other | Source: Ambulatory Visit | Attending: Physician Assistant | Admitting: Physician Assistant

## 2020-06-27 ENCOUNTER — Ambulatory Visit (INDEPENDENT_AMBULATORY_CARE_PROVIDER_SITE_OTHER): Payer: Self-pay | Admitting: Family Medicine

## 2020-06-27 ENCOUNTER — Encounter: Payer: Self-pay | Admitting: Family Medicine

## 2020-06-27 ENCOUNTER — Other Ambulatory Visit: Payer: Self-pay | Admitting: Family Medicine

## 2020-06-27 DIAGNOSIS — M546 Pain in thoracic spine: Secondary | ICD-10-CM | POA: Insufficient documentation

## 2020-06-27 DIAGNOSIS — G8929 Other chronic pain: Secondary | ICD-10-CM | POA: Diagnosis present

## 2020-06-27 LAB — COMP. METABOLIC PANEL (12)
AST: 20 IU/L (ref 0–40)
Albumin/Globulin Ratio: 1.7 (ref 1.2–2.2)
Albumin: 4.3 g/dL (ref 3.8–4.8)
Alkaline Phosphatase: 170 IU/L — ABNORMAL HIGH (ref 44–121)
BUN/Creatinine Ratio: 20 (ref 10–24)
BUN: 19 mg/dL (ref 8–27)
Bilirubin Total: 1.9 mg/dL — ABNORMAL HIGH (ref 0.0–1.2)
Calcium: 9 mg/dL (ref 8.6–10.2)
Chloride: 101 mmol/L (ref 96–106)
Creatinine, Ser: 0.95 mg/dL (ref 0.76–1.27)
Globulin, Total: 2.6 g/dL (ref 1.5–4.5)
Glucose: 81 mg/dL (ref 65–99)
Potassium: 3.6 mmol/L (ref 3.5–5.2)
Sodium: 139 mmol/L (ref 134–144)
Total Protein: 6.9 g/dL (ref 6.0–8.5)
eGFR: 89 mL/min/{1.73_m2} (ref >59)

## 2020-06-27 LAB — CBC WITH DIFFERENTIAL/PLATELET
Basophils Absolute: 0 10*3/uL (ref 0.0–0.2)
Basos: 1 %
EOS (ABSOLUTE): 0 10*3/uL (ref 0.0–0.4)
Eos: 1 %
Hematocrit: 54.5 % — ABNORMAL HIGH (ref 37.5–51.0)
Hemoglobin: 17.8 g/dL — ABNORMAL HIGH (ref 13.0–17.7)
Immature Grans (Abs): 0 10*3/uL (ref 0.0–0.1)
Immature Granulocytes: 0 %
Lymphocytes Absolute: 2.2 10*3/uL (ref 0.7–3.1)
Lymphs: 34 %
MCH: 26.9 pg (ref 26.6–33.0)
MCHC: 32.7 g/dL (ref 31.5–35.7)
MCV: 83 fL (ref 79–97)
Monocytes Absolute: 0.6 10*3/uL (ref 0.1–0.9)
Monocytes: 9 %
Neutrophils Absolute: 3.7 10*3/uL (ref 1.4–7.0)
Neutrophils: 55 %
Platelets: 181 10*3/uL (ref 150–450)
RBC: 6.61 x10E6/uL — ABNORMAL HIGH (ref 4.14–5.80)
RDW: 16.3 % — ABNORMAL HIGH (ref 11.6–15.4)
WBC: 6.6 10*3/uL (ref 3.4–10.8)

## 2020-06-27 LAB — LIPID PANEL
Chol/HDL Ratio: 5.2 ratio — ABNORMAL HIGH (ref 0.0–5.0)
Cholesterol, Total: 198 mg/dL (ref 100–199)
HDL: 38 mg/dL — ABNORMAL LOW (ref >39)
LDL Chol Calc (NIH): 147 mg/dL — ABNORMAL HIGH (ref 0–99)
Triglycerides: 69 mg/dL (ref 0–149)
VLDL Cholesterol Cal: 13 mg/dL (ref 5–40)

## 2020-06-27 LAB — TSH: TSH: 1.5 u[IU]/mL (ref 0.450–4.500)

## 2020-06-27 LAB — PSA: Prostate Specific Ag, Serum: 1.7 ng/mL (ref 0.0–4.0)

## 2020-06-27 MED ORDER — ATORVASTATIN CALCIUM 10 MG PO TABS
10.0000 mg | ORAL_TABLET | Freq: Every day | ORAL | 0 refills | Status: DC
  Filled 2020-06-27: qty 90, 90d supply, fill #0

## 2020-06-27 MED ORDER — OMEPRAZOLE 40 MG PO CPDR
40.0000 mg | DELAYED_RELEASE_CAPSULE | Freq: Every day | ORAL | 1 refills | Status: DC
  Filled 2020-06-27: qty 30, 30d supply, fill #0

## 2020-06-27 MED ORDER — AMLODIPINE BESYLATE 5 MG PO TABS
5.0000 mg | ORAL_TABLET | Freq: Every day | ORAL | 0 refills | Status: DC
  Filled 2020-06-27: qty 30, 30d supply, fill #0

## 2020-06-27 MED ORDER — VITAMIN D-3 125 MCG (5000 UT) PO TABS
1.0000 | ORAL_TABLET | Freq: Every day | ORAL | 3 refills | Status: DC
  Filled 2020-06-27: qty 90, fill #0

## 2020-06-27 MED ORDER — BACLOFEN 10 MG PO TABS
5.0000 mg | ORAL_TABLET | Freq: Every evening | ORAL | 3 refills | Status: DC | PRN
  Filled 2020-06-27: qty 30, 30d supply, fill #0

## 2020-06-27 NOTE — Telephone Encounter (Signed)
Patient called and advised he is overdue for an appointment and will need an OV. He verbalized understanding. Appointment for physical scheduled for 08/14/20 at 1450 with Dr. Margarita Rana. He says [place] be [place] and if so, he will call and cancel. If not, he will come in for the visit.

## 2020-06-27 NOTE — Addendum Note (Signed)
Addended by: Kennieth Rad on: 06/27/2020 11:34 AM   Modules accepted: Orders

## 2020-06-27 NOTE — Progress Notes (Signed)
Office Visit Note   Patient: Aaron Dennis           Date of Birth: 1955-09-16           MRN: [id number] Visit Date: 06/27/2020 Requested by: Mayers, Loraine Grip, PA-C [id number] [place],  [place] [address] PCP: Charlott Rakes, MD  Subjective: Chief Complaint  Patient presents with  . Spine - Pain    Chronic pain between the shoulder blades and down to the lower back. The pain is more on the right side of his back, around the shoulder blade and down to the lower back. Has seen Dr. Arnoldo Morale in the past - any treatment was put off due to lack of insurance. Had Csp xray this morning - images available for viewing, but no report yet.    HPI: He is here for back pain.  He states that he has had pain between the shoulder blades in the thoracic area since approximately 2006.  He has seen Dr. Arnoldo Morale in the past and was told that he might need surgery but then his insurance ran out and he was not able to have it.  Once he did get insurance, he was told that it surgery would probably not benefit him and that he should see a pain clinic.  He did not do that either because of insurance reasons.  Over the years he has been to physical therapy twice but he did not notice much improvement, he states that he was mainly doing some exercises.  Meloxicam does help with his pain.  Looking back through his chart he has a history of severe vitamin D deficiency with levels of 8 and 9.7.               ROS:   All other systems were reviewed and are negative.  Objective: Vital Signs: There were no vitals taken for this visit.  Physical Exam:  General:  Alert and oriented, in no acute distress. Pulm:  Breathing unlabored. Psy:  Normal mood, congruent affect. Skin: He has an epidermal inclusion cyst in the right upper thoracic area. Back: He has no significant bony tenderness along the thoracic spinous processes.  He has tender trigger points on either side of the thoracic spine in the mid thoracic  region.  Palpation of the trigger point seems to reproduce his pain.  Imaging: No results found.  Assessment & Plan: 1.  Chronic thoracic back pain with history of disc protrusions, but exam suggests more of a myofascial component.  Vitamin D deficiency could be contributing. -We will try physical therapy again for dry needling and other techniques.  We will treat with vitamin D3, baclofen as needed.  Follow-up as needed.     Procedures: No procedures performed        PMFS History: Patient Active Problem List   Diagnosis Date Noted  . Primary osteoarthritis of left knee 01/13/2019  . Primary osteoarthritis of right knee 01/13/2019  . Flat foot 11/28/2017  . Tendonitis of foot 11/28/2017  . Polycythemia 05/12/2017  . Vitamin D deficiency 04/21/2017  . Bony sclerosis 08/05/2016  . Lung nodule   . Chronic pain of both knees 12/05/2015  . Gastroesophageal reflux disease without esophagitis 05/17/2014  . Essential hypertension 05/17/2014  . Coronary artery calcification 08/17/2012  . Abnormal LFTs (liver function tests) 07/11/2012  . Chronic abdominal pain 06/24/2012  . Right-sided chest wall pain 06/24/2012  . Constipation, chronic 06/24/2012  . Hyperbilirubinemia 06/24/2012  . Chronic back  pain 06/24/2012  . DDD (degenerative disc disease), thoracolumbar 06/24/2012  . History of nephrolithiasis 06/24/2012  . Side pain 06/24/2012  . Low back pain at multiple sites 06/24/2012   Past Medical History:  Diagnosis Date  . GERD (gastroesophageal reflux disease)   . Hypertension   . Lung nodule    7 mm LUL nodule    Family History  Problem Relation Age of Onset  . Healthy Mother   . Diabetes Brother   . Colon polyps Neg Hx   . Colon cancer Neg Hx   . Esophageal cancer Neg Hx   . Rectal cancer Neg Hx   . Stomach cancer Neg Hx     No past surgical history on file. Social History   Occupational History  . Not on file  Tobacco Use  . Smoking status: Former Research scientist (life sciences)   . Smokeless tobacco: Never Used  . Tobacco comment: quit Jan 2018  Vaping Use  . Vaping Use: Never used  Substance and Sexual Activity  . Alcohol use: No  . Drug use: No  . Sexual activity: Not on file

## 2020-06-27 NOTE — Telephone Encounter (Signed)
Requested medication (s) are due for refill today: Yes  Requested medication (s) are on the active medication list: Yes  Last refill:  04/12/20  Future visit scheduled: Yes  Notes to clinic:  Unable to refill per protocol, last refill by another provider.      Requested Prescriptions  Pending Prescriptions Disp Refills   omeprazole (PRILOSEC) 40 MG capsule 90 capsule 0    Sig: TAKE 1 CAPSULE (40 MG TOTAL) BY MOUTH DAILY.      Gastroenterology: Proton Pump Inhibitors Failed - 06/27/2020 10:23 AM      Failed - Valid encounter within last 12 months    Recent Outpatient Visits           1 year ago Thoracolumbar back pain   [place], Enobong, MD   1 year ago Diabetes mellitus screening   [place], Enobong, MD   2 years ago Protruded cervical disc   [place], Deborah B, MD   2 years ago DDD (degenerative disc disease), thoracolumbar   [place], Dionne Bucy, [place]   2 years ago Other hemorrhoids   [place], Charlane Ferretti, MD       Future Appointments             In 1 month Charlott Rakes, MD [place]              Signed Prescriptions Disp Refills   amLODipine (NORVASC) 5 MG tablet 60 tablet 0    Sig: Take 1 tablet (5 mg total) by mouth daily. OFFICE VISIT NEEDED FOR ADDITIONAL REFILLS      Cardiovascular:  Calcium Channel Blockers Failed - 06/27/2020 10:23 AM      Failed - Last BP in normal range    BP Readings from Last 1 Encounters:  06/25/20 (!) 146/90          Failed - Valid encounter within last 6 months    Recent Outpatient Visits           1 year ago Thoracolumbar back pain   [place], Charlane Ferretti, MD   1 year ago Diabetes mellitus screening   [place],  Enobong, MD   2 years ago Protruded cervical disc   [place], Deborah B, MD   2 years ago DDD (degenerative disc disease), thoracolumbar   [place], Dionne Bucy, [place]   2 years ago Other hemorrhoids   [place], Enobong, MD       Future Appointments             In 1 month Charlott Rakes, MD [place]

## 2020-06-28 ENCOUNTER — Other Ambulatory Visit: Payer: Self-pay

## 2020-07-03 ENCOUNTER — Telehealth: Payer: Self-pay | Admitting: *Deleted

## 2020-07-03 NOTE — Telephone Encounter (Signed)
Patient verified DOB Patient is aware of thyroid, liver, and prostate being normal. Patient is aware of his PCP and recent provider agree patient needs to begin a cholesterol medication along with adhering to a low cholesterol diet. Patient is also aware of xray results and has picked up recommended medication.

## 2020-07-03 NOTE — Telephone Encounter (Signed)
-----   Message from Kennieth Rad, [place] sent at 06/27/2020 11:34 AM EDT ----- Please call patient and let him know that his thyroid function and kidney function are within normal limits.  His screening for prostate cancer was negative.  He continues to have elevated liver enzymes, however they do remain stable.  He did have a full work-up for this last year.  His cholesterol is elevated, his risk of a cardiovascular event in the next 10 years is 21%.  After consulting with Dr. Margarita Rana, it is agreed that he needs to start a cholesterol medication despite his elevated liver enzymes.  He needs to follow a low-cholesterol diet as well.  Cholesterol medication sent to his pharmacy.  The 10-year ASCVD risk score Mikey Bussing DC Brooke Bonito., et al., 2013) is: 21%   Values used to calculate the score:     Age: 39 years     Sex: Male     Is Non-Hispanic African American: Yes     Diabetic: No     Tobacco smoker: No     Systolic Blood Pressure: 381 mmHg     Is BP treated: Yes     HDL Cholesterol: 38 mg/dL     Total Cholesterol: 198 mg/dL

## 2020-07-10 ENCOUNTER — Ambulatory Visit: Payer: Self-pay | Admitting: Physical Therapy

## 2020-07-16 ENCOUNTER — Encounter: Payer: Self-pay | Admitting: Family Medicine

## 2020-07-16 ENCOUNTER — Ambulatory Visit (INDEPENDENT_AMBULATORY_CARE_PROVIDER_SITE_OTHER): Payer: Medicaid Other | Admitting: Family Medicine

## 2020-07-16 ENCOUNTER — Other Ambulatory Visit: Payer: Self-pay

## 2020-07-16 DIAGNOSIS — M546 Pain in thoracic spine: Secondary | ICD-10-CM

## 2020-07-16 DIAGNOSIS — G8929 Other chronic pain: Secondary | ICD-10-CM | POA: Diagnosis not present

## 2020-07-16 DIAGNOSIS — R42 Dizziness and giddiness: Secondary | ICD-10-CM | POA: Diagnosis not present

## 2020-07-16 NOTE — Progress Notes (Signed)
Office Visit Note   Patient: Aaron Dennis           Date of Birth: 03-13-1955           MRN: [id number] Visit Date: 07/16/2020 Requested by: Charlott Rakes, MD [place],  [place] [id number] PCP: Charlott Rakes, MD  Subjective: Chief Complaint  Patient presents with  . Middle Back - Pain    Continues to have pain in the back, between the shoulder blades and into the right flank. Cannot lie on the right side. If he bends his head forward, his head will "feel heavy" and he gets dizzy. Baclofen helped a little. Has not had any PT yet. In the past, he has gone to [facility] and has had no relief there. Does not wish to go back to that facility.     HPI: He is here for follow-up chronic thoracic pain.  He is having some dizziness when he looks down for a while and then lifts his head up as well.  We had prescribed physical therapy but he did not want to go to the location we referred him to.  He would like to go somewhere else.              ROS:   All other systems were reviewed and are negative.  Objective: Vital Signs: There were no vitals taken for this visit.  Physical Exam:  General:  Alert and oriented, in no acute distress. Pulm:  Breathing unlabored. Psy:  Normal mood, congruent affec    Imaging: No results found.  Assessment & Plan: 1.  Chronic thoracic pain - We will try physical therapy on [address].  Treatment for possible vertigo as well.  If he fails to improve, then MRI thoracic spine followed by possible epidural injection if indicated.     Procedures: No procedures performed        PMFS History: Patient Active Problem List   Diagnosis Date Noted  . Primary osteoarthritis of left knee 01/13/2019  . Primary osteoarthritis of right knee 01/13/2019  . Flat foot 11/28/2017  . Tendonitis of foot 11/28/2017  . Polycythemia 05/12/2017  . Vitamin D deficiency 04/21/2017  . Bony sclerosis 08/05/2016  . Lung nodule   . Chronic pain of  both knees 12/05/2015  . Gastroesophageal reflux disease without esophagitis 05/17/2014  . Essential hypertension 05/17/2014  . Coronary artery calcification 08/17/2012  . Abnormal LFTs (liver function tests) 07/11/2012  . Chronic abdominal pain 06/24/2012  . Right-sided chest wall pain 06/24/2012  . Constipation, chronic 06/24/2012  . Hyperbilirubinemia 06/24/2012  . Chronic back pain 06/24/2012  . DDD (degenerative disc disease), thoracolumbar 06/24/2012  . History of nephrolithiasis 06/24/2012  . Side pain 06/24/2012  . Low back pain at multiple sites 06/24/2012   Past Medical History:  Diagnosis Date  . GERD (gastroesophageal reflux disease)   . Hypertension   . Lung nodule    7 mm LUL nodule    Family History  Problem Relation Age of Onset  . Healthy Mother   . Diabetes Brother   . Colon polyps Neg Hx   . Colon cancer Neg Hx   . Esophageal cancer Neg Hx   . Rectal cancer Neg Hx   . Stomach cancer Neg Hx     History reviewed. No pertinent surgical history. Social History   Occupational History  . Not on file  Tobacco Use  . Smoking status: Former Research scientist (life sciences)  . Smokeless tobacco: Never Used  .  Tobacco comment: quit Jan 2018  Vaping Use  . Vaping Use: Never used  Substance and Sexual Activity  . Alcohol use: No  . Drug use: No  . Sexual activity: Not on file

## 2020-07-22 ENCOUNTER — Ambulatory Visit: Payer: Medicaid Other | Admitting: Physical Therapy

## 2020-08-14 ENCOUNTER — Ambulatory Visit: Payer: Medicare Other | Attending: Family Medicine

## 2020-08-14 ENCOUNTER — Other Ambulatory Visit: Payer: Self-pay

## 2020-08-14 ENCOUNTER — Encounter: Payer: Self-pay | Admitting: Family Medicine

## 2020-08-14 ENCOUNTER — Ambulatory Visit: Payer: Self-pay | Attending: Family Medicine | Admitting: Family Medicine

## 2020-08-14 VITALS — BP 125/86

## 2020-08-14 VITALS — BP 123/70 | HR 78 | Ht 70.0 in | Wt 183.0 lb

## 2020-08-14 DIAGNOSIS — M542 Cervicalgia: Secondary | ICD-10-CM | POA: Diagnosis present

## 2020-08-14 DIAGNOSIS — M5135 Other intervertebral disc degeneration, thoracolumbar region: Secondary | ICD-10-CM

## 2020-08-14 DIAGNOSIS — M546 Pain in thoracic spine: Secondary | ICD-10-CM

## 2020-08-14 DIAGNOSIS — Z0000 Encounter for general adult medical examination without abnormal findings: Secondary | ICD-10-CM

## 2020-08-14 DIAGNOSIS — M545 Low back pain, unspecified: Secondary | ICD-10-CM | POA: Diagnosis present

## 2020-08-14 DIAGNOSIS — I10 Essential (primary) hypertension: Secondary | ICD-10-CM

## 2020-08-14 DIAGNOSIS — M5441 Lumbago with sciatica, right side: Secondary | ICD-10-CM | POA: Diagnosis present

## 2020-08-14 DIAGNOSIS — R293 Abnormal posture: Secondary | ICD-10-CM

## 2020-08-14 DIAGNOSIS — Z131 Encounter for screening for diabetes mellitus: Secondary | ICD-10-CM

## 2020-08-14 DIAGNOSIS — R42 Dizziness and giddiness: Secondary | ICD-10-CM

## 2020-08-14 DIAGNOSIS — R252 Cramp and spasm: Secondary | ICD-10-CM | POA: Diagnosis present

## 2020-08-14 DIAGNOSIS — G8929 Other chronic pain: Secondary | ICD-10-CM | POA: Insufficient documentation

## 2020-08-14 DIAGNOSIS — M25562 Pain in left knee: Secondary | ICD-10-CM

## 2020-08-14 DIAGNOSIS — M25561 Pain in right knee: Secondary | ICD-10-CM

## 2020-08-14 DIAGNOSIS — E7849 Other hyperlipidemia: Secondary | ICD-10-CM

## 2020-08-14 MED ORDER — AMLODIPINE BESYLATE 5 MG PO TABS
5.0000 mg | ORAL_TABLET | Freq: Every day | ORAL | 0 refills | Status: DC
Start: 2020-08-14 — End: 2020-10-17
  Filled 2020-08-14: qty 60, 60d supply, fill #0

## 2020-08-14 MED ORDER — ATORVASTATIN CALCIUM 10 MG PO TABS
10.0000 mg | ORAL_TABLET | Freq: Every day | ORAL | 1 refills | Status: DC
  Filled 2020-08-14 – 2020-11-04 (×2): qty 90, 90d supply, fill #0

## 2020-08-14 MED ORDER — BACLOFEN 10 MG PO TABS
5.0000 mg | ORAL_TABLET | Freq: Every evening | ORAL | 1 refills | Status: DC | PRN
  Filled 2020-08-14: qty 90, 90d supply, fill #0

## 2020-08-14 MED ORDER — MELOXICAM 7.5 MG PO TABS
7.5000 mg | ORAL_TABLET | Freq: Every day | ORAL | 1 refills | Status: DC
  Filled 2020-08-14: qty 90, 90d supply, fill #0

## 2020-08-14 MED ORDER — MECLIZINE HCL 25 MG PO TABS
25.0000 mg | ORAL_TABLET | Freq: Two times a day (BID) | ORAL | 1 refills | Status: DC | PRN
  Filled 2020-08-14: qty 60, 30d supply, fill #0

## 2020-08-14 NOTE — Progress Notes (Signed)
CPE

## 2020-08-14 NOTE — Progress Notes (Signed)
Subjective:  Patient ID: Aaron Dennis, male    DOB: 05-Oct-1955  Age: 65 y.o. MRN: [id number]  CC: Annual Exam   HPI Aaron Dennis  is a 65 year old male with a history of hypertension, polycythemia (s/p phlebotomy in the past), chronic thoracolumbar pain, GERD who presents for an annual physical. Up-to-date on colonoscopy with next colonoscopy due in 04/2027.  Declines PSA, declines Shingrix and Tdap. Interval History: He started PT today for his back pain which is mostly in his R thoracolumbar region He complains of dizziness on turning his head. He has no nausea or vomiting. He would like his vitamin B12 checked and A1c done.  Past Medical History:  Diagnosis Date   GERD (gastroesophageal reflux disease)    Hypertension    Lung nodule    7 mm LUL nodule    History reviewed. No pertinent surgical history.  Family History  Problem Relation Age of Onset   Healthy Mother    Diabetes Brother    Colon polyps Neg Hx    Colon cancer Neg Hx    Esophageal cancer Neg Hx    Rectal cancer Neg Hx    Stomach cancer Neg Hx     No Known Allergies  Outpatient Medications Prior to Visit  Medication Sig Dispense Refill   Cholecalciferol (VITAMIN D-3) 125 MCG (5000 UT) TABS Take 1 tablet by mouth daily. 90 tablet 3   omeprazole (PRILOSEC) 40 MG capsule Take 1 capsule (40 mg total) by mouth daily. 30 capsule 1   amLODipine (NORVASC) 5 MG tablet Take 1 tablet (5 mg total) by mouth daily. OFFICE VISIT NEEDED FOR ADDITIONAL REFILLS 60 tablet 0   atorvastatin (LIPITOR) 10 MG tablet Take 1 tablet (10 mg total) by mouth daily. 90 tablet 0   baclofen (LIORESAL) 10 MG tablet Take 0.5-1 tablets (5-10 mg total) by mouth at bedtime as needed for muscle spasms. 30 each 3   meloxicam (MOBIC) 7.5 MG tablet Take 1 tablet (7.5 mg total) by mouth daily. 30 tablet 0   No facility-administered medications prior to visit.     ROS Review of Systems  Constitutional:  Negative for activity change and  appetite change.  HENT:  Negative for sinus pressure and sore throat.   Eyes:  Negative for visual disturbance.  Respiratory:  Negative for cough, chest tightness and shortness of breath.   Cardiovascular:  Negative for chest pain and leg swelling.  Gastrointestinal:  Negative for abdominal distention, abdominal pain, constipation and diarrhea.  Endocrine: Negative.   Genitourinary:  Negative for dysuria.  Musculoskeletal:  Positive for back pain and neck pain. Negative for joint swelling and myalgias.  Skin:  Negative for rash.  Allergic/Immunologic: Negative.   Neurological:  Positive for light-headedness. Negative for weakness and numbness.  Psychiatric/Behavioral:  Negative for dysphoric mood and suicidal ideas.    Objective:  BP 123/70   Pulse 78   Ht 5\' 10"  (1.778 m)   Wt 183 lb (83 kg)   SpO2 98%   BMI 26.26 kg/m   BP/Weight 08/14/2020 08/14/2020 8/58/8502  Systolic BP 774 128 786  Diastolic BP 86 70 90  Wt. (Lbs) - 183 179  BMI - 26.26 25.68      Physical Exam Constitutional:      Appearance: He is well-developed.  HENT:     Head: Normocephalic and atraumatic.     Right Ear: External ear normal.     Left Ear: External ear normal.  Eyes:  Conjunctiva/sclera: Conjunctivae normal.     Pupils: Pupils are equal, round, and reactive to light.  Neck:     Trachea: No tracheal deviation.  Cardiovascular:     Rate and Rhythm: Normal rate and regular rhythm.     Heart sounds: Normal heart sounds. No murmur heard. Pulmonary:     Effort: Pulmonary effort is normal. No respiratory distress.     Breath sounds: Normal breath sounds. No wheezing.  Chest:     Chest wall: No tenderness.  Abdominal:     General: Bowel sounds are normal.     Palpations: Abdomen is soft. There is no mass.     Tenderness: There is no abdominal tenderness.  Musculoskeletal:        General: Normal range of motion.     Cervical back: Normal range of motion and neck supple. Tenderness (slight   TTP trapezius b/l) present.  Skin:    General: Skin is warm and dry.  Neurological:     Mental Status: He is alert and oriented to person, place, and time.    CMP Latest Ref Rng & Units 06/26/2020 11/29/2018 07/12/2017  Glucose 65 - 99 mg/dL 81 83 106(H)  BUN 8 - 27 mg/dL 19 18 11   Creatinine 0.76 - 1.27 mg/dL 0.95 0.99 0.97  Sodium 134 - 144 mmol/L 139 138 140  Potassium 3.5 - 5.2 mmol/L 3.6 3.8 3.8  Chloride 96 - 106 mmol/L 101 102 105  CO2 20 - 29 mmol/L - 22 24  Calcium 8.6 - 10.2 mg/dL 9.0 9.1 8.8(L)  Total Protein 6.0 - 8.5 g/dL 6.9 6.9 7.3  Total Bilirubin 0.0 - 1.2 mg/dL 1.9(H) 1.1 2.3(H)  Alkaline Phos 44 - 121 IU/L 170(H) 173(H) 137(H)  AST 0 - 40 IU/L 20 22 17   ALT 0 - 44 IU/L - 22 16(L)    Lipid Panel     Component Value Date/Time   CHOL 198 06/26/2020 0944   TRIG 69 06/26/2020 0944   HDL 38 (L) 06/26/2020 0944   CHOLHDL 5.2 (H) 06/26/2020 0944   CHOLHDL 6.7 (H) 12/05/2015 1109   VLDL 23 12/05/2015 1109   LDLCALC 147 (H) 06/26/2020 0944    CBC    Component Value Date/Time   WBC 6.6 06/26/2020 0944   WBC 7.3 09/13/2017 1425   WBC 7.6 07/12/2017 1443   RBC 6.61 (H) 06/26/2020 0944   RBC 6.12 (H) 09/13/2017 1425   HGB 17.8 (H) 06/26/2020 0944   HGB 16.5 03/13/2009 0931   HCT 54.5 (H) 06/26/2020 0944   HCT 49.8 03/13/2009 0931   PLT 181 06/26/2020 0944   MCV 83 06/26/2020 0944   MCV 82.2 03/13/2009 0931   MCH 26.9 06/26/2020 0944   MCH 27.8 09/13/2017 1425   MCHC 32.7 06/26/2020 0944   MCHC 33.3 09/13/2017 1425   RDW 16.3 (H) 06/26/2020 0944   RDW 15.5 (H) 03/13/2009 0931   LYMPHSABS 2.2 06/26/2020 0944   LYMPHSABS 2.2 03/13/2009 0931   MONOABS 0.6 09/13/2017 1425   MONOABS 0.5 03/13/2009 0931   EOSABS 0.0 06/26/2020 0944   BASOSABS 0.0 06/26/2020 0944   BASOSABS 0.0 03/13/2009 0931    Lab Results  Component Value Date   HGBA1C 5.4 (A) 11/29/2018    Assessment & Plan:  1. Annual physical exam Counseled on 150 minutes of exercise per week,  healthy eating (including decreased daily intake of saturated fats, cholesterol, added sugars, sodium), routine healthcare maintenance. - Vitamin B12 - VITAMIN D 25 Hydroxy (Vit-D Deficiency,  Fractures)  2. Screening for diabetes mellitus - Hemoglobin A1c  3. DDD (degenerative disc disease), thoracolumbar Uncontrolled Currently undergoing PT - meloxicam (MOBIC) 7.5 MG tablet; Take 1 tablet (7.5 mg total) by mouth daily.  Dispense: 90 tablet; Refill: 1  4. Other hyperlipidemia Controlled Low-cholesterol diet - atorvastatin (LIPITOR) 10 MG tablet; Take 1 tablet (10 mg total) by mouth daily.  Dispense: 90 tablet; Refill: 1  5. Vertigo Placed on meclizine  6. Essential hypertension Controlled Counseled on blood pressure goal of less than 130/80, low-sodium, DASH diet, medication compliance, 150 minutes of moderate intensity exercise per week. Discussed medication compliance, adverse effects. - amLODipine (NORVASC) 5 MG tablet; Take 1 tablet (5 mg total) by mouth daily. OFFICE VISIT NEEDED FOR ADDITIONAL REFILLS  Dispense: 60 tablet; Refill: 0   Meds ordered this encounter  Medications   meclizine (ANTIVERT) 25 MG tablet    Sig: Take 1 tablet (25 mg total) by mouth 2 (two) times daily as needed for dizziness.    Dispense:  60 tablet    Refill:  1   meloxicam (MOBIC) 7.5 MG tablet    Sig: Take 1 tablet (7.5 mg total) by mouth daily.    Dispense:  90 tablet    Refill:  1   amLODipine (NORVASC) 5 MG tablet    Sig: Take 1 tablet (5 mg total) by mouth daily. OFFICE VISIT NEEDED FOR ADDITIONAL REFILLS    Dispense:  60 tablet    Refill:  0   atorvastatin (LIPITOR) 10 MG tablet    Sig: Take 1 tablet (10 mg total) by mouth daily.    Dispense:  90 tablet    Refill:  1   baclofen (LIORESAL) 10 MG tablet    Sig: Take 0.5-1 tablets (5-10 mg total) by mouth at bedtime as needed for muscle spasms.    Dispense:  90 tablet    Refill:  1    Follow-up: Return in about 6 months (around  02/14/2021) for Medical conditions.       Charlott Rakes, MD, FAAFP. [facility] [place], [place]   08/14/2020, 5:19 PM

## 2020-08-14 NOTE — Patient Instructions (Signed)
Vertigo Vertigo is the feeling that you or the things around you are moving when they are not. This feeling can come and go at any time. Vertigo often goes away on its own. This condition can be dangerous if it happens when you are doingactivities like driving or working with machines. Your doctor will do tests to find the cause of your vertigo. These tests willalso help your doctor decide on the best treatment for you. Follow these instructions at home: Eating and drinking     Drink enough fluid to keep your pee (urine) pale yellow. Do not drink alcohol. Activity Return to your normal activities when your doctor says that it is safe. In the morning, first sit up on the side of the bed. When you feel okay, stand slowly while you hold onto something until you know that your balance is fine. Move slowly. Avoid sudden body or head movements or certain positions, as told by your doctor. Use a cane if you have trouble standing or walking. Sit down right away if you feel dizzy. Avoid doing any tasks or activities that can cause danger to you or others if you get dizzy. Avoid bending down if you feel dizzy. Place items in your home so that they are easy for you to reach without bending or leaning over. Do not drive or use machinery if you feel dizzy. General instructions Take over-the-counter and prescription medicines only as told by your doctor. Keep all follow-up visits. Contact a doctor if: Your medicine does not help your vertigo. Your problems get worse or you have new symptoms. You have a fever. You feel like you may vomit (nauseous), or this feeling gets worse. You start to vomit. Your family or friends see changes in how you act. You lose feeling (have numbness) in part of your body. You feel prickling and tingling in a part of your body. Get help right away if: You are always dizzy. You faint. You get very bad headaches. You get a stiff neck. Bright light starts to bother  you. You have trouble moving or talking. You feel weak in your hands, arms, or legs. You have changes in your hearing or in how you see (vision). These symptoms may be an emergency. Get help right away. Call your local emergency services (911 in the [place] Do not wait to see if the symptoms will go away. Do not drive yourself to the hospital. Summary Vertigo is the feeling that you or the things around you are moving when they are not. Your doctor will do tests to find the cause of your vertigo. You may be told to avoid some tasks, positions, or movements. Contact a doctor if your medicine is not helping, or if you have a fever, new symptoms, or a change in how you act. Get help right away if you get very bad headaches, or if you have changes in how you speak, hear, or see. This information is not intended to replace advice given to you by your health care provider. Make sure you discuss any questions you have with your healthcare provider. Document Revised: 12/27/2019 Document Reviewed: 12/27/2019 Elsevier Patient Education  2022 [employer]  

## 2020-08-14 NOTE — Therapy (Signed)
[place], [place], [address] Phone: [phone]   Fax:  [phone]  Physical Therapy Evaluation  Patient Details  Name: Aaron Dennis MRN: [id number] Date of Birth: 1956/01/29 Referring Provider (PT): Eunice Blase, MD   Encounter Date: 08/14/2020   PT End of Session - 08/14/20 1348     Visit Number 1    Number of Visits 17    Date for PT Re-Evaluation 10/09/20    Authorization Type CAFA    PT Start Time 1130    PT Stop Time 1210    PT Time Calculation (min) 40 min    Activity Tolerance Patient tolerated treatment well    Behavior During Therapy [facility] for tasks assessed/performed             Past Medical History:  Diagnosis Date   GERD (gastroesophageal reflux disease)    Hypertension    Lung nodule    7 mm LUL nodule    History reviewed. No pertinent surgical history.  Vitals:   08/14/20 1133  BP: 125/86      Subjective Assessment - 08/14/20 1133     Subjective Pt presents to PT with reports of chronic mid back pain and discomfort as neck pain. He also promotes dizziness sensation with head movement, especially cervical flexion. Pt had previous PT at [facility] location with no reported change in symptoms. Pt denies any referral of pain or paresthesias down UE/LEs. Also denies bowel/bladder changes, saddle anesthesia, or gait disturbances. 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    Limitations Lifting;Walking;House hold activities    Patient Stated Goals would like to decrease mid back pain and dizziness    Currently in Pain? Yes    Pain Score 0-No pain   7/10 at worst in last week   Pain Location Back    Pain Orientation Mid    Pain Type Chronic pain    Pain Onset More than a month ago    Pain Frequency Several days a week    Aggravating Factors  laying on R side, R rotation of mid back    Pain Relieving Factors Meloxicam                 [facility] PT Assessment - 08/14/20 0001       Assessment   Medical Diagnosis M54.6,G89.29 (ICD-10-CM) - Chronic bilateral thoracic back pain  R42 (ICD-10-CM) - Vertigo    Referring Provider (PT) Hilts, Michael, MD    Hand Dominance Right      Precautions   Precautions None      Restrictions   Weight Bearing Restrictions No      Balance Screen   Has the patient fallen in the past 6 months No    Has the patient had a decrease in activity level because of a fear of falling?  No    Is the patient reluctant to leave their home because of a fear of falling?  No      Home Environment   Living Environment Private residence    Living Arrangements Spouse/significant other;Children    Type of [place] Access Level entry    [place] One level      Prior Function   Level of Independence Independent;Independent with basic ADLs      Observation/Other Assessments   Focus on Therapeutic Outcomes (FOTO)  Not set up  Sensation   Light Touch Appears Intact      Posture/Postural Control   Posture Comments increased thoracic kyphosis and fwd head posture      AROM   Overall AROM Comments UE AROM WNL      Strength   Overall Strength Comments UE strength WNL      Palpation   Spinal mobility hypomobile segements C7-T6    Palpation comment TTP to L upper trapezius and levator, L>R      Special Tests   Other special tests Dix-Halpike negative for dizziness or nystagmus; Gaze Stabilization and VOR cancellation negative                        Objective measurements completed on examination: See above findings.       [facility] Adult PT Treatment/Exercise - 08/14/20 0001       Lumbar Exercises: Quadruped   Madcat/Old Horse 10 reps      Shoulder Exercises: Seated   Row 10 reps;Theraband    Theraband Level (Shoulder Row) Level 2 (Red)      Manual Therapy   Manual therapy comments suboccibital release, positional release to bil  upper traps - reduced pain      Neck Exercises: Stretches   Upper Trapezius Stretch 30 seconds;Left    Levator Stretch 30 seconds;Left    Other Neck Stretches seated thoracic ext over towel roll x 15                    PT Education - 08/14/20 1348     Education Details eval findings, HEP, POC    Person(s) Educated Patient    Methods Explanation;Demonstration;Handout    Comprehension Verbalized understanding;Returned demonstration              PT Short Term Goals - 08/14/20 1355       PT SHORT TERM GOAL #1   Title Pt will be I and compliant with initial HEP.    Baseline initial HEP given    Time 3    Period Weeks    Status New    Target Date 09/04/20      PT SHORT TERM GOAL #2   Title Pt will self report neck and mid back pain no greater than 5/10 at worst in order to improve comfort and functional ability    Baseline 7/10 at worst    Time 3    Period Weeks    Status New    Target Date 09/04/20               PT Long Term Goals - 08/14/20 1356       PT LONG TERM GOAL #1   Title Pt will self report neck and mid back pain no greater than 3/10 at worst in order to improve comfort and functional ability    Baseline 7/10 at worst    Time 8    Period Weeks    Status New    Target Date 10/09/20      PT LONG TERM GOAL #2   Title Pt will be able to reach overhead into cabinets with no increase in neck/mid back pain in order to improve functional ability to perform ADLs    Baseline unable without inc pain    Time 8    Period Weeks    Status New    Target Date 10/09/20      PT LONG TERM GOAL #3   Title Pt will  be compliant with and understand progressions for advanced HEP for long term maintenance.    Time 8    Period Weeks    Status New    Target Date 10/09/20                    Plan - 08/14/20 1400     Clinical Impression Statement Pt presents to PT with reports of chronic neck and mid back pain and discomfort. Physical findings  are consistent with MD impression, as pt demonstrates postural deficits and thoracic hypomobility to spring testing. While he was negative for vestibular testing through gaze stabilization, VOR cancellation, and Dix-Halpike, currently can not rule out the possibility of cervicogenic dizziness secondary to tightened postural and posterior cervical musculature. Pt would benefit from skilled PT services working on improving periscapular strength, thoracic mobility, and manual for decreasing neck pain/tightness.    Personal Factors and Comorbidities Comorbidity 1;Time since onset of injury/illness/exacerbation;Fitness;Profession    Comorbidities HTN    Examination-Activity Limitations Lift;Carry;Stand;Reach Overhead    Examination-Participation Restrictions Driving;Community Activity;Shop    Stability/Clinical Decision Making Stable/Uncomplicated    Clinical Decision Making Low    Rehab Potential Good    PT Frequency 2x / week    PT Duration 8 weeks    PT Treatment/Interventions ADLs/Self Care Home Management;Cryotherapy;Electrical Stimulation;Moist Heat;Gait training;Stair training;Functional mobility training;Therapeutic activities;Therapeutic exercise;Balance training;Neuromuscular re-education;Patient/family education;Manual techniques;Passive range of motion;Dry needling;Taping;Vestibular;Spinal Manipulations;Joint Manipulations    PT Next Visit Plan assess cervical AROM; assess response to HEP; manual for decreasing pain (PA mobs, suboccipital release); progress periscapular strenthening and thoracic mobility as able    PT Home Exercise Plan Access Code: [email or web address]    Consulted and Agree with Plan of Care Patient             Patient will benefit from skilled therapeutic intervention in order to improve the following deficits and impairments:  Decreased activity tolerance, Hypomobility, Decreased mobility, Decreased endurance, Decreased range of motion, Postural dysfunction, Pain, Impaired  flexibility, Increased fascial restricitons, Decreased strength, Impaired UE functional use  Visit Diagnosis: Pain in thoracic spine - Plan: PT plan of care cert/re-cert  Cervicalgia - Plan: PT plan of care cert/re-cert  Abnormal posture - Plan: PT plan of care cert/re-cert     Problem List Patient Active Problem List   Diagnosis Date Noted   Primary osteoarthritis of left knee 01/13/2019   Primary osteoarthritis of right knee 01/13/2019   Flat foot 11/28/2017   Tendonitis of foot 11/28/2017   Polycythemia 05/12/2017   Vitamin D deficiency 04/21/2017   Bony sclerosis 08/05/2016   Lung nodule    Chronic pain of both knees 12/05/2015   Gastroesophageal reflux disease without esophagitis 05/17/2014   Essential hypertension 05/17/2014   Coronary artery calcification 08/17/2012   Abnormal LFTs (liver function tests) 07/11/2012   Chronic abdominal pain 06/24/2012   Right-sided chest wall pain 06/24/2012   Constipation, chronic 06/24/2012   Hyperbilirubinemia 06/24/2012   Chronic back pain 06/24/2012   DDD (degenerative disc disease), thoracolumbar 06/24/2012   History of nephrolithiasis 06/24/2012   Side pain 06/24/2012   Low back pain at multiple sites 06/24/2012    Ward Chatters, PT, DPT 08/14/20 2:12 PM  [facility] [address] [place], [place], [address] Phone: [phone]   Fax:  [phone]  Name: Aaron Dennis MRN: [id number] Date of Birth: 06-Apr-1955

## 2020-08-15 ENCOUNTER — Telehealth: Payer: Self-pay

## 2020-08-15 ENCOUNTER — Other Ambulatory Visit: Payer: Self-pay

## 2020-08-15 ENCOUNTER — Other Ambulatory Visit: Payer: Self-pay | Admitting: Family Medicine

## 2020-08-15 LAB — VITAMIN D 25 HYDROXY (VIT D DEFICIENCY, FRACTURES): Vit D, 25-Hydroxy: 7.8 ng/mL — ABNORMAL LOW (ref 30.0–100.0)

## 2020-08-15 LAB — HEMOGLOBIN A1C
Est. average glucose Bld gHb Est-mCnc: 105 mg/dL
Hgb A1c MFr Bld: 5.3 % (ref 4.8–5.6)

## 2020-08-15 LAB — VITAMIN B12: Vitamin B-12: 173 pg/mL — ABNORMAL LOW (ref 232–1245)

## 2020-08-15 MED ORDER — ERGOCALCIFEROL 1.25 MG (50000 UT) PO CAPS
50000.0000 [IU] | ORAL_CAPSULE | ORAL | 1 refills | Status: DC
  Filled 2020-08-15: qty 4, 28d supply, fill #0
  Filled 2020-09-30: qty 4, 28d supply, fill #1

## 2020-08-15 MED ORDER — VITAMIN B-12 1000 MCG PO TABS
1000.0000 ug | ORAL_TABLET | Freq: Every day | ORAL | 1 refills | Status: AC
  Filled 2020-08-15: qty 90, 90d supply, fill #0

## 2020-08-15 NOTE — Telephone Encounter (Signed)
Patient name and DOB has been verified Patient was informed of lab results. Patient had no questions.  

## 2020-08-15 NOTE — Telephone Encounter (Signed)
-----   Message from Charlott Rakes, MD sent at 08/15/2020 10:48 AM EDT ----- Please inform him that his vitamin B12 is low and vitamin D is also low.  I have sent a prescription for vitamin B12 to his pharmacy and also a weekly dose of vitamin D to his pharmacy.  I would like him to discontinue the current daily dose of vitamin D prescribed by orthopedics and start the weekly dose I have sent to his pharmacy.  A1c is 5.3 which is negative for diabetes.  Thanks

## 2020-08-16 ENCOUNTER — Other Ambulatory Visit: Payer: Self-pay

## 2020-08-16 ENCOUNTER — Encounter: Payer: Self-pay | Admitting: Physical Therapy

## 2020-08-16 ENCOUNTER — Ambulatory Visit: Payer: Medicare Other | Admitting: Physical Therapy

## 2020-08-16 DIAGNOSIS — M546 Pain in thoracic spine: Secondary | ICD-10-CM

## 2020-08-16 DIAGNOSIS — R293 Abnormal posture: Secondary | ICD-10-CM

## 2020-08-16 DIAGNOSIS — M542 Cervicalgia: Secondary | ICD-10-CM

## 2020-08-16 NOTE — Therapy (Signed)
[place], [place], [address] Phone: [phone]   Fax:  [phone]  Physical Therapy Treatment  Patient Details  Name: Aaron Dennis MRN: [id number] Date of Birth: [birth year] Referring Provider (PT): Eunice Blase, MD   Encounter Date: 08/16/2020   PT End of Session - 08/16/20 1200     Visit Number 2    Number of Visits 17    Date for PT Re-Evaluation 10/09/20    Authorization Type CAFA    PT Start Time 1137   patient arrived late   PT Stop Time 1215    PT Time Calculation (min) 38 min    Activity Tolerance Patient tolerated treatment well    Behavior During Therapy [facility] for tasks assessed/performed             Past Medical History:  Diagnosis Date   GERD (gastroesophageal reflux disease)    Hypertension    Lung nodule    7 mm LUL nodule    History reviewed. No pertinent surgical history.  There were no vitals filed for this visit.   Subjective Assessment - 08/16/20 1143     Subjective Patient reports he was out of town so did not do any exercises. No change since last visit.    Patient Stated Goals would like to decrease mid back pain and dizziness    Currently in Pain? Yes    Pain Score 7     Pain Location Neck    Pain Orientation Mid;Lower    Pain Descriptors / Indicators Aching    Pain Type Chronic pain    Pain Onset More than a month ago    Pain Frequency Constant                               [facility] Adult PT Treatment/Exercise - 08/16/20 0001       Exercises   Exercises Neck      Neck Exercises: Machines for Strengthening   UBE (Upper Arm Bike) L2 x 4 min (2 fwd/bwd)      Neck Exercises: Theraband   Rows 10 reps;Green   2 sets   Horizontal ABduction 10 reps;Red   2 sets     Neck Exercises: Supine   Neck Retraction 10 reps;5 secs    Neck Retraction Limitations 2 pillows under head      Neck Exercises: Stretches   Upper Trapezius Stretch 2  reps;20 seconds    Levator Stretch 2 reps;20 seconds    Other Neck Stretches Seated thoracic extension over back of chair with hands clasped behind head 10 x 5 sec    Other Neck Stretches Sidelying thoracic rotation 10 x 5 sec each                    PT Education - 08/16/20 1159     Education Details HEP update    Person(s) Educated Patient    Methods Explanation;Demonstration;Verbal cues;Tactile cues;Handout    Comprehension Verbalized understanding;Returned demonstration;Verbal cues required;Tactile cues required;Need further instruction              PT Short Term Goals - 08/14/20 1355       PT SHORT TERM GOAL #1   Title Pt will be I and compliant with initial HEP.    Baseline initial HEP given    Time 3    Period Weeks    Status New  Target Date 09/04/20      PT SHORT TERM GOAL #2   Title Pt will self report neck and mid back pain no greater than 5/10 at worst in order to improve comfort and functional ability    Baseline 7/10 at worst    Time 3    Period Weeks    Status New    Target Date 09/04/20               PT Long Term Goals - 08/14/20 1356       PT LONG TERM GOAL #1   Title Pt will self report neck and mid back pain no greater than 3/10 at worst in order to improve comfort and functional ability    Baseline 7/10 at worst    Time 8    Period Weeks    Status New    Target Date 10/09/20      PT LONG TERM GOAL #2   Title Pt will be able to reach overhead into cabinets with no increase in neck/mid back pain in order to improve functional ability to perform ADLs    Baseline unable without inc pain    Time 8    Period Weeks    Status New    Target Date 10/09/20      PT LONG TERM GOAL #3   Title Pt will be compliant with and understand progressions for advanced HEP for long term maintenance.    Time 8    Period Weeks    Status New    Target Date 10/09/20                   Plan - 08/16/20 1203     Clinical Impression  Statement Patient tolerated therapy well with no adverse effects. Patient arrived late so therapy limited with time. Therapy focused on improve thoracic and cervical mobility and progressing postural strengthening. Patient did not report any increase in pain this visit. He continues to demonstrate postural deficits with forward head, rounded shoulders, and increased thoracic kyphosis. Updated HEP to include thoracic and cervical mobility exercises. Patient would benefit from continued skilled PT to progress mobility and strength in order to decrease pain and maximize functional ability.    PT Treatment/Interventions ADLs/Self Care Home Management;Cryotherapy;Electrical Stimulation;Moist Heat;Gait training;Stair training;Functional mobility training;Therapeutic activities;Therapeutic exercise;Balance training;Neuromuscular re-education;Patient/family education;Manual techniques;Passive range of motion;Dry needling;Taping;Vestibular;Spinal Manipulations;Joint Manipulations    PT Next Visit Plan assess cervical AROM; assess response to HEP; manual for decreasing pain (PA mobs, suboccipital release); progress periscapular strenthening and thoracic mobility as able    PT Home Exercise Plan Access Code: [email or web address]    Consulted and Agree with Plan of Care Patient             Patient will benefit from skilled therapeutic intervention in order to improve the following deficits and impairments:  Decreased activity tolerance, Hypomobility, Decreased mobility, Decreased endurance, Decreased range of motion, Postural dysfunction, Pain, Impaired flexibility, Increased fascial restricitons, Decreased strength, Impaired UE functional use  Visit Diagnosis: Pain in thoracic spine  Cervicalgia  Abnormal posture     Problem List Patient Active Problem List   Diagnosis Date Noted   Primary osteoarthritis of left knee 01/13/2019   Primary osteoarthritis of right knee 01/13/2019   Flat foot 11/28/2017    Tendonitis of foot 11/28/2017   Polycythemia 05/12/2017   Vitamin D deficiency 04/21/2017   Bony sclerosis 08/05/2016   Lung nodule    Chronic pain of both knees 12/05/2015  Gastroesophageal reflux disease without esophagitis 05/17/2014   Essential hypertension 05/17/2014   Coronary artery calcification 08/17/2012   Abnormal LFTs (liver function tests) 07/11/2012   Chronic abdominal pain 06/24/2012   Right-sided chest wall pain 06/24/2012   Constipation, chronic 06/24/2012   Hyperbilirubinemia 06/24/2012   Chronic back pain 06/24/2012   DDD (degenerative disc disease), thoracolumbar 06/24/2012   History of nephrolithiasis 06/24/2012   Side pain 06/24/2012   Low back pain at multiple sites 06/24/2012    Hilda Blades, PT, DPT, LAT, ATC 08/16/20  12:16 PM Phone: [phone] Fax: [facility] [address] [place], [place], [address] Phone: [phone]   Fax:  [phone]  Name: Aaron Dennis MRN: [id number] Date of Birth: [birth year]

## 2020-08-16 NOTE — Patient Instructions (Signed)
Access Code: [email or web address] URL: [email or web address] Date: 08/16/2020 Prepared by: Hilda Blades  Exercises Cat Cow - 1 x daily - 7 x weekly - 2 sets - 10 reps Standing Shoulder Row with Anchored Resistance - 1 x daily - 7 x weekly - 3 sets - 10 reps Seated Upper Trapezius Stretch - 2 x daily - 7 x weekly - 1 sets - 2 reps - 30 sec hold Seated Levator Scapulae Stretch - 2 x daily - 7 x weekly - 1 sets - 2 reps - 30 sec hold Seated Thoracic Lumbar Extension - 1 x daily - 7 x weekly - 2 sets - 15 reps - 5 hold Sidelying Thoracic Lumbar Rotation - 1 x daily - 7 x weekly - 2 sets - 10 reps - 5 hold Supine Cervical Retraction with Towel - 1 x daily - 7 x weekly - 2 sets - 10 reps

## 2020-08-19 ENCOUNTER — Other Ambulatory Visit: Payer: Self-pay

## 2020-08-19 ENCOUNTER — Ambulatory Visit: Payer: Medicare Other

## 2020-08-19 DIAGNOSIS — M546 Pain in thoracic spine: Secondary | ICD-10-CM | POA: Diagnosis not present

## 2020-08-19 DIAGNOSIS — R293 Abnormal posture: Secondary | ICD-10-CM

## 2020-08-19 DIAGNOSIS — M542 Cervicalgia: Secondary | ICD-10-CM

## 2020-08-19 NOTE — Therapy (Signed)
[place], [place], [address] Phone: [phone]   Fax:  [phone]  Physical Therapy Treatment  Patient Details  Name: Aaron Dennis MRN: [id number] Date of Birth: August 27, 1955 Referring Provider (PT): Eunice Blase, MD   Encounter Date: 08/19/2020   PT End of Session - 08/19/20 1130     Visit Number 3    Number of Visits 17    Date for PT Re-Evaluation 10/09/20    Authorization Type CAFA    PT Start Time 1132    PT Stop Time 1214    PT Time Calculation (min) 42 min    Activity Tolerance Patient tolerated treatment well    Behavior During Therapy [facility] for tasks assessed/performed             Past Medical History:  Diagnosis Date   GERD (gastroesophageal reflux disease)    Hypertension    Lung nodule    7 mm LUL nodule    No past surgical history on file.  There were no vitals filed for this visit.   Subjective Assessment - 08/19/20 1131     Subjective Pt presents to PT with no current reports of pain. He does note continued pain in R mid back when laying on R side. Pt has been compliant with HEP with no adverse effect. He is ready to begin PT treatment at this time.    Currently in Pain? No/denies    Pain Score 0-No pain                               [facility] Adult PT Treatment/Exercise - 08/19/20 0001       Neck Exercises: Machines for Strengthening   UBE (Upper Arm Bike) L2 x 4 min (2 fwd/bwd)      Neck Exercises: Theraband   Rows Limitations 2x12 green tband    Horizontal ABduction 15 reps;Red    Horizontal ABduction Limitations x 2      Neck Exercises: Standing   Other Standing Exercises standing chin tuck into small exercise ball x 10 - 3 sec hold      Neck Exercises: Seated   Other Seated Exercise seated thoracic ext over soft foam x 15 - 3 sec hold      Lumbar Exercises: Quadruped   Madcat/Old Horse 5 reps      Manual Therapy   Manual Therapy Joint  mobilization    Manual therapy comments suboccibital release, positional release to bil upper traps    Joint Mobilization grade III PA mobs to C7-T4                      PT Short Term Goals - 08/14/20 1355       PT SHORT TERM GOAL #1   Title Pt will be I and compliant with initial HEP.    Baseline initial HEP given    Time 3    Period Weeks    Status New    Target Date 09/04/20      PT SHORT TERM GOAL #2   Title Pt will self report neck and mid back pain no greater than 5/10 at worst in order to improve comfort and functional ability    Baseline 7/10 at worst    Time 3    Period Weeks    Status New    Target Date 09/04/20  PT Long Term Goals - 08/14/20 1356       PT LONG TERM GOAL #1   Title Pt will self report neck and mid back pain no greater than 3/10 at worst in order to improve comfort and functional ability    Baseline 7/10 at worst    Time 8    Period Weeks    Status New    Target Date 10/09/20      PT LONG TERM GOAL #2   Title Pt will be able to reach overhead into cabinets with no increase in neck/mid back pain in order to improve functional ability to perform ADLs    Baseline unable without inc pain    Time 8    Period Weeks    Status New    Target Date 10/09/20      PT LONG TERM GOAL #3   Title Pt will be compliant with and understand progressions for advanced HEP for long term maintenance.    Time 8    Period Weeks    Status New    Target Date 10/09/20                   Plan - 08/19/20 1151     Clinical Impression Statement Pt was once again able to complete prescribed exercise swith no adverse. He responded well to thoracic PAs for improving mid back mobility, with continued hypomobility noted by PT. Pt continues to benefit from skilled PT services and will continue to be seen and progressed as tolerated.    PT Treatment/Interventions ADLs/Self Care Home Management;Cryotherapy;Electrical Stimulation;Moist  Heat;Gait training;Stair training;Functional mobility training;Therapeutic activities;Therapeutic exercise;Balance training;Neuromuscular re-education;Patient/family education;Manual techniques;Passive range of motion;Dry needling;Taping;Vestibular;Spinal Manipulations;Joint Manipulations    PT Next Visit Plan manual for decreasing pain (PA mobs, suboccipital release); progress periscapular strenthening and thoracic mobility as able    PT Home Exercise Plan Access Code: [email or web address]             Patient will benefit from skilled therapeutic intervention in order to improve the following deficits and impairments:  Decreased activity tolerance, Hypomobility, Decreased mobility, Decreased endurance, Decreased range of motion, Postural dysfunction, Pain, Impaired flexibility, Increased fascial restricitons, Decreased strength, Impaired UE functional use  Visit Diagnosis: Pain in thoracic spine  Cervicalgia  Abnormal posture     Problem List Patient Active Problem List   Diagnosis Date Noted   Primary osteoarthritis of left knee 01/13/2019   Primary osteoarthritis of right knee 01/13/2019   Flat foot 11/28/2017   Tendonitis of foot 11/28/2017   Polycythemia 05/12/2017   Vitamin D deficiency 04/21/2017   Bony sclerosis 08/05/2016   Lung nodule    Chronic pain of both knees 12/05/2015   Gastroesophageal reflux disease without esophagitis 05/17/2014   Essential hypertension 05/17/2014   Coronary artery calcification 08/17/2012   Abnormal LFTs (liver function tests) 07/11/2012   Chronic abdominal pain 06/24/2012   Right-sided chest wall pain 06/24/2012   Constipation, chronic 06/24/2012   Hyperbilirubinemia 06/24/2012   Chronic back pain 06/24/2012   DDD (degenerative disc disease), thoracolumbar 06/24/2012   History of nephrolithiasis 06/24/2012   Side pain 06/24/2012   Low back pain at multiple sites 06/24/2012    Ward Chatters, PT, DPT 08/19/20 1:41 PM  [facility] [address] [place], [place], [address] Phone: [phone]   Fax:  [phone]  Name: Aaron Dennis MRN: [id number] Date of Birth: 1955-03-24

## 2020-08-21 ENCOUNTER — Encounter: Payer: Self-pay | Admitting: Physical Therapy

## 2020-08-21 ENCOUNTER — Other Ambulatory Visit: Payer: Self-pay

## 2020-08-21 ENCOUNTER — Ambulatory Visit: Payer: Medicare Other | Admitting: Physical Therapy

## 2020-08-21 DIAGNOSIS — M546 Pain in thoracic spine: Secondary | ICD-10-CM | POA: Diagnosis not present

## 2020-08-21 DIAGNOSIS — R252 Cramp and spasm: Secondary | ICD-10-CM

## 2020-08-21 DIAGNOSIS — M542 Cervicalgia: Secondary | ICD-10-CM

## 2020-08-21 DIAGNOSIS — G8929 Other chronic pain: Secondary | ICD-10-CM

## 2020-08-21 DIAGNOSIS — R293 Abnormal posture: Secondary | ICD-10-CM

## 2020-08-21 NOTE — Therapy (Signed)
[place], [place], [address] Phone: [phone]   Fax:  [phone]  Physical Therapy Treatment  Patient Details  Name: Aaron Dennis MRN: [id number] Date of Birth: [birth year] Referring Provider (PT): Eunice Blase, MD   Encounter Date: 08/21/2020   PT End of Session - 08/21/20 1047     Visit Number 4    Number of Visits 17    Date for PT Re-Evaluation 10/09/20    Authorization Type CAFA    PT Start Time 1047    PT Stop Time 1129    PT Time Calculation (min) 42 min    Activity Tolerance Patient tolerated treatment well    Behavior During Therapy [facility] for tasks assessed/performed             Past Medical History:  Diagnosis Date   GERD (gastroesophageal reflux disease)    Hypertension    Lung nodule    7 mm LUL nodule    History reviewed. No pertinent surgical history.  There were no vitals filed for this visit.   Subjective Assessment - 08/21/20 1047     Subjective Patient reports some dizziness with cervical flexing and pain in mid back. Patient reports he also has R shoulder pain.  Patient reports good compliance with HEP.    Limitations Lifting;Walking;House hold activities    Patient Stated Goals would like to decrease mid back pain and dizziness    Pain Score 0-No pain   Increased with head flex/ext.               [facility] PT Assessment - 08/21/20 0001       Assessment   Medical Diagnosis M54.6,G89.29 (ICD-10-CM) - Chronic bilateral thoracic back pain  R42 (ICD-10-CM) - Vertigo    Referring Provider (PT) Hilts, Michael, MD      Precautions   Precautions None      Restrictions   Weight Bearing Restrictions No                           [facility] Adult PT Treatment/Exercise - 08/21/20 0001       Neck Exercises: Sidelying   Other Sidelying Exercise Open Book R/L x10ea      Neck Exercises: Prone   Neck Retraction 15 reps    Neck Retraction Limitations  From POE    Other Prone Exercise POEx61min progressing to Press ups x15      Manual Therapy   Manual Therapy Joint mobilization;Soft tissue mobilization    Manual therapy comments suboccibital release, positional release to bil upper traps    Joint Mobilization grade III PA mobs to C7-L5    Soft tissue mobilization STM B paraspinals CTL and B periscap, B UTs with levator stretch      Neck Exercises: Stretches   Levator Stretch 20 seconds   R/L required tactile cues until levator stretched                   PT Education - 08/21/20 1134     Education Details Additions to HEP for prone exercises 6x/day.    Person(s) Educated Patient    Methods Explanation;Demonstration;Verbal cues;Handout    Comprehension Verbalized understanding;Returned demonstration              PT Short Term Goals - 08/14/20 1355       PT SHORT TERM GOAL #1   Title Pt will be I and compliant with initial HEP.  Baseline initial HEP given    Time 3    Period Weeks    Status New    Target Date 09/04/20      PT SHORT TERM GOAL #2   Title Pt will self report neck and mid back pain no greater than 5/10 at worst in order to improve comfort and functional ability    Baseline 7/10 at worst    Time 3    Period Weeks    Status New    Target Date 09/04/20               PT Long Term Goals - 08/14/20 1356       PT LONG TERM GOAL #1   Title Pt will self report neck and mid back pain no greater than 3/10 at worst in order to improve comfort and functional ability    Baseline 7/10 at worst    Time 8    Period Weeks    Status New    Target Date 10/09/20      PT LONG TERM GOAL #2   Title Pt will be able to reach overhead into cabinets with no increase in neck/mid back pain in order to improve functional ability to perform ADLs    Baseline unable without inc pain    Time 8    Period Weeks    Status New    Target Date 10/09/20      PT LONG TERM GOAL #3   Title Pt will be compliant with  and understand progressions for advanced HEP for long term maintenance.    Time 8    Period Weeks    Status New    Target Date 10/09/20                   Plan - 08/21/20 1136     Clinical Impression Statement Patient reponded well to CTL PA glides and soft tissue mobility B UTs, perispinals and periscap. Also incorporated prone progression of static POE, prone cervical retraction and pressups.  Patient reponded well with noted decrease in mid back pain and dizziness with seated cervical flex/ext at end of session.  Additions of these exercises given to patient to perform 6x/day to improve vert mobility along with focus on daily open book and levator stretches.  Patient will benefit from continued skilled PT to address deficits and maximize daily tasks with decreased pain.    Comorbidities HTN    Examination-Activity Limitations Lift;Carry;Stand;Reach Overhead    Examination-Participation Restrictions Driving;Community Activity;Shop    PT Treatment/Interventions ADLs/Self Care Home Management;Cryotherapy;Electrical Stimulation;Moist Heat;Gait training;Stair training;Functional mobility training;Therapeutic activities;Therapeutic exercise;Balance training;Neuromuscular re-education;Patient/family education;Manual techniques;Passive range of motion;Dry needling;Taping;Vestibular;Spinal Manipulations;Joint Manipulations    PT Next Visit Plan Assess addition of prone exercises, manual for decreasing pain (PA mobs, suboccipital release); progress periscapular strenthening and cervical/thoracic mobility as able.    PT Home Exercise Plan Access Code: [email or web address]    Consulted and Agree with Plan of Care Patient             Patient will benefit from skilled therapeutic intervention in order to improve the following deficits and impairments:  Decreased activity tolerance, Hypomobility, Decreased mobility, Decreased endurance, Decreased range of motion, Postural dysfunction, Pain, Impaired  flexibility, Increased fascial restricitons, Decreased strength, Impaired UE functional use  Visit Diagnosis: Pain in thoracic spine  Cervicalgia  Abnormal posture  Chronic bilateral low back pain with right-sided sciatica  Cramp and spasm  Chronic bilateral low back pain without sciatica  Problem List Patient Active Problem List   Diagnosis Date Noted   Primary osteoarthritis of left knee 01/13/2019   Primary osteoarthritis of right knee 01/13/2019   Flat foot 11/28/2017   Tendonitis of foot 11/28/2017   Polycythemia 05/12/2017   Vitamin D deficiency 04/21/2017   Bony sclerosis 08/05/2016   Lung nodule    Chronic pain of both knees 12/05/2015   Gastroesophageal reflux disease without esophagitis 05/17/2014   Essential hypertension 05/17/2014   Coronary artery calcification 08/17/2012   Abnormal LFTs (liver function tests) 07/11/2012   Chronic abdominal pain 06/24/2012   Right-sided chest wall pain 06/24/2012   Constipation, chronic 06/24/2012   Hyperbilirubinemia 06/24/2012   Chronic back pain 06/24/2012   DDD (degenerative disc disease), thoracolumbar 06/24/2012   History of nephrolithiasis 06/24/2012   Side pain 06/24/2012   Low back pain at multiple sites 06/24/2012    Pollyann Samples, PT 08/21/2020, 11:44 AM  [facility] [address] [place], [place], [address] Phone: [phone]   Fax:  [phone]  Name: Aaron Dennis MRN: [id number] Date of Birth: 12-Apr-1955

## 2020-08-29 ENCOUNTER — Other Ambulatory Visit: Payer: Self-pay

## 2020-08-29 ENCOUNTER — Ambulatory Visit: Payer: Medicare Other

## 2020-08-29 DIAGNOSIS — R252 Cramp and spasm: Secondary | ICD-10-CM

## 2020-08-29 DIAGNOSIS — M5441 Lumbago with sciatica, right side: Secondary | ICD-10-CM

## 2020-08-29 DIAGNOSIS — M546 Pain in thoracic spine: Secondary | ICD-10-CM | POA: Diagnosis not present

## 2020-08-29 DIAGNOSIS — R293 Abnormal posture: Secondary | ICD-10-CM

## 2020-08-29 DIAGNOSIS — G8929 Other chronic pain: Secondary | ICD-10-CM

## 2020-08-29 DIAGNOSIS — M542 Cervicalgia: Secondary | ICD-10-CM

## 2020-08-29 NOTE — Therapy (Signed)
[place], [place], [address] Phone: [phone]   Fax:  [phone]  Physical Therapy Treatment  Patient Details  Name: Aaron Dennis MRN: [id number] Date of Birth: [birth year] Referring Provider (PT): Eunice Blase, MD   Encounter Date: 08/29/2020   PT End of Session - 08/29/20 1222     Visit Number 5    Number of Visits 17    Date for PT Re-Evaluation 10/09/20    Authorization Type CAFA    PT Start Time 1147    PT Stop Time 1231    PT Time Calculation (min) 44 min    Activity Tolerance Patient tolerated treatment well    Behavior During Therapy [facility] for tasks assessed/performed             Past Medical History:  Diagnosis Date   GERD (gastroesophageal reflux disease)    Hypertension    Lung nodule    7 mm LUL nodule    History reviewed. No pertinent surgical history.  There were no vitals filed for this visit.   Subjective Assessment - 08/29/20 1150     Subjective Pt reports his neck and back are better a "little bit". "i feel better after PT. Pt reports pain of his R lateral, distal ribs.    Patient Stated Goals would like to decrease mid back pain and dizziness    Currently in Pain? No/denies    Pain Score 0-No pain    Pain Location Neck    Pain Orientation Posterior;Mid;Lower    Pain Descriptors / Indicators Aching    Pain Type Chronic pain    Pain Onset More than a month ago    Pain Frequency Constant                               [facility] Adult PT Treatment/Exercise - 08/29/20 1309       Exercises   Exercises Neck;Lumbar      Neck Exercises: Machines for Strengthening   UBE (Upper Arm Bike) L2 x 4 min (2 fwd/bwd)      Neck Exercises: Theraband   Rows Limitations 2x12 green tband      Neck Exercises: Standing   Neck Retraction 10 reps   3 sec   Neck Retraction Limitations into a small exercise ball      Neck Exercises: Sidelying   Other Sidelying  Exercise Open Book R/L x10ea      Neck Exercises: Prone   Other Prone Exercise POEx58min progressing to Press ups x15      Lumbar Exercises: Stretches   Standing Side Bend 4 reps;Right;10 seconds    Standing Side Bend Limitations QL stretch      Lumbar Exercises: Quadruped   [place] Horse 10 reps      Manual Therapy   Manual Therapy Joint mobilization;Soft tissue mobilization    Manual therapy comments STM to the T9-T12 lateral intercostal tissue    Joint Mobilization grade III UPA and PA mobs to C6-T12                    PT Education - 08/29/20 1324     Education Details HEP update    Person(s) Educated Patient    Methods Explanation;Demonstration;Tactile cues;Verbal cues;Handout    Comprehension Verbalized understanding;Returned demonstration;Verbal cues required;Tactile cues required              PT Short Term Goals - 08/14/20  [place] #1   Title Pt will be I and compliant with initial HEP.    Baseline initial HEP given    Time 3    Period Weeks    Status New    Target Date 09/04/20      PT SHORT TERM GOAL #2   Title Pt will self report neck and mid back pain no greater than 5/10 at worst in order to improve comfort and functional ability    Baseline 7/10 at worst    Time 3    Period Weeks    Status New    Target Date 09/04/20               PT Long Term Goals - 08/14/20 1356       PT LONG TERM GOAL #1   Title Pt will self report neck and mid back pain no greater than 3/10 at worst in order to improve comfort and functional ability    Baseline 7/10 at worst    Time 8    Period Weeks    Status New    Target Date 10/09/20      PT LONG TERM GOAL #2   Title Pt will be able to reach overhead into cabinets with no increase in neck/mid back pain in order to improve functional ability to perform ADLs    Baseline unable without inc pain    Time 8    Period Weeks    Status New    Target Date 10/09/20      PT LONG  TERM GOAL #3   Title Pt will be compliant with and understand progressions for advanced HEP for long term maintenance.    Time 8    Period Weeks    Status New    Target Date 10/09/20                   Plan - 08/29/20 1326     Clinical Impression Statement PT was continued to address pt's postural dysfunction of forward head and increased thoracic kyphosis and associated pain througth ROM and strengthening exs and UPA and PA mobs to the lower cervical and thoracic vertebrae. Additionally, STM to the R lower lateral intercostal tissue was provided to address pain. Pt tolerated the session without adverse effects. Pt remarked he does not immediately feel better after PT, but does so later that day.    Personal Factors and Comorbidities Comorbidity 1;Time since onset of injury/illness/exacerbation;Fitness;Profession    Comorbidities HTN    Examination-Activity Limitations Lift;Carry;Stand;Reach Overhead    Examination-Participation Restrictions Driving;Community Activity;Shop    Stability/Clinical Decision Making Stable/Uncomplicated    Clinical Decision Making Low    Rehab Potential Good    PT Frequency 2x / week    PT Duration 8 weeks    PT Treatment/Interventions ADLs/Self Care Home Management;Cryotherapy;Electrical Stimulation;Moist Heat;Gait training;Stair training;Functional mobility training;Therapeutic activities;Therapeutic exercise;Balance training;Neuromuscular re-education;Patient/family education;Manual techniques;Passive range of motion;Dry needling;Taping;Vestibular;Spinal Manipulations;Joint Manipulations    PT Next Visit Plan Assess addition of prone exercises, manual for decreasing pain (PA mobs, suboccipital release); progress periscapular strenthening and cervical/thoracic mobility as able.    PT Home Exercise Plan Access Code: [email or web address]    Consulted and Agree with Plan of Care Patient             Patient will benefit from skilled therapeutic intervention in  order to improve the following deficits and impairments:  Decreased activity tolerance, Hypomobility, Decreased mobility, Decreased  endurance, Decreased range of motion, Postural dysfunction, Pain, Impaired flexibility, Increased fascial restricitons, Decreased strength, Impaired UE functional use  Visit Diagnosis: Pain in thoracic spine  Cervicalgia  Abnormal posture  Chronic bilateral low back pain with right-sided sciatica  Cramp and spasm     Problem List Patient Active Problem List   Diagnosis Date Noted   Primary osteoarthritis of left knee 01/13/2019   Primary osteoarthritis of right knee 01/13/2019   Flat foot 11/28/2017   Tendonitis of foot 11/28/2017   Polycythemia 05/12/2017   Vitamin D deficiency 04/21/2017   Bony sclerosis 08/05/2016   Lung nodule    Chronic pain of both knees 12/05/2015   Gastroesophageal reflux disease without esophagitis 05/17/2014   Essential hypertension 05/17/2014   Coronary artery calcification 08/17/2012   Abnormal LFTs (liver function tests) 07/11/2012   Chronic abdominal pain 06/24/2012   Right-sided chest wall pain 06/24/2012   Constipation, chronic 06/24/2012   Hyperbilirubinemia 06/24/2012   Chronic back pain 06/24/2012   DDD (degenerative disc disease), thoracolumbar 06/24/2012   History of nephrolithiasis 06/24/2012   Side pain 06/24/2012   Low back pain at multiple sites 06/24/2012   Gar Ponto Lena, PT 08/29/20 2:33 PM   [facility] [address] [place], [place], [address] Phone: [phone]   Fax:  [phone]  Name: MYLO CHOI MRN: [id number] Date of Birth: 07/03/1955

## 2020-09-02 ENCOUNTER — Ambulatory Visit: Payer: Medicare Other

## 2020-09-05 ENCOUNTER — Ambulatory Visit: Payer: Medicare Other

## 2020-09-05 ENCOUNTER — Other Ambulatory Visit: Payer: Self-pay

## 2020-09-05 DIAGNOSIS — R293 Abnormal posture: Secondary | ICD-10-CM

## 2020-09-05 DIAGNOSIS — M542 Cervicalgia: Secondary | ICD-10-CM

## 2020-09-05 DIAGNOSIS — M546 Pain in thoracic spine: Secondary | ICD-10-CM | POA: Diagnosis not present

## 2020-09-05 NOTE — Therapy (Signed)
[place], [place], [address] Phone: [phone]   Fax:  [phone]  Physical Therapy Treatment/Discharge  Patient Details  Name: Aaron Dennis MRN: [id number] Date of Birth: Aug 14, 1955 Referring Provider (PT): Eunice Blase, MD   Encounter Date: 09/05/2020   PT End of Session - 09/05/20 1131     Visit Number 6    Number of Visits 17    Date for PT Re-Evaluation 10/09/20    Authorization Type CAFA    PT Start Time 1130    PT Stop Time 1203    PT Time Calculation (min) 33 min    Activity Tolerance Patient tolerated treatment well    Behavior During Therapy [facility] for tasks assessed/performed             Past Medical History:  Diagnosis Date   GERD (gastroesophageal reflux disease)    Hypertension    Lung nodule    7 mm LUL nodule    No past surgical history on file.  There were no vitals filed for this visit.   Subjective Assessment - 09/05/20 1131     Subjective Pt presents to PT with no reports of neck pain. "I feel I have improved since starting therapy". He has been compliant with HEP with no adverse effect. He would like to make today the last visit, as he has a family matter he has to attend to in [place] and will be leaving the country for a few weeks. While he has not had neck and mid back pain in the last few days, he does note pain continued to be 5-7/10 when it is there at worse. He is ready to begin discharge assessment at this time.    Currently in Pain? No/denies    Pain Score 0-No pain                               [facility] Adult PT Treatment/Exercise - 09/05/20 0001       Neck Exercises: Theraband   Rows Limitations x 15 black tband      Neck Exercises: Standing   Neck Retraction Limitations 2x5 into a small exercise ball      Neck Exercises: Seated   Other Seated Exercise seated thoracic ext over soft foam x 15 - 3 sec hold    Other Seated Exercise  upper trap and levator stretches x 30 sec ea      Neck Exercises: Sidelying   Other Sidelying Exercise Open Book R/L x10ea      Neck Exercises: Prone   Other Prone Exercise cat cow x 10                    PT Education - 09/05/20 1204     Education Details HEP    Person(s) Educated Patient    Methods Explanation;Demonstration;Handout    Comprehension Verbalized understanding;Returned demonstration              PT Short Term Goals - 09/05/20 1136       PT SHORT TERM GOAL #1   Title Pt will be I and compliant with initial HEP.    Baseline initial HEP given    Time 3    Period Weeks    Status Achieved    Target Date 09/04/20      PT SHORT TERM GOAL #2   Title Pt will self report neck and mid back  pain no greater than 5/10 at worst in order to improve comfort and functional ability    Baseline 7/10 at worst    Time 3    Period Weeks    Status Partially Met    Target Date 09/04/20               PT Long Term Goals - 09/05/20 1204       PT LONG TERM GOAL #1   Title Pt will self report neck and mid back pain no greater than 3/10 at worst in order to improve comfort and functional ability    Baseline 7/10 at worst    Time 8    Period Weeks    Status Partially Met      PT LONG TERM GOAL #2   Title Pt will be able to reach overhead into cabinets with no increase in neck/mid back pain in order to improve functional ability to perform ADLs    Baseline unable without inc pain    Time 8    Period Weeks    Status Achieved      PT LONG TERM GOAL #3   Title Pt will be compliant with and understand progressions for advanced HEP for long term maintenance.    Time 8    Period Weeks    Status Achieved                   Plan - 09/05/20 1206     Clinical Impression Statement Pt was able to complete prescribed exercises and demonstrates knowledge of HEP with no adverse effect. Over the course of PT treamtent he has made improvements in decreasing  neck/mid back pain and improving function. PT treatment will stop today as he has to travel to [place] due to a family emergency. He should continue to improve with HEP compliance and was given update at today's final session.    PT Treatment/Interventions ADLs/Self Care Home Management;Cryotherapy;Electrical Stimulation;Moist Heat;Gait training;Stair training;Functional mobility training;Therapeutic activities;Therapeutic exercise;Balance training;Neuromuscular re-education;Patient/family education;Manual techniques;Passive range of motion;Dry needling;Taping;Vestibular;Spinal Manipulations;Joint Manipulations    PT Home Exercise Plan Access Code: [email or web address]    Consulted and Agree with Plan of Care Patient             Patient will benefit from skilled therapeutic intervention in order to improve the following deficits and impairments:  Decreased activity tolerance, Hypomobility, Decreased mobility, Decreased endurance, Decreased range of motion, Postural dysfunction, Pain, Impaired flexibility, Increased fascial restricitons, Decreased strength, Impaired UE functional use  Visit Diagnosis: Pain in thoracic spine  Cervicalgia  Abnormal posture     Problem List Patient Active Problem List   Diagnosis Date Noted   Primary osteoarthritis of left knee 01/13/2019   Primary osteoarthritis of right knee 01/13/2019   Flat foot 11/28/2017   Tendonitis of foot 11/28/2017   Polycythemia 05/12/2017   Vitamin D deficiency 04/21/2017   Bony sclerosis 08/05/2016   Lung nodule    Chronic pain of both knees 12/05/2015   Gastroesophageal reflux disease without esophagitis 05/17/2014   Essential hypertension 05/17/2014   Coronary artery calcification 08/17/2012   Abnormal LFTs (liver function tests) 07/11/2012   Chronic abdominal pain 06/24/2012   Right-sided chest wall pain 06/24/2012   Constipation, chronic 06/24/2012   Hyperbilirubinemia 06/24/2012   Chronic back pain 06/24/2012   DDD  (degenerative disc disease), thoracolumbar 06/24/2012   History of nephrolithiasis 06/24/2012   Side pain 06/24/2012   Low back pain at multiple sites 06/24/2012    Monia Sabal  Stroup, PT, DPT 09/05/20 12:09 PM   [facility] [address] [place], [place], [address] Phone: [phone]   Fax:  [phone]  Name: Aaron Dennis MRN: [id number] Date of Birth: 1956/01/03  PHYSICAL THERAPY DISCHARGE SUMMARY  Visits from Start of Care: 6  Current functional level related to goals / functional outcomes: See objective and goals   Remaining deficits: See objective and goals   Education / Equipment: See objective and goals   Patient agrees to discharge. Patient goals were partially met. Patient is being discharged due to the patient's request.

## 2020-10-01 ENCOUNTER — Other Ambulatory Visit: Payer: Self-pay

## 2020-10-17 ENCOUNTER — Other Ambulatory Visit: Payer: Self-pay

## 2020-10-17 ENCOUNTER — Other Ambulatory Visit: Payer: Self-pay | Admitting: Family Medicine

## 2020-10-17 DIAGNOSIS — I10 Essential (primary) hypertension: Secondary | ICD-10-CM

## 2020-10-18 ENCOUNTER — Ambulatory Visit: Payer: Self-pay | Admitting: Nurse Practitioner

## 2020-10-18 ENCOUNTER — Other Ambulatory Visit: Payer: Self-pay

## 2020-10-18 ENCOUNTER — Ambulatory Visit: Payer: Self-pay | Admitting: Podiatrist

## 2020-10-18 MED ORDER — AMLODIPINE BESYLATE 5 MG PO TABS
5.0000 mg | ORAL_TABLET | Freq: Every day | ORAL | 0 refills | Status: DC
  Filled 2020-10-18: qty 15, 15d supply, fill #0

## 2020-10-18 MED ORDER — OMEPRAZOLE 40 MG PO CPDR
40.0000 mg | DELAYED_RELEASE_CAPSULE | Freq: Every day | ORAL | 2 refills | Status: DC
  Filled 2020-10-18: qty 30, 30d supply, fill #0
  Filled 2020-11-04: qty 60, 60d supply, fill #1
  Filled 2020-11-04: qty 30, 30d supply, fill #1

## 2020-10-18 NOTE — Telephone Encounter (Signed)
Appointment 9/22- #15 supplied

## 2020-10-18 NOTE — Telephone Encounter (Signed)
Requested medication (s) are due for refill today -yes  Requested medication (s) are on the active medication list -yes  Future visit scheduled -yes  Last refill: 06/27/20  Notes to clinic: Request RF: outside provider  Requested Prescriptions  Pending Prescriptions Disp Refills   omeprazole (PRILOSEC) 40 MG capsule 90 capsule 0    Sig: TAKE 1 CAPSULE (40 MG TOTAL) BY MOUTH DAILY.     Gastroenterology: Proton Pump Inhibitors Passed - 10/17/2020  1:54 PM      Passed - Valid encounter within last 12 months    Recent Outpatient Visits           2 months ago Annual physical exam   [place] Charlott Rakes, MD   1 year ago Thoracolumbar back pain   [place], Enobong, MD   1 year ago Diabetes mellitus screening   [place], Enobong, MD   2 years ago Protruded cervical disc   [place], Deborah B, MD   2 years ago DDD (degenerative disc disease), thoracolumbar   [place], [place], [place]       Future Appointments             In 1 week Charlott Rakes, MD [place]            Signed Prescriptions Disp Refills   amLODipine (NORVASC) 5 MG tablet 15 tablet 0    Sig: Take 1 tablet (5 mg total) by mouth daily. OFFICE VISIT NEEDED FOR ADDITIONAL REFILLS     Cardiovascular:  Calcium Channel Blockers Passed - 10/17/2020  1:54 PM      Passed - Last BP in normal range    BP Readings from Last 1 Encounters:  08/14/20 123/70          Passed - Valid encounter within last 6 months    Recent Outpatient Visits           2 months ago Annual physical exam   [place] Charlott Rakes, MD   1 year ago Thoracolumbar back pain   [place], Enobong, MD   1 year ago Diabetes mellitus screening    [place], Enobong, MD   2 years ago Protruded cervical disc   [place] Ladell Pier, MD   2 years ago DDD (degenerative disc disease), thoracolumbar   [place], Dionne [place], [place]       Future Appointments             In 1 week Charlott Rakes, MD [place]               Requested Prescriptions  Pending Prescriptions Disp Refills   omeprazole (PRILOSEC) 40 MG capsule 90 capsule 0    Sig: TAKE 1 CAPSULE (40 MG TOTAL) BY MOUTH DAILY.     Gastroenterology: Proton Pump Inhibitors Passed - 10/17/2020  1:54 PM      Passed - Valid encounter within last 12 months    Recent Outpatient Visits           2 months ago Annual physical exam   [place], Enobong, MD   1 year ago Thoracolumbar back pain  [place], Enobong, MD   1 year ago Diabetes mellitus screening   [place], Enobong, MD   2 years ago Protruded cervical disc   [place], Deborah B, MD   2 years ago DDD (degenerative disc disease), thoracolumbar   [place], [place], [place]       Future Appointments             In 1 week Charlott Rakes, MD [place]            Signed Prescriptions Disp Refills   amLODipine (NORVASC) 5 MG tablet 15 tablet 0    Sig: Take 1 tablet (5 mg total) by mouth daily. OFFICE VISIT NEEDED FOR ADDITIONAL REFILLS     Cardiovascular:  Calcium Channel Blockers Passed - 10/17/2020  1:54 PM      Passed - Last BP in normal range    BP Readings from Last 1 Encounters:  08/14/20 123/70          Passed - Valid encounter within last 6 months    Recent Outpatient Visits           2 months ago Annual physical exam    [place] Charlott Rakes, MD   1 year ago Thoracolumbar back pain   [place], Enobong, MD   1 year ago Diabetes mellitus screening   [place], Enobong, MD   2 years ago Protruded cervical disc   [place], MD   2 years ago DDD (degenerative disc disease), thoracolumbar   [place], Dionne [place], [place]       Future Appointments             In 1 week Charlott Rakes, MD [employer]

## 2020-10-21 ENCOUNTER — Other Ambulatory Visit: Payer: Self-pay

## 2020-10-24 ENCOUNTER — Ambulatory Visit (INDEPENDENT_AMBULATORY_CARE_PROVIDER_SITE_OTHER): Payer: Medicare Other | Admitting: Sports Medicine

## 2020-10-24 ENCOUNTER — Other Ambulatory Visit: Payer: Self-pay

## 2020-10-24 ENCOUNTER — Encounter: Payer: Self-pay | Admitting: Sports Medicine

## 2020-10-24 DIAGNOSIS — M7750 Other enthesopathy of unspecified foot: Secondary | ICD-10-CM

## 2020-10-24 DIAGNOSIS — M722 Plantar fascial fibromatosis: Secondary | ICD-10-CM

## 2020-10-24 DIAGNOSIS — M79671 Pain in right foot: Secondary | ICD-10-CM | POA: Diagnosis not present

## 2020-10-24 DIAGNOSIS — M25571 Pain in right ankle and joints of right foot: Secondary | ICD-10-CM

## 2020-10-24 MED ORDER — METHYLPREDNISOLONE 4 MG PO TBPK
ORAL_TABLET | ORAL | 0 refills | Status: DC
  Filled 2020-10-24: qty 21, fill #0

## 2020-10-24 MED ORDER — METHYLPREDNISOLONE 4 MG PO TBPK
ORAL_TABLET | ORAL | 0 refills | Status: DC
  Filled 2020-10-24: qty 21, 7d supply, fill #0

## 2020-10-24 NOTE — Progress Notes (Signed)
Subjective: Aaron Dennis is a 65 y.o. male patient who returns to office for follow-up evaluation of right foot and ankle pain patient reports that everything hurts below the ankle on the right states that it is flared up since he retired in July.  Patient states that he still experiences some swelling and states the more walking and standing he does aggravates the foot and ankle and even has some low-grade pain even when sitting.  Patient denies any new trauma or injury.  Patient does report that his cousin has the same pain and has used steroid in the past.  Patient Active Problem List   Diagnosis Date Noted   Primary osteoarthritis of left knee 01/13/2019   Primary osteoarthritis of right knee 01/13/2019   Flat foot 11/28/2017   Tendonitis of foot 11/28/2017   Polycythemia 05/12/2017   Vitamin D deficiency 04/21/2017   Bony sclerosis 08/05/2016   Lung nodule    Chronic pain of both knees 12/05/2015   Gastroesophageal reflux disease without esophagitis 05/17/2014   Essential hypertension 05/17/2014   Coronary artery calcification 08/17/2012   Abnormal LFTs (liver function tests) 07/11/2012   Chronic abdominal pain 06/24/2012   Right-sided chest wall pain 06/24/2012   Constipation, chronic 06/24/2012   Hyperbilirubinemia 06/24/2012   Chronic back pain 06/24/2012   DDD (degenerative disc disease), thoracolumbar 06/24/2012   History of nephrolithiasis 06/24/2012   Side pain 06/24/2012   Low back pain at multiple sites 06/24/2012      No Known Allergies  Objective:  General: Alert and oriented x3 in no acute distress  Dermatology: No open lesions bilateral lower extremities, no webspace macerations, no ecchymosis bilateral, all nails x 10 are well manicured.  Vascular: Dorsalis Pedis and Posterior Tibial pedal pulses palpable, Capillary Fill Time 3 seconds,(+) pedal hair growth bilateral, no edema bilateral lower extremities, Temperature gradient within normal  limits.  Neurology: Johney Maine sensation intact via light touch bilateral.  Musculoskeletal: Mild to moderate tenderness with palpation at right medial and lateral ankle gutters, pain is worse today at the sinus tarsi, there is also pain at the Achilles and plantar fascial insertion as well on the right foot.  There is no limitation with range of motion however there is guarding due to pain on the right.  No pain with calf compression bilateral. + pes planus foot type bilateral.   Assessment and Plan: Problem List Items Addressed This Visit       Musculoskeletal and Integument   Tendonitis of foot   Other Visit Diagnoses     Plantar fasciitis of right foot    -  Primary   Sinus tarsi syndrome, right       Right foot pain            -Complete examination performed -Patient declines steroid injection at this time -Prescribed Medrol Dosepak for patient to take as directed  -Dispensed Surgigrip compression sleeve for patient to use as directed -Continue with power step insoles -Return to office if fails to continue to improve after he returns back from [place] Aaron Dennis, [place]

## 2020-10-31 ENCOUNTER — Other Ambulatory Visit: Payer: Self-pay

## 2020-10-31 ENCOUNTER — Encounter: Payer: Self-pay | Admitting: Family Medicine

## 2020-10-31 ENCOUNTER — Ambulatory Visit: Payer: Medicare Other | Attending: Family Medicine | Admitting: Family Medicine

## 2020-10-31 VITALS — BP 159/88 | HR 71 | Resp 16 | Wt 188.2 lb

## 2020-10-31 DIAGNOSIS — I10 Essential (primary) hypertension: Secondary | ICD-10-CM | POA: Diagnosis not present

## 2020-10-31 DIAGNOSIS — E559 Vitamin D deficiency, unspecified: Secondary | ICD-10-CM | POA: Diagnosis not present

## 2020-10-31 DIAGNOSIS — K5909 Other constipation: Secondary | ICD-10-CM | POA: Diagnosis not present

## 2020-10-31 DIAGNOSIS — K648 Other hemorrhoids: Secondary | ICD-10-CM

## 2020-10-31 MED ORDER — ERGOCALCIFEROL 1.25 MG (50000 UT) PO CAPS
50000.0000 [IU] | ORAL_CAPSULE | ORAL | 1 refills | Status: DC
  Filled 2020-10-31: qty 4, 28d supply, fill #0

## 2020-10-31 MED ORDER — POLYETHYLENE GLYCOL 3350 17 GM/SCOOP PO POWD
17.0000 g | Freq: Every day | ORAL | 1 refills | Status: DC
  Filled 2020-10-31 – 2020-11-01 (×2): qty 510, 30d supply, fill #0

## 2020-10-31 MED ORDER — HYDROCORT-PRAMOXINE (PERIANAL) 2.5-1 % EX CREA
1.0000 | TOPICAL_CREAM | Freq: Three times a day (TID) | CUTANEOUS | 1 refills | Status: DC
Start: 2020-10-31 — End: 2021-06-30
  Filled 2020-10-31: qty 30, 10d supply, fill #0

## 2020-10-31 MED ORDER — AMLODIPINE BESYLATE 5 MG PO TABS
5.0000 mg | ORAL_TABLET | Freq: Every day | ORAL | 1 refills | Status: DC
  Filled 2020-10-31: qty 90, 90d supply, fill #0

## 2020-10-31 NOTE — Patient Instructions (Signed)

## 2020-10-31 NOTE — Progress Notes (Signed)
Pt is requesting a 3 month supply of all his medications

## 2020-10-31 NOTE — Progress Notes (Signed)
Subjective:  Patient ID: Aaron Dennis, male    DOB: 1955/04/29  Age: 65 y.o. MRN: [id number]  CC: Hypertension and Medication Refill   HPI Aaron Dennis is a 65 y.o. year old male with a history of hypertension, polycythemia (s/p phlebotomy in the past), chronic thoracolumbar pain, GERD.  Interval History: He will travelling to [place] and will be away for 5 months and needs a 90-day supply of his medications.  His blood pressure is elevated and he attributes it to running out of his amlodipine as he only received a 15-day supply per patient. He has been constipated and has found himself straining with resulting development of hemorrhoids.  Denies presence of hematochezia.  Up-to-date on colonoscopy with next colonoscopy due in 04/2027. For his vitamin D deficiency he has been on a course of Drisdol.  He declines repeat labs today due to the cost. Past Medical History:  Diagnosis Date   GERD (gastroesophageal reflux disease)    Hypertension    Lung nodule    7 mm LUL nodule    No past surgical history on file.  Family History  Problem Relation Age of Onset   Healthy Mother    Diabetes Brother    Colon polyps Neg Hx    Colon cancer Neg Hx    Esophageal cancer Neg Hx    Rectal cancer Neg Hx    Stomach cancer Neg Hx     No Known Allergies  Current Outpatient Medications on File Prior to Visit  Medication Sig Dispense Refill   atorvastatin (LIPITOR) 10 MG tablet Take 1 tablet (10 mg total) by mouth daily. 90 tablet 1   baclofen (LIORESAL) 10 MG tablet Take 0.5-1 tablets (5-10 mg total) by mouth at bedtime as needed for muscle spasms. (Patient not taking: Reported on 10/31/2020) 90 tablet 1   meclizine (ANTIVERT) 25 MG tablet Take 1 tablet (25 mg total) by mouth 2 (two) times daily as needed for dizziness. (Patient not taking: Reported on 10/31/2020) 60 tablet 1   meloxicam (MOBIC) 7.5 MG tablet Take 1 tablet (7.5 mg total) by mouth daily. 90 tablet 1   methylPREDNISolone (MEDROL  DOSEPAK) 4 MG TBPK tablet Take as directed (Patient not taking: Reported on 10/31/2020) 21 tablet 0   omeprazole (PRILOSEC) 40 MG capsule Take 1 capsule (40 mg total) by mouth daily. 30 capsule 2   vitamin B-12 (CYANOCOBALAMIN) 1000 MCG tablet Take 1 tablet (1,000 mcg total) by mouth daily. 90 tablet 1   No current facility-administered medications on file prior to visit.       ROS Review of Systems  Constitutional:  Negative for activity change and appetite change.  HENT:  Negative for sinus pressure and sore throat.   Eyes:  Negative for visual disturbance.  Respiratory:  Negative for cough, chest tightness and shortness of breath.   Cardiovascular:  Negative for chest pain and leg swelling.  Gastrointestinal:  Positive for constipation. Negative for abdominal distention, abdominal pain and diarrhea.  Endocrine: Negative.   Genitourinary:  Negative for dysuria.  Musculoskeletal:  Negative for joint swelling and myalgias.  Skin:  Negative for rash.  Allergic/Immunologic: Negative.   Neurological:  Negative for weakness, light-headedness and numbness.  Psychiatric/Behavioral:  Negative for dysphoric mood and suicidal ideas.    Objective:  BP (!) 159/88   Pulse 71   Resp 16   Wt 188 lb 3.2 oz (85.4 kg)   SpO2 97%   BMI 27.00 kg/m   BP/Weight 10/31/2020 08/14/2020  0/07/3014  Systolic BP 010 932 355  Diastolic BP 88 86 70  Wt. (Lbs) 188.2 - 183  BMI 27 - 26.26      Physical Exam Constitutional:      Appearance: He is well-developed.  Cardiovascular:     Rate and Rhythm: Normal rate.     Heart sounds: Normal heart sounds. No murmur heard. Pulmonary:     Effort: Pulmonary effort is normal.     Breath sounds: Normal breath sounds. No wheezing or rales.  Chest:     Chest wall: No tenderness.  Abdominal:     General: Bowel sounds are normal. There is no distension.     Palpations: Abdomen is soft. There is no mass.     Tenderness: There is no abdominal tenderness.   Musculoskeletal:        General: Normal range of motion.     Right lower leg: No edema.     Left lower leg: No edema.  Neurological:     Mental Status: He is alert and oriented to person, place, and time.  Psychiatric:        Mood and Affect: Mood normal.    CMP Latest Ref Rng & Units 06/26/2020 11/29/2018 07/12/2017  Glucose 65 - 99 mg/dL 81 83 106(H)  BUN 8 - 27 mg/dL 19 18 11   Creatinine 0.76 - 1.27 mg/dL 0.95 0.99 0.97  Sodium 134 - 144 mmol/L 139 138 140  Potassium 3.5 - 5.2 mmol/L 3.6 3.8 3.8  Chloride 96 - 106 mmol/L 101 102 105  CO2 20 - 29 mmol/L - 22 24  Calcium 8.6 - 10.2 mg/dL 9.0 9.1 8.8(L)  Total Protein 6.0 - 8.5 g/dL 6.9 6.9 7.3  Total Bilirubin 0.0 - 1.2 mg/dL 1.9(H) 1.1 2.3(H)  Alkaline Phos 44 - 121 IU/L 170(H) 173(H) 137(H)  AST 0 - 40 IU/L 20 22 17   ALT 0 - 44 IU/L - 22 16(L)    Lipid Panel     Component Value Date/Time   CHOL 198 06/26/2020 0944   TRIG 69 06/26/2020 0944   HDL 38 (L) 06/26/2020 0944   CHOLHDL 5.2 (H) 06/26/2020 0944   CHOLHDL 6.7 (H) 12/05/2015 1109   VLDL 23 12/05/2015 1109   LDLCALC 147 (H) 06/26/2020 0944    CBC    Component Value Date/Time   WBC 6.6 06/26/2020 0944   WBC 7.3 09/13/2017 1425   WBC 7.6 07/12/2017 1443   RBC 6.61 (H) 06/26/2020 0944   RBC 6.12 (H) 09/13/2017 1425   HGB 17.8 (H) 06/26/2020 0944   HGB 16.5 03/13/2009 0931   HCT 54.5 (H) 06/26/2020 0944   HCT 49.8 03/13/2009 0931   PLT 181 06/26/2020 0944   MCV 83 06/26/2020 0944   MCV 82.2 03/13/2009 0931   MCH 26.9 06/26/2020 0944   MCH 27.8 09/13/2017 1425   MCHC 32.7 06/26/2020 0944   MCHC 33.3 09/13/2017 1425   RDW 16.3 (H) 06/26/2020 0944   RDW 15.5 (H) 03/13/2009 0931   LYMPHSABS 2.2 06/26/2020 0944   LYMPHSABS 2.2 03/13/2009 0931   MONOABS 0.6 09/13/2017 1425   MONOABS 0.5 03/13/2009 0931   EOSABS 0.0 06/26/2020 0944   BASOSABS 0.0 06/26/2020 0944   BASOSABS 0.0 03/13/2009 0931    Lab Results  Component Value Date   HGBA1C 5.3 08/14/2020     Assessment & Plan:  1. Essential hypertension Uncontrolled due to running out of medication which I have refilled Counseled on blood pressure goal of less than 130/80,  low-sodium, DASH diet, medication compliance, 150 minutes of moderate intensity exercise per week. Discussed medication compliance, adverse effects. - amLODipine (NORVASC) 5 MG tablet; Take 1 tablet (5 mg total) by mouth daily.  Dispense: 90 tablet; Refill: 1  2. Other constipation Advised to increase fiber intake, increase water intake - polyethylene glycol powder (GLYCOLAX/MIRALAX) 17 GM/SCOOP powder; Take 17 g by mouth daily.  Dispense: 3350 g; Refill: 1  3. Other hemorrhoids Secondary to constipation Will work on preventing constipation - hydrocortisone-pramoxine (ANALPRAM HC) 2.5-1 % rectal cream; Place 1 application rectally 3 (three) times daily.  Dispense: 30 g; Refill: 1  4. Vitamin D deficiency Currently on vitamin D replacement Will refill medication Would like to check repeat vitamin D level however he declines due to cost as he states he previously received a huge bill for blood work previously done. - ergocalciferol (DRISDOL) 1.25 MG (50000 UT) capsule; Take 1 capsule (50,000 Units total) by mouth once a week.  Dispense: 12 capsule; Refill: 1   Meds ordered this encounter  Medications   amLODipine (NORVASC) 5 MG tablet    Sig: Take 1 tablet (5 mg total) by mouth daily.    Dispense:  90 tablet    Refill:  1    Please dispense #90   hydrocortisone-pramoxine (ANALPRAM HC) 2.5-1 % rectal cream    Sig: Place 1 application rectally 3 (three) times daily.    Dispense:  30 g    Refill:  1   polyethylene glycol powder (GLYCOLAX/MIRALAX) 17 GM/SCOOP powder    Sig: Take 17 g by mouth daily.    Dispense:  3350 g    Refill:  1   ergocalciferol (DRISDOL) 1.25 MG (50000 UT) capsule    Sig: Take 1 capsule (50,000 Units total) by mouth once a week.    Dispense:  12 capsule    Refill:  1    Follow-up:  Return for Medical conditions, please schedule appointment when back in the country.Charlott Rakes, MD, FAAFP. [facility] [place], [place]   10/31/2020, 10:59 AM

## 2020-11-01 ENCOUNTER — Other Ambulatory Visit: Payer: Self-pay

## 2020-11-01 DIAGNOSIS — I10 Essential (primary) hypertension: Secondary | ICD-10-CM | POA: Diagnosis not present

## 2020-11-01 DIAGNOSIS — K219 Gastro-esophageal reflux disease without esophagitis: Secondary | ICD-10-CM | POA: Diagnosis not present

## 2020-11-01 DIAGNOSIS — E559 Vitamin D deficiency, unspecified: Secondary | ICD-10-CM | POA: Diagnosis not present

## 2020-11-01 DIAGNOSIS — E785 Hyperlipidemia, unspecified: Secondary | ICD-10-CM | POA: Diagnosis not present

## 2020-11-01 DIAGNOSIS — E538 Deficiency of other specified B group vitamins: Secondary | ICD-10-CM | POA: Diagnosis not present

## 2020-11-04 ENCOUNTER — Other Ambulatory Visit: Payer: Self-pay

## 2020-11-05 ENCOUNTER — Other Ambulatory Visit: Payer: Self-pay

## 2020-11-08 ENCOUNTER — Encounter: Payer: Self-pay | Admitting: Orthopaedic Surgery

## 2020-11-08 ENCOUNTER — Ambulatory Visit (INDEPENDENT_AMBULATORY_CARE_PROVIDER_SITE_OTHER): Payer: Medicare Other | Admitting: Orthopaedic Surgery

## 2020-11-08 ENCOUNTER — Other Ambulatory Visit: Payer: Self-pay

## 2020-11-08 DIAGNOSIS — M170 Bilateral primary osteoarthritis of knee: Secondary | ICD-10-CM

## 2020-11-08 MED ORDER — LIDOCAINE HCL 1 % IJ SOLN
2.0000 mL | INTRAMUSCULAR | Status: AC | PRN
  Administered 2020-11-08: 2 mL

## 2020-11-08 MED ORDER — BUPIVACAINE HCL 0.5 % IJ SOLN
2.0000 mL | INTRAMUSCULAR | Status: AC | PRN
  Administered 2020-11-08: 2 mL via INTRA_ARTICULAR

## 2020-11-08 MED ORDER — METHYLPREDNISOLONE ACETATE 40 MG/ML IJ SUSP
40.0000 mg | INTRAMUSCULAR | Status: AC | PRN
Start: 2020-11-08 — End: 2020-11-08
  Administered 2020-11-08: 40 mg via INTRA_ARTICULAR

## 2020-11-08 MED ORDER — METHYLPREDNISOLONE ACETATE 40 MG/ML IJ SUSP
40.0000 mg | INTRAMUSCULAR | Status: AC | PRN
  Administered 2020-11-08: 40 mg via INTRA_ARTICULAR

## 2020-11-08 NOTE — Progress Notes (Signed)
Office Visit Note   Patient: Aaron Dennis           Date of Birth: [birth year]           MRN: [id number] Visit Date: 11/08/2020              Requested by: Charlott Rakes, MD [place],  [place] [address] PCP: Charlott Rakes, MD   Assessment & Plan: Visit Diagnoses:  1. Bilateral primary osteoarthritis of knee     Plan: Impression is bilateral knee chondromalacia patella.  We discussed repeat cortisone injections for which the patient would like to proceed.  He will follow-up with [place] as needed.  Call with concerns or questions.  Follow-Up Instructions: Return if symptoms worsen or fail to improve.   Orders:  No orders of the defined types were placed in this encounter.  No orders of the defined types were placed in this encounter.     Procedures: Large Joint Inj: bilateral knee on 11/08/2020 1:42 PM Indications: pain Details: 22 G needle  Arthrogram: No  Medications (Right): 2 mL lidocaine 1 %; 2 mL bupivacaine 0.5 %; 40 mg methylPREDNISolone acetate 40 MG/ML Medications (Left): 2 mL lidocaine 1 %; 2 mL bupivacaine 0.5 %; 40 mg methylPREDNISolone acetate 40 MG/ML Outcome: tolerated well, no immediate complications Patient was prepped and draped in the usual sterile fashion.      Clinical Data: No additional findings.   Subjective: Chief Complaint  Patient presents with  . Left Knee - Pain  . Right Knee - Pain    HPI patient is a pleasant 65 year old gentleman who comes in today with recurrent bilateral knee pain left greater than right.  He was seen in our office for similar symptoms about 5 months ago.  He was diagnosed with chondromalacia patella.  Bilateral knee cortisone injections performed which helped quite a bit up until recently.  The pain he has is worse with activity and going from a seated to standing position.  He has an upcoming trip to [place] where he will be gone for 6 months and would like repeat cortisone injections just prior to  his departure.  Review of Systems as detailed in HPI.  All others reviewed and are negative.   Objective: Vital Signs: There were no vitals taken for this visit.  Physical Exam well-developed well-nourished gentleman in no acute distress.  Alert and oriented x3.  Ortho Exam bilateral knee exam shows no effusion.  Range of motion 0 to 110 degrees.  No joint line tenderness.  Moderate patellofemoral crepitus.  Ligaments are stable.  He is neurovascular intact distally.  Specialty Comments:  No specialty comments available.  Imaging: No new imaging   PMFS History: Patient Active Problem List   Diagnosis Date Noted  . Primary osteoarthritis of left knee 01/13/2019  . Primary osteoarthritis of right knee 01/13/2019  . Flat foot 11/28/2017  . Tendonitis of foot 11/28/2017  . Polycythemia 05/12/2017  . Vitamin D deficiency 04/21/2017  . Bony sclerosis 08/05/2016  . Lung nodule   . Chronic pain of both knees 12/05/2015  . Gastroesophageal reflux disease without esophagitis 05/17/2014  . Essential hypertension 05/17/2014  . Coronary artery calcification 08/17/2012  . Abnormal LFTs (liver function tests) 07/11/2012  . Chronic abdominal pain 06/24/2012  . Right-sided chest wall pain 06/24/2012  . Constipation, chronic 06/24/2012  . Hyperbilirubinemia 06/24/2012  . Chronic back pain 06/24/2012  . DDD (degenerative disc disease), thoracolumbar 06/24/2012  . History of nephrolithiasis 06/24/2012  .  Side pain 06/24/2012  . Low back pain at multiple sites 06/24/2012   Past Medical History:  Diagnosis Date  . GERD (gastroesophageal reflux disease)   . Hypertension   . Lung nodule    7 mm LUL nodule    Family History  Problem Relation Age of Onset  . Healthy Mother   . Diabetes Brother   . Colon polyps Neg Hx   . Colon cancer Neg Hx   . Esophageal cancer Neg Hx   . Rectal cancer Neg Hx   . Stomach cancer Neg Hx     History reviewed. No pertinent surgical history. Social  History   Occupational History  . Not on file  Tobacco Use  . Smoking status: Former  . Smokeless tobacco: Never  . Tobacco comments:    quit Jan 2018  Vaping Use  . Vaping Use: Never used  Substance and Sexual Activity  . Alcohol use: No  . Drug use: No  . Sexual activity: Not on file

## 2020-11-25 ENCOUNTER — Other Ambulatory Visit: Payer: Self-pay

## 2020-11-25 ENCOUNTER — Emergency Department (HOSPITAL_BASED_OUTPATIENT_CLINIC_OR_DEPARTMENT_OTHER): Payer: Medicare Other

## 2020-11-25 ENCOUNTER — Other Ambulatory Visit (HOSPITAL_BASED_OUTPATIENT_CLINIC_OR_DEPARTMENT_OTHER): Payer: Self-pay

## 2020-11-25 ENCOUNTER — Encounter (HOSPITAL_BASED_OUTPATIENT_CLINIC_OR_DEPARTMENT_OTHER): Payer: Self-pay

## 2020-11-25 ENCOUNTER — Emergency Department (HOSPITAL_BASED_OUTPATIENT_CLINIC_OR_DEPARTMENT_OTHER)
Admission: EM | Admit: 2020-11-25 | Discharge: 2020-11-25 | Disposition: A | Discharge status: Home / Self Care | Payer: Medicare Other | Attending: Emergency Medicine | Admitting: Emergency Medicine

## 2020-11-25 DIAGNOSIS — I10 Essential (primary) hypertension: Secondary | ICD-10-CM | POA: Diagnosis not present

## 2020-11-25 DIAGNOSIS — M79605 Pain in left leg: Secondary | ICD-10-CM | POA: Diagnosis not present

## 2020-11-25 DIAGNOSIS — Z79899 Other long term (current) drug therapy: Secondary | ICD-10-CM | POA: Diagnosis not present

## 2020-11-25 DIAGNOSIS — Z87891 Personal history of nicotine dependence: Secondary | ICD-10-CM | POA: Insufficient documentation

## 2020-11-25 DIAGNOSIS — M79604 Pain in right leg: Secondary | ICD-10-CM | POA: Insufficient documentation

## 2020-11-25 DIAGNOSIS — Z7901 Long term (current) use of anticoagulants: Secondary | ICD-10-CM | POA: Diagnosis not present

## 2020-11-25 LAB — CBC WITH DIFFERENTIAL/PLATELET
Abs Immature Granulocytes: 0.02 10*3/uL (ref 0.00–0.07)
Basophils Absolute: 0.1 10*3/uL (ref 0.0–0.1)
Basophils Relative: 1 %
Eosinophils Absolute: 0 10*3/uL (ref 0.0–0.5)
Eosinophils Relative: 0 %
HCT: 51.8 % (ref 39.0–52.0)
Hemoglobin: 17.2 g/dL — ABNORMAL HIGH (ref 13.0–17.0)
Immature Granulocytes: 0 %
Lymphocytes Relative: 29 %
Lymphs Abs: 2.2 10*3/uL (ref 0.7–4.0)
MCH: 27 pg (ref 26.0–34.0)
MCHC: 33.2 g/dL (ref 30.0–36.0)
MCV: 81.4 fL (ref 80.0–100.0)
Monocytes Absolute: 0.6 10*3/uL (ref 0.1–1.0)
Monocytes Relative: 8 %
Neutro Abs: 4.6 10*3/uL (ref 1.7–7.7)
Neutrophils Relative %: 62 %
Platelets: 257 10*3/uL (ref 150–400)
RBC: 6.36 MIL/uL — ABNORMAL HIGH (ref 4.22–5.81)
RDW: 14.2 % (ref 11.5–15.5)
WBC: 7.4 10*3/uL (ref 4.0–10.5)
nRBC: 0 % (ref 0.0–0.2)

## 2020-11-25 LAB — BASIC METABOLIC PANEL
Anion gap: 7 (ref 5–15)
BUN: 13 mg/dL (ref 8–23)
CO2: 27 mmol/L (ref 22–32)
Calcium: 8.7 mg/dL — ABNORMAL LOW (ref 8.9–10.3)
Chloride: 104 mmol/L (ref 98–111)
Creatinine, Ser: 0.91 mg/dL (ref 0.61–1.24)
GFR, Estimated: >60 mL/min (ref >60)
Glucose, Bld: 87 mg/dL (ref 70–99)
Potassium: 3.3 mmol/L — ABNORMAL LOW (ref 3.5–5.1)
Sodium: 138 mmol/L (ref 135–145)

## 2020-11-25 MED ORDER — HYDROCODONE-ACETAMINOPHEN 5-325 MG PO TABS
1.0000 | ORAL_TABLET | Freq: Four times a day (QID) | ORAL | 0 refills | Status: DC | PRN
  Filled 2020-11-25: qty 6, 2d supply, fill #0

## 2020-11-25 MED ORDER — HYDROCODONE-ACETAMINOPHEN 5-325 MG PO TABS
1.0000 | ORAL_TABLET | Freq: Once | ORAL | Status: AC
Start: 2020-11-25 — End: 2020-11-25
  Administered 2020-11-25: 1 via ORAL
  Filled 2020-11-25: qty 1

## 2020-11-25 MED ORDER — POTASSIUM CHLORIDE CRYS ER 20 MEQ PO TBCR
20.0000 meq | EXTENDED_RELEASE_TABLET | Freq: Once | ORAL | Status: AC
  Administered 2020-11-25: 20 meq via ORAL
  Filled 2020-11-25: qty 1

## 2020-11-25 NOTE — ED Triage Notes (Addendum)
Pt c/o bilateral posterior knee and calf pain starting last night. Pain worse with extension of legs. Denies hx of DVT

## 2020-11-25 NOTE — Discharge Instructions (Signed)
It was a pleasure taking care of you today.  As discussed, your ultrasound did not show any blood clots.  Your labs were reassuring.  Your potassium was slightly low.  You were given potassium here in the ER.  Please follow-up with your PCP by the end of this week for further evaluation.  I am sending you home with a short course of pain medications.  Save for severe pain.  Pain medication can cause drowsiness and do not drive or operate machinery while on the medication.  Continue to take your meloxicam and muscle relaxers as previously prescribed.  Return to the ER for any worsening symptoms.

## 2020-11-25 NOTE — ED Notes (Signed)
Pt endorses getting driver r/t receiving pain medication.

## 2020-11-25 NOTE — ED Notes (Signed)
Pt discharged to home. Discharge instructions have been discussed with patient and/or family members. Pt verbally acknowledges understanding d/c instructions, and endorses comprehension to checkout at registration before leaving.  °

## 2020-11-25 NOTE — ED Notes (Signed)
US at bedside

## 2020-11-25 NOTE — ED Notes (Signed)
US Bedside.

## 2020-11-25 NOTE — ED Provider Notes (Signed)
[facility] EMERGENCY DEPARTMENT Provider Note   CSN: [id number] Arrival date & time: 11/25/20  0856     History Chief Complaint  Patient presents with   Leg Pain    Aaron Dennis is a 65 y.o. male with a past medical history significant for chronic back pain, vitamin D deficiency, polycythemia, GERD, hypertension, and osteoarthritis of bilateral knees who presents to the ED due to bilateral posterior leg pain that has progressively worsened over the past few days. Pain located  in posterior aspect of thighs. Patient states pain is worse with extension of bilateral legs.  Patient admits to chronic back pain however, denies any worsening of pain.  Denies saddle paresthesias, bowel/bladder incontinence, lower extremity numbness/tingling, lower extremity weakness, fever/chills, IV drug use, and history of cancer.  Denies associated abdominal pain and urinary symptoms.  No known injury.  Patient denies associated edema and warmth.  No history of blood clots.  He is not currently on any anticoagulants.  Denies chest pain and shortness of breath.  He has been taking meloxicam and a muscle relaxer with moderate relief.  Chart reviewed.  Patient was evaluated by orthopedics on 11/08/2020 due to bilateral primary osteoarthritis of knees.  At the time of the office visit, patient had bilateral knee injections. He has been previously diagnosed with chondromalacia patella per orthopedics. Patient states this pain is different than his typical knee pain.  History obtained from patient and past medical records. No interpreter used during encounter.      Past Medical History:  Diagnosis Date   GERD (gastroesophageal reflux disease)    Hypertension    Lung nodule    7 mm LUL nodule    Patient Active Problem List   Diagnosis Date Noted   Primary osteoarthritis of left knee 01/13/2019   Primary osteoarthritis of right knee 01/13/2019   Flat foot 11/28/2017   Tendonitis of foot 11/28/2017    Polycythemia 05/12/2017   Vitamin D deficiency 04/21/2017   Bony sclerosis 08/05/2016   Lung nodule    Chronic pain of both knees 12/05/2015   Gastroesophageal reflux disease without esophagitis 05/17/2014   Essential hypertension 05/17/2014   Coronary artery calcification 08/17/2012   Abnormal LFTs (liver function tests) 07/11/2012   Chronic abdominal pain 06/24/2012   Right-sided chest wall pain 06/24/2012   Constipation, chronic 06/24/2012   Hyperbilirubinemia 06/24/2012   Chronic back pain 06/24/2012   DDD (degenerative disc disease), thoracolumbar 06/24/2012   History of nephrolithiasis 06/24/2012   Side pain 06/24/2012   Low back pain at multiple sites 06/24/2012    History reviewed. No pertinent surgical history.     Family History  Problem Relation Age of Onset   Healthy Mother    Diabetes Brother    Colon polyps Neg Hx    Colon cancer Neg Hx    Esophageal cancer Neg Hx    Rectal cancer Neg Hx    Stomach cancer Neg Hx     Social History   Tobacco Use   Smoking status: Former   Smokeless tobacco: Never   Tobacco comments:    quit Jan 2018  Vaping Use   Vaping Use: Never used  Substance Use Topics   Alcohol use: No   Drug use: No    Home Medications Prior to Admission medications   Medication Sig Start Date End Date Taking? Authorizing Provider  HYDROcodone-acetaminophen (NORCO/VICODIN) 5-325 MG tablet Take 1 tablet by mouth every 6 (six) hours as needed. 11/25/20  Yes Charmaine Downs  C, PA-C  amLODipine (NORVASC) 5 MG tablet Take 1 tablet (5 mg total) by mouth daily. 10/31/20 10/31/21  Charlott Rakes, MD  atorvastatin (LIPITOR) 10 MG tablet Take 1 tablet (10 mg total) by mouth daily. 08/14/20   Charlott Rakes, MD  baclofen (LIORESAL) 10 MG tablet Take 0.5-1 tablets (5-10 mg total) by mouth at bedtime as needed for muscle spasms. Patient not taking: Reported on 10/31/2020 08/14/20   Charlott Rakes, MD  ergocalciferol (DRISDOL) 1.25 MG (50000 UT) capsule  Take 1 capsule (50,000 Units total) by mouth once a week. 10/31/20   Charlott Rakes, MD  hydrocortisone-pramoxine (ANALPRAM HC) 2.5-1 % rectal cream Place 1 application rectally 3 (three) times daily. 10/31/20   Charlott Rakes, MD  meclizine (ANTIVERT) 25 MG tablet Take 1 tablet (25 mg total) by mouth 2 (two) times daily as needed for dizziness. Patient not taking: Reported on 10/31/2020 08/14/20   Charlott Rakes, MD  meloxicam (MOBIC) 7.5 MG tablet Take 1 tablet (7.5 mg total) by mouth daily. 08/14/20   Charlott Rakes, MD  methylPREDNISolone (MEDROL DOSEPAK) 4 MG TBPK tablet Take as directed Patient not taking: Reported on 10/31/2020 10/24/20   Landis Martins, DPM  omeprazole (PRILOSEC) 40 MG capsule Take 1 capsule (40 mg total) by mouth daily. 10/18/20 01/19/21  Charlott Rakes, MD  polyethylene glycol powder (GLYCOLAX/MIRALAX) 17 GM/SCOOP powder Take 17 g by mouth daily. 10/31/20   Charlott Rakes, MD  vitamin B-12 (CYANOCOBALAMIN) 1000 MCG tablet Take 1 tablet (1,000 mcg total) by mouth daily. 08/15/20   Charlott Rakes, MD    Allergies    Patient has no known allergies.  Review of Systems   Review of Systems  Constitutional:  Negative for chills and fever.  Gastrointestinal:  Negative for abdominal pain.  Genitourinary:  Negative for dysuria.  Musculoskeletal:  Positive for arthralgias, back pain (chronic) and myalgias. Negative for gait problem and joint swelling.  Neurological:  Negative for weakness and numbness.  All other systems reviewed and are negative.  Physical Exam Updated Vital Signs BP (!) 141/84   Pulse 64   Temp 98 F (36.7 C) (Oral)   Resp 18   Ht 5\' 10"  (1.778 m)   Wt 85.7 kg   SpO2 99%   BMI 27.12 kg/m   Physical Exam Vitals and nursing note reviewed.  Constitutional:      General: He is not in acute distress.    Appearance: He is not ill-appearing.  HENT:     Head: Normocephalic.  Eyes:     Pupils: Pupils are equal, round, and reactive to light.   Cardiovascular:     Rate and Rhythm: Normal rate and regular rhythm.     Pulses: Normal pulses.     Heart sounds: Normal heart sounds. No murmur heard.   No friction rub. No gallop.  Pulmonary:     Effort: Pulmonary effort is normal.     Breath sounds: Normal breath sounds.  Abdominal:     General: Abdomen is flat. There is no distension.     Palpations: Abdomen is soft.     Tenderness: There is no abdominal tenderness. There is no guarding or rebound.  Musculoskeletal:        General: Normal range of motion.     Cervical back: Neck supple.     Comments: No thoracic or lumbar midline tenderness.  Mild tenderness to bilateral hamstrings.  No edema, warmth, or erythema.  Pedal pulses palpable bilaterally.  Soft compartments.  Skin:    General:  Skin is warm and dry.  Neurological:     General: No focal deficit present.     Mental Status: He is alert.  Psychiatric:        Mood and Affect: Mood normal.        Behavior: Behavior normal.    ED Results / Procedures / Treatments   Labs (all labs ordered are listed, but only abnormal results are displayed) Labs Reviewed  CBC WITH DIFFERENTIAL/PLATELET - Abnormal; Notable for the following components:      Result Value   RBC 6.36 (*)    Hemoglobin 17.2 (*)    All other components within normal limits  BASIC METABOLIC PANEL - Abnormal; Notable for the following components:   Potassium 3.3 (*)    Calcium 8.7 (*)    All other components within normal limits    EKG None  Radiology US Venous Img Lower Bilateral  Result Date: 11/25/2020 CLINICAL DATA:  Bilateral leg pain EXAM: BILATERAL LOWER EXTREMITY VENOUS DOPPLER ULTRASOUND TECHNIQUE: Gray-scale sonography with compression, as well as color and duplex ultrasound, were performed to evaluate the deep venous system(s) from the level of the common femoral vein through the popliteal and proximal calf veins. COMPARISON:  None. FINDINGS: VENOUS Normal compressibility of the common  femoral, superficial femoral, and popliteal veins, as well as the visualized calf veins. Visualized portions of profunda femoral vein and great saphenous vein unremarkable. No filling defects to suggest DVT on grayscale or color Doppler imaging. Doppler waveforms show normal direction of venous flow, normal respiratory plasticity and response to augmentation. OTHER None. Limitations: none IMPRESSION: No lower extremity DVT. Electronically Signed   By: Miachel Roux M.D.   On: 11/25/2020 11:26    Procedures Procedures   Medications Ordered in ED Medications  potassium chloride SA (KLOR-CON) CR tablet 20 mEq (has no administration in time range)  HYDROcodone-acetaminophen (NORCO/VICODIN) 5-325 MG per tablet 1 tablet (1 tablet Oral Given 11/25/20 1043)    ED Course  I have reviewed the triage vital signs and the nursing notes.  Pertinent labs & imaging results that were available during my care of the patient were reviewed by me and considered in my medical decision making (see chart for details).    MDM Rules/Calculators/A&P                           64 year old male presents to the ED due to bilateral leg pain.  History of chronic bilateral knee osteoarthritis and recently had steroid injections.  Patient notes this pain is different.  No known injuries.  No history of blood clots.  Upon arrival, patient afebrile, not tachycardic or hypoxic.  Patient in no acute distress.  No thoracic or lumbar midline tenderness.  Bilateral lower extremities neurovascularly intact with soft compartments.  Low suspicion for cauda equina or central cord compression.  DVT study ordered to rule out DVT however, my suspicion is lower given it is bilateral.  Possible MSK etiology if ultrasound is negative.  Pain medication given.  Routine labs ordered.  [place] negative for DVT. Labs reassuring.  Mild hypokalemia.  Potassium repleted here in the ED.  Bilateral pulses palpable, low suspicion for artery occlusion. No known  injury, low suspicion for bony fractures. No signs of infection on exam and normal WBC, low suspicion for infectious etiology. Patient admits to resolution in pain after pain medication. Patient discharged with short course of pain medication. Advised patient to save only for severe pain  and to continue Meloxicam and muscle relaxer as previously prescribed.  Advised patient to follow-up with PCP the end of this week for further evaluation. Strict ED precautions discussed with patient. Patient states understanding and agrees to plan. Patient discharged home in no acute distress and stable vitals  Discussed case with Dr. Almyra Free who agrees with assessment and plan.  Final Clinical Impression(s) / ED Diagnoses Final diagnoses:  Bilateral leg pain    Rx / DC Orders ED Discharge Orders          Ordered    HYDROcodone-acetaminophen (NORCO/VICODIN) 5-325 MG tablet  Every 6 hours PRN        11/25/20 1232             Karie Kirks 11/25/20 1234    Luna Fuse, MD 11/29/20 [phone]

## 2020-12-01 DIAGNOSIS — Z20822 Contact with and (suspected) exposure to covid-19: Secondary | ICD-10-CM | POA: Diagnosis not present

## 2021-04-27 ENCOUNTER — Emergency Department (HOSPITAL_COMMUNITY): Payer: Medicare Other

## 2021-04-27 ENCOUNTER — Emergency Department (HOSPITAL_BASED_OUTPATIENT_CLINIC_OR_DEPARTMENT_OTHER): Payer: Medicare Other

## 2021-04-27 ENCOUNTER — Other Ambulatory Visit: Payer: Self-pay

## 2021-04-27 ENCOUNTER — Observation Stay (HOSPITAL_COMMUNITY): Payer: Medicare Other

## 2021-04-27 ENCOUNTER — Encounter (HOSPITAL_COMMUNITY): Payer: Self-pay | Admitting: Emergency Medicine

## 2021-04-27 ENCOUNTER — Inpatient Hospital Stay (HOSPITAL_COMMUNITY)
Admission: EM | Admit: 2021-04-27 | Discharge: 2021-04-29 | DRG: 064 | Disposition: A | Discharge status: Rehabilitation Facility | Payer: Medicare Other | Attending: Internal Medicine | Admitting: Internal Medicine

## 2021-04-27 DIAGNOSIS — I639 Cerebral infarction, unspecified: Secondary | ICD-10-CM | POA: Diagnosis not present

## 2021-04-27 DIAGNOSIS — I2510 Atherosclerotic heart disease of native coronary artery without angina pectoris: Secondary | ICD-10-CM | POA: Diagnosis present

## 2021-04-27 DIAGNOSIS — I10 Essential (primary) hypertension: Secondary | ICD-10-CM | POA: Diagnosis not present

## 2021-04-27 DIAGNOSIS — R531 Weakness: Principal | ICD-10-CM

## 2021-04-27 DIAGNOSIS — K5900 Constipation, unspecified: Secondary | ICD-10-CM | POA: Diagnosis present

## 2021-04-27 DIAGNOSIS — Z20822 Contact with and (suspected) exposure to covid-19: Secondary | ICD-10-CM | POA: Diagnosis not present

## 2021-04-27 DIAGNOSIS — G8191 Hemiplegia, unspecified affecting right dominant side: Secondary | ICD-10-CM | POA: Diagnosis not present

## 2021-04-27 DIAGNOSIS — R748 Abnormal levels of other serum enzymes: Secondary | ICD-10-CM | POA: Diagnosis present

## 2021-04-27 DIAGNOSIS — K219 Gastro-esophageal reflux disease without esophagitis: Secondary | ICD-10-CM | POA: Diagnosis not present

## 2021-04-27 DIAGNOSIS — I6622 Occlusion and stenosis of left posterior cerebral artery: Secondary | ICD-10-CM | POA: Diagnosis not present

## 2021-04-27 DIAGNOSIS — I6389 Other cerebral infarction: Secondary | ICD-10-CM

## 2021-04-27 DIAGNOSIS — M6281 Muscle weakness (generalized): Secondary | ICD-10-CM | POA: Diagnosis not present

## 2021-04-27 DIAGNOSIS — Z833 Family history of diabetes mellitus: Secondary | ICD-10-CM | POA: Diagnosis not present

## 2021-04-27 DIAGNOSIS — R29704 NIHSS score 4: Secondary | ICD-10-CM | POA: Diagnosis present

## 2021-04-27 DIAGNOSIS — R29818 Other symptoms and signs involving the nervous system: Secondary | ICD-10-CM | POA: Diagnosis not present

## 2021-04-27 DIAGNOSIS — I671 Cerebral aneurysm, nonruptured: Secondary | ICD-10-CM | POA: Diagnosis not present

## 2021-04-27 DIAGNOSIS — I6381 Other cerebral infarction due to occlusion or stenosis of small artery: Principal | ICD-10-CM | POA: Diagnosis present

## 2021-04-27 DIAGNOSIS — Z79899 Other long term (current) drug therapy: Secondary | ICD-10-CM

## 2021-04-27 DIAGNOSIS — I651 Occlusion and stenosis of basilar artery: Secondary | ICD-10-CM | POA: Diagnosis not present

## 2021-04-27 DIAGNOSIS — I6502 Occlusion and stenosis of left vertebral artery: Secondary | ICD-10-CM | POA: Diagnosis not present

## 2021-04-27 DIAGNOSIS — R200 Anesthesia of skin: Secondary | ICD-10-CM

## 2021-04-27 DIAGNOSIS — E785 Hyperlipidemia, unspecified: Secondary | ICD-10-CM | POA: Diagnosis not present

## 2021-04-27 DIAGNOSIS — R29810 Facial weakness: Secondary | ICD-10-CM | POA: Diagnosis present

## 2021-04-27 DIAGNOSIS — N400 Enlarged prostate without lower urinary tract symptoms: Secondary | ICD-10-CM | POA: Diagnosis present

## 2021-04-27 DIAGNOSIS — M79604 Pain in right leg: Secondary | ICD-10-CM

## 2021-04-27 DIAGNOSIS — Z87891 Personal history of nicotine dependence: Secondary | ICD-10-CM

## 2021-04-27 DIAGNOSIS — G936 Cerebral edema: Secondary | ICD-10-CM | POA: Diagnosis present

## 2021-04-27 DIAGNOSIS — D751 Secondary polycythemia: Secondary | ICD-10-CM | POA: Diagnosis present

## 2021-04-27 LAB — ECHOCARDIOGRAM COMPLETE BUBBLE STUDY
AR max vel: 2.39 cm2
AV Area VTI: 2.18 cm2
AV Area mean vel: 2.24 cm2
AV Mean grad: 3 mmHg
AV Peak grad: 5.5 mmHg
Ao pk vel: 1.17 m/s
Area-P 1/2: 4.17 cm2
S' Lateral: 2.4 cm

## 2021-04-27 LAB — LIPID PANEL
Cholesterol: 159 mg/dL (ref 0–200)
HDL: 29 mg/dL — ABNORMAL LOW (ref >40)
LDL Cholesterol: 120 mg/dL — ABNORMAL HIGH (ref 0–99)
Total CHOL/HDL Ratio: 5.5 RATIO
Triglycerides: 52 mg/dL (ref <150)
VLDL: 10 mg/dL (ref 0–40)

## 2021-04-27 LAB — CBG MONITORING, ED: Glucose-Capillary: 98 mg/dL (ref 70–99)

## 2021-04-27 LAB — SAVE SMEAR(SSMR), FOR PROVIDER SLIDE REVIEW

## 2021-04-27 LAB — I-STAT CHEM 8, ED
BUN: 11 mg/dL (ref 8–23)
Calcium, Ion: 1.18 mmol/L (ref 1.15–1.40)
Chloride: 105 mmol/L (ref 98–111)
Creatinine, Ser: 0.8 mg/dL (ref 0.61–1.24)
Glucose, Bld: 86 mg/dL (ref 70–99)
HCT: 55 % — ABNORMAL HIGH (ref 39.0–52.0)
Hemoglobin: 18.7 g/dL — ABNORMAL HIGH (ref 13.0–17.0)
Potassium: 3.3 mmol/L — ABNORMAL LOW (ref 3.5–5.1)
Sodium: 142 mmol/L (ref 135–145)
TCO2: 27 mmol/L (ref 22–32)

## 2021-04-27 LAB — COMPREHENSIVE METABOLIC PANEL
ALT: 13 U/L (ref 0–44)
AST: 20 U/L (ref 15–41)
Albumin: 3.9 g/dL (ref 3.5–5.0)
Alkaline Phosphatase: 127 U/L — ABNORMAL HIGH (ref 38–126)
Anion gap: 9 (ref 5–15)
BUN: 9 mg/dL (ref 8–23)
CO2: 25 mmol/L (ref 22–32)
Calcium: 9.2 mg/dL (ref 8.9–10.3)
Chloride: 105 mmol/L (ref 98–111)
Creatinine, Ser: 0.91 mg/dL (ref 0.61–1.24)
GFR, Estimated: >60 mL/min (ref >60)
Glucose, Bld: 94 mg/dL (ref 70–99)
Potassium: 3.4 mmol/L — ABNORMAL LOW (ref 3.5–5.1)
Sodium: 139 mmol/L (ref 135–145)
Total Bilirubin: 2 mg/dL — ABNORMAL HIGH (ref 0.3–1.2)
Total Protein: 7.4 g/dL (ref 6.5–8.1)

## 2021-04-27 LAB — DIFFERENTIAL
Abs Immature Granulocytes: 0.02 10*3/uL (ref 0.00–0.07)
Basophils Absolute: 0 10*3/uL (ref 0.0–0.1)
Basophils Relative: 1 %
Eosinophils Absolute: 0 10*3/uL (ref 0.0–0.5)
Eosinophils Relative: 0 %
Immature Granulocytes: 0 %
Lymphocytes Relative: 25 %
Lymphs Abs: 2 10*3/uL (ref 0.7–4.0)
Monocytes Absolute: 0.6 10*3/uL (ref 0.1–1.0)
Monocytes Relative: 7 %
Neutro Abs: 5.5 10*3/uL (ref 1.7–7.7)
Neutrophils Relative %: 67 %

## 2021-04-27 LAB — HEMOGLOBIN A1C
Hgb A1c MFr Bld: 4.9 % (ref 4.8–5.6)
Mean Plasma Glucose: 93.93 mg/dL

## 2021-04-27 LAB — RESP PANEL BY RT-PCR (FLU A&B, COVID) ARPGX2
Influenza A by PCR: NEGATIVE
Influenza B by PCR: NEGATIVE
SARS Coronavirus 2 by RT PCR: NEGATIVE

## 2021-04-27 LAB — CBC
HCT: 54.4 % — ABNORMAL HIGH (ref 39.0–52.0)
Hemoglobin: 18.1 g/dL — ABNORMAL HIGH (ref 13.0–17.0)
MCH: 27.6 pg (ref 26.0–34.0)
MCHC: 33.3 g/dL (ref 30.0–36.0)
MCV: 82.9 fL (ref 80.0–100.0)
Platelets: 270 10*3/uL (ref 150–400)
RBC: 6.56 MIL/uL — ABNORMAL HIGH (ref 4.22–5.81)
RDW: 13.8 % (ref 11.5–15.5)
WBC: 8.2 10*3/uL (ref 4.0–10.5)
nRBC: 0 % (ref 0.0–0.2)

## 2021-04-27 LAB — PROTIME-INR
INR: 1 (ref 0.8–1.2)
Prothrombin Time: 12.9 seconds (ref 11.4–15.2)

## 2021-04-27 LAB — APTT: aPTT: 35 seconds (ref 24–36)

## 2021-04-27 MED ORDER — IOHEXOL 350 MG/ML SOLN
75.0000 mL | Freq: Once | INTRAVENOUS | Status: AC | PRN
  Administered 2021-04-27: 75 mL via INTRAVENOUS

## 2021-04-27 MED ORDER — ACETAMINOPHEN 160 MG/5ML PO SOLN: 650.0000 mg | ORAL | Status: DC | PRN

## 2021-04-27 MED ORDER — PANTOPRAZOLE SODIUM 40 MG PO TBEC
40.0000 mg | DELAYED_RELEASE_TABLET | Freq: Every day | ORAL | Status: DC
  Administered 2021-04-27 – 2021-04-29 (×3): 40 mg via ORAL
  Filled 2021-04-27 (×3): qty 1

## 2021-04-27 MED ORDER — SODIUM CHLORIDE 0.9% FLUSH
3.0000 mL | Freq: Once | INTRAVENOUS | Status: AC
  Administered 2021-04-27: 3 mL via INTRAVENOUS

## 2021-04-27 MED ORDER — TAMSULOSIN HCL 0.4 MG PO CAPS
0.4000 mg | ORAL_CAPSULE | Freq: Every day | ORAL | Status: DC
Start: 2021-04-27 — End: 2021-04-29
  Administered 2021-04-27 – 2021-04-29 (×3): 0.4 mg via ORAL
  Filled 2021-04-27 (×3): qty 1

## 2021-04-27 MED ORDER — ASPIRIN 325 MG PO TABS
325.0000 mg | ORAL_TABLET | Freq: Once | ORAL | Status: AC
  Administered 2021-04-27: 325 mg via ORAL
  Filled 2021-04-27: qty 1

## 2021-04-27 MED ORDER — ENOXAPARIN SODIUM 40 MG/0.4ML IJ SOSY
40.0000 mg | PREFILLED_SYRINGE | INTRAMUSCULAR | Status: DC
  Administered 2021-04-27 – 2021-04-28 (×2): 40 mg via SUBCUTANEOUS
  Filled 2021-04-27 (×2): qty 0.4

## 2021-04-27 MED ORDER — ACETAMINOPHEN 650 MG RE SUPP: 650.0000 mg | RECTAL | Status: DC | PRN

## 2021-04-27 MED ORDER — ASPIRIN EC 81 MG PO TBEC
81.0000 mg | DELAYED_RELEASE_TABLET | Freq: Every day | ORAL | Status: DC
Start: 2021-04-28 — End: 2021-04-29
  Administered 2021-04-28 – 2021-04-29 (×2): 81 mg via ORAL
  Filled 2021-04-27 (×2): qty 1

## 2021-04-27 MED ORDER — CLOPIDOGREL BISULFATE 75 MG PO TABS
75.0000 mg | ORAL_TABLET | Freq: Every day | ORAL | Status: DC
  Administered 2021-04-28 – 2021-04-29 (×2): 75 mg via ORAL
  Filled 2021-04-27 (×2): qty 1

## 2021-04-27 MED ORDER — STROKE: EARLY STAGES OF RECOVERY BOOK
Freq: Once | Status: AC
  Administered 2021-04-27: 1
  Filled 2021-04-27: qty 1

## 2021-04-27 MED ORDER — ACETAMINOPHEN 325 MG PO TABS
650.0000 mg | ORAL_TABLET | ORAL | Status: DC | PRN
  Administered 2021-04-27: 650 mg via ORAL
  Filled 2021-04-27: qty 2

## 2021-04-27 MED ORDER — AMLODIPINE BESYLATE 5 MG PO TABS
5.0000 mg | ORAL_TABLET | Freq: Every day | ORAL | Status: DC
  Administered 2021-04-27 – 2021-04-29 (×3): 5 mg via ORAL
  Filled 2021-04-27 (×3): qty 1

## 2021-04-27 NOTE — Consult Note (Signed)
Neurology Consultation ? ?Reason for Consult: MRI brain with acute left frontoparietal infarction  ?Referring Physician: Dr. Sherry Ruffing ? ?CC: Right-sided weakness ? ?History is obtained from: Patient, chart review ? ?HPI: Aaron Dennis is a 66 y.o. male with a medical history significant for essential hypertension, GERD, coronary calcifications, and polycythemia who presented to the ED 3/19 for evaluation of 5 days of right-sided weakness. Patient states that he first noticed right-sided weakness on Tuesday 3/14 when he was in [place] and he noticed that he was unable to open a door. He states that he was assessed and his blood pressure and glucose were checked with some elevation but was discharged. He left [place] and got on an airplane to [place] when his symptoms worsened again and he was evaluated again and again discharged. Due to persistent right-sided weakness and dragging his right leg with ambulation, the patient came to the ED today for further evaluation. MRI imaging was obtained revealing an acute infarction in the left frontoparietal lobe with associated edema without mass effect and neurology was consulted for further evaluation.  ? ?LKW: Tuesday 3/14 ?TNK given?: no, patient presented well outside of the thrombolytic therapy time window ?IR Thrombectomy? No, presentation is not concerning for an LVO. Also, last known well time is about 5 days prior to hospital arrival.  ?Modified Rankin Scale: 0-Completely asymptomatic and back to baseline post- stroke ? ?ROS: A complete ROS was performed and is negative except as noted in the HPI.  ? ?Past Medical History:  ?Diagnosis Date  ? GERD (gastroesophageal reflux disease)   ? Hypertension   ? Lung nodule   ? 7 mm LUL nodule  ?History reviewed. No pertinent surgical history. ? ?Family History  ?Problem Relation Age of Onset  ? Healthy Mother   ? Diabetes Brother   ? Colon polyps Neg Hx   ? Colon cancer Neg Hx   ? Esophageal cancer Neg Hx   ? Rectal cancer Neg Hx    ? Stomach cancer Neg Hx   ? ?Social History:  ? reports that he has quit smoking. He has never used smokeless tobacco. He reports that he does not drink alcohol and does not use drugs. ? ?Medications ?No current facility-administered medications for this encounter. ? ?Current Outpatient Medications:  ?  acetaminophen (TYLENOL) 500 MG tablet, Take 1,000 mg by mouth every 6 (six) hours as needed for mild pain or headache., Disp: , Rfl:  ?  amLODipine (NORVASC) 5 MG tablet, Take 1 tablet (5 mg total) by mouth daily., Disp: 90 tablet, Rfl: 1 ?  finasteride (PROSCAR) 5 MG tablet, Take 5 mg by mouth daily., Disp: , Rfl:  ?  meloxicam (MOBIC) 7.5 MG tablet, Take 1 tablet (7.5 mg total) by mouth daily., Disp: 90 tablet, Rfl: 1 ?  omeprazole (PRILOSEC) 40 MG capsule, Take 1 capsule (40 mg total) by mouth daily., Disp: 30 capsule, Rfl: 2 ?  tamsulosin (FLOMAX) 0.4 MG CAPS capsule, Take 0.4 mg by mouth daily., Disp: , Rfl:  ?  atorvastatin (LIPITOR) 10 MG tablet, Take 1 tablet (10 mg total) by mouth daily. (Patient not taking: Reported on 04/27/2021), Disp: 90 tablet, Rfl: 1 ?  baclofen (LIORESAL) 10 MG tablet, Take 0.5-1 tablets (5-10 mg total) by mouth at bedtime as needed for muscle spasms. (Patient not taking: Reported on 10/31/2020), Disp: 90 tablet, Rfl: 1 ?  ergocalciferol (DRISDOL) 1.25 MG (50000 UT) capsule, Take 1 capsule (50,000 Units total) by mouth once a week. (Patient not taking: Reported  on 04/27/2021), Disp: 12 capsule, Rfl: 1 ?  hydrocortisone-pramoxine (ANALPRAM HC) 2.5-1 % rectal cream, Place 1 application rectally 3 (three) times daily. (Patient not taking: Reported on 04/27/2021), Disp: 30 g, Rfl: 1 ?  meclizine (ANTIVERT) 25 MG tablet, Take 1 tablet (25 mg total) by mouth 2 (two) times daily as needed for dizziness. (Patient not taking: Reported on 10/31/2020), Disp: 60 tablet, Rfl: 1 ?  polyethylene glycol powder (GLYCOLAX/MIRALAX) 17 GM/SCOOP powder, Take 17 g by mouth daily. (Patient not taking: Reported  on 04/27/2021), Disp: 3350 g, Rfl: 1 ?  vitamin B-12 (CYANOCOBALAMIN) 1000 MCG tablet, Take 1 tablet (1,000 mcg total) by mouth daily. (Patient not taking: Reported on 04/27/2021), Disp: 90 tablet, Rfl: 1 ? ?Exam: ?Current vital signs: ?BP (!) 131/96   Pulse 80   Temp (!) 97.5 ?F (36.4 ?C) (Oral)   Resp 18   Ht '5\' 10"'$  (1.778 m)   Wt 85.7 kg   SpO2 100%   BMI 27.12 kg/m?  ?Vital signs in last 24 hours: ?Temp:  [97.5 ?F (36.4 ?C)] 97.5 ?F (36.4 ?C) (03/19 1002) ?Pulse Rate:  [72-84] 80 (03/19 1519) ?Resp:  [16-23] 18 (03/19 1519) ?BP: (131-153)/(87-99) 131/96 (03/19 1519) ?SpO2:  [98 %-100 %] 100 % (03/19 1519) ?Weight:  [85.7 kg] 85.7 kg (03/19 1101) ? ?GENERAL: Awake, alert, in no acute distress ?Psych: Affect appropriate for situation, patient is calm and cooperative with examination ?Head: Normocephalic and atraumatic, without obvious abnormality ?EENT: Normal conjunctivae, dry mucous membranes, no OP obstruction ?LUNGS: Normal respiratory effort. Non-labored breathing on room air ?CV: Regular rate and rhythm on telemetry ?ABDOMEN: Soft, non-tender, non-distended ?Extremities: Warm, well perfused, without obvious deformity ? ?NEURO:  ?Mental Status: Awake, alert, and oriented to person, place, time, and situation. ?He is able to provide a clear and coherent history of present illness. He does intermittently prefer to use friend at bedside to interpret during conversation.  ?Speech/Language: speech is fluent without evidence of dysarthria or aphasia.    ?No neglect is noted ?Cranial Nerves:  ?II: PERRL 3 mm/brisk. Visual fields full.  ?III, IV, VI: EOMI without gaze preference, nystagmus, or ptosis ?V: Sensation is intact to light touch and symmetrical to face.  ?VII: Face is symmetric resting and smiling.   ?VIII: Hearing is intact to voice ?IX, X: Palate elevation is symmetric. Phonation normal.  ?XI: Normal sternocleidomastoid and trapezius muscle strength ?XII: Tongue protrudes midline without  fasciculations.   ?Motor: 5/5 strength present in left upper and lower extremities.  ?He has extensor greater than flexor weakness of the left arm and leg, 3-4/5 ?Sensation: Intact to light touch bilaterally in all four extremities. States that earlier today, he felt that there may have been decreased sensation to the right upper extremity but on assessment with neurology NP, patient denies sensory deficit.  ?Coordination: FTN intact bilaterally. HKS intact bilaterally. No pronator drift. ?DTRs: 2+ and symmetric throughout.  ?Gait: Deferred for patient safety ? ?NIHSS: ?1a Level of Conscious.: 0 ?1b LOC Questions: 0 ?1c LOC Commands: 0 ?2 Best Gaze: 0 ?3 Visual: 0 ?4 Facial Palsy: 0 ?5a Motor Arm - left: 0 ?5b Motor Arm - Right: 2 ?6a Motor Leg - Left: 0 ?6b Motor Leg - Right: 2 ?7 Limb Ataxia: 0 ?8 Sensory: 0 ?9 Best Language: 0 ?10 Dysarthria: 0 ?11 Extinct. and Inatten.: 0 ?TOTAL: 4 ? ?Labs ?I have reviewed labs in epic and the results pertinent to this consultation are: ?CBC ?   ?Component Value Date/Time  ?  WBC 8.2 04/27/2021 1012  ? RBC 6.56 (H) 04/27/2021 1012  ? HGB 18.7 (H) 04/27/2021 1124  ? HGB 17.8 (H) 06/26/2020 0944  ? HGB 16.5 03/13/2009 0931  ? HCT 55.0 (H) 04/27/2021 1124  ? HCT 54.5 (H) 06/26/2020 0944  ? HCT 49.8 03/13/2009 0931  ? PLT 270 04/27/2021 1012  ? PLT 181 06/26/2020 0944  ? MCV 82.9 04/27/2021 1012  ? MCV 83 06/26/2020 0944  ? MCV 82.2 03/13/2009 0931  ? MCH 27.6 04/27/2021 1012  ? MCHC 33.3 04/27/2021 1012  ? RDW 13.8 04/27/2021 1012  ? RDW 16.3 (H) 06/26/2020 0944  ? RDW 15.5 (H) 03/13/2009 0931  ? LYMPHSABS 2.0 04/27/2021 1012  ? LYMPHSABS 2.2 06/26/2020 0944  ? LYMPHSABS 2.2 03/13/2009 0931  ? MONOABS 0.6 04/27/2021 1012  ? MONOABS 0.5 03/13/2009 0931  ? EOSABS 0.0 04/27/2021 1012  ? EOSABS 0.0 06/26/2020 0944  ? BASOSABS 0.0 04/27/2021 1012  ? BASOSABS 0.0 06/26/2020 0944  ? BASOSABS 0.0 03/13/2009 0931  ? ?CMP  ?   ?Component Value Date/Time  ? NA 142 04/27/2021 1124  ? NA 139  06/26/2020 0944  ? K 3.3 (L) 04/27/2021 1124  ? CL 105 04/27/2021 1124  ? CO2 25 04/27/2021 1012  ? GLUCOSE 86 04/27/2021 1124  ? BUN 11 04/27/2021 1124  ? BUN 19 06/26/2020 0944  ? CREATININE 0.80 04/27/2021 1124  ? CREATI

## 2021-04-27 NOTE — ED Notes (Signed)
Pt returns from MRI ° °

## 2021-04-27 NOTE — ED Provider Notes (Signed)
[facility] EMERGENCY DEPARTMENT Provider Note   CSN: [id number] Arrival date & time: 04/27/21  4696     History  Chief Complaint  Patient presents with   Weakness    Aaron Dennis is a 66 y.o. male.  The history is provided by the patient and medical records. No language interpreter was used.  Weakness Severity:  Moderate Onset quality:  Gradual Duration:  5 days Timing:  Constant Progression:  Waxing and waning Chronicity:  New Relieved by:  Nothing Worsened by:  Nothing Ineffective treatments:  None tried Associated symptoms: extremity numbness   Associated symptoms: no abdominal pain, no aphasia, no chest pain, no cough, no diarrhea, no dizziness, no dysuria, no falls, no fever, no headaches, no lethargy, no loss of consciousness, no nausea, no shortness of breath, no syncope and no vomiting       Home Medications Prior to Admission medications   Medication Sig Start Date End Date Taking? Authorizing Provider  acetaminophen (TYLENOL) 500 MG tablet Take 1,000 mg by mouth every 6 (six) hours as needed for mild pain or headache.   Yes [provider]  amLODipine (NORVASC) 5 MG tablet Take 1 tablet (5 mg total) by mouth daily. 10/31/20 10/31/21 Yes [name], MD  finasteride (PROSCAR) 5 MG tablet Take 5 mg by mouth daily.   Yes [provider]  meloxicam (MOBIC) 7.5 MG tablet Take 1 tablet (7.5 mg total) by mouth daily. 08/14/20  Yes [name], MD  omeprazole (PRILOSEC) 40 MG capsule Take 1 capsule (40 mg total) by mouth daily. 10/18/20 04/27/21 Yes [name], MD  tamsulosin (FLOMAX) 0.4 MG CAPS capsule Take 0.4 mg by mouth daily.   Yes [provider]  atorvastatin (LIPITOR) 10 MG tablet Take 1 tablet (10 mg total) by mouth daily. Patient not taking: Reported on 04/27/2021 08/14/20   [name], MD  baclofen (LIORESAL) 10 MG tablet Take 0.5-1 tablets (5-10 mg total) by mouth at bedtime as needed for muscle  spasms. Patient not taking: Reported on 10/31/2020 08/14/20   [name], MD  ergocalciferol (DRISDOL) 1.25 MG (50000 UT) capsule Take 1 capsule (50,000 Units total) by mouth once a week. Patient not taking: Reported on 04/27/2021 10/31/20   [name], MD  hydrocortisone-pramoxine (ANALPRAM HC) 2.5-1 % rectal cream Place 1 application rectally 3 (three) times daily. Patient not taking: Reported on 04/27/2021 10/31/20   [name], MD  meclizine (ANTIVERT) 25 MG tablet Take 1 tablet (25 mg total) by mouth 2 (two) times daily as needed for dizziness. Patient not taking: Reported on 10/31/2020 08/14/20   [name], MD  polyethylene glycol powder (GLYCOLAX/MIRALAX) 17 GM/SCOOP powder Take 17 g by mouth daily. Patient not taking: Reported on 04/27/2021 10/31/20   [name], MD  vitamin B-12 (CYANOCOBALAMIN) 1000 MCG tablet Take 1 tablet (1,000 mcg total) by mouth daily. Patient not taking: Reported on 04/27/2021 08/15/20   [name], MD      Allergies    Patient has no known allergies.    Review of Systems   Review of Systems  Constitutional:  Negative for chills, fatigue and fever.  HENT:  Negative for congestion.   Eyes:  Negative for visual disturbance.  Respiratory:  Negative for cough, chest tightness, shortness of breath and wheezing.   Cardiovascular:  Negative for chest pain, palpitations and syncope.  Gastrointestinal:  Negative for abdominal pain, constipation, diarrhea, nausea and vomiting.  Genitourinary:  Negative for dysuria and flank pain.  Musculoskeletal:  Negative for back pain, falls, neck pain and neck stiffness.  Skin:  Negative for rash and wound.  Neurological:  Positive for weakness and numbness. Negative for dizziness, loss of consciousness, light-headedness and headaches.  Psychiatric/Behavioral:  Negative for agitation and confusion.   All other systems reviewed and are negative.  Physical Exam Updated Vital Signs BP (!) 146/99 (BP Location:  Left Arm)   Pulse 75   Temp (!) 97.5 F (36.4 C) (Oral)   Resp (!) 23   Ht 5\' 10"  (1.778 m)   Wt 85.7 kg   SpO2 100%   BMI 27.12 kg/m  Physical Exam Vitals and nursing note reviewed.  Constitutional:      General: He is not in acute distress.    Appearance: He is well-developed. He is not ill-appearing, toxic-appearing or diaphoretic.  HENT:     Head: Normocephalic and atraumatic.     Nose: No congestion or rhinorrhea.     Mouth/Throat:     Mouth: Mucous membranes are moist.     Pharynx: No oropharyngeal exudate or posterior oropharyngeal erythema.  Eyes:     Extraocular Movements: Extraocular movements intact.     Conjunctiva/sclera: Conjunctivae normal.     Pupils: Pupils are equal, round, and reactive to light.  Cardiovascular:     Rate and Rhythm: Normal rate and regular rhythm.     Heart sounds: No murmur heard. Pulmonary:     Effort: Pulmonary effort is normal. No respiratory distress.     Breath sounds: Normal breath sounds. No wheezing, rhonchi or rales.  Chest:     Chest wall: No tenderness.  Abdominal:     General: Abdomen is flat.     Palpations: Abdomen is soft.     Tenderness: There is no abdominal tenderness. There is no right CVA tenderness, left CVA tenderness, guarding or rebound.  Musculoskeletal:        General: No swelling or tenderness.     Cervical back: Neck supple. No tenderness.     Right lower leg: No edema.     Left lower leg: No edema.  Skin:    General: Skin is warm and dry.     Capillary Refill: Capillary refill takes less than 2 seconds.     Findings: No erythema.  Neurological:     Mental Status: He is alert.     Sensory: Sensory deficit present.     Motor: Weakness present.     Comments: Right arm numbness compared to left.  Right arm and right leg weakness compared to left.   Patient cannot raise right arm up enough to do finger-nose-finger testing  Psychiatric:        Mood and Affect: Mood normal.    ED Results / Procedures /  Treatments   Labs (all labs ordered are listed, but only abnormal results are displayed) Labs Reviewed  CBC - Abnormal; Notable for the following components:      Result Value   RBC 6.56 (*)    Hemoglobin 18.1 (*)    HCT 54.4 (*)    All other components within normal limits  COMPREHENSIVE METABOLIC PANEL - Abnormal; Notable for the following components:   Potassium 3.4 (*)    Alkaline Phosphatase 127 (*)    Total Bilirubin 2.0 (*)    All other components within normal limits  I-STAT CHEM 8, ED - Abnormal; Notable for the following components:   Potassium 3.3 (*)    Hemoglobin 18.7 (*)    HCT 55.0 (*)  All other components within normal limits  PROTIME-INR  APTT  DIFFERENTIAL  CBG MONITORING, ED  CBG MONITORING, ED    EKG EKG Interpretation  Date/Time:  Sunday April 27 2021 10:05:33 EDT Ventricular Rate:  82 PR Interval:  158 QRS Duration: 84 QT Interval:  368 QTC Calculation: 429 R Axis:   90 Text Interpretation: Normal sinus rhythm Rightward axis Borderline ECG When compared with ECG of 12-Jul-2017 16:49, PREVIOUS ECG IS PRESENT similar to prior. No STEMI Confirmed by Theda Belfast ([id number]) on 04/27/2021 11:20:24 AM  Radiology CT HEAD WO CONTRAST  Result Date: 04/27/2021 CLINICAL DATA:  Neurological deficit, right-sided weakness EXAM: CT HEAD WITHOUT CONTRAST TECHNIQUE: Contiguous axial images were obtained from the base of the skull through the vertex without intravenous contrast. RADIATION DOSE REDUCTION: This exam was performed according to the departmental dose-optimization program which includes automated exposure control, adjustment of the mA and/or kV according to patient size and/or use of iterative reconstruction technique. COMPARISON:  07/12/2017 FINDINGS: Brain: No acute intracranial findings are seen. Cortical sulci are prominent. There is decreased density in periventricular and subcortical white matter. There are small foci of decreased density in the basal  ganglia on both sides. There is no focal mass effect. There is no shift of midline structures. Vascular: Unremarkable. Skull: Unremarkable. Sinuses/Orbits: There is possible old blowout fracture in the medial wall of left orbit. Other: None IMPRESSION: No acute intracranial findings are seen in the noncontrast CT brain. Atrophy. Small-vessel disease. Electronically Signed   By: Ernie Avena M.D.   On: 04/27/2021 10:51   MR BRAIN WO CONTRAST  Result Date: 04/27/2021 CLINICAL DATA:  Neuro deficit, acute, stroke suspected EXAM: MRI HEAD WITHOUT CONTRAST TECHNIQUE: Multiplanar, multiecho pulse sequences of the brain and surrounding structures were obtained without intravenous contrast. COMPARISON:  Same day head CT. FINDINGS: Brain: Acute infarct in the left frontoparietal white matter. Mild associated edema without mass effect. Additional scattered patchy T2/FLAIR hyperintensities within the white matter. Small foci of encephalomalacia in the periatrial white matter bilaterally. Cerebral atrophy no evidence of acute hemorrhage, mass lesion, midline shift or extra-axial fluid collection. Vascular: Major arterial flow voids are maintained at the skull base. Skull and upper cervical spine: Diffuse T1 hypointensity the bone marrow without focal marrow replacing lesion. Sinuses/Orbits: Remote left medial orbital wall fracture. Clear sinuses. Other: No mastoid effusions. IMPRESSION: 1. Acute infarct in the left frontoparietal white matter. Mild associated edema without mass effect. 2. Additional moderate scattered T2/FLAIR hyperintensities the white matter with small foci of encephalomalacia in the periatrial white matter bilateral. Findings are nonspecific but most likely related to chronic microvascular disease. Chronic demyelination is a less likely differential consideration. 3. Diffuse T1 hypointensity of the bone marrow. This finding is nonspecific, but can be seen with chronic anemia, chronic hypoxia, or  underlying lymphoproliferative disorder. Electronically Signed   By: Feliberto Harts M.D.   On: 04/27/2021 14:55   VAS [place] LOWER EXTREMITY VENOUS (DVT) (ONLY [facility] & [facility])  Result Date: 04/27/2021  Lower Venous DVT Study Patient Name:  JOH DROGE Heider  Date of Exam:   04/27/2021 Medical Rec #: [id number]      Accession #:    [id number] Date of Birth: 05/19/1955       Patient Gender: M Patient Age:   66 years Exam Location:  [facility] Procedure:      VAS [place] LOWER EXTREMITY VENOUS (DVT) Referring Phys: Lynden Oxford --------------------------------------------------------------------------------  Indications: Pain.  Comparison Study: Prior negative bilateral LEV done  11/25/20 Performing Technologist: Sherren Kerns RVS  Examination Guidelines: A complete evaluation includes B-mode imaging, spectral Doppler, color Doppler, and power Doppler as needed of all accessible portions of each vessel. Bilateral testing is considered an integral part of a complete examination. Limited examinations for reoccurring indications may be performed as noted. The reflux portion of the exam is performed with the patient in reverse Trendelenburg.  +---------+---------------+---------+-----------+----------+--------------+ RIGHT    CompressibilityPhasicitySpontaneityPropertiesThrombus Aging +---------+---------------+---------+-----------+----------+--------------+ CFV      Full           Yes      Yes                                 +---------+---------------+---------+-----------+----------+--------------+ SFJ      Full                                                        +---------+---------------+---------+-----------+----------+--------------+ FV Prox  Full                                                        +---------+---------------+---------+-----------+----------+--------------+ FV Mid   Full                                                         +---------+---------------+---------+-----------+----------+--------------+ FV DistalFull                                                        +---------+---------------+---------+-----------+----------+--------------+ PFV      Full                                                        +---------+---------------+---------+-----------+----------+--------------+ POP      Full           Yes      Yes                                 +---------+---------------+---------+-----------+----------+--------------+ PTV      Full                                                        +---------+---------------+---------+-----------+----------+--------------+ PERO     Full                                                        +---------+---------------+---------+-----------+----------+--------------+   +----+---------------+---------+-----------+----------+--------------+  LEFTCompressibilityPhasicitySpontaneityPropertiesThrombus Aging +----+---------------+---------+-----------+----------+--------------+ CFV Full           Yes      Yes                                 +----+---------------+---------+-----------+----------+--------------+     Summary: RIGHT: - There is no evidence of deep vein thrombosis in the lower extremity.  - No cystic structure found in the popliteal fossa.  LEFT: - No evidence of common femoral vein obstruction.  *See table(s) above for measurements and observations.    Preliminary     Procedures Procedures    Medications Ordered in ED Medications  sodium chloride flush (NS) 0.9 % injection 3 mL (3 mLs Intravenous Given 04/27/21 1113)    ED Course/ Medical Decision Making/ A&P                           Medical Decision Making Amount and/or Complexity of Data Reviewed Labs: ordered. Radiology: ordered.    SAMYAK GIELOW is a 66 y.o. male with a past medical history significant for coronary calcifications, GERD, hypertension, and  polycythemia who presents with neurologic deficit and right calf pain.  According to patient, 5 days ago, patient had onset of right arm and right leg weakness.  He reports he was in [place] and had difficulty opening a door.  He reports that the symptoms waxed and waned but he was evaluated before traveling and said that his glucose and blood pressures were elevated but otherwise no concerning findings.  He then had symptoms worsen while on the plane to [place] and again was reassured.  He then was evaluated in [place] and was reassured however he said for the last few days it has worsened and been persistent where he has weakness in his right him and right leg compared to left which is never had before.  He reports some numbness in the right arm but no numbness in the legs.  He reports the symptoms are new and he is concerned about stroke.  Denies any history of stroke.  Denies history of PFO or paradoxical stroke.  No history of DVT or PE.  No vision changes, speech difficulties, nausea, vomiting, or other complaints.  On exam, patient does have some tenderness in the right calf but otherwise has intact pulses and appearance of the legs.  Patient does have subtle weakness in right leg.  Left and has weakness in right arm compared to left.  Patient did have a right arm drift compared to left.  He had some decree sensation right arm compared to left.  Otherwise pupils symmetric and reactive normal extract movements.  Clear speech.  Symmetric smile.  No facial abnormality seen.  Clinically I am concerned the patient had stroke or even a paradoxical stroke if he had a DVT and PFO.  We will get right leg ultrasound but will also get MRI.  Patient had a CT in triage that did not show evidence of acute stroke.  Anticipate reassessment after MRI to determine disposition.  3:14 PM MRI reveals acute stroke.  Spoke to neurology who will see him and they request admission for further management.        Final  Clinical Impression(s) / ED Diagnoses Final diagnoses:  Right sided weakness  Right arm numbness  Cerebrovascular accident (CVA), unspecified mechanism (HCC)     Clinical Impression: 1. Right sided weakness  2. Right arm numbness   3. Cerebrovascular accident (CVA), unspecified mechanism (HCC)     Disposition: Admit  This note was prepared with assistance of Dragon voice recognition software. Occasional wrong-word or sound-a-like substitutions may have occurred due to the inherent limitations of voice recognition software.      Domingue Coltrain, Canary Brim, MD 04/27/21 1538

## 2021-04-27 NOTE — ED Notes (Signed)
ED TO INPATIENT HANDOFF REPORT ? ?ED Nurse Name and Phone #: [phone] ? ?S ?Name/Age/Gender ?Aaron Dennis ?66 y.o. ?male ?Room/Bed: 023C/023C ? ?Code Status ?  Code Status: Full Code ? ?Home/SNF/Other ?Home ?Patient oriented to: self, place, time, and situation ?Is this baseline? Yes  ? ?Triage Complete: Triage complete  ?Chief Complaint ?Acute CVA (cerebrovascular accident) ([place]) [I63.9] ? ?Triage Note ?Patient here with complaint of right sided weakness that started suddenly on Thursday night. Patient has no dysarthria, slight right sided facial droop, decreased sensation in right leg and arm but normal sensation in face, right arm drifts but does not fall to wheelchair before ten seconds, patient is alert and oriented.  ? ?Allergies ?No Known Allergies ? ?Level of Care/Admitting Diagnosis ?ED Disposition   ? ? ED Disposition  ?Admit  ? Condition  ?--  ? Comment  ?Hospital Area: [facility] [[id number]] ? Level of Care: Telemetry Medical [[id number]] ? May place patient in observation at [facility] or [place] if equivalent level of care is available:: No ? Covid Evaluation: Asymptomatic - no recent exposure (last 10 days) testing not required ? Diagnosis: Acute CVA (cerebrovascular accident) [facility]) [[id number]] ? Admitting Physician: Lucious Groves [[id number]] ? Attending Physician: Lucious Groves [[id number]] ?  ?  ? ?  ? ? ?B ?Medical/Surgery History ?Past Medical History:  ?Diagnosis Date  ? GERD (gastroesophageal reflux disease)   ? Hypertension   ? Lung nodule   ? 7 mm LUL nodule  ? ?History reviewed. No pertinent surgical history.  ? ?A ?IV Location/Drains/Wounds ?Patient Lines/Drains/Airways Status   ? ? Active Line/Drains/Airways   ? ? Name Placement date Placement time Site Days  ? Peripheral IV 04/27/21 20 G Posterior;Right Forearm 04/27/21  1112  Forearm  less than 1  ? ?  ?  ? ?  ? ? ?Intake/Output Last 24 hours ? ?Intake/Output Summary (Last 24 hours) at 04/27/2021 2108 ?Last data filed at 04/27/2021  1309 ?Gross per 24 hour  ?Intake --  ?Output 200 ml  ?Net -200 ml  ? ? ?Labs/Imaging ?Results for orders placed or performed during the hospital encounter of 04/27/21 (from the past 48 hour(s))  ?CBG monitoring, ED     Status: None  ? Collection Time: 04/27/21 10:01 AM  ?Result Value Ref Range  ? Glucose-Capillary 98 70 - 99 mg/dL  ?  Comment: Glucose reference range applies only to samples taken after fasting for at least 8 hours.  ? Comment 1 Notify RN   ? Comment 2 Document in Chart   ?Protime-INR     Status: None  ? Collection Time: 04/27/21 10:12 AM  ?Result Value Ref Range  ? Prothrombin Time 12.9 11.4 - 15.2 seconds  ? INR 1.0 0.8 - 1.2  ?  Comment: (NOTE) ?INR goal varies based on device and disease states. ?Performed at [facility], [place] [address], [place], [place] ?[address] ?  ?APTT     Status: None  ? Collection Time: 04/27/21 10:12 AM  ?Result Value Ref Range  ? aPTT 35 24 - 36 seconds  ?  Comment: Performed at [facility], [place] [address], [place], [place] [address]  ?CBC     Status: Abnormal  ? Collection Time: 04/27/21 10:12 AM  ?Result Value Ref Range  ? WBC 8.2 4.0 - 10.5 K/uL  ? RBC 6.56 (H) 4.22 - 5.81 MIL/uL  ? Hemoglobin 18.1 (H) 13.0 - 17.0 g/dL  ? HCT 54.4 (H)  39.0 - 52.0 %  ? MCV 82.9 80.0 - 100.0 fL  ? MCH 27.6 26.0 - 34.0 pg  ? MCHC 33.3 30.0 - 36.0 g/dL  ? RDW 13.8 11.5 - 15.5 %  ? Platelets 270 150 - 400 K/uL  ? nRBC 0.0 0.0 - 0.2 %  ?  Comment: Performed at [facility], [place] [address], [place], [place] [address]  ?Differential     Status: None  ? Collection Time: 04/27/21 10:12 AM  ?Result Value Ref Range  ? Neutrophils Relative % 67 %  ? Neutro Abs 5.5 1.7 - 7.7 K/uL  ? Lymphocytes Relative 25 %  ? Lymphs Abs 2.0 0.7 - 4.0 K/uL  ? Monocytes Relative 7 %  ? Monocytes Absolute 0.6 0.1 - 1.0 K/uL  ? Eosinophils Relative 0 %  ? Eosinophils Absolute 0.0 0.0 - 0.5 K/uL  ? Basophils Relative 1 %  ? Basophils Absolute 0.0 0.0 - 0.1 K/uL  ? Immature Granulocytes 0 %  ? Abs  Immature Granulocytes 0.02 0.00 - 0.07 K/uL  ?  Comment: Performed at [facility], [place] [address] [place], [place] [address]  ?Comprehensive metabolic panel     Status: Abnormal  ? Collection Time: 04/27/21 10:12 AM  ?Result Value Ref Range  ? Sodium 139 135 - 145 mmol/L  ? Potassium 3.4 (L) 3.5 - 5.1 mmol/L  ? Chloride 105 98 - 111 mmol/L  ? CO2 25 22 - 32 mmol/L  ? Glucose, Bld 94 70 - 99 mg/dL  ?  Comment: Glucose reference range applies only to samples taken after fasting for at least 8 hours.  ? BUN 9 8 - 23 mg/dL  ? Creatinine, Ser 0.91 0.61 - 1.24 mg/dL  ? Calcium 9.2 8.9 - 10.3 mg/dL  ? Total Protein 7.4 6.5 - 8.1 g/dL  ? Albumin 3.9 3.5 - 5.0 g/dL  ? AST 20 15 - 41 U/L  ? ALT 13 0 - 44 U/L  ? Alkaline Phosphatase 127 (H) 38 - 126 U/L  ? Total Bilirubin 2.0 (H) 0.3 - 1.2 mg/dL  ? GFR, Estimated >60 >60 mL/min  ?  Comment: (NOTE) ?Calculated using the CKD-EPI Creatinine Equation (2021) ?  ? Anion gap 9 5 - 15  ?  Comment: Performed at [facility], [place] [address], [place],  [address]  ?I-stat chem 8, ED     Status: Abnormal  ? Collection Time: 04/27/21 11:24 AM  ?Result Value Ref Range  ? Sodium 142 135 - 145 mmol/L  ? Potassium 3.3 (L) 3.5 - 5.1 mmol/L  ? Chloride 105 98 - 111 mmol/L  ? BUN 11 8 - 23 mg/dL  ? Creatinine, Ser 0.80 0.61 - 1.24 mg/dL  ? Glucose, Bld 86 70 - 99 mg/dL  ?  Comment: Glucose reference range applies only to samples taken after fasting for at least 8 hours.  ? Calcium, Ion 1.18 1.15 - 1.40 mmol/L  ? TCO2 27 22 - 32 mmol/L  ? Hemoglobin 18.7 (H) 13.0 - 17.0 g/dL  ? HCT 55.0 (H) 39.0 - 52.0 %  ?Hemoglobin A1c     Status: None  ? Collection Time: 04/27/21  4:49 PM  ?Result Value Ref Range  ? Hgb A1c MFr Bld 4.9 4.8 - 5.6 %  ?  Comment: (NOTE) ?Pre diabetes:          5.7%-6.4% ? ?Diabetes:              >6.4% ? ?  Glycemic control for   <7.0% ?adults with diabetes ?  ? Mean Plasma Glucose 93.93 mg/dL  ?  Comment: Performed at [facility], [place] [address],  [place], [place] [address]  ?Lipid panel     Status: Abnormal  ? Collection Time: 04/27/21  4:49 PM  ?Result Value Ref Range  ? Cholesterol 159 0 - 200 mg/dL  ? Triglycerides 52 <150 mg/dL  ? HDL 29 (L) >40 mg/dL  ? Total CHOL/HDL Ratio 5.5 RATIO  ? VLDL 10 0 - 40 mg/dL  ? LDL Cholesterol 120 (H) 0 - 99 mg/dL  ?  Comment:        ?Total Cholesterol/HDL:CHD Risk ?Coronary Heart Disease Risk Table ?                    Men   Women ? 1/2 Average Risk   3.4   3.3 ? Average Risk       5.0   4.4 ? 2 X Average Risk   9.6   7.1 ? 3 X Average Risk  23.4   11.0 ?       ?Use the calculated Patient Ratio ?above and the CHD Risk Table ?to determine the patient's CHD Risk. ?       ?ATP III CLASSIFICATION (LDL): ? <100     mg/dL   Optimal ? 100-129  mg/dL   Near or Above ?                   Optimal ? 130-159  mg/dL   Borderline ? 160-189  mg/dL   High ? >190     mg/dL   Very High ?Performed at [facility], [place] [address], [place], [place] [address] ?  ?Save Smear     Status: None  ? Collection Time: 04/27/21  4:49 PM  ?Result Value Ref Range  ? Smear Review SMEAR STAINED AND AVAILABLE FOR REVIEW   ?  Comment: Performed at [facility], [place] [address], [place],  [address]  ?Resp Panel by RT-PCR (Flu A&B, Covid) Nasopharyngeal Swab     Status: None  ? Collection Time: 04/27/21  6:29 PM  ? Specimen: Nasopharyngeal Swab; Nasopharyngeal(NP) swabs in vial transport medium  ?Result Value Ref Range  ? SARS Coronavirus 2 by RT PCR NEGATIVE NEGATIVE  ?  Comment: (NOTE) ?SARS-CoV-2 target nucleic acids are NOT DETECTED. ? ?The SARS-CoV-2 RNA is generally detectable in upper respiratory ?specimens during the acute phase of infection. The lowest ?concentration of SARS-CoV-2 viral copies this assay can detect is ?138 copies/mL. A negative result does not preclude SARS-Cov-2 ?infection and should not be used as the sole basis for treatment or ?other patient management decisions. A negative result may occur with  ?improper specimen  collection/handling, submission of specimen other ?than nasopharyngeal swab, presence of viral mutation(s) within the ?areas targeted by this assay, and inadequate number of viral ?copies(<138 copies/mL). A neg

## 2021-04-27 NOTE — Progress Notes (Signed)
?  Echocardiogram ?2D Echocardiogram has been performed. ? ?Aaron Dennis ?04/27/2021, 5:39 PM ?

## 2021-04-27 NOTE — H&P (Addendum)
? ?Date: 04/27/2021     ?     ?     ?Patient Name:  Aaron Dennis MRN: [id number]  ?DOB: [birth year] Age / Sex: 66 y.o., male   ?PCP: Charlott Rakes, MD    ?     ?Medical Service: Internal Medicine Teaching Service    ?     ?Attending Physician: Dr. Heber [place], Rachel Moulds, DO    ?First Contact: Farrel Gordon, DO Pager: ED [phone]  ?Second Contact: Linwood Dibbles, MD Pager: PA [phone]  ?     ?After Hours (After 5p/  First Contact Pager: [phone]  ?weekends / holidays): Second Contact Pager: [phone]  ? ?SUBJECTIVE  ?Chief Complaint: right sided weakness ? ?History of Present Illness: Aaron Dennis is a 66 y.o. male with a pertinent PMH of hypertension, reflux disease, polycythemia and bilateral knee osteoarthritis, who presents to [facility] with right sided weakness. ? ?Patient is presenting with five days of waxing and waning right sided weakness. Patient first noted the weakness when he was in [place]. He was parking his car and trying to open the garage door when he first noted right arm weakness. His neighbor helped him with the door. He presented to a local clinic that same day and was told his blood pressure was elevated but no further work up was pursued. His symptoms improved over the next couple hours. He noticed similar symptoms on his flight from [place] to [place] a few days ago. Vitals were checked - he was noted to be hypertensive and hyperglycemic. Patient was offered further evaluation upon arrival to the airport in [place]; however, patient refused. He noted that the weakness improved from his flight from [place] to [place] and remained asymptomatic from [place] to [place] airport. He notes reoccurrence of the right sided weakness when he arrived home and notes that he had near-falls in his home. He was helped to a chair by his wife and son. He has some numbness in right upper extremity but none in lower extremity. He denies any headaches, vision changes, speech difficulty, dysphagia, lightheadedness/dizziness,  chest pain, palpitations, decreased appetite or any other concerns at this time. ? ?In the ED, the patient was concerning for stroke for which CT Head wo Contrast and MRI Brain obtained. CT Head negative but MRI did show an acute left frontoparietal stroke. Due to recent travel and concern for paradoxical stroke, DVT US was obtained - negative for DVT. Neurology consulted and patient admitted for further stroke work up.  ? ?Medications: ?No current facility-administered medications on file prior to encounter.  ? ?Current Outpatient Medications on File Prior to Encounter  ?Medication Sig Dispense Refill  ? acetaminophen (TYLENOL) 500 MG tablet Take 1,000 mg by mouth every 6 (six) hours as needed for mild pain or headache.    ? amLODipine (NORVASC) 5 MG tablet Take 1 tablet (5 mg total) by mouth daily. 90 tablet 1  ? finasteride (PROSCAR) 5 MG tablet Take 5 mg by mouth daily.    ? meloxicam (MOBIC) 7.5 MG tablet Take 1 tablet (7.5 mg total) by mouth daily. 90 tablet 1  ? omeprazole (PRILOSEC) 40 MG capsule Take 1 capsule (40 mg total) by mouth daily. 30 capsule 2  ? tamsulosin (FLOMAX) 0.4 MG CAPS capsule Take 0.4 mg by mouth daily.    ? atorvastatin (LIPITOR) 10 MG tablet Take 1 tablet (10 mg total) by mouth daily. (Patient not taking: Reported on 04/27/2021) 90 tablet 1  ? baclofen (LIORESAL) 10 MG  tablet Take 0.5-1 tablets (5-10 mg total) by mouth at bedtime as needed for muscle spasms. (Patient not taking: Reported on 10/31/2020) 90 tablet 1  ? ergocalciferol (DRISDOL) 1.25 MG (50000 UT) capsule Take 1 capsule (50,000 Units total) by mouth once a week. (Patient not taking: Reported on 04/27/2021) 12 capsule 1  ? hydrocortisone-pramoxine (ANALPRAM HC) 2.5-1 % rectal cream Place 1 application rectally 3 (three) times daily. (Patient not taking: Reported on 04/27/2021) 30 g 1  ? meclizine (ANTIVERT) 25 MG tablet Take 1 tablet (25 mg total) by mouth 2 (two) times daily as needed for dizziness. (Patient not taking:  Reported on 10/31/2020) 60 tablet 1  ? polyethylene glycol powder (GLYCOLAX/MIRALAX) 17 GM/SCOOP powder Take 17 g by mouth daily. (Patient not taking: Reported on 04/27/2021) 3350 g 1  ? vitamin B-12 (CYANOCOBALAMIN) 1000 MCG tablet Take 1 tablet (1,000 mcg total) by mouth daily. (Patient not taking: Reported on 04/27/2021) 90 tablet 1  ? ? ?Past Medical History:  ?Past Medical History:  ?Diagnosis Date  ? GERD (gastroesophageal reflux disease)   ? Hypertension   ? Lung nodule   ? 7 mm LUL nodule  ? ? ?Social:  ?Patient lives in [place] with his wife and son. He is independent in ADLs and iADLs. He denies any history of tobacco use although records indicate smoking cessation in 2018, alcohol use or illicit substance use. ?PCP - Dr Charlott Rakes ? ?Family History: ?Family History  ?Problem Relation Age of Onset  ? Healthy Mother   ? Diabetes Brother   ? Colon polyps Neg Hx   ? Colon cancer Neg Hx   ? Esophageal cancer Neg Hx   ? Rectal cancer Neg Hx   ? Stomach cancer Neg Hx   ? ? ?Allergies: ?Allergies as of 04/27/2021  ? (No Known Allergies)  ? ? ?Review of Systems: ?A complete ROS was negative except as per HPI.  ? ?OBJECTIVE:  ?Physical Exam: ?Blood pressure 131/90, pulse 84, temperature (!) 97.5 ?F (36.4 ?C), temperature source Oral, resp. rate 20, height '5\' 10"'$  (1.778 m), weight 85.7 kg, SpO2 96 %. ?Physical Exam  ?Constitutional: Appears well-developed and well-nourished. No distress.  ?HENT: Normocephalic and atraumatic, EOMI, moist mucous membranes ?Cardiovascular: Normal rate, regular rhythm, S1 and S2 present, no murmurs, rubs, gallops.  Distal pulses intact ?Respiratory: No respiratory distress, Lungs are clear to auscultation bilaterally. No wheezing, rales or rhonchi ?GI: Nondistended, soft, nontender to palpation, normal bowel sounds ?Musculoskeletal: Normal bulk and tone.  No peripheral edema noted. ?Neurological:  ?Mental Status: ?Patient is awake, alert, oriented x4. No signs of aphasia or  neglect ?Cranial Nerves: ?II: Pupils equal, round, and reactive to light.   ?III,IV, VI: EOMI without ptosis or diploplia.  ?V: Facial sensation is symmetric to light touch  ?VII: Facial movement is symmetric.  ?VIII: hearing is intact to voice ?X: Uvula elevates symmetrically ?XI: Shoulder shrug is symmetric. ?XII: tongue is midline without atrophy or fasciculations.  ?Motor: 5/5 in LUE and LLE; 4/5 in RUE; 3/5 in RLE ?Sensory: Sensation is grossly intact to light touch bilateral UEs & LEs ?Cerebellar: Finger-to-nose unable to be completed on R side due to weakness; Heel to shin intact bilaterally ?Skin: Warm and dry.  No rash, erythema, lesions noted. ?Psychiatric: Normal mood and affect. ? ?Pertinent Labs: ?CBC ?   ?Component Value Date/Time  ? WBC 8.2 04/27/2021 1012  ? RBC 6.56 (H) 04/27/2021 1012  ? HGB 18.7 (H) 04/27/2021 1124  ? HGB 17.8 (H) 06/26/2020  0944  ? HGB 16.5 03/13/2009 0931  ? HCT 55.0 (H) 04/27/2021 1124  ? HCT 54.5 (H) 06/26/2020 0944  ? HCT 49.8 03/13/2009 0931  ? PLT 270 04/27/2021 1012  ? PLT 181 06/26/2020 0944  ? MCV 82.9 04/27/2021 1012  ? MCV 83 06/26/2020 0944  ? MCV 82.2 03/13/2009 0931  ? MCH 27.6 04/27/2021 1012  ? MCHC 33.3 04/27/2021 1012  ? RDW 13.8 04/27/2021 1012  ? RDW 16.3 (H) 06/26/2020 0944  ? RDW 15.5 (H) 03/13/2009 0931  ? LYMPHSABS 2.0 04/27/2021 1012  ? LYMPHSABS 2.2 06/26/2020 0944  ? LYMPHSABS 2.2 03/13/2009 0931  ? MONOABS 0.6 04/27/2021 1012  ? MONOABS 0.5 03/13/2009 0931  ? EOSABS 0.0 04/27/2021 1012  ? EOSABS 0.0 06/26/2020 0944  ? BASOSABS 0.0 04/27/2021 1012  ? BASOSABS 0.0 06/26/2020 0944  ? BASOSABS 0.0 03/13/2009 0931  ?  ?CMP  ?   ?Component Value Date/Time  ? NA 142 04/27/2021 1124  ? NA 139 06/26/2020 0944  ? K 3.3 (L) 04/27/2021 1124  ? CL 105 04/27/2021 1124  ? CO2 25 04/27/2021 1012  ? GLUCOSE 86 04/27/2021 1124  ? BUN 11 04/27/2021 1124  ? BUN 19 06/26/2020 0944  ? CREATININE 0.80 04/27/2021 1124  ? CREATININE 0.92 04/28/2017 1231  ? CREATININE 1.02  12/05/2015 1109  ? CALCIUM 9.2 04/27/2021 1012  ? PROT 7.4 04/27/2021 1012  ? PROT 6.9 06/26/2020 0944  ? ALBUMIN 3.9 04/27/2021 1012  ? ALBUMIN 4.3 06/26/2020 0944  ? AST 20 04/27/2021 1012  ? AST 20 04/28/2017 1231  ? AL

## 2021-04-27 NOTE — Progress Notes (Signed)
VASCULAR LAB ? ? ? ?Right lower extremity venous duplex has been performed. ? ?See CV proc for preliminary results. ? ?Messaged results to Dr. Sherry Ruffing via secure chat ? ?Gee Habig, RVT ?04/27/2021, 12:50 PM ? ?

## 2021-04-27 NOTE — ED Triage Notes (Signed)
Patient here with complaint of right sided weakness that started suddenly on Thursday night. Patient has no dysarthria, slight right sided facial droop, decreased sensation in right leg and arm but normal sensation in face, right arm drifts but does not fall to wheelchair before ten seconds, patient is alert and oriented. ?

## 2021-04-28 ENCOUNTER — Other Ambulatory Visit: Payer: Self-pay | Admitting: Radiology

## 2021-04-28 ENCOUNTER — Inpatient Hospital Stay (HOSPITAL_COMMUNITY): Payer: Medicare Other

## 2021-04-28 DIAGNOSIS — Z8673 Personal history of transient ischemic attack (TIA), and cerebral infarction without residual deficits: Secondary | ICD-10-CM | POA: Diagnosis not present

## 2021-04-28 DIAGNOSIS — I69392 Facial weakness following cerebral infarction: Secondary | ICD-10-CM | POA: Diagnosis not present

## 2021-04-28 DIAGNOSIS — R748 Abnormal levels of other serum enzymes: Secondary | ICD-10-CM | POA: Diagnosis present

## 2021-04-28 DIAGNOSIS — N400 Enlarged prostate without lower urinary tract symptoms: Secondary | ICD-10-CM | POA: Diagnosis present

## 2021-04-28 DIAGNOSIS — I671 Cerebral aneurysm, nonruptured: Secondary | ICD-10-CM

## 2021-04-28 DIAGNOSIS — Z87891 Personal history of nicotine dependence: Secondary | ICD-10-CM | POA: Diagnosis not present

## 2021-04-28 DIAGNOSIS — I6381 Other cerebral infarction due to occlusion or stenosis of small artery: Secondary | ICD-10-CM | POA: Diagnosis present

## 2021-04-28 DIAGNOSIS — G936 Cerebral edema: Secondary | ICD-10-CM | POA: Diagnosis present

## 2021-04-28 DIAGNOSIS — G8191 Hemiplegia, unspecified affecting right dominant side: Secondary | ICD-10-CM | POA: Diagnosis present

## 2021-04-28 DIAGNOSIS — Z79899 Other long term (current) drug therapy: Secondary | ICD-10-CM | POA: Diagnosis not present

## 2021-04-28 DIAGNOSIS — R29704 NIHSS score 4: Secondary | ICD-10-CM | POA: Diagnosis present

## 2021-04-28 DIAGNOSIS — E876 Hypokalemia: Secondary | ICD-10-CM | POA: Diagnosis not present

## 2021-04-28 DIAGNOSIS — I2510 Atherosclerotic heart disease of native coronary artery without angina pectoris: Secondary | ICD-10-CM | POA: Diagnosis not present

## 2021-04-28 DIAGNOSIS — K5900 Constipation, unspecified: Secondary | ICD-10-CM | POA: Diagnosis not present

## 2021-04-28 DIAGNOSIS — R531 Weakness: Secondary | ICD-10-CM | POA: Diagnosis not present

## 2021-04-28 DIAGNOSIS — I10 Essential (primary) hypertension: Secondary | ICD-10-CM | POA: Diagnosis not present

## 2021-04-28 DIAGNOSIS — D751 Secondary polycythemia: Secondary | ICD-10-CM

## 2021-04-28 DIAGNOSIS — I639 Cerebral infarction, unspecified: Secondary | ICD-10-CM | POA: Diagnosis not present

## 2021-04-28 DIAGNOSIS — K219 Gastro-esophageal reflux disease without esophagitis: Secondary | ICD-10-CM | POA: Diagnosis not present

## 2021-04-28 DIAGNOSIS — I69398 Other sequelae of cerebral infarction: Secondary | ICD-10-CM | POA: Diagnosis not present

## 2021-04-28 DIAGNOSIS — F32A Depression, unspecified: Secondary | ICD-10-CM | POA: Diagnosis not present

## 2021-04-28 DIAGNOSIS — Z20822 Contact with and (suspected) exposure to covid-19: Secondary | ICD-10-CM | POA: Diagnosis not present

## 2021-04-28 DIAGNOSIS — K5901 Slow transit constipation: Secondary | ICD-10-CM | POA: Diagnosis not present

## 2021-04-28 DIAGNOSIS — I63412 Cerebral infarction due to embolism of left middle cerebral artery: Secondary | ICD-10-CM | POA: Diagnosis not present

## 2021-04-28 DIAGNOSIS — E785 Hyperlipidemia, unspecified: Secondary | ICD-10-CM | POA: Diagnosis not present

## 2021-04-28 DIAGNOSIS — F329 Major depressive disorder, single episode, unspecified: Secondary | ICD-10-CM | POA: Diagnosis not present

## 2021-04-28 DIAGNOSIS — G4700 Insomnia, unspecified: Secondary | ICD-10-CM | POA: Diagnosis not present

## 2021-04-28 DIAGNOSIS — R29810 Facial weakness: Secondary | ICD-10-CM | POA: Diagnosis present

## 2021-04-28 DIAGNOSIS — Z833 Family history of diabetes mellitus: Secondary | ICD-10-CM | POA: Diagnosis not present

## 2021-04-28 DIAGNOSIS — N401 Enlarged prostate with lower urinary tract symptoms: Secondary | ICD-10-CM | POA: Diagnosis present

## 2021-04-28 DIAGNOSIS — E559 Vitamin D deficiency, unspecified: Secondary | ICD-10-CM | POA: Diagnosis not present

## 2021-04-28 DIAGNOSIS — R338 Other retention of urine: Secondary | ICD-10-CM | POA: Diagnosis not present

## 2021-04-28 LAB — CBC
HCT: 52.5 % — ABNORMAL HIGH (ref 39.0–52.0)
Hemoglobin: 17.3 g/dL — ABNORMAL HIGH (ref 13.0–17.0)
MCH: 27.3 pg (ref 26.0–34.0)
MCHC: 33 g/dL (ref 30.0–36.0)
MCV: 82.9 fL (ref 80.0–100.0)
Platelets: 226 10*3/uL (ref 150–400)
RBC: 6.33 MIL/uL — ABNORMAL HIGH (ref 4.22–5.81)
RDW: 13.6 % (ref 11.5–15.5)
WBC: 6.9 10*3/uL (ref 4.0–10.5)
nRBC: 0 % (ref 0.0–0.2)

## 2021-04-28 LAB — RAPID URINE DRUG SCREEN, HOSP PERFORMED
Amphetamines: NOT DETECTED
Barbiturates: NOT DETECTED
Benzodiazepines: NOT DETECTED
Cocaine: NOT DETECTED
Opiates: NOT DETECTED
Tetrahydrocannabinol: NOT DETECTED

## 2021-04-28 LAB — COMPREHENSIVE METABOLIC PANEL
ALT: 11 U/L (ref 0–44)
AST: 18 U/L (ref 15–41)
Albumin: 3.6 g/dL (ref 3.5–5.0)
Alkaline Phosphatase: 112 U/L (ref 38–126)
Anion gap: 10 (ref 5–15)
BUN: 11 mg/dL (ref 8–23)
CO2: 23 mmol/L (ref 22–32)
Calcium: 8.8 mg/dL — ABNORMAL LOW (ref 8.9–10.3)
Chloride: 105 mmol/L (ref 98–111)
Creatinine, Ser: 0.93 mg/dL (ref 0.61–1.24)
GFR, Estimated: >60 mL/min (ref >60)
Glucose, Bld: 82 mg/dL (ref 70–99)
Potassium: 3.2 mmol/L — ABNORMAL LOW (ref 3.5–5.1)
Sodium: 138 mmol/L (ref 135–145)
Total Bilirubin: 2.6 mg/dL — ABNORMAL HIGH (ref 0.3–1.2)
Total Protein: 6.8 g/dL (ref 6.5–8.1)

## 2021-04-28 LAB — HIV ANTIBODY (ROUTINE TESTING W REFLEX): HIV Screen 4th Generation wRfx: NONREACTIVE

## 2021-04-28 LAB — PATHOLOGIST SMEAR REVIEW

## 2021-04-28 MED ORDER — POTASSIUM CHLORIDE CRYS ER 20 MEQ PO TBCR
40.0000 meq | EXTENDED_RELEASE_TABLET | Freq: Once | ORAL | Status: AC
  Administered 2021-04-28: 40 meq via ORAL
  Filled 2021-04-28: qty 2

## 2021-04-28 MED ORDER — ATORVASTATIN CALCIUM 40 MG PO TABS
40.0000 mg | ORAL_TABLET | Freq: Every day | ORAL | Status: DC
Start: 2021-04-28 — End: 2021-04-29
  Administered 2021-04-28: 40 mg via ORAL
  Filled 2021-04-28: qty 1

## 2021-04-28 MED ORDER — ALTEPLASE 2 MG IJ SOLR: 2.0000 mg | Freq: Once | INTRAMUSCULAR | Status: DC

## 2021-04-28 MED ORDER — ROSUVASTATIN CALCIUM 20 MG PO TABS: 20.0000 mg | ORAL_TABLET | Freq: Every day | ORAL | Status: DC

## 2021-04-28 MED ORDER — SODIUM CHLORIDE 0.9 % IV SOLN: INTRAVENOUS | Status: AC

## 2021-04-28 MED ORDER — SODIUM CHLORIDE 0.9 % IV SOLN: INTRAVENOUS | Status: DC

## 2021-04-28 NOTE — Plan of Care (Signed)
?  Problem: Education: ?Goal: Knowledge of General Education information will improve ?Description: Including pain rating scale, medication(s)/side effects and non-pharmacologic comfort measures ?Outcome: Progressing ?  ?Problem: Health Behavior/Discharge Planning: ?Goal: Ability to manage health-related needs will improve ?Outcome: Progressing ?  ?Problem: Clinical Measurements: ?Goal: Ability to maintain clinical measurements within normal limits will improve ?Outcome: Progressing ?Goal: Will remain free from infection ?Outcome: Progressing ?Goal: Diagnostic test results will improve ?Outcome: Progressing ?Goal: Respiratory complications will improve ?Outcome: Progressing ?Goal: Cardiovascular complication will be avoided ?Outcome: Progressing ?  ?Problem: Activity: ?Goal: Risk for activity intolerance will decrease ?Outcome: Progressing ?  ?Problem: Nutrition: ?Goal: Adequate nutrition will be maintained ?Outcome: Progressing ?  ?Problem: Health Behavior/Discharge Planning: ?Goal: Ability to manage health-related needs will improve ?Outcome: Progressing ?  ?Problem: Clinical Measurements: ?Goal: Ability to maintain clinical measurements within normal limits will improve ?Outcome: Progressing ?  ?Problem: Clinical Measurements: ?Goal: Will remain free from infection ?Outcome: Progressing ?  ?Problem: Clinical Measurements: ?Goal: Diagnostic test results will improve ?Outcome: Progressing ?  ?

## 2021-04-28 NOTE — Evaluation (Signed)
Speech Language Pathology Evaluation ?Patient Details ?Name: Aaron Dennis ?MRN: [id number] ?DOB: 18-Jun-1955 ?Today's Date: 04/28/2021 ?Time: 9702-6378 ?SLP Time Calculation (min) (ACUTE ONLY): 30 min ? ?Problem List:  ?Patient Active Problem List  ? Diagnosis Date Noted  ? Acute CVA (cerebrovascular accident) ([place]) 04/27/2021  ? Primary osteoarthritis of left knee 01/13/2019  ? Primary osteoarthritis of right knee 01/13/2019  ? Flat foot 11/28/2017  ? Tendonitis of foot 11/28/2017  ? Polycythemia 05/12/2017  ? Vitamin D deficiency 04/21/2017  ? Bony sclerosis 08/05/2016  ? Lung nodule   ? Chronic pain of both knees 12/05/2015  ? Gastroesophageal reflux disease without esophagitis 05/17/2014  ? Essential hypertension 05/17/2014  ? Coronary artery calcification 08/17/2012  ? Abnormal LFTs (liver function tests) 07/11/2012  ? Chronic abdominal pain 06/24/2012  ? Right-sided chest wall pain 06/24/2012  ? Constipation, chronic 06/24/2012  ? Hyperbilirubinemia 06/24/2012  ? Chronic back pain 06/24/2012  ? DDD (degenerative disc disease), thoracolumbar 06/24/2012  ? History of nephrolithiasis 06/24/2012  ? Side pain 06/24/2012  ? Low back pain at multiple sites 06/24/2012  ? ?Past Medical History:  ?Past Medical History:  ?Diagnosis Date  ? GERD (gastroesophageal reflux disease)   ? Hypertension   ? Lung nodule   ? 7 mm LUL nodule  ? ?Past Surgical History: History reviewed. No pertinent surgical history. ?HPI:  ?Aaron Dennis is a 66 y.o. who presented with right sided weakness and admit for acute left frontoparietal CVA. Pt with pertinent PMH of hypertension, hyperlipidemia, bilateral knee osteoarthritis, and polycythemia.  ? ?Assessment / Plan / Recommendation ?Clinical Impression ? Pt presents with functional cognitive linguistic ability.  Pt was assessed informally and with portions of the COGNISTAT (see below for additional information).  Pt's native language is Arabic, but pt was comfortable completed assessment in  English with son present to translate as needed.  Pt/family declined video interpreter.  Pt exhibited excellent facility with higher level cognitive reasoning tasks despite language barrier.  Errors in naming and with word recall task are likely 2/2 language barrier.  Pt's son was very helpful in linguistic and cultural translation allowing SLP to modify questions appropriately.  With confrontational naming task, stimuli from Cognistat were not appropriate.  Pt was able to name objects in room without difficulty.  Son did note that pt had difficulty coming up with the word "stroke" earlier despite high frequency use in current setting.  Given the nature of memory assessment as a context free word recall task in a non-native language, memory was not directly assessed. Pt able to recall reason for hospital admission and family reports no new memory deficits.   Family and pt report that he is essentially at cognitive-linguistic baseline. ? ?Pt does not have any ST needs at this time; SLP will sign off at this time. ? ?COGNISTAT: All subtests are within the average range, except where otherwise specified. ? ?Orientation:  12/12 ?Attention: 7/8 ?Comprehension: 5/6 ?Repetition: 12/12 ?Naming: assessed informally ?Construction: not assessed ?Memory: not assessed ?Calculations: 4/4 ?Similarities: 6/8 ?Judgment: 6/6 ? ?   ?SLP Assessment ? SLP Recommendation/Assessment: Patient does not need any further [place] [facility] ?SLP Visit Diagnosis: Cognitive communication deficit (R41.841)  ?  ?Recommendations for follow up therapy are one component of a multi-disciplinary discharge planning process, led by the attending physician.  Recommendations may be updated based on patient status, additional functional criteria and insurance authorization. ?   ?Follow Up Recommendations ? No SLP follow up  ?  ?Assistance Recommended  at Discharge ? None  ?Functional Status Assessment Patient has not had a recent decline in  their functional status  ?Frequency and Duration   N/A ?  ?  ?   ?SLP Evaluation ?Cognition ? Overall Cognitive Status: Within Functional Limits for tasks assessed ?Orientation Level: Oriented X4 ?Attention: Focused;Sustained ?Focused Attention: Appears intact ?Sustained Attention: Appears intact ?Memory:  (Unable to assess) ?Problem Solving: Appears intact ?Executive Function: Reasoning ?Reasoning: Appears intact  ?  ?   ?Comprehension ? Auditory Comprehension ?Overall Auditory Comprehension: Appears within functional limits for tasks assessed ?Commands: Within Functional Limits ?Conversation: Simple ?Visual Recognition/Discrimination ?Discrimination: Not tested ?Reading Comprehension ?Reading Status: Not tested  ?  ?Expression Expression ?Primary Mode of Expression: Verbal ?Verbal Expression ?Overall Verbal Expression: Appears within functional limits for tasks assessed ?Naming:  (questionable word finding difficulty) ?Written Expression ?Dominant Hand: Right ?Written Expression: Not tested   ?Oral / Motor ? Motor Speech ?Overall Motor Speech: Appears within functional limits for tasks assessed ?Respiration: Within functional limits ?Phonation: Normal ?Resonance: Within functional limits ?Articulation: Within functional limitis ?Intelligibility: Intelligible ?Motor Planning: Witnin functional limits ?Motor Speech Errors: Not applicable   ?        ? ?Dreama Kuna E Hubert Raatz , MA, CCC-SLP ?Acute Rehabilitation Services ?Office: [phone] ?04/28/2021, 12:51 PM ? ?

## 2021-04-28 NOTE — Progress Notes (Signed)
? ?HD#0 ?SUBJECTIVE:  ?Patient Summary: Aaron Dennis is a 66 y.o. with pertinent PMH of hypertension, hyperlipidemia, bilateral knee osteoarthritis, and polycythemia who presented with right sided weakness and admit for acute left frontoparietal CVA. ? ?Overnight Events: None ? ?Interim History: Patient is concerned about his R side feeling "dead," that he had trouble working with therapy as a result. He denies any confusion or vision changes, no headache, no changes in sensation. He states that this R sided weakness is new and that previously he could move his arm and leg. In speaking with his nurse it seems that there is some discrepancy between what was documented overnight (some movement of the RUE) versus what she was told (no movement). Family is in the room and states that he had new falls recently and that he has had periods of his legs giving out since Wednesday. He has some pain in the R ankle but explains that he has gotten steroid injections before for the pain. Neurology team made aware of possible new changes and will see him soon. ? ?OBJECTIVE:  ?Vital Signs: ?Vitals:  ? 04/27/21 2000 04/27/21 2256 04/28/21 0105 04/28/21 0400  ?BP: 118/85 121/80 (!) 115/91   ?Pulse: 79 73 75   ?Resp: '14 19 19 20  '$ ?Temp:  98.5 ?F (36.9 ?C) 98.2 ?F (36.8 ?C)   ?TempSrc:  Oral Oral   ?SpO2: 99% 100% 98%   ?Weight:  83.9 kg    ?Height:  '5\' 10"'$  (1.778 m)    ? ?Supplemental O2: Room Air ?SpO2: 98 % ? ?Filed Weights  ? 04/27/21 1101 04/27/21 2256  ?Weight: 85.7 kg 83.9 kg  ? ? ? ?Intake/Output Summary (Last 24 hours) at 04/28/2021 0724 ?Last data filed at 04/27/2021 1309 ?Gross per 24 hour  ?Intake --  ?Output 200 ml  ?Net -200 ml  ? ?Net IO Since Admission: -200 mL [04/28/21 0724] ? ?Physical Exam: ?Constitutional:No acute distress. Seated in bedside recliner.  ?Cardio:Regular rate and rhythm. No murmurs, rubs, gallops. ?Pulm:Normal work of breathing on room air. ?ERX:VQMGQQPY for extremity edema. R ankle without increased  edema or pain with passive movement. ?Skin:Warm and dry. ?Neuro: ?Mental Status: ?Patient is awake, alert, oriented x4 ?No signs of aphasia or neglect ?Cranial Nerves: ?III,IV, VI: EOMI without ptosis or diploplia.  ?V: Facial sensation is symmetric to light touch and temperature. ?VII: Facial movement is symmetric.  ?VIII: Hearing is intact to voice ?X: Uvula elevates symmetrically ?XI: Shoulder shrug 3/5 on R, 5/5 on L. ?XII: Tongue is midline without atrophy or fasciculations.  ?Motor: R: 0/5 UE with limb immediately falling to lap, 3/5 LE; L 5/5 UE and LE ?Sensory: Sensation is grossly intact and equal bilateral UE & LE ?Cerebellar: Finger-Nose intact on L. ?Psych:Flat affect. ? ?Patient Lines/Drains/Airways Status   ? ? Active Line/Drains/Airways   ? ? Name Placement date Placement time Site Days  ? Peripheral IV 04/27/21 20 G Posterior;Right Forearm 04/27/21  1112  Forearm  1  ? ?  ?  ? ?  ? ? ? ?ASSESSMENT/PLAN:  ?Assessment: ?Principal Problem: ?  Acute CVA (cerebrovascular accident) ([place]) ? ?Aaron Dennis is a 66 y.o. with pertinent PMH of hypertension, hyperlipidemia, bilateral knee osteoarthritis, and polycythemia who presented with right sided weakness and admit for acute left frontoparietal CVA. ? ?Plan: ?#Acute L frontoparietal CVA ?Patient reports new-since-admission onset of R-sided weakness and hemiplegia. Further investigation reveals that at time of admission he had some strength still in the RUE where he now  is unable to hold the limb against gravity. RLE strength is still 3/5. He has no other focal changes noted on exam. Hemodynamically stable at this time. Risk factor work-up thus far has revealed echocardiogram with no major abnormalities including LV EF 60-65%, negative bubble study. Lipid panel was remarkable for elevated LDL at 120, HbA1c normal at 5.3%. ?-Neurology consulted, appreciate recommendations ?-Continue aspirin 81 mg daily ?-Continue plavix 75 mg daily  ?-Cardiac  monitoring ?-PT/OT/SLP eval ?  ?#Polycythemia, chronic  ?#Elevated alkaline phosphatase ?EPO, SPEP, UPEP, blood smear review pending at this time. AM labs revealed Hgb of 17.3 this morning. ?-Trend CBC and CMP ?-F/u EPO, SPEP, UPEP, pathologist smear results ?-F/u renal ultrasound ?  ?#Hypertension ?Normotensive at this time. Managed at home with amlodipine 5 mg daily. ?-Continue home amlodipnie 5 mg daily. ?  ?#Hyperlipidemia ?Lipid panel remarkable for LDL 120.  ?-Increase atorvastatin to 40 mg daily ? ?Best Practice: ?Diet: Cardiac diet ?IVF: None ?VTE: enoxaparin (LOVENOX) injection 40 mg Start: 04/27/21 1800 ?Code: Full ?AB: None ?Therapy Recs: Pending ?DISPO: Anticipated discharge in 1-3 days pending Medical stability. ? ?Signature: ?Farrel Gordon, D.O.  ?Internal Medicine Resident, PGY-1 ?[facility]  ?Pager: [phone] ?7:24 AM, 04/28/2021  ? ?Please contact the on call pager after 5 pm and on weekends at [phone]. ? ?

## 2021-04-28 NOTE — Progress Notes (Addendum)
STROKE TEAM PROGRESS NOTE  ? ?ATTENDING NOTE: ?I reviewed above note and agree with the assessment and plan. Pt was seen and examined ? ?66 year old male with history of hypertension, CAD, polycythemia admitted for fluctuating right-sided weakness for 5 days.  Currently right-sided flaccid.  CT no acute abnormality.  CTA head and neck showed multifocal intracranial stenosis including left V4, mid the [facility], left P2.  Also has 4x3 right V4 aneurysm.  LE venous Doppler negative for DVT.  EF 60 to 65%.  MRI showed left CR infarct.  LDL 120, A1c 4.9.  UDS negative.  Creatinine 0.80.   ? ?On exam, patient awake alert, orientated x3, no aphasia, mild dysarthria, follows some commands.  Able to name and repeat.  No gaze palsy, visual field full, right mild facial droop.  Tongue midline.  Left upper and lower extremity 5/5, right upper and lower extremity 0-1/5.  Sensation symmetrical.  Left finger-to-nose intact. ? ?Patient has fluctuated right-sided weakness for last 5 days, clinical course consistent with capsular warning syndrome.  No specific treatment available.  Currently right-sided hemiplegia.  Continue aspirin 81 and Plavix 75 DAPT for 3 weeks and then aspirin alone.  Increase Lipitor 10 to Crestor 20.  Aggressive risk factor modification.  PT/OT remain CIR. ? ?Regarding his polycythemia, currently hemoglobin 18.1->18.7.  In the past, JAK2 mutation negative.  Erythropoietin negative.  He follows with Dr. Julien Nordmann at [facility] long oncology, status post phlebotomies.  Recommend to contact Dr. Julien Nordmann for possible further phlebotomies. ? ?Regarding his 4x3 cerebral aneurysm found on CTA, discussed with Dr. Estanislado Pandy interventional radiology, will follow-up as outpatient. ? ?For detailed assessment and plan, please refer to above as I have made changes wherever appropriate.  ? ?Neurology will sign off. Please call with questions. Pt will follow up with stroke clinic NP at [facility] in about 4 weeks. Thanks for the  consult. ? ? ?Rosalin Hawking, MD PhD ?Stroke Neurology ?04/28/2021 ?10:28 PM ? ? ? ?INTERVAL HISTORY ?His family members are at the bedside, assisting with translation.  He was comfortable, awake and alert. ?Message from primary team reporting the patient has discrepancies in right-sided weakness. ?Wednesday (3/15) sudden onset and new right-sided weakness, without numbness, that lasted about  ?Thursday felt normal, went to plane at 5p from [place] to [place] to [place]. ?On plane to [place], "right leg felt dead", was suggested to go to hospital, but declined because sxs resolved. BP in planes and airport 140s ?Friday, got to house in [place], felt normal. ?Staturday felt weak but able to walk. ?Sunday afternoon felt better, but then felt worst again. ? ?H/o of polycythemia with phlebotomy, HTN.  Outpatient heme-onc, Dr. Inda Merlin, last seen 2019. Recommended to follow-up with Dr. Inda Merlin, after discharge. ? ?Currently no speech issues confirmed by family. Speech fluent, accented, spontaneous, no hesitency. ?Mild right sided facial drooping, can move symmetrically. ?RUE 0/5 strength ?RLE 3/5 with drifting vertically ?Left-side 5/5 strength ? ?Capsular Warning Syndrome - different presentation of strokes, waxing and waning sxs 2/2 vessel vasospasm ?On daily ASA and home BP meds at home. ? ?Vitals:  ? 04/28/21 0105 04/28/21 0400 04/28/21 0753 04/28/21 1200  ?BP: (!) 115/91  124/81 116/79  ?Pulse: 75     ?Resp: '19 20 19   '$ ?Temp: 98.2 ?F (36.8 ?C)  97.9 ?F (36.6 ?C) 97.9 ?F (36.6 ?C)  ?TempSrc: Oral  Oral Oral  ?SpO2: 98%     ?Weight:      ?Height:      ? ?CBC:  ?Recent  Labs  ?Lab 04/27/21 ?1012 04/27/21 ?1124 04/28/21 ?2353  ?WBC 8.2  --  6.9  ?NEUTROABS 5.5  --   --   ?HGB 18.1* 18.7* 17.3*  ?HCT 54.4* 55.0* 52.5*  ?MCV 82.9  --  82.9  ?PLT 270  --  226  ? ?Basic Metabolic Panel:  ?Recent Labs  ?Lab 04/27/21 ?1012 04/27/21 ?1124 04/28/21 ?6144  ?NA 139 142 138  ?K 3.4* 3.3* 3.2*  ?CL 105 105 105  ?CO2 25  --  23  ?GLUCOSE 94 86 82   ?BUN '9 11 11  '$ ?CREATININE 0.91 0.80 0.93  ?CALCIUM 9.2  --  8.8*  ? ?Lipid Panel:  ?Recent Labs  ?Lab 04/27/21 ?1649  ?CHOL 159  ?TRIG 52  ?HDL 29*  ?CHOLHDL 5.5  ?VLDL 10  ?LDLCALC 120*  ? ?HgbA1c:  ?Recent Labs  ?Lab 04/27/21 ?1649  ?HGBA1C 4.9  ? ?Urine Drug Screen:  ?Recent Labs  ?Lab 04/28/21 ?0140  ?LABOPIA NONE DETECTED  ?COCAINSCRNUR NONE DETECTED  ?LABBENZ NONE DETECTED  ?AMPHETMU NONE DETECTED  ?THCU NONE DETECTED  ?LABBARB NONE DETECTED  ? ?Alcohol Level No results for input(s): ETH in the last 168 hours. ? ?IMAGING past 24 hours ?CT ANGIO HEAD NECK W WO CM ? ?Result Date: 04/27/2021 ?CLINICAL DATA:  Stroke/TIA, determine embolic source EXAM: CT ANGIOGRAPHY HEAD AND NECK TECHNIQUE: Multidetector CT imaging of the head and neck was performed using the standard protocol during bolus administration of intravenous contrast. Multiplanar CT image reconstructions and MIPs were obtained to evaluate the vascular anatomy. Carotid stenosis measurements (when applicable) are obtained utilizing NASCET criteria, using the distal internal carotid diameter as the denominator. RADIATION DOSE REDUCTION: This exam was performed according to the departmental dose-optimization program which includes automated exposure control, adjustment of the mA and/or kV according to patient size and/or use of iterative reconstruction technique. CONTRAST:  29m OMNIPAQUE IOHEXOL 350 MG/ML SOLN COMPARISON:  Same day MRI and CT. FINDINGS: CTA NECK FINDINGS Aortic arch: Great vessel origins are patent without significant stenosis. Right carotid system: No evidence of dissection, stenosis (50% or greater) or occlusion. Left carotid system: No evidence of dissection, stenosis (50% or greater) or occlusion. Vertebral arteries: Right dominant. No evidence of dissection, stenosis (50% or greater) or occlusion. Skeleton: Chronic mixed lytic and sclerotic changes throughout the skeleton, which could represent myelofibrosis or metabolic bone  disorder. Other neck: No acute findings. Upper chest: Visualized lung apices are clear. Review of the MIP images confirms the above findings CTA HEAD FINDINGS Anterior circulation: Hypoplastic or absent left A1 ACA, probably congenital given prominent right A1 ACA supplying bilateral A2 ACAs. Otherwise, bilateral intracranial ICAs, MCAs, and ACAs are patent without proximal hemodynamically significant stenosis. 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 Moderate stenosis of the distal left intradural vertebral artery. Moderate stenosis of the mid basilar artery. Bilateral posterior cerebral arteries are patent with mild left P1 PCA stenosis and moderate left proximal P2 PCA stenosis. Venous sinuses: Not well evaluated due to arterial timing. Anatomic variants: Detailed above. Review of the MIP images confirms the above findings IMPRESSION: 1. No large vessel occlusion. 2. Moderate stenosis of the distal left intradural vertebral artery, mid basilar artery and proximal left P2 PCA. 3. 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 4. Chronic mixed lytic and  sclerotic changes throughout the skeleton, which could represent myelofibrosis or metabolic bone disorder. Findings further evaluated on prior PET-CT. Electronically Signed   By: Margaretha Sheffield M.D.   On: 04/27/2021 17:34  ? ?ECHOCARDIOGRAM COMPLETE BUBBLE STUDY ? ?Result Date: 04/27/2021 ?   ECHOCARDIOGRAM REPORT   Patient Name:   Aaron Dennis Date of Exam: 04/27/2021 Medical Rec #:  [id number]     Height:       70.0 in Accession #:    [id number]    Weight:       189.0 lb Date of Birth:  08-14-1955      BSA:          2.038 m? Patient Age:    1 years      BP:           125/82 mmHg Patient Gender: M             HR:           81  bpm. Exam Location:  Inpatient Procedure: 2D Echo, Cardiac Doppler, Color Doppler and Saline Contrast Bubble            Study Indications:    Stroke  History:        Patient has no prior history of Echocardiogram examinations.

## 2021-04-28 NOTE — Evaluation (Signed)
Physical Therapy Evaluation ?Patient Details ?Name: Aaron Dennis ?MRN: [id number] ?DOB: 1955/02/16 ?Today's Date: 04/28/2021 ? ?History of Present Illness ? Aaron Dennis is a 66 y.o. male presents to [facility] with right sided weakness. QIW:LNLGX infarct in the left frontoparietal [name]. Mild associated edema without mass effect. PMHx: hypertension, reflux disease, polycythemia and bilateral knee osteoarthritis.  ?Clinical Impression ? Pt admitted with above diagnosis. Pt needed +2 mod assist for transfers and initial ambulation attempt.  Pt limited by significant right hemibody weakness.  Pt is an excellent Rehab candidate. Son does work and pt will go home with wife but he can be a wheelchair level if needed. May need a ramp.  Will follow acutely.  Pt currently with functional limitations due to the deficits listed below (see PT Problem List). Pt will benefit from skilled PT to increase their independence and safety with mobility to allow discharge to the venue listed below.      ?   ? ?Recommendations for follow up therapy are one component of a multi-disciplinary discharge planning process, led by the attending physician.  Recommendations may be updated based on patient status, additional functional criteria and insurance authorization. ? ?Follow Up Recommendations Acute inpatient rehab (3hours/day) ? ?  ?Assistance Recommended at Discharge Set up Supervision/Assistance  ?Patient can return home with the following ? Two people to help with walking and/or transfers;A lot of help with bathing/dressing/bathroom;Help with stairs or ramp for entrance ? ?  ?Equipment Recommendations Wheelchair (measurements PT);Wheelchair cushion (measurements PT);BSC/3in1 (possible hemiwalker)  ?Recommendations for Other Services ? Rehab consult  ?  ?Functional Status Assessment Patient has had a recent decline in their functional status and demonstrates the ability to make significant improvements in function in a reasonable and  predictable amount of time.  ? ?  ?Precautions / Restrictions Precautions ?Precautions: Fall ?Restrictions ?Weight Bearing Restrictions: No  ? ?  ? ?Mobility ? Bed Mobility ?Overal bed mobility: Needs Assistance ?Bed Mobility: Rolling, Sidelying to Sit, Sit to Sidelying ?Rolling: Min assist (to right) ?Sidelying to sit: Mod assist ?  ?  ?Sit to sidelying: Mod assist ?General bed mobility comments: VCs for sequencing as well as assist ?  ? ?Transfers ?Overall transfer level: Needs assistance ?Equipment used: 1 person hand held assist ?Transfers: Sit to/from Stand ?Sit to Stand: Mod assist, +2 safety/equipment, From elevated surface ?  ?  ?  ?  ?  ?General transfer comment: Pt stood from bed with +2 mod assist with assist blocking right knee, HHA on left for support and use of gait belt. Worked on weight shifting and stepping in standing. Also cued for posture as pt tends to lean forward. ?  ? ?Ambulation/Gait ?Ambulation/Gait assistance: Mod assist, +2 physical assistance ?Gait Distance (Feet): 3 Feet ?Assistive device: 1 person hand held assist (Hand on back of chair positioned to pts left) ?Gait Pattern/deviations: Step-to pattern, Decreased step length - right, Decreased stance time - right, Decreased dorsiflexion - right, Decreased weight shift to right, Knee flexed in stance - right, Knees buckling, Trunk flexed ?  ?Gait velocity interpretation: <1.31 ft/sec, indicative of household ambulator ?  ?General Gait Details: Pt was able to ambulate a few steps forward and back with HHA on left on chair back for support, cues for sequencing steps, at times needed assist to move right LE and at times pt did not, pt needed assist to shift weight.  Also needed cues to look up and keep shoulders back as pt tended to flex trunk and  head as he fatigues. ? ?Stairs ?  ?  ?  ?  ?  ? ?Wheelchair Mobility ?  ? ?Modified Rankin (Stroke Patients Only) ?Modified Rankin (Stroke Patients Only) ?Pre-Morbid Rankin Score: No  symptoms ?Modified Rankin: Severe disability ? ?  ? ?Balance Overall balance assessment: Needs assistance ?Sitting-balance support: Feet supported, Single extremity supported ?Sitting balance-Leahy Scale: Poor ?Sitting balance - Comments: intermittent use of LUE while seated EOB (statically), he could move hand off of bed onto lap with min guard A.  right UE flaccid.  Sitting balance overall min guard assist ?  ?Standing balance support: Single extremity supported, During functional activity ?Standing balance-Leahy Scale: Poor ?Standing balance comment: Mod A sit<>stand and maintain static standing.  Dynamic mod assist of 2 ?  ?  ?  ?  ?  ?  ?  ?  ?  ?  ?  ?   ? ? ? ?Pertinent Vitals/Pain Pain Assessment ?Pain Assessment: No/denies pain  ? ? ?Home Living Family/patient expects to be discharged to:: Private residence ?Living Arrangements: Spouse/significant other;Children ?Available Help at Discharge: Family;Available 24 hours/day ?Type of Home: House ?Home Access: Stairs to enter ?Entrance Stairs-Rails: Left (as you ascend) ?Entrance Stairs-Number of Steps: 3 ?  ?Home Layout: One level ?Home Equipment: Kasandra Knudsen - single point;Shower seat ?Additional Comments: RW-4 wheels, no seat. Just returned from [place] 4 days ago  ?  ?Prior Function Prior Level of Function : Independent/Modified Independent ?  ?  ?  ?  ?  ?  ?  ?  ?  ? ? ?Hand Dominance  ? Dominant Hand: Right ? ?  ?Extremity/Trunk Assessment  ? Upper Extremity Assessment ?Upper Extremity Assessment: Defer to OT evaluation ?RUE Deficits / Details: 0/5 for all except shoulder extension, shoulder abduction, and shoulder internal rotation (all 1/5). Sensation intact, PROM WNL all joints, no subluxation ?RUE Coordination: decreased gross motor;decreased fine motor ?  ? ?Lower Extremity Assessment ?Lower Extremity Assessment: LLE deficits/detail;RLE deficits/detail ?RLE Deficits / Details: grossly 2/5 ?RLE Sensation: decreased light touch ?RLE Coordination: decreased  gross motor ?  ? ?Cervical / Trunk Assessment ?Cervical / Trunk Assessment: Kyphotic  ?Communication  ? Communication: No difficulties  ?Cognition Arousal/Alertness: Awake/alert ?Behavior During Therapy: [facility] for tasks assessed/performed ?Overall Cognitive Status: Within Functional Limits for tasks assessed ?  ?  ?  ?  ?  ?  ?  ?  ?  ?  ?  ?  ?  ?  ?  ?  ?  ?  ?  ? ?  ?General Comments   ? ?  ?Exercises Other Exercises ?Other Exercises: Educated son in room on AAROM for RUE and RLE, pillow under right UE  ? ?Assessment/Plan  ?  ?PT Assessment Patient needs continued PT services  ?PT Problem List Decreased balance;Decreased mobility;Decreased activity tolerance;Decreased strength;Decreased coordination;Decreased safety awareness;Decreased knowledge of use of DME;Decreased knowledge of precautions ? ?   ?  ?PT Treatment Interventions DME instruction;Gait training;Functional mobility training;Therapeutic activities;Therapeutic exercise;Balance training;Patient/family education;Wheelchair mobility training   ? ?PT Goals (Current goals can be found in the Care Plan section)  ?Acute Rehab PT Goals ?Patient Stated Goal: to go home ?PT Goal Formulation: With patient ?Time For Goal Achievement: 05/12/21 ?Potential to Achieve Goals: Good ? ?  ?Frequency Min 4X/week ?  ? ? ?Co-evaluation   ?  ?  ?  ?  ? ? ?  ?AM-PAC PT "6 Clicks" Mobility  ?Outcome Measure Help needed turning from your back to your side while in a  flat bed without using bedrails?: A Little ?Help needed moving from lying on your back to sitting on the side of a flat bed without using bedrails?: A Lot ?Help needed moving to and from a bed to a chair (including a wheelchair)?: Total ?Help needed standing up from a chair using your arms (e.g., wheelchair or bedside chair)?: A Lot ?Help needed to walk in hospital room?: Total ?Help needed climbing 3-5 steps with a railing? : Total ?6 Click Score: 10 ? ?  ?End of Session Equipment Utilized During Treatment: Gait  belt ?Activity Tolerance: Patient limited by fatigue ?Patient left: in chair;with call bell/phone within reach;with chair alarm set;with family/visitor present ?Nurse Communication: Mobility status ?PT Visit Diagnosis: Steward Ros

## 2021-04-28 NOTE — Evaluation (Signed)
Occupational Therapy Evaluation ?Patient Details ?Name: Aaron Dennis ?MRN: [id number] ?DOB: 11-19-1955 ?Today's Date: 04/28/2021 ? ? ?History of Present Illness Aaron Dennis is a 66 y.o. male presents to [facility] with right sided weakness. QIH:KVQQV infarct in the left frontoparietal white matter. Mild associated edema without mass effect. PMHx: hypertension, reflux disease, polycythemia and bilateral knee osteoarthritis.  ? ?Clinical Impression ?  ?This 66 yo male admitted with above presents to acute OT with PLOF of being totally independent with basic ADLs, IADLs, and driving. He currently has only minimal movement of RUE at shoulder and limited movement of RLE (2/5), decreased sitting and standing balance as well as decreased ability to track and maintain eyes to right. He will continue to benefit from acute OT with follow up on AIR.  ?   ? ?Recommendations for follow up therapy are one component of a multi-disciplinary discharge planning process, led by the attending physician.  Recommendations may be updated based on patient status, additional functional criteria and insurance authorization.  ? ?Follow Up Recommendations ? Acute inpatient rehab (3hours/day)  ?  ?Assistance Recommended at Discharge Frequent or constant Supervision/Assistance  ?Patient can return home with the following A lot of help with walking and/or transfers;A lot of help with bathing/dressing/bathroom;Assist for transportation;Assistance with cooking/housework;Help with stairs or ramp for entrance ? ?  ?Functional Status Assessment ? Patient has had a recent decline in their functional status and demonstrates the ability to make significant improvements in function in a reasonable and predictable amount of time.  ?Equipment Recommendations ? Other (comment) (TBD next venue)  ?  ?   ?Precautions / Restrictions Precautions ?Precautions: Fall ?Restrictions ?Weight Bearing Restrictions: No  ? ?  ? ?Mobility Bed Mobility ?Overal bed mobility: Needs  Assistance ?Bed Mobility: Rolling, Sidelying to Sit, Sit to Sidelying ?Rolling: Min assist (to right) ?Sidelying to sit: Mod assist ?  ?  ?Sit to sidelying: Mod assist ?General bed mobility comments: VCs for sequencing ?  ? ?Transfers ?Overall transfer level: Needs assistance ?Equipment used: 1 person hand held assist ?Transfers: Sit to/from Stand ?Sit to Stand: Mod assist ?  ?  ?  ?  ?  ?General transfer comment: Mod A stand>sit diagonally to get up to [facility] (x2) ?  ? ?  ?Balance Overall balance assessment: Needs assistance ?Sitting-balance support: Feet supported, Single extremity supported ?Sitting balance-Leahy Scale: Poor ?Sitting balance - Comments: intermittent use of LUE while seated EOB (statically), he could move hand off of bed onto lap with min guard A ?  ?Standing balance support: Single extremity supported ?Standing balance-Leahy Scale: Poor ?Standing balance comment: Mod A sit<>stand and maintain static standing ?  ?  ?  ?  ?  ?  ?  ?  ?  ?  ?  ?   ? ?ADL either performed or assessed with clinical judgement  ? ?ADL Overall ADL's : Needs assistance/impaired ?Eating/Feeding: Set up;Bed level ?Eating/Feeding Details (indicate cue type and reason): or supported sitting ?Grooming: Moderate assistance;Bed level ?Grooming Details (indicate cue type and reason): or supported sitting ?Upper Body Bathing: Moderate assistance;Bed level ?Upper Body Bathing Details (indicate cue type and reason): or supported sitting ?Lower Body Bathing: Moderate assistance;Sit to/from stand ?Lower Body Bathing Details (indicate cue type and reason): supported sitting ?Upper Body Dressing : Maximal assistance;Bed level ?Upper Body Dressing Details (indicate cue type and reason): or supported sitting ?Lower Body Dressing: Maximal assistance ?Lower Body Dressing Details (indicate cue type and reason): Mod A sit<>stand, supported sitting ?Toilet Transfer: Moderate  assistance ?  ?Toileting- Clothing Manipulation and Hygiene: Total  assistance ?Toileting - Clothing Manipulation Details (indicate cue type and reason): Mod A sit<>stand ?  ?  ?  ?   ? ? ? ?Vision Baseline Vision/History: 1 Wears glasses ?Ability to See in Adequate Light: 0 Adequate ?Patient Visual Report: No change from baseline ?Vision Assessment?: Yes ?Eye Alignment: Within Functional Limits ?Ocular Range of Motion: Within Functional Limits ?Alignment/Gaze Preference: Head tilt (left) ?Tracking/Visual Pursuits:  (cannot track and hold to right) ?Visual Fields: No apparent deficits  ?   ?   ?   ? ?Pertinent Vitals/Pain Pain Assessment ?Pain Assessment: Faces ?Faces Pain Scale: Hurts a little bit ?Pain Location: right ankle ?Pain Descriptors / Indicators: Sore ?Pain Intervention(s): Limited activity within patient's tolerance, Monitored during session, Repositioned  ? ? ? ?Hand Dominance Right ?  ?Extremity/Trunk Assessment Upper Extremity Assessment ?Upper Extremity Assessment: RUE deficits/detail ?RUE Deficits / Details: 0/5 for all except shoulder extension, shoulder abduction, and shoulder internal rotation (all 1/5). Sensation intact, PROM WNL all joints, no subluxation ?RUE Coordination: decreased gross motor;decreased fine motor ?  ?  ?  ?  ?  ?Communication Communication ?Communication: No difficulties ?  ?Cognition Arousal/Alertness: Awake/alert ?Behavior During Therapy: [facility] for tasks assessed/performed ?Overall Cognitive Status: Within Functional Limits for tasks assessed ?  ?  ?  ?  ?  ?  ?  ?  ?  ?  ?  ?  ?  ?  ?  ?  ?  ?  ?  ?   ?Exercises Other Exercises ?Other Exercises: Educated son in room on AAROM for RUE and RLE ?  ?   ? ? ?Home Living Family/patient expects to be discharged to:: Private residence ?Living Arrangements: Spouse/significant other;Children ?Available Help at Discharge: Family;Available 24 hours/day ?Type of Home: House ?Home Access: Stairs to enter ?Entrance Stairs-Number of Steps: 3 ?Entrance Stairs-Rails: Left (as you ascend) ?Home Layout: One  level ?  ?  ?Bathroom Shower/Tub: Tub/shower unit;Curtain ?  ?Bathroom Toilet: Standard ?  ?  ?Home Equipment: Kasandra Knudsen - single point;Shower seat ?  ?Additional Comments: RW-4 wheels, no seat. Just returned from [place] 4 days ago ?  ? ?  ?Prior Functioning/Environment Prior Level of Function : Independent/Modified Independent ?  ?  ?  ?  ?  ?  ?  ?  ?  ? ?  ?  ?OT Problem List: Decreased strength;Decreased range of motion;Impaired balance (sitting and/or standing);Impaired vision/perception;Decreased coordination;Impaired UE functional use;Impaired tone;Pain ?  ?   ?OT Treatment/Interventions: Self-care/ADL training;DME and/or AE instruction;Patient/family education;Balance training;Therapeutic activities;Neuromuscular education  ?  ?OT Goals(Current goals can be found in the care plan section) Acute Rehab OT Goals ?Patient Stated Goal: to get right side working better ?OT Goal Formulation: With patient ?Time For Goal Achievement: 05/12/21 ?Potential to Achieve Goals: Good  ?OT Frequency: Min 2X/week ?  ? ?   ?AM-PAC OT "6 Clicks" Daily Activity     ?Outcome Measure Help from another person eating meals?: A Little ?Help from another person taking care of personal grooming?: A Lot ?Help from another person toileting, which includes using toliet, bedpan, or urinal?: A Lot ?Help from another person bathing (including washing, rinsing, drying)?: A Lot ?Help from another person to put on and taking off regular upper body clothing?: A Lot ?Help from another person to put on and taking off regular lower body clothing?: Total ?6 Click Score: 12 ?  ?End of Session Equipment Utilized During Treatment: Gait belt ?Nurse Communication:  Mobility status (use of sara stedy for transfers) ? ?Activity Tolerance: Patient tolerated treatment well ?Patient left: in bed;with call bell/phone within reach;with bed alarm set ? ?OT Visit Diagnosis: Unsteadiness on feet (R26.81);Other abnormalities of gait and mobility (R26.89);Muscle weakness  (generalized) (M62.81);Pain;Hemiplegia and hemiparesis ?Hemiplegia - Right/Left: Right ?Hemiplegia - dominant/non-dominant: Dominant ?Hemiplegia - caused by: Cerebral infarction ?Pain - Right/Left: Right ?Pain - p

## 2021-04-28 NOTE — Progress Notes (Signed)
?  Transition of Care (TOC) Screening Note ? ? ?Patient Details  ?Name: Aaron Dennis ?Date of Birth: August 14, 1955 ? ? ?Transition of Care (TOC) CM/SW Contact:    ?Cyndi Bender, RN ?Phone Number: ?04/28/2021, 2:21 PM ? ? ? ?Transition of Care Department [facility]) has reviewed patient  ?CIR following  ?

## 2021-04-28 NOTE — Progress Notes (Incomplete)
STROKE TEAM PROGRESS NOTE  ? ?HISTORY OF PRESENT ILLNESS (per record) ?*** ? ? ?INTERVAL HISTORY ?His *** is at the bedside.  *** ? ? ? ?OBJECTIVE ?Vitals:  ? 04/27/21 2000 04/27/21 2256 04/28/21 0105 04/28/21 0400  ?BP: 118/85 121/80 (!) 115/91   ?Pulse: 79 73 75   ?Resp: '14 19 19 20  '$ ?Temp:  98.5 ?F (36.9 ?C) 98.2 ?F (36.8 ?C)   ?TempSrc:  Oral Oral   ?SpO2: 99% 100% 98%   ?Weight:  83.9 kg    ?Height:  '5\' 10"'$  (1.778 m)    ? ? ?CBC:  ?Recent Labs  ?Lab 04/27/21 ?1012 04/27/21 ?1124 04/28/21 ?0092  ?WBC 8.2  --  6.9  ?NEUTROABS 5.5  --   --   ?HGB 18.1* 18.7* 17.3*  ?HCT 54.4* 55.0* 52.5*  ?MCV 82.9  --  82.9  ?PLT 270  --  226  ? ? ?Basic Metabolic Panel:  ?Recent Labs  ?Lab 04/27/21 ?1012 04/27/21 ?1124 04/28/21 ?3300  ?NA 139 142 138  ?K 3.4* 3.3* 3.2*  ?CL 105 105 105  ?CO2 25  --  23  ?GLUCOSE 94 86 82  ?BUN '9 11 11  '$ ?CREATININE 0.91 0.80 0.93  ?CALCIUM 9.2  --  8.8*  ? ? ?Lipid Panel:  ?   ?Component Value Date/Time  ? CHOL 159 04/27/2021 1649  ? CHOL 198 06/26/2020 0944  ? TRIG 52 04/27/2021 1649  ? HDL 29 (L) 04/27/2021 1649  ? HDL 38 (L) 06/26/2020 0944  ? CHOLHDL 5.5 04/27/2021 1649  ? VLDL 10 04/27/2021 1649  ? [place] 120 (H) 04/27/2021 1649  ? [place] 147 (H) 06/26/2020 0944  ? ?HgbA1c:  ?Lab Results  ?Component Value Date  ? HGBA1C 4.9 04/27/2021  ? ?Urine Drug Screen:  ?   ?Component Value Date/Time  ? [place] DETECTED 04/28/2021 0140  ? [place] DETECTED 04/28/2021 0140  ? LABBENZ NONE DETECTED 04/28/2021 0140  ? AMPHETMU NONE DETECTED 04/28/2021 0140  ? [place] DETECTED 04/28/2021 0140  ? LABBARB NONE DETECTED 04/28/2021 0140  ?  ?Alcohol Level No results found for: ETH ? ?IMAGING ? ? ?CT ANGIO HEAD NECK W WO CM ? ?Result Date: 04/27/2021 ?CLINICAL DATA:  Stroke/TIA, determine embolic source EXAM: CT ANGIOGRAPHY HEAD AND NECK TECHNIQUE: Multidetector CT imaging of the head and neck was performed using the standard protocol during bolus administration of intravenous contrast.  Multiplanar CT image reconstructions and MIPs were obtained to evaluate the vascular anatomy. Carotid stenosis measurements (when applicable) are obtained utilizing NASCET criteria, using the distal internal carotid diameter as the denominator. RADIATION DOSE REDUCTION: This exam was performed according to the departmental dose-optimization program which includes automated exposure control, adjustment of the mA and/or kV according to patient size and/or use of iterative reconstruction technique. CONTRAST:  47m OMNIPAQUE IOHEXOL 350 MG/ML SOLN COMPARISON:  Same day MRI and CT. FINDINGS: CTA NECK FINDINGS Aortic arch: Great vessel origins are patent without significant stenosis. Right carotid system: No evidence of dissection, stenosis (50% or greater) or occlusion. Left carotid system: No evidence of dissection, stenosis (50% or greater) or occlusion. Vertebral arteries: Right dominant. No evidence of dissection, stenosis (50% or greater) or occlusion. Skeleton: Chronic mixed lytic and sclerotic changes throughout the skeleton, which could represent myelofibrosis or metabolic bone disorder. Other neck: No acute findings. Upper chest: Visualized lung apices are clear. Review of the MIP images confirms the above findings CTA HEAD FINDINGS Anterior circulation: Hypoplastic or absent left A1 ACA, probably  congenital given prominent right A1 ACA supplying bilateral A2 ACAs. Otherwise, bilateral intracranial ICAs, MCAs, and ACAs are patent without proximal hemodynamically significant stenosis. 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 Moderate stenosis of the distal left intradural vertebral artery. Moderate stenosis of the mid basilar artery. Bilateral posterior cerebral arteries are patent with mild left P1 PCA stenosis and moderate left proximal P2 PCA  stenosis. Venous sinuses: Not well evaluated due to arterial timing. Anatomic variants: Detailed above. Review of the MIP images confirms the above findings IMPRESSION: 1. No large vessel occlusion. 2. Moderate stenosis of the distal left intradural vertebral artery, mid basilar artery and proximal left P2 PCA. 3. 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 4. Chronic mixed lytic and sclerotic changes throughout the skeleton, which could represent myelofibrosis or metabolic bone disorder. Findings further evaluated on prior PET-CT. Electronically Signed   By: Margaretha Sheffield M.D.   On: 04/27/2021 17:34  ? ?CT HEAD WO CONTRAST ? ?Result Date: 04/27/2021 ?CLINICAL DATA:  Neurological deficit, right-sided weakness EXAM: CT HEAD WITHOUT CONTRAST TECHNIQUE: Contiguous axial images were obtained from the base of the skull through the vertex without intravenous contrast. RADIATION DOSE REDUCTION: This exam was performed according to the departmental dose-optimization program which includes automated exposure control, adjustment of the mA and/or kV according to patient size and/or use of iterative reconstruction technique. COMPARISON:  07/12/2017 FINDINGS: Brain: No acute intracranial findings are seen. Cortical sulci are prominent. There is decreased density in periventricular and subcortical white matter. There are small foci of decreased density in the basal ganglia on both sides. There is no focal mass effect. There is no shift of midline structures. Vascular: Unremarkable. Skull: Unremarkable. Sinuses/Orbits: There is possible old blowout fracture in the medial wall of left orbit. Other: None IMPRESSION: No acute intracranial findings are seen in the noncontrast CT brain. Atrophy. Small-vessel disease. Electronically Signed   By: Elmer Picker M.D.   On: 04/27/2021 10:51  ? ?MR BRAIN WO CONTRAST ? ?Result Date: 04/27/2021 ?CLINICAL  DATA:  Neuro deficit, acute, stroke suspected EXAM: MRI HEAD WITHOUT CONTRAST TECHNIQUE: Multiplanar, multiecho pulse sequences of the brain and surrounding structures were obtained without intravenous contrast. COMPARISON:  Same day head CT. FINDINGS: Brain: Acute infarct in the left frontoparietal white matter. Mild associated edema without mass effect. Additional scattered patchy T2/FLAIR hyperintensities within the white matter. Small foci of encephalomalacia in the periatrial white matter bilaterally. Cerebral atrophy no evidence of acute hemorrhage, mass lesion, midline shift or extra-axial fluid collection. Vascular: Major arterial flow voids are maintained at the skull base. Skull and upper cervical spine: Diffuse T1 hypointensity the bone marrow without focal marrow replacing lesion. Sinuses/Orbits: Remote left medial orbital wall fracture. Clear sinuses. Other: No mastoid effusions. IMPRESSION: 1. Acute infarct in the left frontoparietal white matter. Mild associated edema without mass effect. 2. Additional moderate scattered T2/FLAIR hyperintensities the white matter with small foci of encephalomalacia in the periatrial white matter bilateral. Findings are nonspecific but most likely related to chronic microvascular disease. Chronic demyelination is a less likely differential consideration. 3. Diffuse T1 hypointensity of the bone marrow. This finding is nonspecific, but can be seen with chronic anemia, chronic hypoxia, or underlying lymphoproliferative disorder. Electronically Signed   By: Margaretha Sheffield M.D.   On: 04/27/2021 14:55  ? ?  ECHOCARDIOGRAM COMPLETE BUBBLE STUDY ? ?Result Date: 04/27/2021 ?   ECHOCARDIOGRAM REPORT   Patient Name:   Aaron Dennis Date of Exam: 04/27/2021 Medical Rec #:  [id number]     Height:       70.0 in Accession #:    [id number]    Weight:       189.0 lb Date of Birth:  [birth year]      BSA:          2.038 m? Patient Age:    66 years      BP:           125/82 mmHg Patient  Gender: M             HR:           81 bpm. Exam Location:  Inpatient Procedure: 2D Echo, Cardiac Doppler, Color Doppler and [place] Contrast Bubble            Study Indications:    Stroke  History:        Patient has no

## 2021-04-28 NOTE — Plan of Care (Signed)

## 2021-04-28 NOTE — Progress Notes (Signed)
Inpatient Rehab Admissions Coordinator:   Per therapy  patient was screened for CIR candidacy by Junelle Hashemi, MS, CCC-SLP . At this time, Pt. Appears to be a a potential candidate for CIR. I will place   order for rehab consult per protocol for full assessment. Please contact me any with questions.  Paralee Pendergrass, MS, CCC-SLP Rehab Admissions Coordinator  [phone] (celll) [phone] (office)  

## 2021-04-28 NOTE — Progress Notes (Signed)
Blood pull off for therapeutic phlebotomy start time 2021 end time of 2040 with a total of 500 cc removed.  Patient tolerated well.  Verdis Frederickson, RN aware that IV fluids for post removal need to be given now. ?

## 2021-04-29 ENCOUNTER — Inpatient Hospital Stay (HOSPITAL_COMMUNITY)
Admission: RE | Admit: 2021-04-29 | Discharge: 2021-05-20 | DRG: 057 | Disposition: A | Discharge status: Home Health | Payer: Medicare Other | Source: Intra-hospital | Attending: Physical Medicine and Rehabilitation | Admitting: Physical Medicine and Rehabilitation

## 2021-04-29 ENCOUNTER — Encounter (HOSPITAL_COMMUNITY): Payer: Self-pay | Admitting: Physical Medicine and Rehabilitation

## 2021-04-29 ENCOUNTER — Other Ambulatory Visit: Payer: Self-pay

## 2021-04-29 ENCOUNTER — Other Ambulatory Visit (HOSPITAL_COMMUNITY): Payer: Self-pay

## 2021-04-29 ENCOUNTER — Encounter: Payer: Self-pay | Admitting: Internal Medicine

## 2021-04-29 DIAGNOSIS — K5901 Slow transit constipation: Secondary | ICD-10-CM | POA: Diagnosis not present

## 2021-04-29 DIAGNOSIS — K5900 Constipation, unspecified: Secondary | ICD-10-CM | POA: Diagnosis present

## 2021-04-29 DIAGNOSIS — I639 Cerebral infarction, unspecified: Secondary | ICD-10-CM | POA: Diagnosis present

## 2021-04-29 DIAGNOSIS — N400 Enlarged prostate without lower urinary tract symptoms: Secondary | ICD-10-CM | POA: Diagnosis present

## 2021-04-29 DIAGNOSIS — R531 Weakness: Principal | ICD-10-CM

## 2021-04-29 DIAGNOSIS — Z833 Family history of diabetes mellitus: Secondary | ICD-10-CM

## 2021-04-29 DIAGNOSIS — I63412 Cerebral infarction due to embolism of left middle cerebral artery: Secondary | ICD-10-CM

## 2021-04-29 DIAGNOSIS — E785 Hyperlipidemia, unspecified: Secondary | ICD-10-CM | POA: Diagnosis not present

## 2021-04-29 DIAGNOSIS — D751 Secondary polycythemia: Secondary | ICD-10-CM | POA: Diagnosis not present

## 2021-04-29 DIAGNOSIS — I69398 Other sequelae of cerebral infarction: Principal | ICD-10-CM

## 2021-04-29 DIAGNOSIS — R338 Other retention of urine: Secondary | ICD-10-CM | POA: Diagnosis present

## 2021-04-29 DIAGNOSIS — Z20822 Contact with and (suspected) exposure to covid-19: Secondary | ICD-10-CM | POA: Diagnosis not present

## 2021-04-29 DIAGNOSIS — Z87891 Personal history of nicotine dependence: Secondary | ICD-10-CM | POA: Diagnosis not present

## 2021-04-29 DIAGNOSIS — Z79899 Other long term (current) drug therapy: Secondary | ICD-10-CM | POA: Diagnosis not present

## 2021-04-29 DIAGNOSIS — Z8673 Personal history of transient ischemic attack (TIA), and cerebral infarction without residual deficits: Secondary | ICD-10-CM

## 2021-04-29 DIAGNOSIS — K219 Gastro-esophageal reflux disease without esophagitis: Secondary | ICD-10-CM | POA: Diagnosis present

## 2021-04-29 DIAGNOSIS — I10 Essential (primary) hypertension: Secondary | ICD-10-CM | POA: Diagnosis not present

## 2021-04-29 DIAGNOSIS — I671 Cerebral aneurysm, nonruptured: Secondary | ICD-10-CM | POA: Diagnosis not present

## 2021-04-29 DIAGNOSIS — Q782 Osteopetrosis: Secondary | ICD-10-CM | POA: Diagnosis not present

## 2021-04-29 DIAGNOSIS — G4700 Insomnia, unspecified: Secondary | ICD-10-CM | POA: Diagnosis not present

## 2021-04-29 DIAGNOSIS — I69392 Facial weakness following cerebral infarction: Secondary | ICD-10-CM | POA: Diagnosis not present

## 2021-04-29 DIAGNOSIS — G936 Cerebral edema: Secondary | ICD-10-CM | POA: Insufficient documentation

## 2021-04-29 DIAGNOSIS — N401 Enlarged prostate with lower urinary tract symptoms: Secondary | ICD-10-CM | POA: Diagnosis present

## 2021-04-29 DIAGNOSIS — F32A Depression, unspecified: Secondary | ICD-10-CM | POA: Diagnosis present

## 2021-04-29 DIAGNOSIS — E559 Vitamin D deficiency, unspecified: Secondary | ICD-10-CM | POA: Diagnosis present

## 2021-04-29 DIAGNOSIS — F329 Major depressive disorder, single episode, unspecified: Secondary | ICD-10-CM | POA: Diagnosis not present

## 2021-04-29 DIAGNOSIS — I2510 Atherosclerotic heart disease of native coronary artery without angina pectoris: Secondary | ICD-10-CM | POA: Diagnosis not present

## 2021-04-29 DIAGNOSIS — E876 Hypokalemia: Secondary | ICD-10-CM | POA: Diagnosis not present

## 2021-04-29 DIAGNOSIS — M549 Dorsalgia, unspecified: Secondary | ICD-10-CM | POA: Diagnosis not present

## 2021-04-29 DIAGNOSIS — M25561 Pain in right knee: Secondary | ICD-10-CM | POA: Diagnosis not present

## 2021-04-29 LAB — COMPREHENSIVE METABOLIC PANEL
ALT: 11 U/L (ref 0–44)
AST: 16 U/L (ref 15–41)
Albumin: 3.1 g/dL — ABNORMAL LOW (ref 3.5–5.0)
Alkaline Phosphatase: 87 U/L (ref 38–126)
Anion gap: 7 (ref 5–15)
BUN: 17 mg/dL (ref 8–23)
CO2: 22 mmol/L (ref 22–32)
Calcium: 8.5 mg/dL — ABNORMAL LOW (ref 8.9–10.3)
Chloride: 107 mmol/L (ref 98–111)
Creatinine, Ser: 0.97 mg/dL (ref 0.61–1.24)
GFR, Estimated: >60 mL/min (ref >60)
Glucose, Bld: 91 mg/dL (ref 70–99)
Potassium: 3.7 mmol/L (ref 3.5–5.1)
Sodium: 136 mmol/L (ref 135–145)
Total Bilirubin: 2.4 mg/dL — ABNORMAL HIGH (ref 0.3–1.2)
Total Protein: 5.8 g/dL — ABNORMAL LOW (ref 6.5–8.1)

## 2021-04-29 LAB — CBC
HCT: 46.3 % (ref 39.0–52.0)
Hemoglobin: 15.6 g/dL (ref 13.0–17.0)
MCH: 27.6 pg (ref 26.0–34.0)
MCHC: 33.7 g/dL (ref 30.0–36.0)
MCV: 81.9 fL (ref 80.0–100.0)
Platelets: 222 10*3/uL (ref 150–400)
RBC: 5.65 MIL/uL (ref 4.22–5.81)
RDW: 13.7 % (ref 11.5–15.5)
WBC: 6.6 10*3/uL (ref 4.0–10.5)
nRBC: 0 % (ref 0.0–0.2)

## 2021-04-29 LAB — ERYTHROPOIETIN: Erythropoietin: 13.8 m[IU]/mL (ref 2.6–18.5)

## 2021-04-29 MED ORDER — TRAZODONE HCL 50 MG PO TABS: 25.0000 mg | ORAL_TABLET | Freq: Every evening | ORAL | Status: DC | PRN

## 2021-04-29 MED ORDER — CLOPIDOGREL BISULFATE 75 MG PO TABS
75.0000 mg | ORAL_TABLET | Freq: Every day | ORAL | 0 refills | Status: DC
  Filled 2021-04-29: qty 21, 21d supply, fill #0

## 2021-04-29 MED ORDER — CLOPIDOGREL BISULFATE 75 MG PO TABS
75.0000 mg | ORAL_TABLET | Freq: Every day | ORAL | Status: DC
  Administered 2021-04-30 – 2021-05-20 (×21): 75 mg via ORAL
  Filled 2021-04-29 (×21): qty 1

## 2021-04-29 MED ORDER — DIPHENHYDRAMINE HCL 12.5 MG/5ML PO ELIX: 12.5000 mg | ORAL_SOLUTION | Freq: Four times a day (QID) | ORAL | Status: DC | PRN

## 2021-04-29 MED ORDER — PANTOPRAZOLE SODIUM 40 MG PO TBEC
40.0000 mg | DELAYED_RELEASE_TABLET | Freq: Every day | ORAL | Status: DC
Start: 2021-04-30 — End: 2021-05-20
  Administered 2021-04-30 – 2021-05-20 (×21): 40 mg via ORAL
  Filled 2021-04-29 (×21): qty 1

## 2021-04-29 MED ORDER — BISACODYL 10 MG RE SUPP: 10.0000 mg | Freq: Every day | RECTAL | Status: DC | PRN

## 2021-04-29 MED ORDER — ACETAMINOPHEN 325 MG PO TABS
325.0000 mg | ORAL_TABLET | ORAL | Status: DC | PRN
  Administered 2021-05-01 (×2): 650 mg via ORAL
  Administered 2021-05-05: 325 mg via ORAL
  Administered 2021-05-14 – 2021-05-19 (×3): 650 mg via ORAL
  Filled 2021-04-29: qty 2
  Filled 2021-04-29: qty 1
  Filled 2021-04-29 (×5): qty 2

## 2021-04-29 MED ORDER — TAMSULOSIN HCL 0.4 MG PO CAPS
0.4000 mg | ORAL_CAPSULE | Freq: Every day | ORAL | Status: DC
  Administered 2021-04-30 – 2021-05-20 (×21): 0.4 mg via ORAL
  Filled 2021-04-29 (×21): qty 1

## 2021-04-29 MED ORDER — ROSUVASTATIN CALCIUM 20 MG PO TABS
20.0000 mg | ORAL_TABLET | Freq: Every day | ORAL | Status: DC
  Administered 2021-04-29: 20 mg via ORAL
  Filled 2021-04-29: qty 1

## 2021-04-29 MED ORDER — PROCHLORPERAZINE EDISYLATE 10 MG/2ML IJ SOLN: 5.0000 mg | Freq: Four times a day (QID) | INTRAMUSCULAR | Status: DC | PRN

## 2021-04-29 MED ORDER — ENOXAPARIN SODIUM 40 MG/0.4ML IJ SOSY
40.0000 mg | PREFILLED_SYRINGE | INTRAMUSCULAR | Status: DC
  Administered 2021-04-29 – 2021-05-19 (×21): 40 mg via SUBCUTANEOUS
  Filled 2021-04-29 (×21): qty 0.4

## 2021-04-29 MED ORDER — LACTATED RINGERS IV SOLN
INTRAVENOUS | Status: AC
Start: 2021-04-29 — End: 2021-04-29

## 2021-04-29 MED ORDER — GUAIFENESIN-DM 100-10 MG/5ML PO SYRP: 5.0000 mL | ORAL_SOLUTION | Freq: Four times a day (QID) | ORAL | Status: DC | PRN

## 2021-04-29 MED ORDER — AMLODIPINE BESYLATE 5 MG PO TABS
5.0000 mg | ORAL_TABLET | Freq: Every day | ORAL | Status: DC
Start: 2021-04-30 — End: 2021-05-20
  Administered 2021-04-30 – 2021-05-20 (×21): 5 mg via ORAL
  Filled 2021-04-29 (×21): qty 1

## 2021-04-29 MED ORDER — ASPIRIN 81 MG PO TBEC
81.0000 mg | DELAYED_RELEASE_TABLET | Freq: Every day | ORAL | 11 refills | Status: DC
Start: 2021-04-30 — End: 2021-05-19
  Filled 2021-04-29: qty 30, 30d supply, fill #0

## 2021-04-29 MED ORDER — FLEET ENEMA 7-19 GM/118ML RE ENEM: 1.0000 | ENEMA | Freq: Once | RECTAL | Status: DC | PRN

## 2021-04-29 MED ORDER — ROSUVASTATIN CALCIUM 20 MG PO TABS
20.0000 mg | ORAL_TABLET | Freq: Every day | ORAL | 2 refills | Status: DC
  Filled 2021-04-29: qty 30, 30d supply, fill #0

## 2021-04-29 MED ORDER — ASPIRIN EC 81 MG PO TBEC
81.0000 mg | DELAYED_RELEASE_TABLET | Freq: Every day | ORAL | Status: DC
Start: 2021-04-30 — End: 2021-05-20
  Administered 2021-04-30 – 2021-05-20 (×21): 81 mg via ORAL
  Filled 2021-04-29 (×21): qty 1

## 2021-04-29 MED ORDER — PROCHLORPERAZINE MALEATE 5 MG PO TABS: 5.0000 mg | ORAL_TABLET | Freq: Four times a day (QID) | ORAL | Status: DC | PRN

## 2021-04-29 MED ORDER — PROCHLORPERAZINE 25 MG RE SUPP: 12.5000 mg | Freq: Four times a day (QID) | RECTAL | Status: DC | PRN

## 2021-04-29 MED ORDER — ALUM & MAG HYDROXIDE-SIMETH 200-200-20 MG/5ML PO SUSP: 30.0000 mL | ORAL | Status: DC | PRN

## 2021-04-29 MED ORDER — POLYETHYLENE GLYCOL 3350 17 G PO PACK
17.0000 g | PACK | Freq: Every day | ORAL | Status: DC | PRN
  Administered 2021-05-09: 17 g via ORAL
  Filled 2021-04-29 (×2): qty 1

## 2021-04-29 MED ORDER — ROSUVASTATIN CALCIUM 20 MG PO TABS
20.0000 mg | ORAL_TABLET | Freq: Every day | ORAL | Status: DC
Start: 2021-04-30 — End: 2021-05-20
  Administered 2021-04-30 – 2021-05-20 (×21): 20 mg via ORAL
  Filled 2021-04-29 (×21): qty 1

## 2021-04-29 NOTE — Progress Notes (Addendum)
PMR Admission Coordinator Pre-Admission Assessment ?  ?Patient: Aaron Dennis is an 66 y.o., male ?MRN: [id number] ?DOB: 10-13-1955 ?Height: _0  (177.8 cm) ?Weight: 83.9 kg ?  ?Insurance Information ?HMO:     PPO:      PCP:      IPA:      80/20:      OTHER:  ?PRIMARY: U[employer] Medicare       Policy#: [id number]      Subscriber: Pt  ?CM Name:       Phone#: [phone]     Fax#: [phone] I spoke with Aaron Dennis at [facility] and received insurance auth for CIR admit 3/21-3/27 on 3/21 with updates due on the 27th ?Pre-Cert#: [id number]      Employer: n/a ?Eff Date: 02/09/2021 - still active ?Deductible: does not have one ?OOP Max: $8,300 ($0 met) ?CIR: $1,556/ admission copay ?SNF: $0.00 Copayment per day for days 1-20; $200 Copayment per day for days 21-100; Maximum of 100 days/benefit period ?Outpatient: 80% coverage; 20% co-insurance ?Home Health:  100% coverage, limited by medical necessity ?DME: 80% coverage; 20% co-insurance ?Providers: in network ? ?SECONDARY:   Medicaid [place] Access   Policy#:    [id number]  Phone#:  ?  ?Financial Counselor:       Phone#:  ?  ?The ?Data Collection Information Summary? for patients in Inpatient Rehabilitation Facilities with attached ?Privacy Act [place] Records? was provided and verbally reviewed with: Family ?  ?Emergency Contact Information ?Contact Information   ?  ?  Name Relation Home Work Mobile  ?  Aaron Dennis, Aaron Dennis [phone]      ?  ?   ?  ?  ?Current Medical History  ?Patient Admitting Diagnosis: CVA ?History of Present Illness: Aaron Dennis is a 66 y.o. male with a pertinent PMH of hypertension, reflux disease, polycythemia and bilateral knee osteoarthritis, who presented to [facility] ED 04/28/21  with right sided weakness. Patient reported five days of waxing and waning right sided weakness. Patient first noted the weakness when he was in [place] and that he had symptoms off and on during his flights returning to the [place].  In the ED, the patient was concerning  for stroke for which CT Head wo Contrast and MRI Brain obtained. CT Head negative but MRI did show an acute left frontoparietal stroke. Due to recent travel and concern for paradoxical stroke, DVT US was obtained - negative for DVT. Neurology consulted and patient admitted for further stroke work up . During admission, Pt also noted to have his polycythemia, with hemoglobin 18.1->18.7.  In the past, JAK2 mutation negative.  Erythropoietin negative.  He follows with Dr. Julien Dennis at [facility] long oncology, status post phlebotomies. Pt. Had therapeutic phlebotomy 04/28/21. CTA also showed 4x3 cerebral aneurysm. Stroke team discussed with Dr. Estanislado Dennis  with interventional radiology,  and recommendation was made to follow-up as outpatient Pt. Was seen by PT/OT/SLP who recommended CIR to assist return to PLOF.  ?  ?Complete NIHSS TOTAL: 7 ?  ?Patient's medical record from [facility]  has been reviewed by the rehabilitation admission coordinator and physician. ?  ?Past Medical History  ?    ?Past Medical History:  ?Diagnosis Date  ? GERD (gastroesophageal reflux disease)    ? Hypertension    ? Lung nodule    ?  7 mm LUL nodule  ?  ?  ?Has the patient had major surgery during 100 days prior to admission? No ?  ?Family History   ?family history  includes Diabetes in his brother; Healthy in his mother. ?  ?Current Medications ?  ?Current Facility-Administered Medications:  ?  acetaminophen (TYLENOL) tablet 650 mg, 650 mg, Oral, Q4H PRN, 650 mg at 04/27/21 2309 **OR** acetaminophen (TYLENOL) 160 MG/5ML solution 650 mg, 650 mg, Per Tube, Q4H PRN **OR** acetaminophen (TYLENOL) suppository 650 mg, 650 mg, Rectal, Q4H PRN, Aaron Dennis, Sadia, MD ?  amLODipine (NORVASC) tablet 5 mg, 5 mg, Oral, Daily, Aaron Dennis, Sadia, MD, 5 mg at 04/29/21 0931 ?  [COMPLETED] aspirin tablet 325 mg, 325 mg, Oral, Once, 325 mg at 04/27/21 1716 **FOLLOWED BY** aspirin EC tablet 81 mg, 81 mg, Oral, Daily, Aaron Dennis, Sadia, MD, 81 mg at 04/29/21 0931 ?   clopidogrel (PLAVIX) tablet 75 mg, 75 mg, Oral, Daily, Aaron Dennis, Sadia, MD, 75 mg at 04/29/21 0931 ?  enoxaparin (LOVENOX) injection 40 mg, 40 mg, Subcutaneous, Q24H, Aaron Dennis, Sadia, MD, 40 mg at 04/28/21 1826 ?  pantoprazole (PROTONIX) EC tablet 40 mg, 40 mg, Oral, Daily, Aaron Dennis, Sadia, MD, 40 mg at 04/29/21 0931 ?  rosuvastatin (CRESTOR) tablet 20 mg, 20 mg, Oral, Daily, Aaron Gordon, DO, 20 mg at 04/29/21 0932 ?  tamsulosin (FLOMAX) capsule 0.4 mg, 0.4 mg, Oral, Daily, Aaron Dennis, Sadia, MD, 0.4 mg at 04/29/21 0932 ?  ?Patients Current Diet:  ?Diet Order   ?  ?         ?    Diet Heart Room service appropriate? Yes; Fluid consistency: Thin  Diet effective now       ?  ?  ?   ?  ?  ?   ?  ?  ?Precautions / Restrictions ?Precautions ?Precautions: Fall ?Restrictions ?Weight Bearing Restrictions: No  ?  ?Has the patient had 2 or more falls or a fall with injury in the past year? No ?  ?Prior Activity Level ?Community (5-7x/wk): Pt activein the community PTA ?  ?Prior Functional Level ?Self Care: Did the patient need help bathing, dressing, using the toilet or eating? Independent ?  ?Indoor Mobility: Did the patient need assistance with walking from room to room (with or without device)? Independent ?  ?Stairs: Did the patient need assistance with internal or external stairs (with or without device)? Independent ?  ?Functional Cognition: Did the patient need help planning regular tasks such as shopping or remembering to take medications? Independent ?  ?Patient Information ?Are you of Hispanic, Latino/a,or Spanish origin?: A. No, not of Hispanic, Latino/a, or Spanish origin ?What is your race?: B. Black or African American ?Do you need or want an interpreter to communicate with a doctor or health care staff?: 0. No ?  ?Patient's Response To:  ?Health Literacy and Transportation ?Is the patient able to respond to health literacy and transportation needs?: Yes ?Health Literacy - How often do you need to have someone help you when  you read instructions, pamphlets, or other written material from your doctor or pharmacy?: Sometimes ?In the past 12 months, has lack of transportation kept you from medical appointments or from getting medications?: No ?In the past 12 months, has lack of transportation kept you from meetings, work, or from getting things needed for daily living?: No ?  ?Home Assistive Devices / Equipment ?Home Assistive Devices/Equipment: None ?Home Equipment: Kasandra Knudsen - single point, Shower seat ?  ?Prior Device Use: Indicate devices/aids used by the patient prior to current illness, exacerbation or injury? None of the above ?  ?Current Functional Level ?Cognition ?  Overall Cognitive Status: Within Functional Limits for tasks assessed ?Orientation  Level: Oriented X4 ?Attention: Focused, Sustained ?Focused Attention: Appears intact ?Sustained Attention: Appears intact ?Memory:  (Unable to assess) ?Problem Solving: Appears intact ?Executive Function: Reasoning ?Reasoning: Appears intact ?   ?Extremity Assessment ?(includes Sensation/Coordination) ?  Upper Extremity Assessment: Defer to OT evaluation ?RUE Deficits / Details: 0/5 for all except shoulder extension, shoulder abduction, and shoulder internal rotation (all 1/5). Sensation intact, PROM WNL all joints, no subluxation ?RUE Coordination: decreased gross motor, decreased fine motor  ?Lower Extremity Assessment: LLE deficits/detail, RLE deficits/detail ?RLE Deficits / Details: grossly 2/5 ?RLE Sensation: decreased light touch ?RLE Coordination: decreased gross motor  ?   ?ADLs ?  Overall ADL's : Needs assistance/impaired ?Eating/Feeding: Set up, Bed level ?Eating/Feeding Details (indicate cue type and reason): or supported sitting ?Grooming: Moderate assistance, Bed level ?Grooming Details (indicate cue type and reason): or supported sitting ?Upper Body Bathing: Moderate assistance, Bed level ?Upper Body Bathing Details (indicate cue type and reason): or supported sitting ?Lower  Body Bathing: Moderate assistance, Sit to/from stand ?Lower Body Bathing Details (indicate cue type and reason): supported sitting ?Upper Body Dressing : Maximal assistance, Bed level ?Upper Body Dressing

## 2021-04-29 NOTE — Discharge Summary (Signed)
? ?Name: GABRIELA GIANNELLI ?MRN: [id number] ?DOB: 06-24-1955 66 y.o. ?PCP: Charlott Rakes, MD ? ?Date of Admission: 04/27/2021  9:56 AM ?Date of Discharge:  04/29/2021 ?Attending Physician: Dr. Angelia Mould ? ?DISCHARGE DIAGNOSIS:  ?Primary Problem: Acute CVA (cerebrovascular accident) ([place])  ? ?Hospital Problems: ?Principal Problem: ?  Acute CVA (cerebrovascular accident) ([place]) ?Active Problems: ?  Essential hypertension ?  Polycythemia ?  ? ?DISCHARGE MEDICATIONS:  ? ?Allergies as of 04/29/2021   ?No Known Allergies ?  ? ?  ?Medication List  ?  ? ?STOP taking these medications   ? ?atorvastatin 10 MG tablet ?Commonly known as: LIPITOR ?  ? ?  ? ?TAKE these medications   ? ?acetaminophen 500 MG tablet ?Commonly known as: TYLENOL ?Take 1,000 mg by mouth every 6 (six) hours as needed for mild pain or headache. ?  ?amLODipine 5 MG tablet ?Commonly known as: NORVASC ?Take 1 tablet (5 mg total) by mouth daily. ?  ?aspirin 81 MG EC tablet ?Take 1 tablet (81 mg total) by mouth daily. Swallow whole. ?Start taking on: April 30, 2021 ?  ?baclofen 10 MG tablet ?Commonly known as: LIORESAL ?Take 0.5-1 tablets (5-10 mg total) by mouth at bedtime as needed for muscle spasms. ?  ?clopidogrel 75 MG tablet ?Commonly known as: PLAVIX ?Take 1 tablet (75 mg total) by mouth daily. ?Start taking on: April 30, 2021 ?  ?ergocalciferol 1.25 MG (50000 UT) capsule ?Commonly known as: Drisdol ?Take 1 capsule (50,000 Units total) by mouth once a week. ?  ?finasteride 5 MG tablet ?Commonly known as: PROSCAR ?Take 5 mg by mouth daily. ?  ?hydrocortisone-pramoxine 2.5-1 % rectal cream ?Commonly known as: Analpram HC ?Place 1 application rectally 3 (three) times daily. ?  ?meclizine 25 MG tablet ?Commonly known as: ANTIVERT ?Take 1 tablet (25 mg total) by mouth 2 (two) times daily as needed for dizziness. ?  ?meloxicam 7.5 MG tablet ?Commonly known as: MOBIC ?Take 1 tablet (7.5 mg total) by mouth daily. ?  ?omeprazole 40 MG capsule ?Commonly known as:  PRILOSEC ?Take 1 capsule (40 mg total) by mouth daily. ?  ?polyethylene glycol powder 17 GM/SCOOP powder ?Commonly known as: GLYCOLAX/MIRALAX ?Take 17 g by mouth daily. ?  ?rosuvastatin 20 MG tablet ?Commonly known as: CRESTOR ?Take 1 tablet (20 mg total) by mouth daily. ?Start taking on: April 30, 2021 ?  ?tamsulosin 0.4 MG Caps capsule ?Commonly known as: FLOMAX ?Take 0.4 mg by mouth daily. ?  ?vitamin B-12 1000 MCG tablet ?Commonly known as: CYANOCOBALAMIN ?Take 1 tablet (1,000 mcg total) by mouth daily. ?  ? ?  ? ? ?DISPOSITION AND FOLLOW-UP:  ?Mr.Zale Marcotte Zane was discharged from [facility] in Stable condition. At the hospital follow up visit please address: ? ?Acute L frontoparietal CVA ?Continue to manage risk factors. ?  ?Polycythemia, chronic  ?Elevated alkaline phosphatase ?Ensure patient establishes follow-up with oncology for further phlebotomy needs. ?  ?Hyperlipidemia ?Recheck lipid panel in 6-8 weeks. ? ?Follow-up Recommendations: ?Consults: Neurology, Interventional Radiology, Oncology ?Labs: CBC, lipid panel ?Studies: F/u SPEP, EPO levels. ?Medications:  ? ?START taking: ?Plavix 75 mg daily for 3 weeks ?Aspirin 81 mg daily ?Rosuvastatin 20 mg daily ?STOP taking: ?Atorvastatin 10 mg daily ? ?Follow-up Appointments: ? Follow-up Information   ? ? [facility]. Schedule an appointment as soon as possible for a visit in 1 month(s).   ?Specialty: Neurology ?Why: stroke clinic ?Contact information: ?[address] [place] ?[phone] ? ?  ?  ? ?  Curt Bears, MD. Schedule an appointment as soon as possible for a visit.   ?Specialty: Oncology ?Why: When you discharge from inpatient rehab. ?Contact information: ?[address] ?[place] [id number] ?[phone] ? ? ?  ?  ? ?  ?  ? ?  ? ? ?HOSPITAL COURSE:  ?Patient Summary: ?Acute L frontoparietal CVA ?Patient presented with five days of waxing and waning right sided  weakness without any other deficits. He was noted to have an acute left frontoparietal infarct with mild associated edema without mass affect with findings consistent with chronic microvascular disease. CTA Head/Neck negative for large vessel occlusion but did note moderate stenosis of distal L intradural vertebral artery with 4x92m aneurysm, low risk for rupture at this time. Echo with Bubble study obtained to evaluate for shunting without evidence of interarterial shunting or valvular disease. Neurology consulted and recommended risk factor work-up and started Plavix 75 mg daily and aspirin 81 mg daily after loading dose of 325 mg. Over the course of admission his RUE decreased in motor function to the point of no motor ability; RLE remained steady at 3/5 strength. Sensation remained equal and intact in all extremities. Mental status was intact throughout admission. Patient has been recommended for CIR per therapy teams. ?  ?Polycythemia, chronic  ?Elevated alkaline phosphatase ?Patient noted to have elevated hemoglobin on admission. On MR Brain he was noted to have diffuse T1 hypointensity of bone marrow with CTA head with chronic mixed lytic and sclerotic changes throughout skeleton, similar to prior PET scan findings; suggestive of myelofibrosis or metabolic bone disorder. Alkaline phosphatase was elevated but serum calcium was normal. He underwent therapeutic phlebotomy 03/20 with appropriate decrease in hemoglobin and hematocrit with 500 cc removed. Renal UKoreashowed no hydronephrosis but was positive for prostatomegaly. Blood smear did not show blasts. EPO and SPEP pending at time of discharge.  ?  ?Hypertension ?Restarted home amlodipine 5 mg daily. ?  ?Hyperlipidemia ?Lipid panel showed LDL 120. Cholesterol medication regimen was changed to rosuvastatin 20 mg daily.  ? ?DISCHARGE INSTRUCTIONS:  ? ?Discharge Instructions   ? ? Ambulatory referral to Neurology   Complete by: As directed ?  ? Follow up with  stroke clinic NP (Jessica VEast [facility] CCecille Rubin if both not available, [facility]achery Dauer [facility].  ? Diet - low sodium heart healthy   Complete by: As directed ?  ? Discharge instructions   Complete by: As directed ?  ? Mr. HKniss ? ?It was a [facility] to care [facility]. I am glad that we are able to get you into inpatient rehabilitation to work on getting your strength back! ? ?When you go home, you will have three medication changes: ?1. Start Plavix 75 mg daily for three weeks. ?2. Start aspirin 81 mg daily. ?3. Start rosuvastatin 20 mg daily. ? ?You will need to follow up with oncology, neurology at the stroke clinic, and interventional radiology after you are discharged from inpatient rehabilitation. Since we do not know exactly how long you will be there, we have not scheduled those appointments for you yet. ? ?I wish you the best, ?Dr. DMarlou Sa ? Increase activity slowly   Complete by: As directed ?  ? ?  ? ? ?SUBJECTIVE:  ?Patient seen at bedside on day of discharge. He states that he is feeling more at peace with his situation  and looks forward to working hard to regain he strength and movement. He again endorses R ankle pain and explains that he has gotten steroid injections for pain in the past which helped the pain. Overall doing well without immediate concerns. ? ?Discharge Vitals:   ?BP 108/73 (BP Location: Right Arm)   Pulse 83   Temp 98.6 ?F (37 ?C) (Oral)   Resp 20   Ht '5\' 10"'$  (1.778 m)   Wt 83.9 kg   SpO2 97%   BMI 26.54 kg/m?  ? ?OBJECTIVE:  ?Constitutional:Pleasant gentleman resting comfortably in bed in no acute distress. ?Cardio:Regular rate and rhythm. No murmurs, rubs, gallops. ?Pulm:Normal work of breathing on room air. ?Abdomen:Soft, nontender, nondistended. ?GDJ:MEQAST bulk and tone. ?Skin:Warm and dry. ?Neuro: ?Mental Status: ?Patient is awake, alert,  oriented to self, location, time. ?No signs of aphasia or neglect ?Cranial Nerves: ?III,IV, VI: EOMI without ptosis or diploplia.  ?V: Facial sensation is symmetric to light touch and temperature. ?VII: Facial moveme

## 2021-04-29 NOTE — Progress Notes (Signed)
Physical Therapy Treatment ?Patient Details ?Name: Aaron Dennis ?MRN: [id number] ?DOB: December 09, 1955 ?Today's Date: 04/29/2021 ? ? ?History of Present Illness Aaron Dennis is a 66 y.o. male presents to [facility] with right sided weakness. BTD:VVOHY infarct in the left frontoparietal [place] matter. Mild associated edema without mass effect. PMHx: hypertension, reflux disease, polycythemia and bilateral knee osteoarthritis. ? ?  ?PT Comments  ? ? Pt admitted with above diagnosis. Pt was able to ambulate with mod assist of 2 with left UE support, assist for weight shifting as well as assist for postural stability. Pt was able to incr distance today and tolerated session well.  Plan is to go to Rehab.  Pt currently with functional limitations due to balance and endurance deficits. Pt will benefit from skilled PT to increase their independence and safety with mobility to allow discharge to the venue listed below.      ?Recommendations for follow up therapy are one component of a multi-disciplinary discharge planning process, led by the attending physician.  Recommendations may be updated based on patient status, additional functional criteria and insurance authorization. ? ?Follow Up Recommendations ? Acute inpatient rehab (3hours/day) ?  ?  ?Assistance Recommended at Discharge Set up Supervision/Assistance  ?Patient can return home with the following Two people to help with walking and/or transfers;A lot of help with bathing/dressing/bathroom;Help with stairs or ramp for entrance ?  ?Equipment Recommendations ? Wheelchair (measurements PT);Wheelchair cushion (measurements PT);BSC/3in1 (possible hemiwalker)  ?  ?Recommendations for Other Services Rehab consult ? ? ?  ?Precautions / Restrictions Precautions ?Precautions: Fall ?Restrictions ?Weight Bearing Restrictions: No  ?  ? ?Mobility ? Bed Mobility ?Overal bed mobility: Needs Assistance ?Bed Mobility: Rolling, Sidelying to Sit, Sit to Sidelying ?Rolling: Min assist (to  right) ?Sidelying to sit: Mod assist ?  ?  ?Sit to sidelying: Mod assist ?General bed mobility comments: VCs for sequencing as well as assist ?  ? ?Transfers ?Overall transfer level: Needs assistance ?Equipment used: None ?Transfers: Sit to/from Stand ?Sit to Stand: Mod assist, +2 safety/equipment, From elevated surface ?  ?  ?  ?  ?  ?General transfer comment: Pt stood from bed with +2 mod assist with assist blocking right knee, HHA on left for support and use of gait belt. Worked on weight shifting and stepping in standing. Also cued for posture as pt tends to lean forward. ?  ? ?Ambulation/Gait ?Ambulation/Gait assistance: Mod assist, +2 physical assistance ?Gait Distance (Feet): 27 Feet (12 feet then 15 feet) ?Assistive device:  (Hand on back of chair positioned to pts left) ?Gait Pattern/deviations: Step-to pattern, Decreased step length - right, Decreased stance time - right, Decreased dorsiflexion - right, Decreased weight shift to right, Knee flexed in stance - right, Knees buckling, Trunk flexed ?  ?Gait velocity interpretation: <1.31 ft/sec, indicative of household ambulator ?  ?General Gait Details: Pt was able to ambulate forward with hand on left on chair back for support, cues for sequencing steps, at times needed assist to move right LE and at times pt did not, pt needed assist to shift weight.  Also needed cues to look up and keep shoulders back as pt tended to flex trunk and head as he fatigues.  Weakness noted in hip extensors as well as knee extensors which causes decr postural stability at times after pt steps needing incr assist. Will try hemiwalker next visit. ? ? ?Stairs ?  ?  ?  ?  ?  ? ? ?Wheelchair Mobility ?  ? ?Modified Rankin (  Stroke Patients Only) ?Modified Rankin (Stroke Patients Only) ?Pre-Morbid Rankin Score: No symptoms ?Modified Rankin: Severe disability ? ? ?  ?Balance Overall balance assessment: Needs assistance ?Sitting-balance support: Feet supported, Single extremity  supported ?Sitting balance-Leahy Scale: Poor ?Sitting balance - Comments: intermittent use of LUE while seated EOB (statically), he could move hand off of bed onto lap with min guard A.  right UE flaccid.  Sitting balance overall min guard assist ?  ?Standing balance support: Single extremity supported, During functional activity ?Standing balance-Leahy Scale: Poor ?Standing balance comment: Mod A sit<>stand and maintain static standing.  Dynamic mod assist of 2 ?  ?  ?  ?  ?  ?  ?  ?  ?  ?  ?  ?  ? ?  ?Cognition Arousal/Alertness: Awake/alert ?Behavior During Therapy: [facility] for tasks assessed/performed ?Overall Cognitive Status: Within Functional Limits for tasks assessed ?  ?  ?  ?  ?  ?  ?  ?  ?  ?  ?  ?  ?  ?  ?  ?  ?  ?  ?  ? ?  ?Exercises Other Exercises ?Other Exercises: Educated son in room on AAROM for RUE and RLE, pillow under right UE ? ?  ?General Comments   ?  ?  ? ?Pertinent Vitals/Pain Pain Assessment ?Pain Assessment: No/denies pain  ? ? ?Home Living   ?  ?  ?  ?  ?  ?  ?  ?  ?  ?   ?  ?Prior Function    ?  ?  ?   ? ?PT Goals (current goals can now be found in the care plan section) Acute Rehab PT Goals ?Patient Stated Goal: to go home ?Progress towards PT goals: Progressing toward goals ? ?  ?Frequency ? ? ? Min 4X/week ? ? ? ?  ?PT Plan Current plan remains appropriate  ? ? ?Co-evaluation   ?  ?  ?  ?  ? ?  ?AM-PAC PT "6 Clicks" Mobility   ?Outcome Measure ? Help needed turning from your back to your side while in a flat bed without using bedrails?: A Little ?Help needed moving from lying on your back to sitting on the side of a flat bed without using bedrails?: A Lot ?Help needed moving to and from a bed to a chair (including a wheelchair)?: Total ?Help needed standing up from a chair using your arms (e.g., wheelchair or bedside chair)?: Total ?Help needed to walk in hospital room?: Total ?Help needed climbing 3-5 steps with a railing? : Total ?6 Click Score: 9 ? ?  ?End of Session Equipment  Utilized During Treatment: Gait belt ?Activity Tolerance: Patient limited by fatigue ?Patient left: in chair;with call bell/phone within reach;with chair alarm set;with family/visitor present ?Nurse Communication: Mobility status ?PT Visit Diagnosis: Unsteadiness on feet (R26.81);Muscle weakness (generalized) (M62.81) ?  ? ? ?Time: 7408-1448 ?PT Time Calculation (min) (ACUTE ONLY): 29 min ? ?Charges:  $Gait Training: 23-37 mins          ?          ? ?Leslieann Whisman M,PT ?Acute Rehab Services ?[phone] ?[phone] (pager)  ? ? ?Alvira Philips ?04/29/2021, 2:27 PM ? ?

## 2021-04-29 NOTE — Hospital Course (Signed)
Acute L frontoparietal CVA ?Patient presented with five days of waxing and waning right sided weakness without any other deficits. He was noted to have an acute left frontoparietal infarct with mild associated edema without mass affect with findings consistent with chronic microvascular disease. CTA Head/Neck negative for large vessel occlusion but did note moderate stenosis of distal L intradural vertebral artery with 4x23m aneurysm, low risk for rupture at this time. Echo with Bubble study obtained to evaluate for shunting without evidence of interarterial shunting or valvular disease. Neurology consulted and recommended risk factor work-up and started Plavix 75 mg daily and aspirin 81 mg daily after loading dose of 325 mg. Over the course of admission his RUE decreased in motor function to the point of no motor ability; RLE remained steady at 3/5 strength. Sensation remained equal and intact in all extremities. Mental status was intact throughout admission. Patient has been recommended for CIR per therapy teams. ?  ?Polycythemia, chronic  ?Elevated alkaline phosphatase ?Patient noted to have elevated hemoglobin on admission. On MR Brain he was noted to have diffuse T1 hypointensity of bone marrow with CTA head with chronic mixed lytic and sclerotic changes throughout skeleton, similar to prior PET scan findings; suggestive of myelofibrosis or metabolic bone disorder. Alkaline phosphatase was elevated but serum calcium was normal. He underwent therapeutic phlebotomy 03/20 with appropriate decrease in hemoglobin and hematocrit with 500 cc removed. Renal UKoreashowed no hydronephrosis but was positive for prostatomegaly. Blood smear did not show blasts. EPO and SPEP pending at time of discharge.  ?  ?Hypertension ?Restarted home amlodipine 5 mg daily. ?  ?Hyperlipidemia ?Lipid panel showed LDL 120. Cholesterol medication regimen was changed to rosuvastatin 20 mg daily. ?

## 2021-04-29 NOTE — Progress Notes (Signed)
Inpatient Rehab Admissions Coordinator:  ? ?I have insurance auth and a CIR bed for this Pt. Today. RN may call report to [phone]. ? ?Clemens Catholic, MS, CCC-SLP ?Rehab Admissions Coordinator  ?[phone] (celll) ?[phone] (office) ? ?

## 2021-04-29 NOTE — Progress Notes (Addendum)
Inpatient Rehabilitation Admission Medication Review by a Pharmacist ? ?A complete drug regimen review was completed for this patient to identify any potential clinically significant medication issues. ? ?High Risk Drug Classes Is patient taking? Indication by Medication  ?Antipsychotic Yes PRN compazine - N/V  ?Anticoagulant Yes Lovenox for DVT px  ?Antibiotic No   ?Opioid No   ?Antiplatelet Yes ASA/Plavix - CVA  ?Hypoglycemics/insulin No   ?Vasoactive Medication Yes Norvasc - HTN  ?Chemotherapy No   ?Other Yes Trazodone - sleep ?Crestor - HLD ?Flomax - BPH ?Protonix - GERD  ? ? ? ?Type of Medication Issue Identified Description of Issue Recommendation(s)  ?Drug Interaction(s) (clinically significant) ?    ?Duplicate Therapy ?    ?Allergy ?    ?No Medication Administration End Date ?    ?Incorrect Dose ?    ?Additional Drug Therapy Needed ? Baclofen, proscar, hydrocortisone-pramoxine 2.5-1 %, Vit D, meclizine, meloxicam, B12 Continue to hold per Risa Grill.  ?Significant med changes from prior encounter (inform family/care partners about these prior to discharge).    ?Other ?    ? ? ?Clinically significant medication issues were identified that warrant physician communication and completion of prescribed/recommended actions by midnight of the next day:  Yes>resolved ? ?Name of provider notified for urgent issues identified: Risa Grill, PA ? ?Provider Method of Notification: Secure chat ? ? ? ?Pharmacist comments:  ? ?Time spent performing this drug regimen review (minutes):  20 ? ? ? ?

## 2021-04-29 NOTE — Progress Notes (Signed)
Inpatient Rehab Admissions Coordinator:  ? ?I spoke with Pt.'s son regarding potential CIR admit. They are interested in CIR and family can provide 24/7 assist at discharge I opened a case with his insurance yesterday and will pursue for admission pending insurance auth.  ? ?Clemens Catholic, MS, CCC-SLP ?Rehab Admissions Coordinator  ?[phone] (celll) ?[phone] (office) ? ?

## 2021-04-29 NOTE — PMR Pre-admission (Signed)
PMR Admission Coordinator Pre-Admission Assessment ? ?Patient: Aaron Dennis is an 66 y.o., male ?MRN: [id number] ?DOB: August 03, 1955 ?Height: 5' 10" (177.8 cm) ?Weight: 83.9 kg ? ?Insurance Information ?HMO:     PPO:      PCP:      IPA:      80/20:      OTHER:  ?PRIMARY: UHC Medicare       Policy#: [id number]      Subscriber: Pt  ?CM Name:       Phone#: [phone]     Fax#: [phone] I spoke with Lenna Sciara at [facility] and received insurance auth for CIR admit 3/21-3/27 on 3/21 with updates due on the 27th ?Pre-Cert#: [id number]      Employer: n/a ?Eff Date: 02/09/2021 - still active ?Deductible: does not have one ?OOP Max: $8,300 ($0 met) ?CIR: $1,556/ admission copay ?SNF: $0.00 Copayment per day for days 1-20; $200 Copayment per day for days 21-100; Maximum of 100 days/benefit period ?Outpatient: 80% coverage; 20% co-insurance ?Home Health:  100% coverage, limited by medical necessity ?DME: 80% coverage; 20% co-insurance ?Providers: in networkBenefits: Aaron Dennis, Aaron Dennis ? ?Insurance: UHC MCR ?Plan Type: HMOPOS-[employer] Dual Complete (HMO-POS D-S, [id number] ? ?HMO: YES         ?Policy #: [id number] ?Fax: [phone] ?Phone: Online - [email or web address] ?Eff Date: 02/09/2021 - still active ?Deductible: does not have one ?OOP Max: $8,300 ($0 met) ? ?  ? ?CIR: $1,556/ admission copay ? ?SNF: $0.00 Copayment per day for days 1-20; $200 Copayment per day for days 21-100; Maximum of 100 days/benefit period ? ?Outpatient: 80% coverage; 20% co-insurance ? ?Home Health:  100% coverage, limited by medical necessity ? ?DME: 80% coverage; 20% co-insurance ? ?SECONDARY:   Medicaid [place] Access   Policy#:    [id number]  Phone#:  ? ?Financial Counselor:       Phone#:  ? ?The ?Data Collection Information Summary? for patients in Inpatient Rehabilitation Facilities with attached ?Privacy Act [place] Records? was provided and verbally reviewed with: Family ? ?Emergency Contact Information ?Contact  Information   ? ? Name Relation Home Work Mobile  ? Aaron Dennis, Aaron Dennis [id number]    ? ?  ? ? ?Current Medical History  ?Patient Admitting Diagnosis: CVA ?History of Present Illness: Aaron Dennis is a 66 y.o. male with a pertinent PMH of hypertension, reflux disease, polycythemia and bilateral knee osteoarthritis, who presented to [facility] ED 04/28/21  with right sided weakness. Patient reported five days of waxing and waning right sided weakness. Patient first noted the weakness when he was in [place] and that he had symptoms off and on during his flights returning to the [place].  In the ED, the patient was concerning for stroke for which CT Head wo Contrast and MRI Brain obtained. CT Head negative but MRI did show an acute left frontoparietal stroke. Due to recent travel and concern for paradoxical stroke, DVT US was obtained - negative for DVT. Neurology consulted and patient admitted for further stroke work up . During admission, Pt also noted to have his polycythemia, with hemoglobin 18.1->18.7.  In the past, JAK2 mutation negative.  Erythropoietin negative.  He follows with Dr. Julien Nordmann at [facility] long oncology, status post phlebotomies. Pt. Had therapeutic phlebotomy 04/28/21. CTA also showed 4x3 cerebral aneurysm. Stroke team discussed with Dr. Estanislado Pandy  with interventional radiology,  and recommendation was made to follow-up as outpatient Pt. Was seen by PT/OT/SLP who recommended CIR to assist return to PLOF.  ? ?Complete NIHSS TOTAL: 7 ? ?  Patient's medical record from [facility]  has been reviewed by the rehabilitation admission coordinator and physician. ? ?Past Medical History  ?Past Medical History:  ?Diagnosis Date  ? GERD (gastroesophageal reflux disease)   ? Hypertension   ? Lung nodule   ? 7 mm LUL nodule  ? ? ?Has the patient had major surgery during 100 days prior to admission? No ? ?Family History   ?family history includes Diabetes in his brother; Healthy in his mother. ? ?Current  Medications ? ?Current Facility-Administered Medications:  ?  acetaminophen (TYLENOL) tablet 650 mg, 650 mg, Oral, Q4H PRN, 650 mg at 04/27/21 2309 **OR** acetaminophen (TYLENOL) 160 MG/5ML solution 650 mg, 650 mg, Per Tube, Q4H PRN **OR** acetaminophen (TYLENOL) suppository 650 mg, 650 mg, Rectal, Q4H PRN, Aaron Dennis, Sadia, MD ?  amLODipine (NORVASC) tablet 5 mg, 5 mg, Oral, Daily, Aaron Dennis, Sadia, MD, 5 mg at 04/29/21 0931 ?  [COMPLETED] aspirin tablet 325 mg, 325 mg, Oral, Once, 325 mg at 04/27/21 1716 **FOLLOWED BY** aspirin EC tablet 81 mg, 81 mg, Oral, Daily, Aaron Dennis, Sadia, MD, 81 mg at 04/29/21 0931 ?  clopidogrel (PLAVIX) tablet 75 mg, 75 mg, Oral, Daily, Aaron Dennis, Sadia, MD, 75 mg at 04/29/21 0931 ?  enoxaparin (LOVENOX) injection 40 mg, 40 mg, Subcutaneous, Q24H, Aaron Dennis, Sadia, MD, 40 mg at 04/28/21 1826 ?  pantoprazole (PROTONIX) EC tablet 40 mg, 40 mg, Oral, Daily, Aaron Dennis, Sadia, MD, 40 mg at 04/29/21 0931 ?  rosuvastatin (CRESTOR) tablet 20 mg, 20 mg, Oral, Daily, Farrel Gordon, DO, 20 mg at 04/29/21 0932 ?  tamsulosin (FLOMAX) capsule 0.4 mg, 0.4 mg, Oral, Daily, Aaron Dennis, Sadia, MD, 0.4 mg at 04/29/21 0932 ? ?Patients Current Diet:  ?Diet Order   ? ?       ?  Diet Heart Room service appropriate? Yes; Fluid consistency: Thin  Diet effective now       ?  ? ?  ?  ? ?  ? ? ?Precautions / Restrictions ?Precautions ?Precautions: Fall ?Restrictions ?Weight Bearing Restrictions: No  ? ?Has the patient had 2 or more falls or a fall with injury in the past year? No ? ?Prior Activity Level ?Community (5-7x/wk): Pt activein the community PTA ? ?Prior Functional Level ?Self Care: Did the patient need help bathing, dressing, using the toilet or eating? Independent ? ?Indoor Mobility: Did the patient need assistance with walking from room to room (with or without device)? Independent ? ?Stairs: Did the patient need assistance with internal or external stairs (with or without device)? Independent ? ?Functional Cognition: Did the  patient need help planning regular tasks such as shopping or remembering to take medications? Independent ? ?Patient Information ?Are you of Hispanic, Latino/a,or Spanish origin?: A. No, not of Hispanic, Latino/a, or Spanish origin ?What is your race?: B. Black or African American ?Do you need or want an interpreter to communicate with a doctor or health care staff?: 0. No ? ?Patient's Response To:  ?Health Literacy and Transportation ?Is the patient able to respond to health literacy and transportation needs?: Yes ?Health Literacy - How often do you need to have someone help you when you read instructions, pamphlets, or other written material from your doctor or pharmacy?: Sometimes ?In the past 12 months, has lack of transportation kept you from medical appointments or from getting medications?: No ?In the past 12 months, has lack of transportation kept you from meetings, work, or from getting things needed for daily living?: No ? ?Home Assistive Devices / Equipment ?  Home Assistive Devices/Equipment: None ?Home Equipment: Kasandra Knudsen - single point, Shower seat ? ?Prior Device Use: Indicate devices/aids used by the patient prior to current illness, exacerbation or injury? None of the above ? ?Current Functional Level ?Cognition ? Overall Cognitive Status: Within Functional Limits for tasks assessed ?Orientation Level: Oriented X4 ?Attention: Focused, Sustained ?Focused Attention: Appears intact ?Sustained Attention: Appears intact ?Memory:  (Unable to assess) ?Problem Solving: Appears intact ?Executive Function: Reasoning ?Reasoning: Appears intact ?   ?Extremity Assessment ?(includes Sensation/Coordination) ? Upper Extremity Assessment: Defer to OT evaluation ?RUE Deficits / Details: 0/5 for all except shoulder extension, shoulder abduction, and shoulder internal rotation (all 1/5). Sensation intact, PROM WNL all joints, no subluxation ?RUE Coordination: decreased gross motor, decreased fine motor  ?Lower Extremity  Assessment: LLE deficits/detail, RLE deficits/detail ?RLE Deficits / Details: grossly 2/5 ?RLE Sensation: decreased light touch ?RLE Coordination: decreased gross motor  ?  ?ADLs ? Overall ADL's : Needs assistance/impaired ?Bo Mcclintock

## 2021-04-29 NOTE — H&P (Signed)
?  ?Physical Medicine and Rehabilitation Admission H&P ?  ?  ?   ?Chief Complaint  ?Patient presents with  ? Weakness  ?CC :  ?HPI: Aaron Dennis is a 66 year old male who presented to the emergency department at [facility] on 04/27/2021 with complaints of right-sided weakness he stated had been ongoing since Thursday night.  Symptoms were acute in onset and he had no mental status changes.  He was in route from [place] to the [place] at the time of the symptoms.  Imaging work-up included head CT which was negative, MRI of the brain and lower extremity venous ultrasound.  The patient was seen in consultation by Dr. Leonel Ramsay.  MRI revealed an acute infarction in the left frontoparietal lobe with associated edema without mass effect.  She was well outside the window for thrombolytic therapy.  Venous ultrasound was negative for lower extremity DVT.  He was started on aspirin and Plavix therapy he will continue Plavix for 3 weeks then aspirin alone.  His Lipitor was increased from 10 mg to Crestor 20 mg.  Also noted on imaging was a 4 x 3 cerebral aneurysm and Dr. [name] discussed the findings with Dr. Estanislado Pandy.  Recommendations were to follow-up with interventional radiology as an outpatient.  CTA of head and neck showed multifocal intracranial stenosis including the above-mentioned aneurysm.  Echocardiography revealed ejection fraction of 60 to 65%.  The patient remained hemodynamically stable. Significant left-sided weakness.  No dysarthria or dysphagia.  Heart healthy diet.  The patient requires inpatient medicine and rehabilitation evaluations and services for ongoing dysfunction secondary to left frontoparietal acute infarct. ?  ?LDL 120 ?A1c 4.9 ?Urine drug screen negative ?Serum creatinine 0.80 ?  ?Past medical history significant for polycythemia and coronary artery disease. ?  ?Review of Systems  ?Constitutional:  Positive for malaise/fatigue. Negative for chills and fever.  ?HENT:  Negative.    ?Eyes: Negative.   ?Respiratory: Negative.    ?Cardiovascular: Negative.   ?Gastrointestinal:  Negative for abdominal pain, constipation and heartburn.  ?Genitourinary:  Negative for dysuria and frequency.  ?Skin: Negative.   ?Neurological:  Positive for sensory change and focal weakness.  ?Endo/Heme/Allergies: Negative.   ?All other systems reviewed and are negative. ?    ?Past Medical History:  ?Diagnosis Date  ? GERD (gastroesophageal reflux disease)    ? Hypertension    ? Lung nodule    ?  7 mm LUL nodule  ?  ?History reviewed. No pertinent surgical history. ?     ?Family History  ?Problem Relation Age of Onset  ? Healthy Mother    ? Diabetes Brother    ? Colon polyps Neg Hx    ? Colon cancer Neg Hx    ? Esophageal cancer Neg Hx    ? Rectal cancer Neg Hx    ? Stomach cancer Neg Hx    ?  ?Social History:  reports that he has quit smoking. He has never used smokeless tobacco. He reports that he does not drink alcohol and does not use drugs. ?Allergies: No Known Allergies ?      ?Medications Prior to Admission  ?Medication Sig Dispense Refill  ? acetaminophen (TYLENOL) 500 MG tablet Take 1,000 mg by mouth every 6 (six) hours as needed for mild pain or headache.      ? amLODipine (NORVASC) 5 MG tablet Take 1 tablet (5 mg total) by mouth daily. 90 tablet 1  ? finasteride (PROSCAR) 5 MG tablet Take 5 mg by  mouth daily.      ? meloxicam (MOBIC) 7.5 MG tablet Take 1 tablet (7.5 mg total) by mouth daily. 90 tablet 1  ? omeprazole (PRILOSEC) 40 MG capsule Take 1 capsule (40 mg total) by mouth daily. 30 capsule 2  ? tamsulosin (FLOMAX) 0.4 MG CAPS capsule Take 0.4 mg by mouth daily.      ? atorvastatin (LIPITOR) 10 MG tablet Take 1 tablet (10 mg total) by mouth daily. (Patient not taking: Reported on 04/27/2021) 90 tablet 1  ? baclofen (LIORESAL) 10 MG tablet Take 0.5-1 tablets (5-10 mg total) by mouth at bedtime as needed for muscle spasms. (Patient not taking: Reported on 10/31/2020) 90 tablet 1  ?  ergocalciferol (DRISDOL) 1.25 MG (50000 UT) capsule Take 1 capsule (50,000 Units total) by mouth once a week. (Patient not taking: Reported on 04/27/2021) 12 capsule 1  ? hydrocortisone-pramoxine (ANALPRAM HC) 2.5-1 % rectal cream Place 1 application rectally 3 (three) times daily. (Patient not taking: Reported on 04/27/2021) 30 g 1  ? meclizine (ANTIVERT) 25 MG tablet Take 1 tablet (25 mg total) by mouth 2 (two) times daily as needed for dizziness. (Patient not taking: Reported on 10/31/2020) 60 tablet 1  ? polyethylene glycol powder (GLYCOLAX/MIRALAX) 17 GM/SCOOP powder Take 17 g by mouth daily. (Patient not taking: Reported on 04/27/2021) 3350 g 1  ? vitamin B-12 (CYANOCOBALAMIN) 1000 MCG tablet Take 1 tablet (1,000 mcg total) by mouth daily. (Patient not taking: Reported on 04/27/2021) 90 tablet 1  ?  ?  ?  ?  ?Home: ?Home Living ?Family/patient expects to be discharged to:: Private residence ?Living Arrangements: Spouse/significant other, Children ?Available Help at Discharge: Family, Available 24 hours/day ?Type of Home: House ?Home Access: Stairs to enter ?Entrance Stairs-Number of Steps: 3 ?Entrance Stairs-Rails: Left (as you ascend) ?Home Layout: One level ?Bathroom Shower/Tub: Tub/shower unit, Curtain ?Bathroom Toilet: Standard ?Home Equipment: Kasandra Knudsen - single point, Shower seat ?Additional Comments: RW-4 wheels, no seat. Just returned from [place] 4 days ago ? Lives With: Spouse, Son ?  ?Functional History: ?Prior Function ?Prior Level of Function : Independent/Modified Independent ?  ?Functional Status:  ?Mobility: ?Bed Mobility ?Overal bed mobility: Needs Assistance ?Bed Mobility: Rolling, Sidelying to Sit, Sit to Sidelying ?Rolling: Min assist (to right) ?Sidelying to sit: Mod assist ?Sit to sidelying: Mod assist ?General bed mobility comments: VCs for sequencing as well as assist ?Transfers ?Overall transfer level: Needs assistance ?Equipment used: 1 person hand held assist ?Transfers: Sit to/from Stand ?Sit  to Stand: Mod assist, +2 safety/equipment, From elevated surface ?General transfer comment: Pt stood from bed with +2 mod assist with assist blocking right knee, HHA on left for support and use of gait belt. Worked on weight shifting and stepping in standing. Also cued for posture as pt tends to lean forward. ?Ambulation/Gait ?Ambulation/Gait assistance: Mod assist, +2 physical assistance ?Gait Distance (Feet): 3 Feet ?Assistive device: 1 person hand held assist (Hand on back of chair positioned to pts left) ?Gait Pattern/deviations: Step-to pattern, Decreased step length - right, Decreased stance time - right, Decreased dorsiflexion - right, Decreased weight shift to right, Knee flexed in stance - right, Knees buckling, Trunk flexed ?General Gait Details: Pt was able to ambulate a few steps forward and back with HHA on left on chair back for support, cues for sequencing steps, at times needed assist to move right LE and at times pt did not, pt needed assist to shift weight.  Also needed cues to look up and keep shoulders back  as pt tended to flex trunk and head as he fatigues. ?Gait velocity interpretation: <1.31 ft/sec, indicative of household ambulator ?  ?ADL: ?ADL ?Overall ADL's : Needs assistance/impaired ?Eating/Feeding: Set up, Bed level ?Eating/Feeding Details (indicate cue type and reason): or supported sitting ?Grooming: Moderate assistance, Bed level ?Grooming Details (indicate cue type and reason): or supported sitting ?Upper Body Bathing: Moderate assistance, Bed level ?Upper Body Bathing Details (indicate cue type and reason): or supported sitting ?Lower Body Bathing: Moderate assistance, Sit to/from stand ?Lower Body Bathing Details (indicate cue type and reason): supported sitting ?Upper Body Dressing : Maximal assistance, Bed level ?Upper Body Dressing Details (indicate cue type and reason): or supported sitting ?Lower Body Dressing: Maximal assistance ?Lower Body Dressing Details (indicate cue  type and reason): Mod A sit<>stand, supported sitting ?Toilet Transfer: Moderate assistance ?Toileting- Clothing Manipulation and Hygiene: Total assistance ?Toileting - Clothing Manipulation Details (indicate cue type an

## 2021-04-29 NOTE — Progress Notes (Signed)
INPATIENT REHABILITATION ADMISSION NOTE ? ? ?Arrival Method: WC ?  ?   ?Mental Orientation: a&O X4 ? ? ?Assessment: done  ? ? ?Skin: intact ? ? ?IV'S: none ? ? ?Pain: none ? ? ?Tubes and Drains: none ? ? ?Safety Measures: reviewed with pt and son ? ? ?Vital Signs: done ? ? ?Height and Weight: done ? ? ?Rehab Orientation: done ? ? ?Family: at bedside ? ? ? ?Notes: Done. ?Gerald Stabs, RN ? ?

## 2021-04-29 NOTE — H&P (Signed)
Physical Medicine and Rehabilitation Admission H&P    Chief Complaint  Patient presents with   Weakness  CC :  HPI: Aaron Dennis is a 66 year old male who presented to the emergency department at [facility] on 04/27/2021 with complaints of right-sided weakness he stated had been ongoing since Thursday night.  Symptoms were acute in onset and he had no mental status changes.  He was in route from [place] to the [place] at the time of the symptoms.  Imaging work-up included head CT which was negative, MRI of the brain and lower extremity venous ultrasound.  The patient was seen in consultation by Dr. Amada Jupiter.  MRI revealed an acute infarction in the left frontoparietal lobe with associated edema without mass effect.  She was well outside the window for thrombolytic therapy.  Venous ultrasound was negative for lower extremity DVT.  He was started on aspirin and Plavix therapy he will continue Plavix for 3 weeks then aspirin alone.  His Lipitor was increased from 10 mg to Crestor 20 mg.  Also noted on imaging was a 4 x 3 cerebral aneurysm and Dr. Roda Shutters discussed the findings with Dr. Corliss Skains.  Recommendations were to follow-up with interventional radiology as an outpatient.  CTA of head and neck showed multifocal intracranial stenosis including the above-mentioned aneurysm.  Echocardiography revealed ejection fraction of 60 to 65%.  The patient remained hemodynamically stable. Significant left-sided weakness.  No dysarthria or dysphagia.  Heart healthy diet.  The patient requires inpatient medicine and rehabilitation evaluations and services for ongoing dysfunction secondary to left frontoparietal acute infarct.  LDL 120 A1c 4.9 Urine drug screen negative Serum creatinine 0.80  Past medical history significant for polycythemia and coronary artery disease.  Review of Systems  Constitutional:  Positive for malaise/fatigue. Negative for chills and fever.  HENT: Negative.     Eyes: Negative.   Respiratory: Negative.    Cardiovascular: Negative.   Gastrointestinal:  Negative for abdominal pain, constipation and heartburn.  Genitourinary:  Negative for dysuria and frequency.  Skin: Negative.   Neurological:  Positive for sensory change and focal weakness.  Endo/Heme/Allergies: Negative.   All other systems reviewed and are negative. Past Medical History:  Diagnosis Date   GERD (gastroesophageal reflux disease)    Hypertension    Lung nodule    7 mm LUL nodule   History reviewed. No pertinent surgical history. Family History  Problem Relation Age of Onset   Healthy Mother    Diabetes Brother    Colon polyps Neg Hx    Colon cancer Neg Hx    Esophageal cancer Neg Hx    Rectal cancer Neg Hx    Stomach cancer Neg Hx    Social History:  reports that he has quit smoking. He has never used smokeless tobacco. He reports that he does not drink alcohol and does not use drugs. Allergies: No Known Allergies Medications Prior to Admission  Medication Sig Dispense Refill   acetaminophen (TYLENOL) 500 MG tablet Take 1,000 mg by mouth every 6 (six) hours as needed for mild pain or headache.     amLODipine (NORVASC) 5 MG tablet Take 1 tablet (5 mg total) by mouth daily. 90 tablet 1   finasteride (PROSCAR) 5 MG tablet Take 5 mg by mouth daily.     meloxicam (MOBIC) 7.5 MG tablet Take 1 tablet (7.5 mg total) by mouth daily. 90 tablet 1   omeprazole (PRILOSEC) 40 MG capsule Take 1 capsule (40 mg total) by  mouth daily. 30 capsule 2   tamsulosin (FLOMAX) 0.4 MG CAPS capsule Take 0.4 mg by mouth daily.     atorvastatin (LIPITOR) 10 MG tablet Take 1 tablet (10 mg total) by mouth daily. (Patient not taking: Reported on 04/27/2021) 90 tablet 1   baclofen (LIORESAL) 10 MG tablet Take 0.5-1 tablets (5-10 mg total) by mouth at bedtime as needed for muscle spasms. (Patient not taking: Reported on 10/31/2020) 90 tablet 1   ergocalciferol (DRISDOL) 1.25 MG (50000 UT) capsule Take 1  capsule (50,000 Units total) by mouth once a week. (Patient not taking: Reported on 04/27/2021) 12 capsule 1   hydrocortisone-pramoxine (ANALPRAM HC) 2.5-1 % rectal cream Place 1 application rectally 3 (three) times daily. (Patient not taking: Reported on 04/27/2021) 30 g 1   meclizine (ANTIVERT) 25 MG tablet Take 1 tablet (25 mg total) by mouth 2 (two) times daily as needed for dizziness. (Patient not taking: Reported on 10/31/2020) 60 tablet 1   polyethylene glycol powder (GLYCOLAX/MIRALAX) 17 GM/SCOOP powder Take 17 g by mouth daily. (Patient not taking: Reported on 04/27/2021) 3350 g 1   vitamin B-12 (CYANOCOBALAMIN) 1000 MCG tablet Take 1 tablet (1,000 mcg total) by mouth daily. (Patient not taking: Reported on 04/27/2021) 90 tablet 1      Home: Home Living Family/patient expects to be discharged to:: Private residence Living Arrangements: Spouse/significant other, Children Available Help at Discharge: Family, Available 24 hours/day Type of Home: House Home Access: Stairs to enter [employer] of Steps: 3 Entrance Stairs-Rails: Left (as you ascend) Home Layout: One level Bathroom Shower/Tub: Tub/shower unit, Engineer, building services: Standard Home Equipment: Cane - single point, Shower seat Additional Comments: RW-4 wheels, no seat. Just returned from [place] 4 days ago  Lives With: Spouse, Son   Functional History: Prior Function Prior Level of Function : Independent/Modified Independent  Functional Status:  Mobility: Bed Mobility Overal bed mobility: Needs Assistance Bed Mobility: Rolling, Sidelying to Sit, Sit to Sidelying Rolling: Min assist (to right) Sidelying to sit: Mod assist Sit to sidelying: Mod assist General bed mobility comments: VCs for sequencing as well as assist Transfers Overall transfer level: Needs assistance Equipment used: 1 person hand held assist Transfers: Sit to/from Stand Sit to Stand: Mod assist, +2 safety/equipment, From elevated  surface General transfer comment: Pt stood from bed with +2 mod assist with assist blocking right knee, HHA on left for support and use of gait belt. Worked on weight shifting and stepping in standing. Also cued for posture as pt tends to lean forward. Ambulation/Gait Ambulation/Gait assistance: Mod assist, +2 physical assistance Gait Distance (Feet): 3 Feet Assistive device: 1 person hand held assist (Hand on back of chair positioned to pts left) Gait Pattern/deviations: Step-to pattern, Decreased step length - right, Decreased stance time - right, Decreased dorsiflexion - right, Decreased weight shift to right, Knee flexed in stance - right, Knees buckling, Trunk flexed General Gait Details: Pt was able to ambulate a few steps forward and back with HHA on left on chair back for support, cues for sequencing steps, at times needed assist to move right LE and at times pt did not, pt needed assist to shift weight.  Also needed cues to look up and keep shoulders back as pt tended to flex trunk and head as he fatigues. Gait velocity interpretation: <1.31 ft/sec, indicative of household ambulator    ADL: ADL Overall ADL's : Needs assistance/impaired Eating/Feeding: Set up, Bed level Eating/Feeding Details (indicate cue type and reason): or supported sitting Grooming:  Moderate assistance, Bed level Grooming Details (indicate cue type and reason): or supported sitting Upper Body Bathing: Moderate assistance, Bed level Upper Body Bathing Details (indicate cue type and reason): or supported sitting Lower Body Bathing: Moderate assistance, Sit to/from stand Lower Body Bathing Details (indicate cue type and reason): supported sitting Upper Body Dressing : Maximal assistance, Bed level Upper Body Dressing Details (indicate cue type and reason): or supported sitting Lower Body Dressing: Maximal assistance Lower Body Dressing Details (indicate cue type and reason): Mod A sit<>stand, supported  sitting Toilet Transfer: Moderate assistance Toileting- Clothing Manipulation and Hygiene: Total assistance Toileting - Clothing Manipulation Details (indicate cue type and reason): Mod A sit<>stand  Cognition: Cognition Overall Cognitive Status: Within Functional Limits for tasks assessed Orientation Level: Oriented X4 Attention: Focused, Sustained Focused Attention: Appears intact Sustained Attention: Appears intact Memory:  (Unable to assess) Problem Solving: Appears intact Executive Function: Reasoning Reasoning: Appears intact Cognition Arousal/Alertness: Awake/alert Behavior During Therapy: WFL for tasks assessed/performed Overall Cognitive Status: Within Functional Limits for tasks assessed  Physical Exam: Blood pressure 108/73, pulse 83, temperature 98.6 F (37 C), temperature source Oral, resp. rate 20, height 5\' 10"  (1.778 m), weight 83.9 kg, SpO2 97 %. Physical Exam Vitals and nursing note reviewed. Exam conducted with a chaperone present.  Constitutional:      Appearance: Normal appearance.     Comments: Pt sitting up eating pizza from outside- son at bedside; appears younger than stated age, NAD  HENT:     Head: Normocephalic and atraumatic.     Comments: Very mild R facial droop- tongue midline    Right Ear: External ear normal.     Left Ear: External ear normal.     Nose: Nose normal. No congestion.     Mouth/Throat:     Mouth: Mucous membranes are moist.     Pharynx: Oropharynx is clear. No oropharyngeal exudate.  Eyes:     General:        Right eye: No discharge.        Left eye: No discharge.     Extraocular Movements: Extraocular movements intact.  Cardiovascular:     Rate and Rhythm: Normal rate and regular rhythm.     Heart sounds: Normal heart sounds. No murmur heard.   No gallop.  Pulmonary:     Effort: Pulmonary effort is normal. No respiratory distress.     Breath sounds: Normal breath sounds. No wheezing, rhonchi or rales.  Abdominal:      General: Abdomen is flat. Bowel sounds are normal. There is no distension.     Palpations: Abdomen is soft.     Tenderness: There is no abdominal tenderness.  Musculoskeletal:     Cervical back: Neck supple. No tenderness.     Comments: LUE/LLE 5/5 RUE- 0/5 RLE- HF and KE/KF 2-/5; DF 2+/5; PF 2-/5   Skin:    General: Skin is warm and dry.     Comments: B/L forearm IV- L AC fossa IV looks  little red- but RUE looks OK   Neurological:     Mental Status: He is alert and oriented to person, place, and time.     Comments: Speech is at baseline per pt= per son Arabic, primary language is good Intact to light touch x 4 extremities  Psychiatric:        Mood and Affect: Mood normal.        Behavior: Behavior normal.    Results for orders placed or performed during the hospital encounter  of 04/27/21 (from the past 48 hour(s))  Hemoglobin A1c     Status: None   Collection Time: 04/27/21  4:49 PM  Result Value Ref Range   Hgb A1c MFr Bld 4.9 4.8 - 5.6 %    Comment: (NOTE) Pre diabetes:          5.7%-6.4%  Diabetes:              >6.4%  Glycemic control for   <7.0% adults with diabetes    Mean Plasma Glucose 93.93 mg/dL    Comment: Performed at [facility], [address] [place], [place] [address]  Lipid panel     Status: Abnormal   Collection Time: 04/27/21  4:49 PM  Result Value Ref Range   Cholesterol 159 0 - 200 mg/dL   Triglycerides 52 <696 mg/dL   HDL 29 (L) >29 mg/dL   Total CHOL/HDL Ratio 5.5 RATIO   VLDL 10 0 - 40 mg/dL   LDL Cholesterol 528 (H) 0 - 99 mg/dL    Comment:        Total Cholesterol/HDL:CHD Risk Coronary Heart Disease Risk Table                     Men   Women  1/2 Average Risk   3.4   3.3  Average Risk       5.0   4.4  2 X Average Risk   9.6   7.1  3 X Average Risk  23.4   11.0        Use the calculated Patient Ratio above and the CHD Risk Table to determine the patient's CHD Risk.        ATP III CLASSIFICATION (LDL):  <100     mg/dL    Optimal  413-244  mg/dL   Near or Above                    Optimal  130-159  mg/dL   Borderline  010-272  mg/dL   High  >536     mg/dL   Very High Performed at [facility], [address], [place], [place] [address]   Pathologist smear review     Status: None   Collection Time: 04/27/21  4:49 PM  Result Value Ref Range   Path Review NO BLASTS ARE IDENTIFIED     Comment: REVIEWED BY Napoleon Form, M.D. 04/28/2021 Performed at [facility], [address], [place], [place] [address]   Save Smear     Status: None   Collection Time: 04/27/21  4:49 PM  Result Value Ref Range   Smear Review SMEAR STAINED AND AVAILABLE FOR REVIEW     Comment: Performed at [facility], [address], [place], [place] [address]  Resp Panel by RT-PCR (Flu A&B, Covid) Nasopharyngeal Swab     Status: None   Collection Time: 04/27/21  6:29 PM   Specimen: Nasopharyngeal Swab; Nasopharyngeal(NP) swabs in vial transport medium  Result Value Ref Range   SARS Coronavirus 2 by RT PCR NEGATIVE NEGATIVE    Comment: (NOTE) SARS-CoV-2 target nucleic acids are NOT DETECTED.  The SARS-CoV-2 RNA is generally detectable in upper respiratory specimens during the acute phase of infection. The lowest concentration of SARS-CoV-2 viral copies this assay can detect is 138 copies/mL. A negative result does not preclude SARS-Cov-2 infection and should not be used as the sole basis for treatment or other patient management decisions.  A negative result may occur with  improper specimen collection/handling, submission of specimen other than nasopharyngeal swab, presence of viral mutation(s) within the areas targeted by this assay, and inadequate number of viral copies(<138 copies/mL). A negative result must be combined with clinical observations, patient history, and epidemiological information. The expected result is Negative.  Fact Sheet for Patients:  BloggerCourse.com  Fact  Sheet for Healthcare Providers:  SeriousBroker.it  This test is no t yet approved or cleared by the [place] FDA and  has been authorized for detection and/or diagnosis of SARS-CoV-2 by FDA under an Emergency Use Authorization (EUA). This EUA will remain  in effect (meaning this test can be used) for the duration of the COVID-19 declaration under Section 564(b)(1) of the Act, 21 U.S.C.section 360bbb-3(b)(1), unless the authorization is terminated  or revoked sooner.       Influenza A by PCR NEGATIVE NEGATIVE   Influenza B by PCR NEGATIVE NEGATIVE    Comment: (NOTE) The Xpert Xpress SARS-CoV-2/FLU/RSV plus assay is intended as an aid in the diagnosis of influenza from Nasopharyngeal swab specimens and should not be used as a sole basis for treatment. Nasal washings and aspirates are unacceptable for Xpert Xpress SARS-CoV-2/FLU/RSV testing.  Fact Sheet for Patients: BloggerCourse.com  Fact Sheet for Healthcare Providers: SeriousBroker.it  This test is not yet approved or cleared by the [place] FDA and has been authorized for detection and/or diagnosis of SARS-CoV-2 by FDA under an Emergency Use Authorization (EUA). This EUA will remain in effect (meaning this test can be used) for the duration of the COVID-19 declaration under Section 564(b)(1) of the Act, 21 U.S.C. section 360bbb-3(b)(1), unless the authorization is terminated or revoked.  Performed at [facility], [address] [place], [place] [address]   HIV Antibody (routine testing w rflx)     Status: None   Collection Time: 04/28/21 12:59 AM  Result Value Ref Range   HIV Screen 4th Generation wRfx Non Reactive Non Reactive    Comment: Performed at [facility], [address] [place], [place] [address]  CBC     Status: Abnormal   Collection Time: 04/28/21 12:59 AM  Result Value Ref Range   WBC 6.9 4.0 - 10.5 K/uL    RBC 6.33 (H) 4.22 - 5.81 MIL/uL   Hemoglobin 17.3 (H) 13.0 - 17.0 g/dL   HCT 09.8 (H) 11.9 - 14.7 %   MCV 82.9 80.0 - 100.0 fL   MCH 27.3 26.0 - 34.0 pg   MCHC 33.0 30.0 - 36.0 g/dL   RDW 82.9 56.2 - 13.0 %   Platelets 226 150 - 400 K/uL   nRBC 0.0 0.0 - 0.2 %    Comment: Performed at [facility], [address], [place], [place] [address]  Comprehensive metabolic panel     Status: Abnormal   Collection Time: 04/28/21 12:59 AM  Result Value Ref Range   Sodium 138 135 - 145 mmol/L   Potassium 3.2 (L) 3.5 - 5.1 mmol/L   Chloride 105 98 - 111 mmol/L   CO2 23 22 - 32 mmol/L   Glucose, Bld 82 70 - 99 mg/dL    Comment: Glucose reference range applies only to samples taken after fasting for at least 8 hours.   BUN 11 8 - 23 mg/dL   Creatinine, Ser 4.69 0.61 - 1.24 mg/dL   Calcium 8.8 (L) 8.9 - 10.3 mg/dL   Total Protein 6.8 6.5 - 8.1 g/dL   Albumin  3.6 3.5 - 5.0 g/dL   AST 18 15 - 41 U/L   ALT 11 0 - 44 U/L   Alkaline Phosphatase 112 38 - 126 U/L   Total Bilirubin 2.6 (H) 0.3 - 1.2 mg/dL   GFR, Estimated >16 >10 mL/min    Comment: (NOTE) Calculated using the CKD-EPI Creatinine Equation (2021)    Anion gap 10 5 - 15    Comment: Performed at [facility], [address], [place], [place] [address]  Urine rapid drug screen (hosp performed)     Status: None   Collection Time: 04/28/21  1:40 AM  Result Value Ref Range   Opiates NONE DETECTED NONE DETECTED   Cocaine NONE DETECTED NONE DETECTED   Benzodiazepines NONE DETECTED NONE DETECTED   Amphetamines NONE DETECTED NONE DETECTED   Tetrahydrocannabinol NONE DETECTED NONE DETECTED   Barbiturates NONE DETECTED NONE DETECTED    Comment: (NOTE) DRUG SCREEN FOR MEDICAL PURPOSES ONLY.  IF CONFIRMATION IS NEEDED FOR ANY PURPOSE, NOTIFY LAB WITHIN 5 DAYS.  LOWEST DETECTABLE LIMITS FOR URINE DRUG SCREEN Drug Class                     Cutoff (ng/mL) Amphetamine and metabolites    1000 Barbiturate and metabolites     200 Benzodiazepine                 200 Tricyclics and metabolites     300 Opiates and metabolites        300 Cocaine and metabolites        300 THC                            50 Performed at [facility], [address] [place], [place] [address]   CBC     Status: None   Collection Time: 04/29/21  4:09 AM  Result Value Ref Range   WBC 6.6 4.0 - 10.5 K/uL   RBC 5.65 4.22 - 5.81 MIL/uL   Hemoglobin 15.6 13.0 - 17.0 g/dL   HCT 19.1 47.8 - 29.5 %   MCV 81.9 80.0 - 100.0 fL   MCH 27.6 26.0 - 34.0 pg   MCHC 33.7 30.0 - 36.0 g/dL   RDW 62.1 30.8 - 65.7 %   Platelets 222 150 - 400 K/uL   nRBC 0.0 0.0 - 0.2 %    Comment: Performed at [facility], [address], [place], [place] [address]  Comprehensive metabolic panel     Status: Abnormal   Collection Time: 04/29/21  4:09 AM  Result Value Ref Range   Sodium 136 135 - 145 mmol/L   Potassium 3.7 3.5 - 5.1 mmol/L   Chloride 107 98 - 111 mmol/L   CO2 22 22 - 32 mmol/L   Glucose, Bld 91 70 - 99 mg/dL    Comment: Glucose reference range applies only to samples taken after fasting for at least 8 hours.   BUN 17 8 - 23 mg/dL   Creatinine, Ser 2.95 0.61 - 1.24 mg/dL   Calcium 8.5 (L) 8.9 - 10.3 mg/dL   Total Protein 5.8 (L) 6.5 - 8.1 g/dL   Albumin 3.1 (L) 3.5 - 5.0 g/dL   AST 16 15 - 41 U/L   ALT 11 0 - 44 U/L   Alkaline Phosphatase 87 38 - 126 U/L   Total Bilirubin 2.4 (H) 0.3 - 1.2 mg/dL   GFR, Estimated >  60 >60 mL/min    Comment: (NOTE) Calculated using the CKD-EPI Creatinine Equation (2021)    Anion gap 7 5 - 15    Comment: Performed at [facility], [address], [place], [place] [address]   CT ANGIO HEAD NECK W WO CM  Result Date: 04/27/2021 CLINICAL DATA:  Stroke/TIA, determine embolic source EXAM: CT ANGIOGRAPHY HEAD AND NECK TECHNIQUE: Multidetector CT imaging of the head and neck was performed using the standard protocol during bolus administration of intravenous contrast. Multiplanar CT image  reconstructions and MIPs were obtained to evaluate the vascular anatomy. Carotid stenosis measurements (when applicable) are obtained utilizing NASCET criteria, using the distal internal carotid diameter as the denominator. RADIATION DOSE REDUCTION: This exam was performed according to the departmental dose-optimization program which includes automated exposure control, adjustment of the mA and/or kV according to patient size and/or use of iterative reconstruction technique. CONTRAST:  75mL OMNIPAQUE IOHEXOL 350 MG/ML SOLN COMPARISON:  Same day MRI and CT. FINDINGS: CTA NECK FINDINGS Aortic arch: Great vessel origins are patent without significant stenosis. Right carotid system: No evidence of dissection, stenosis (50% or greater) or occlusion. Left carotid system: No evidence of dissection, stenosis (50% or greater) or occlusion. Vertebral arteries: Right dominant. No evidence of dissection, stenosis (50% or greater) or occlusion. Skeleton: Chronic mixed lytic and sclerotic changes throughout the skeleton, which could represent myelofibrosis or metabolic bone disorder. Other neck: No acute findings. Upper chest: Visualized lung apices are clear. Review of the MIP images confirms the above findings CTA HEAD FINDINGS Anterior circulation: Hypoplastic or absent left A1 ACA, probably congenital given prominent right A1 ACA supplying bilateral A2 ACAs. Otherwise, bilateral intracranial ICAs, MCAs, and ACAs are patent without proximal hemodynamically significant stenosis. 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 Moderate stenosis of the distal left intradural vertebral artery. Moderate stenosis of the mid basilar artery. Bilateral posterior cerebral arteries are patent with mild left P1 PCA stenosis and moderate left proximal P2 PCA stenosis. Venous  sinuses: Not well evaluated due to arterial timing. Anatomic variants: Detailed above. Review of the MIP images confirms the above findings IMPRESSION: 1. No large vessel occlusion. 2. Moderate stenosis of the distal left intradural vertebral artery, mid basilar artery and proximal left P2 PCA. 3. 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 4. Chronic mixed lytic and sclerotic changes throughout the skeleton, which could represent myelofibrosis or metabolic bone disorder. Findings further evaluated on prior PET-CT. Electronically Signed   By: Feliberto Harts M.D.   On: 04/27/2021 17:34   MR BRAIN WO CONTRAST  Result Date: 04/27/2021 CLINICAL DATA:  Neuro deficit, acute, stroke suspected EXAM: MRI HEAD WITHOUT CONTRAST TECHNIQUE: Multiplanar, multiecho pulse sequences of the brain and surrounding structures were obtained without intravenous contrast. COMPARISON:  Same day head CT. FINDINGS: Brain: Acute infarct in the left frontoparietal white matter. Mild associated edema without mass effect. Additional scattered patchy T2/FLAIR hyperintensities within the white matter. Small foci of encephalomalacia in the periatrial white matter bilaterally. Cerebral atrophy no evidence of acute hemorrhage, mass lesion, midline shift or extra-axial fluid collection. Vascular: Major arterial flow voids are maintained at the skull base. Skull and upper cervical spine: Diffuse T1 hypointensity the bone marrow without focal marrow replacing lesion. Sinuses/Orbits: Remote left medial orbital wall fracture. Clear  sinuses. Other: No mastoid effusions. IMPRESSION: 1. Acute infarct in the left frontoparietal white matter. Mild associated edema without mass effect. 2. Additional moderate scattered T2/FLAIR hyperintensities the white matter with small foci of encephalomalacia in the periatrial white matter bilateral. Findings are nonspecific but most likely  related to chronic microvascular disease. Chronic demyelination is a less likely differential consideration. 3. Diffuse T1 hypointensity of the bone marrow. This finding is nonspecific, but can be seen with chronic anemia, chronic hypoxia, or underlying lymphoproliferative disorder. Electronically Signed   By: Feliberto Harts M.D.   On: 04/27/2021 14:55   US RENAL  Result Date: 04/28/2021 CLINICAL DATA:  Stroke, hypertension EXAM: RENAL / URINARY TRACT ULTRASOUND COMPLETE COMPARISON:  None. FINDINGS: Right Kidney: Renal measurements: 12.1 x 5.4 x 5.5 cm = volume: 187 mL. Echogenicity within normal limits. No mass or hydronephrosis visualized. Left Kidney: Renal measurements: 11.6 x 5.2 x 5.2 cm = volume: 165 mL. Echogenicity within normal limits. No mass or hydronephrosis visualized. Bladder: Appears normal for degree of bladder distention. Other: Prostatomegaly, measuring 5.7 x 4.8 x 5.1 cm. IMPRESSION: 1. No hydronephrosis. 2. Prostatomegaly. Electronically Signed   By: Allegra Lai M.D.   On: 04/28/2021 16:05   ECHOCARDIOGRAM COMPLETE BUBBLE STUDY  Result Date: 04/27/2021    ECHOCARDIOGRAM REPORT   Patient Name:   CHAUNCE ORLOFF Deines Date of Exam: 04/27/2021 Medical Rec #:  [id number]     Height:       70.0 in Accession #:    [id number]    Weight:       189.0 lb Date of Birth:  Apr 09, 1955      BSA:          2.038 m Patient Age:    65 years      BP:           125/82 mmHg Patient Gender: M             HR:           81 bpm. Exam Location:  Inpatient Procedure: 2D Echo, Cardiac Doppler, Color Doppler and Saline Contrast Bubble            Study Indications:    Stroke  History:        Patient has no prior history of Echocardiogram examinations.  Sonographer:    Roosvelt Maser RDCS Referring Phys: [id number] Reyne Dumas [facility] IMPRESSIONS  1. Left ventricular ejection fraction, by estimation, is 60 to 65%. The left ventricle has normal function. The left ventricle has no regional wall motion abnormalities. Left ventricular  diastolic parameters are consistent with Grade I diastolic dysfunction (impaired relaxation).  2. Right ventricular systolic function is normal. The right ventricular size is normal.  3. The mitral valve is normal in structure. No evidence of mitral valve regurgitation. No evidence of mitral stenosis.  4. The aortic valve is normal in structure. Aortic valve regurgitation is not visualized. Aortic valve sclerosis is present, with no evidence of aortic valve stenosis.  5. The inferior vena cava is normal in size with greater than 50% respiratory variability, suggesting right atrial pressure of 3 mmHg.  6. Agitated saline contrast bubble study was negative, with no evidence of any interatrial shunt. FINDINGS  Left Ventricle: Left ventricular ejection fraction, by estimation, is 60 to 65%. The left ventricle has normal function. The left ventricle has no regional wall motion abnormalities. The left ventricular internal cavity size was normal in size. There is  no left ventricular hypertrophy. Left ventricular diastolic  parameters are consistent with Grade I diastolic dysfunction (impaired relaxation). Normal left ventricular filling pressure. Right Ventricle: The right ventricular size is normal. No increase in right ventricular wall thickness. Right ventricular systolic function is normal. Left Atrium: Left atrial size was normal in size. Right Atrium: Right atrial size was normal in size. Pericardium: There is no evidence of pericardial effusion. Mitral Valve: The mitral valve is normal in structure. No evidence of mitral valve regurgitation. No evidence of mitral valve stenosis. Tricuspid Valve: The tricuspid valve is normal in structure. Tricuspid valve regurgitation is not demonstrated. No evidence of tricuspid stenosis. Aortic Valve: The aortic valve is normal in structure. Aortic valve regurgitation is not visualized. Aortic valve sclerosis is present, with no evidence of aortic valve stenosis. Aortic valve mean  gradient measures 3.0 mmHg. Aortic valve peak gradient measures 5.5 mmHg. Aortic valve area, by VTI measures 2.18 cm. Pulmonic Valve: The pulmonic valve was normal in structure. Pulmonic valve regurgitation is not visualized. No evidence of pulmonic stenosis. Aorta: The aortic root is normal in size and structure. Venous: The inferior vena cava is normal in size with greater than 50% respiratory variability, suggesting right atrial pressure of 3 mmHg. IAS/Shunts: No atrial level shunt detected by color flow Doppler. Agitated saline contrast was given intravenously to evaluate for intracardiac shunting. Agitated saline contrast bubble study was negative, with no evidence of any interatrial shunt.  LEFT VENTRICLE PLAX 2D LVIDd:         3.70 cm   Diastology LVIDs:         2.40 cm   LV e' medial:    5.64 cm/s LV PW:         1.00 cm   LV E/e' medial:  9.4 LV IVS:        1.00 cm   LV e' lateral:   9.49 cm/s LVOT diam:     2.10 cm   LV E/e' lateral: 5.6 LV SV:         52 LV SV Index:   26 LVOT Area:     3.46 cm  RIGHT VENTRICLE RV Basal diam:  3.10 cm RV S prime:     13.90 cm/s TAPSE (M-mode): 1.9 cm LEFT ATRIUM             Index        RIGHT ATRIUM           Index LA diam:        3.70 cm 1.82 cm/m   RA Area:     13.00 cm LA Vol (A2C):   51.5 ml 25.27 ml/m  RA Volume:   30.00 ml  14.72 ml/m LA Vol (A4C):   39.6 ml 19.43 ml/m LA Biplane Vol: 47.6 ml 23.36 ml/m  AORTIC VALVE AV Area (Vmax):    2.39 cm AV Area (Vmean):   2.24 cm AV Area (VTI):     2.18 cm AV Vmax:           117.00 cm/s AV Vmean:          79.900 cm/s AV VTI:            0.240 m AV Peak Grad:      5.5 mmHg AV Mean Grad:      3.0 mmHg LVOT Vmax:         80.70 cm/s LVOT Vmean:        51.700 cm/s LVOT VTI:          0.151 m LVOT/AV VTI  ratio: 0.63  AORTA Ao Root diam: 3.30 cm MITRAL VALVE MV Area (PHT): 4.17 cm    SHUNTS MV Decel Time: 182 msec    Systemic VTI:  0.15 m MV E velocity: 53.00 cm/s  Systemic Diam: 2.10 cm MV A velocity: 78.90 cm/s MV E/A  ratio:  0.67 Mihai Croitoru MD Electronically signed by Thurmon Fair MD Signature Date/Time: 04/27/2021/5:50:59 PM    Final       Blood pressure 108/73, pulse 83, temperature 98.6 F (37 C), temperature source Oral, resp. rate 20, height 5\' 10"  (1.778 m), weight 83.9 kg, SpO2 97 %.  Medical Problem List and Plan: 1. Functional deficits secondary to acute left frontoparietal infarct  -patient may  shower  -ELOS/Goals: 7-10 days supervision 2.  Antithrombotics: -DVT/anticoagulation:  Pharmaceutical: Lovenox  -antiplatelet therapy: Plavix and aspirin for 3 weeks then aspirin alone 3. Pain Management: Tylenol 4. Mood: LCSW to evaluate and provide emotional support  -antipsychotic agents: Not applicable 5. Neuropsych: This patient is capable of making decisions on his own behalf. 6. Skin/Wound Care: Routine skin care checks 7. Fluids/Electrolytes/Nutrition: Routine I's and O's and follow-up chemistries 8: Cerebral aneurysm: follow-up IR as outpatient 9: Hyperlipidemia:: continue Crestor 20 mg daily 10: Essential hypertension: Continue amlodipine 5 mg daily -- Proscar 5 mg daily 11: Polycythemia: Follow-up medical oncology as outpatient 12: GI prophylaxis: Continue Protonix 13: BPH: Continue Flomax 14. Constipation- LBM 3 days ago- will give miralax.    I have personally performed a face to face diagnostic evaluation of this patient and formulated the key components of the plan.  Additionally, I have personally reviewed laboratory data, imaging studies, as well as relevant notes and concur with the physician assistant's documentation above.   The patient's status has not changed from the original H&P.  Any changes in documentation from the acute care chart have been noted above.     Milinda Antis, PA-C 04/29/2021

## 2021-04-29 NOTE — Progress Notes (Signed)
Paged by patient's nurse, Lonn Georgia, due to concerns re: change in NIH score. Lonn Georgia explained that when documenting his NIH score this morning, it was largely increased due to changes in RLE drift. I evaluated the patient at bedside and he stated that he was feeling much better personally today regarding his health and diagnosis. He does not feel that his R sided weakness is worse at this time. Family in the room endorses that he seems overall better today and that he was able to stand up in the room earlier in the morning. ? ?Constitutional:Patient is resting comfortably in bed, no acute distress. ?UUE:[id number] muscular fullness over RUE with some tingling in the RUE reported. ?Neuro: ?Mental Status: ?Patient is awake, alert, oriented to self, location, time. ?No signs of aphasia or neglect ?Cranial Nerves: ?III,IV, VI: EOMI without ptosis or diploplia.  ?V: Facial sensation is symmetric to light touch and temperature. ?VII: Facial movement is symmetric.  ?VIII: Hearing is intact to voice ?XI: Shoulder shrug present bilaterally, 4/5 on R and 5/5 on L. ?XII: Tongue is midline without atrophy or fasciculations.  ?Motor: 0/5 RUE 5/5 LUE 2-3/5 RLE 5/5 LLE. Lifts bilateral LE without difficulty, some drift noted on R. Overall strength exam unchanged from yesterday. ?Sensory: Sensation is grossly intact and equal to bilateral UE & LE. Reports some tingling to upper aspect of RUE. ? ?Assessment: Overall neurological exam is unchanged from yesterday. Will defer re-imaging at this time. ?Plan: CIR placement pending at this time. Continue to closely monitor patient for changes in neurological status with low threshold for re-imaging.  ? ?Aaron Gordon, DO  ?Internal Medicine Resident PGY-1 ?[place]  ?Pager: [phone] ?

## 2021-04-30 LAB — CBC WITH DIFFERENTIAL/PLATELET
Abs Immature Granulocytes: 0.03 10*3/uL (ref 0.00–0.07)
Basophils Absolute: 0 10*3/uL (ref 0.0–0.1)
Basophils Relative: 1 %
Eosinophils Absolute: 0 10*3/uL (ref 0.0–0.5)
Eosinophils Relative: 1 %
HCT: 49.1 % (ref 39.0–52.0)
Hemoglobin: 16.1 g/dL (ref 13.0–17.0)
Immature Granulocytes: 1 %
Lymphocytes Relative: 33 %
Lymphs Abs: 2.1 10*3/uL (ref 0.7–4.0)
MCH: 27.3 pg (ref 26.0–34.0)
MCHC: 32.8 g/dL (ref 30.0–36.0)
MCV: 83.2 fL (ref 80.0–100.0)
Monocytes Absolute: 0.6 10*3/uL (ref 0.1–1.0)
Monocytes Relative: 9 %
Neutro Abs: 3.7 10*3/uL (ref 1.7–7.7)
Neutrophils Relative %: 55 %
Platelets: 213 10*3/uL (ref 150–400)
RBC: 5.9 MIL/uL — ABNORMAL HIGH (ref 4.22–5.81)
RDW: 13.6 % (ref 11.5–15.5)
WBC: 6.5 10*3/uL (ref 4.0–10.5)
nRBC: 0 % (ref 0.0–0.2)

## 2021-04-30 LAB — PROTEIN ELECTROPHORESIS, SERUM
A/G Ratio: 1.1 (ref 0.7–1.7)
Albumin ELP: 3.2 g/dL (ref 2.9–4.4)
Alpha-1-Globulin: 0.2 g/dL (ref 0.0–0.4)
Alpha-2-Globulin: 0.6 g/dL (ref 0.4–1.0)
Beta Globulin: 0.9 g/dL (ref 0.7–1.3)
Gamma Globulin: 1 g/dL (ref 0.4–1.8)
Globulin, Total: 2.8 g/dL (ref 2.2–3.9)
Total Protein ELP: 6 g/dL (ref 6.0–8.5)

## 2021-04-30 LAB — COMPREHENSIVE METABOLIC PANEL
ALT: 13 U/L (ref 0–44)
AST: 18 U/L (ref 15–41)
Albumin: 3.4 g/dL — ABNORMAL LOW (ref 3.5–5.0)
Alkaline Phosphatase: 102 U/L (ref 38–126)
Anion gap: 7 (ref 5–15)
BUN: 14 mg/dL (ref 8–23)
CO2: 27 mmol/L (ref 22–32)
Calcium: 8.8 mg/dL — ABNORMAL LOW (ref 8.9–10.3)
Chloride: 105 mmol/L (ref 98–111)
Creatinine, Ser: 0.94 mg/dL (ref 0.61–1.24)
GFR, Estimated: >60 mL/min (ref >60)
Glucose, Bld: 89 mg/dL (ref 70–99)
Potassium: 3.4 mmol/L — ABNORMAL LOW (ref 3.5–5.1)
Sodium: 139 mmol/L (ref 135–145)
Total Bilirubin: 2.2 mg/dL — ABNORMAL HIGH (ref 0.3–1.2)
Total Protein: 6.4 g/dL — ABNORMAL LOW (ref 6.5–8.1)

## 2021-04-30 MED ORDER — POLYETHYLENE GLYCOL 3350 17 G PO PACK
17.0000 g | PACK | Freq: Once | ORAL | Status: AC
  Administered 2021-04-30: 17 g via ORAL
  Filled 2021-04-30: qty 1

## 2021-04-30 MED ORDER — POTASSIUM CHLORIDE CRYS ER 20 MEQ PO TBCR
40.0000 meq | EXTENDED_RELEASE_TABLET | Freq: Once | ORAL | Status: AC
  Administered 2021-04-30: 40 meq via ORAL
  Filled 2021-04-30: qty 2

## 2021-04-30 NOTE — Discharge Summary (Signed)
Physician Discharge Summary  ?Patient ID: ?Aaron Dennis ?MRN: [id number] ?DOB/AGE: 1955-10-01 66 y.o. ? ?Admit date: 04/29/2021 ?Discharge date: 05/20/2021 ? ?Discharge Diagnoses:  ?Principal Problem: ?  Stroke (cerebrum) ([place]) ?Active Problems: ?  Status post stroke due to cerebrovascular disease ?Active problems: ?Functional deficits secondary to stroke ?Cerebral aneurysm ?Hyperlipidemia ?Essential hypertension ?Polycythemia ?BPH ?Constipation ?Hypokalemia ?Insomnia ? ?Discharged Condition: good ? ?Significant Diagnostic Studies: ?CT ANGIO HEAD NECK W WO CM ? ?Result Date: 04/27/2021 ?CLINICAL DATA:  Stroke/TIA, determine embolic source EXAM: CT ANGIOGRAPHY HEAD AND NECK TECHNIQUE: Multidetector CT imaging of the head and neck was performed using the standard protocol during bolus administration of intravenous contrast. Multiplanar CT image reconstructions and MIPs were obtained to evaluate the vascular anatomy. Carotid stenosis measurements (when applicable) are obtained utilizing NASCET criteria, using the distal internal carotid diameter as the denominator. RADIATION DOSE REDUCTION: This exam was performed according to the departmental dose-optimization program which includes automated exposure control, adjustment of the mA and/or kV according to patient size and/or use of iterative reconstruction technique. CONTRAST:  53m OMNIPAQUE IOHEXOL 350 MG/ML SOLN COMPARISON:  Same day MRI and CT. FINDINGS: CTA NECK FINDINGS Aortic arch: Great vessel origins are patent without significant stenosis. Right carotid system: No evidence of dissection, stenosis (50% or greater) or occlusion. Left carotid system: No evidence of dissection, stenosis (50% or greater) or occlusion. Vertebral arteries: Right dominant. No evidence of dissection, stenosis (50% or greater) or occlusion. Skeleton: Chronic mixed lytic and sclerotic changes throughout the skeleton, which could represent myelofibrosis or metabolic bone disorder. Other neck:  No acute findings. Upper chest: Visualized lung apices are clear. Review of the MIP images confirms the above findings CTA HEAD FINDINGS Anterior circulation: Hypoplastic or absent left A1 ACA, probably congenital given prominent right A1 ACA supplying bilateral A2 ACAs. Otherwise, bilateral intracranial ICAs, MCAs, and ACAs are patent without proximal hemodynamically significant stenosis. 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 Moderate stenosis of the distal left intradural vertebral artery. Moderate stenosis of the mid basilar artery. Bilateral posterior cerebral arteries are patent with mild left P1 PCA stenosis and moderate left proximal P2 PCA stenosis. Venous sinuses: Not well evaluated due to arterial timing. Anatomic variants: Detailed above. Review of the MIP images confirms the above findings IMPRESSION: 1. No large vessel occlusion. 2. Moderate stenosis of the distal left intradural vertebral artery, mid basilar artery and proximal left P2 PCA. 3. 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 4. Chronic mixed lytic and sclerotic changes throughout the skeleton, which could represent myelofibrosis or metabolic bone disorder. Findings further evaluated on prior PET-CT. Electronically Signed   By: FMargaretha SheffieldM.D.   On: 04/27/2021 17:34  ? ?CT HEAD WO CONTRAST ? ?Result Date: 04/27/2021 ?CLINICAL DATA:  Neurological deficit, right-sided weakness EXAM: CT HEAD WITHOUT CONTRAST TECHNIQUE: Contiguous axial images were obtained from the base of the skull through the vertex without intravenous contrast. RADIATION DOSE REDUCTION: This exam was performed according to the departmental dose-optimization program which includes automated exposure control, adjustment of the  mA and/or kV according to patient size and/or use of iterative reconstruction technique. COMPARISON:  07/12/2017 FINDINGS: Brain: No acute intracranial findings are seen. Cortical sulci are prominent. There is decreased density in periventricular and subcortical white matter. There are small foci of  decreased density in the basal ganglia on both sides. There is no focal mass effect. There is no shift of midline structures. Vascular: Unremarkable. Skull: Unremarkable. Sinuses/Orbits: There is possible old blowout fracture in the medial wall of left orbit. Other: None IMPRESSION: No acute intracranial findings are seen in the noncontrast CT brain. Atrophy. Small-vessel disease. Electronically Signed   By: Elmer Picker M.D.   On: 04/27/2021 10:51  ? ?MR BRAIN WO CONTRAST ? ?Result Date: 04/27/2021 ?CLINICAL DATA:  Neuro deficit, acute, stroke suspected EXAM: MRI HEAD WITHOUT CONTRAST TECHNIQUE: Multiplanar, multiecho pulse sequences of the brain and surrounding structures were obtained without intravenous contrast. COMPARISON:  Same day head CT. FINDINGS: Brain: Acute infarct in the left frontoparietal white matter. Mild associated edema without mass effect. Additional scattered patchy T2/FLAIR hyperintensities within the white matter. Small foci of encephalomalacia in the periatrial white matter bilaterally. Cerebral atrophy no evidence of acute hemorrhage, mass lesion, midline shift or extra-axial fluid collection. Vascular: Major arterial flow voids are maintained at the skull base. Skull and upper cervical spine: Diffuse T1 hypointensity the bone marrow without focal marrow replacing lesion. Sinuses/Orbits: Remote left medial orbital wall fracture. Clear sinuses. Other: No mastoid effusions. IMPRESSION: 1. Acute infarct in the left frontoparietal white matter. Mild associated edema without mass effect. 2. Additional moderate scattered T2/FLAIR hyperintensities the white matter with small foci of  encephalomalacia in the periatrial white matter bilateral. Findings are nonspecific but most likely related to chronic microvascular disease. Chronic demyelination is a less likely differential consideration. 3. Diffuse T1 hypointensity of the bone marrow. This finding is nonspecific, but can be seen with chronic anemia, chronic hypoxia, or underlying lymphoproliferative disorder. Electronically Signed   By: Margaretha Sheffield M.D.   On: 04/27/2021 14:55  ? ?US RENAL ? ?Result Date: 04/28/2021 ?CLINICAL DATA:  Stroke, hypertension EXAM: RENAL / URINARY TRACT ULTRASOUND COMPLETE COMPARISON:  None. FINDINGS: Right Kidney: Renal measurements: 12.1 x 5.4 x 5.5 cm = volume: 187 mL. Echogenicity within normal limits. No mass or hydronephrosis visualized. Left Kidney: Renal measurements: 11.6 x 5.2 x 5.2 cm = volume: 165 mL. Echogenicity within normal limits. No mass or hydronephrosis visualized. Bladder: Appears normal for degree of bladder distention. Other: Prostatomegaly, measuring 5.7 x 4.8 x 5.1 cm. IMPRESSION: 1. No hydronephrosis. 2. Prostatomegaly. Electronically Signed   By: Yetta Glassman M.D.   On: 04/28/2021 16:05  ? ?ECHOCARDIOGRAM COMPLETE BUBBLE STUDY ? ?Result Date: 04/27/2021 ?   ECHOCARDIOGRAM REPORT   Patient Name:   ASAEL PANN Pfiffner Date of Exam: 04/27/2021 Medical Rec #:  [id number]     Height:       70.0 in Accession #:    [id number]    Weight:       189.0 lb Date of Birth:  1955/10/12      BSA:          2.038 m? Patient Age:    8 years      BP:           125/82 mmHg Patient Gender: M             HR:           81 bpm. Exam Location:  Inpatient Procedure: 2D Echo, Cardiac Doppler, Color Doppler and Saline Contrast Bubble            Study Indications:    Stroke  History:        Patient has no prior history of Echocardiogram examinations.  Sonographer:  Merrie Roof RDCS Referring Phys: [id number] [place]  1. Left ventricular ejection fraction, by estimation, is 60 to 65%. The left ventricle  has normal function. The left ventricle has no regional wall motion abnormalities. Left ventricular diastolic parameters are consistent with Grade I diastolic dysfunction (impaired relaxation).  2. Right ventric

## 2021-04-30 NOTE — Progress Notes (Signed)
Slept fairly well last night. Lot of family members visited. Patient settled in quite late. Denies pain. Voided X 2 this shift with urinal. PVR this morning was 116. Urine quite concentrated. Encourage to drink fluids. Hemodynamically stable Call bell within reach. Safety ensured at all times. ?

## 2021-04-30 NOTE — Progress Notes (Signed)
Inpatient Rehabilitation  Patient information reviewed and entered into eRehab system by Dayanira Giovannetti M. Heinz Eckert, M.A., CCC/SLP, PPS Coordinator.  Information including medical coding, functional ability and quality indicators will be reviewed and updated through discharge.    

## 2021-04-30 NOTE — Progress Notes (Signed)
?                                                       PROGRESS NOTE ? ? ?Subjective/Complaints: ? ?Pt reports already getting more leg movement back-  ?Slept well- no issues.  ?Bowels "OK" but no BM documented.  ?Doesn't feel constipated- eating well.  ? ?ROS: ? ?Pt denies SOB, abd pain, CP, N/V/C/D, and vision changes ? ? ?Objective: ?  ?US RENAL ? ?Result Date: 04/28/2021 ?CLINICAL DATA:  Stroke, hypertension EXAM: RENAL / URINARY TRACT ULTRASOUND COMPLETE COMPARISON:  None. FINDINGS: Right Kidney: Renal measurements: 12.1 x 5.4 x 5.5 cm = volume: 187 mL. Echogenicity within normal limits. No mass or hydronephrosis visualized. Left Kidney: Renal measurements: 11.6 x 5.2 x 5.2 cm = volume: 165 mL. Echogenicity within normal limits. No mass or hydronephrosis visualized. Bladder: Appears normal for degree of bladder distention. Other: Prostatomegaly, measuring 5.7 x 4.8 x 5.1 cm. IMPRESSION: 1. No hydronephrosis. 2. Prostatomegaly. Electronically Signed   By: Yetta Glassman M.D.   On: 04/28/2021 16:05   ?Recent Labs  ?  04/29/21 ?0409 04/30/21 ?6568  ?WBC 6.6 6.5  ?HGB 15.6 16.1  ?HCT 46.3 49.1  ?PLT 222 213  ? ?Recent Labs  ?  04/29/21 ?0409 04/30/21 ?1275  ?NA 136 139  ?K 3.7 3.4*  ?CL 107 105  ?CO2 22 27  ?GLUCOSE 91 89  ?BUN 17 14  ?CREATININE 0.97 0.94  ?CALCIUM 8.5* 8.8*  ? ? ?Intake/Output Summary (Last 24 hours) at 04/30/2021 0836 ?Last data filed at 04/30/2021 0500 ?Gross per 24 hour  ?Intake --  ?Output 916 ml  ?Net -916 ml  ?  ? ?  ? ?Physical Exam: ?Vital Signs ?Blood pressure 138/88, pulse 83, temperature 97.9 ?F (36.6 ?C), temperature source Oral, resp. rate 14, height '5\' 10"'$  (1.778 m), weight 76.5 kg, SpO2 99 %. ? ? ?General: awake, alert, appropriate, sitting EOB with OT working on dressing/balance; son in room; NAD ?HENT: conjugate gaze; oropharynx moist ?CV: regular rate; no JVD ?Pulmonary: CTA B/L; no W/R/R- good air movement ?GI: soft, NT, ND, (+)BS ?Psychiatric: appropriate ?Neurological:  alert- arabic first language ?   Cervical back: Neck supple. No tenderness.  ?   Comments: LUE/LLE 5/5 ?RUE- 0/5 ?RLE- HF and KE/KF 2-/5; DF 2+/5; PF 2-/5 ?  ?Skin: ?   General: Skin is warm and dry.  ?   Comments: B/L forearm IV- L AC fossa IV looks  little red- but RUE looks OK ?  ?Neurological:  ?   Mental Status: He is alert and oriented to person, place, and time.  ?   Comments: Speech is at baseline per pt= per son Arabic, primary language is good ?Intact to light touch x 4 extremities  ? ? ?Assessment/Plan: ?1. Functional deficits which require 3+ hours per day of interdisciplinary therapy in a comprehensive inpatient rehab setting. ?Physiatrist is providing close team supervision and 24 hour management of active medical problems listed below. ?Physiatrist and rehab team continue to assess barriers to discharge/monitor patient progress toward functional and medical goals ? ?Care Tool: ? ?Bathing ? Bathing activity did not occur: Safety/medical concerns ?Body parts bathed by patient: Right arm, Chest, Front perineal area, Abdomen, Right upper leg, Left upper leg, Face  ? Body parts bathed by helper: Left arm, Buttocks, Left  lower leg, Right lower leg ?  ?  ?Bathing assist Assist Level: Moderate Assistance - Patient 50 - 74% ?  ?  ?Upper Body Dressing/Undressing ?Upper body dressing Upper body dressing/undressing activity did not occur (including orthotics): Safety/medical concerns ?What is the patient wearing?: Pull over shirt ?   ?Upper body assist Assist Level: Moderate Assistance - Patient 50 - 74% ?   ?Lower Body Dressing/Undressing ?Lower body dressing ? ? ? Lower body dressing activity did not occur: Safety/medical concerns ?What is the patient wearing?: Pants, Underwear/pull up ? ?  ? ?Lower body assist Assist for lower body dressing: Maximal Assistance - Patient 25 - 49% ?   ? ?Toileting ?Toileting Toileting Activity did not occur (Probation officer and hygiene only): N/A (no void or bm)  ?Toileting  assist   ?  ?  ?Transfers ?Chair/bed transfer ? ?Transfers assist ? Chair/bed transfer activity did not occur: Safety/medical concerns ? ?Chair/bed transfer assist level: Moderate Assistance - Patient 50 - 74% ?  ?  ?Locomotion ?Ambulation ? ? ?Ambulation assist ? ? Ambulation activity did not occur: Safety/medical concerns ? ?  ?  ?   ? ?Walk 10 feet activity ? ? ?Assist ?   ? ?  ?   ? ?Walk 50 feet activity ? ? ?Assist   ? ?  ?   ? ? ?Walk 150 feet activity ? ? ?Assist   ? ?  ?  ?  ? ?Walk 10 feet on uneven surface  ?activity ? ? ?Assist   ? ? ?  ?   ? ?Wheelchair ? ? ? ? ?Assist   ?  ?  ? ?  ?   ? ? ?Wheelchair 50 feet with 2 turns activity ? ? ? ?Assist ? ?  ?  ? ? ?   ? ?Wheelchair 150 feet activity  ? ? ? ?Assist ?   ? ? ?   ? ?Blood pressure 138/88, pulse 83, temperature 97.9 ?F (36.6 ?C), temperature source Oral, resp. rate 14, height '5\' 10"'$  (1.778 m), weight 76.5 kg, SpO2 99 %. ? ?Medical Problem List and Plan: ?1. Functional deficits secondary to acute left frontoparietal infarct ?            -patient may  shower ?            -ELOS/Goals: 7-10 days supervision ? First day of evaluations- Con't CIR PT and OT- SLP consult ?2.  Antithrombotics: ?-DVT/anticoagulation:  Pharmaceutical: Lovenox ?            -antiplatelet therapy: Plavix and aspirin for 3 weeks then aspirin alone ?3. Pain Management: Tylenol ?4. Mood: LCSW to evaluate and provide emotional support ?            -antipsychotic agents: Not applicable ?5. Neuropsych: This patient is capable of making decisions on his own behalf. ?6. Skin/Wound Care: Routine skin care checks ? 3/22- will remove IV"s.  ?7. Fluids/Electrolytes/Nutrition: Routine I's and O's and follow-up chemistries ?8: Cerebral aneurysm: follow-up IR as outpatient ?9: Hyperlipidemia:: continue Crestor 20 mg daily ?10: Essential hypertension: Continue amlodipine 5 mg daily ?-- Proscar 5 mg daily ?3/22- BP controlled- con't regimen ?11: Polycythemia: Follow-up medical oncology as  outpatient ?12: GI prophylaxis: Continue Protonix ?13: BPH: Continue Flomax ? 3/22- emptying except ~ 100cc- will con't bladder scans for now ?14. Constipation- LBM 3 days ago- will give miralax prn ?3/22- will give miralax x1 this AM- if doesn't work, will give sorbitol tomorrow  ? 15/ hypokalemia ?  3/22- will replete 40 mEq x1 and recheck Thursday  ?16. Dispo3/22- 1 family member can stay overnight.  ? ?I spent a total of  35  minutes on total care today- >50% coordination of care- due to d/w son and OT about progress/eval.  ? ? ?LOS: ?1 days ?A FACE TO FACE EVALUATION WAS PERFORMED ? ?Lakeeta Dobosz ?04/30/2021, 8:36 AM  ? ? ? ?

## 2021-04-30 NOTE — Progress Notes (Signed)
Physical Therapy Session Note ? ?Patient Details  ?Name: Aaron Dennis ?MRN: [id number] ?Date of Birth: [birth year] ? ?Today's Date: 04/30/2021 ?PT Individual Time: 5189-8421 ?PT Individual Time Calculation (min): 53 min  ? ?Short Term Goals: ?Week 1:  PT Short Term Goal 1 (Week 1): Pt will initate gait training ?PT Short Term Goal 2 (Week 1): pt will propel w/c with min A or better ?PT Short Term Goal 3 (Week 1): Pt will perform squat pivot with min A or better ? ?Skilled Therapeutic Interventions/Progress Updates:  ? ?Pt received supine in bed and agreeable to PT. Supine>sit transfer with max assist and cues for sequencing of log roll with poor caryover. Scooting to EOB with mod assist and cues for improved movement through the R pelvis. Slide board to [facility] with min-mod assist for management of the RLE.  ? ?Seated hip/knee flexion/extension x 8 on the RLE with AAROM into flexion and mild manual resistance into extension.  ? ?Sit<>stand from [facility] with mod assist and RLE blocked. Weight shifting R and L with LUE supported on HW with blocking the RLE intermittently x 10 bil. Pregait stepping RLE and LLE x 5 each with cues for attention to the RLE to sustain knee extension on the R.  ? ?Gait training with HW x 62f with mod-max assist from PT from PT for improved posture, knee extension on the RLE, and assist for increased step length on the R LE.  ? ?Kinetron reciprocal movement training 3 x 2 min with mod-max assist from PT to engage hip flexion and improved reciprocal movement PATTERN.  ? ?Pt returned to room and performed ambulatory transfer to bed with HW and mod-max assist for advancement of RLE and activation of knee extension in stance. Sit>supine completed with min assist on the RLE, and left supine in bed with call bell in reach and all needs met.  ? ? ?  ? ? ?   ? ?Therapy Documentation ?Precautions:  ?Precautions ?Precautions: Fall ?Restrictions ?Weight Bearing Restrictions: No ? ?   ?Pain: ?denies ? ? ?Therapy/Group: Individual Therapy ? ?ALorie Phenix?04/30/2021, 5:44 PM  ?

## 2021-04-30 NOTE — Evaluation (Signed)
Physical Therapy Assessment and Plan ? ?Patient Details  ?Name: Aaron Dennis ?MRN: [id number] ?Date of Birth: 12-05-1955 ? ?PT Diagnosis: Abnormal posture, Coordination disorder, Difficulty walking, Edema, and Hemiparesis dominant ?Rehab Potential: Good ?ELOS: ~3 weeks  ? ?Today's Date: 04/30/2021 ?PT Individual Time: 0905-1000 ?PT Individual Time Calculation (min): 55 min   ? ?Hospital Problem: Principal Problem: ?  Stroke (cerebrum) ([place]) ? ? ?Past Medical History:  ?Past Medical History:  ?Diagnosis Date  ? GERD (gastroesophageal reflux disease)   ? Hypertension   ? Lung nodule   ? 7 mm LUL nodule  ? ?Past Surgical History: History reviewed. No pertinent surgical history. ? ?Assessment & Plan ?Clinical Impression:  Aaron Dennis is a 66 year old male who presented to the emergency department at [facility] on 04/27/2021 with complaints of right-sided weakness he stated had been ongoing since Thursday night.  Symptoms were acute in onset and he had no mental status changes.  He was in route from [place] to the [place] at the time of the symptoms.  Imaging work-up included head CT which was negative, MRI of the brain and lower extremity venous ultrasound.  The patient was seen in consultation by Dr. Leonel Ramsay.  MRI revealed an acute infarction in the left frontoparietal lobe with associated edema without mass effect.  She was well outside the window for thrombolytic therapy.  Venous ultrasound was negative for lower extremity DVT.  He was started on aspirin and Plavix therapy he will continue Plavix for 3 weeks then aspirin alone.  His Lipitor was increased from 10 mg to Crestor 20 mg.  Also noted on imaging was a 4 x 3 cerebral aneurysm and Dr. [name] discussed the findings with Dr. Estanislado Pandy.  Recommendations were to follow-up with interventional radiology as an outpatient.  CTA of head and neck showed multifocal intracranial stenosis including the above-mentioned aneurysm.  Echocardiography revealed  ejection fraction of 60 to 65%.  The patient remained hemodynamically stable. Significant left-sided weakness.  No dysarthria or dysphagia.  Heart healthy diet.  The patient requires inpatient medicine and rehabilitation evaluations and services for ongoing dysfunction secondary to left frontoparietal acute infarct. Patient transferred to CIR on 04/29/2021 .  ? ?Patient currently requires mod with mobility secondary to muscle weakness, decreased cardiorespiratoy endurance, impaired timing and sequencing, abnormal tone, decreased coordination, and decreased motor planning, and hemiplegia.  Prior to hospitalization, patient was independent  with mobility and lived with Spouse, Son in a House home.  Home access is 3Stairs to enter. ? ?Patient will benefit from skilled PT intervention to maximize safe functional mobility, minimize fall risk, and decrease caregiver burden for planned discharge home with 24 hour supervision.  Anticipate patient will benefit from follow up OP at discharge. ? ?PT - End of Session ?Activity Tolerance: Tolerates 30+ min activity with multiple rests ?Endurance Deficit: No ?PT Assessment ?Rehab Potential (ACUTE/IP ONLY): Good ?PT Barriers to Discharge: Inaccessible home environment;Decreased caregiver support ?PT Patient demonstrates impairments in the following area(s): Balance;Perception;Edema;Safety;Motor ?PT Transfers Functional Problem(s): Bed Mobility;Bed to Chair;Car;Furniture ?PT Locomotion Functional Problem(s): Ambulation;Wheelchair Mobility;Stairs ?PT Plan ?PT Intensity: Minimum of 1-2 x/day ,45 to 90 minutes ?PT Frequency: 5 out of 7 days ?PT Duration Estimated Length of Stay: ~3 weeks ?PT Treatment/Interventions: Ambulation/gait training;Cognitive remediation/compensation;Discharge planning;DME/adaptive equipment instruction;Functional mobility training;Pain management;Psychosocial support;Splinting/orthotics;Therapeutic Activities;UE/LE Strength taining/ROM;Visual/perceptual  remediation/compensation;Wheelchair propulsion/positioning;UE/LE Coordination activities;Therapeutic Exercise;Stair training;Skin care/wound management;Patient/family education;Neuromuscular re-education;Functional electrical stimulation;Disease management/prevention;Community reintegration;Balance/vestibular training ?PT Transfers Anticipated Outcome(s): supervision ?PT Locomotion Anticipated Outcome(s): mod I w/c, CGA short distance  gait ?PT Recommendation ?Recommendations for Other Services: Speech consult ?Follow Up Recommendations: Home health PT;Outpatient PT (TBD OPPT pending transportation) ?Patient destination: Home ?Equipment Recommended: To be determined ?Equipment Details: likey hemiwalker, w/c TBD ? ? ?PT Evaluation ?Precautions/Restrictions ?Precautions ?Precautions: Fall ?Restrictions ?Weight Bearing Restrictions: No ?General ?  Vital Signs  ?Pain ?Pain Assessment ?Pain Scale: Faces ?Faces Pain Scale: Hurts a little bit ?Pain Type: Acute pain ?Pain Location: Shoulder ?Pain Orientation: Right ?Pain Descriptors / Indicators: Sharp ?Pain Interference ?Pain Interference ?Pain Effect on Sleep: 1. Rarely or not at all ?Pain Interference with Therapy Activities: 1. Rarely or not at all ?Pain Interference with Day-to-Day Activities: 1. Rarely or not at all ?Home Living/Prior Functioning ?Home Living ?Available Help at Discharge: Family;Available 24 hours/day ?Type of Home: House ?Home Access: Stairs to enter ?Entrance Stairs-Number of Steps: 3 ?Entrance Stairs-Rails: Left ?Home Layout: One level ?Bathroom Shower/Tub: Tub/shower unit;Curtain ?Bathroom Toilet: Standard ?Additional Comments: RW-4 wheels, no seat. Just returned from [place] 4 days ago ? Lives With: Spouse;Son ?Prior Function ?Level of Independence: Independent with gait;Independent with homemaking with ambulation ? Able to Take Stairs?: Yes ?Driving: Yes ?Vision/Perception  ?Vision - History ?Ability to See in Adequate Light: 0 Adequate ?Vision -  Assessment ?Eye Alignment: Within Functional Limits ?Ocular Range of Motion: Within Functional Limits ?Alignment/Gaze Preference: Head tilt ?Tracking/Visual Pursuits: Decreased smoothness of eye movement to RIGHT inferior field;Decreased smoothness of eye movement to RIGHT superior field ?Convergence: Within functional limits ?Perception ?Perception: Impaired ?Inattention/Neglect: Other (comment) ?Praxis ?Praxis: Intact  ?Cognition ?Overall Cognitive Status: Within Functional Limits for tasks assessed ?Arousal/Alertness: Awake/alert ?Orientation Level: Oriented X4 ?Year: 2023 ?Month: March ?Day of Week: Correct ?Attention: Focused;Sustained ?Focused Attention: Appears intact ?Sustained Attention: Appears intact ?Memory: Impaired ?Memory Impairment: Decreased short term memory ?Decreased Short Term Memory: Verbal complex ?Problem Solving: Appears intact ?Reasoning: Appears intact ?Safety/Judgment: Appears intact ?Sensation ?Sensation ?Light Touch: Impaired Detail ?Peripheral sensation comments: Diminished RLe ?Light Touch Impaired Details: Impaired RLE ?Hot/Cold: Appears Intact ?Proprioception: Not tested ?Stereognosis: Not tested ?Coordination ?Gross Motor Movements are Fluid and Coordinated: No ?Fine Motor Movements are Fluid and Coordinated: No ?Coordination and Movement Description: R hemiplegia ?Finger Nose Finger Test: Unable to assess on R 2/2 R hemiplegia ?Motor  ?Motor ?Motor: Hemiplegia;Abnormal postural alignment and control;Abnormal tone ?Motor - Skilled Clinical Observations: R hemiplegia  ? ?Trunk/Postural Assessment  ?Cervical Assessment ?Cervical Assessment: Within Functional Limits ?Thoracic Assessment ?Thoracic Assessment: Within Functional Limits ?Lumbar Assessment ?Lumbar Assessment: Within Functional Limits ?Postural Control ?Postural Control: Deficits on evaluation  ?Balance ?Balance ?Balance Assessed: Yes ?Static Sitting Balance ?Static Sitting - Balance Support: Left upper extremity  supported;Feet supported ?Static Sitting - Level of Assistance: 5: Stand by assistance ?Dynamic Sitting Balance ?Dynamic Sitting - Balance Support: Left upper extremity supported;Feet supported ?Dynamic Sitting - Eusebio Me

## 2021-04-30 NOTE — Evaluation (Signed)
Occupational Therapy Assessment and Plan ? ?Patient Details  ?Name: Aaron Dennis ?MRN: [id number] ?Date of Birth: 1955/12/27 ? ?OT Diagnosis: abnormal posture, cognitive deficits, flaccid hemiplegia and hemiparesis, hemiplegia affecting dominant side, muscle weakness (generalized), and pain in joint ?Rehab Potential: Rehab Potential (ACUTE ONLY): Good ?ELOS: 2.5-3 weeks  ? ?Today's Date: 04/30/2021 ?OT Individual Time: 2500-3704 ?OT Individual Time Calculation (min): 74 min    ? ?Hospital Problem: Principal Problem: ?  Stroke (cerebrum) ([place]) ? ? ?Past Medical History:  ?Past Medical History:  ?Diagnosis Date  ? GERD (gastroesophageal reflux disease)   ? Hypertension   ? Lung nodule   ? 7 mm LUL nodule  ? ?Past Surgical History: History reviewed. No pertinent surgical history. ? ?Assessment & Plan ?Clinical Impression: Aaron Dennis is a 66 year old male who presented to the emergency department at [facility] on 04/27/2021 with complaints of right-sided weakness he stated had been ongoing since Thursday night.  Symptoms were acute in onset and he had no mental status changes.  He was in route from [place] to [place] at the time of the symptoms.  Imaging work-up included head CT which was negative, MRI of the brain and lower extremity venous ultrasound.  The patient was seen in consultation by Dr. Leonel Ramsay.  MRI revealed an acute infarction in the left frontoparietal lobe with associated edema without mass effect.  She was well outside the window for thrombolytic therapy.  Venous ultrasound was negative for lower extremity DVT.  He was started on aspirin and Plavix therapy he will continue Plavix for 3 weeks then aspirin alone.  His Lipitor was increased from 10 mg to Crestor 20 mg.  Also noted on imaging was a 4 x 3 cerebral aneurysm and Dr. [name] discussed the findings with Dr. Estanislado Pandy.  Recommendations were to follow-up with interventional radiology as an outpatient.  CTA of head and neck  showed multifocal intracranial stenosis including the above-mentioned aneurysm.  Echocardiography revealed ejection fraction of 60 to 65%.  The patient remained hemodynamically stable. Significant left-sided weakness.  No dysarthria or dysphagia.  Heart healthy diet.  The patient requires inpatient medicine and rehabilitation evaluations and services for ongoing dysfunction secondary to left frontoparietal acute infarct. Patient transferred to CIR on 04/29/2021 .   ? ?Patient currently requires mod-max A with basic self-care skills secondary to muscle paralysis, abnormal tone, unbalanced muscle activation, decreased coordination, and decreased motor planning, decreased midline orientation, decreased attention to right, and decreased motor planning, decreased memory, and decreased sitting balance, decreased standing balance, decreased postural control, hemiplegia, and decreased balance strategies.  Prior to hospitalization, patient could complete ADLs/IADLs with independence. ? ?Patient will benefit from skilled intervention to decrease level of assist with basic self-care skills and increase independence with basic self-care skills prior to discharge home with family. Wife unable to provide physical assist but son reports ability to provide 24hr supervision/assist x1 week upon d/c. Anticipate patient will require intermittent supervision and minimal physical assistance and follow up home health vs OP pending transportation.  ? ?OT - End of Session ?Activity Tolerance: Improving ?Endurance Deficit: No ?OT Assessment ?Rehab Potential (ACUTE ONLY): Good ?OT Barriers to Discharge: Inaccessible home environment ?OT Patient demonstrates impairments in the following area(s): Balance;Cognition;Motor;Pain;Safety ?OT Basic ADL's Functional Problem(s): Grooming;Bathing;Dressing;Toileting ?OT Transfers Functional Problem(s): Toilet;Tub/Shower ?OT Additional Impairment(s): Fuctional Use of Upper Extremity ?OT Plan ?OT Intensity:  Minimum of 1-2 x/day, 45 to 90 minutes ?OT Frequency: 5 out of 7 days ?OT Duration/Estimated Length of Stay:  2.5-3 weeks ?OT Treatment/Interventions: Balance/vestibular training;Cognitive remediation/compensation;Community reintegration;Discharge planning;Disease mangement/prevention;DME/adaptive equipment instruction;Functional electrical stimulation;Functional mobility training;Neuromuscular re-education;Pain management;Patient/family education;Psychosocial support;Self Care/advanced ADL retraining;Splinting/orthotics;Therapeutic Activities;Therapeutic Exercise;UE/LE Strength taining/ROM;UE/LE Coordination activities;Wheelchair propulsion/positioning ?OT Self Feeding Anticipated Outcome(s): N/A ?OT Basic Self-Care Anticipated Outcome(s): S ?OT Toileting Anticipated Outcome(s): S ?OT Bathroom Transfers Anticipated Outcome(s): S ?OT Recommendation ?Patient destination: Home ?Follow Up Recommendations: 24 hour supervision/assistance;Other (comment) ([place] vs OP) ?Equipment Recommended: To be determined ? ? ?OT Evaluation ?Precautions/Restrictions  ?Precautions ?Precautions: Fall ?Restrictions ?Weight Bearing Restrictions: No ?General ?Chart Reviewed: Yes ?Additional Pertinent History: PMHx significant for GERD, HTN, bilateral knee OA and lung nodule. ?Family/Caregiver Present: Yes (Son present at bedside at conclusion of session) ?Vital Signs ?  ?Pain ?Pain Assessment ?Pain Scale: Faces ?Faces Pain Scale: Hurts a little bit ?Pain Type: Acute pain ?Pain Location: Shoulder ?Pain Orientation: Right ?Pain Descriptors / Indicators: Sharp ?Home Living/Prior Functioning ?Home Living ?Family/patient expects to be discharged to:: Private residence ?Living Arrangements: Spouse/significant other, Children ?Available Help at Discharge: Family, Available 24 hours/day ?Type of Home: House ?Home Access: Stairs to enter ?Entrance Stairs-Number of Steps: 3 ?Entrance Stairs-Rails: Left ?Home Layout: One level ?Bathroom Shower/Tub:  Tub/shower unit, Curtain ?Bathroom Toilet: Standard ?Additional Comments: RW-4 wheels, no seat. Just returned from [place] 4 days ago ? Lives With: Spouse, Son ?IADL History ?Occupation: Retired ?Type of Occupation: Cab driver ?Prior Function ?Level of Independence: Independent with gait, Independent with homemaking with ambulation ? Able to Take Stairs?: Yes ?Driving: Yes ?Vision ?Baseline Vision/History: 1 Wears glasses ?Ability to See in Adequate Light: 0 Adequate ?Patient Visual Report: No change from baseline ?Vision Assessment?: No apparent visual deficits ?Eye Alignment: Within Functional Limits ?Ocular Range of Motion: Within Functional Limits ?Alignment/Gaze Preference: Head tilt ?Tracking/Visual Pursuits: Decreased smoothness of eye movement to RIGHT inferior field;Decreased smoothness of eye movement to RIGHT superior field ?Convergence: Within functional limits ?Visual Fields: No apparent deficits ?Perception  ?Perception: Impaired ?Inattention/Neglect: Other (comment) ?Praxis ?Praxis: Intact ?Cognition ?Cognition ?Overall Cognitive Status: Within Functional Limits for tasks assessed ?Arousal/Alertness: Awake/alert ?Orientation Level: Person;Place;Situation ?Person: Oriented ?Place: Oriented ?Situation: Oriented ?Memory: Impaired ?Memory Impairment: Decreased short term memory ?Decreased Short Term Memory: Verbal complex ?Attention: Focused;Sustained ?Focused Attention: Appears intact ?Sustained Attention: Appears intact ?Problem Solving: Appears intact ?Reasoning: Appears intact ?Safety/Judgment: Appears intact ?Brief Interview for Mental Status (BIMS) ?Repetition of Three Words (First Attempt): 3 ?Temporal Orientation: Year: Correct ?Temporal Orientation: Month: Accurate within 5 days ?Temporal Orientation: Day: Correct ?Recall: "Sock": Yes, no cue required ?Recall: "Blue": Yes, after cueing ("a color") ?Recall: "Bed": No, could not recall ?BIMS Summary Score: 12 ?Sensation ?Sensation ?Light Touch:  Impaired Detail ?Peripheral sensation comments: Diminished RLe ?Light Touch Impaired Details: Impaired RLE ?Hot/Cold: Appears Intact ?Proprioception: Not tested ?Stereognosis: Not tested ?Coordination ?Gross Motor

## 2021-04-30 NOTE — Progress Notes (Signed)
Inpatient Rehabilitation Care Coordinator ?Assessment and Plan ?Patient Details  ?Name: Aaron Dennis ?MRN: [id number] ?Date of Birth: 05/01/1955 ? ?Today's Date: 04/30/2021 ? ?Hospital Problems: Principal Problem: ?  Stroke (cerebrum) ([place]) ? ?Past Medical History:  ?Past Medical History:  ?Diagnosis Date  ? GERD (gastroesophageal reflux disease)   ? Hypertension   ? Lung nodule   ? 7 mm LUL nodule  ? ?Past Surgical History: History reviewed. No pertinent surgical history. ?Social History:  reports that he has quit smoking. He has never used smokeless tobacco. He reports that he does not drink alcohol and does not use drugs. ? ?Family / Support Systems ?Marital Status: Married ?Patient Roles: Spouse, Parent, Other (Comment) ?Spouse/Significant Other: Wife ?ChildrenZenaida Deed [phone] ?Other Supports: Two other son's ?Anticipated Caregiver: Wife and son's ?Ability/Limitations of Caregiver: Wife and two of his son's live with them and can assist-son's work ?Caregiver Availability: 24/7 ?Family Dynamics: Close with three son's all are local and one is married and does not live with them. Pt has much pride with his boys and how successful they are ? ?Social History ?Preferred language: English ?Religion: Muslim ?Cultural Background: Speaks arabic-main language but also speaks English Pt feels no interpreter is needed while here ?Education[place] ?Health Literacy - How often do you need to have someone help you when you read instructions, pamphlets, or other written material from your doctor or pharmacy?: Sometimes ?Writes: Yes ?Employment Status: Retired ?Legal History/Current Legal Issues: No issues ?Guardian/Conservator: None-according to MD pt is capable of making his own decisions while here  ? ?Abuse/Neglect ?Abuse/Neglect Assessment Can Be Completed: Yes ?Physical Abuse: Denies ?Verbal Abuse: Denies ?Sexual Abuse: Denies ?Exploitation of patient/patient's resources: Denies ?Self-Neglect:  Denies ? ?Patient response to: ?Social Isolation - How often do you feel lonely or isolated from those around you?: Never ? ?Emotional Status ?Pt's affect, behavior and adjustment status: Pt is motivated to improve while here, his stroke actually happened while he was traveling in [place] and upon return to the [place] his symptoms began again. He has always been independent and plans on being again. ?Recent Psychosocial Issues: other health issues were managed until this happened ?Psychiatric History: No history may benefit from seeing neuro-psych while here for coping ?Substance Abuse History: No issues ? ?Patient / Family Perceptions, Expectations & Goals ?Pt/Family understanding of illness & functional limitations: Pt and son who is present can explain his stroke and deficits, both have spoken with the MD and feel understand his treatment plan moving foreward. pt is really wanitng to do well and not have to rely upon others for assist ?Premorbid pt/family roles/activities: Father, husband, retiree, friend, etc ?Anticipated changes in roles/activities/participation: resume ?Pt/family expectations/goals: Pt states: " I plan to be able to do for myself when I leave here. "  Son states: " We will do whatever is needed as would he for [place]." ? ?Community Resources ?Community Agencies: None ?Premorbid Home Care/DME Agencies: None ?Transportation available at discharge: Family ?Is the patient able to respond to transportation needs?: Yes ?In the past 12 months, has lack of transportation kept you from medical appointments or from getting medications?: No ?In the past 12 months, has lack of transportation kept you from meetings, work, or from getting things needed for daily living?: No ?Resource referrals recommended: Neuropsychology ? ?Discharge Planning ?Living Arrangements: Spouse/significant other, Children ?Support Systems: Spouse/significant other, Children, Other relatives, Friends/neighbors ?Type of Residence: Private  residence ?Insurance Resources: [employer] (specify county), [employer]imedia [employer] (specify) ([employer]-Medicare) ?Financial Resources: Social  Security, Family Support ?Financial Screen Referred: No ?Living Expenses: Rent ?Money Management: Patient ?Does the patient have any problems obtaining your medications?: No ?Home Management: wife ?Patient/Family Preliminary Plans: Return home with wife and son's to assist him. He is being evaluated today and goals being set for his stay here. He is hopeful he will surpass their goals. ?Care Coordinator Barriers to Discharge: Insurance for SNF coverage ?Care Coordinator Anticipated Follow Up Needs: HH/OP ? ?Clinical Impression ?Very motivated gentleman at times it is difficult to understand him due to his accent. His son was present and very supportive, he will have 24/7 care at discharge between his wife and son's. Will await team evaluations and work on discharge needs. ? ?[name] ?04/30/2021, 2:34 PM ? ?  ?

## 2021-04-30 NOTE — Progress Notes (Signed)
[facility]patient [facility] ?Individual Statement of Services ? ?Patient Name:  Aaron Dennis  ?Date:  04/30/2021 ? ?Welcome to the [facility].  Our goal is to provide you with an individualized program based on your diagnosis and situation, designed to meet your specific needs.  With this comprehensive rehabilitation program, you will be expected to participate in at least 3 hours of rehabilitation therapies Monday-Friday, with modified therapy programming on the weekends. ? ?Your rehabilitation program will include the following services:  Physical Therapy (PT), Occupational Therapy (OT), 24 hour per day rehabilitation nursing, Therapeutic Recreaction (TR), Neuropsychology, Care Coordinator, Rehabilitation Medicine, Nutrition Services, and Pharmacy Services ? ?Weekly team conferences will be held on Tuesday to discuss your progress.  Your Inpatient Rehabilitation Care Coordinator will talk with you frequently to get your input and to update you on team discussions.  Team conferences with you and your family in attendance may also be held. ? ?Expected length of stay: 2.5-3 weeks  Overall anticipated outcome: supervision-CGA level of assist ? ?Depending on your progress and recovery, your program may change. Your Inpatient Rehabilitation Care Coordinator will coordinate services and will keep you informed of any changes. Your Inpatient Rehabilitation Care Coordinator's name and contact numbers are listed  below. ? ?The following services may also be recommended but are not provided by the [facility]:  ?Driving Evaluations ?Home Health Rehabiltiation Services ?Outpatient Rehabilitation Services ?Vocational Rehabilitation ?  ?Arrangements will be made to provide these services after discharge if needed.  Arrangements include referral to agencies that provide these services. ? ?Your insurance has been verified to be:  [employer]-Medicare & medicaid ?Your primary doctor is:  Enobong  Newlin ? ?Pertinent information will be shared with your doctor and your insurance company. ? ?Inpatient Rehabilitation Care Coordinator:  Ovidio Kin, [place] or (C) [phone] ? ?Information discussed with and copy given to patient by: [name], 04/30/2021, 10:43 AM    ?

## 2021-05-01 DIAGNOSIS — I639 Cerebral infarction, unspecified: Secondary | ICD-10-CM

## 2021-05-01 HISTORY — DX: Cerebral infarction, unspecified: I63.9

## 2021-05-01 LAB — BASIC METABOLIC PANEL
Anion gap: 6 (ref 5–15)
BUN: 15 mg/dL (ref 8–23)
CO2: 24 mmol/L (ref 22–32)
Calcium: 8.8 mg/dL — ABNORMAL LOW (ref 8.9–10.3)
Chloride: 108 mmol/L (ref 98–111)
Creatinine, Ser: 0.99 mg/dL (ref 0.61–1.24)
GFR, Estimated: >60 mL/min (ref >60)
Glucose, Bld: 87 mg/dL (ref 70–99)
Potassium: 3.7 mmol/L (ref 3.5–5.1)
Sodium: 138 mmol/L (ref 135–145)

## 2021-05-01 MED ORDER — CITALOPRAM HYDROBROMIDE 20 MG PO TABS
20.0000 mg | ORAL_TABLET | Freq: Every day | ORAL | Status: DC
  Administered 2021-05-01 – 2021-05-20 (×20): 20 mg via ORAL
  Filled 2021-05-01 (×20): qty 1

## 2021-05-01 NOTE — Progress Notes (Signed)
?                                                       PROGRESS NOTE ? ? ?Subjective/Complaints: ? ?LBM yesterday- large.  ?Doesn't feel good today- per son thinks it's "mental"- When asked further, pt admits to feeling depressed over stroke.  ? ?Also c/o R shoulder pain ?Hasn't eaten yet this AM.  ?Son at bedside.   ? ?ROS: ? ?Pt denies SOB, abd pain, CP, N/V/C/D, and vision changes ? ? ? ? ?Objective: ?  ?No results found. ?Recent Labs  ?  04/29/21 ?0409 04/30/21 ?1884  ?WBC 6.6 6.5  ?HGB 15.6 16.1  ?HCT 46.3 49.1  ?PLT 222 213  ? ?Recent Labs  ?  04/30/21 ?1660 05/01/21 ?6301  ?NA 139 138  ?K 3.4* 3.7  ?CL 105 108  ?CO2 27 24  ?GLUCOSE 89 87  ?BUN 14 15  ?CREATININE 0.94 0.99  ?CALCIUM 8.8* 8.8*  ? ? ?Intake/Output Summary (Last 24 hours) at 05/01/2021 0846 ?Last data filed at 05/01/2021 0500 ?Gross per 24 hour  ?Intake 360 ml  ?Output 1250 ml  ?Net -890 ml  ?  ? ?  ? ?Physical Exam: ?Vital Signs ?Blood pressure 132/83, pulse 74, temperature 98.2 ?F (36.8 ?C), temperature source Oral, resp. rate 14, height '5\' 10"'$  (1.778 m), weight 76.5 kg, SpO2 99 %. ? ? ? ?General: awake, alert, appropriate, sitting up in bed; hasn't eaten or touched tray; son at bedside; NAD ?HENT: conjugate gaze; oropharynx moist ?CV: regular rate; no JVD ?Pulmonary: CTA B/L; no W/R/R- good air movement ?GI: soft, NT, ND, (+)BS; normoactive ?Psychiatric: appropriate but depressed/flat affect ?Neurological: Ox3-  ?   Cervical back: Neck supple. No tenderness.  ?   Comments: LUE/LLE 5/5 ?RUE- 0/5 ?RLE- HF and KE/KF 2-/5; DF 2+/5; PF 2-/5 ?  ?Skin: ?   General: Skin is warm and dry.  ?   Comments: B/L forearm IV- L AC fossa IV looks  little red- but RUE looks OK ? MS: R shoulder- has 1 finger subluxation- also TTP over biceps/deltoid and less so anterior shoulder/posterior shoulder- cannot test empty can test due to weakness, nor biceps flexion resisted.  ?Neurological:  ?   Mental Status: He is alert and oriented to person, place, and time.  ?    Comments: Speech is at baseline per pt= per son Arabic, primary language is good ?Intact to light touch x 4 extremities  ? ? ?Assessment/Plan: ?1. Functional deficits which require 3+ hours per day of interdisciplinary therapy in a comprehensive inpatient rehab setting. ?Physiatrist is providing close team supervision and 24 hour management of active medical problems listed below. ?Physiatrist and rehab team continue to assess barriers to discharge/monitor patient progress toward functional and medical goals ? ?Care Tool: ? ?Bathing ? Bathing activity did not occur: Safety/medical concerns ?Body parts bathed by patient: Right arm, Chest, Front perineal area, Abdomen, Right upper leg, Left upper leg, Face  ? Body parts bathed by helper: Left arm, Buttocks, Left lower leg, Right lower leg ?  ?  ?Bathing assist Assist Level: Moderate Assistance - Patient 50 - 74% ?  ?  ?Upper Body Dressing/Undressing ?Upper body dressing Upper body dressing/undressing activity did not occur (including orthotics): Safety/medical concerns ?What is the patient wearing?: Pull over shirt ?   ?Upper  body assist Assist Level: Moderate Assistance - Patient 50 - 74% ?   ?Lower Body Dressing/Undressing ?Lower body dressing ? ? ? Lower body dressing activity did not occur: Safety/medical concerns ?What is the patient wearing?: Pants, Underwear/pull up ? ?  ? ?Lower body assist Assist for lower body dressing: Maximal Assistance - Patient 25 - 49% ?   ? ?Toileting ?Toileting Toileting Activity did not occur (Probation officer and hygiene only): N/A (no void or bm)  ?Toileting assist   ?  ?  ?Transfers ?Chair/bed transfer ? ?Transfers assist ? Chair/bed transfer activity did not occur: Safety/medical concerns ? ?Chair/bed transfer assist level: Moderate Assistance - Patient 50 - 74% ?  ?  ?Locomotion ?Ambulation ? ? ?Ambulation assist ? ? Ambulation activity did not occur: Safety/medical concerns ? ?  ?  ?   ? ?Walk 10 feet activity ? ? ?Assist ?  Walk 10 feet activity did not occur: Safety/medical concerns ? ?  ?   ? ?Walk 50 feet activity ? ? ?Assist Walk 50 feet with 2 turns activity did not occur: Safety/medical concerns ? ?  ?   ? ? ?Walk 150 feet activity ? ? ?Assist Walk 150 feet activity did not occur: Safety/medical concerns ? ?  ?  ?  ? ?Walk 10 feet on uneven surface  ?activity ? ? ?Assist Walk 10 feet on uneven surfaces activity did not occur: Safety/medical concerns ? ? ?  ?   ? ?Wheelchair ? ? ? ? ?Assist Is the patient using a wheelchair?: Yes (transport to/from gym) ?Type of Wheelchair: Manual ?  ? ?Wheelchair assist level: Dependent - Patient 0% ?   ? ? ?Wheelchair 50 feet with 2 turns activity ? ? ? ?Assist ? ?  ?  ? ? ?   ? ?Wheelchair 150 feet activity  ? ? ? ?Assist ?   ? ? ?   ? ?Blood pressure 132/83, pulse 74, temperature 98.2 ?F (36.8 ?C), temperature source Oral, resp. rate 14, height '5\' 10"'$  (1.778 m), weight 76.5 kg, SpO2 99 %. ? ?Medical Problem List and Plan: ?1. Functional deficits secondary to acute left frontoparietal infarct ?            -patient may  shower ?            -ELOS/Goals: 7-10 days supervision ? Con't CIR- PT, OT and SLP- will see if OT can Ktape R shoulder to help with pain.  ?2.  Antithrombotics: ?-DVT/anticoagulation:  Pharmaceutical: Lovenox ?            -antiplatelet therapy: Plavix and aspirin for 3 weeks then aspirin alone ?3. Pain Management: Tylenol ?4. Mood: LCSW to evaluate and provide emotional support ? 3/23- pt c/o feeling depressed over stroke- will start Celexa 20 mg daily.  ?            -antipsychotic agents: Not applicable ?5. Neuropsych: This patient is capable of making decisions on his own behalf. ?6. Skin/Wound Care: Routine skin care checks ? 3/22- will remove IV"s.  ?7. Fluids/Electrolytes/Nutrition: Routine I's and O's and follow-up chemistries ?8: Cerebral aneurysm: follow-up IR as outpatient ?9: Hyperlipidemia:: continue Crestor 20 mg daily ?10: Essential hypertension: Continue amlodipine  5 mg daily ?-- Proscar 5 mg daily ?3/23- BP well controlled- con't regimen ?11: Polycythemia: Follow-up medical oncology as outpatient ?12: GI prophylaxis: Continue Protonix ?13: BPH: Continue Flomax ? 3/22- emptying except ~ 100cc- will con't bladder scans for now ?14. Constipation- LBM 3 days ago- will give miralax  prn ?3/22- will give miralax x1 this AM- if doesn't work, will give sorbitol tomorrow  ?3/23- LBM yesterday ? 15/ hypokalemia ? 3/22- will replete 40 mEq x1 and recheck  ?3/23- K+ up to 3.7- con't to monitor- will recheck Monday ?16. Dispo3/22- 1 family member can stay overnight.  ? ?I spent a total of 40   minutes on total care today- >50% coordination of care- due to  d/w pt about depression and therapy about k taping of R shoulder.  ? ? ? ?LOS: ?2 days ?A FACE TO FACE EVALUATION WAS PERFORMED ? ?Rena Sweeden ?05/01/2021, 8:46 AM  ? ? ? ?

## 2021-05-01 NOTE — Progress Notes (Addendum)
Occupational Therapy Session Note ? ?Patient Details  ?Name: Aaron Dennis ?MRN: [id number] ?Date of Birth: [birth year] ? ?Today's Date: 05/01/2021 ?OT Individual Time: 2415-5161 ?OT Individual Time Calculation (min): 78 min  ? ? ?Short Term Goals: ?Week 1:  OT Short Term Goal 1 (Week 1): Patient will don UB clothing with good recall of hemi technique and Min A with no more than 2 verbal cues. ?OT Short Term Goal 2 (Week 1): Patient complete 1/3 parts of toileting task with Mod A and LRAD. ?OT Short Term Goal 3 (Week 1): Patient will don LB clothing with hemi technique and Mod A with LRAD. ?OT Short Term Goal 4 (Week 1): Patient will complete squat-pivot to [facility] with Min A. ? ?Skilled Therapeutic Interventions/Progress Updates:  ?Patient met seated in wc in agreement with OT treatment session. Per MD patient with pain in hemiparetic R shoulder. K-tape to maintain integrity of shoulder joint and decrease pain. Initiated wc mobility with hemi-technique and Mod A with Max mutlimodal cues. Retrieved R lap tray. Squat-pivot to mat table with Mod A. Focus shifted to scapular mobility in sitting. NMES applied to middle deltoid and supraspinatus as detailed below. Education provided on self-ROM of hemiparetic RUE with patient completing several exercises with cues for pacing/form. Total A for wc transport to room for time management. Session concluded with patient seated in wc with call bell within reach, belt alarm activated and all needs met.  ? ?1:1 NMES applied to supraspinatus and middle deltoid to help approximate shoulder joint to reduce sublux and reduce pain. NMES then applied to wrist extensors. Skin intact during and after treatment.  ? ?Ratio 1:3 ?Rate 35 pps ?Waveform- Asymmetric ?Ramp 1.0 ?Pulse 300 ?Intensity- 21 ?Duration -   10 min ? ?Therapy Documentation ?Precautions:  ?Precautions ?Precautions: Fall ?Restrictions ?Weight Bearing Restrictions: No ?General: ?  ? ?Therapy/Group: Individual Therapy ? ?Zohal Reny R  [place] ?05/01/2021, 12:09 PM ?

## 2021-05-01 NOTE — Progress Notes (Signed)
Physical Therapy Session Note ? ?Patient Details  ?Name: Aaron Dennis ?MRN: [id number] ?Date of Birth: 11/24/1955 ? ?Today's Date: 05/01/2021 ?PT Missed Time: 60 Minutes ?Missed Time Reason: Pain;Patient unwilling to participate ? ?Short Term Goals: ?Week 1:  PT Short Term Goal 1 (Week 1): Pt will initate gait training ?PT Short Term Goal 2 (Week 1): pt will propel w/c with min A or better ?PT Short Term Goal 3 (Week 1): Pt will perform squat pivot with min A or better ? ?Skilled Therapeutic Interventions/Progress Updates:  ?  Patient received supine in bed, irritated by PT presence. He states "I told them I have a headache- I shouldn't have to work today." PT attempting to offer non-pharmacological pain interventions and offering repositioning to assist with pain management. Patient declining and stating that he received tylenol this AM and that it "didn't even touch it." Patient declining to participate in therapy at this time. RN aware of patients headache.  ? ?Therapy Documentation ?Precautions:  ?Precautions ?Precautions: Fall ?Restrictions ?Weight Bearing Restrictions: No ? ? ? ?Therapy/Group: Individual Therapy ? ?Debbora Dus ?Debbora Dus, PT, DPT, CBIS ? ?05/01/2021, 7:39 AM  ?

## 2021-05-01 NOTE — Progress Notes (Signed)
Physical Therapy Session Note ? ?Patient Details  ?Name: Aaron Dennis ?MRN: [id number] ?Date of Birth: [birth year] ? ?Today's Date: 05/01/2021 ?PT Individual Time: 9371-6967 ?PT Individual Time Calculation (min): 45 min  ? ?Short Term Goals: ?Week 1:  PT Short Term Goal 1 (Week 1): Pt will initate gait training ?PT Short Term Goal 2 (Week 1): pt will propel w/c with min A or better ?PT Short Term Goal 3 (Week 1): Pt will perform squat pivot with min A or better ?Week 2:    ?Week 3:    ? ?Skilled Therapeutic Interventions/Progress Updates:  ?Pt c/o headach but states he received tylenol, no number given, treatment to tolerance, return to bed following session for rest. ? ? Supine to sit w/heavy mod assist, max cues for sequencing, positioning of RUE, initially mod assist static sit w/post lean, fades to cga over several min, min assist w/scooting to edge.  Mildly impulsive w/this. ? ?Sit to stand w/mod to max assist to hemiwalker.  In standing worked on R quad sets attmpting to isolate quat from hip retraction w/acitvation. ? ?stand pivot transfer to wc w/mod assist w/hemiwalker. ? ?DF assist acewrap applied to RLE by therapist ?Gait at hallway rail: ?36f x 2 w/light mod assist, pt able to advance RLE w/inconsistent step length, increased lateral trunkl lean L w/advancement R, approx 20* knee flexion at initial contact maintainted thru stance, tactile cues to prevent buckling, step to gait but progressed to step thru w/cues - initially excessive step length L resulting in buckling R w/mod assist required but corrects w/instruction. ? ?Wc to bed w/mod assist stand pivot/HHA.  Sit to supine w/mod assist. ?Pt left supine w/rails up x 3, alarm set, bed in lowest position, and needs in reach. ? ? ? ? ?Therapy Documentation ?Precautions:  ?Precautions ?Precautions: Fall ?Restrictions ?Weight Bearing Restrictions: No ? ? ?Therapy/Group: Individual Therapy ?BCallie Fielding PT ? ? ?BJerrilyn Cairo?05/01/2021, 10:33 AM  ?

## 2021-05-02 NOTE — Progress Notes (Signed)
?                                                       PROGRESS NOTE ? ? ?Subjective/Complaints: ? ?Shoulder pain MUCH better with estim and k-taping yesterday.  ? ?Pt really happy with OT's treatment.    ? ?ROS: ? ?Pt denies SOB, abd pain, CP, N/V/C/D, and vision changes ? ? ? ?Objective: ?  ?No results found. ?Recent Labs  ?  04/30/21 ?6237  ?WBC 6.5  ?HGB 16.1  ?HCT 49.1  ?PLT 213  ? ?Recent Labs  ?  04/30/21 ?6283 05/01/21 ?1517  ?NA 139 138  ?K 3.4* 3.7  ?CL 105 108  ?CO2 27 24  ?GLUCOSE 89 87  ?BUN 14 15  ?CREATININE 0.94 0.99  ?CALCIUM 8.8* 8.8*  ? ? ?Intake/Output Summary (Last 24 hours) at 05/02/2021 0828 ?Last data filed at 05/01/2021 1355 ?Gross per 24 hour  ?Intake 120 ml  ?Output --  ?Net 120 ml  ?  ? ?  ? ?Physical Exam: ?Vital Signs ?Blood pressure 125/82, pulse 74, temperature 97.8 ?F (36.6 ?C), resp. rate 15, height '5\' 10"'$  (1.778 m), weight 76.5 kg, SpO2 99 %. ? ? ? ?General: awake, alert, appropriate, in w/c going to gym with OT;  NAD ?HENT: conjugate gaze; oropharynx moist ?CV: regular rate; no JVD ?Pulmonary: CTA B/L; no W/R/R- good air movement ?GI: soft, NT, ND, (+)BS ?Psychiatric: appropriate- much brighter affect today ?Neurological: Ox3 ?   Cervical back: Neck supple. No tenderness.  ?   Comments: LUE/LLE 5/5 ?RUE- 0/5 ?RLE- HF and KE/KF 2-/5; DF 2+/5; PF 2-/5 ?  ?Skin: ?   General: Skin is warm and dry.  ?   Comments: B/L forearm IV- L AC fossa IV looks  little red- but RUE looks OK ? MS: R shoulder - no subluxation felt with k tape in place today- much less TTP anterior and posterior this AM ?Neurological:  ?   Mental Status: He is alert and oriented to person, place, and time.  ?   Comments: Speech is at baseline per pt= per son Arabic, primary language is good ?Intact to light touch x 4 extremities  ? ? ?Assessment/Plan: ?1. Functional deficits which require 3+ hours per day of interdisciplinary therapy in a comprehensive inpatient rehab setting. ?Physiatrist is providing close team  supervision and 24 hour management of active medical problems listed below. ?Physiatrist and rehab team continue to assess barriers to discharge/monitor patient progress toward functional and medical goals ? ?Care Tool: ? ?Bathing ? Bathing activity did not occur: Safety/medical concerns ?Body parts bathed by patient: Right arm, Chest, Front perineal area, Abdomen, Right upper leg, Left upper leg, Face  ? Body parts bathed by helper: Left arm, Buttocks, Left lower leg, Right lower leg ?  ?  ?Bathing assist Assist Level: Moderate Assistance - Patient 50 - 74% ?  ?  ?Upper Body Dressing/Undressing ?Upper body dressing Upper body dressing/undressing activity did not occur (including orthotics): Safety/medical concerns ?What is the patient wearing?: Pull over shirt ?   ?Upper body assist Assist Level: Moderate Assistance - Patient 50 - 74% ?   ?Lower Body Dressing/Undressing ?Lower body dressing ? ? ? Lower body dressing activity did not occur: Safety/medical concerns ?What is the patient wearing?: Pants, Underwear/pull up ? ?  ? ?Lower body  assist Assist for lower body dressing: Maximal Assistance - Patient 25 - 49% ?   ? ?Toileting ?Toileting Toileting Activity did not occur (Probation officer and hygiene only): N/A (no void or bm)  ?Toileting assist   ?  ?  ?Transfers ?Chair/bed transfer ? ?Transfers assist ? Chair/bed transfer activity did not occur: Safety/medical concerns ? ?Chair/bed transfer assist level: Moderate Assistance - Patient 50 - 74% ?  ?  ?Locomotion ?Ambulation ? ? ?Ambulation assist ? ? Ambulation activity did not occur: Safety/medical concerns ? ?  ?  ?   ? ?Walk 10 feet activity ? ? ?Assist ? Walk 10 feet activity did not occur: Safety/medical concerns ? ?  ?   ? ?Walk 50 feet activity ? ? ?Assist Walk 50 feet with 2 turns activity did not occur: Safety/medical concerns ? ?  ?   ? ? ?Walk 150 feet activity ? ? ?Assist Walk 150 feet activity did not occur: Safety/medical concerns ? ?  ?  ?   ? ?Walk 10 feet on uneven surface  ?activity ? ? ?Assist Walk 10 feet on uneven surfaces activity did not occur: Safety/medical concerns ? ? ?  ?   ? ?Wheelchair ? ? ? ? ?Assist Is the patient using a wheelchair?: Yes (transport to/from gym) ?Type of Wheelchair: Manual ?  ? ?Wheelchair assist level: Dependent - Patient 0% ?   ? ? ?Wheelchair 50 feet with 2 turns activity ? ? ? ?Assist ? ?  ?  ? ? ?   ? ?Wheelchair 150 feet activity  ? ? ? ?Assist ?   ? ? ?   ? ?Blood pressure 125/82, pulse 74, temperature 97.8 ?F (36.6 ?C), resp. rate 15, height '5\' 10"'$  (1.778 m), weight 76.5 kg, SpO2 99 %. ? ?Medical Problem List and Plan: ?1. Functional deficits secondary to acute left frontoparietal infarct ?            -patient may  shower ?            -ELOS/Goals: 7-10 days supervision ? Con't CIR_ PT and OT- K taping helping sublux and shoulder pain ?2.  Antithrombotics: ?-DVT/anticoagulation:  Pharmaceutical: Lovenox ?            -antiplatelet therapy: Plavix and aspirin for 3 weeks then aspirin alone ?3. Pain Management: Tylenol ?4. Mood: LCSW to evaluate and provide emotional support ? 3/23- pt c/o feeling depressed over stroke- will start Celexa 20 mg daily. ?3/24- brighter affect with less pain today- con't Celexa.   ?            -antipsychotic agents: Not applicable ?5. Neuropsych: This patient is capable of making decisions on his own behalf. ?6. Skin/Wound Care: Routine skin care checks ? 3/22- will remove IV"s.  ?7. Fluids/Electrolytes/Nutrition: Routine I's and O's and follow-up chemistries ?8: Cerebral aneurysm: follow-up IR as outpatient ?9: Hyperlipidemia:: continue Crestor 20 mg daily ?10: Essential hypertension: Continue amlodipine 5 mg daily ?-- Proscar 5 mg daily ?3/23- BP well controlled- con't regimen ?11: Polycythemia: Follow-up medical oncology as outpatient ?12: GI prophylaxis: Continue Protonix ?13: BPH: Continue Flomax ? 3/24- peeing well- con't flomax ?14. Constipation- LBM 3 days ago- will give  miralax prn ?3/22- will give miralax x1 this AM- if doesn't work, will give sorbitol tomorrow  ?3/24- LBM overnight- con't regimen ? 15/ hypokalemia ? 3/22- will replete 40 mEq x1 and recheck  ?3/23- K+ up to 3.7- con't to monitor- will recheck Monday ?16. Dispo3/22- 1 family member can stay  overnight.  ? ? ?I spent a total of  36  minutes on total care today- >50% coordination of care- due to d/w OT about R shoulder and progress as well as IPOC.  ?  ? ? ? ?LOS: ?3 days ?A FACE TO FACE EVALUATION WAS PERFORMED ? ?Mckena Chern ?05/02/2021, 8:28 AM  ? ? ? ?

## 2021-05-02 NOTE — Progress Notes (Signed)
Occupational Therapy Session Note ? ?Patient Details  ?Name: Aaron Dennis ?MRN: [id number] ?Date of Birth: [birth year] ? ?Today's Date: 05/02/2021 ?OT Individual Time: 1035-1100 ?OT Individual Time Calculation (min): 25 min  ? ? ?Short Term Goals: ?Week 1:  OT Short Term Goal 1 (Week 1): Patient will don UB clothing with good recall of hemi technique and Min A with no more than 2 verbal cues. ?OT Short Term Goal 2 (Week 1): Patient complete 1/3 parts of toileting task with Mod A and LRAD. ?OT Short Term Goal 3 (Week 1): Patient will don LB clothing with hemi technique and Mod A with LRAD. ?OT Short Term Goal 4 (Week 1): Patient will complete squat-pivot to [facility] with Min A. ? ?Skilled Therapeutic Interventions/Progress Updates:  ?Skilled OT intervention completed with focus on self-ROM education. Pt received seated in w/c, with son present who completed intermittent translation for communication during therapy as needed. Pt had been provided an HEP for self-ROM, with previous OT requesting this OT to teach pt the exercises for accuracy of completion. Pt completed the following self-ROM exercises with multimodal cues and stabilization at R elbow needed for proper form throughout: ? ?(X10 each) ?Shoulder flexion/forward slides ?Shoulder horizontal abduction/adduction/washing table ?Cradling for shoulder adduction/abduction ?Shoulder shrugs ?Forearm raises/cradle method ? ?Pt required safety education for shoulder integrity to prevent pulling on the arm, or lifting above shoulder height. Provided cues for self-cradling/stabilizing his R elbow vs therapist. Instructed pt's son to only complete 1st page of handout with pt as therapist only able to make it through first page vs doing other exercises also since pt needed safety/form cues. Pt was left seated in w/c, with son present, belt alarm on and all needs in reach at end of session. ? ? ?Therapy Documentation ?Precautions:  ?Precautions ?Precautions:  Fall ?Restrictions ?Weight Bearing Restrictions: No ? ?Pain: ?No c/o pain ? ? ?Therapy/Group: Individual Therapy ? ?Johnjoseph Rolfe E Tennille Montelongo ?05/02/2021, 7:34 AM ?

## 2021-05-02 NOTE — Plan of Care (Signed)
?  Problem: Consults ?Goal: RH STROKE PATIENT EDUCATION ?Description: See Patient Education module for education specifics  ?Outcome: Progressing ?  ?Problem: RH BOWEL ELIMINATION ?Goal: RH STG MANAGE BOWEL WITH ASSISTANCE ?Description: STG Manage Bowel with Supervision Assistance. ?Outcome: Progressing ?Goal: RH STG MANAGE BOWEL W/MEDICATION W/ASSISTANCE ?Description: STG Manage Bowel with Medication with Supervision Assistance. ?Outcome: Progressing ?  ?Problem: RH SAFETY ?Goal: RH STG ADHERE TO SAFETY PRECAUTIONS W/ASSISTANCE/DEVICE ?Description: STG Adhere to Safety Precautions With Cues and Reminders. ?Outcome: Progressing ?Goal: RH STG DECREASED RISK OF FALL WITH ASSISTANCE ?Description: STG Decreased Risk of Fall With Supervision Assistance. ?Outcome: Progressing ?  ?Problem: RH COGNITION-NURSING ?Goal: RH STG USES MEMORY AIDS/STRATEGIES W/ASSIST TO PROBLEM SOLVE ?Description: STG Uses Memory Aids/Strategies With Supervision Assistance to Problem Solve. ?Outcome: Progressing ?Goal: RH STG ANTICIPATES NEEDS/CALLS FOR ASSIST W/ASSIST/CUES ?Description: STG Anticipates Needs/Calls for Assist With Cues and Reminders. ?Outcome: Progressing ?  ?Problem: RH KNOWLEDGE DEFICIT ?Goal: RH STG INCREASE KNOWLEDGE OF HYPERTENSION ?Description: Patient will demonstrate knowledge of HTN medications, dietary restrictions, and BP parameters with educational materials and handouts provided by staff independently at discharge. ?Outcome: Progressing ?Goal: RH STG INCREASE KNOWLEDGE OF STROKE PROPHYLAXIS ?Description: Patient will demonstrate knowledge of medications used to prevent future strokes with educational materials and handouts provided by staff independently at discharge. ?Outcome: Progressing ?  ?

## 2021-05-02 NOTE — IPOC Note (Signed)
Overall Plan of Care (IPOC) ?Patient Details ?Name: Aaron Dennis ?MRN: [id number] ?DOB: Dec 28, 1955 ? ?Admitting Diagnosis: Stroke (cerebrum) ([place]) ? ?Hospital Problems: Principal Problem: ?  Stroke (cerebrum) ([place]) ? ? ? ? Functional Problem List: ?Nursing Bowel, Endurance, Motor, Safety, Perception  ?PT Balance, Perception, Edema, Safety, Motor  ?OT Balance, Cognition, Motor, Pain, Safety  ?SLP    ?TR    ?    ? Basic ADL?s: ?OT Grooming, Bathing, Dressing, Toileting  ? ?  Advanced  ADL?s: ?OT    ?   ?Transfers: ?PT Bed Mobility, Bed to Chair, Car, Furniture  ?OT Toilet, Tub/Shower  ? ?  Locomotion: ?PT Ambulation, Wheelchair Mobility, Stairs  ? ?  Additional Impairments: ?OT Fuctional Use of Upper Extremity  ?SLP   ?  ?   ?TR    ? ? ?Anticipated Outcomes ?Item Anticipated Outcome  ?Self Feeding N/A  ?Swallowing ?   ?  ?Basic self-care ? S  ?Toileting ? S ?  ?Bathroom Transfers S  ?Bowel/Bladder ? supervision  ?Transfers ? supervision  ?Locomotion ? mod I w/c, CGA short distance gait  ?Communication ?    ?Cognition ?    ?Pain ? n/a  ?Safety/Judgment ? supervision  ? ?Therapy Plan: ?PT Intensity: Minimum of 1-2 x/day ,45 to 90 minutes ?PT Frequency: 5 out of 7 days ?PT Duration Estimated Length of Stay: ~3 weeks ?OT Intensity: Minimum of 1-2 x/day, 45 to 90 minutes ?OT Frequency: 5 out of 7 days ?OT Duration/Estimated Length of Stay: 2.5-3 weeks ?   ? ?Due to the current state of emergency, patients may not be receiving their 3-hours of Medicare-mandated therapy. ? ? Team Interventions: ?Nursing Interventions Patient/Family Education, Bowel Management, Disease Management/Prevention, Medication Management, Discharge Planning  ?PT interventions Ambulation/gait training, Cognitive remediation/compensation, Discharge planning, DME/adaptive equipment instruction, Functional mobility training, Pain management, Psychosocial support, Splinting/orthotics, Therapeutic Activities, UE/LE Strength taining/ROM, Visual/perceptual  remediation/compensation, Wheelchair propulsion/positioning, UE/LE Coordination activities, Therapeutic Exercise, Stair training, Skin care/wound management, Patient/family education, Neuromuscular re-education, Functional electrical stimulation, Disease management/prevention, Academic librarian, Training and development officer  ?OT Interventions Balance/vestibular training, Cognitive remediation/compensation, Community reintegration, Discharge planning, Disease mangement/prevention, DME/adaptive equipment instruction, Functional electrical stimulation, Functional mobility training, Neuromuscular re-education, Pain management, Patient/family education, Psychosocial support, Self Care/advanced ADL retraining, Splinting/orthotics, Therapeutic Activities, Therapeutic Exercise, UE/LE Strength taining/ROM, UE/LE Coordination activities, Wheelchair propulsion/positioning  ?SLP Interventions    ?TR Interventions    ?SW/CM Interventions Discharge Planning, Psychosocial Support, Patient/Family Education  ? ?Barriers to Discharge ?MD  Medical stability, Home enviroment access/loayout, and Weight bearing restrictions  ?Nursing Decreased caregiver support, Home environment access/layout ?1 level, 3 steps, left rail. Spouse and 2 sons can provide 24/7 min/mod assist.  ?PT Inaccessible home environment, Decreased caregiver support ?   ?OT Inaccessible home environment ?   ?SLP   ?   ?SW Insurance for SNF coverage ?   ? ?Team Discharge Planning: ?Destination: PT-Home ,OT- Home , SLP-  ?Projected Follow-up: PT-Home health PT, Outpatient PT (TBD OPPT pending transportation), OT-  24 hour supervision/assistance, Other (comment) (HH vs OP), SLP-  ?Projected Equipment Needs: PT-To be determined, OT- To be determined, SLP-  ?Equipment Details: PT-likey hemiwalker, w/c TBD, OT-  ?Patient/family involved in discharge planning: PT- Patient, Family member/caregiver,  OT-Patient, Family member/caregiver, SLP-  ? ?MD ELOS: 2.5-3  weeks ?Medical Rehab Prognosis:  Good ?Assessment:  ?The patient has been admitted for CIR therapies with the diagnosis of L frontoparietal infarct. The team will be addressing functional mobility, strength, stamina, balance, safety, adaptive techniques and equipment,  self-care, bowel and bladder mgt, patient and caregiver education, Ktaping,  estim. Goals have been set at supervision. Anticipated discharge destination is home with family. ? ?Due to the current state of emergency, patients may not be receiving their 3 hours per day of Medicare-mandated therapy.  ? ? ? ?  ? ? ?See Team Conference Notes for weekly updates to the plan of care ? ?

## 2021-05-02 NOTE — Progress Notes (Signed)
Occupational Therapy Session Note ? ?Patient Details  ?Name: Aaron Dennis ?MRN: [id number] ?Date of Birth: 06/22/1955 ? ?Today's Date: 05/02/2021 ?OT Individual Time: 0704-0815 ?OT Individual Time Calculation (min): 71 min  ? ? ?Short Term Goals: ?Week 1:  OT Short Term Goal 1 (Week 1): Patient will don UB clothing with good recall of hemi technique and Min A with no more than 2 verbal cues. ?OT Short Term Goal 2 (Week 1): Patient complete 1/3 parts of toileting task with Mod A and LRAD. ?OT Short Term Goal 3 (Week 1): Patient will don LB clothing with hemi technique and Mod A with LRAD. ?OT Short Term Goal 4 (Week 1): Patient will complete squat-pivot to BSC with Min A. ? ?Skilled Therapeutic Interventions/Progress Updates:  ?Patient met lying supine in bed in agreement with OT treatment session. 0/10 pain reported at rest and with activity. Requires max cues and mod A for rolling L. Sidelying to sitting EOB with Mod A. Slideboard transfer to wc to R with Mod A. Walk-in shower transfer to tub bench with use of slideboard and Mod A. Able to stand from tub bench with Mod A to doff LB clothing. UB bathing/dressing with Mod A and LB bathing/dressing with Max A and cues for hemi technique throughout. Written RUE self-ROM HEP provided. Will provide education at time of next treatment session. Transferred to recliner in same manner as indicated above. Session concluded with patient seated in recliner with call bell within reach, belt alarm activated and all needs met.  ? ? ?Therapy Documentation ?Precautions:  ?Precautions ?Precautions: Fall ?Restrictions ?Weight Bearing Restrictions: No ?General: ?  ?Therapy/Group: Individual Therapy ? ?Destanae R Howerton-Davis ?05/02/2021, 6:49 AM ?

## 2021-05-02 NOTE — Progress Notes (Signed)
Physical Therapy Session Note ? ?Patient Details  ?Name: Aaron Dennis ?MRN: [id number] ?Date of Birth: 26-Jan-1956 ? ?Today's Date: 05/02/2021 ?PT Individual Time: 1100-1143 ?PT Individual Time Calculation (min): 43 min  and Today's Date: 05/02/2021 ?PT Missed Time: 17 Minutes ?Missed Time Reason: Patient unwilling to participate;Patient fatigue ? ?Short Term Goals: ?Week 1:  PT Short Term Goal 1 (Week 1): Pt will initate gait training ?PT Short Term Goal 2 (Week 1): pt will propel w/c with min A or better ?PT Short Term Goal 3 (Week 1): Pt will perform squat pivot with min A or better ? ?Skilled Therapeutic Interventions/Progress Updates:  ?  Pt received in recliner and agreeable to therapy.  Pt reports no pain on arrival. Pt performs squat pivot to w/c with max A. Pt instructed on and performed  w/c propulsion x 150 ft before being transported remaining distance for energy conservation. Pt then ambulated 2 x 30 ft with hall way rail and min-mod A for RLE advancement and knee block, DF assist in place, pt's brother providing w/c follow and intermittent interpreting PRN. After first bout, pt said "this was enough," and requested to go back to room, but was agreeable to continued participation with minimal encouragement. After second bout pt was more insistent, intermittently c/o headache, dizziness, and fatigue, but then denying dizziness on return to his room. Max A squat pivot to bed and sit>supine with min A for RLE. Pt then remained in supine with RUE elevated on pillow, was left with all needs in reach and alarm active.  ? ?Therapy Documentation ?Precautions:  ?Precautions ?Precautions: Fall ?Restrictions ?Weight Bearing Restrictions: No ?General: ?PT Amount of Missed Time (min): 17 Minutes ?PT Missed Treatment Reason: Patient unwilling to participate;Patient fatigue ? ? ? ? ?Therapy/Group: Individual Therapy ? ?[place] ?05/02/2021, 11:56 AM  ?

## 2021-05-03 DIAGNOSIS — I10 Essential (primary) hypertension: Secondary | ICD-10-CM

## 2021-05-03 DIAGNOSIS — K5901 Slow transit constipation: Secondary | ICD-10-CM

## 2021-05-03 DIAGNOSIS — F329 Major depressive disorder, single episode, unspecified: Secondary | ICD-10-CM

## 2021-05-03 NOTE — Progress Notes (Signed)
Physical Therapy Session Note ? ?Patient Details  ?Name: Aaron Dennis ?MRN: [id number] ?Date of Birth: Aug 12, 1955 ? ?Today's Date: 05/03/2021 ?PT Individual Time: 1093-2355 ?PT Individual Time Calculation (min): 75 min  ? ?Short Term Goals: ?Week 1:  PT Short Term Goal 1 (Week 1): Pt will initate gait training ?PT Short Term Goal 2 (Week 1): pt will propel w/c with min A or better ?PT Short Term Goal 3 (Week 1): Pt will perform squat pivot with min A or better ? ?Skilled Therapeutic Interventions/Progress Updates: Pt presents sitting in w/c and agreeable to therapy.  Pt wheeled to main gym for energy conservation.  Pt performed sit to stand w/ mod A and then SPT w/ mod A to mat table.  Pt sitting edge of mat w/ supervision w/o UE support.  Pt performed reaching across midline and then outside of BOS to R w/ facilitation for WB through R UE.  Pt stacking cones and hooking horseshoes over hoop reaching outside of BOS then back to midline before reaching outside of BOS to Right w/o LOB and mod facilitation at R elbow and shoulder.  Pt performed reclined on R FA/elbow while reaching forward to left for cone stack/unstack.  Pt performed squat pivot to L mat table> w/c w/ verbal cues through son and mod A for stepping.  Min A at R LE.  Pt wheeled to rail in hallway using hemi technique.  Pt amb using L hand rail x 30' w/ min A for placement of R foot using ace wrap for DF assist.  Pt assisting w/ advancement of RLE w/ good weight shift to left.  Min blocking of R knee but also blocking to prevent recurvatum.  Pt educated on use of seated surface for sit <> stand transfers vs hand rail.  Son present throughout session for translating when needed.  Pt returned to room and remained sitting in w/c w/ chair alarm on and all needs in reach.  Multiple family members/friends present at conclusion. ?   ? ?Therapy Documentation ?Precautions:  ?Precautions ?Precautions: Fall ?Restrictions ?Weight Bearing Restrictions: No ?General: ?   ?Vital Signs: ?Therapy Vitals ?Temp: 98.2 ?F (36.8 ?C) ?Temp Source: Oral ?Pulse Rate: 71 ?Resp: 14 ?BP: 130/81 ?Patient Position (if appropriate): Lying ?Oxygen Therapy ?SpO2: (!) 8 % ?O2 Device: Room Air ?Pain:0/10 initially, some discomfort w/ WB activities. ?  ? ? ? ? ?Therapy/Group: Individual Therapy ? ?[name] ?05/03/2021, 9:15 AM  ?

## 2021-05-03 NOTE — Progress Notes (Signed)
Physical Therapy Session Note ? ?Patient Details  ?Name: Aaron Dennis ?MRN: [id number] ?Date of Birth: Jan 18, 1956 ? ?Today's Date: 05/03/2021 ?PT Individual Time: 1615-1700 ?PT Individual Time Calculation (min): 45 min  ? ?Short Term Goals: ?Week 1:  PT Short Term Goal 1 (Week 1): Pt will initate gait training ?PT Short Term Goal 2 (Week 1): pt will propel w/c with min A or better ?PT Short Term Goal 3 (Week 1): Pt will perform squat pivot with min A or better ? ?Skilled Therapeutic Interventions/Progress Updates:  ?  Pt received supine in bed, agreeable to PT session. Pt reports some soreness in his R shoulder from using it a lot in therapy earlier, no other complaints of pain. Supine to sit with mod A needed for trunk elevation. Stand pivot transfers to/from w/c with mod A during session. Sit to stand with min A to L handrail in hallway. Ambulation 2 x 30 ft with L handrail and min to mod A for balance, use of DF assist ACE wrap to RLE, some assist needed for RLE placement due to scissoring of limb. Pt and son requesting exercises to strength hip adductors. Sit to supine Supervision on flat mat table. Supine SKFO x 10 reps, bridges x 10 reps, bridges with resisted hip abd with red theraband x 10 reps, bridges with hip add hold on pillow x 10 reps. Pt appreciative of exercises he can perform in bed, may need some assist with setup and stabilization of RLE. Pt returns to sitting EOM with min A for trunk control. Pt requests to return to bed at end of session due to fatigue. Sit to supine Supervision. Pt left supine in bed in care of family to complete toileting with use of urinal. ? ?Therapy Documentation ?Precautions:  ?Precautions ?Precautions: Fall ?Restrictions ?Weight Bearing Restrictions: No ? ? ? ? ? ? ?Therapy/Group: Individual Therapy ? ? ?[name], PT, DPT, CSRS ?05/03/2021, 5:06 PM  ?

## 2021-05-03 NOTE — Progress Notes (Signed)
Occupational Therapy Session Note ? ?Patient Details  ?Name: Aaron Dennis ?MRN: [id number] ?Date of Birth: [birth year] ? ?Today's Date: 05/03/2021 ?OT Individual Time: 1310-1400 ?OT Individual Time Calculation (min): 50 min  ? ? ?Short Term Goals: ?Week 1:  OT Short Term Goal 1 (Week 1): Patient will don UB clothing with good recall of hemi technique and Min A with no more than 2 verbal cues. ?OT Short Term Goal 2 (Week 1): Patient complete 1/3 parts of toileting task with Mod A and LRAD. ?OT Short Term Goal 3 (Week 1): Patient will don LB clothing with hemi technique and Mod A with LRAD. ?OT Short Term Goal 4 (Week 1): Patient will complete squat-pivot to [facility] with Min A. ? ?Skilled Therapeutic Interventions/Progress Updates:  ?  1:1. Pt received in bed with 2 visitors + son present. Pt agreeable ot OT, but requests this clinician come back in 10 min. Returned later and pt agreeable to OT working on RUE, sitting balance, transfers and NMR.  ? ?Seated trunk control deep WB into R elbow/forearm/wrist: ?R leans onto forearm/push up to midline ?L elbow taps to mat for R oblique activation/trunk lengthening ?Sit ups from therapy ball reach forward to target ?WB into R hand/LE to reach far R past midline weight shifted only feet and R hand off mat to reach for leggos ?Supine NMR with tapping to bicep to improve activation/R attention with gravity assisted and against gravity contraction!!! R lat trace activation noted as well. ? ?Min-mod A squat pivot transfers to/from mat table and bed with gaurding R knee. ? ? ?Therapy Documentation ?Precautions:  ?Precautions ?Precautions: Fall ?Restrictions ?Weight Bearing Restrictions: No ? ? ? ?Therapy/Group: Individual Therapy ? ?Lowella Dell Ailish Prospero ?05/03/2021, 1:09 PM ?

## 2021-05-03 NOTE — Plan of Care (Signed)
?  Problem: Consults ?Goal: RH STROKE PATIENT EDUCATION ?Description: See Patient Education module for education specifics  ?Outcome: Progressing ?  ?Problem: RH BOWEL ELIMINATION ?Goal: RH STG MANAGE BOWEL WITH ASSISTANCE ?Description: STG Manage Bowel with Supervision Assistance. ?Outcome: Progressing ?Goal: RH STG MANAGE BOWEL W/MEDICATION W/ASSISTANCE ?Description: STG Manage Bowel with Medication with Supervision Assistance. ?Outcome: Progressing ?  ?Problem: RH SAFETY ?Goal: RH STG ADHERE TO SAFETY PRECAUTIONS W/ASSISTANCE/DEVICE ?Description: STG Adhere to Safety Precautions With Cues and Reminders. ?Outcome: Progressing ?Goal: RH STG DECREASED RISK OF FALL WITH ASSISTANCE ?Description: STG Decreased Risk of Fall With Supervision Assistance. ?Outcome: Progressing ?  ?Problem: RH COGNITION-NURSING ?Goal: RH STG USES MEMORY AIDS/STRATEGIES W/ASSIST TO PROBLEM SOLVE ?Description: STG Uses Memory Aids/Strategies With Supervision Assistance to Problem Solve. ?Outcome: Progressing ?Goal: RH STG ANTICIPATES NEEDS/CALLS FOR ASSIST W/ASSIST/CUES ?Description: STG Anticipates Needs/Calls for Assist With Cues and Reminders. ?Outcome: Progressing ?  ?Problem: RH KNOWLEDGE DEFICIT ?Goal: RH STG INCREASE KNOWLEDGE OF HYPERTENSION ?Description: Patient will demonstrate knowledge of HTN medications, dietary restrictions, and BP parameters with educational materials and handouts provided by staff independently at discharge. ?Outcome: Progressing ?Goal: RH STG INCREASE KNOWLEDGE OF STROKE PROPHYLAXIS ?Description: Patient will demonstrate knowledge of medications used to prevent future strokes with educational materials and handouts provided by staff independently at discharge. ?Outcome: Progressing ?  ?

## 2021-05-03 NOTE — Progress Notes (Signed)
?                                                       PROGRESS NOTE ? ? ?Subjective/Complaints: ? ?Pt reports that his shoulder is feeling better. Up working with PT on gait/pre-gait activities.  ? ?ROS: Patient denies fever, rash, sore throat, blurred vision, dizziness, nausea, vomiting, diarrhea, cough, shortness of breath or chest pain,  back/neck pain, headache, or mood change.  ? ?Objective: ?  ?No results found. ?No results for input(s): WBC, HGB, HCT, PLT in the last 72 hours. ? ?Recent Labs  ?  05/01/21 ?0350  ?NA 138  ?K 3.7  ?CL 108  ?CO2 24  ?GLUCOSE 87  ?BUN 15  ?CREATININE 0.99  ?CALCIUM 8.8*  ? ? ?Intake/Output Summary (Last 24 hours) at 05/03/2021 1307 ?Last data filed at 05/02/2021 1814 ?Gross per 24 hour  ?Intake 120 ml  ?Output --  ?Net 120 ml  ?  ? ?  ? ?Physical Exam: ?Vital Signs ?Blood pressure 130/81, pulse 71, temperature 98.2 ?F (36.8 ?C), temperature source Oral, resp. rate 14, height '5\' 10"'$  (1.778 m), weight 76.5 kg, SpO2 (!) 8 %. ? ? ? ?Constitutional: No distress . Vital signs reviewed. ?HEENT: NCAT, EOMI, oral membranes moist ?Neck: supple ?Cardiovascular: RRR without murmur. No JVD    ?Respiratory/Chest: CTA Bilaterally without wheezes or rales. Normal effort    ?GI/Abdomen: BS +, non-tender, non-distended ?Ext: no clubbing, cyanosis, or edema ?Psych: pleasant and cooperative  ?Skin: ?   General: Skin is warm and dry.  ?   Comments: B/L forearm IV- L AC fossa IV looks  little red- but RUE looks OK ? MS: R shoulder - no subluxation felt with k tape in place today- much less TTP anterior and posterior this AM ?Neurological:  ?   Mental Status: He is alert and oriented to person, place, and time.  ?   Comments: Speech is at baseline per pt= per son Arabic, primary language is good ?Intact to light touch x 4 extremities. Motor 5/5 LUE and LLE. RUE 0/5. RLE 1+ to 2/5 prox to distal.  ? ? ?Assessment/Plan: ?1. Functional deficits which require 3+ hours per day of interdisciplinary therapy  in a comprehensive inpatient rehab setting. ?Physiatrist is providing close team supervision and 24 hour management of active medical problems listed below. ?Physiatrist and rehab team continue to assess barriers to discharge/monitor patient progress toward functional and medical goals ? ?Care Tool: ? ?Bathing ? Bathing activity did not occur: Safety/medical concerns ?Body parts bathed by patient: Right arm, Chest, Front perineal area, Abdomen, Right upper leg, Left upper leg, Face  ? Body parts bathed by helper: Left arm, Buttocks, Left lower leg, Right lower leg ?  ?  ?Bathing assist Assist Level: Moderate Assistance - Patient 50 - 74% ?  ?  ?Upper Body Dressing/Undressing ?Upper body dressing Upper body dressing/undressing activity did not occur (including orthotics): Safety/medical concerns ?What is the patient wearing?: Pull over shirt ?   ?Upper body assist Assist Level: Moderate Assistance - Patient 50 - 74% ?   ?Lower Body Dressing/Undressing ?Lower body dressing ? ? ? Lower body dressing activity did not occur: Safety/medical concerns ?What is the patient wearing?: Pants, Underwear/pull up ? ?  ? ?Lower body assist Assist for lower body dressing: Maximal Assistance - Patient  25 - 49% ?   ? ?Toileting ?Toileting Toileting Activity did not occur (Probation officer and hygiene only): N/A (no void or bm)  ?Toileting assist   ?  ?  ?Transfers ?Chair/bed transfer ? ?Transfers assist ? Chair/bed transfer activity did not occur: Safety/medical concerns ? ?Chair/bed transfer assist level: Moderate Assistance - Patient 50 - 74% ?  ?  ?Locomotion ?Ambulation ? ? ?Ambulation assist ? ? Ambulation activity did not occur: Safety/medical concerns ? ?Assist level: Moderate Assistance - Patient 50 - 74% ?Assistive device: Other (comment) (hand rail) ?Max distance: 30  ? ?Walk 10 feet activity ? ? ?Assist ? Walk 10 feet activity did not occur: Safety/medical concerns ? ?Assist level: Moderate Assistance - Patient - 50 -  74% ?Assistive device: Other (comment) (hand rail)  ? ?Walk 50 feet activity ? ? ?Assist Walk 50 feet with 2 turns activity did not occur: Safety/medical concerns ? ?  ?   ? ? ?Walk 150 feet activity ? ? ?Assist Walk 150 feet activity did not occur: Safety/medical concerns ? ?  ?  ?  ? ?Walk 10 feet on uneven surface  ?activity ? ? ?Assist Walk 10 feet on uneven surfaces activity did not occur: Safety/medical concerns ? ? ?  ?   ? ?Wheelchair ? ? ? ? ?Assist Is the patient using a wheelchair?: Yes ?Type of Wheelchair: Manual ?  ? ?Wheelchair assist level: Minimal Assistance - Patient > 75% ?Max wheelchair distance: 20  ? ? ?Wheelchair 50 feet with 2 turns activity ? ? ? ?Assist ? ?  ?  ? ? ?   ? ?Wheelchair 150 feet activity  ? ? ? ?Assist ?   ? ? ?   ? ?Blood pressure 130/81, pulse 71, temperature 98.2 ?F (36.8 ?C), temperature source Oral, resp. rate 14, height '5\' 10"'$  (1.778 m), weight 76.5 kg, SpO2 (!) 8 %. ? ?Medical Problem List and Plan: ?1. Functional deficits secondary to acute left frontoparietal infarct ?            -patient may  shower ?            -ELOS/Goals: 7-10 days supervision ? -Continue CIR therapies including PT, OT - K taping helping sublux and shoulder pain ?2.  Antithrombotics: ?-DVT/anticoagulation:  Pharmaceutical: Lovenox ?            -antiplatelet therapy: Plavix and aspirin for 3 weeks then aspirin alone ?3. Pain Management: Tylenol ?4. Mood: LCSW to evaluate and provide emotional support ? 3/23- pt c/o feeling depressed over stroke- will start Celexa 20 mg daily. ?3/25 improved affect with Celexa.   ?            -antipsychotic agents: Not applicable ?5. Neuropsych: This patient is capable of making decisions on his own behalf. ?6. Skin/Wound Care: Routine skin care checks ?    ?7. Fluids/Electrolytes/Nutrition: Routine I's and O's and follow-up chemistries ?8: Cerebral aneurysm: follow-up IR as outpatient ?9: Hyperlipidemia:: continue Crestor 20 mg daily ?10: Essential hypertension:  Continue amlodipine 5 mg daily ?-- Proscar 5 mg daily ?3/25- BP well controlled  ?11: Polycythemia: Follow-up medical oncology as outpatient ?12: GI prophylaxis: Continue Protonix ?13: BPH: Continue Flomax ? 3/25- peeing well- con't flomax ?14. Constipation- LBM 3 days ago- will give miralax prn ?3/22- will give miralax x1 this AM- if doesn't work, will give sorbitol tomorrow  ?3/25- LBM  on 3/24 ? 15/ hypokalemia ? 3/22- will replete 40 mEq x1 and recheck  ?3/23- K+ up to  3.7- con't to monitor- will recheck Monday ?16. Dispo3/22- 1 family member can stay overnight.  ? ? ?  ?  ? ? ? ?LOS: ?4 days ?A FACE TO FACE EVALUATION WAS PERFORMED ? ?Meredith Staggers ?05/03/2021, 1:07 PM  ? ? ? ?

## 2021-05-04 NOTE — Progress Notes (Signed)
Occupational Therapy Session Note ? ?Patient Details  ?Name: Aaron Dennis ?MRN: [id number] ?Date of Birth: 1955-02-21 ? ?Today's Date: 05/04/2021 ?OT Individual Time: 1130-1200 ?OT Individual Time Calculation (min): 30 min  ? ? ?Short Term Goals: ?Week 1:  OT Short Term Goal 1 (Week 1): Patient will don UB clothing with good recall of hemi technique and Min A with no more than 2 verbal cues. ?OT Short Term Goal 2 (Week 1): Patient complete 1/3 parts of toileting task with Mod A and LRAD. ?OT Short Term Goal 3 (Week 1): Patient will don LB clothing with hemi technique and Mod A with LRAD. ?OT Short Term Goal 4 (Week 1): Patient will complete squat-pivot to [facility] with Min A. ? ?Patient met lying supine in bed in agreement with OT treatment session. Mild pain reported in R shoulder with internal/external rotation and when rolling to R. K-tap remains in place on R shoulder. Patient with pillow positioned under RUE while in supine. Education provided on further joint protection. Patient/family expressed verbal understanding. Will continue to address R shoulder during subsequent OT treatment sessions. Supine to EOB with Min A at trunk and Mod cues for hemi technique and hand placement. Session with focus on RUE NMR. Written HEP provided several days ago. Continued education provided this date during short treatment session with patient requiring occasional hand over hand assist and Mod cues for pacing/form. Returned to supine for internal/external rotation with increased tightness noted. Patient then rolled to R for sensory input. Session concluded with patient lying supine in bed with call bell within reach, bed alarm activated and all needs met. Son and brother present at bedside.  ? ?Therapy Documentation ?Precautions:  ?Precautions ?Precautions: Fall ?Restrictions ?Weight Bearing Restrictions: No ?General: ?  ?Therapy/Group: Individual Therapy ? ?Aaron Dennis ?05/04/2021, 7:05 AM ?

## 2021-05-05 LAB — BASIC METABOLIC PANEL
Anion gap: 7 (ref 5–15)
BUN: 15 mg/dL (ref 8–23)
CO2: 23 mmol/L (ref 22–32)
Calcium: 8.9 mg/dL (ref 8.9–10.3)
Chloride: 106 mmol/L (ref 98–111)
Creatinine, Ser: 0.89 mg/dL (ref 0.61–1.24)
GFR, Estimated: >60 mL/min (ref >60)
Glucose, Bld: 91 mg/dL (ref 70–99)
Potassium: 3.7 mmol/L (ref 3.5–5.1)
Sodium: 136 mmol/L (ref 135–145)

## 2021-05-05 LAB — CBC
HCT: 49.1 % (ref 39.0–52.0)
Hemoglobin: 16.6 g/dL (ref 13.0–17.0)
MCH: 27.5 pg (ref 26.0–34.0)
MCHC: 33.8 g/dL (ref 30.0–36.0)
MCV: 81.4 fL (ref 80.0–100.0)
Platelets: 238 10*3/uL (ref 150–400)
RBC: 6.03 MIL/uL — ABNORMAL HIGH (ref 4.22–5.81)
RDW: 13.3 % (ref 11.5–15.5)
WBC: 7.7 10*3/uL (ref 4.0–10.5)
nRBC: 0 % (ref 0.0–0.2)

## 2021-05-05 NOTE — Progress Notes (Addendum)
Physical Therapy Session Note ? ?Patient Details  ?Name: Aaron Dennis ?MRN: [id number] ?Date of Birth: 1955-11-14 ? ?Today's Date: 05/05/2021 ?PT Individual Time: 8891-6945 ?PT Individual Time Calculation (min): 44 min  and Today's Date: 05/05/2021 ?PT Missed Time: 16 Minutes ?Missed Time Reason: Patient ill (Comment) (headache and dizziness) ? ?Short Term Goals: ?Week 1:  PT Short Term Goal 1 (Week 1): Pt will initate gait training ?PT Short Term Goal 2 (Week 1): pt will propel w/c with min A or better ?PT Short Term Goal 3 (Week 1): Pt will perform squat pivot with min A or better ? ?Skilled Therapeutic Interventions/Progress Updates:  ?  pt received in bed and agreeable to therapy. Pt reports headache on arrival which worsened with activity, RN made aware. Starting BP=122/74 as taken by NT at start of session.  ? ?Transfers: ?Supine>sit with min A and heavy VC for placement and flexion/rotation technique.  ?Sit>supine with supervision and VC for using L foot to assist R foot into bed. ?Sit to stand from bed with mod A, fading to min A when cued to push from armrest/bed, but with poor carryover of cueing.  ?Stand pivot transfer with mod A for balance and RLE knee block and placement with hemi walker ? ?Gait training: ? Gait training in hallway with HW and mod A for knee block and placement, min A for swing. VC for longer LLE step length and upright posture, manual facilitation for weight shift. Pt ambulated 2 x 50 ft with this level of assist before stating he had increased headache and was dizzy. ? ?Returned to room to take BP. BP=102/73. Provided education on monitoring BP in accordance with symptoms and that this is not too low, but it did drop with activity. Pt requesting to return to bed at this time. Did so with Stand pivot transfer as noted above and was left with all needs in reach and alarm active. Pt missed 16 min of skilled therapy d/t headache and dizziness, RN made aware. Will make up as able.  ? ?Therapy  Documentation ?Precautions:  ?Precautions ?Precautions: Fall ?Restrictions ?Weight Bearing Restrictions: No ?General: ?PT Amount of Missed Time (min): 16 Minutes ?PT Missed Treatment Reason: Patient ill (Comment) (headache and dizziness) ? ? ? ? ?Therapy/Group: Individual Therapy ? ?Ault ?05/05/2021, 1:55 PM  ?

## 2021-05-05 NOTE — Progress Notes (Signed)
?                                                       PROGRESS NOTE ? ? ?Subjective/Complaints: ? ?Shoulder is "heavy".  ?Pain better, but someone sleeps on R shoulder, which makes it worse.  ? ?No other issues- per pt and family.  ? ?ROS:  ?Pt denies SOB, abd pain, CP, N/V/C/D, and vision changes ? ?Objective: ?  ?No results found. ?Recent Labs  ?  05/05/21 ?0700  ?WBC 7.7  ?HGB 16.6  ?HCT 49.1  ?PLT 238  ? ? ?Recent Labs  ?  05/05/21 ?0700  ?NA 136  ?K 3.7  ?CL 106  ?CO2 23  ?GLUCOSE 91  ?BUN 15  ?CREATININE 0.89  ?CALCIUM 8.9  ? ? ?Intake/Output Summary (Last 24 hours) at 05/05/2021 1502 ?Last data filed at 05/05/2021 1300 ?Gross per 24 hour  ?Intake 240 ml  ?Output 650 ml  ?Net -410 ml  ?  ? ?  ? ?Physical Exam: ?Vital Signs ?Blood pressure 122/74, pulse 82, temperature 97.9 ?F (36.6 ?C), temperature source Oral, resp. rate 17, height '5\' 10"'$  (1.778 m), weight 76.5 kg, SpO2 99 %. ? ? ? ?General: awake, alert, appropriate, NAD ?HENT: conjugate gaze; oropharynx moist ?CV: regular rate; no JVD ?Pulmonary: CTA B/L; no W/R/R- good air movement ?GI: soft, NT, ND, (+)BS ?Psychiatric: appropriate ?Neurological: Ox3 ? ?Skin: ?   General: Skin is warm and dry.  ? ? MS: R shoulder - no subluxation felt with k tape in place today- much less TTP anterior and posterior this AM- looks better- but no change in strength ?Neurological:  ?   Mental Status: He is alert and oriented to person, place, and time.  ?   Comments: Speech is at baseline per pt= per son Arabic, primary language is good ?Intact to light touch x 4 extremities. Motor 5/5 LUE and LLE. RUE 0/5. RLE 1+ to 2/5 prox to distal.  ? ? ?Assessment/Plan: ?1. Functional deficits which require 3+ hours per day of interdisciplinary therapy in a comprehensive inpatient rehab setting. ?Physiatrist is providing close team supervision and 24 hour management of active medical problems listed below. ?Physiatrist and rehab team continue to assess barriers to discharge/monitor  patient progress toward functional and medical goals ? ?Care Tool: ? ?Bathing ? Bathing activity did not occur: Safety/medical concerns ?Body parts bathed by patient: Right arm, Chest, Front perineal area, Abdomen, Right upper leg, Left upper leg, Face  ? Body parts bathed by helper: Left arm, Buttocks, Left lower leg, Right lower leg ?  ?  ?Bathing assist Assist Level: Moderate Assistance - Patient 50 - 74% ?  ?  ?Upper Body Dressing/Undressing ?Upper body dressing Upper body dressing/undressing activity did not occur (including orthotics): Safety/medical concerns ?What is the patient wearing?: Pull over shirt ?   ?Upper body assist Assist Level: Moderate Assistance - Patient 50 - 74% ?   ?Lower Body Dressing/Undressing ?Lower body dressing ? ? ? Lower body dressing activity did not occur: Safety/medical concerns ?What is the patient wearing?: Pants, Underwear/pull up ? ?  ? ?Lower body assist Assist for lower body dressing: Maximal Assistance - Patient 25 - 49% ?   ? ?Toileting ?Toileting Toileting Activity did not occur (Probation officer and hygiene only): N/A (no void or bm)  ?Toileting assist Assist  for toileting: Maximal Assistance - Patient 25 - 49% ?  ?  ?Transfers ?Chair/bed transfer ? ?Transfers assist ? Chair/bed transfer activity did not occur: Safety/medical concerns ? ?Chair/bed transfer assist level: Moderate Assistance - Patient 50 - 74% ?  ?  ?Locomotion ?Ambulation ? ? ?Ambulation assist ? ? Ambulation activity did not occur: Safety/medical concerns ? ?Assist level: Moderate Assistance - Patient 50 - 74% ?Assistive device: Other (comment) (hand rail) ?Max distance: 30  ? ?Walk 10 feet activity ? ? ?Assist ? Walk 10 feet activity did not occur: Safety/medical concerns ? ?Assist level: Moderate Assistance - Patient - 50 - 74% ?Assistive device: Other (comment) (hand rail)  ? ?Walk 50 feet activity ? ? ?Assist Walk 50 feet with 2 turns activity did not occur: Safety/medical concerns ? ?  ?    ? ? ?Walk 150 feet activity ? ? ?Assist Walk 150 feet activity did not occur: Safety/medical concerns ? ?  ?  ?  ? ?Walk 10 feet on uneven surface  ?activity ? ? ?Assist Walk 10 feet on uneven surfaces activity did not occur: Safety/medical concerns ? ? ?  ?   ? ?Wheelchair ? ? ? ? ?Assist Is the patient using a wheelchair?: Yes ?Type of Wheelchair: Manual ?  ? ?Wheelchair assist level: Minimal Assistance - Patient > 75% ?Max wheelchair distance: 20  ? ? ?Wheelchair 50 feet with 2 turns activity ? ? ? ?Assist ? ?  ?  ? ? ?   ? ?Wheelchair 150 feet activity  ? ? ? ?Assist ?   ? ? ?   ? ?Blood pressure 122/74, pulse 82, temperature 97.9 ?F (36.6 ?C), temperature source Oral, resp. rate 17, height '5\' 10"'$  (1.778 m), weight 76.5 kg, SpO2 99 %. ? ?Medical Problem List and Plan: ?1. Functional deficits secondary to acute left frontoparietal infarct ?            -patient may  shower ?            -ELOS/Goals: 7-10 days supervision ? Con't CIR- PT, OT and SLP- con't k taping ?2.  Antithrombotics: ?-DVT/anticoagulation:  Pharmaceutical: Lovenox ?            -antiplatelet therapy: Plavix and aspirin for 3 weeks then aspirin alone ?3. Pain Management: Tylenol ?4. Mood: LCSW to evaluate and provide emotional support ? 3/23- pt c/o feeling depressed over stroke- will start Celexa 20 mg daily. ?3/27- improved mood with celexa- con't regimen   ?            -antipsychotic agents: Not applicable ?5. Neuropsych: This patient is capable of making decisions on his own behalf. ?6. Skin/Wound Care: Routine skin care checks ?    ?7. Fluids/Electrolytes/Nutrition: Routine I's and O's and follow-up chemistries ?8: Cerebral aneurysm: follow-up IR as outpatient ?9: Hyperlipidemia:: continue Crestor 20 mg daily ?10: Essential hypertension: Continue amlodipine 5 mg daily ?-- Proscar 5 mg daily ?3/27- BP looks great- con't regimen  ?11: Polycythemia: Follow-up medical oncology as outpatient ?12: GI prophylaxis: Continue Protonix ?13: BPH:  Continue Flomax ? 3/25- peeing well- con't flomax ?14. Constipation- LBM 3 days ago- will give miralax prn ?3/22- will give miralax x1 this AM- if doesn't work, will give sorbitol tomorrow  ?3/27- going "regularly" per pt- con't regimen ? 15/ hypokalemia ? 3/22- will replete 40 mEq x1 and recheck  ?3/27- K+ 3.7- con't regimen off K+.  ?16. Dispo3/22- 1 family member can stay overnight.  ? ? ?  ?  ? ? ? ?  LOS: ?6 days ?A FACE TO FACE EVALUATION WAS PERFORMED ? ?Ovetta Bazzano ?05/05/2021, 3:02 PM  ? ? ? ?

## 2021-05-05 NOTE — Progress Notes (Signed)
Occupational Therapy Session Note ? ?Patient Details  ?Name: MAXINE HUYNH ?MRN: [id number] ?Date of Birth: [birth year] ? ?Today's Date: 05/05/2021 ?OT Individual Time: 1660-6301 ?OT Individual Time Calculation (min): 45 min  ? ? ?Short Term Goals: ?Week 1:  OT Short Term Goal 1 (Week 1): Patient will don UB clothing with good recall of hemi technique and Min A with no more than 2 verbal cues. ?OT Short Term Goal 2 (Week 1): Patient complete 1/3 parts of toileting task with Mod A and LRAD. ?OT Short Term Goal 3 (Week 1): Patient will don LB clothing with hemi technique and Mod A with LRAD. ?OT Short Term Goal 4 (Week 1): Patient will complete squat-pivot to [facility] with Min A. ? ?Skilled Therapeutic Interventions/Progress Updates:  ?  Pt resting in w/c upon arrival with family present. OT intervention with focus on RUE PROM/AAROM. Pt with trace biceps and shoulder flexion.  ? ?1:1 NMES applied to Rt hand/wrist flexors/extensors and elbow flexor (biceps) to facilitate active muscle activation and functional use ? ?Wrist/hand flexors ?Ratio 1:3 ?Rate 35 pps ?Waveform- Asymmetric ?Ramp 1.0 ?Pulse 300 ?Intensity- 15 ?Duration -  5 mins ? ?Wrist/hand extensors ?Ratio 1:3 ?Rate 35 pps ?Waveform- Asymmetric ?Ramp 1.0 ?Pulse 300 ?Intensity- 13 ?Duration -  5 mins ? ?Biceps ?Ratio 1:3 ?Rate 35 pps ?Waveform- Asymmetric ?Ramp 1.0 ?Pulse 300 ?Intensity- 14 ?Duration -  5 mins ? ?No adverse reactions after treatment and is skin intact.  ? ?Pt required max verbal cues to attend to RUE during activities and NMES. Improved results when pt attends to tasks. ? ?Pt remained in w/c with half lap tray in place and all needs within reach. Family present. ? ? ?Therapy Documentation ?Precautions:  ?Precautions ?Precautions: Fall ?Restrictions ?Weight Bearing Restrictions: No ?  ?Pain: ? Pt reports Rt shoulder pain (unrated); Kinesio tape in place, repositioned ? ? ?Therapy/Group: Individual Therapy ? ?Leroy Libman ?05/05/2021, 12:29 PM ?

## 2021-05-05 NOTE — Progress Notes (Signed)
Physical Therapy Session Note ? ?Patient Details  ?Name: Aaron Dennis ?MRN: [id number] ?Date of Birth: 1955-10-01 ? ?Today's Date: 05/05/2021 ?PT Individual Time: 0037-0488 ?PT Individual Time Calculation (min): 73 min  ? ?Short Term Goals: ?Week 1:  PT Short Term Goal 1 (Week 1): Pt will initate gait training ?PT Short Term Goal 2 (Week 1): pt will propel w/c with min A or better ?PT Short Term Goal 3 (Week 1): Pt will perform squat pivot with min A or better ? ?Skilled Therapeutic Interventions/Progress Updates:  ?Patient supine in bed on entrance to room. Son present. Patient alert and agreeable to PT session.  ? ?Patient with no pain complaint at start of session. ? ?Therapeutic Activity: ?Bed Mobility: Patient performed supine --> sit with MinA and HHA to LUE. Pt able to bring BLE off EOB and pulls straight upright to sitting with HHA. VC/ tc required for abdominal engagement. Return to supine with CGA/ minA for RLE. ?Transfers: Pt performs squat pivot transfer to R side bed--> w/c with max cues for set up and MinA to perform. Patient performed sit<>stand and stand pivot transfers throughout session with Mod A initially for balance and BLE extension. Following NMR, pt performs with consistent MinA.  Provided verbal cues for hand placement to knees or seat, reaching back prior to sit, full BLE knee extension, Completing turn prior to sit. On ambulatory return to room, pt fatigued from ambulation bout, and despite cues, discards HW, neglects completing pivot stepping to reach out with L hand to bed rail and "throw" self/ slide to L into seated position on EOB. Pt provided with education to complete turn for full safety. Family members explaining that he was tired. Continued education to all that for improved safety, and pt with enough energy to complete 4 more steps to complete turn for safe descent to seat.  ? ?Neuromuscular Re-ed: ?NMR facilitated during session with focus on standing balance. Pt guided in use of  BITS for forced RLE muscle activation, return to midline following reach outside of BOS. He completes sequencing of numbers 1-15 in static search of screen for all numbers. First bout completed in 76mn 7 sec with 78.95% accuracy and reaction time of 4.49s. Second bout completed in 38 sec with 88.24% accuracy and reaction time of 2.52s. Then rotation component added to task with pt completing in 43sec with 83.33% accuracy and reaction time of 2.84s. Demonstrating learning. Pt poor with dual task of number finding AND focus on maintaining balance as he will consistently forgo R knee extension in order to find and touch numbers on screen, Requires consistent Min/ ModA for maintaining R knee extension and up to intermittent MaxA with reaching to R with L hand to touch target numbers on screen. VC/ tc throughout for attn to balance and return to midline between target touches - but pt forgoes returning to midline for continuing to hit targets. NMR performed for improvements in motor control and coordination, balance, sequencing, judgement, and self confidence/ efficacy in performing all aspects of mobility at highest level of independence.  ? ?Gait Training:  ?Patient ambulated 624 x1 and 814 x1 using HW with overall ModA and +2 for w/c follow. Demonstrated need for CGA and up to MPontotocto produce knee ext consistently during stance phase in all bouts with consistent tc for quad activation. Provided vc/ tc for sequencing of gait pattern with HW, increasing step length/ height with RLE, and upright posture. When fatiguing pt requires intermittent light MinA for RLE  advancement.  ? ?Patient supine  in bed at end of session with brakes locked, bed alarm set, and all needs within reach. Several family, friends and sons present.  ? ? ?Therapy Documentation ?Precautions:  ?Precautions ?Precautions: Fall ?Restrictions ?Weight Bearing Restrictions: No ?General: ?  ?Vital Signs: ?  ?Pain: ?Pt with one instance of headache during  session, that quickly dissipates with seated rest. No indication from therapist provided that he will return to room with headache pain.  ? ?Therapy/Group: Individual Therapy ? ?Alger Simons PT, DPT ?05/05/2021, 6:27 PM  ?

## 2021-05-05 NOTE — Plan of Care (Signed)
?  Problem: Consults ?Goal: RH STROKE PATIENT EDUCATION ?Description: See Patient Education module for education specifics  ?Outcome: Progressing ?  ?Problem: RH BOWEL ELIMINATION ?Goal: RH STG MANAGE BOWEL WITH ASSISTANCE ?Description: STG Manage Bowel with Supervision Assistance. ?Outcome: Progressing ?Goal: RH STG MANAGE BOWEL W/MEDICATION W/ASSISTANCE ?Description: STG Manage Bowel with Medication with Supervision Assistance. ?Outcome: Progressing ?  ?Problem: RH SAFETY ?Goal: RH STG ADHERE TO SAFETY PRECAUTIONS W/ASSISTANCE/DEVICE ?Description: STG Adhere to Safety Precautions With Cues and Reminders. ?Outcome: Progressing ?Goal: RH STG DECREASED RISK OF FALL WITH ASSISTANCE ?Description: STG Decreased Risk of Fall With Supervision Assistance. ?Outcome: Progressing ?  ?Problem: RH COGNITION-NURSING ?Goal: RH STG USES MEMORY AIDS/STRATEGIES W/ASSIST TO PROBLEM SOLVE ?Description: STG Uses Memory Aids/Strategies With Supervision Assistance to Problem Solve. ?Outcome: Progressing ?Goal: RH STG ANTICIPATES NEEDS/CALLS FOR ASSIST W/ASSIST/CUES ?Description: STG Anticipates Needs/Calls for Assist With Cues and Reminders. ?Outcome: Progressing ?  ?Problem: RH KNOWLEDGE DEFICIT ?Goal: RH STG INCREASE KNOWLEDGE OF HYPERTENSION ?Description: Patient will demonstrate knowledge of HTN medications, dietary restrictions, and BP parameters with educational materials and handouts provided by staff independently at discharge. ?Outcome: Progressing ?Goal: RH STG INCREASE KNOWLEDGE OF STROKE PROPHYLAXIS ?Description: Patient will demonstrate knowledge of medications used to prevent future strokes with educational materials and handouts provided by staff independently at discharge. ?Outcome: Progressing ?  ?

## 2021-05-06 MED ORDER — TRAZODONE HCL 50 MG PO TABS
50.0000 mg | ORAL_TABLET | Freq: Every day | ORAL | Status: DC
  Administered 2021-05-06: 50 mg via ORAL
  Filled 2021-05-06: qty 1

## 2021-05-06 NOTE — Patient Care Conference (Signed)
Inpatient RehabilitationTeam Conference and Plan of Care Update ?Date: 05/06/2021   Time: 11:23 AM  ? ? ?Patient Name: Aaron Dennis      ?Medical Record Number: [id number]  ?Date of Birth: [birth year] ?Sex: Male         ?Room/Bed: 0J62E/3M62H-47 ?Payor Info: Payor: Marine scientist / Plan: [facility] MEDICARE / Product Type: *No Product type* /   ? ?Admit Date/Time:  04/29/2021  3:52 PM ? ?Primary Diagnosis:  Stroke (cerebrum) ([place]) ? ?Hospital Problems: Principal Problem: ?  Stroke (cerebrum) ([place]) ? ? ? ?Expected Discharge Date: Expected Discharge Date: 05/20/21 ? ?Team Members Present: ?Physician leading conference: Dr. Courtney Heys ?Social Worker Present: Ovidio Kin, LCSW ?Nurse Present: Dorthula Nettles, RN ?PT Present: Ailene Rud, PT ?OT Present: Willeen Cass, OT ?PPS Coordinator present : Ileana Ladd, PT ? ?   Current Status/Progress Goal Weekly Team Focus  ?Bowel/Bladder ? ? continent to bowel and bladder LBM 3/27  remain continent  toilet as needed   ?Swallow/Nutrition/ Hydration ? ?           ?ADL's ? ? bating-mod A: UB dressing-mod A: LB dressing-max A; flaccid RUE but responding minimally to NMES  supervsion overall  BADL retraining, functional transfers, education, RUE NMR   ?Mobility ? ? min STS with cues, but not carrying over cues. Gait with rail or hemi walker with mod A,  supervision-CGA  gait, transfers, sitting balance   ?Communication ? ?           ?Safety/Cognition/ Behavioral Observations ?           ?Pain ? ? Denies pain  denies pain  assess pain q 4hr and prn   ?Skin ? ? skin intact  remain free from breakdown  assess skin q shift and prn   ? ? ?Discharge Planning:  ?Home with family.   ?Team Discussion: ?Left frontal parietal infarct. RUE very weak. Started Celexa for depression, Trazodone for insomnia, Flomax for BPH. Continent B/B, reports shoulder pain, K-taped. Home with family, good support. Will need AFO consult next week. ? ?Patient on target to meet rehab goals: ?yes,  supervision/CGA goals. Currently mod assist upper body, max assist lower body. Right arm not responding to E-stim. Min assist STS. Not carrying over cues. Poor sitting balance and coming forward. ? ?*See Care Plan and progress notes for long and short-term goals.  ? ?Revisions to Treatment Plan:  ?Adjusting medications. ?  ?Teaching Needs: ?Family education, medication/pain management, transfer/gait training, etc. ?  ?Current Barriers to Discharge: ?Decreased caregiver support, Lack of/limited family support, and pain. ? ?Possible Resolutions to Barriers: ?Family education ?Order recommended DME ?Follow-up HH/Outpatient therapy ?  ? ? Medical Summary ?Current Status: continent of B/B- denies pain except R shoulder- k taping- in place- helped the subluxation- ? Barriers to Discharge: Home enviroment access/layout;Medical stability;Weight bearing restrictions;Other (comments) ? Barriers to Discharge Comments: depression over stroke- home with multiple family members ?Possible Resolutions to [employer] Focus: estim- poor response- flaccid RUE- little more strength in RLE- not carrying cues well will need R AFO- poor sitting balance- goals Supervision-CGA with PT- no SLP- d/c 4/11 ? ? ?Continued Need for Acute Rehabilitation Level of Care: The patient requires daily medical management by a physician with specialized training in physical medicine and rehabilitation for the following reasons: ?Direction of a multidisciplinary physical rehabilitation program to maximize functional independence : Yes ?Medical management of patient stability for increased activity during participation in an intensive rehabilitation regime.: Yes ?Analysis of laboratory values  and/or radiology reports with any subsequent need for medication adjustment and/or medical intervention. : Yes ? ? ?I attest that I was present, lead the team conference, and concur with the assessment and plan of the team. ? ? ?Dorthula Nettles G ?05/06/2021, 4:38  PM  ? ? ? ? ? ? ?

## 2021-05-06 NOTE — Progress Notes (Signed)
?                                                       PROGRESS NOTE ? ? ?Subjective/Complaints: ? ?Pt reports stil heavy on RUE- mainly shoulder- arm still very weak- no change so far.  ?Slept poorly- 9pm to 2am and didn't go back to sleep all night.  ?.  ? ?ROS:  ? ?Pt denies SOB, abd pain, CP, N/V/C/D, and vision changes ? ? ?Objective: ?  ?No results found. ?Recent Labs  ?  05/05/21 ?0700  ?WBC 7.7  ?HGB 16.6  ?HCT 49.1  ?PLT 238  ? ? ?Recent Labs  ?  05/05/21 ?0700  ?NA 136  ?K 3.7  ?CL 106  ?CO2 23  ?GLUCOSE 91  ?BUN 15  ?CREATININE 0.89  ?CALCIUM 8.9  ? ? ?Intake/Output Summary (Last 24 hours) at 05/06/2021 1008 ?Last data filed at 05/06/2021 0500 ?Gross per 24 hour  ?Intake 240 ml  ?Output 250 ml  ?Net -10 ml  ?  ? ?  ? ?Physical Exam: ?Vital Signs ?Blood pressure 130/82, pulse 75, temperature 98 ?F (36.7 ?C), temperature source Oral, resp. rate 18, height '5\' 10"'$  (1.778 m), weight 76.5 kg, SpO2 99 %. ? ? ? ? ?General: awake, alert, appropriate, sitting up in w/c in room; NAD ?HENT: conjugate gaze; oropharynx moist ?CV: regular rate; no JVD ?Pulmonary: CTA B/L; no W/R/R- good air movement ?GI: soft, NT, ND, (+)BS ?Psychiatric: appropriate- interactive ?Neurological: Ox3- no movement of RUE ?Skin: ?   General: Skin is warm and dry.  ? ? MS: R shoulder - no subluxation felt with k tape in place today- much less TTP anterior and posterior this AM- looks better- but no change in strength- no change today ?Neurological:  ?   Mental Status: He is alert and oriented to person, place, and time.  ?   Comments: Speech is at baseline per pt= per son Arabic, primary language is good ?Intact to light touch x 4 extremities. Motor 5/5 LUE and LLE. RUE 0/5. RLE 1+ to 2/5 prox to distal.  ? ? ?Assessment/Plan: ?1. Functional deficits which require 3+ hours per day of interdisciplinary therapy in a comprehensive inpatient rehab setting. ?Physiatrist is providing close team supervision and 24 hour management of active medical  problems listed below. ?Physiatrist and rehab team continue to assess barriers to discharge/monitor patient progress toward functional and medical goals ? ?Care Tool: ? ?Bathing ? Bathing activity did not occur: Safety/medical concerns ?Body parts bathed by patient: Right arm, Chest, Front perineal area, Abdomen, Right upper leg, Left upper leg, Face  ? Body parts bathed by helper: Left arm, Buttocks, Left lower leg, Right lower leg ?  ?  ?Bathing assist Assist Level: Moderate Assistance - Patient 50 - 74% ?  ?  ?Upper Body Dressing/Undressing ?Upper body dressing Upper body dressing/undressing activity did not occur (including orthotics): Safety/medical concerns ?What is the patient wearing?: Pull over shirt ?   ?Upper body assist Assist Level: Moderate Assistance - Patient 50 - 74% ?   ?Lower Body Dressing/Undressing ?Lower body dressing ? ? ? Lower body dressing activity did not occur: Safety/medical concerns ?What is the patient wearing?: Pants, Underwear/pull up ? ?  ? ?Lower body assist Assist for lower body dressing: Maximal Assistance - Patient 25 - 49% ?   ? ?  Toileting ?Toileting Toileting Activity did not occur (Probation officer and hygiene only): N/A (no void or bm)  ?Toileting assist Assist for toileting: Total Assistance - Patient < 25% ?  ?  ?Transfers ?Chair/bed transfer ? ?Transfers assist ? Chair/bed transfer activity did not occur: Safety/medical concerns ? ?Chair/bed transfer assist level: Moderate Assistance - Patient 50 - 74% ?  ?  ?Locomotion ?Ambulation ? ? ?Ambulation assist ? ? Ambulation activity did not occur: Safety/medical concerns ? ?Assist level: Moderate Assistance - Patient 50 - 74% ?Assistive device: Other (comment) (hand rail) ?Max distance: 30  ? ?Walk 10 feet activity ? ? ?Assist ? Walk 10 feet activity did not occur: Safety/medical concerns ? ?Assist level: Moderate Assistance - Patient - 50 - 74% ?Assistive device: Other (comment) (hand rail)  ? ?Walk 50 feet  activity ? ? ?Assist Walk 50 feet with 2 turns activity did not occur: Safety/medical concerns ? ?  ?   ? ? ?Walk 150 feet activity ? ? ?Assist Walk 150 feet activity did not occur: Safety/medical concerns ? ?  ?  ?  ? ?Walk 10 feet on uneven surface  ?activity ? ? ?Assist Walk 10 feet on uneven surfaces activity did not occur: Safety/medical concerns ? ? ?  ?   ? ?Wheelchair ? ? ? ? ?Assist Is the patient using a wheelchair?: Yes ?Type of Wheelchair: Manual ?  ? ?Wheelchair assist level: Minimal Assistance - Patient > 75% ?Max wheelchair distance: 20  ? ? ?Wheelchair 50 feet with 2 turns activity ? ? ? ?Assist ? ?  ?  ? ? ?   ? ?Wheelchair 150 feet activity  ? ? ? ?Assist ?   ? ? ?   ? ?Blood pressure 130/82, pulse 75, temperature 98 ?F (36.7 ?C), temperature source Oral, resp. rate 18, height '5\' 10"'$  (1.778 m), weight 76.5 kg, SpO2 99 %. ? ?Medical Problem List and Plan: ?1. Functional deficits secondary to acute left frontoparietal infarct ?            -patient may  shower ?            -ELOS/Goals: 7-10 days supervision ? Continue CIR- PT, OT and SLP ? Team conference today to determine length of stay ?2.  Antithrombotics: ?-DVT/anticoagulation:  Pharmaceutical: Lovenox ?            -antiplatelet therapy: Plavix and aspirin for 3 weeks then aspirin alone ?3. Pain Management: Tylenol ?4. Mood: LCSW to evaluate and provide emotional support ? 3/23- pt c/o feeling depressed over stroke- will start Celexa 20 mg daily. ?3/27- improved mood with celexa- con't regimen   ?            -antipsychotic agents: Not applicable ?5. Neuropsych: This patient is capable of making decisions on his own behalf. ?6. Skin/Wound Care: Routine skin care checks ?    ?7. Fluids/Electrolytes/Nutrition: Routine I's and O's and follow-up chemistries ?8: Cerebral aneurysm: follow-up IR as outpatient ?9: Hyperlipidemia:: continue Crestor 20 mg daily ?10: Essential hypertension: Continue amlodipine 5 mg daily ?-- Proscar 5 mg daily ?3/28- BP  great control- con't regimen  ?11: Polycythemia: Follow-up medical oncology as outpatient ?12: GI prophylaxis: Continue Protonix ?13: BPH: Continue Flomax ? 3/25- peeing well- con't flomax ?14. Constipation- LBM 3 days ago- will give miralax prn ?3/22- will give miralax x1 this AM- if doesn't work, will give sorbitol tomorrow  ?3/27- going "regularly" per pt- con't regimen ? 15/ hypokalemia ? 3/22- will replete 40 mEq x1 and  recheck  ?3/27- K+ 3.7- con't regimen off K+. ?16. Insomnia ?3/28- Will try Low dose Trazodone and monitor for urinary retention- 50 mg QHS-   ?17. Dispo3/22- 1 family member can stay overnight.  ? ? ?  ? I spent a total of 37   minutes on total care today- >50% coordination of care- due to team conference and d/w therapy about K taping ? ? ? ? ?LOS: ?7 days ?A FACE TO FACE EVALUATION WAS PERFORMED ? ?Lauriel Helin ?05/06/2021, 10:08 AM  ? ? ? ?

## 2021-05-06 NOTE — Progress Notes (Signed)
Occupational Therapy Session Note ? ?Patient Details  ?Name: Aaron Dennis ?MRN: [id number] ?Date of Birth: 05-Sep-1955 ? ?Today's Date: 05/06/2021 ?OT Individual Time: 7824-2353 ?OT Individual Time Calculation (min): 60 min  ? ? ?Short Term Goals: ?Week 1:  OT Short Term Goal 1 (Week 1): Patient will don UB clothing with good recall of hemi technique and Min A with no more than 2 verbal cues. ?OT Short Term Goal 2 (Week 1): Patient complete 1/3 parts of toileting task with Mod A and LRAD. ?OT Short Term Goal 3 (Week 1): Patient will don LB clothing with hemi technique and Mod A with LRAD. ?OT Short Term Goal 4 (Week 1): Patient will complete squat-pivot to [facility] with Min A. ?Week 2:    ? ?Skilled Therapeutic Interventions/Progress Updates:  ?  Patient in bed upon arrival, patient able to come from supine to EOB with ModA secondary to posterior lean. Patient worked on sliding down to side lying while seated EOB and pushing himself into a seated position 5X, the pt was able to complete 3 of  5 with MinA using his core muscle and his LUE.  The pt completed a simulated task in UB dressing with initial vc's and MinA, he was able to complete LB dressing using medium grade theraband with ModA secondary to challenges with maintain good anatomical positioning and coordination his hands and feet. The pt was engaged in resistive exercises to improve upon his UB strength for greater balance during functional task performance. The pt completed the session completing UB exercise using a 3lb dumb bell for bicep curls, horizontal abduction to improve UB strength tor greater mastery during transfers and BADL task initiation and completion.  The pt returned to bed LOF with his bed alarm activated, his call light and bedside table within reach.  Family was present at the time of treatment, the patient had no c/o pain this treatment session.  ? ?Therapy Documentation ?Precautions:  ?Precautions ?Precautions: Fall ?Restrictions ?Weight  Bearing Restrictions: No ? ? ?Therapy/Group: Individual Therapy ? ?Yvonne Kendall ?05/06/2021, 12:30 PM ?

## 2021-05-06 NOTE — Progress Notes (Signed)
Physical Therapy Session Note ? ?Patient Details  ?Name: Aaron Dennis ?MRN: [id number] ?Date of Birth: 11-Jun-1955 ? ?Today's Date: 05/06/2021 ?PT Individual Time: 0800-0909 ?PT Individual Time Calculation (min): 69 min  ? ?Short Term Goals: ?Week 1:  PT Short Term Goal 1 (Week 1): Pt will initate gait training ?PT Short Term Goal 2 (Week 1): pt will propel w/c with min A or better ?PT Short Term Goal 3 (Week 1): Pt will perform squat pivot with min A or better ? ?Skilled Therapeutic Interventions/Progress Updates:  ?  Pt seated in w/c on arrival and agreeable to therapy. Pt reports mild headache, unrated. Pt denied any intervention at this time.  ? ?Transfers: ?Sit to stand- min A for knee block and balance when pushing from seat, but requires consistent cueing.  ?Stand pivot transfer- min A for balance and RLE knee block and foot placement ?Supine<>sit-Pt performed several times from bed and mat table, min A overall with max cueing for flexion-rotation technique  ? ?W/c: ?Pt propelled w/c to/from room with hemi technique, including navigating tight spaces, turning around in room, and setting up for transfers with cueing for navigation and technique. ? ?NMR: ?Pt performed modified Sit ups to PB, 2 x 10 with cueing for placement, for improved sitting balance and anterior weight shift ?Pt performed lateral push ups from elbow x 2 bouts, with assist. Noted activation in triceps and periscapular muscles.  ?Pt c/o dizziness with activity, so performed dix hallpike test BIL, both negative. ? ?Discussed that symptoms are likely orthostatic in nature and we will continue to monitor vitals. Pt returned to room and remained in chair, was left with all needs in reach and alarm active.  ? ? ?Therapy Documentation ?Precautions:  ?Precautions ?Precautions: Fall ?Restrictions ?Weight Bearing Restrictions: No ?General: ?  ? ? ? ? ?Therapy/Group: Individual Therapy ? ?[name] ?05/06/2021, 9:19 AM  ?

## 2021-05-06 NOTE — Progress Notes (Signed)
Patient ID: Aaron Dennis, male   DOB: 13-Feb-1955, 66 y.o.   MRN: [id number]  Met with pt and son who was present in his room to give them both a team conference update regarding goals of supervision-CGA level of assist and target discharge date of 4/11. Pt feels he is progressing and glad of this and hopes to sleep better tonight with MD prescribing his a sleep med. Discussed the OT will re-tape his shoulder since it helped with the pian the last time. Aware closer to discharge date family will need to come in and do hands on training prior to discharging home. Continue to work on discharge needs. ?

## 2021-05-06 NOTE — Progress Notes (Signed)
Physical Therapy Session Note ? ?Patient Details  ?Name: Aaron Dennis ?MRN: [id number] ?Date of Birth: [birth year] ? ?Today's Date: 05/06/2021 ?PT Individual Time: 8527-7824 ?PT Individual Time Calculation (min): 40 min  ? ?Short Term Goals: ?Week 1:  PT Short Term Goal 1 (Week 1): Pt will initate gait training ?PT Short Term Goal 2 (Week 1): pt will propel w/c with min A or better ?PT Short Term Goal 3 (Week 1): Pt will perform squat pivot with min A or better ? ?Skilled Therapeutic Interventions/Progress Updates:  ?  Patient received reclined in bed, agreeable to PT. He denies pain. He came to sit EOB with MinA and verbal cues for sequencing. MinA stand pivot to wc with HW. PT transporting patient in wc to therapy gym for time management and energy conservation. He ambulated 31f with HW, ModA and DF wrap to R LE. Multimodal cues for increased quad engagement in stance. Patient requiring max verbal cues for appropriate sequencing using HW. Patient with very short step length L LE and poor weight shift R. PT applying Matrix MAX GRAFO to RLE and patient ambulated additional 225fwith MinA and HW. Slightly improved stance control noted. PT then applying [place] walkon GRAFO and patient had significantly improved stance control and improved quality of L step as well. He ambulated additional 2522fith [place] with HW and MinA. Returning to his room in wc, MinA stand pivot to bed, supervision to return supine. Bed alarm on, call light within reach.  ? ?Therapy Documentation ?Precautions:  ?Precautions ?Precautions: Fall ?Restrictions ?Weight Bearing Restrictions: No ? ? ? ?Therapy/Group: Individual Therapy ? ?JenDebbora DusenDebbora DusT, DPT, CBIS ? ?05/06/2021, 7:38 AM  ?

## 2021-05-07 MED ORDER — TRAZODONE HCL 50 MG PO TABS
100.0000 mg | ORAL_TABLET | Freq: Every day | ORAL | Status: DC
Start: 2021-05-07 — End: 2021-05-20
  Administered 2021-05-07 – 2021-05-19 (×13): 100 mg via ORAL
  Filled 2021-05-07 (×13): qty 2

## 2021-05-07 MED ORDER — ACETAMINOPHEN 325 MG PO TABS
650.0000 mg | ORAL_TABLET | Freq: Every day | ORAL | Status: DC
Start: 2021-05-07 — End: 2021-05-20
  Administered 2021-05-07 – 2021-05-16 (×8): 650 mg via ORAL
  Filled 2021-05-07 (×13): qty 2

## 2021-05-07 NOTE — Progress Notes (Signed)
Physical Therapy Weekly Progress Note ? ?Patient Details  ?Name: Aaron Dennis ?MRN: [id number] ?Date of Birth: 11/05/1955 ? ?Beginning of progress report period: April 30, 2021 ?End of progress report period: May 07, 2021 ? ?Today's Date: 05/07/2021 ?PT Individual Time: 1120-1200 ?PT Individual Time Calculation (min): 40 min  ? ?Patient has met 2 of 3 short term goals.  Pt is making good progress towards goals. Pt is currently min to modA for bed mobility due to decreased safety awareness however with appropriate sequencing can be consistently minA. Pt is modA for squat pivot transfers also due to decreased safety awareness however can be up to minA for stand pivot transfers. Pt has started gait training and can be mod to minA for gait with use of ace bandage for DF assist and demonstrates some quad activation to facilitate weight shifting to R. Pt's family has been present to assist with translation as well as receive education on pt's mobility and safe practice in preparation for d/c.  ? ?Patient continues to demonstrate the following deficits muscle weakness, muscle joint tightness, and muscle paralysis, decreased cardiorespiratoy endurance, and impaired timing and sequencing, abnormal tone, unbalanced muscle activation, and decreased coordination and therefore will continue to benefit from skilled PT intervention to increase functional independence with mobility. ? ?Patient progressing toward long term goals..  Continue plan of care. ? ?PT Short Term Goals ?Week 1:  PT Short Term Goal 1 (Week 1): Pt will initate gait training ?PT Short Term Goal 1 - Progress (Week 1): Met ?PT Short Term Goal 2 (Week 1): pt will propel w/c with min A or better ?PT Short Term Goal 2 - Progress (Week 1): Met ?PT Short Term Goal 3 (Week 1): Pt will perform squat pivot with min A or better ?PT Short Term Goal 3 - Progress (Week 1): Progressing toward goal ?Week 2:    ? ? ?   ? ?Therapy Documentation ?Precautions:   ?Precautions ?Precautions: Fall ?Restrictions ?Weight Bearing Restrictions: No ? ? ? ?Therapy/Group: Individual Therapy ? ?Rosita DeChalus ?05/07/2021, 3:41 PM  ?

## 2021-05-07 NOTE — Progress Notes (Signed)
Physical Therapy Session Note ? ?Patient Details  ?Name: Aaron Dennis ?MRN: [id number] ?Date of Birth: 13-Mar-1955 ? ?Today's Date: 05/07/2021 ?PT Individual Time: 1120-1200 and 9833-8250 ?PT Individual Time Calculation (min): 40 min and 80 min ? ?Short Term Goals: ?Week 1:  PT Short Term Goal 1 (Week 1): Pt will initate gait training ?PT Short Term Goal 2 (Week 1): pt will propel w/c with min A or better ?PT Short Term Goal 3 (Week 1): Pt will perform squat pivot with min A or better ? ?Skilled Therapeutic Interventions/Progress Updates: Pt presented in w/c agreeable to therapy. Pt denies pain but states some fatigue as participated in make up session shortly before PTA arrived. Session to focus on NMR and forced use of hemiparetic side. Pt transported to day room for energy conservation and performed squat pivot transfer to L with modA. Pt then participated in lateral leans x 5 for oblique activation and noted trace activation of L triceps when pushing from R side. Pt then participated in trunk rotation using RUE to reach left buttock rotating to L (reaching behind) then rotating to R (reaching in front). Pt performed standing TKE 2 x 10 for forced activation of quads with PTA providing tactile cues to maintain erect posture vs flexing forward.  Pt then performed Sit to stand with L hand supporting R hand with both placed on knee to promote weight shifting to R and forced use of RLE. Pt required modA for stand and max verbal cues for not moving LLE as pt tended to shift it back. Pt was able to perform x 3 then stating too fatigued to complete. Pt requesting to return to bed once in room. Performed stand pivot to R with modA to w/c. Pt transported back to room and performed stand pivot reaching onto bed rail for support to bed. Pt was able to perform sit to supine with minA for BLE management only. Pt left in bed at end of session with bed alarm on, call bell within reach and needs met.  ? ?Tx2: Pt presented in bed  agreeable to therapy. Pt denies pain during session. Performed supine to sit with modA and heavy use of bed features. With cues pt was able to push from sidelying to upright. Performed stand pivot with RW and minA. PTA donned ace wrap and shoes and transported to rehab gym. Performed stand pivot with pt lunging self to mat prior to properly aligning to mat throwing therapist off balance. PTA explained safest practice is to align up and attempt controlled descent for safety of self and caretakers. Pt verbalized understanding. Participated in gait training initially in gym. Pt ambulated ~65f with HW and modA as pt noted to have poor sequencing with HW and LLE, also not engaging quad on RLE when attempting to step with LLE, and increased adduction of RLE with step through. PTA then moved pt out to hallway and pt used green line to maintain wider BOS and to provide visual feedback to avoid adduction of RLE. Pt was able to ambulate 360fwith minA and moderate multimodal cues for sequencing. Pt was then turned around to ambulate 3018fack and turn into gym. Pt demonstrated improved sequencing until pt walked into gym with increased distractions. Pt then required modA for sequencing and noted significantly decreased safety with HW. Once pt arrived at mat participated in forced weight shifting to RLE by performing toe taps with LLE to 2in step 2 x 10. During first bout pt with decreased hip stabilization (  trendelenburg) to R with slight shoulder rotation to L. On second bout PTA facilitated stabilizing hips with noted intermittent posterior lean but improved posture and pt fully weight shifting to R. Pt then participated in tall kneeling activity requiring maxA to bring RLE onto mat then providing support on R side as pt lifted LLE onto mat. With use of bench on mat pt performed leaning to elbows for increased approximation of R shoulder girdle then pushing through hips to tall kneeing. This was done several times until  fatigue with pt noted increased R lean. Pt also also attempted sitting on heels to tall kneeing which pt was able to perform x 3 then stating unable to perform any more. Pt requesting to return to room due to fatigue however family and PTA encouraged pt to rest for a few then perform one last activity with pt agreeable. Pt performed stand pivot to w/c with minA and allowed extended therapeutic rest. Pt then transported to day room and PTA demonstrated NuStep. Pt performed stand pivot to NuStep with HW as before and participated at L2 x 6 min with RUE in hand splint. Pt was able to maintain 50-60 SPM for general conditioning and reciporcal activity. Pt stated he enjoyed using NuStep and would like to try again. Pt then transferred to w/c with HW and modA due to poor safety with transfer reaching for w/c and performing 180 to turn into chair. Pt transported back to room and requesting to remain in w/c. Pt left in w/c at end of session with belt alarm on, call bell within reach and needs met.  ?   ? ?Therapy Documentation ?Precautions:  ?Precautions ?Precautions: Fall ?Restrictions ?Weight Bearing Restrictions: No ?General: ?  ?Vital Signs: ?  ?Pain: ?Pain Assessment ?Pain Scale: 0-10 ?Pain Score: 0-No pain ?Mobility: ?  ?Locomotion : ?   ?Trunk/Postural Assessment : ?   ?Balance: ?  ?Exercises: ?  ?Other Treatments:   ? ? ? ?Therapy/Group: Individual Therapy ? ?Legacy Carrender ?05/07/2021, 1:21 PM  ?

## 2021-05-07 NOTE — Progress Notes (Signed)
Occupational Therapy Session Note ? ?Patient Details  ?Name: Aaron Dennis ?MRN: [id number] ?Date of Birth: Jan 10, 1956 ? ?Today's Date: 05/07/2021 ?OT Individual Time: [phone] ?OT Individual Time Calculation (min): 70 min  ? ? ?Short Term Goals: ?Week 1:  OT Short Term Goal 1 (Week 1): Patient will don UB clothing with good recall of hemi technique and Min A with no more than 2 verbal cues. ?OT Short Term Goal 2 (Week 1): Patient complete 1/3 parts of toileting task with Mod A and LRAD. ?OT Short Term Goal 3 (Week 1): Patient will don LB clothing with hemi technique and Mod A with LRAD. ?OT Short Term Goal 4 (Week 1): Patient will complete squat-pivot to [facility] with Min A. ?Week 2:    ? ?Skilled Therapeutic Interventions/Progress Updates:  ?  Patient completed BADL related task in bathing and dressing.  The pt was transported to the shower using the w/c with MaxA, he was able to transfer to the shower with the sliding board with [place].  The pt was able to remove his UB clothing items, an over head shirt with moderate vc's. He required ModA coming from sit to stand to remove his pants over his bottom and MaxA for removing the pants over his feet.  The pt was able to wash his face, chest, arms, pits, stomach and perineal area, and ULE with SBA only, he required MaxA for his bottom and BLE.  The pt was able to dry himself of and apply lotion to his entire body with the exception of his feet.  The pt required moderate verbal cues for dressing his UB in relation to coordination.  The pt was ModA for his LE dressing of pants for  threading them over his feet unilaterally.  The pt returned to his living quarters at w/c LOF and was able to brush his teeth and comb  his hair at sink LOF with s/u assist.  At the end of the session, the pt was positioned at chair LOF with his alarm in place, his alarm activated and his bedside table within reach.  The pt had no c/o pain with all additional needs addressed at the time of treatment.    ?Therapy Documentation ?Precautions:  ?Precautions ?Precautions: Fall ?Restrictions ?Weight Bearing Restrictions: No ?General: ?  ?Vital Signs: ?  ?Pain: ?Pain Assessment ?Pain Scale: 0-10 ?Pain Score: 0-No pain ?ADL: ?ADL ?Eating: Set up ?Grooming: Minimal assistance (Non-dominant LUE) ?Where Assessed-Grooming: Sitting at sink ?Upper Body Bathing: Moderate cueing ?Where Assessed-Upper Body Bathing: Edge of bed ?Lower Body Bathing: Moderate cueing ?Where Assessed-Lower Body Bathing: Edge of bed ?Upper Body Dressing: Moderate cueing ?Where Assessed-Upper Body Dressing: Edge of bed ?Lower Body Dressing: Moderate assistance ?Where Assessed-Lower Body Dressing: Edge of bed ?Toileting: Not assessed ?Toilet Transfer Method: Not assessed ?Tub/Shower Transfer: Not assessed ?Vision ?  ?Perception  ?  ?Praxis ?  ?Balance ?  ?Exercises: ?  ?Other Treatments:   ? ? ?Therapy/Group: Individual Therapy ? ?Yvonne Kendall ?05/07/2021, 12:15 PM ?

## 2021-05-07 NOTE — Progress Notes (Signed)
?                                                       PROGRESS NOTE ? ? ?Subjective/Complaints: ? ?Has intermittent HA's- associated with dizziness-  ?Tylenol helps.  ?Also R shoulder pain makes it difficult to sleep on R side- so sleeping only on back, which interrupts sleep- slept at 9pm, then woke for sleeping meds- then woke up at 1am and couldn't go back to sleep.  ?Likes therapy at 9am- and breakfast later like this AM.  ? ?ROS:  ? ?Pt denies SOB, abd pain, CP, N/V/C/D, and vision changes ? ? ?Objective: ?  ?No results found. ?Recent Labs  ?  05/05/21 ?0700  ?WBC 7.7  ?HGB 16.6  ?HCT 49.1  ?PLT 238  ? ? ?Recent Labs  ?  05/05/21 ?0700  ?NA 136  ?K 3.7  ?CL 106  ?CO2 23  ?GLUCOSE 91  ?BUN 15  ?CREATININE 0.89  ?CALCIUM 8.9  ? ? ?Intake/Output Summary (Last 24 hours) at 05/07/2021 0855 ?Last data filed at 05/07/2021 0544 ?Gross per 24 hour  ?Intake --  ?Output 200 ml  ?Net -200 ml  ?  ? ?  ? ?Physical Exam: ?Vital Signs ?Blood pressure 121/75, pulse 74, temperature 97.9 ?F (36.6 ?C), temperature source Oral, resp. rate 18, height '5\' 10"'$  (1.778 m), weight 77.4 kg, SpO2 99 %. ? ? ? ? ? ?General: awake, alert, appropriate, friend in room; (says only wife at home with him- thinks he needs longer here)- sitting u pin w/c at bedside; NAD ?HENT: conjugate gaze; oropharynx moist ?CV: regular rate; no JVD ?Pulmonary: CTA B/L; no W/R/R- good air movement ?GI: soft, NT, ND, (+)BS ?Psychiatric: appropriate- focused on staying longer ?Neurological: Ox3 ?Skin: ?   General: Skin is warm and dry.  ? ? MS: R shoulder - no subluxation felt with k tape in place today-stable- tape coming off somewhat much less TTP anterior and posterior this AM- looks better- but no change in strength- no change today ?Neurological:  ?   Mental Status: He is alert and oriented to person, place, and time.  ?   Comments: Speech is at baseline per pt= per son Arabic, primary language is good ?Intact to light touch x 4 extremities. Motor 5/5 LUE and  LLE. RUE 0/5. RLE 1+ to 2/5 prox to distal.  ? ? ?Assessment/Plan: ?1. Functional deficits which require 3+ hours per day of interdisciplinary therapy in a comprehensive inpatient rehab setting. ?Physiatrist is providing close team supervision and 24 hour management of active medical problems listed below. ?Physiatrist and rehab team continue to assess barriers to discharge/monitor patient progress toward functional and medical goals ? ?Care Tool: ? ?Bathing ? Bathing activity did not occur: Safety/medical concerns ?Body parts bathed by patient: Right arm, Chest, Front perineal area, Abdomen, Right upper leg, Left upper leg, Face  ? Body parts bathed by helper: Left arm, Buttocks, Left lower leg, Right lower leg ?  ?  ?Bathing assist Assist Level: Moderate Assistance - Patient 50 - 74% ?  ?  ?Upper Body Dressing/Undressing ?Upper body dressing Upper body dressing/undressing activity did not occur (including orthotics): Safety/medical concerns ?What is the patient wearing?: Pull over shirt ?   ?Upper body assist Assist Level: Moderate Assistance - Patient 50 - 74% ?   ?Lower  Body Dressing/Undressing ?Lower body dressing ? ? ? Lower body dressing activity did not occur: Safety/medical concerns ?What is the patient wearing?: Pants, Underwear/pull up ? ?  ? ?Lower body assist Assist for lower body dressing: Maximal Assistance - Patient 25 - 49% ?   ? ?Toileting ?Toileting Toileting Activity did not occur (Probation officer and hygiene only): N/A (no void or bm)  ?Toileting assist Assist for toileting: Total Assistance - Patient < 25% ?  ?  ?Transfers ?Chair/bed transfer ? ?Transfers assist ? Chair/bed transfer activity did not occur: Safety/medical concerns ? ?Chair/bed transfer assist level: Moderate Assistance - Patient 50 - 74% ?  ?  ?Locomotion ?Ambulation ? ? ?Ambulation assist ? ? Ambulation activity did not occur: Safety/medical concerns ? ?Assist level: Moderate Assistance - Patient 50 - 74% ?Assistive device:  Other (comment) (hand rail) ?Max distance: 30  ? ?Walk 10 feet activity ? ? ?Assist ? Walk 10 feet activity did not occur: Safety/medical concerns ? ?Assist level: Moderate Assistance - Patient - 50 - 74% ?Assistive device: Other (comment) (hand rail)  ? ?Walk 50 feet activity ? ? ?Assist Walk 50 feet with 2 turns activity did not occur: Safety/medical concerns ? ?  ?   ? ? ?Walk 150 feet activity ? ? ?Assist Walk 150 feet activity did not occur: Safety/medical concerns ? ?  ?  ?  ? ?Walk 10 feet on uneven surface  ?activity ? ? ?Assist Walk 10 feet on uneven surfaces activity did not occur: Safety/medical concerns ? ? ?  ?   ? ?Wheelchair ? ? ? ? ?Assist Is the patient using a wheelchair?: Yes ?Type of Wheelchair: Manual ?  ? ?Wheelchair assist level: Minimal Assistance - Patient > 75% ?Max wheelchair distance: 20  ? ? ?Wheelchair 50 feet with 2 turns activity ? ? ? ?Assist ? ?  ?  ? ? ?   ? ?Wheelchair 150 feet activity  ? ? ? ?Assist ?   ? ? ?   ? ?Blood pressure 121/75, pulse 74, temperature 97.9 ?F (36.6 ?C), temperature source Oral, resp. rate 18, height '5\' 10"'$  (1.778 m), weight 77.4 kg, SpO2 99 %. ? ?Medical Problem List and Plan: ?1. Functional deficits secondary to acute left frontoparietal infarct ?            -patient may  shower ?            -ELOS/Goals: 7-10 days supervision ? D/c date 4/11 ? Continue CIR- PT, OT - pt says only wife to help him at home and has bad knee- son works- however explained acute rehab is exactly that- and cannot keep for more than ~ 3 weeks.  ?2.  Antithrombotics: ?-DVT/anticoagulation:  Pharmaceutical: Lovenox ?            -antiplatelet therapy: Plavix and aspirin for 3 weeks then aspirin alone ?3. Pain Management: Tylenol ? 3/29- schedule tylenol at 8pm for R shoulder pain- wait on steroid injection of R shoulder for now; also wait on prevention meds for HA's- suggest pt ask for tylenol prn for now ?4. Mood: LCSW to evaluate and provide emotional support ? 3/23- pt c/o  feeling depressed over stroke- will start Celexa 20 mg daily. ?3/27- improved mood with celexa- con't regimen   ?            -antipsychotic agents: Not applicable ?5. Neuropsych: This patient is capable of making decisions on his own behalf. ?6. Skin/Wound Care: Routine skin care checks ?    ?  7. Fluids/Electrolytes/Nutrition: Routine I's and O's and follow-up chemistries ?8: Cerebral aneurysm: follow-up IR as outpatient ?9: Hyperlipidemia:: continue Crestor 20 mg daily ?10: Essential hypertension: Continue amlodipine 5 mg daily ?-- Proscar 5 mg daily ?3/28- BP great control- con't regimen  ?11: Polycythemia: Follow-up medical oncology as outpatient ?12: GI prophylaxis: Continue Protonix ?13: BPH: Continue Flomax ? 3/25- peeing well- con't flomax ?14. Constipation- LBM 3 days ago- will give miralax prn ?3/22- will give miralax x1 this AM- if doesn't work, will give sorbitol tomorrow  ?3/27- going "regularly" per pt- con't regimen ? 15/ hypokalemia ? 3/22- will replete 40 mEq x1 and recheck  ?3/27- K+ 3.7- con't regimen off K+. ?16. Insomnia ?3/28- Will try Low dose Trazodone and monitor for urinary retention- 50 mg QHS-   ? 3/29- will change trazodone to 100 mg QHS and give at [place] [facility].  ?17. Dispo3/22- 1 family member can stay overnight. ?3/29- d/c date set for 4/11-  ? ? ?  ?I spent a total of   35  minutes on total care today- >50% coordination of care- due to d/w pt and friend about plan and SW as well ? ? ? ? ?LOS: ?8 days ?A FACE TO FACE EVALUATION WAS PERFORMED ? ?Cheree Fowles ?05/07/2021, 8:55 AM  ? ? ? ?

## 2021-05-07 NOTE — Progress Notes (Signed)
Physical Therapy Session Note ? ?Patient Details  ?Name: Aaron Dennis ?MRN: [id number] ?Date of Birth: 07-03-1955 ? ?Today's Date: 05/07/2021 ?PT Individual Time: 1030-1110 ?PT Individual Time Calculation (min): 40 min  ? ?Short Term Goals: ?Week 1:  PT Short Term Goal 1 (Week 1): Pt will initate gait training ?PT Short Term Goal 2 (Week 1): pt will propel w/c with min A or better ?PT Short Term Goal 3 (Week 1): Pt will perform squat pivot with min A or better ? ?Skilled Therapeutic Interventions/Progress Updates:  ?  Patient received sitting up in wc, family present, agreeable to make up therapy time. He denies pain. Brother reporting for patient that patient preferred DF wrap to AFO. PT transporting patient in wc to therapy gym for time management and energy conservation. Applied DF wrap to R LE. He ambulated 32f x2 with HW + MinA/ModA. Intermittent assist needed to R LE to assure appropriate abduction to minimize NBOS. Patient with excessive R knee flexion in stance with limited ability to achieve full extension. Also with forward flexed trunk during gait requiring increasing assist with fatigue. Patient ambulating additional 273fwith no assist to advance R LE or assure appropriate abduction. Much slower gait speed and shorter R step length noted without PT assist. Patient returning to room in wc, seatbelt alarm on, call light within reach.  ? ?Therapy Documentation ?Precautions:  ?Precautions ?Precautions: Fall ?Restrictions ?Weight Bearing Restrictions: No ? ? ? ? ?Therapy/Group: Individual Therapy ? ?JeDebbora DusJeDebbora DusPT, DPT, CBIS ? ?05/07/2021, 11:15 AM  ?

## 2021-05-08 MED ORDER — MUSCLE RUB 10-15 % EX CREA
TOPICAL_CREAM | CUTANEOUS | Status: DC | PRN
  Filled 2021-05-08: qty 85

## 2021-05-08 NOTE — Progress Notes (Signed)
Physical Therapy Session Note ? ?Patient Details  ?Name: Aaron Dennis ?MRN: [id number] ?Date of Birth: 12-Apr-1955 ? ?Today's Date: 05/08/2021 ?PT Individual Time: 1660-6004 ?PT Individual Time Calculation (min): 45 min  ? ?Short Term Goals: ?Week 1:  PT Short Term Goal 1 (Week 1): Pt will initate gait training ?PT Short Term Goal 1 - Progress (Week 1): Met ?PT Short Term Goal 2 (Week 1): pt will propel w/c with min A or better ?PT Short Term Goal 2 - Progress (Week 1): Met ?PT Short Term Goal 3 (Week 1): Pt will perform squat pivot with min A or better ?PT Short Term Goal 3 - Progress (Week 1): Progressing toward goal ?Week 2:  PT Short Term Goal 1 (Week 2): Pt will ambulate 55f with min assist consistently ?PT Short Term Goal 2 (Week 2): Pt will propell WC >1569fwith supevision assist ?PT Short Term Goal 3 (Week 2): Pt will trasnfer to and from bed with min assist consistently ?PT Short Term Goal 4 (Week 2): Pt wil ascend 3 steps with mod assist and LRAD ?Week 3:    ? ?Skilled Therapeutic Interventions/Progress Updates:  ? ?Pt received sitting in WC and agreeable to PT ? ?Kinetron.  4 bouts x 2 min each with mod assist from PT from PT to improved ROM on the RLE and then sustain grasp and forward reach with the RUE to prevent posterior pushing while in WC.  ? ?Sitting 96/68 HR 96 ?Standing 95/74 HR 118 ?Standing 108/79 HR 118 ?Sitting. 109/76 HR 94 ?Pt asymptomatic throughout  ? ?Pregait stepping over 1.5 inch obstacle x 10 BLE with LUE supported on the RW. Min assist from PT to improve step height on the RLE.  ? ?Gait training with HW x 4072fnd 34f58fth min assist from PT with cues for improved step length and tactile cues from PT to improve terminal knee extension on the RLE.  ? ?Patient returned to room and left sitting in [facility] call bell in reach and all needs met.   ? ? ?   ? ?Therapy Documentation ?Precautions:  ?Precautions ?Precautions: Fall ?Restrictions ?Weight Bearing Restrictions: No ? ?  ?Vital  Signs: ?Therapy Vitals ?Temp: 97.8 ?F (36.6 ?C) ?Pulse Rate: 92 ?Resp: 15 ?BP: 106/60 ?Patient Position (if appropriate): Sitting ?Oxygen Therapy ?SpO2: 100 % ?O2 Device: Room Air ?Pain: ?Pain Assessment ?Pain Scale: 0-10 ?Pain Score: 0-No pain ? ? ?Therapy/Group: Individual Therapy ? ?AustLorie Phenix30/2023, 5:53 PM  ?

## 2021-05-08 NOTE — Progress Notes (Signed)
Physical Therapy Session Note ? ?Patient Details  ?Name: Aaron Dennis ?MRN: [id number] ?Date of Birth: 05/20/1955 ? ?Today's Date: 05/08/2021 ?PT Individual Time: 7510-2585 ?PT Individual Time Calculation (min): 88 min  ? ?Short Term Goals: ?Week 2:    ? ?Skilled Therapeutic Interventions/Progress Updates: Pt presented in w/c with friends/family present agreeable to therapy. Pt denies pain at start of session. Pt transported to day room for energy conservation. Performed squat pivot transfer to high/low mat with modA. Participated in block practice scooting with emphasis on increasing anterior lean for improved clearance of hips. Pt initially requring mod multimodal cues however improved on both L and R to CGA and fair clearance of buttock. Participated in block practice of Sit to stand without AD but with mirror feedback 2 x 5. PTA blocking R knee however PTA noted improved midline orientation from yesterday and improved quad activation. Participated in standing balance actiivty of placing cards on board. Pt was able to perform reaching to R with LUE however with time increased knee flexion with pt unaware but was able to correct with multimodal cues. Pt with one episode of LOB requiring assist to EOM. When PTA inquired pt stating head felt "heavy". Vitals assessed WNL 112/74 (86) HR 89. PTA then setup RLE with ace bandage DF assist and shoe cover. Pt ambulated 6f with minA becoming progressively modA with increased sway and poor sequencing. Pt encouraged to sit with pt stating head felt "heavy" again. BP checked 97/77 (84) HR 108. After extended rest pt stating felt better and was able to ambulate an additional 123fwith improved technique and sequencing. Pt then ambulated ~383fith minA. Pt required verbal cues for adducting RLE, with verbal cues pt able to perform TKE but due to weakness genu recurvatum noted with intermittent external rotation.  Pt was able to turn to L with HW and step backwards to mat to sit  with improved control and decreased cues! Pt then performed squat pivot to w/c with minA and improved control. Pt handed off to [place] next session with current needs met.  ?   ? ?Therapy Documentation ?Precautions:  ?Precautions ?Precautions: Fall ?Restrictions ?Weight Bearing Restrictions: No ? ? ? ?Therapy/Group: Individual Therapy ? ?Mazal Ebey ?05/08/2021, 12:40 PM  ?

## 2021-05-08 NOTE — Progress Notes (Signed)
Patient ID: KARON HECKENDORN, male   DOB: 1955-03-13, 66 y.o.   MRN: [id number]  Pt wanted to speak with this worker regarding getting insurance for his wife so she can be seen by a specialist and have something done for her bad knees. Discussed applying for medicaid he reports they did she is not eligible for this. Informed him I work for the hospital and do not make determination for Medicaid and who receives what. He would need to go to Social Services to discuss with them along with his questions regarding his lost food stamp card. He reports he can;t since here and asked if son could take him or go for him. He reports he works. Pt became dismissive then and said good day. Pt does not seem to understand that he is eligible for insurance but his wife is not but she is a not a citizen here but he is. Informed him I have no influence regarding eligibility for Medicaid or other services. ?

## 2021-05-08 NOTE — Progress Notes (Signed)
Occupational Therapy Session Note ? ?Patient Details  ?Name: Aaron Dennis ?MRN: [id number] ?Date of Birth: Jun 14, 1955 ? ?Today's Date: 05/08/2021 ?OT Individual Time: 9292-4462 ?OT Individual Time Calculation (min): 67 min  ? ? ?Short Term Goals: ?Week 1:  OT Short Term Goal 1 (Week 1): Patient will don UB clothing with good recall of hemi technique and Min A with no more than 2 verbal cues. ?OT Short Term Goal 2 (Week 1): Patient complete 1/3 parts of toileting task with Mod A and LRAD. ?OT Short Term Goal 3 (Week 1): Patient will don LB clothing with hemi technique and Mod A with LRAD. ?OT Short Term Goal 4 (Week 1): Patient will complete squat-pivot to [facility] with Min A. ?Week 2:    ? ?Skilled Therapeutic Interventions/Progress Updates:  ? The pt c/o R shld pain, 6 on a 0-10 scale. The patient's R shld pain was addressed with kinesio tape and positioning.  The following NMR of the extremity with focus on scapular mobility for proximal communication, the pt tolerated the tapping method and was able to demonstrate effective carryover,squeezing a low resistant sponge for a count of 5 with rest breaks as needed, the pt required 1 rest break.  The pt complete UB theraex using a 3lb dumb bell for bicep curl, shld flexion, and horizontal abduction 2 sets of 20.  The pt went on to complete resistive trunk exercise to improve his UB strength and endurance for functional task completion.  The pt ended the session with simulated task in UB dressing to improve his independence in this area of function.  The pt remained at w/c LOF with his bedside table in place, his alarm activated, and his call light within reach.  Family was present throughout the treatment session with no c/o at the close of the session.   ? ?Therapy Documentation ?Precautions:  ?Precautions ?Precautions: Fall ?Restrictions ?Weight Bearing Restrictions: No ?General: ?  ?Vital Signs: ?  ?Pain: ?  ?ADL: ?ADL ?Eating: Set up ?Grooming: Minimal assistance  (Non-dominant LUE) ?Where Assessed-Grooming: Sitting at sink ?Upper Body Bathing: Moderate cueing ?Where Assessed-Upper Body Bathing: Edge of bed ?Lower Body Bathing: Moderate cueing ?Where Assessed-Lower Body Bathing: Edge of bed ?Upper Body Dressing: Moderate cueing ?Where Assessed-Upper Body Dressing: Edge of bed ?Lower Body Dressing: Moderate assistance ?Where Assessed-Lower Body Dressing: Edge of bed ?Toileting: Not assessed ?Toilet Transfer Method: Not assessed ?Tub/Shower Transfer: Not assessed ?Vision ?  ?Perception  ?  ?Praxis ?  ?Balance ?  ?Exercises: ?  ?Other Treatments:   ? ? ?Therapy/Group: Individual Therapy ? ?Yvonne Kendall ?05/08/2021, 12:33 PM ?

## 2021-05-08 NOTE — Progress Notes (Signed)
Patient did not want to take miralax in the evening. Accepted prune juice and said he will take miralax in the morning  ?

## 2021-05-08 NOTE — Progress Notes (Signed)
?                                                       PROGRESS NOTE ? ? ?Subjective/Complaints: ? ?Pt reports easier to sleep on R shoulder with tylenol ?Also woke up at 1am, but back to sleep until 4am- slept better.  ?Good today overall.  ? ? ?ROS:  ? ?Pt denies SOB, abd pain, CP, N/V/C/D, and vision changes ? ?Objective: ?  ?No results found. ?No results for input(s): WBC, HGB, HCT, PLT in the last 72 hours. ? ? ?No results for input(s): NA, K, CL, CO2, GLUCOSE, BUN, CREATININE, CALCIUM in the last 72 hours. ? ? ?Intake/Output Summary (Last 24 hours) at 05/08/2021 1013 ?Last data filed at 05/08/2021 [phone] ?Gross per 24 hour  ?Intake 120 ml  ?Output 400 ml  ?Net -280 ml  ?  ? ?  ? ?Physical Exam: ?Vital Signs ?Blood pressure 121/78, pulse 70, temperature 97.6 ?F (36.4 ?C), resp. rate 18, height '5\' 10"'$  (1.778 m), weight 76.7 kg, SpO2 99 %. ? ? ? ? ? ? ?General: awake, alert, appropriate, sitting up in w/c at bedside; alone this AM; NAD ?HENT: conjugate gaze; oropharynx moist ?CV: regular rate; no JVD ?Pulmonary: CTA B/L; no W/R/R- good air movement ?GI: soft, NT, ND, (+)BS ?Psychiatric: appropriate ?Neurological: Ox3 ?Skin: ?   General: Skin is warm and dry.  ? ? MS: R shoulder - no subluxation felt with k tape in place today-stable- tape coming off somewhat much less TTP anterior and posterior this AM-less TTP- no change in no sublux with taping ?Neurological:  ?   Mental Status: He is alert and oriented to person, place, and time.  ?   Comments: Speech is at baseline per pt= per son Arabic, primary language is good ?Intact to light touch x 4 extremities. Motor 5/5 LUE and LLE. RUE 0/5. RLE 1+ to 2/5 prox to distal.  ? ? ?Assessment/Plan: ?1. Functional deficits which require 3+ hours per day of interdisciplinary therapy in a comprehensive inpatient rehab setting. ?Physiatrist is providing close team supervision and 24 hour management of active medical problems listed below. ?Physiatrist and rehab team continue to  assess barriers to discharge/monitor patient progress toward functional and medical goals ? ?Care Tool: ? ?Bathing ? Bathing activity did not occur: Safety/medical concerns ?Body parts bathed by patient: Right arm, Chest, Front perineal area, Abdomen, Right upper leg, Left upper leg, Face  ? Body parts bathed by helper: Left arm, Buttocks, Left lower leg, Right lower leg ?  ?  ?Bathing assist Assist Level: Moderate Assistance - Patient 50 - 74% ?  ?  ?Upper Body Dressing/Undressing ?Upper body dressing Upper body dressing/undressing activity did not occur (including orthotics): Safety/medical concerns ?What is the patient wearing?: Pull over shirt ?   ?Upper body assist Assist Level: Moderate Assistance - Patient 50 - 74% ?   ?Lower Body Dressing/Undressing ?Lower body dressing ? ? ? Lower body dressing activity did not occur: Safety/medical concerns ?What is the patient wearing?: Pants, Underwear/pull up ? ?  ? ?Lower body assist Assist for lower body dressing: Maximal Assistance - Patient 25 - 49% ?   ? ?Toileting ?Toileting Toileting Activity did not occur (Probation officer and hygiene only): N/A (no void or bm)  ?Toileting assist Assist for toileting: Total Assistance -  Patient < 25% ?  ?  ?Transfers ?Chair/bed transfer ? ?Transfers assist ? Chair/bed transfer activity did not occur: Safety/medical concerns ? ?Chair/bed transfer assist level: Moderate Assistance - Patient 50 - 74% ?  ?  ?Locomotion ?Ambulation ? ? ?Ambulation assist ? ? Ambulation activity did not occur: Safety/medical concerns ? ?Assist level: Moderate Assistance - Patient 50 - 74% ?Assistive device: Other (comment) (hand rail) ?Max distance: 30  ? ?Walk 10 feet activity ? ? ?Assist ? Walk 10 feet activity did not occur: Safety/medical concerns ? ?Assist level: Moderate Assistance - Patient - 50 - 74% ?Assistive device: Other (comment) (hand rail)  ? ?Walk 50 feet activity ? ? ?Assist Walk 50 feet with 2 turns activity did not occur:  Safety/medical concerns ? ?  ?   ? ? ?Walk 150 feet activity ? ? ?Assist Walk 150 feet activity did not occur: Safety/medical concerns ? ?  ?  ?  ? ?Walk 10 feet on uneven surface  ?activity ? ? ?Assist Walk 10 feet on uneven surfaces activity did not occur: Safety/medical concerns ? ? ?  ?   ? ?Wheelchair ? ? ? ? ?Assist Is the patient using a wheelchair?: Yes ?Type of Wheelchair: Manual ?  ? ?Wheelchair assist level: Minimal Assistance - Patient > 75% ?Max wheelchair distance: 20  ? ? ?Wheelchair 50 feet with 2 turns activity ? ? ? ?Assist ? ?  ?  ? ? ?   ? ?Wheelchair 150 feet activity  ? ? ? ?Assist ?   ? ? ?   ? ?Blood pressure 121/78, pulse 70, temperature 97.6 ?F (36.4 ?C), resp. rate 18, height '5\' 10"'$  (1.778 m), weight 76.7 kg, SpO2 99 %. ? ?Medical Problem List and Plan: ?1. Functional deficits secondary to acute left frontoparietal infarct ?            -patient may  shower ?            -ELOS/Goals: 7-10 days supervision ? D/c date 4/11 ? Con't CIR- PT and OT-  ?2.  Antithrombotics: ?-DVT/anticoagulation:  Pharmaceutical: Lovenox ?            -antiplatelet therapy: Plavix and aspirin for 3 weeks then aspirin alone ?3. Pain Management: Tylenol ? 3/29- schedule tylenol at 8pm for R shoulder pain- wait on steroid injection of R shoulder for now; also wait on prevention meds for HA's- suggest pt ask for tylenol prn for now ? 3/30- pain in R shoulder better last night- con't regimen ?4. Mood: LCSW to evaluate and provide emotional support ? 3/23- pt c/o feeling depressed over stroke- will start Celexa 20 mg daily. ?3/27- improved mood with celexa- con't regimen   ?            -antipsychotic agents: Not applicable ?5. Neuropsych: This patient is capable of making decisions on his own behalf. ?6. Skin/Wound Care: Routine skin care checks ?    ?7. Fluids/Electrolytes/Nutrition: Routine I's and O's and follow-up chemistries ?8: Cerebral aneurysm: follow-up IR as outpatient ?9: Hyperlipidemia:: continue Crestor 20 mg  daily ?10: Essential hypertension: Continue amlodipine 5 mg daily ?-- Proscar 5 mg daily ?3/30- BP great control- con't regimen ?11: Polycythemia: Follow-up medical oncology as outpatient ?12: GI prophylaxis: Continue Protonix ?13: BPH: Continue Flomax ? 3/25- peeing well- con't flomax ?14. Constipation- LBM 3 days ago- will give miralax prn ?3/22- will give miralax x1 this AM- if doesn't work, will give sorbitol tomorrow  ?3/30- going daily per pt- con't regimen ?  15/ hypokalemia ? 3/22- will replete 40 mEq x1 and recheck  ?3/27- K+ 3.7- con't regimen off K+. ?16. Insomnia ?3/28- Will try Low dose Trazodone and monitor for urinary retention- 50 mg QHS-   ? 3/29- will change trazodone to 100 mg QHS and give at [place] well.  ? 3/30- slept better- cont' regimen ?17. Dispo3/22- 1 family member can stay overnight. ?3/29- d/c date set for 4/11-  ? ? ?  ? ? ? ? ? ?LOS: ?9 days ?A FACE TO FACE EVALUATION WAS PERFORMED ? ?Aaron Dennis ?05/08/2021, 10:13 AM  ? ? ? ?

## 2021-05-09 DIAGNOSIS — Z8673 Personal history of transient ischemic attack (TIA), and cerebral infarction without residual deficits: Secondary | ICD-10-CM

## 2021-05-09 NOTE — Progress Notes (Signed)
Occupational Therapy Session Note ? ?Patient Details  ?Name: Aaron Dennis ?MRN: [id number] ?Date of Birth: [birth year] ? ?Today's Date: 05/09/2021 ?OT Individual Time: 9833-8250 ?OT Individual Time Calculation (min): 65 min  ? ? ?Short Term Goals: ?Week 1:  OT Short Term Goal 1 (Week 1): Patient will don UB clothing with good recall of hemi technique and Min A with no more than 2 verbal cues. ?OT Short Term Goal 1 - Progress (Week 1): Progressing toward goal ?OT Short Term Goal 2 (Week 1): Patient complete 1/3 parts of toileting task with Mod A and LRAD. ?OT Short Term Goal 2 - Progress (Week 1): Met ?OT Short Term Goal 3 (Week 1): Patient will don LB clothing with hemi technique and Mod A with LRAD. ?OT Short Term Goal 3 - Progress (Week 1): Met ?OT Short Term Goal 4 (Week 1): Patient will complete squat-pivot to [facility] with Min A. ?OT Short Term Goal 4 - Progress (Week 1): Progressing toward goal ? ?Skilled Therapeutic Interventions/Progress Updates:  ?  Pt seen this session to focus on RUE NMR and transfer skills. Pt taken to gym via wc and worked on forced use of RLE (placed R hand in pants pocket to support arm) with squat pivot transfers from wc to mat and 4x to L on mat and 4x to R on mat.  Cues to focus on forward lean vs pushing up.  Moving to his R side was only CGA and to the L min A.   ?RUE NMR with table top slides, activating biceps by knocking cones off table with elb flexion in gravity eliminated.  A/arom for sh flex and ext.   ?A/arom for fingers with towel crunches and pt today demonstrated finger flexion!  Progressed to having him use isometric grasp to hold cones.  Used bar of stool to have him grasp onto with some support as he worked on active sh flex and ext.   ?B grasp of picking up laundry basket for hand over hand A support to RUE.   ?Pt excited to see his fingers move.  ?Pt completed sq pivot back to wc with min A.  Pt resting in wc with several family members present.   ? ?Therapy  Documentation ?Precautions:  ?Precautions ?Precautions: Fall ?Restrictions ?Weight Bearing Restrictions: No ? ?  ?Vital Signs: ?Therapy Vitals ?Temp: (!) 97.5 ?F (36.4 ?C) ?Temp Source: Oral ?Pulse Rate: 87 ?Resp: 17 ?BP: 124/62 ?Patient Position (if appropriate): Sitting ?Oxygen Therapy ?SpO2: 100 % ?O2 Device: Room Air ?Pain: ?Pain Assessment ?Pain Score: 0-No pain ? ? ? ?Therapy/Group: Individual Therapy ? ?[place] ?05/09/2021, 4:33 PM ?

## 2021-05-09 NOTE — Progress Notes (Signed)
Occupational Therapy Weekly Progress Note ? ?Patient Details  ?Name: Aaron Dennis ?MRN: [id number] ?Date of Birth: August 12, 1955 ? ?Beginning of progress report period: 05/02/21 ?End of progress report period: 05/09/21 ? ?Today's Date: 05/09/2021 ?OT Individual Time: 8413-2440 ?OT Individual Time Calculation (min): 54 min  ? ? ?Patient has met 2 of 4 short term goals.  Aaron Dennis is progressing toward his OT goals, requiring min/mod A for squat pivot transfers, mod A for UB/LB dressing. His RUE has progressed with volitional movement and he is at a [place] II, progressing from a I. He has full bicep AROM in a gravity eliminated movement, as well as activation in all other joints, progressing to movement. Family education will be scheduled closer to d/c.  ? ?Patient continues to demonstrate the following deficits: muscle weakness, unbalanced muscle activation, decreased coordination, and decreased motor planning, decreased attention to right, decreased awareness, decreased problem solving, decreased safety awareness, decreased memory, and delayed processing, and decreased sitting balance, decreased standing balance, decreased postural control, hemiplegia, and decreased balance strategies and therefore will continue to benefit from skilled OT intervention to enhance overall performance with BADL. ? ?Patient progressing toward long term goals..  Continue plan of care. ? ?OT Short Term Goals ?Week 1:  OT Short Term Goal 1 (Week 1): Patient will don UB clothing with good recall of hemi technique and Min A with no more than 2 verbal cues. ?OT Short Term Goal 1 - Progress (Week 1): Progressing toward goal ?OT Short Term Goal 2 (Week 1): Patient complete 1/3 parts of toileting task with Mod A and LRAD. ?OT Short Term Goal 2 - Progress (Week 1): Met ?OT Short Term Goal 3 (Week 1): Patient will don LB clothing with hemi technique and Mod A with LRAD. ?OT Short Term Goal 3 - Progress (Week 1): Met ?OT Short Term Goal 4 (Week 1): Patient  will complete squat-pivot to [facility] with Min A. ?OT Short Term Goal 4 - Progress (Week 1): Progressing toward goal ?Week 2:  OT Short Term Goal 1 (Week 2): Pt will don LB clothing with min A overall ?OT Short Term Goal 2 (Week 2): Pt will complete transfer to [facility] with min A ?OT Short Term Goal 3 (Week 2): Pt will complete UB dressing with min A, no more than 2 cues for hemi technique ? ? ?Skilled Therapeutic Interventions/Progress Updates:  ?  Pt received sitting up in the w/c with no c/o pain, agreeable to OT session. He was upset re long wait time to get OOB with nursing this morning, provided emotional support. Pt completed grooming at the sink with set up assist. He required set up assist to eat breakfast quickly. He was taken to the therapy gym via w/c. He completed a squat pivot transfer to the mat with min A. Focused on RUE NMR throughout session. Worked on gravity eliminated elbow flex/ext- pt has 90% full bicep range in this position, 15% tricep range. Saebo used in conjunction with hands on assist. He then completed shoulder flex/ext in gravity eliminated- with PROM scapula has good [place] rhythm but little activation volitionally. Next worked on standing level side stepping- pt requiring mod A facilitation at the R knee and pt very self limiting by being frustrated by needing assist- often sititng down and stating "I just can't". Pt returned to his room and was left sitting up with all needs met, chair alarm set.  ? ? ?Saebo Stim One placed on pt's bicep and tricep for AAROM muscle activation  with great results. Aaron Dennis was left unattended on pt's posterior deltoid to facilitate [place] joint stability and reduce shoulder pain. First 15 min attended to ensure compliance and no pain, and then 45 min unattended e-stim. No c/o pain or impaired skin integrity.  ?330 pulse width ?35 Hz pulse rate ?On 8 sec/ off 8 sec ?Ramp up/ down 2 sec ?Symmetrical Biphasic wave form  ?Max intensity 132m at 500 Ohm load ? ? ? ?Therapy  Documentation ?Precautions:  ?Precautions ?Precautions: Fall ?Restrictions ?Weight Bearing Restrictions: No ? ? ?Therapy/Group: Individual Therapy ? ?SCurtis Sites?05/09/2021, 12:47 PM / ?

## 2021-05-09 NOTE — Plan of Care (Signed)
?  Problem: RH BOWEL ELIMINATION ?Goal: RH STG MANAGE BOWEL WITH ASSISTANCE ?Description: STG Manage Bowel with Supervision Assistance. ?Outcome: Not Progressing; LBM 3/27; follow up on laxatives ?  ?

## 2021-05-09 NOTE — Progress Notes (Signed)
Physical Therapy Session Note ? ?Patient Details  ?Name: Aaron Dennis ?MRN: [id number] ?Date of Birth: 1955/04/24 ? ?Today's Date: 05/09/2021 ?PT Individual Time: 1638-4536 ?PT Individual Time Calculation (min): 59 min  ? ?Short Term Goals: ?Week 2:  PT Short Term Goal 1 (Week 2): Pt will ambulate 43f with min assist consistently ?PT Short Term Goal 2 (Week 2): Pt will propell WC >1545fwith supevision assist ?PT Short Term Goal 3 (Week 2): Pt will trasnfer to and from bed with min assist consistently ?PT Short Term Goal 4 (Week 2): Pt wil ascend 3 steps with mod assist and LRAD ? ?Skilled Therapeutic Interventions/Progress Updates: Pt presents sitting in w/c and eating lunch.  PT returned in 10 minutes for pt to complete lunch.  Pt agreeable to therapy.  When returned, Pt starting to go to BR w/ NT, so handed off to PT.  B shoes donned w/ total A.  Pt wheeled into BR to toilet w/ commode over.  Pt transferred sit to stand w/ min A, does need occasional cues for proper R foot placement before standing, and then reaches for hand rail and able to perform stepping around to toilet.  Pt sat on toilet before doffing pants.  Pt stood w/ min A and then w/ min to mod A for balance, pt able to manage clothing.   Pt continent of bowel and bladder in toilet, nursing notified.  Pt transfers sit to stand w/ min A and stood for total A for pericare.  Pt required mod A for initiation to pull up pants.  Pt performed squat pivot toilet to w/c w/ min to mod, requiring verbal cues but not a safe transfer, moving impulsively.  Pt wheeled to main gym for time conservation.  Ace wrap applied for improved DF assist, but increasing DF strength noted.  Pt amb approx. 6068w/ HW and min A, verbal cues x 30% for sequencing especially for weight shift to L for advancement of RLE.  Pt w/ no episodes of scissoring even w/ U-turns to right around cone and back to mat table.  Pt performed standing toe tap on RLE to 5" platform and board between feet  to maintain adequate BOS.  Pt able to perform w/ increased knee/hip flexion as well as DF, not circumduction.  Pt returned to room and remained sitting in w/c for next OT session, chair alarm on and all needs in reach.  Family members present. ?   ? ?Therapy Documentation ?Precautions:  ?Precautions ?Precautions: Fall ?Restrictions ?Weight Bearing Restrictions: No ?General: ?  ?Vital Signs: ?Therapy Vitals ?Temp: 97.9 ?F (36.6 ?C) ?Pulse Rate: 82 ?Resp: 16 ?BP: 103/73 ?Patient Position (if appropriate): Sitting ?Oxygen Therapy ?SpO2: 98 % ?O2 Device: Room Air ?Pain:0/10 ?  ? ? ? ?Therapy/Group: Individual Therapy ? ?[name]3/31/2023, 2:18 PM  ?

## 2021-05-09 NOTE — Consult Note (Signed)
?Neuropsychological Consultation ? ? ?Patient:   Aaron Dennis  ? ?DOB:   08-15-1955 ? ?MR Number:  [id number] ? ?Location:  [place] ?[address] ?[id number] [facility] ?[place] [address] ?Dept: [phone] ?Loc: [phone] ?          ?Date of Service:   05/09/2021 ? ?Start Time:   9 AM ?End Time:   10 AM ? ?Provider/Observer:  Ilean Skill, Psy.D.   ?    Clinical Neuropsychologist ?     ? ?Billing Code/Service: [id number] ? ?Chief Complaint:    Aaron Dennis is a 66 year old male who presented to the emergency department at [facility] on 04/27/2021 with reports of right-sided weakness that he reported had been going on the prior several days.  Patient reports that he was traveling from the [place] to the [place] and began having symptoms related to paresthesias on his right arm prior to beginning his travels and saw Dr. There but they remitted and began having presentation of new symptoms intermittently during his long flight to [place].  They began to worsen as he made his way to [place] to [facility] with at 1 point during his change overs airline requesting that he stop and see a specialist but patient had no family or support any continued on his travels.  Imaging work-up included head CT scan which was negative.  MRI of brain and lower extremities revealed acute infarction of the left frontoparietal lobe with associated edema without mass effect.  Patient started on aspirin and Plavix as he was outside the window for any other type of acute intervention.  4 x 3 cerebral aneurysm was also noted during work-up with recommendations to follow-up with IR as an outpatient.  Patient with continued motor deficits with improvement in motor deficits for his right leg but ongoing severe and profound motor deficits for his right arm as well as sensory deficits. ? ?Reason for Service:  Patient was referred for  neuropsychological consultation due to coping and adjustment with significant residual motor deficits on the right side.  Below is the HPI for the current admission. ? ?HPI: Aaron Dennis is a 66 year old male who presented to the emergency department at [facility] on 04/27/2021 with complaints of right-sided weakness he stated had been ongoing since Thursday night.  Symptoms were acute in onset and he had no mental status changes.  He was in route from [place] to the [place] at the time of the symptoms.  Imaging work-up included head CT which was negative, MRI of the brain and lower extremity venous ultrasound.  The patient was seen in consultation by Dr. Leonel Ramsay.  MRI revealed an acute infarction in the left frontoparietal lobe with associated edema without mass effect.  She was well outside the window for thrombolytic therapy.  Venous ultrasound was negative for lower extremity DVT.  He was started on aspirin and Plavix therapy he will continue Plavix for 3 weeks then aspirin alone.  His Lipitor was increased from 10 mg to Crestor 20 mg.  Also noted on imaging was a 4 x 3 cerebral aneurysm and Dr. [name] discussed the findings with Dr. Estanislado Pandy.  Recommendations were to follow-up with interventional radiology as an outpatient.  CTA of head and neck showed multifocal intracranial stenosis including the above-mentioned aneurysm.  Echocardiography revealed ejection fraction of 60 to 65%.  The patient remained hemodynamically stable. Significant left-sided weakness.  No  dysarthria or dysphagia.  Heart healthy diet.  The patient requires inpatient medicine and rehabilitation evaluations and services for ongoing dysfunction secondary to left frontoparietal acute infarct. ? ?Current Status:  Patient was awake and alert sitting in his wheelchair in the upright position.  Patient displayed no motor movement at all in his right arm past his shoulder.  Patient was able to move his right leg and reports  that it has been making significant improvement with therapies but continues with weakness and motor control but continuing to improve.  Patient displayed good cognition throughout with good memory functions, good receptive and expressive language functions and denies any significant mood disturbance.  Patient was very pleasant and engaging with brother present in the room as well.  Reported good support system.  Patient aware of his status with no neglect symptoms noted.  Denied any significant visual-spatial deficits.  Reported good motivation for therapies with pertinent and appropriate questions regarding both rehab efforts as well as planning for post discharge rehab.  His English capacity was certainly adequate although there were 2 occasions where his brother assisted in translation of my questions but the patient was easily able to effectively communicate with me. ? ?Behavioral Observation: Aaron Dennis  presents as a 66 y.o.-year-old Right handed African Male who appeared his stated age. his dress was Appropriate and he was Well Groomed and his manners were Appropriate to the situation.  his participation was indicative of Appropriate and Attentive behaviors.  There were physical disabilities noted.  he displayed an appropriate level of cooperation and motivation.   ? ? ?Interactions:    Active Appropriate and Attentive ? ?Attention:   within normal limits and attention span and concentration were age appropriate ? ?Memory:   within normal limits; recent and remote memory intact ? ?Visuo-spatial:  not examined ? ?Speech (Volume):  normal ? ?Speech:   normal; normal ? ?Thought Process:  Coherent and Relevant ? ?Though Content:  WNL; not suicidal and not homicidal ? ?Orientation:   person, place, time/date, and situation ? ?Judgment:   Good ? ?Planning:   Good ? ?Affect:    Appropriate ? ?Mood:    Dysphoric ? ?Insight:   Good ? ?Intelligence:   normal ? ? ?Medical History:   ?Past Medical History:   ?Diagnosis Date  ? GERD (gastroesophageal reflux disease)   ? Hypertension   ? Lung nodule   ? 7 mm LUL nodule  ? ? ?     ?Patient Active Problem List  ? Diagnosis Date Noted  ? Cerebral edema ([place]) 04/29/2021  ? Stroke (cerebrum) ([place]) 04/29/2021  ? Right sided weakness   ? Acute CVA (cerebrovascular accident) ([place]) 04/27/2021  ? Primary osteoarthritis of left knee 01/13/2019  ? Primary osteoarthritis of right knee 01/13/2019  ? Flat foot 11/28/2017  ? Tendonitis of foot 11/28/2017  ? Polycythemia 05/12/2017  ? Vitamin D deficiency 04/21/2017  ? Bony sclerosis 08/05/2016  ? Lung nodule   ? Chronic pain of both knees 12/05/2015  ? Gastroesophageal reflux disease without esophagitis 05/17/2014  ? Essential hypertension 05/17/2014  ? Coronary artery calcification 08/17/2012  ? Abnormal LFTs (liver function tests) 07/11/2012  ? Chronic abdominal pain 06/24/2012  ? Right-sided chest wall pain 06/24/2012  ? Constipation, chronic 06/24/2012  ? Hyperbilirubinemia 06/24/2012  ? Chronic back pain 06/24/2012  ? DDD (degenerative disc disease), thoracolumbar 06/24/2012  ? History of nephrolithiasis 06/24/2012  ? Side pain 06/24/2012  ? Low back pain at multiple sites 06/24/2012  ? ?  ?  Psychiatric History:  No prior psychiatric history noted. ? ?Family Med/Psych History:  ?Family History  ?Problem Relation Age of Onset  ? Healthy Mother   ? Diabetes Brother   ? Colon polyps Neg Hx   ? Colon cancer Neg Hx   ? Esophageal cancer Neg Hx   ? Rectal cancer Neg Hx   ? Stomach cancer Neg Hx   ? ? ?Risk of Suicide/Violence: virtually non-existent patient without any suicidal or homicidal ideation. ? ?Impression/DX:  LAYTON NAVES is a 66 year old male who presented to the emergency department at [facility] on 04/27/2021 with reports of right-sided weakness that he reported had been going on the prior several days.  Patient reports that he was traveling from the [place] to the [place] and began having symptoms related to  paresthesias on his right arm prior to beginning his travels and saw Dr. There but they remitted and began having presentation of new symptoms intermittently during his long flight to [place].  They began to worsen as he

## 2021-05-09 NOTE — Progress Notes (Signed)
Patient encouraged to drink more po intake, urine appearance amber and cloudy, no odor noted, observed consuming only small sips at a time.or water. Monitor and provided fresh water at bed side ?Upon departmental rounding patient appears to be resting the majority of shift,respirations even and steady, eyes closed appears in no apparent distress or discomfort.  ? ? ?0530 ?No acute distress, denies pain or discomfort. Continue to provide po liquids and monitor I/O.Marland Kitchen Call bell within reach on right side and bed alarm  ?

## 2021-05-09 NOTE — Progress Notes (Signed)
?                                                       PROGRESS NOTE ? ? ?Subjective/Complaints: ? ?Slept 8pm to 5am- best he's slept.  ?R shoulder still heavy and occ painful, but doing better otherwise, daily.  ? ?K taping was redone.  ?ROS:  ? ?Pt denies SOB, abd pain, CP, N/V/C/D, and vision changes ? ?Objective: ?  ?No results found. ?No results for input(s): WBC, HGB, HCT, PLT in the last 72 hours. ? ? ?No results for input(s): NA, K, CL, CO2, GLUCOSE, BUN, CREATININE, CALCIUM in the last 72 hours. ? ? ?Intake/Output Summary (Last 24 hours) at 05/09/2021 1311 ?Last data filed at 05/09/2021 0701 ?Gross per 24 hour  ?Intake 100 ml  ?Output 400 ml  ?Net -300 ml  ?  ? ?  ? ?Physical Exam: ?Vital Signs ?Blood pressure 130/75, pulse 65, temperature 97.7 ?F (36.5 ?C), resp. rate 18, height '5\' 10"'$  (1.778 m), weight 76.4 kg, SpO2 100 %. ? ? ? ? ? ? ? ?General: awake, alert, appropriate, heading to gym with OT; sitting up in manual w/c; NAD ?HENT: conjugate gaze; oropharynx moist ?CV: regular rate; no JVD ?Pulmonary: CTA B/L; no W/R/R- good air movement ?GI: soft, NT, ND, (+)BS ?Psychiatric: appropriate ?Neurological: Ox3 ? ?Skin: ?   General: Skin is warm and dry.  ? ? MS: R shoulder -no sublux with k taping in place- however tape redone- no shoulder activation.   ?Neurological:  ?   Mental Status: He is alert and oriented to person, place, and time.  ?   Comments: Speech is at baseline per pt= per son Arabic, primary language is good ?Intact to light touch x 4 extremities. Motor 5/5 LUE and LLE. RUE 0/5. RLE 1+ to 2/5 prox to distal.  ? ? ?Assessment/Plan: ?1. Functional deficits which require 3+ hours per day of interdisciplinary therapy in a comprehensive inpatient rehab setting. ?Physiatrist is providing close team supervision and 24 hour management of active medical problems listed below. ?Physiatrist and rehab team continue to assess barriers to discharge/monitor patient progress toward functional and medical  goals ? ?Care Tool: ? ?Bathing ? Bathing activity did not occur: Safety/medical concerns ?Body parts bathed by patient: Right arm, Chest, Front perineal area, Abdomen, Right upper leg, Left upper leg, Face  ? Body parts bathed by helper: Left arm, Buttocks, Left lower leg, Right lower leg ?  ?  ?Bathing assist Assist Level: Moderate Assistance - Patient 50 - 74% ?  ?  ?Upper Body Dressing/Undressing ?Upper body dressing Upper body dressing/undressing activity did not occur (including orthotics): Safety/medical concerns ?What is the patient wearing?: Pull over shirt ?   ?Upper body assist Assist Level: Moderate Assistance - Patient 50 - 74% ?   ?Lower Body Dressing/Undressing ?Lower body dressing ? ? ? Lower body dressing activity did not occur: Safety/medical concerns ?What is the patient wearing?: Pants, Underwear/pull up ? ?  ? ?Lower body assist Assist for lower body dressing: Maximal Assistance - Patient 25 - 49% ?   ? ?Toileting ?Toileting Toileting Activity did not occur (Probation officer and hygiene only): N/A (no void or bm)  ?Toileting assist Assist for toileting: Total Assistance - Patient < 25% ?  ?  ?Transfers ?Chair/bed transfer ? ?Transfers assist ? Chair/bed  transfer activity did not occur: Safety/medical concerns ? ?Chair/bed transfer assist level: Moderate Assistance - Patient 50 - 74% ?  ?  ?Locomotion ?Ambulation ? ? ?Ambulation assist ? ? Ambulation activity did not occur: Safety/medical concerns ? ?Assist level: Moderate Assistance - Patient 50 - 74% ?Assistive device: Other (comment) (hand rail) ?Max distance: 30  ? ?Walk 10 feet activity ? ? ?Assist ? Walk 10 feet activity did not occur: Safety/medical concerns ? ?Assist level: Moderate Assistance - Patient - 50 - 74% ?Assistive device: Other (comment) (hand rail)  ? ?Walk 50 feet activity ? ? ?Assist Walk 50 feet with 2 turns activity did not occur: Safety/medical concerns ? ?  ?   ? ? ?Walk 150 feet activity ? ? ?Assist Walk 150 feet  activity did not occur: Safety/medical concerns ? ?  ?  ?  ? ?Walk 10 feet on uneven surface  ?activity ? ? ?Assist Walk 10 feet on uneven surfaces activity did not occur: Safety/medical concerns ? ? ?  ?   ? ?Wheelchair ? ? ? ? ?Assist Is the patient using a wheelchair?: Yes ?Type of Wheelchair: Manual ?  ? ?Wheelchair assist level: Minimal Assistance - Patient > 75% ?Max wheelchair distance: 20  ? ? ?Wheelchair 50 feet with 2 turns activity ? ? ? ?Assist ? ?  ?  ? ? ?   ? ?Wheelchair 150 feet activity  ? ? ? ?Assist ?   ? ? ?   ? ?Blood pressure 130/75, pulse 65, temperature 97.7 ?F (36.5 ?C), resp. rate 18, height '5\' 10"'$  (1.778 m), weight 76.4 kg, SpO2 100 %. ? ?Medical Problem List and Plan: ?1. Functional deficits secondary to acute left frontoparietal infarct ?            -patient may  shower ?            -ELOS/Goals: 7-10 days supervision ? D/c date 4/11 ? Continue CIR- PT, OT - estim for R shoulder pain and activation ?2.  Antithrombotics: ?-DVT/anticoagulation:  Pharmaceutical: Lovenox ?            -antiplatelet therapy: Plavix and aspirin for 3 weeks then aspirin alone ?3. Pain Management: Tylenol ? 3/29- schedule tylenol at 8pm for R shoulder pain- wait on steroid injection of R shoulder for now; also wait on prevention meds for HA's- suggest pt ask for tylenol prn for now ? 3/30- pain in R shoulder better last night- con't regimen ? 3/31- will see about estim for R shoulder ?4. Mood: LCSW to evaluate and provide emotional support ? 3/23- pt c/o feeling depressed over stroke- will start Celexa 20 mg daily. ?3/27- improved mood with celexa- con't regimen   ?            -antipsychotic agents: Not applicable ?5. Neuropsych: This patient is capable of making decisions on his own behalf. ?6. Skin/Wound Care: Routine skin care checks ?    ?7. Fluids/Electrolytes/Nutrition: Routine I's and O's and follow-up chemistries ?8: Cerebral aneurysm: follow-up IR as outpatient ?9: Hyperlipidemia:: continue Crestor 20 mg  daily ?10: Essential hypertension: Continue amlodipine 5 mg daily ?-- Proscar 5 mg daily ?3/30- BP great control- con't regimen ?11: Polycythemia: Follow-up medical oncology as outpatient ?12: GI prophylaxis: Continue Protonix ?13: BPH: Continue Flomax ? 3/25- peeing well- con't flomax ?14. Constipation- LBM 3 days ago- will give miralax prn ?3/22- will give miralax x1 this AM- if doesn't work, will give sorbitol tomorrow  ?3/30- going daily per pt- con't regimen ?  15/ hypokalemia ? 3/22- will replete 40 mEq x1 and recheck  ?3/27- K+ 3.7- con't regimen off K+. ? 3/31- labs MOnday ?16. Insomnia ?3/28- Will try Low dose Trazodone and monitor for urinary retention- 50 mg QHS-   ? 3/29- will change trazodone to 100 mg QHS and give at [place] well.  ?3/31- slept 8+ hours last night ?17. Dispo3/22- 1 family member can stay overnight. ?3/29- d/c date set for 4/11-  ? ? ?  ? ? ? ? ? ?LOS: ?10 days ?A FACE TO FACE EVALUATION WAS PERFORMED ? ?Zniya Cottone ?05/09/2021, 1:11 PM  ? ? ? ?

## 2021-05-10 DIAGNOSIS — Z8673 Personal history of transient ischemic attack (TIA), and cerebral infarction without residual deficits: Secondary | ICD-10-CM

## 2021-05-10 MED ORDER — SENNOSIDES-DOCUSATE SODIUM 8.6-50 MG PO TABS
2.0000 | ORAL_TABLET | Freq: Two times a day (BID) | ORAL | Status: DC
  Administered 2021-05-10 – 2021-05-19 (×12): 2 via ORAL
  Filled 2021-05-10 (×19): qty 2

## 2021-05-10 NOTE — Progress Notes (Signed)
Slept good. Using urinal. LBM 0327, PRN miralax given on previous shift. Denies pain. Aaron Dennis A  ?

## 2021-05-10 NOTE — Progress Notes (Signed)
?                                                       PROGRESS NOTE ? ? ?Subjective/Complaints: ? ?Slept 8pm to 5am- best he's slept.  ?R shoulder still heavy and occ painful, but doing better otherwise, daily.  ? ?K taping was redone.  ?ROS:  ? ?Pt denies SOB, abd pain, CP, N/V/C/D, and vision changes ? ?Objective: ?  ?No results found. ?No results for input(s): WBC, HGB, HCT, PLT in the last 72 hours. ? ? ?No results for input(s): NA, K, CL, CO2, GLUCOSE, BUN, CREATININE, CALCIUM in the last 72 hours. ? ? ?Intake/Output Summary (Last 24 hours) at 05/10/2021 1233 ?Last data filed at 05/10/2021 0900 ?Gross per 24 hour  ?Intake 477 ml  ?Output 750 ml  ?Net -273 ml  ? ?  ? ?  ? ?Physical Exam: ?Vital Signs ?Blood pressure 114/64, pulse 62, temperature 97.7 ?F (36.5 ?C), temperature source Oral, resp. rate 18, height '5\' 10"'$  (1.778 m), weight 76.4 kg, SpO2 99 %. ? ? ? ? ? ? ? ? ?Skin: ?   General: Skin is warm and dry.  ? ? MS: R shoulder -no sublux with k taping in place- however tape redone- no shoulder activation.   ?Neurological:  ?   Mental Status: He is alert and oriented to person, place, and time.  ?   Comments: Speech is at baseline per pt= per son Arabic, primary language is good ?Intact to light touch x 4 extremities. Motor 5/5 LUE and LLE. RUE 1/5 finger flexors and triceps . RLE 2+/5 prox to distal.  ? ? ?Assessment/Plan: ?1. Functional deficits which require 3+ hours per day of interdisciplinary therapy in a comprehensive inpatient rehab setting. ?Physiatrist is providing close team supervision and 24 hour management of active medical problems listed below. ?Physiatrist and rehab team continue to assess barriers to discharge/monitor patient progress toward functional and medical goals ? ?Care Tool: ? ?Bathing ? Bathing activity did not occur: Safety/medical concerns ?Body parts bathed by patient: Right arm, Chest, Front perineal area, Abdomen, Right upper leg, Left upper leg, Face  ? Body parts bathed by  helper: Left arm, Buttocks, Left lower leg, Right lower leg ?  ?  ?Bathing assist Assist Level: Moderate Assistance - Patient 50 - 74% ?  ?  ?Upper Body Dressing/Undressing ?Upper body dressing Upper body dressing/undressing activity did not occur (including orthotics): Safety/medical concerns ?What is the patient wearing?: Pull over shirt ?   ?Upper body assist Assist Level: Moderate Assistance - Patient 50 - 74% ?   ?Lower Body Dressing/Undressing ?Lower body dressing ? ? ? Lower body dressing activity did not occur: Safety/medical concerns ?What is the patient wearing?: Pants, Underwear/pull up ? ?  ? ?Lower body assist Assist for lower body dressing: Maximal Assistance - Patient 25 - 49% ?   ? ?Toileting ?Toileting Toileting Activity did not occur (Probation officer and hygiene only): N/A (no void or bm)  ?Toileting assist Assist for toileting: Maximal Assistance - Patient 25 - 49% ?  ?  ?Transfers ?Chair/bed transfer ? ?Transfers assist ? Chair/bed transfer activity did not occur: Safety/medical concerns ? ?Chair/bed transfer assist level: Minimal Assistance - Patient > 75% ?  ?  ?Locomotion ?Ambulation ? ? ?Ambulation assist ? ? Ambulation activity did not  occur: Safety/medical concerns ? ?Assist level: Minimal Assistance - Patient > 75% ?Assistive device: Walker-hemi ?Max distance: 60  ? ?Walk 10 feet activity ? ? ?Assist ? Walk 10 feet activity did not occur: Safety/medical concerns ? ?Assist level: Minimal Assistance - Patient > 75% ?Assistive device: Walker-hemi  ? ?Walk 50 feet activity ? ? ?Assist Walk 50 feet with 2 turns activity did not occur: Safety/medical concerns ? ?Assist level: Minimal Assistance - Patient > 75% ?Assistive device: Walker-hemi  ? ? ?Walk 150 feet activity ? ? ?Assist Walk 150 feet activity did not occur: Safety/medical concerns ? ?  ?  ?  ? ?Walk 10 feet on uneven surface  ?activity ? ? ?Assist Walk 10 feet on uneven surfaces activity did not occur: Safety/medical  concerns ? ? ?  ?   ? ?Wheelchair ? ? ? ? ?Assist Is the patient using a wheelchair?: Yes ?Type of Wheelchair: Manual ?  ? ?Wheelchair assist level: Minimal Assistance - Patient > 75% ?Max wheelchair distance: 20  ? ? ?Wheelchair 50 feet with 2 turns activity ? ? ? ?Assist ? ?  ?  ? ? ?   ? ?Wheelchair 150 feet activity  ? ? ? ?Assist ?   ? ? ?   ? ?Blood pressure 114/64, pulse 62, temperature 97.7 ?F (36.5 ?C), temperature source Oral, resp. rate 18, height '5\' 10"'$  (1.778 m), weight 76.4 kg, SpO2 99 %. ? ?Medical Problem List and Plan: ?1. Functional deficits secondary to acute left frontoparietal infarct ?            -patient may  shower ?            -ELOS/Goals: 7-10 days supervision ? D/c date 4/11 ? Continue CIR- PT, OT - estim for R shoulder pain and activation ?2.  Antithrombotics: ?-DVT/anticoagulation:  Pharmaceutical: Lovenox ?            -antiplatelet therapy: Plavix and aspirin for 3 weeks then aspirin alone ?3. Pain Management: Tylenol ? 3/29- schedule tylenol at 8pm for R shoulder pain- wait on steroid injection of R shoulder for now; also wait on prevention meds for HA's- suggest pt ask for tylenol prn for now ? 3/30- pain in R shoulder better last night- con't regimen ? 3/31- will see about estim for R shoulder- write order for OT ?4. Mood: LCSW to evaluate and provide emotional support ? 3/23- pt c/o feeling depressed over stroke- will start Celexa 20 mg daily. ?3/27- improved mood with celexa- con't regimen   ?            -antipsychotic agents: Not applicable ?5. Neuropsych: This patient is capable of making decisions on his own behalf. ?6. Skin/Wound Care: Routine skin care checks ?    ?7. Fluids/Electrolytes/Nutrition: Routine I's and O's and follow-up chemistries ?8: Cerebral aneurysm: follow-up IR as outpatient ?9: Hyperlipidemia:: continue Crestor 20 mg daily ?10: Essential hypertension: Continue amlodipine 5 mg daily ?Vitals:  ? 05/10/21 0344 05/10/21 0825  ?BP: 110/62 114/64  ?Pulse: 62    ?Resp: 18   ?Temp: 97.7 ?F (36.5 ?C)   ?SpO2: 99%   ? ? ?11: Polycythemia: Follow-up medical oncology as outpatient ?12: GI prophylaxis: Continue Protonix ?13: BPH: Continue Flomax ? 3/25- peeing well- con't flomax ?14. Constipation- LBM 3 days ago- will give miralax prn ?Pt requesting stool softener ? 15/ hypokalemia ? 3/22- will replete 40 mEq x1 and recheck  ?3/27- K+ 3.7- con't regimen off K+. ? 3/31- labs MOnday ?16. Insomnia ?3/28-  Will try Low dose Trazodone and monitor for urinary retention- 50 mg QHS-   ? 3/29- will change trazodone to 100 mg QHS and give at [place] well.  ?3/31- slept 8+ hours last night ?17. Dispo3/22- 1 family member can stay overnight. ?3/29- d/c date set for 4/11-  ? ? ?  ? ? ? ? ? ?LOS: ?11 days ?A FACE TO FACE EVALUATION WAS PERFORMED ? ?Luanna Salk Aja Bolander ?05/10/2021, 12:33 PM  ? ? ? ?

## 2021-05-12 LAB — BASIC METABOLIC PANEL
Anion gap: 7 (ref 5–15)
BUN: 18 mg/dL (ref 8–23)
CO2: 28 mmol/L (ref 22–32)
Calcium: 9.1 mg/dL (ref 8.9–10.3)
Chloride: 104 mmol/L (ref 98–111)
Creatinine, Ser: 0.95 mg/dL (ref 0.61–1.24)
GFR, Estimated: >60 mL/min (ref >60)
Glucose, Bld: 101 mg/dL — ABNORMAL HIGH (ref 70–99)
Potassium: 3.4 mmol/L — ABNORMAL LOW (ref 3.5–5.1)
Sodium: 139 mmol/L (ref 135–145)

## 2021-05-12 LAB — CBC
HCT: 49.4 % (ref 39.0–52.0)
Hemoglobin: 16.4 g/dL (ref 13.0–17.0)
MCH: 27.5 pg (ref 26.0–34.0)
MCHC: 33.2 g/dL (ref 30.0–36.0)
MCV: 82.7 fL (ref 80.0–100.0)
Platelets: 257 10*3/uL (ref 150–400)
RBC: 5.97 MIL/uL — ABNORMAL HIGH (ref 4.22–5.81)
RDW: 13.5 % (ref 11.5–15.5)
WBC: 7.1 10*3/uL (ref 4.0–10.5)
nRBC: 0 % (ref 0.0–0.2)

## 2021-05-12 MED ORDER — POTASSIUM CHLORIDE CRYS ER 20 MEQ PO TBCR
40.0000 meq | EXTENDED_RELEASE_TABLET | Freq: Once | ORAL | Status: AC
  Administered 2021-05-12: 40 meq via ORAL
  Filled 2021-05-12: qty 2

## 2021-05-12 MED ORDER — SORBITOL 70 % SOLN
30.0000 mL | Freq: Once | Status: AC
  Administered 2021-05-12: 30 mL via ORAL
  Filled 2021-05-12: qty 30

## 2021-05-12 NOTE — Progress Notes (Signed)
Physical Therapy Session Note ? ?Patient Details  ?Name: Aaron Dennis ?MRN: [id number] ?Date of Birth: February 19, 1955 ? ?Today's Date: 05/12/2021 ?PT Individual Time: 4008-6761 ?PT Individual Time Calculation (min): 30 min  ? ?Short Term Goals: ?Week 2:  PT Short Term Goal 1 (Week 2): Pt will ambulate 60f with min assist consistently ?PT Short Term Goal 2 (Week 2): Pt will propell WC >1517fwith supevision assist ?PT Short Term Goal 3 (Week 2): Pt will trasnfer to and from bed with min assist consistently ?PT Short Term Goal 4 (Week 2): Pt wil ascend 3 steps with mod assist and LRAD ? ?Skilled Therapeutic Interventions/Progress Updates:   Pt presents sitting in w/c and agreeable to therapy. Pt required Total A for donning shoes, although pushes w/ B feet into shoes.  Pt wheeled to Dayroom for time conservation.  Pt performed sit to stand transfers w/ min A.  Pt amb w/ min A and HW x 35' including turns to return to seat.  Pt amb w/o ace wrap for DF assist and maintains RLE placement w/ gait.  Pt required min cueing for safe approach to seat 2/2 size of HW, but performs w/o LOB.  Pt w/ improved RLE strength w/o buckling w/ weight acceptance.  Pt performed multiple sit to stand w/o UE support, practicing forward lean over PT shoulder and mod A and min blocking of R knee.  Pt required verba cues for breathing technique and then requires seated rest break between trial.  Pt returned to room in w/c and handed off to NT for application of chair alarm. ?   ? ?Therapy Documentation ?Precautions:  ?Precautions ?Precautions: Fall ?Restrictions ?Weight Bearing Restrictions: No ?General: ?  ?Vital Signs: ?  ?Pain:0/10 ?Pain Assessment ?Pain Scale: 0-10 ?Pain Score: 0-No pain ?Mobility: ?  ? ? ? ?Therapy/Group: Individual Therapy ? ?[name]4/04/2021, 8:31 AM  ?

## 2021-05-12 NOTE — Progress Notes (Signed)
?                                                       PROGRESS NOTE ? ? ?Subjective/Complaints: ? ?Pt reports no therapy over weekend.  ?LBM 3 days ago. R shoulder still feels heavy- but OK.  ? ?K+ 3.4 ?ROS:  ? ?Pt denies SOB, abd pain, CP, N/V/C/D, and vision changes ? ? ?Objective: ?  ?No results found. ?Recent Labs  ?  05/12/21 ?0737  ?WBC 7.1  ?HGB 16.4  ?HCT 49.4  ?PLT 257  ? ? ? ?Recent Labs  ?  05/12/21 ?0737  ?NA 139  ?K 3.4*  ?CL 104  ?CO2 28  ?GLUCOSE 101*  ?BUN 18  ?CREATININE 0.95  ?CALCIUM 9.1  ? ? ? ?Intake/Output Summary (Last 24 hours) at 05/12/2021 1920 ?Last data filed at 05/12/2021 1816 ?Gross per 24 hour  ?Intake 656 ml  ?Output 800 ml  ?Net -144 ml  ?  ? ?  ? ?Physical Exam: ?Vital Signs ?Blood pressure 122/82, pulse 75, temperature 97.8 ?F (36.6 ?C), resp. rate 16, height '5\' 10"'$  (1.778 m), weight 76.3 kg, SpO2 100 %. ? ? ? ? ? ? ? ? ?General: awake, alert, appropriate, friends in room; NAD ?HENT: conjugate gaze; oropharynx moist ?CV: regular rate; no JVD ?Pulmonary: CTA B/L; no W/R/R- good air movement ?GI: soft, NT, ND, (+)BS ?Psychiatric: appropriate ?Neurological: Ox3 ? ?Skin: ?   General: Skin is warm and dry.  ? ? MS: R shoulder -no sublux with k taping in place- however has 2 finger subluxation noted this AM- no activation ?Neurological:  ?   Mental Status: He is alert and oriented to person, place, and time.  ?   Comments: Speech is at baseline per pt= per son Arabic, primary language is good ?Intact to light touch x 4 extremities. Motor 5/5 LUE and LLE. RUE 1/5 finger flexors and triceps . RLE 2+/5 prox to distal.  ? ? ?Assessment/Plan: ?1. Functional deficits which require 3+ hours per day of interdisciplinary therapy in a comprehensive inpatient rehab setting. ?Physiatrist is providing close team supervision and 24 hour management of active medical problems listed below. ?Physiatrist and rehab team continue to assess barriers to discharge/monitor patient progress toward functional and  medical goals ? ?Care Tool: ? ?Bathing ? Bathing activity did not occur: Safety/medical concerns ?Body parts bathed by patient: Right arm, Chest, Front perineal area, Abdomen, Right upper leg, Left upper leg, Face  ? Body parts bathed by helper: Left arm, Buttocks, Left lower leg, Right lower leg ?  ?  ?Bathing assist Assist Level: Moderate Assistance - Patient 50 - 74% ?  ?  ?Upper Body Dressing/Undressing ?Upper body dressing Upper body dressing/undressing activity did not occur (including orthotics): Safety/medical concerns ?What is the patient wearing?: Pull over shirt ?   ?Upper body assist Assist Level: Moderate Assistance - Patient 50 - 74% ?   ?Lower Body Dressing/Undressing ?Lower body dressing ? ? ? Lower body dressing activity did not occur: Safety/medical concerns ?What is the patient wearing?: Pants, Underwear/pull up ? ?  ? ?Lower body assist Assist for lower body dressing: Maximal Assistance - Patient 25 - 49% ?   ? ?Toileting ?Toileting Toileting Activity did not occur (Probation officer and hygiene only): N/A (no void or bm)  ?Toileting assist Assist for  toileting: Maximal Assistance - Patient 25 - 49% ?  ?  ?Transfers ?Chair/bed transfer ? ?Transfers assist ? Chair/bed transfer activity did not occur: Safety/medical concerns ? ?Chair/bed transfer assist level: Minimal Assistance - Patient > 75% ?  ?  ?Locomotion ?Ambulation ? ? ?Ambulation assist ? ? Ambulation activity did not occur: Safety/medical concerns ? ?Assist level: Minimal Assistance - Patient > 75% ?Assistive device: Walker-hemi ?Max distance: 35  ? ?Walk 10 feet activity ? ? ?Assist ? Walk 10 feet activity did not occur: Safety/medical concerns ? ?Assist level: Minimal Assistance - Patient > 75% ?Assistive device: Walker-hemi  ? ?Walk 50 feet activity ? ? ?Assist Walk 50 feet with 2 turns activity did not occur: Safety/medical concerns ? ?Assist level: Minimal Assistance - Patient > 75% ?Assistive device: Walker-hemi  ? ? ?Walk 150  feet activity ? ? ?Assist Walk 150 feet activity did not occur: Safety/medical concerns ? ?  ?  ?  ? ?Walk 10 feet on uneven surface  ?activity ? ? ?Assist Walk 10 feet on uneven surfaces activity did not occur: Safety/medical concerns ? ? ?  ?   ? ?Wheelchair ? ? ? ? ?Assist Is the patient using a wheelchair?: Yes ?Type of Wheelchair: Manual ?  ? ?Wheelchair assist level: Minimal Assistance - Patient > 75% ?Max wheelchair distance: 20  ? ? ?Wheelchair 50 feet with 2 turns activity ? ? ? ?Assist ? ?  ?  ? ? ?   ? ?Wheelchair 150 feet activity  ? ? ? ?Assist ?   ? ? ?   ? ?Blood pressure 122/82, pulse 75, temperature 97.8 ?F (36.6 ?C), resp. rate 16, height '5\' 10"'$  (1.778 m), weight 76.3 kg, SpO2 100 %. ? ?Medical Problem List and Plan: ?1. Functional deficits secondary to acute left frontoparietal infarct ?            -patient may  shower ?            -ELOS/Goals: 7-10 days supervision ? D/c date 4/11 ? Continue CIR- PT, OT - estim for R shoulder ?2.  Antithrombotics: ?-DVT/anticoagulation:  Pharmaceutical: Lovenox ?            -antiplatelet therapy: Plavix and aspirin for 3 weeks then aspirin alone ?3. Pain Management: Tylenol ? 3/29- schedule tylenol at 8pm for R shoulder pain- wait on steroid injection of R shoulder for now; also wait on prevention meds for HA's- suggest pt ask for tylenol prn for now ? 3/30- pain in R shoulder better last night- con't regimen ? 3/31- will see about estim for R shoulder- write order for OT ?4. Mood: LCSW to evaluate and provide emotional support ? 3/23- pt c/o feeling depressed over stroke- will start Celexa 20 mg daily. ?3/27- improved mood with celexa- con't regimen   ?            -antipsychotic agents: Not applicable ?5. Neuropsych: This patient is capable of making decisions on his own behalf. ?6. Skin/Wound Care: Routine skin care checks ?    ?7. Fluids/Electrolytes/Nutrition: Routine I's and O's and follow-up chemistries ?8: Cerebral aneurysm: follow-up IR as outpatient ?9:  Hyperlipidemia:: continue Crestor 20 mg daily ?10: Essential hypertension: Continue amlodipine 5 mg daily ?Vitals:  ? 05/12/21 0331 05/12/21 1533  ?BP: 109/77 122/82  ?Pulse: 62 75  ?Resp: 18 16  ?Temp: 97.8 ?F (36.6 ?C) 97.8 ?F (36.6 ?C)  ?SpO2: 100% 100%  ?4/3- BP controlled- con't regimen ? ?11: Polycythemia: Follow-up medical oncology as outpatient ?12:  GI prophylaxis: Continue Protonix ?13: BPH: Continue Flomax ? 3/25- peeing well- con't flomax ?14. Constipation- LBM 3 days ago- will give miralax prn ?Pt requesting stool softener ?4/3- will give sorbitol after therapy today 30cc- has been 3 days ? 15/ hypokalemia ? 3/22- will replete 40 mEq x1 and recheck  ?3/27- K+ 3.7- con't regimen off K+. ? 3/31- labs Monday ?4/3- K+ 3.4- will give 40 Meq x1- and will likely need 10 mEq daily? Will d/w team tomorrow ?16. Insomnia ?3/28- Will try Low dose Trazodone and monitor for urinary retention- 50 mg QHS-   ? 3/29- will change trazodone to 100 mg QHS and give at [place] well.  ?3/31- slept 8+ hours last night ?17. Dispo3/22- 1 family member can stay overnight. ?3/29- d/c date set for 4/11-  ? ?I spent a total of 35   minutes on total care today- >50% coordination of care- due to d/w team about KCL and subluxation- educated pt as well.  ? ? ?  ? ? ? ? ? ?LOS: ?13 days ?A FACE TO FACE EVALUATION WAS PERFORMED ? ?Nikolette Reindl ?05/12/2021, 7:20 PM  ? ? ? ?

## 2021-05-12 NOTE — Progress Notes (Signed)
Occupational Therapy Session Note ? ?Patient Details  ?Name: Aaron Dennis ?MRN: [id number] ?Date of Birth: 08-10-1955 ? ?Today's Date: 05/12/2021 ? ?OT Individual Time: 6270-3500 ?OT Individual Time Calculation (min): 44 min   ? ?OT Individual Time: 9381-8299 ?OT Individual Time Calculation (min): 53 min  ? ? ? ?Short Term Goals: ?Week 1:  OT Short Term Goal 1 (Week 1): Patient will don UB clothing with good recall of hemi technique and Min A with no more than 2 verbal cues. ?OT Short Term Goal 1 - Progress (Week 1): Progressing toward goal ?OT Short Term Goal 2 (Week 1): Patient complete 1/3 parts of toileting task with Mod A and LRAD. ?OT Short Term Goal 2 - Progress (Week 1): Met ?OT Short Term Goal 3 (Week 1): Patient will don LB clothing with hemi technique and Mod A with LRAD. ?OT Short Term Goal 3 - Progress (Week 1): Met ?OT Short Term Goal 4 (Week 1): Patient will complete squat-pivot to [facility] with Min A. ?OT Short Term Goal 4 - Progress (Week 1): Progressing toward goal ?Week 2:  OT Short Term Goal 1 (Week 2): Pt will don LB clothing with min A overall ?OT Short Term Goal 2 (Week 2): Pt will complete transfer to [facility] with min A ?OT Short Term Goal 3 (Week 2): Pt will complete UB dressing with min A, no more than 2 cues for hemi technique ? ?Skilled Therapeutic Interventions/Progress Updates:  ?Session 1: Patient met seated in wc with hips approaching the edge of wc seated. Cues for posterior scoot to prevent fall in the floor. Patient in agreement with OT treatment session. 0/10 pain reported at rest and with activity. Reports pain in R shoulder yesterday. Did not have therapies over the weekend. OT treatment session with focus on self-care re-education and RUE NMR. UB bathing/dressing seated at sink level with CGA to maintain unsupported sitting balance in dumped wc and Min A for LB bathing/dressing in sitting standing. Min vc's for hemi technique and positioning of RUE. Functional mobility ~35-98f with hemi  walker and givmohr sling. Cues for proximity and advancement of RLE. Total A for wc transport the reset of the way to ortho gym for time management. Patient completed 5 min on BUE arm ergometer (on level 1) with ace wrap to maintain position of RUE. External assist to facilitate scapular elevation/depression and to support weight of RUE. Total A for wc transport back to hospital room. Patient able to return to supine with CGA and cues for positioning. Session concluded with patient lying supine in bed with call bell within reach, bed alarm activated and all needs met.  ? ?Session 2: Afternoon session with focus on functional mobility in prep for ADL transfers, therapeutic activity and RUE NMR. Sit stand from wc with Min A and mobility to rehab gym with Min A 2/2 (mild motor planning deficits?). Cues for safe stand to sit on mat table. Scapular mobility in sitting. Patient with some return in RBrowningtondemonstrating ability to shrug shoulders and push/pull into therapists hand in sitting. Patient also able to make a loose fist. NMES applied to wrist extensors. Patient then able to grasp/release washcloth in sync with NMES on extensors followed by grasp/release of cones and pink foam blocks with external assist for shoulder flexion/internal rotation to place into cup. Total A for wc transport back to room for time management. Session concluded with patient seated in wc with call bell within reach bed alarm activated and all needs met. Wife  present at bedside.  ? ?Therapy Documentation ?Precautions:  ?Precautions ?Precautions: Fall ?Restrictions ?Weight Bearing Restrictions: No ?General: ?General ?OT Amount of Missed Time: 8 Minutes ? ?Therapy/Group: Individual Therapy ? ?Aengus Sauceda R Howerton-Davis ?05/12/2021, 2:38 PM ?

## 2021-05-13 MED ORDER — POTASSIUM CHLORIDE CRYS ER 10 MEQ PO TBCR
10.0000 meq | EXTENDED_RELEASE_TABLET | Freq: Every day | ORAL | Status: DC
  Administered 2021-05-13 – 2021-05-20 (×8): 10 meq via ORAL
  Filled 2021-05-13 (×8): qty 1

## 2021-05-13 NOTE — Progress Notes (Signed)
Physical Therapy Session Note ? ?Patient Details  ?Name: Aaron Dennis ?MRN: [id number] ?Date of Birth: [birth year] ? ?Today's Date: 05/13/2021 ?PT Individual Time: 1030-1100 ?PT Individual Time Calculation (min): 30 min  ? ?Short Term Goals: ?Week 2:  PT Short Term Goal 1 (Week 2): Pt will ambulate 80f with min assist consistently ?PT Short Term Goal 2 (Week 2): Pt will propell WC >15103fwith supevision assist ?PT Short Term Goal 3 (Week 2): Pt will trasnfer to and from bed with min assist consistently ?PT Short Term Goal 4 (Week 2): Pt wil ascend 3 steps with mod assist and LRAD ? ?Skilled Therapeutic Interventions/Progress Updates:  ?  Pt received seated in w/c in room, agreeable to PT session. No complaints of pain. Dependent transport via w/c to/from therapy gym for time and energy conservation. Sit to stand with [facility] to mat table with HW and min A. Session focus on static standing balance and RLE NMR and coordination training. Standing alt L/R 4" step-taps with HW and min A for balance. Progression to alt L/R cone taps with HW and min to mod A for balance. Pt struggles to maintain balance with stance on RLE and requires increased physical assist to prevent fall as well as cues for safety. Pt tends to exhibit narrow BOS and decreased awareness of and ability to correct LOB. Transitioned from use of HW with LUE to therapist HHA due to pt with L lateral rotation and bend when reaching for [facility]Str[facility] Lelandor support. Pt returned to room and left seated in w/c with needs in reach, family present at end of session. ? ?Therapy Documentation ?Precautions:  ?Precautions ?Precautions: Fall ?Restrictions ?Weight Bearing Restrictions: No ? ? ? ? ? ?Therapy/Group: Individual Therapy ? ? ?TaExcell SeltzerPT, DPT, CSRS ?05/13/2021, 1:35 PM  ?

## 2021-05-13 NOTE — Progress Notes (Signed)
Physical Therapy Session Note ? ?Patient Details  ?Name: Aaron Dennis ?MRN: [id number] ?Date of Birth: 1955-02-20 ? ?Today's Date: 05/13/2021 ?PT Individual Time: 7371-0626 ?PT Individual Time Calculation (min): 72 min  ? ?Short Term Goals: ?Week 1:  PT Short Term Goal 1 (Week 1): Pt will initate gait training ?PT Short Term Goal 1 - Progress (Week 1): Met ?PT Short Term Goal 2 (Week 1): pt will propel w/c with min A or better ?PT Short Term Goal 2 - Progress (Week 1): Met ?PT Short Term Goal 3 (Week 1): Pt will perform squat pivot with min A or better ?PT Short Term Goal 3 - Progress (Week 1): Progressing toward goal ?Week 2:  PT Short Term Goal 1 (Week 2): Pt will ambulate 72f with min assist consistently ?PT Short Term Goal 2 (Week 2): Pt will propell WC >1564fwith supevision assist ?PT Short Term Goal 3 (Week 2): Pt will trasnfer to and from bed with min assist consistently ?PT Short Term Goal 4 (Week 2): Pt wil ascend 3 steps with mod assist and LRAD ? ?Skilled Therapeutic Interventions/Progress Updates:  ?  Pt seated in w/c on arrival and agreeable to therapy. Pt reports soreness/pain in his neck which worsened with mobility. Addressed with STM. ? ?Pt transported to therapy gym for time management and energy conservation. Pt ambulated 3 x 150 ft with min A and hemi walker. Pt demoes L lateral trunk lean which can moderately correct with cueing. Flexed knee posture in stance but no noted knee buckling. Step to pattern progressing to step through pattern with verbal cues.  ? ?Pt continued to c/o pain in the lateral neck, so transitioned to [facility]<>sit with supervision. Provided triggerpoint release at scalenes, sub occipitals, SCM. Pt reported improvement in pain after STM. Pt then returned to room and remained in w/c, was left with all needs in reach and alarm active.  ? ?Therapy Documentation ?Precautions:  ?Precautions ?Precautions: Fall ?Restrictions ?Weight Bearing Restrictions: No ?General: ?   ? ? ? ? ?Therapy/Group: Individual Therapy ? ?[place]4/05/2021, 12:40 PM  ?

## 2021-05-13 NOTE — Progress Notes (Signed)
Occupational Therapy Session Note ? ?Patient Details  ?Name: Aaron Dennis ?MRN: [id number] ?Date of Birth: 04/28/1955 ? ?Today's Date: 05/13/2021 ? ?OT Individual Time: 0701-0800 ?OT Individual Time Calculation (min): 59 min  ? ?OT Individual Time: 1103-1200  ?OT Individual Time Calculation (min): 57 min  ? ? ?Short Term Goals: ?Week 1:  OT Short Term Goal 1 (Week 1): Patient will don UB clothing with good recall of hemi technique and Min A with no more than 2 verbal cues. ?OT Short Term Goal 1 - Progress (Week 1): Progressing toward goal ?OT Short Term Goal 2 (Week 1): Patient complete 1/3 parts of toileting task with Mod A and LRAD. ?OT Short Term Goal 2 - Progress (Week 1): Met ?OT Short Term Goal 3 (Week 1): Patient will don LB clothing with hemi technique and Mod A with LRAD. ?OT Short Term Goal 3 - Progress (Week 1): Met ?OT Short Term Goal 4 (Week 1): Patient will complete squat-pivot to [address] [employer] with Min A. ?OT Short Term Goal 4 - Progress (Week 1): Progressing toward goal ?Week 2:  OT Short Term Goal 1 (Week 2): Pt will don LB clothing with min A overall ?OT Short Term Goal 2 (Week 2): Pt will complete transfer to [facility] with min A ?OT Short Term Goal 3 (Week 2): Pt will complete UB dressing with min A, no more than 2 cues for hemi technique ? ?Skilled Therapeutic Interventions/Progress Updates:  ?Session 1: Patient met seated in wc in agreement with OT treatment session. Rodena Goldmann 6/10 pain reported in R cervical neck radiating into R aspect of head. Soft tissue massage in supine for pain management and education on position while lying, sitting and in standing. OT treatment session with focus on self-care re-education, NMR and therapeutic activity. Patient completed several sit to stand transfer with Min A and use of hemi walker with cues for hand placement and orientation to midline. Patient completed walk-in shower transfer with Min A and UB bathing/dressing with Min A overall. Mod A to don footwear with patient  able to don L sock/shoe. Assist to don R sock/shoe. Total A for wc transfer to therapy gym. Re-applied k-tape to R shoulder for pain management and to decrease pain associated with subluxation. Reports decreased pain with therapy efforts this date. Total A for wc transport back to room for time management. Session concluded with patient seated in wc with call bell within reach, belt alarm activated and all needs met.  ? ?Session 2: Patient met seated in wc in agreement with OT treatment session. 7/10 pain reported at rest and with activity. Treatment session with focus on RUE NMR as detailed below. Total A for wc transport to and from rehab gym for time management. Attempted to get patient in semi quadruped but unable 2/2 pain indicated above. Steps to EOB with CGA-Min A and return to supine with CGA. Session concluded with patient lying supine bed with call bell within reach, bed alarm activated and all needs met.  ? ?1:1 NMES applied to supraspinatus and middle deltoid to help approximate shoulder joint to reduce sublux and reduce pain. Reports decreased pain after therapies. No s/s of skin breakdown.  ? ?Ratio 1:3 ?Rate 35 pps ?Waveform- Asymmetric ?Ramp 1.0 ?Pulse 300 ?Intensity- 25 ?Duration - 10 min ? ?No adverse reactions after treatment and is skin intact.  ? ? ? ?Therapy Documentation ?Precautions:  ?Precautions ?Precautions: Fall ?Restrictions ?Weight Bearing Restrictions: No ?General: ?  ?Therapy/Group: Individual Therapy ? ?Nakkia Mackiewicz R Howerton-Davis ?  05/13/2021, 12:07 PM ?

## 2021-05-13 NOTE — Patient Care Conference (Signed)
Inpatient RehabilitationTeam Conference and Plan of Care Update ?Date: 05/13/2021   Time: 11:44 AM  ? ? ?Patient Name: Aaron Dennis      ?Medical Record Number: [id number]  ?Date of Birth: 1955/06/07 ?Sex: Male         ?Room/Bed: 0Z60F/0X32T-55 ?Payor Info: Payor: Marine scientist / Plan: [facility] MEDICARE / Product Type: *No Product type* /   ? ?Admit Date/Time:  04/29/2021  3:52 PM ? ?Primary Diagnosis:  Stroke (cerebrum) ([place]) ? ?Hospital Problems: Principal Problem: ?  Stroke (cerebrum) ([place]) ?Active Problems: ?  Status post stroke due to cerebrovascular disease ? ? ? ?Expected Discharge Date: Expected Discharge Date: 05/20/21 ? ?Team Members Present: ?Physician leading conference: Dr. Courtney Heys ?Social Worker Present: Ovidio Kin, LCSW ?Nurse Present: Dorthula Nettles, RN ?PT Present: Ailene Rud, PT ?OT Present: Turner Daniels, OT ?PPS Coordinator present : Gunnar Fusi, SLP ? ?   Current Status/Progress Goal Weekly Team Focus  ?Bowel/Bladder ? ? Continent of B/B.Marland KitchenOccasional constipation.  LBM 05/12/21  Remain continent.  Encourage fluid intake. Assess Q shift.   ?Swallow/Nutrition/ Hydration ? ?           ?ADL's ? ? Min A UB/LB ADLS; Min A toilet transfers with hemi-walker and givmohr sling; flaccid RUE but responding minimally to NMES  supervsion overall  Self-care re-education; RUE NMR; ADL transfers; patient/family ed.; dynamic sitting/standing balance   ?Mobility ? ? min A STS and gait with hemi walker up to 180 ft. limited by L sided neck pain from triggerpoints in scalenes, SCM, traps, levator scap  supervision-CGA  gait, transfers   ?Communication ? ?           ?Safety/Cognition/ Behavioral Observations ?           ?Pain ? ? Denies Pain  Remain pain free  Assess Q shift and prn   ?Skin ? ? Skin Intact  Maintain Skin integrity  Prevent new breakdown. Assess Q shift.   ? ? ?Discharge Planning:  ?Home with wife who has health issues-knee issues can not physically assist pt. Needs to be  supervision for ambulation if possible. Son here daily   ?Team Discussion: ?Reports shoulder pain, appears to be positional. Lidocaine patch added for neck pain. Scheduled Tylenol, Trazodone added for sleep. Sorbitol given for bowel movement. K+ low, ordered 10 mEq daily. E-stim to shoulder. Continent B/B, no reported pain to nursing. RUE flaccid. Discharging home with spouse/son. Lots of family assistance. ? ?Patient on target to meet rehab goals: ?yes, supervision/CGA goals. Currently min assist overall. Ambulates 153f. Showered with min assist. Soft tissue massage provided to shoulder. ? ?*See Care Plan and progress notes for long and short-term goals.  ? ?Revisions to Treatment Plan:  ?Adjusting medications. ?  ?Teaching Needs: ?Family education, medication/pain management, transfer/gait training, etc. ?  ?Current Barriers to Discharge: ?Medication compliance and Behavior ? ?Possible Resolutions to Barriers: ?Family education ?  ? ? Medical Summary ?Current Status: doing estim for R shoulder- did soft tissue massage for R scalenes- and upper trap- some return in R arm- continent- constipation resolved ? Barriers to Discharge: Decreased family/caregiver support;Behavior;Weight bearing restrictions;Medical stability;Home enviroment access/layout;Medication compliance ? Barriers to Discharge Comments: hypokalemia-- subluxation of R shoulder- home with wife/son; ?Possible Resolutions to BCelanese CorporationFocus: min A- walking 180 ft- Hemiwalker- not pretty- will order lidocaine patches for neck myofascial pain- d/c 4/11 ? ? ?Continued Need for Acute Rehabilitation Level of Care: The patient requires daily medical management by a physician with specialized training in  physical medicine and rehabilitation for the following reasons: ?Direction of a multidisciplinary physical rehabilitation program to maximize functional independence : Yes ?Medical management of patient stability for increased activity during  participation in an intensive rehabilitation regime.: Yes ?Analysis of laboratory values and/or radiology reports with any subsequent need for medication adjustment and/or medical intervention. : Yes ? ? ?I attest that I was present, lead the team conference, and concur with the assessment and plan of the team. ? ? ?Dorthula Nettles G ?05/13/2021, 5:40 PM  ? ? ? ? ? ? ?

## 2021-05-13 NOTE — Progress Notes (Signed)
Patient ID: Aaron Dennis, male   DOB: 1955-07-16, 66 y.o.   MRN: [id number]  Met with pt and son also present to give team conference update regarding progress this week and still aiming for discharge on 4/11. He feels better about date and did not mention not having any caregivers at home. Son reports people will be in and out when he goes home and pt's wife is there but unable to assist him. Discussed waiting for therapy team to recommend equipment and will work on follow up therapies at home. Pt was appreciative and was in agreement of plan. Reports he has no equipment at home. ?

## 2021-05-13 NOTE — Progress Notes (Signed)
?                                                       PROGRESS NOTE ? ? ?Subjective/Complaints: ? ? ?R side of head hurting a little this AM- therapy worked on muscles this AM ?LBM yesterday x2.  ? ?R shoulder still heavy.  ?ROS:  ? ?Pt denies SOB, abd pain, CP, N/V/C/D, and vision changes ? ?Objective: ?  ?No results found. ?Recent Labs  ?  05/12/21 ?0737  ?WBC 7.1  ?HGB 16.4  ?HCT 49.4  ?PLT 257  ? ? ? ?Recent Labs  ?  05/12/21 ?0737  ?NA 139  ?K 3.4*  ?CL 104  ?CO2 28  ?GLUCOSE 101*  ?BUN 18  ?CREATININE 0.95  ?CALCIUM 9.1  ? ? ? ?Intake/Output Summary (Last 24 hours) at 05/13/2021 0916 ?Last data filed at 05/12/2021 1816 ?Gross per 24 hour  ?Intake 298 ml  ?Output --  ?Net 298 ml  ?  ? ?  ? ?Physical Exam: ?Vital Signs ?Blood pressure 125/82, pulse 76, temperature 98.5 ?F (36.9 ?C), resp. rate 14, height '5\' 10"'$  (1.778 m), weight 76.3 kg, SpO2 99 %. ? ? ? ? ? ? ? ? ?General: awake, alert, appropriate, sitting up in w/c at bedside; NAD ?HENT: conjugate gaze; oropharynx moist ?CV: regular rate; no JVD ?Pulmonary: CTA B/L; no W/R/R- good air movement ?GI: soft, NT, ND, (+)BS ?Psychiatric: appropriate ?Neurological: Ox3 ? ? ?Skin: ?   General: Skin is warm and dry.  ? ? MS: R shoulder -- however has 2 finger subluxation noted this AM- no activation- same this AM- TTP over middle/posterior scalenes on R ?Neurological:  ?   Mental Status: He is alert and oriented to person, place, and time.  ?   Comments: Speech is at baseline per pt= per son Arabic, primary language is good ?Intact to light touch x 4 extremities. Motor 5/5 LUE and LLE. RUE 1/5 finger flexors and triceps . RLE 2+/5 prox to distal.  ? ? ?Assessment/Plan: ?1. Functional deficits which require 3+ hours per day of interdisciplinary therapy in a comprehensive inpatient rehab setting. ?Physiatrist is providing close team supervision and 24 hour management of active medical problems listed below. ?Physiatrist and rehab team continue to assess barriers to  discharge/monitor patient progress toward functional and medical goals ? ?Care Tool: ? ?Bathing ? Bathing activity did not occur: Safety/medical concerns ?Body parts bathed by patient: Right arm, Chest, Front perineal area, Abdomen, Right upper leg, Left upper leg, Face  ? Body parts bathed by helper: Left arm, Buttocks, Left lower leg, Right lower leg ?  ?  ?Bathing assist Assist Level: Moderate Assistance - Patient 50 - 74% ?  ?  ?Upper Body Dressing/Undressing ?Upper body dressing Upper body dressing/undressing activity did not occur (including orthotics): Safety/medical concerns ?What is the patient wearing?: Pull over shirt ?   ?Upper body assist Assist Level: Moderate Assistance - Patient 50 - 74% ?   ?Lower Body Dressing/Undressing ?Lower body dressing ? ? ? Lower body dressing activity did not occur: Safety/medical concerns ?What is the patient wearing?: Pants, Underwear/pull up ? ?  ? ?Lower body assist Assist for lower body dressing: Maximal Assistance - Patient 25 - 49% ?   ? ?Toileting ?Toileting Toileting Activity did not occur Landscape architect and hygiene only): N/A (  no void or bm)  ?Toileting assist Assist for toileting: Maximal Assistance - Patient 25 - 49% ?  ?  ?Transfers ?Chair/bed transfer ? ?Transfers assist ? Chair/bed transfer activity did not occur: Safety/medical concerns ? ?Chair/bed transfer assist level: Minimal Assistance - Patient > 75% ?  ?  ?Locomotion ?Ambulation ? ? ?Ambulation assist ? ? Ambulation activity did not occur: Safety/medical concerns ? ?Assist level: Minimal Assistance - Patient > 75% ?Assistive device: Walker-hemi ?Max distance: 35  ? ?Walk 10 feet activity ? ? ?Assist ? Walk 10 feet activity did not occur: Safety/medical concerns ? ?Assist level: Minimal Assistance - Patient > 75% ?Assistive device: Walker-hemi  ? ?Walk 50 feet activity ? ? ?Assist Walk 50 feet with 2 turns activity did not occur: Safety/medical concerns ? ?Assist level: Minimal Assistance -  Patient > 75% ?Assistive device: Walker-hemi  ? ? ?Walk 150 feet activity ? ? ?Assist Walk 150 feet activity did not occur: Safety/medical concerns ? ?  ?  ?  ? ?Walk 10 feet on uneven surface  ?activity ? ? ?Assist Walk 10 feet on uneven surfaces activity did not occur: Safety/medical concerns ? ? ?  ?   ? ?Wheelchair ? ? ? ? ?Assist Is the patient using a wheelchair?: Yes ?Type of Wheelchair: Manual ?  ? ?Wheelchair assist level: Minimal Assistance - Patient > 75% ?Max wheelchair distance: 20  ? ? ?Wheelchair 50 feet with 2 turns activity ? ? ? ?Assist ? ?  ?  ? ? ?   ? ?Wheelchair 150 feet activity  ? ? ? ?Assist ?   ? ? ?   ? ?Blood pressure 125/82, pulse 76, temperature 98.5 ?F (36.9 ?C), resp. rate 14, height '5\' 10"'$  (1.778 m), weight 76.3 kg, SpO2 99 %. ? ?Medical Problem List and Plan: ?1. Functional deficits secondary to acute left frontoparietal infarct ?            -patient may  shower ?            -ELOS/Goals: 7-10 days supervision ? D/c 4/11 ? Con't PT, OT and estim ? Team conference today to f/u on progress ?2.  Antithrombotics: ?-DVT/anticoagulation:  Pharmaceutical: Lovenox ?            -antiplatelet therapy: Plavix and aspirin for 3 weeks then aspirin alone ?3. Pain Management: Tylenol ? 3/29- schedule tylenol at 8pm for R shoulder pain- wait on steroid injection of R shoulder for now; also wait on prevention meds for HA's- suggest pt ask for tylenol prn for now ? 3/30- pain in R shoulder better last night- con't regimen ? 4/4- doing estim for R shoulder ?4. Mood: LCSW to evaluate and provide emotional support ? 3/23- pt c/o feeling depressed over stroke- will start Celexa 20 mg daily. ?3/27- improved mood with celexa- con't regimen   ?            -antipsychotic agents: Not applicable ?5. Neuropsych: This patient is capable of making decisions on his own behalf. ?6. Skin/Wound Care: Routine skin care checks ?    ?7. Fluids/Electrolytes/Nutrition: Routine I's and O's and follow-up chemistries ?8:  Cerebral aneurysm: follow-up IR as outpatient ?9: Hyperlipidemia:: continue Crestor 20 mg daily ?10: Essential hypertension: Continue amlodipine 5 mg daily ?Vitals:  ? 05/12/21 1942 05/13/21 0445  ?BP: 127/80 125/82  ?Pulse: 82 76  ?Resp: 14 14  ?Temp: 98.4 ?F (36.9 ?C) 98.5 ?F (36.9 ?C)  ?SpO2: 99% 99%  ?4/4- BP controlled- con't regimen ?11: Polycythemia: Follow-up  medical oncology as outpatient ?12: GI prophylaxis: Continue Protonix ?13: BPH: Continue Flomax ? 3/25- peeing well- con't flomax ?14. Constipation- LBM 3 days ago- will give miralax prn ?Pt requesting stool softener ?4/3- will give sorbitol after therapy today 30cc- has been 3 days ?4/4- Had 2 Bms with sorbitol- feeling better ? 15/ hypokalemia ? 3/22- will replete 40 mEq x1 and recheck  ?3/27- K+ 3.7- con't regimen off K+. ? 3/31- labs Monday ?4/3- K+ 3.4- will give 40 Meq x1- and will likely need 10 mEq daily? Will d/w team tomorrow ?4/4- will start 10 mEq daily.  ?16. Insomnia ?3/28- Will try Low dose Trazodone and monitor for urinary retention- 50 mg QHS-   ? 3/29- will change trazodone to 100 mg QHS and give at [place] well.  ?3/31- slept 8+ hours last night ?17. Dispo ?3/22- 1 family member can stay overnight. ?3/29- d/c date set for 4/11-  ? ? ?I spent a total of 37   minutes on total care today- >50% coordination of care- due to team conference today and discussing with therapy ? ? ? ? ? ? ?LOS: ?14 days ?A FACE TO FACE EVALUATION WAS PERFORMED ? ?Arshdeep Bolger ?05/13/2021, 9:16 AM  ? ? ? ?

## 2021-05-14 MED ORDER — POLYETHYLENE GLYCOL 3350 17 G PO PACK
17.0000 g | PACK | Freq: Every day | ORAL | Status: DC
  Filled 2021-05-14 (×6): qty 1

## 2021-05-14 MED ORDER — LIDOCAINE 5 % EX PTCH
1.0000 | MEDICATED_PATCH | CUTANEOUS | Status: DC
  Administered 2021-05-14 – 2021-05-17 (×4): 1 via TRANSDERMAL
  Filled 2021-05-14 (×6): qty 1

## 2021-05-14 NOTE — Progress Notes (Signed)
Occupational Therapy Session Note ? ?Patient Details  ?Name: DEPAUL ARIZPE ?MRN: [id number] ?Date of Birth: 31-Oct-1955 ? ?Today's Date: 05/14/2021 ? ?OT Individual Time: 3016-0109 ?OT Individual Time Calculation (min): 62 min  ? ?OT Individual Time: 3235-5732 ?OT Individual Time Calculation (min): 68 min  ? ? ?Short Term Goals: ?Week 1:  OT Short Term Goal 1 (Week 1): Patient will don UB clothing with good recall of hemi technique and Min A with no more than 2 verbal cues. ?OT Short Term Goal 1 - Progress (Week 1): Progressing toward goal ?OT Short Term Goal 2 (Week 1): Patient complete 1/3 parts of toileting task with Mod A and LRAD. ?OT Short Term Goal 2 - Progress (Week 1): Met ?OT Short Term Goal 3 (Week 1): Patient will don LB clothing with hemi technique and Mod A with LRAD. ?OT Short Term Goal 3 - Progress (Week 1): Met ?OT Short Term Goal 4 (Week 1): Patient will complete squat-pivot to [facility] with Min A. ?OT Short Term Goal 4 - Progress (Week 1): Progressing toward goal ?Week 2:  OT Short Term Goal 1 (Week 2): Pt will don LB clothing with min A overall ?OT Short Term Goal 2 (Week 2): Pt will complete transfer to [facility] with min A ?OT Short Term Goal 3 (Week 2): Pt will complete UB dressing with min A, no more than 2 cues for hemi technique ? ?Skilled Therapeutic Interventions/Progress Updates:  ?Session 1: Patient met seated in wc in agreement with OT treatment session. No c/p pain at rest. At time of initial eval, patient was requiring bari drop-arm BSC for toilet transfers. Patient now ambulatory with Min A and hemi walker (givmohr sling with therapy). Wide drop-arm BSC switched to standard BSC. Patient then completed ambulatory toilet transfer to [facility] with Min A. Functional mobility to rehab gym with Min A. Occasional LOB requiring Min A to correct. Focus shifted to RUE NMR RUE weightbearing exercises in standing. Standing push-ups x10 reps with support at RUE. Patient then competed hand to elbow transitions in  standing with support at RUE and cues for neutral position of R knee. Transitioned to sitting on mat table. Lateral leans on R then L to improve strength of truncal musculature. Total A for wc transport back to hospital room for time management. Returned to supine with S. Session concluded with patient lying supine in bed with call bell within reach, bed alarm activated and all needs met.  ? ?Session 2: Patient met lying supine in bed in agreement with OT treatment session. Denies pain at rest and with activity. Patient reports disliking hospital food. Education on family providing meals from home that patient enjoys. Patient states that he does not like the alterations to his food including decreased salt and other modifications suggested by MD and would rather not eat. Encouraged patient to speak with family about options for meals that are within MD's guidelines and also taste good. Patient expressed verbal understanding. Functional mobility from room to rehab gym with Min A. Static standing balance with tabletop task to improve independence with ADLs. Patient able to stand for 2 bouts x 2-3 minutes each without LOB and CGA. Focus then shifted to strengthening truncal musculature with seated sit-ups tasking patient with punching foam mat with boxing glove upon sitting.  ?Mobility from rehab gym to ADL apartment for simulated tub/shower transfer with patient completing task with CGA-Min A at RLE. Education on obtaining [facility], tub bench and givmohr sling as these items are not covered  by insurance. Session concluded with patient lying supine in bed with call bell within reach, bed alarm activated and all needs met.  ? ?Therapy Documentation ?Precautions:  ?Precautions ?Precautions: Fall ?Restrictions ?Weight Bearing Restrictions: No ?General: ?  ?Therapy/Group: Individual Therapy ? ?Mishael Krysiak R Howerton-Davis ?05/14/2021, 2:28 PM ?

## 2021-05-14 NOTE — Progress Notes (Signed)
?                                                       PROGRESS NOTE ? ? ?Subjective/Complaints: ? ? ?R neck pain is much better after myofascial release by therapy.  ?LBM 2 nights ago.  ? ?Willing to try lidoderm patch to help maintain R neck muscles looser.  ? ? ?ROS: ? ?Pt denies SOB, abd pain, CP, N/V/C/D, and vision changes ? ?Objective: ?  ?No results found. ?Recent Labs  ?  05/12/21 ?0737  ?WBC 7.1  ?HGB 16.4  ?HCT 49.4  ?PLT 257  ? ? ? ?Recent Labs  ?  05/12/21 ?0737  ?NA 139  ?K 3.4*  ?CL 104  ?CO2 28  ?GLUCOSE 101*  ?BUN 18  ?CREATININE 0.95  ?CALCIUM 9.1  ? ? ? ?Intake/Output Summary (Last 24 hours) at 05/14/2021 0813 ?Last data filed at 05/14/2021 0751 ?Gross per 24 hour  ?Intake 118 ml  ?Output 900 ml  ?Net -782 ml  ?  ? ?  ? ?Physical Exam: ?Vital Signs ?Blood pressure 112/78, pulse 67, temperature 98.2 ?F (36.8 ?C), resp. rate 14, height '5\' 10"'$  (1.778 m), weight 76.3 kg, SpO2 100 %. ? ? ? ? ? ? ? ? ? ?General: awake, alert, appropriate, sitting up in bedside chair;  NAD ?HENT: conjugate gaze; oropharynx moist ?CV: regular rate; no JVD ?Pulmonary: CTA B/L; no W/R/R- good air movement ?GI: soft, NT, ND, (+)BS ?Psychiatric: appropriate ?Neurological: Ox3 ? ?Skin: ?   General: Skin is warm and dry.  ? ? MS: R shoulder -- however has 2 finger subluxation noted this AM- no activation- same this AM- TTP over middle/posterior scalenes on R- also in upper trap, but better today- more loose- 1/5 grip this AM ?Neurological:  ?   Mental Status: He is alert and oriented to person, place, and time.  ?   Comments: Speech is at baseline per pt= per son Arabic, primary language is good ?Intact to light touch x 4 extremities. Motor 5/5 LUE and LLE. RUE 1/5 finger flexors and triceps . RLE 2+/5 prox to distal.  ? ? ?Assessment/Plan: ?1. Functional deficits which require 3+ hours per day of interdisciplinary therapy in a comprehensive inpatient rehab setting. ?Physiatrist is providing close team supervision and 24 hour  management of active medical problems listed below. ?Physiatrist and rehab team continue to assess barriers to discharge/monitor patient progress toward functional and medical goals ? ?Care Tool: ? ?Bathing ? Bathing activity did not occur: Safety/medical concerns ?Body parts bathed by patient: Right arm, Chest, Front perineal area, Abdomen, Right upper leg, Left upper leg, Face  ? Body parts bathed by helper: Left arm, Buttocks, Left lower leg, Right lower leg ?  ?  ?Bathing assist Assist Level: Moderate Assistance - Patient 50 - 74% ?  ?  ?Upper Body Dressing/Undressing ?Upper body dressing Upper body dressing/undressing activity did not occur (including orthotics): Safety/medical concerns ?What is the patient wearing?: Pull over shirt ?   ?Upper body assist Assist Level: Moderate Assistance - Patient 50 - 74% ?   ?Lower Body Dressing/Undressing ?Lower body dressing ? ? ? Lower body dressing activity did not occur: Safety/medical concerns ?What is the patient wearing?: Pants, Underwear/pull up ? ?  ? ?Lower body assist Assist for lower body dressing: Maximal Assistance -  Patient 25 - 49% ?   ? ?Toileting ?Toileting Toileting Activity did not occur (Probation officer and hygiene only): N/A (no void or bm)  ?Toileting assist Assist for toileting: Maximal Assistance - Patient 25 - 49% ?  ?  ?Transfers ?Chair/bed transfer ? ?Transfers assist ? Chair/bed transfer activity did not occur: Safety/medical concerns ? ?Chair/bed transfer assist level: Minimal Assistance - Patient > 75% ?  ?  ?Locomotion ?Ambulation ? ? ?Ambulation assist ? ? Ambulation activity did not occur: Safety/medical concerns ? ?Assist level: Minimal Assistance - Patient > 75% ?Assistive device: Walker-hemi ?Max distance: 35  ? ?Walk 10 feet activity ? ? ?Assist ? Walk 10 feet activity did not occur: Safety/medical concerns ? ?Assist level: Minimal Assistance - Patient > 75% ?Assistive device: Walker-hemi  ? ?Walk 50 feet activity ? ? ?Assist Walk  50 feet with 2 turns activity did not occur: Safety/medical concerns ? ?Assist level: Minimal Assistance - Patient > 75% ?Assistive device: Walker-hemi  ? ? ?Walk 150 feet activity ? ? ?Assist Walk 150 feet activity did not occur: Safety/medical concerns ? ?  ?  ?  ? ?Walk 10 feet on uneven surface  ?activity ? ? ?Assist Walk 10 feet on uneven surfaces activity did not occur: Safety/medical concerns ? ? ?  ?   ? ?Wheelchair ? ? ? ? ?Assist Is the patient using a wheelchair?: Yes ?Type of Wheelchair: Manual ?  ? ?Wheelchair assist level: Minimal Assistance - Patient > 75% ?Max wheelchair distance: 20  ? ? ?Wheelchair 50 feet with 2 turns activity ? ? ? ?Assist ? ?  ?  ? ? ?   ? ?Wheelchair 150 feet activity  ? ? ? ?Assist ?   ? ? ?   ? ?Blood pressure 112/78, pulse 67, temperature 98.2 ?F (36.8 ?C), resp. rate 14, height '5\' 10"'$  (1.778 m), weight 76.3 kg, SpO2 100 %. ? ?Medical Problem List and Plan: ?1. Functional deficits secondary to acute left frontoparietal infarct ?            -patient may  shower ?            -ELOS/Goals: 7-10 days supervision ? D/c 4/11 ? Con't PT and OT_ CIR- estim for RUE- getting some results. 1/5 grip today.  ?2.  Antithrombotics: ?-DVT/anticoagulation:  Pharmaceutical: Lovenox ?            -antiplatelet therapy: Plavix and aspirin for 3 weeks then aspirin alone ?3. Pain Management: Tylenol ? 3/29- schedule tylenol at 8pm for R shoulder pain- wait on steroid injection of R shoulder for now; also wait on prevention meds for HA's- suggest pt ask for tylenol prn for now ? 3/30- pain in R shoulder better last night- con't regimen ? 4/4- doing estim for R shoulder ?4/5- will add lidoderm patch for R upper traps 8pm to 8am to help with muscle tightness ?4. Mood: LCSW to evaluate and provide emotional support ? 3/23- pt c/o feeling depressed over stroke- will start Celexa 20 mg daily. ?3/27- improved mood with celexa- con't regimen   ?            -antipsychotic agents: Not applicable ?5.  Neuropsych: This patient is capable of making decisions on his own behalf. ?6. Skin/Wound Care: Routine skin care checks ?    ?7. Fluids/Electrolytes/Nutrition: Routine I's and O's and follow-up chemistries ?8: Cerebral aneurysm: follow-up IR as outpatient ?9: Hyperlipidemia:: continue Crestor 20 mg daily ?10: Essential hypertension: Continue amlodipine 5 mg daily ?Vitals:  ?  05/13/21 2026 05/14/21 0522  ?BP: 118/77 112/78  ?Pulse: 70 67  ?Resp: 14 14  ?Temp: 98.4 ?F (36.9 ?C) 98.2 ?F (36.8 ?C)  ?SpO2: 100% 100%  ?4/4- BP controlled- con't regimen ?11: Polycythemia: Follow-up medical oncology as outpatient ?12: GI prophylaxis: Continue Protonix ?13: BPH: Continue Flomax ? 3/25- peeing well- con't flomax ?14. Constipation- LBM 3 days ago- will give miralax prn ?Pt requesting stool softener ?4/3- will give sorbitol after therapy today 30cc- has been 3 days ?4/4- Had 2 Bms with sorbitol- feeling better ? 4/5- LBM 2 nights ago- denies constipation- will wait 1 more day and then intervene with sorbitol- but add Miralax daily ot regimen- already on senna 2 tabs BID ? 15/ hypokalemia ? 3/22- will replete 40 mEq x1 and recheck  ?3/27- K+ 3.7- con't regimen off K+. ? 3/31- labs Monday ?4/3- K+ 3.4- will give 40 Meq x1- and will likely need 10 mEq daily? Will d/w team tomorrow ?4/4- will start 10 mEq daily.  ?16. Insomnia ?3/28- Will try Low dose Trazodone and monitor for urinary retention- 50 mg QHS-   ? 3/29- will change trazodone to 100 mg QHS and give at [place] well.  ?3/31- slept 8+ hours last night ?17. Dispo ?3/22- 1 family member can stay overnight. ?3/29- d/c date set for 4/11-  ? ? ? ? ? ? ?LOS: ?15 days ?A FACE TO FACE EVALUATION WAS PERFORMED ? ?Roald Lukacs ?05/14/2021, 8:13 AM  ? ? ? ?

## 2021-05-14 NOTE — Progress Notes (Signed)
Physical Therapy Session Note ? ?Patient Details  ?Name: Aaron Dennis ?MRN: [id number] ?Date of Birth: 1955/12/09 ? ?Today's Date: 05/14/2021 ?PT Individual Time: 0930-1000 ?PT Individual Time Calculation (min): 30 min  ? ?Short Term Goals: ?Week 1:  PT Short Term Goal 1 (Week 1): Pt will initate gait training ?PT Short Term Goal 1 - Progress (Week 1): Met ?PT Short Term Goal 2 (Week 1): pt will propel w/c with min A or better ?PT Short Term Goal 2 - Progress (Week 1): Met ?PT Short Term Goal 3 (Week 1): Pt will perform squat pivot with min A or better ?PT Short Term Goal 3 - Progress (Week 1): Progressing toward goal ?Week 2:  PT Short Term Goal 1 (Week 2): Pt will ambulate 52f with min assist consistently ?PT Short Term Goal 2 (Week 2): Pt will propell WC >1515fwith supevision assist ?PT Short Term Goal 3 (Week 2): Pt will trasnfer to and from bed with min assist consistently ?PT Short Term Goal 4 (Week 2): Pt wil ascend 3 steps with mod assist and LRAD ? ? ?Skilled Therapeutic Interventions/Progress Updates:  ?Patient seated upright in w/c on entrance to room. Patient alert and agreeable to PT session. Family present. ? ?Patient with no pain complaint throughout session. ? ?Therapeutic Activity: ?Transfers: Patient performed sit<>stand and stand pivot transfers throughout session with supervision up to miBuckhornAttempted NMR for improved sit<>stand technique with no UE support. See NMR for details. Provided verbal cues for technique throughout. ? ?Gait Training:  ?Patient ambulated 7027x1 using HW with R AFO donned with pt demonstrating  forward flexed posture and difficulty in advancing R foot. Also requires assist with R hip extension and intermittent hip placement/ positioning. Overall CGA with int MinA provided.  ? ?AFO doffed and pt ambulates same distance with improved upright posture and RLE advancement. Intermittent increase in R knee flexion during stance phase without buckle.  ? ?Neuromuscular Re-ed: ?NMR  facilitated during session with focus on standing balance. Pt guided in sit<>stand training with NDT cueing. Pt unable to produce adequate forward lean in order to efficiently rise to stand or control descent to sit. Continues in attempt to remain balance over heels in rise to stand requiring heavy MinA to attain balance.  NMR performed for improvements in motor control and coordination, balance, sequencing, judgement, and self confidence/ efficacy in performing all aspects of mobility at highest level of independence.  ? ?Patient seated  in w/c at end of session with brakes locked, belt alarm set, and all needs within reach. Family present. ? ? ?Therapy Documentation ?Precautions:  ?Precautions ?Precautions: Fall ?Restrictions ?Weight Bearing Restrictions: No ?General: ?  ?Vital Signs: ?Therapy Vitals ?Temp: 98.2 ?F (36.8 ?C) ?Pulse Rate: 67 ?Resp: 14 ?BP: 112/78 ?Patient Position (if appropriate): Lying ?Oxygen Therapy ?SpO2: 100 % ?O2 Device: Room Air ?Pain: ? No outward pain complaint, but does move neck as though it was sore.  ? ?Therapy/Group: Individual Therapy ? ?JuAlger SimonsT, DPT ?05/14/2021, 8:59 AM  ?

## 2021-05-14 NOTE — Progress Notes (Signed)
Physical Therapy Weekly Progress Note ? ?Patient Details  ?Name: Aaron Dennis ?MRN: [id number] ?Date of Birth: 1955/04/03 ? ?Beginning of progress report period: May 07, 2021 ?End of progress report period: May 14, 2021 ? ?Today's Date: 05/14/2021 ?PT Individual Time: 5465-0354 ?PT Individual Time Calculation (min): 58 min  and Today's Date: 05/14/2021 ?PT Missed Time: 20 Minutes ?Missed Time Reason: Patient ill (Comment);Patient unwilling to participate (near syncopal episode) ? ?Patient has met 3 of 4 short term goals.  Pt ambulating up to 170 ft with hemi walker and min A, propelling w/c with supervision. Pt demoes appropriate progress for rehab course.  ? ?Patient continues to demonstrate the following deficits muscle weakness, decreased cardiorespiratoy endurance, impaired timing and sequencing, abnormal tone, decreased coordination, and decreased motor planning, and hemiplegia and therefore will continue to benefit from skilled PT intervention to increase functional independence with mobility. ? ?Patient progressing toward long term goals..  Continue plan of care. ? ?PT Short Term Goals ?Week 2:  PT Short Term Goal 1 (Week 2): Pt will ambulate 75f with min assist consistently ?PT Short Term Goal 1 - Progress (Week 2): Met ?PT Short Term Goal 2 (Week 2): Pt will propell WC >1559fwith supevision assist ?PT Short Term Goal 2 - Progress (Week 2): Met ?PT Short Term Goal 3 (Week 2): Pt will trasnfer to and from bed with min assist consistently ?PT Short Term Goal 3 - Progress (Week 2): Met ?PT Short Term Goal 4 (Week 2): Pt wil ascend 3 steps with mod assist and LRAD ?PT Short Term Goal 4 - Progress (Week 2): Progressing toward goal ?Week 3:  PT Short Term Goal 1 (Week 3): =LTGs d/t ELOS ? ?Skilled Therapeutic Interventions/Progress Updates:  ?  Pt seated in w/c on arrival and agreeable to therapy. Pt reports no pain in his shoulder/neck, stating that the previous days trigger point release had been very helpful. Pt  requested 5 minutes to finish drinking his tea. Therapist returned and discussed AD for home. Pt states he needs power assist d/t having carpet at home. Educated that manual w/c will roll on carpet and he does not qualify for power. Pt also now states he has no equipment, despite reporting to other therapists and CSW that he owns rollator and SPC. Discussed with CSW after session. ? ?Pt propelled w/c with hemi technique to/from day room for endurance and functional mobility.  ? ?Pt set up with lite gait, including stepping up onto treadmill with min A. Pt ambulated ~2 minutes x 2 with givmohr sling in place. First bout with no AFO, pt required mod- max A for RLE advancement. 2nd bout with anterior strut AFO pt with min-mod assist, however pt had near syncopal episode, having difficulty responding verbally (unsure if could not or chose not to). Pt swayed in standing with eyes nearly closed. Upon sitting, reported that episode was caused by pain of lite gait harness squeezing his groin. BP in sitting=109/69. Pt with classic s/s of orthostatic hypotension, (eyes closed, minimally responsive, BP lower than resting etc.) but insistent that reaction was from discomfort caused by harness. Pt requested to return to room and remained in w/c at end of session. Pt was left with all needs in reach and alarm active. Missed 20 min d/t near syncopal episode.  ? ?Therapy Documentation ?Precautions:  ?Precautions ?Precautions: Fall ?Restrictions ?Weight Bearing Restrictions: No ?General: ?PT Amount of Missed Time (min): 20 Minutes ?PT Missed Treatment Reason: Patient ill (Comment);Patient unwilling to participate (near syncopal  episode) ?Vital Signs prior to session: ?Therapy Vitals ?Temp: 98.2 ?F (36.8 ?C) ?Pulse Rate: 67 ?Resp: 14 ?BP: 112/78 ?Patient Position (if appropriate): Lying ?Oxygen Therapy ?SpO2: 100 % ?O2 Device: Room Air ? ? ?Therapy/Group: Individual Therapy ? ?Aaron Dennis ?05/14/2021, 9:10 AM  ?

## 2021-05-14 NOTE — Progress Notes (Addendum)
Attempted to give senna and Miralax this am. Pt agreed to take meds at lunch. Pt has put off again till later today. Will attempt to give bowel medications at later time.  ? ?Pt refused at dinner to take Miralax and Senna. Michela Pitcher would take later.  ?

## 2021-05-15 NOTE — Progress Notes (Signed)
?                                                       PROGRESS NOTE ? ? ?Subjective/Complaints: ? ? ?Refused bowel meds last night, but had good sized BM at 6:30 am this morning.  ? ?No issues otherwise.  ?Used lidoderm patch last night, but was put on the shoulder, not the upper trap/levator muscles- educated pt on how to place patch.  ?  ?ROS: ? ?Pt denies SOB, abd pain, CP, N/V/C/D, and vision changes ? ? ?Objective: ?  ?No results found. ?No results for input(s): WBC, HGB, HCT, PLT in the last 72 hours. ? ? ? ?No results for input(s): NA, K, CL, CO2, GLUCOSE, BUN, CREATININE, CALCIUM in the last 72 hours. ? ? ? ?Intake/Output Summary (Last 24 hours) at 05/15/2021 0948 ?Last data filed at 05/15/2021 0700 ?Gross per 24 hour  ?Intake 360 ml  ?Output --  ?Net 360 ml  ?  ? ?  ? ?Physical Exam: ?Vital Signs ?Blood pressure 127/74, pulse 60, temperature 97.6 ?F (36.4 ?C), resp. rate 19, height '5\' 10"'$  (1.778 m), weight 74.6 kg, SpO2 100 %. ? ? ? ? ? ? ? ? ? ? ?General: awake, alert, appropriate, sitting on EOB; NAD ?HENT: conjugate gaze; oropharynx moist ?CV: regular rate; no JVD ?Pulmonary: CTA B/L; no W/R/R- good air movement ?GI: soft, NT, ND, (+)BS ?Psychiatric: appropriate ?Neurological: Ox3 ?Skin: ?   General: Skin is warm and dry.  ? ? MS: R shoulder -- however has 2 finger subluxation noted this AM- no activation- same this AM- TTP over middle/posterior scalenes on R- also in upper trap, but better today- more loose- 1/5 grip this AM ?Neurological:  ?   Mental Status: He is alert and oriented to person, place, and time.  ?   Comments: Speech is at baseline per pt= per son Arabic, primary language is good ?Intact to light touch x 4 extremities. Motor 5/5 LUE and LLE. RUE 1/5 finger flexors and triceps . RLE 2+/5 prox to distal.  ? ? ?Assessment/Plan: ?1. Functional deficits which require 3+ hours per day of interdisciplinary therapy in a comprehensive inpatient rehab setting. ?Physiatrist is providing close team  supervision and 24 hour management of active medical problems listed below. ?Physiatrist and rehab team continue to assess barriers to discharge/monitor patient progress toward functional and medical goals ? ?Care Tool: ? ?Bathing ? Bathing activity did not occur: Safety/medical concerns ?Body parts bathed by patient: Right arm, Chest, Front perineal area, Abdomen, Right upper leg, Left upper leg, Face  ? Body parts bathed by helper: Left arm, Buttocks, Left lower leg, Right lower leg ?  ?  ?Bathing assist Assist Level: Moderate Assistance - Patient 50 - 74% ?  ?  ?Upper Body Dressing/Undressing ?Upper body dressing Upper body dressing/undressing activity did not occur (including orthotics): Safety/medical concerns ?What is the patient wearing?: Pull over shirt ?   ?Upper body assist Assist Level: Moderate Assistance - Patient 50 - 74% ?   ?Lower Body Dressing/Undressing ?Lower body dressing ? ? ? Lower body dressing activity did not occur: Safety/medical concerns ?What is the patient wearing?: Pants, Underwear/pull up ? ?  ? ?Lower body assist Assist for lower body dressing: Maximal Assistance - Patient 25 - 49% ?   ? ?Toileting ?Toileting Toileting  Activity did not occur (Clothing management and hygiene only): N/A (no void or bm)  ?Toileting assist Assist for toileting: Maximal Assistance - Patient 25 - 49% ?  ?  ?Transfers ?Chair/bed transfer ? ?Transfers assist ? Chair/bed transfer activity did not occur: Safety/medical concerns ? ?Chair/bed transfer assist level: Minimal Assistance - Patient > 75% ?  ?  ?Locomotion ?Ambulation ? ? ?Ambulation assist ? ? Ambulation activity did not occur: Safety/medical concerns ? ?Assist level: Minimal Assistance - Patient > 75% ?Assistive device: Walker-hemi ?Max distance: 35  ? ?Walk 10 feet activity ? ? ?Assist ? Walk 10 feet activity did not occur: Safety/medical concerns ? ?Assist level: Minimal Assistance - Patient > 75% ?Assistive device: Walker-hemi  ? ?Walk 50 feet  activity ? ? ?Assist Walk 50 feet with 2 turns activity did not occur: Safety/medical concerns ? ?Assist level: Minimal Assistance - Patient > 75% ?Assistive device: Walker-hemi  ? ? ?Walk 150 feet activity ? ? ?Assist Walk 150 feet activity did not occur: Safety/medical concerns ? ?  ?  ?  ? ?Walk 10 feet on uneven surface  ?activity ? ? ?Assist Walk 10 feet on uneven surfaces activity did not occur: Safety/medical concerns ? ? ?  ?   ? ?Wheelchair ? ? ? ? ?Assist Is the patient using a wheelchair?: Yes ?Type of Wheelchair: Manual ?  ? ?Wheelchair assist level: Minimal Assistance - Patient > 75% ?Max wheelchair distance: 20  ? ? ?Wheelchair 50 feet with 2 turns activity ? ? ? ?Assist ? ?  ?  ? ? ?   ? ?Wheelchair 150 feet activity  ? ? ? ?Assist ?   ? ? ?   ? ?Blood pressure 127/74, pulse 60, temperature 97.6 ?F (36.4 ?C), resp. rate 19, height '5\' 10"'$  (1.778 m), weight 74.6 kg, SpO2 100 %. ? ?Medical Problem List and Plan: ?1. Functional deficits secondary to acute left frontoparietal infarct ?            -patient may  shower ?            -ELOS/Goals: 7-10 days supervision ? D/c 4/11 ? Con't CIR- PT and OT ?2.  Antithrombotics: ?-DVT/anticoagulation:  Pharmaceutical: Lovenox ?            -antiplatelet therapy: Plavix and aspirin for 3 weeks then aspirin alone ?3. Pain Management: Tylenol ? 3/29- schedule tylenol at 8pm for R shoulder pain- wait on steroid injection of R shoulder for now; also wait on prevention meds for HA's- suggest pt ask for tylenol prn for now ? 3/30- pain in R shoulder better last night- con't regimen ? 4/4- doing estim for R shoulder ?4/5- will add lidoderm patch for R upper traps 8pm to 8am to help with muscle tightness ? 4/6- was put on wrong part- educated pt on how to place patch- took for patch this AM- is due ?4. Mood: LCSW to evaluate and provide emotional support ? 3/23- pt c/o feeling depressed over stroke- will start Celexa 20 mg daily. ?3/27- improved mood with celexa- con't  regimen   ?            -antipsychotic agents: Not applicable ?5. Neuropsych: This patient is capable of making decisions on his own behalf. ?6. Skin/Wound Care: Routine skin care checks ?    ?7. Fluids/Electrolytes/Nutrition: Routine I's and O's and follow-up chemistries ?8: Cerebral aneurysm: follow-up IR as outpatient ?9: Hyperlipidemia:: continue Crestor 20 mg daily ?10: Essential hypertension: Continue amlodipine 5 mg daily ?Vitals:  ?  05/14/21 1958 05/15/21 0508  ?BP: 121/70 127/74  ?Pulse: 68 60  ?Resp: 18 19  ?Temp: 97.9 ?F (36.6 ?C) 97.6 ?F (36.4 ?C)  ?SpO2: 100% 100%  ?4/4- BP controlled- con't regimen ?11: Polycythemia: Follow-up medical oncology as outpatient ?12: GI prophylaxis: Continue Protonix ?13: BPH: Continue Flomax ? 3/25- peeing well- con't flomax ?14. Constipation- LBM 3 days ago- will give miralax prn ?Pt requesting stool softener ?4/3- will give sorbitol after therapy today 30cc- has been 3 days ?4/4- Had 2 Bms with sorbitol- feeling better ? 4/5- LBM 2 nights ago- denies constipation- will wait 1 more day and then intervene with sorbitol- but add Miralax daily ot regimen- already on senna 2 tabs BID ? 4/6- refused bowel meds yesterday, however did have BM this AM ? 15/ hypokalemia ? 3/22- will replete 40 mEq x1 and recheck  ?3/27- K+ 3.7- con't regimen off K+. ? 3/31- labs Monday ?4/3- K+ 3.4- will give 40 Meq x1- and will likely need 10 mEq daily? Will d/w team tomorrow ?4/4- will start 10 mEq daily.  ? 4/6- labs Monday  ?16. Insomnia ?3/28- Will try Low dose Trazodone and monitor for urinary retention- 50 mg QHS-   ? 3/29- will change trazodone to 100 mg QHS and give at [facility] well.  ?3/31- slept 8+ hours last night ?17. Dispo ?3/22- 1 family member can stay overnight. ?3/29- d/c date set for 4/11-  ? ? ? ? ? ? ?LOS: ?16 days ?A FACE TO FACE EVALUATION WAS PERFORMED ? ?Icelynn Onken ?05/15/2021, 9:48 AM  ? ? ? ?

## 2021-05-15 NOTE — Progress Notes (Signed)
Orthopedic Tech Progress Note ?Patient Details:  ?TIN ENGRAM ?[birth year] ?[id number] ?Called in order to [place] ?Patient ID: GLENWOOD REVOIR, male   DOB: 03-09-1955, 66 y.o.   MRN: [id number] ? ?Rafaelita Foister A Leili Eskenazi ?05/15/2021, 1:06 PM ? ?

## 2021-05-15 NOTE — Progress Notes (Signed)
Physical Therapy Session Note ? ?Patient Details  ?Name: Aaron Dennis ?MRN: [id number] ?Date of Birth: [birth year] ? ?Today's Date: 05/15/2021 ?PT Individual Time: 7342-8768 ?PT Individual Time Calculation (min): 45 min  ? ?Short Term Goals: ?Week 3:  PT Short Term Goal 1 (Week 3): =LTGs d/t ELOS ? ?Skilled Therapeutic Interventions/Progress Updates: Pt presented in bed agreeable to therapy. Pt denies pain during session. Performed bed mobility with minA and use of bed features. Performed squat pivot to w/c with CGA. Donned shoes total A for time management. MD present for am assessment with education on where lidocaine patch should be placed. Pt transported to rehab gym for time management. Performed stand pivot with CGA. Pt noted to be able to perform DF on RLE against gravity and was able to perform hamstring pulls with double banded yellow resistance band therefore no AFO used. Pt does demonstrate weakness in R quad and educated pt to continue performing LAQ as "homework". Pt performed Sit to stand x 5 with LLE on 2in step for increased RLE recruitment and forced use of RLE. Mirror feedback was provided to allow for postural corrections. Pt then participated in ambulation  ?   ? ?Therapy Documentation ?Precautions:  ?Precautions ?Precautions: Fall ?Restrictions ?Weight Bearing Restrictions: No ?General: ?  ?Vital Signs: ?Therapy Vitals ?Temp: 97.7 ?F (36.5 ?C) ?Pulse Rate: 71 ?Resp: 18 ?BP: 126/72 ?Patient Position (if appropriate): Sitting ?Oxygen Therapy ?SpO2: 100 % ?O2 Device: Room Air ?Pain: ?  ?Mobility: ?  ?Locomotion : ?   ?Trunk/Postural Assessment : ?   ?Balance: ?  ?Exercises: ?  ?Other Treatments:   ? ? ? ?Therapy/Group: Individual Therapy ? ?[name] ?05/15/2021, 4:26 PM  ?

## 2021-05-15 NOTE — Progress Notes (Addendum)
Occupational Therapy Session Note ? ?Patient Details  ?Name: Aaron Dennis ?MRN: [id number] ?Date of Birth: [birth year] ? ?Today's Date: 05/15/2021 ?OT Individual Time: 1103-1200 ?OT Individual Time Calculation (min): 57 min  ? ?OT Individual Time: 7782-4235 ?OT Individual Time Calculation (min): 34 min  ? ? ?Short Term Goals: ?Week 1:  OT Short Term Goal 1 (Week 1): Patient will don UB clothing with good recall of hemi technique and Min A with no more than 2 verbal cues. ?OT Short Term Goal 1 - Progress (Week 1): Progressing toward goal ?OT Short Term Goal 2 (Week 1): Patient complete 1/3 parts of toileting task with Mod A and LRAD. ?OT Short Term Goal 2 - Progress (Week 1): Met ?OT Short Term Goal 3 (Week 1): Patient will don LB clothing with hemi technique and Mod A with LRAD. ?OT Short Term Goal 3 - Progress (Week 1): Met ?OT Short Term Goal 4 (Week 1): Patient will complete squat-pivot to [facility] with Min A. ?OT Short Term Goal 4 - Progress (Week 1): Progressing toward goal ?Week 2:  OT Short Term Goal 1 (Week 2): Pt will don LB clothing with min A overall ?OT Short Term Goal 2 (Week 2): Pt will complete transfer to [facility] with min A ?OT Short Term Goal 3 (Week 2): Pt will complete UB dressing with min A, no more than 2 cues for hemi technique ? ?Skilled Therapeutic Interventions/Progress Updates:  ?Session 1: Patient met lying supine in bed in agreement with OT treatment session. 0/10 pain reported at rest. Patient c/o continued pain in R cervical neck into R aspect of head behind ear while sitting in wc for long periods of time in the evenings. Continued education on midline positioning in supine and seated in wc to decrease overstretching. Patient demonstrates ability to flex digits 1-5 and demonstrates slight extension with arm in gravity minimized plane. Patient also demonstrates elbow flexion from ~80 degrees to 120 degrees with extremity internally rotated. Supine to EOB and sit to stand from EOB to hemi-walker with  S. Walk-in shower transfer with CGA. Patient completed UB bathing/dressing with cues for utilization of RUE and S. LB bathing/dressing with CGA and cues for hemi technique. Patient then able to don footwear in figure-4 position with Min A. Total A for wc transport to and from therapy gym for time management. Removed k-tape at R shoulder for skin assessment. Noted several areas of redness on area along with a few scratches. Patient denies itching/scratching in the area. Plan to leave k-tape off overnight. Patient returned to supine with S. Session concluded with patient lying supine in bed with call bell within reach, bed alarm activated and all needs met.  ? ?Session 2: Patient met seated in wc in agreement with OT treatment session. Denies pain. Functional mobility to rehab gym with hemi-walker and CGA. Focus shifted to RUE NMR as detailed below. NMES applied to supraspinatus and middle deltoid to help approximate shoulder joint to reduce sublux and reduce pain. No s/s of skin breakdown before or after application. While NMES on shoulder, patient participated in grasp/release task with red foam and cup. Able to grasp each foam block with support at wrist. Assist required to release into cup. Total A for return to room for time management. Wrist cock-up delivered from [place]. Patient education provided on purpose of brace. Will establish a wearing schedule at time of subsequent treatment session. Session concluded with patient seated in wc with call bell within reach, belt alarm activated and  all needs met.  ? ?Ratio 1:3 ?Rate 35 pps ?Waveform- Asymmetric ?Ramp 1.0 ?Pulse 300 ?Intensity- 21 ?Duration -   15 ? ? ? ?Therapy Documentation ?Precautions:  ?Precautions ?Precautions: Fall ?Restrictions ?Weight Bearing Restrictions: No ?General: ?  ? ?Therapy/Group: Individual Therapy ? ?Francys Bolin R [place]-Davis ?05/15/2021, 2:38 PM ?

## 2021-05-15 NOTE — Progress Notes (Signed)
Occupational Therapy Session Note ? ?Patient Details  ?Name: JVON MERONEY ?MRN: [id number] ?Date of Birth: 1955-03-12 ? ?Today's Date: 05/15/2021 ?OT Individual Time: 1735-6701 ?OT Individual Time Calculation (min): 39 min  ? ? ?Short Term Goals: ?Week 1:  OT Short Term Goal 1 (Week 1): Patient will don UB clothing with good recall of hemi technique and Min A with no more than 2 verbal cues. ?OT Short Term Goal 1 - Progress (Week 1): Progressing toward goal ?OT Short Term Goal 2 (Week 1): Patient complete 1/3 parts of toileting task with Mod A and LRAD. ?OT Short Term Goal 2 - Progress (Week 1): Met ?OT Short Term Goal 3 (Week 1): Patient will don LB clothing with hemi technique and Mod A with LRAD. ?OT Short Term Goal 3 - Progress (Week 1): Met ?OT Short Term Goal 4 (Week 1): Patient will complete squat-pivot to [facility] with Min A. ?OT Short Term Goal 4 - Progress (Week 1): Progressing toward goal ?Week 2:  OT Short Term Goal 1 (Week 2): Pt will don LB clothing with min A overall ?OT Short Term Goal 2 (Week 2): Pt will complete transfer to [facility] with min A ?OT Short Term Goal 3 (Week 2): Pt will complete UB dressing with min A, no more than 2 cues for hemi technique ? ?Skilled Therapeutic Interventions/Progress Updates:  ?  Pt received semi-reclined in bed with family present, no c/o pain, agreeable to therapy. Session focus on self-care retraining, activity tolerance, transfer retraining, RUE NMR in prep for improved ADL/IADL/func mobility performance + decreased caregiver burden. Came to sitting EOB light min A to progress RLE off bed. Light min A to power up into standing with use of QC, CGA short ambulatory transfer > w/c. Donned B shoes with min A to initiate threading into R shoe. Total A w/c transport to and from gym for time management/energy conservation.  ? ?Stood at high/low table to complete 2x15 forward towel slides, clockwise/counter clockwise circles and 1x15 modified push-ups and rolling forward cylinder  support. Issued soft tan theraputty and pt participated in putty squeezes, rolling into log shape, and flattening putty against table. Pt frustrated at inability to squeeze putty despite having some active digit flexion this date. ? ? ?Pt left seated in w/c awaiting following OT session, family present all bell in reach, and all immediate needs met.  ? ? ?Therapy Documentation ?Precautions:  ?Precautions ?Precautions: Fall ?Restrictions ?Weight Bearing Restrictions: No ? ?Pain: no c/o throughout ?  ?ADL: See Care Tool for more details. ? ?Therapy/Group: Individual Therapy ? ?Volanda Napoleon MS, OTR/L ? ?05/15/2021, 7:23 AM ?

## 2021-05-16 MED ORDER — LIDOCAINE HCL 1 % IJ SOLN
10.0000 mL | Freq: Once | INTRAMUSCULAR | Status: DC
  Filled 2021-05-16: qty 10

## 2021-05-16 NOTE — Plan of Care (Signed)
?  Problem: Consults ?Goal: RH STROKE PATIENT EDUCATION ?Description: See Patient Education module for education specifics  ?Outcome: Progressing ?  ?Problem: RH BOWEL ELIMINATION ?Goal: RH STG MANAGE BOWEL WITH ASSISTANCE ?Description: STG Manage Bowel with Supervision Assistance. ?Outcome: Progressing ?Goal: RH STG MANAGE BOWEL W/MEDICATION W/ASSISTANCE ?Description: STG Manage Bowel with Medication with Supervision Assistance. ?Outcome: Progressing ?  ?Problem: RH SAFETY ?Goal: RH STG ADHERE TO SAFETY PRECAUTIONS W/ASSISTANCE/DEVICE ?Description: STG Adhere to Safety Precautions With Cues and Reminders. ?Outcome: Progressing ?Goal: RH STG DECREASED RISK OF FALL WITH ASSISTANCE ?Description: STG Decreased Risk of Fall With Supervision Assistance. ?Outcome: Progressing ?  ?Problem: RH COGNITION-NURSING ?Goal: RH STG USES MEMORY AIDS/STRATEGIES W/ASSIST TO PROBLEM SOLVE ?Description: STG Uses Memory Aids/Strategies With Supervision Assistance to Problem Solve. ?Outcome: Progressing ?Goal: RH STG ANTICIPATES NEEDS/CALLS FOR ASSIST W/ASSIST/CUES ?Description: STG Anticipates Needs/Calls for Assist With Cues and Reminders. ?Outcome: Progressing ?  ?Problem: RH KNOWLEDGE DEFICIT ?Goal: RH STG INCREASE KNOWLEDGE OF HYPERTENSION ?Description: Patient will demonstrate knowledge of HTN medications, dietary restrictions, and BP parameters with educational materials and handouts provided by staff independently at discharge. ?Outcome: Progressing ?Goal: RH STG INCREASE KNOWLEDGE OF STROKE PROPHYLAXIS ?Description: Patient will demonstrate knowledge of medications used to prevent future strokes with educational materials and handouts provided by staff independently at discharge. ?Outcome: Progressing ?  ?

## 2021-05-16 NOTE — Progress Notes (Signed)
Physical Therapy Session Note ? ?Patient Details  ?Name: Aaron Dennis ?MRN: [id number] ?Date of Birth: 11/17/1955 ? ?Today's Date: 05/16/2021 ?PT Individual Time: 0800-0900, 0093-8182 ?PT Individual Time Calculation (min): 60 min, 40 min  ? ?Short Term Goals: ?Week 1:  PT Short Term Goal 1 (Week 1): Pt will initate gait training ?PT Short Term Goal 1 - Progress (Week 1): Met ?PT Short Term Goal 2 (Week 1): pt will propel w/c with min A or better ?PT Short Term Goal 2 - Progress (Week 1): Met ?PT Short Term Goal 3 (Week 1): Pt will perform squat pivot with min A or better ?PT Short Term Goal 3 - Progress (Week 1): Progressing toward goal ?Week 2:  PT Short Term Goal 1 (Week 2): Pt will ambulate 58f with min assist consistently ?PT Short Term Goal 1 - Progress (Week 2): Met ?PT Short Term Goal 2 (Week 2): Pt will propell WC >1557fwith supevision assist ?PT Short Term Goal 2 - Progress (Week 2): Met ?PT Short Term Goal 3 (Week 2): Pt will trasnfer to and from bed with min assist consistently ?PT Short Term Goal 3 - Progress (Week 2): Met ?PT Short Term Goal 4 (Week 2): Pt wil ascend 3 steps with mod assist and LRAD ?PT Short Term Goal 4 - Progress (Week 2): Progressing toward goal ? ?Skilled Therapeutic Interventions/Progress Updates:  ?  Session 1: ?Pt seated in w/c on arrival and agreeable to therapy. No complaint of pain. Pt transported to therapy gym for time management and energy conservation. Pt performed Stand pivot transfer with HW to mat table. Pt Sit to stand with no UE support and visual feed back with focus on maintaining midline. MD in/out for consult. Discussed that pt is not able to stay longer but that we anticipate him to meet his goals based on his current progress. Pt ambulated ~90 ft to stairs with hemi walker and min A, cues for step through gait pattern. Pt then navigated 6 " stairs 2 x 4 with LUE support on hand rail and step to pattern, CGA. Verbal cues for technique and assist to place RLE on  step while descending. Pt then returned to room and returned to bed with supervision, was left with all needs in reach and alarm active.  ? ?Session 2: ?Pt seated in w/c on arrival and agreeable to therapy. Pt with no c/o pain at rest, but shoulder pain with some activity, addressed with positioning and supporting the arm.  ?Pt ambulated to/from day room with CGA and HW, with cueing for increased L step length and heel strike/dorsiflexion with mod-max improvement. Mild LOB requiring min A with fatigue.  ?Pt then utilized nustep, 3 x 4 min with 1-2 min rest break on level 4. Discussed pt's equipment needs and continued to reiterate that pt will be ready for d/c on set date. Pt with difficulty standing from nustep, but stood with min A after cueing to push from armrest. Pt ambulated back to room and remained in w/c with family present, was left with all needs in reach and alarm active.  ? ?Therapy Documentation ?Precautions:  ?Precautions ?Precautions: Fall ?Restrictions ?Weight Bearing Restrictions: No ?General: ?  ? ? ? ? ?Therapy/Group: Individual Therapy ? ?OlTarrant4/08/2021, 1:02 PM  ?

## 2021-05-16 NOTE — Progress Notes (Signed)
Occupational Therapy Weekly Progress Note ? ?Patient Details  ?Name: Aaron Dennis ?MRN: [id number] ?Date of Birth: 1955-11-04 ? ?Beginning of progress report period: May 09, 2021 ?End of progress report period: May 16, 2021 ? ?Today's Date: 05/16/2021 ?OT Individual Time: 1003-1100 ?OT Individual Time Calculation (min): 57 min  ? ? ?Patient has met 3 of 3 short term goals. Steady progress toward goals. Patient currently completing UB ADLs with S and LB ADLs with CGA grossly with intermittent cues for hemi technique. Light Min A to don footwear on R. Patient with return in digits demonstrating ~20-25 degrees active flexion and ~10-15 degrees active extension in gravity minimized plane. Patient also able to actively flex elbow ~20 degrees from neutral. Continued use of NMES at shoulder and wrist flexors/extensors to decrease subluxation and improve function of RUE respectively.  ? ?Patient continues to demonstrate the following deficits: muscle paralysis and impaired timing and sequencing, abnormal tone, unbalanced muscle activation, decreased coordination, and decreased motor planning and therefore will continue to benefit from skilled OT intervention to enhance overall performance with BADL and Reduce care partner burden. ? ?Patient progressing toward long term goals..  Continue plan of care. ? ?OT Short Term Goals ?Week 1:  OT Short Term Goal 1 (Week 1): Patient will don UB clothing with good recall of hemi technique and Min A with no more than 2 verbal cues. ?OT Short Term Goal 1 - Progress (Week 1): Progressing toward goal ?OT Short Term Goal 2 (Week 1): Patient complete 1/3 parts of toileting task with Mod A and LRAD. ?OT Short Term Goal 2 - Progress (Week 1): Met ?OT Short Term Goal 3 (Week 1): Patient will don LB clothing with hemi technique and Mod A with LRAD. ?OT Short Term Goal 3 - Progress (Week 1): Met ?OT Short Term Goal 4 (Week 1): Patient will complete squat-pivot to [facility] with Min A. ?OT Short Term Goal  4 - Progress (Week 1): Progressing toward goal ?Week 2:  OT Short Term Goal 1 (Week 2): Pt will don LB clothing with min A overall ?OT Short Term Goal 1 - Progress (Week 2): Met ?OT Short Term Goal 2 (Week 2): Pt will complete transfer to [facility] with min A ?OT Short Term Goal 2 - Progress (Week 2): Met ?OT Short Term Goal 3 (Week 2): Pt will complete UB dressing with min A, no more than 2 cues for hemi technique ?OT Short Term Goal 3 - Progress (Week 2): Met ?Week 3:  OT Short Term Goal 1 (Week 3): STG=LTG 2/2 ELOS ? ?Skilled Therapeutic Interventions/Progress Updates:  ?Patient met lying supine in bed in agreement with OT treatment session. Denies pain at time of entry but reports overnight discomfort in R cervical neck. R wrist cock-up splint removed for skin assessment. No s/s redness or skin breakdown noted. Will continue to educate patient on wearing schedule and importance of skin checks and hand hygiene 2x daily. Able to don footwear in figure-4 position (socks/shoes) seated EOB with Min A only on R. Forward chaining to don sock. Functional mobility to therapy gym with hemi-walker and CGA-Min A. Focus shifted to RUE NMR with use of NMES on R wrist extensors. Ace warp applied to R hand to maintain position of 1lb weighted dowel. No adverse reactions after treatment and is skin intact. Session concluded with patient lying supine in bed with call bell within reach, bed alarm activated and all needs met.  ? ?Ratio 1:3 ?Rate 35 pps ?Waveform- Asymmetric ?Ramp  1.0 ?Pulse 300 ?Intensity- 25 ?Duration -   10 min ? ?Therapy Documentation ?Precautions:  ?Precautions ?Precautions: Fall ?Restrictions ?Weight Bearing Restrictions: No ?General: ?  ? ?Therapy/Group: Individual Therapy ? ?Ugochi Henzler R Howerton-Davis ?05/16/2021, 6:54 AM  ?

## 2021-05-16 NOTE — Progress Notes (Signed)
Patient ID: Aaron Dennis, male   DOB: 01/12/1956, 66 y.o.   MRN: [id number]  Met with pt who has concerns about going home and requiring care. He does not feel he is ready and voiced his wife can not assist and his brother may come but he will be basically alone with wife. He is concerned about getting up the stairs into his home. His son does work and can assist. Discussed equipment needs he will pay for the hemi walker since he does feel he needs a wheelchair and insurance only covers one mobility device-wheelchair or hemi walker. Pt wants to stay longer will message team and MD regarding his concerns.  ?

## 2021-05-16 NOTE — Progress Notes (Signed)
?                                                       PROGRESS NOTE ? ? ?Subjective/Complaints: ? ?Pt reports lidocaine patch not "enough" for neck pain ? ?Got wrist cock up splint.  ? ?We discussed doing trigger point injections for R neck tightness.  ? ? ? ?ROS: ? ?Pt denies SOB, abd pain, CP, N/V/C/D, and vision changes ? ? ?Objective: ?  ?No results found. ?No results for input(s): WBC, HGB, HCT, PLT in the last 72 hours. ? ? ? ?No results for input(s): NA, K, CL, CO2, GLUCOSE, BUN, CREATININE, CALCIUM in the last 72 hours. ? ? ? ?Intake/Output Summary (Last 24 hours) at 05/16/2021 0913 ?Last data filed at 05/16/2021 0749 ?Gross per 24 hour  ?Intake 0 ml  ?Output 500 ml  ?Net -500 ml  ?  ? ?  ? ?Physical Exam: ?Vital Signs ?Blood pressure 109/74, pulse 67, temperature 97.7 ?F (36.5 ?C), resp. rate 18, height '5\' 10"'$  (1.778 m), weight 75.5 kg, SpO2 100 %. ? ? ? ? ? ? ? ? ? ? ? ?General: awake, alert, appropriate,sitting up on mat in gym- family nearby;  NAD ?HENT: conjugate gaze; oropharynx moist ?CV: regular rate; no JVD ?Pulmonary: CTA B/L; no W/R/R- good air movement ?GI: soft, NT, ND, (+)BS ?Psychiatric: upset that is supposed to d/c Tuesday- is refusing.  ?Neurological: Ox3 ? ?Skin: ?   General: Skin is warm and dry.  ? ? MS: R neck/scalenes, levator and upper traps- still tight.  ?Neurological:  ?   Mental Status: He is alert and oriented to person, place, and time.  ?   Comments: Speech is at baseline per pt= per son Arabic, primary language is good ?Intact to light touch x 4 extremities. Motor 5/5 LUE and LLE. RUE 1/5 finger flexors and triceps . RLE 2+/5 prox to distal.  ? ? ?Assessment/Plan: ?1. Functional deficits which require 3+ hours per day of interdisciplinary therapy in a comprehensive inpatient rehab setting. ?Physiatrist is providing close team supervision and 24 hour management of active medical problems listed below. ?Physiatrist and rehab team continue to assess barriers to discharge/monitor  patient progress toward functional and medical goals ? ?Care Tool: ? ?Bathing ? Bathing activity did not occur: Safety/medical concerns ?Body parts bathed by patient: Right arm, Chest, Front perineal area, Abdomen, Right upper leg, Left upper leg, Face, Left arm, Buttocks, Right lower leg, Left lower leg  ? Body parts bathed by helper: Left arm, Buttocks, Left lower leg, Right lower leg ?  ?  ?Bathing assist Assist Level: Contact Guard/Touching assist ?  ?  ?Upper Body Dressing/Undressing ?Upper body dressing Upper body dressing/undressing activity did not occur (including orthotics): Safety/medical concerns ?What is the patient wearing?: Pull over shirt ?   ?Upper body assist Assist Level: Set up assist ?   ?Lower Body Dressing/Undressing ?Lower body dressing ? ? ? Lower body dressing activity did not occur: Safety/medical concerns ?What is the patient wearing?: Pants ? ?  ? ?Lower body assist Assist for lower body dressing: Contact Guard/Touching assist ?   ? ?Toileting ?Toileting Toileting Activity did not occur (Probation officer and hygiene only): N/A (no void or bm)  ?Toileting assist Assist for toileting: Contact Guard/Touching assist ?  ?  ?Transfers ?Chair/bed transfer ? ?  Transfers assist ? Chair/bed transfer activity did not occur: Safety/medical concerns ? ?Chair/bed transfer assist level: Minimal Assistance - Patient > 75% ?  ?  ?Locomotion ?Ambulation ? ? ?Ambulation assist ? ? Ambulation activity did not occur: Safety/medical concerns ? ?Assist level: Minimal Assistance - Patient > 75% ?Assistive device: Walker-hemi ?Max distance: 35  ? ?Walk 10 feet activity ? ? ?Assist ? Walk 10 feet activity did not occur: Safety/medical concerns ? ?Assist level: Minimal Assistance - Patient > 75% ?Assistive device: Walker-hemi  ? ?Walk 50 feet activity ? ? ?Assist Walk 50 feet with 2 turns activity did not occur: Safety/medical concerns ? ?Assist level: Minimal Assistance - Patient > 75% ?Assistive device:  Walker-hemi  ? ? ?Walk 150 feet activity ? ? ?Assist Walk 150 feet activity did not occur: Safety/medical concerns ? ?  ?  ?  ? ?Walk 10 feet on uneven surface  ?activity ? ? ?Assist Walk 10 feet on uneven surfaces activity did not occur: Safety/medical concerns ? ? ?  ?   ? ?Wheelchair ? ? ? ? ?Assist Is the patient using a wheelchair?: Yes ?Type of Wheelchair: Manual ?  ? ?Wheelchair assist level: Minimal Assistance - Patient > 75% ?Max wheelchair distance: 20  ? ? ?Wheelchair 50 feet with 2 turns activity ? ? ? ?Assist ? ?  ?  ? ? ?   ? ?Wheelchair 150 feet activity  ? ? ? ?Assist ?   ? ? ?   ? ?Blood pressure 109/74, pulse 67, temperature 97.7 ?F (36.5 ?C), resp. rate 18, height '5\' 10"'$  (1.778 m), weight 75.5 kg, SpO2 100 %. ? ?Medical Problem List and Plan: ?1. Functional deficits secondary to acute left frontoparietal infarct ?            -patient may  shower ?            -ELOS/Goals: 7-10 days supervision ? D/c 4/11 ? Continue CIR- PT, OT and SLP ? Spent a lot of time explaining that insurance will not pay for him to stay longer and will be discharged on Tuesday-  ?2.  Antithrombotics: ?-DVT/anticoagulation:  Pharmaceutical: Lovenox ?            -antiplatelet therapy: Plavix and aspirin for 3 weeks then aspirin alone ?3. Pain Management: Tylenol ? 3/29- schedule tylenol at 8pm for R shoulder pain- wait on steroid injection of R shoulder for now; also wait on prevention meds for HA's- suggest pt ask for tylenol prn for now ? 3/30- pain in R shoulder better last night- con't regimen ? 4/4- doing estim for R shoulder ?4/5- will add lidoderm patch for R upper traps 8pm to 8am to help with muscle tightness ? 4/6- was put on wrong part- educated pt on how to place patch- took for patch this AM- is due ? 4/7- took off patch- says it's still tight- will do trigger point injections Tuesday.  ?4. Mood: LCSW to evaluate and provide emotional support ? 3/23- pt c/o feeling depressed over stroke- will start Celexa 20 mg  daily. ?3/27- improved mood with celexa- con't regimen   ?            -antipsychotic agents: Not applicable ?5. Neuropsych: This patient is capable of making decisions on his own behalf. ?6. Skin/Wound Care: Routine skin care checks ?    ?7. Fluids/Electrolytes/Nutrition: Routine I's and O's and follow-up chemistries ?8: Cerebral aneurysm: follow-up IR as outpatient ?9: Hyperlipidemia:: continue Crestor 20 mg daily ?10: Essential hypertension: Continue  amlodipine 5 mg daily ?Vitals:  ? 05/15/21 1947 05/16/21 0459  ?BP: 122/71 109/74  ?Pulse: 68 67  ?Resp: 18 18  ?Temp: 97.7 ?F (36.5 ?C) 97.7 ?F (36.5 ?C)  ?SpO2: 100% 100%  ?4/4- BP controlled- con't regimen ?11: Polycythemia: Follow-up medical oncology as outpatient ?12: GI prophylaxis: Continue Protonix ?13: BPH: Continue Flomax ? 3/25- peeing well- con't flomax ?14. Constipation- LBM 3 days ago- will give miralax prn ?Pt requesting stool softener ?4/3- will give sorbitol after therapy today 30cc- has been 3 days ?4/4- Had 2 Bms with sorbitol- feeling better ? 4/5- LBM 2 nights ago- denies constipation- will wait 1 more day and then intervene with sorbitol- but add Miralax daily ot regimen- already on senna 2 tabs BID ? 4/6- refused bowel meds yesterday, however did have BM this AM ? 15/ hypokalemia ? 3/22- will replete 40 mEq x1 and recheck  ?3/27- K+ 3.7- con't regimen off K+. ? 3/31- labs Monday ?4/3- K+ 3.4- will give 40 Meq x1- and will likely need 10 mEq daily? Will d/w team tomorrow ?4/4- will start 10 mEq daily.  ? 4/6- labs Monday  ?16. Insomnia ?3/28- Will try Low dose Trazodone and monitor for urinary retention- 50 mg QHS-   ? 3/29- will change trazodone to 100 mg QHS and give at [place] well.  ?3/31- slept 8+ hours last night ?17. Dispo ?3/22- 1 family member can stay overnight. ?3/29- d/c date set for 4/11-  ? 4/7- pt says he refuses to leave- he doesn't want to stay "longer' he just wants to stay with indefinite d/c date- until he's "100%"-  explained that's not the goal of inpt rehab- unless he wants to pay the cash price of rehab, after 4/11- insurance will not cover for him to stay longer, esp when doing so well. .  ? ? ?I spent a total of  52  minut

## 2021-05-16 NOTE — Progress Notes (Signed)
Occupational Therapy Session Note ? ?Patient Details  ?Name: Aaron Dennis ?MRN: [id number] ?Date of Birth: 03-Apr-1955 ? ?Today's Date: 05/16/2021 ?OT Individual Time: 9758-8325 ?OT Individual Time Calculation (min): 43 min  ? ? ?Short Term Goals: ?Week 1:  OT Short Term Goal 1 (Week 1): Patient will don UB clothing with good recall of hemi technique and Min A with no more than 2 verbal cues. ?OT Short Term Goal 1 - Progress (Week 1): Progressing toward goal ?OT Short Term Goal 2 (Week 1): Patient complete 1/3 parts of toileting task with Mod A and LRAD. ?OT Short Term Goal 2 - Progress (Week 1): Met ?OT Short Term Goal 3 (Week 1): Patient will don LB clothing with hemi technique and Mod A with LRAD. ?OT Short Term Goal 3 - Progress (Week 1): Met ?OT Short Term Goal 4 (Week 1): Patient will complete squat-pivot to [facility] with Min A. ?OT Short Term Goal 4 - Progress (Week 1): Progressing toward goal ?Week 2:  OT Short Term Goal 1 (Week 2): Pt will don LB clothing with min A overall ?OT Short Term Goal 1 - Progress (Week 2): Met ?OT Short Term Goal 2 (Week 2): Pt will complete transfer to [facility] with min A ?OT Short Term Goal 2 - Progress (Week 2): Met ?OT Short Term Goal 3 (Week 2): Pt will complete UB dressing with min A, no more than 2 cues for hemi technique ?OT Short Term Goal 3 - Progress (Week 2): Met ?Week 3:  OT Short Term Goal 1 (Week 3): STG=LTG 2/2 ELOS ? ?Skilled Therapeutic Interventions/Progress Updates:  ?Patient met lying supine in bed in agreement with OT treatment session. Denies pain this afternoon. Supine to EOB with S and increased time/effort 2/2 fatigue. Seated EOB patient able to don slip-on shoes with Min A. Reinforced purpose of rehab and role of OT. Discussion with patient/family about ELOS and progress toward goals. Brother reassures this Probation officer that he will be available to assist at time of d/c. Sit to stand from EOB with CGA. Functional mobility from room to ortho gym with CGA and intermittent cues  sequencing. Patient completed 3 bouts x5 min each on BUE arm ergometer (level 1) with ace wrap at R hand to maintain position. External assist to promote scapular protraction/retraction during task. Session concluded with patient seated in wc with call bell within reach, belt alarm activated and all needs met.  ? ?Therapy Documentation ?Precautions:  ?Precautions ?Precautions: Fall ?Restrictions ?Weight Bearing Restrictions: No ?General: ?  ?Therapy/Group: Individual Therapy ? ?Bobie Kistler R Howerton-Davis ?05/16/2021, 6:56 AM ?

## 2021-05-17 NOTE — Progress Notes (Signed)
?                                                       PROGRESS NOTE ? ? ?Subjective/Complaints: ?Patient is concerned he will not be ready for d/c 4/11 given limited support at home and right upper extremity weakness- I see Dr. Dagoberto Ligas has already discussed with him, encouraged him that he is ready for discharge 4/11.  ? ? ? ?ROS: ? ?Pt denies SOB, abd pain, CP, N/V/C/D, and vision changes, +right upper extremity weakness ? ? ?Objective: ?  ?No results found. ?No results for input(s): WBC, HGB, HCT, PLT in the last 72 hours. ? ? ? ?No results for input(s): NA, K, CL, CO2, GLUCOSE, BUN, CREATININE, CALCIUM in the last 72 hours. ? ? ? ?Intake/Output Summary (Last 24 hours) at 05/17/2021 1341 ?Last data filed at 05/17/2021 1318 ?Gross per 24 hour  ?Intake 240 ml  ?Output 200 ml  ?Net 40 ml  ?  ? ?  ? ?Physical Exam: ?Vital Signs ?Blood pressure 127/67, pulse 72, temperature 97.7 ?F (36.5 ?C), temperature source Oral, resp. rate 18, height '5\' 10"'$  (1.778 m), weight 75.4 kg, SpO2 100 %. ? ?Gen: no distress, normal appearing ?HEENT: oral mucosa pink and moist, NCAT ?Cardio: Reg rate ?Chest: normal effort, normal rate of breathing ?Abd: soft, non-distended ?Ext: no edema ?Psych: pleasant, normal affect ? ?Skin: ?   General: Skin is warm and dry.  ? ? MS: R neck/scalenes, levator and upper traps- still tight.  ?Neurological:  ?   Mental Status: He is alert and oriented to person, place, and time.  ?   Comments: Speech is at baseline per pt= per son Arabic, primary language is good ?Intact to light touch x 4 extremities. Motor 5/5 LUE and LLE. RUE 1/5 finger flexors and triceps . RLE 2+/5 prox to distal.  ? ? ?Assessment/Plan: ?1. Functional deficits which require 3+ hours per day of interdisciplinary therapy in a comprehensive inpatient rehab setting. ?Physiatrist is providing close team supervision and 24 hour management of active medical problems listed below. ?Physiatrist and rehab team continue to assess barriers to  discharge/monitor patient progress toward functional and medical goals ? ?Care Tool: ? ?Bathing ? Bathing activity did not occur: Safety/medical concerns ?Body parts bathed by patient: Right arm, Chest, Front perineal area, Abdomen, Right upper leg, Left upper leg, Face, Left arm, Buttocks, Right lower leg, Left lower leg  ? Body parts bathed by helper: Left arm, Buttocks, Left lower leg, Right lower leg ?  ?  ?Bathing assist Assist Level: Contact Guard/Touching assist ?  ?  ?Upper Body Dressing/Undressing ?Upper body dressing Upper body dressing/undressing activity did not occur (including orthotics): Safety/medical concerns ?What is the patient wearing?: Pull over shirt ?   ?Upper body assist Assist Level: Set up assist ?   ?Lower Body Dressing/Undressing ?Lower body dressing ? ? ? Lower body dressing activity did not occur: Safety/medical concerns ?What is the patient wearing?: Pants ? ?  ? ?Lower body assist Assist for lower body dressing: Contact Guard/Touching assist ?   ? ?Toileting ?Toileting Toileting Activity did not occur (Probation officer and hygiene only): N/A (no void or bm)  ?Toileting assist Assist for toileting: Contact Guard/Touching assist ?  ?  ?Transfers ?Chair/bed transfer ? ?Transfers assist ? Chair/bed transfer activity did not occur:  Safety/medical concerns ? ?Chair/bed transfer assist level: Minimal Assistance - Patient > 75% ?  ?  ?Locomotion ?Ambulation ? ? ?Ambulation assist ? ? Ambulation activity did not occur: Safety/medical concerns ? ?Assist level: Minimal Assistance - Patient > 75% ?Assistive device: Walker-hemi ?Max distance: 35  ? ?Walk 10 feet activity ? ? ?Assist ? Walk 10 feet activity did not occur: Safety/medical concerns ? ?Assist level: Minimal Assistance - Patient > 75% ?Assistive device: Walker-hemi  ? ?Walk 50 feet activity ? ? ?Assist Walk 50 feet with 2 turns activity did not occur: Safety/medical concerns ? ?Assist level: Minimal Assistance - Patient >  75% ?Assistive device: Walker-hemi  ? ? ?Walk 150 feet activity ? ? ?Assist Walk 150 feet activity did not occur: Safety/medical concerns ? ?  ?  ?  ? ?Walk 10 feet on uneven surface  ?activity ? ? ?Assist Walk 10 feet on uneven surfaces activity did not occur: Safety/medical concerns ? ? ?  ?   ? ?Wheelchair ? ? ? ? ?Assist Is the patient using a wheelchair?: Yes ?Type of Wheelchair: Manual ?  ? ?Wheelchair assist level: Minimal Assistance - Patient > 75% ?Max wheelchair distance: 20  ? ? ?Wheelchair 50 feet with 2 turns activity ? ? ? ?Assist ? ?  ?  ? ? ?   ? ?Wheelchair 150 feet activity  ? ? ? ?Assist ?   ? ? ?   ? ?Blood pressure 127/67, pulse 72, temperature 97.7 ?F (36.5 ?C), temperature source Oral, resp. rate 18, height '5\' 10"'$  (1.778 m), weight 75.4 kg, SpO2 100 %. ? ?Medical Problem List and Plan: ?1. Functional deficits secondary to acute left frontoparietal infarct ?            -patient may  shower ?            -ELOS/Goals: 7-10 days supervision ? D/c 4/11 ? Continue CIR- PT, OT and SLP ? Spent a lot of time explaining that insurance will not pay for him to stay longer and will be discharged on Tuesday-  ?2.  Antithrombotics: ?-DVT/anticoagulation:  Pharmaceutical: Lovenox ?            -antiplatelet therapy: Plavix and aspirin for 3 weeks then aspirin alone ?3. Pain Management: Tylenol ? 3/29- schedule tylenol at 8pm for R shoulder pain- wait on steroid injection of R shoulder for now; also wait on prevention meds for HA's- suggest pt ask for tylenol prn for now ? 3/30- pain in R shoulder better last night- con't regimen ? 4/4- doing estim for R shoulder ?4/5- will add lidoderm patch for R upper traps 8pm to 8am to help with muscle tightness ? 4/6- was put on wrong part- educated pt on how to place patch- took for patch this AM- is due ? 4/7- took off patch- says it's still tight- will do trigger point injections Tuesday.  ?4. Mood: LCSW to evaluate and provide emotional support ? 3/23- pt c/o feeling  depressed over stroke- will start Celexa 20 mg daily. ?3/27- improved mood with celexa- con't regimen   ?            -antipsychotic agents: Not applicable ?5. Neuropsych: This patient is capable of making decisions on his own behalf. ?6. Skin/Wound Care: Routine skin care checks ?    ?7. Fluids/Electrolytes/Nutrition: Routine I's and O's and follow-up chemistries ?8: Cerebral aneurysm: follow-up IR as outpatient ?9: Hyperlipidemia:: continue Crestor 20 mg daily ?10: Essential hypertension: continue amlodipine 5 mg daily ?Vitals:  ?  05/17/21 0439 05/17/21 1316  ?BP: 111/71 127/67  ?Pulse: 61 72  ?Resp: 18 18  ?Temp: 97.7 ?F (36.5 ?C) 97.7 ?F (36.5 ?C)  ?SpO2: 100% 100%  ? ?11: Polycythemia: Follow-up medical oncology as outpatient ?12: GI prophylaxis: Continue Protonix ?13: BPH: Continue Flomax ? 3/25- peeing well- con't flomax ?14. Constipation- LBM 3 days ago- will give miralax prn ?Pt requesting stool softener ?4/3- will give sorbitol after therapy today 30cc- has been 3 days ?4/4- Had 2 Bms with sorbitol- feeling better ? 4/5- LBM 2 nights ago- denies constipation- will wait 1 more day and then intervene with sorbitol- but add Miralax daily ot regimen- already on senna 2 tabs BID ? 4/6- refused bowel meds yesterday, however did have BM this AM ? 15/ hypokalemia ? 3/22- will replete 40 mEq x1 and recheck  ?3/27- K+ 3.7- con't regimen off K+. ? 3/31- labs Monday ?4/3- K+ 3.4- will give 40 Meq x1- and will likely need 10 mEq daily? Will d/w team tomorrow ?4/4- will start 10 mEq daily.  ? 4/6- labs Monday  ?16. Insomnia ?3/28- Will try Low dose Trazodone and monitor for urinary retention- 50 mg QHS-   ? 3/29- will change trazodone to 100 mg QHS and give at [place] well.  ?3/31- slept 8+ hours last night ?17. Dispo ?3/22- 1 family member can stay overnight. ?3/29- d/c date set for 4/11-  ? 4/7- pt says he refuses to leave- he doesn't want to stay "longer' he just wants to stay with indefinite d/c date- until he's  "100%"- explained that's not the goal of inpt rehab- unless he wants to pay the cash price of rehab, after 4/11- insurance will not cover for him to stay longer, esp when doing so well. .  ? ? ? ? ?LOS: ?18 days ?A FAC

## 2021-05-17 NOTE — Progress Notes (Signed)
Physical Therapy Session Note ? ?Patient Details  ?Name: Aaron Dennis ?MRN: [id number] ?Date of Birth: 1956/01/13 ? ?Today's Date: 05/17/2021 ?PT Individual Time: 8185-6314 ?PT Individual Time Calculation (min): 40 min  ? ?Short Term Goals: ? ?Week 3:  PT Short Term Goal 1 (Week 3): =LTGs d/t ELOS ? ?Skilled Therapeutic Interventions/Progress Updates:  ? ?Pt received supine in bed and agreeable to PT. Supine>sit transfer with min assist and cues for improved activation of deep core to facilitate forward rotation of the R trunk then push to sit EOB. Donning shoes EOB with set up assist. Stand pivot transfer to [facility] with CGA and no AD, cues for improved UE placement on the LUE ? ?Gait training with HW and givmohr x 187f; CGA-supervision assist. Forward/reverse 2 x 131fand side stepping R and L  x 1233f Cues for step width and length on the RLE intermittently as well as cues for increased gluteal activation with side stepping and reverse gait to limit trunkal compensations. Sit<>stand performed with CGA-supervision assist throughout session with cues for anterior weight shift with fatigue.  ? ?Patient returned to room and [facility] and all needs met.   ? ? ? ? ?   ? ?Therapy Documentation ?Precautions:  ?Precautions ?Precautions: Fall ?Restrictions ?Weight Bearing Restrictions: No ? ? ?Pain: ?  denies ? ? ? ?Therapy/Group: Individual Therapy ? ?AusLorie Phenix/09/2021, 11:32 AM  ?

## 2021-05-18 NOTE — Plan of Care (Signed)
?  Problem: Consults ?Goal: RH STROKE PATIENT EDUCATION ?Description: See Patient Education module for education specifics  ?Outcome: Progressing ?  ?Problem: RH SAFETY ?Goal: RH STG ADHERE TO SAFETY PRECAUTIONS W/ASSISTANCE/DEVICE ?Description: STG Adhere to Safety Precautions With Cues and Reminders. ?Outcome: Progressing - coming to terms with the need to utilize assistive device. ?  ?Problem: RH SAFETY ?Goal: RH STG DECREASED RISK OF FALL WITH ASSISTANCE ?Description: STG Decreased Risk of Fall With Supervision Assistance. ?Outcome: Progressing - understanding the need to call for assistance ?Problem: RH COGNITION-NURSING ?Goal: RH STG ANTICIPATES NEEDS/CALLS FOR ASSIST W/ASSIST/CUES ?Description: STG Anticipates Needs/Calls for Assist With Cues and Reminders. ?Outcome: Progressing ?  ?  ?

## 2021-05-18 NOTE — Progress Notes (Signed)
Physical Therapy Session Note ? ?Patient Details  ?Name: Aaron Dennis ?MRN: [id number] ?Date of Birth: 1955-09-02 ? ?Today's Date: 05/18/2021 ?PT Individual Time: 4782-9562 ?PT Individual Time Calculation (min): 30 min  ? ?Short Term Goals: ?Week 3:  PT Short Term Goal 1 (Week 3): =LTGs d/t ELOS ? ?Skilled Therapeutic Interventions/Progress Updates:  ?  Pt received seated in w/c in room, agreeable to PT session. No complaints of pain this date. Pt frustrated with lack of return of control of his RUE, provided emotional support and education regarding recovering from stroke. Also encouraged pt to continue working with home health therapies with progression to outpatient therapies to continue to work towards regaining function. Despite education pt remains frustrated and questions being d/c home when he has not experienced satisfactory recovery in his RUE. Again educated pt on stroke recovery and that goal of inpatient rehab setting is to help a patient progress to a level where they are safe at home and to continue the recovery process with further therapies. Pt agreeable to stair training this PM to prepare for a safe d/c home. Per pt report he only has L handrail available. Pt able to ascend/descend 4 x 6" stairs with min A with use of LUE on L handrail ascending, R handrail descending. Pt does exhibit significant R lateral lean upon descent with poor awareness of his balance deficits. Demonstrated several methods of stair navigation with just use of L handrail including descending laterally with L handrail and/or descending forwards with LUE support from a caregiver. Pt resistant to attempting stairs laterally due to RLE weakness and when attempting to descend stairs with LUE support from caregiver he becomes unbalanced and reaches for R handrail to continue his descent on stairs. Pt very frustrated with practice of stairs this date and resistant to further discussion about his home setup and methods to trial. Further  stair training deferred due to pt agitation. Pt agreeable to ambulation. Ambulation x 150 ft with use of L hemi-walker and CGA to min A for balance, step-to gait pattern. Pt has one LOB to the L when distracted that he is able to correct with min A. Upon returning to his room pt requests to return to bed. Pt ambulates up to bed and while facing bed he turns himself all the way around in standing position without use of AD and comes to sitting position on EOB. Pt then attempts to stand to [facility] to adjust his position but is visibly frustrated with therapist despite not following cues for safety. Pt dismisses therapist from assisting him. Pt left seated EOB in care of family. ? ?Therapy Documentation ?Precautions:  ?Precautions ?Precautions: Fall ?Restrictions ?Weight Bearing Restrictions: No ? ? ? ? ? ? ?Therapy/Group: Individual Therapy ? ? ?[name], PT, DPT, CSRS ?05/18/2021, 3:51 PM  ?

## 2021-05-19 ENCOUNTER — Other Ambulatory Visit (HOSPITAL_COMMUNITY): Payer: Self-pay

## 2021-05-19 LAB — BASIC METABOLIC PANEL
Anion gap: 7 (ref 5–15)
BUN: 11 mg/dL (ref 8–23)
CO2: 27 mmol/L (ref 22–32)
Calcium: 9.3 mg/dL (ref 8.9–10.3)
Chloride: 105 mmol/L (ref 98–111)
Creatinine, Ser: 0.98 mg/dL (ref 0.61–1.24)
GFR, Estimated: >60 mL/min (ref >60)
Glucose, Bld: 92 mg/dL (ref 70–99)
Potassium: 3.8 mmol/L (ref 3.5–5.1)
Sodium: 139 mmol/L (ref 135–145)

## 2021-05-19 LAB — CBC
HCT: 49.8 % (ref 39.0–52.0)
Hemoglobin: 16 g/dL (ref 13.0–17.0)
MCH: 27.1 pg (ref 26.0–34.0)
MCHC: 32.1 g/dL (ref 30.0–36.0)
MCV: 84.4 fL (ref 80.0–100.0)
Platelets: 288 10*3/uL (ref 150–400)
RBC: 5.9 MIL/uL — ABNORMAL HIGH (ref 4.22–5.81)
RDW: 13.5 % (ref 11.5–15.5)
WBC: 6.8 10*3/uL (ref 4.0–10.5)
nRBC: 0 % (ref 0.0–0.2)

## 2021-05-19 MED ORDER — ROSUVASTATIN CALCIUM 20 MG PO TABS
20.0000 mg | ORAL_TABLET | Freq: Every day | ORAL | 0 refills | Status: DC
  Filled 2021-05-19: qty 30, 30d supply, fill #0

## 2021-05-19 MED ORDER — CITALOPRAM HYDROBROMIDE 20 MG PO TABS
20.0000 mg | ORAL_TABLET | Freq: Every day | ORAL | 0 refills | Status: DC
  Filled 2021-05-19: qty 30, 30d supply, fill #0

## 2021-05-19 MED ORDER — TRAZODONE HCL 100 MG PO TABS
100.0000 mg | ORAL_TABLET | Freq: Every day | ORAL | 0 refills | Status: DC
  Filled 2021-05-19: qty 30, 30d supply, fill #0

## 2021-05-19 MED ORDER — ASPIRIN 81 MG PO TBEC
81.0000 mg | DELAYED_RELEASE_TABLET | Freq: Every day | ORAL | 0 refills | Status: DC
  Filled 2021-05-19: qty 30, 30d supply, fill #0

## 2021-05-19 MED ORDER — ACETAMINOPHEN 325 MG PO TABS
325.0000 mg | ORAL_TABLET | ORAL | Status: AC | PRN
Start: 2021-05-19 — End: ?

## 2021-05-19 MED ORDER — TAMSULOSIN HCL 0.4 MG PO CAPS
0.4000 mg | ORAL_CAPSULE | Freq: Every day | ORAL | 0 refills | Status: DC
  Filled 2021-05-19: qty 30, 30d supply, fill #0

## 2021-05-19 MED ORDER — AMLODIPINE BESYLATE 5 MG PO TABS
5.0000 mg | ORAL_TABLET | Freq: Every day | ORAL | 0 refills | Status: DC
  Filled 2021-05-19: qty 30, 30d supply, fill #0

## 2021-05-19 MED ORDER — LIDOCAINE 5 % EX PTCH: 1.0000 | MEDICATED_PATCH | CUTANEOUS | 0 refills | Status: DC

## 2021-05-19 MED ORDER — OMEPRAZOLE 40 MG PO CPDR
40.0000 mg | DELAYED_RELEASE_CAPSULE | Freq: Every day | ORAL | 0 refills | Status: DC
  Filled 2021-05-19: qty 30, 30d supply, fill #0

## 2021-05-19 NOTE — Progress Notes (Addendum)
Occupational Therapy Discharge Summary ? ?Patient Details  ?Name: Aaron Dennis ?MRN: [id number] ?Date of Birth: 1956/01/30 ? ?Today's Date: 05/19/2021 ?OT Individual Time: 4132-4401 ?OT Individual Time Calculation (min): 60 min  ? ? ?Patient has met 8 of 11 long term goals due to improved balance, postural control, ability to compensate for deficits, functional use of  RIGHT upper extremity, improved attention, improved awareness, and improved coordination.  At time of initial eval patient was requiring Mod A for functional transfers and for LB ADLs. Patient was also demonstrating dense RUE hemiplegia. Patient to discharge at overall  Min gurad  level with ADLs and ADL transfers. Able to utilize RUE as a stabilizer and demonstrates minimal return at R shoulder, elbow, wrist and digits. Patient to d/c home with spouse and son (works during the day). Patient's brother is also available to assist with ADLs/IADLs and transportation.  ? ?Reasons goals not met: Patient continues to require CGA for sit to stand transfers, dynamic standing balance and functional mobility therefore these goals have not been met.  ? ?Recommendation:  ?Patient will benefit from ongoing skilled OT services in home health setting to continue to advance functional skills in the area of BADL and Reduce care partner burden. ? ?Equipment: ?BSC; patient to purchase tub bench and givmohr sling on his own ? ?Reasons for discharge: treatment goals met and discharge from hospital ? ?Patient/family agrees with progress made and goals achieved: Yes ? ?OT Discharge ?Precautions/Restrictions  ?Precautions ?Precautions: Fall ?Precaution Comments: R-hemi ?Restrictions ?Weight Bearing Restrictions: No ?General ?  ?Vital Signs ?  ?Pain ?Pain Assessment ?Pain Scale: 0-10 ?Pain Score: 0-No pain ?Pain Location: Shoulder ?Pain Orientation: Right ?Pain Intervention(s): Medication (See eMAR) ?ADL ?ADL ?Eating: Independent ?Grooming: Modified independent ?Where  Assessed-Grooming: Sitting at sink ?Upper Body Bathing: Supervision/safety ?Where Assessed-Upper Body Bathing: Shower ?Lower Body Bathing: Supervision/safety ?Where Assessed-Lower Body Bathing: Shower ?Upper Body Dressing: Setup ?Where Assessed-Upper Body Dressing: Edge of bed ?Lower Body Dressing: Contact guard ?Where Assessed-Lower Body Dressing: Edge of bed ?Toileting: Contact guard ?Where Assessed-Toileting: Toilet ?Toilet Transfer: Contact guard ?Toilet Transfer Method: Ambulating ?Toilet Transfer Equipment: Bedside commode ?Tub/Shower Transfer: Close supervison ?Tub/Shower Equipment: Shower seat with back ?Vision ?Baseline Vision/History: 1 Wears glasses ?Patient Visual Report: No change from baseline ?Vision Assessment?: No apparent visual deficits ?Eye Alignment: Within Functional Limits ?Ocular Range of Motion: Within Functional Limits ?Alignment/Gaze Preference: Head tilt ?Tracking/Visual Pursuits: Decreased smoothness of eye movement to RIGHT inferior field;Decreased smoothness of eye movement to RIGHT superior field ?Saccades: Within functional limits ?Convergence: Within functional limits ?Visual Fields: No apparent deficits ?Perception  ?Perception: Within Functional Limits ?Inattention/Neglect: Other (comment) (Decreased attetion to R visual field and attention to R side of body at time of initial eval. Much improved at time of d/c.) ?Praxis ?Praxis: Impaired ?Praxis Impairment Details: Motor planning ?Praxis-Other Comments: Improved since inital eval. ?Cognition ?Cognition ?Overall Cognitive Status: Within Functional Limits for tasks assessed ?Arousal/Alertness: Awake/alert ?Orientation Level: Person;Place;Situation ?Person: Oriented ?Place: Oriented ?Situation: Oriented ?Memory: Impaired ?Memory Impairment: Decreased short term memory ?Decreased Short Term Memory: Verbal complex ?Attention: Focused;Sustained ?Focused Attention: Appears intact ?Sustained Attention: Appears intact ?Awareness: Appears  intact ?Problem Solving: Appears intact ?Executive Function: Reasoning ?Reasoning: Appears intact ?Safety/Judgment: Appears intact ?Brief Interview for Mental Status (BIMS) ?Repetition of Three Words (First Attempt): 3 ?Temporal Orientation: Year: Correct ?Temporal Orientation: Month: Accurate within 5 days ?Temporal Orientation: Day: Correct ?Recall: "Sock": No, could not recall ?Recall: "Blue": Yes, no cue required ?Recall: "Bed": No, could not recall ?BIMS Summary Score:  11 ?Sensation ?Sensation ?Light Touch: Appears Intact (Intact in BUE) ?Peripheral sensation comments: Pt reports "I can't feel" but was able to feel when testing ?Hot/Cold: Appears Intact ?Proprioception: Impaired by gross assessment (Impaired in LUE) ?Stereognosis: Impaired by gross assessment ?Additional Comments: Impaired in RUE ?Coordination ?Gross Motor Movements are Fluid and Coordinated: No ?Fine Motor Movements are Fluid and Coordinated: No ?Coordination and Movement Description: R hemiplegia, improved from baseline ?Finger Nose Finger Test: Unable to assess on R 2/2 R hemiplegia ?Motor  ?Motor ?Motor: Hemiplegia;Abnormal postural alignment and control;Abnormal tone ?Motor - Skilled Clinical Observations: R hemiplegia ?Motor - Discharge Observations: R hemi plegia, L lateral trunk lean ?Mobility  ?Bed Mobility ?Bed Mobility: Rolling Left;Left Sidelying to Sit ?Rolling Left: Supervision/Verbal cueing ?Left Sidelying to Sit: Supervision/Verbal cueing ?Transfers ?Sit to Stand: Contact Guard/Touching assist ?Stand to Sit: Contact Guard/Touching assist  ?Trunk/Postural Assessment  ?Cervical Assessment ?Cervical Assessment: Within Functional Limits ?Thoracic Assessment ?Thoracic Assessment: Within Functional Limits ?Lumbar Assessment ?Lumbar Assessment: Within Functional Limits ?Postural Control ?Postural Control: Deficits on evaluation  ?Balance ?Balance ?Balance Assessed: Yes ?Static Sitting Balance ?Static Sitting - Balance Support: Left  upper extremity supported;Feet supported ?Static Sitting - Level of Assistance: 6: Modified independent (Device/Increase time) ?Dynamic Sitting Balance ?Dynamic Sitting - Balance Support: Left upper extremity supported;Feet supported ?Dynamic Sitting - Level of Assistance: 5: Stand by assistance ?Static Standing Balance ?Static Standing - Balance Support: Left upper extremity supported ?Static Standing - Level of Assistance: 5: Stand by assistance ?Dynamic Standing Balance ?Dynamic Standing - Balance Support: Left upper extremity supported ?Dynamic Standing - Level of Assistance: 4: Min assist ?Extremity/Trunk Assessment ?RUE Assessment ?RUE Assessment: Exceptions to [facility] ?General Strength Comments: 2-/5 at shoulder and elbow;2-/5 wrist flexors; 1/5 wrist extensors, 2-/5 digits ?RUE Body System: Neuro ?Brunstrum levels for arm and hand: Arm;Hand ?Brunstrum level for arm: Stage III Synergy is performed voluntarily ?Brunstrum level for hand: Stage II Synergy is developing ?LUE Assessment ?LUE Assessment: Within Functional Limits ? ? ?Vivaan Helseth R Howerton-Davis ?05/19/2021, 11:54 AM ?

## 2021-05-19 NOTE — Progress Notes (Signed)
Physical Therapy Session Note ? ?Patient Details  ?Name: Aaron Dennis ?MRN: [id number] ?Date of Birth: 1955-03-01 ? ?Today's Date: 05/19/2021 ?PT Individual Time: 1660-6004 ?PT Individual Time Calculation (min): 64 min  ? ?Short Term Goals: ?Week 1:  PT Short Term Goal 1 (Week 1): Pt will initate gait training ?PT Short Term Goal 1 - Progress (Week 1): Met ?PT Short Term Goal 2 (Week 1): pt will propel w/c with min A or better ?PT Short Term Goal 2 - Progress (Week 1): Met ?PT Short Term Goal 3 (Week 1): Pt will perform squat pivot with min A or better ?PT Short Term Goal 3 - Progress (Week 1): Progressing toward goal ?Week 2:  PT Short Term Goal 1 (Week 2): Pt will ambulate 58f with min assist consistently ?PT Short Term Goal 1 - Progress (Week 2): Met ?PT Short Term Goal 2 (Week 2): Pt will propell WC >154fwith supevision assist ?PT Short Term Goal 2 - Progress (Week 2): Met ?PT Short Term Goal 3 (Week 2): Pt will trasnfer to and from bed with min assist consistently ?PT Short Term Goal 3 - Progress (Week 2): Met ?PT Short Term Goal 4 (Week 2): Pt wil ascend 3 steps with mod assist and LRAD ?PT Short Term Goal 4 - Progress (Week 2): Progressing toward goal ? ?Skilled Therapeutic Interventions/Progress Updates:  ?  Pt seated in w/c on arrival and agreeable to therapy. No complaint of pain. Pt ambulated throughout session, >100 ft throughout  with hemi walker, CGA overall and seated rest breaks. Pt with occ LOB while distracted with min A to recover. Pt vehemently declined practicing stairs today, stating it did not go well previous day. Pt performed car transfer with supervision. MMT ans sensory testing performed in sitting as documented below. Discussed d/c plans and AD usage. Pt navigated ramp with CGA before ambulating back to room and returning to w/c, handed off care to NT as pt requested to get in bed but NT was finishing making bed.  ? ?Therapy Documentation ?Precautions:  ?Precautions ?Precautions:  Fall ?Precaution Comments: R-hemi ?Restrictions ?Weight Bearing Restrictions: No ?General: ?  ?Vital Signs: ?  ?Pain: ?Pain Assessment ?Pain Scale: 0-10 ?Pain Score: 0-No pain ?Pain Location: Shoulder ?Pain Orientation: Right ?Pain Intervention(s): Medication (See eMAR) ?Mobility: ?Bed Mobility ?Bed Mobility: Rolling Left;Left Sidelying to Sit ?Rolling Left: Supervision/Verbal cueing ?Left Sidelying to Sit: Supervision/Verbal cueing ?Transfers ?Transfers: Sit to Stand;Stand to Sit ?Sit to Stand: Contact Guard/Touching assist ?Stand to Sit: Contact Guard/Touching assist ?Transfer (Assistive device): Hemi-walker ?Locomotion : ?Gait ?Ambulation: Yes ?Gait Assistance: Contact Guard/Touching assist ?Gait Distance (Feet): 150 Feet ?Assistive device: Hemi-walker ?Gait Assistance Details: Verbal cues for safe use of DME/AE;Verbal cues for precautions/safety;Verbal cues for gait pattern;Verbal cues for technique ?Gait ?Gait: Yes ?Gait Pattern: Impaired ?Gait Pattern: Decreased step length - left;Decreased stance time - right;Step-through pattern;Step-to pattern;Right flexed knee in stance;Lateral trunk lean to left;Decreased dorsiflexion - right;Decreased hip/knee flexion - right ?Stairs / Additional Locomotion ?Stairs: Yes ?Stairs Assistance: Minimal Assistance - Patient > 75% ?Stair Management Technique: One rail Left ?Number of Stairs: 4 ?Height of Stairs: 6 ?Ramp: Minimal Assistance - Patient >75% ?Curb: Minimal Assistance - Patient >75% ?Wheelchair Mobility ?Wheelchair Mobility: No  ?Trunk/Postural Assessment : ?Cervical Assessment ?Cervical Assessment: Within Functional Limits ?Thoracic Assessment ?Thoracic Assessment: Within Functional Limits ?Lumbar Assessment ?Lumbar Assessment: Within Functional Limits ?Postural Control ?Postural Control: Deficits on evaluation  ?Balance: ?Balance ?Balance Assessed: Yes ?Static Sitting Balance ?Static Sitting - Balance Support: Left upper  extremity supported;Feet supported ?Static  Sitting - Level of Assistance: 6: Modified independent (Device/Increase time) ?Dynamic Sitting Balance ?Dynamic Sitting - Balance Support: Left upper extremity supported;Feet supported ?Dynamic Sitting - Level of Assistance: 5: Stand by assistance ?Static Standing Balance ?Static Standing - Balance Support: Left upper extremity supported ?Static Standing - Level of Assistance: 5: Stand by assistance ?Dynamic Standing Balance ?Dynamic Standing - Balance Support: Left upper extremity supported ?Dynamic Standing - Level of Assistance: 4: Min assist ?Exercises: ?  ?Other Treatments:   ? ? ? ?Therapy/Group: Individual Therapy ? ?[place] ?05/19/2021, 12:10 PM  ?

## 2021-05-19 NOTE — Progress Notes (Signed)
Physical Therapy Discharge Summary ? ?Patient Details  ?Name: Aaron Dennis ?MRN: [id number] ?Date of Birth: 29-Sep-1955 ? ?Today's Date: 05/19/2021 ?PT Individual Time: 1216-2446 ?PT Individual Time Calculation (min): 64 min  ? ? ?Patient has met 6 of 7 long term goals due to improved activity tolerance, improved balance, improved postural control, increased strength, ability to compensate for deficits, improved awareness, and improved coordination.  Patient to discharge at an ambulatory level [place].   Patient's care partner is independent to provide the necessary physical assistance at discharge. Pt to d/c home with family present. Pt's brother present throughout most sessions to assist with interpreting PRN, no formal education performed as pt is competent to direct care. Pt ambulates with CGA, with occ LOB requiring min A, up to 150 ft.. Stairs with min A per home environment.  ? ?Reasons goals not met: Pt still requires CGA for bed/chair transfers. ? ?Recommendation:  ?Patient will benefit from ongoing skilled PT services in home health setting to continue to advance safe functional mobility, address ongoing impairments in gait mechanics, functional use of LUE, stair navigation, , and minimize fall risk. ? ?Equipment: ?Hemi walker ? ?Reasons for discharge: treatment goals met and discharge from hospital ? ?Patient/family agrees with progress made and goals achieved: Yes ? ?PT Discharge ?Precautions/Restrictions ?Precautions ?Precautions: Fall ?Precaution Comments: R-hemi ?Restrictions ?Weight Bearing Restrictions: No ? ?Pain Interference ?Pain Interference ?Pain Effect on Sleep: 1. Rarely or not at all ?Pain Interference with Therapy Activities: 1. Rarely or not at all ?Pain Interference with Day-to-Day Activities: 1. Rarely or not at all ?Vision/Perception  ?Vision - History ?Ability to See in Adequate Light: 0 Adequate ?Vision - Assessment ?Eye Alignment: Within Functional Limits ?Ocular Range of Motion:  Within Functional Limits ?Alignment/Gaze Preference: Head tilt ?Tracking/Visual Pursuits: Decreased smoothness of eye movement to RIGHT inferior field;Decreased smoothness of eye movement to RIGHT superior field ?Saccades: Within functional limits ?Convergence: Within functional limits ?Perception ?Perception: Within Functional Limits ?Inattention/Neglect: Other (comment) (Decreased attetion to R visual field and attention to R side of body at time of initial eval. Much improved at time of d/c.) ?Praxis ?Praxis: Impaired ?Praxis Impairment Details: Motor planning ?Praxis-Other Comments: Improved since inital eval.  ?Cognition ?Overall Cognitive Status: Within Functional Limits for tasks assessed ?Arousal/Alertness: Awake/alert ?Orientation Level: Oriented X4 ?Year: 2023 ?Month: April ?Day of Week: Correct ?Attention: Focused;Sustained ?Focused Attention: Appears intact ?Sustained Attention: Appears intact ?Memory: Impaired ?Memory Impairment: Decreased short term memory ?Decreased Short Term Memory: Verbal complex ?Problem Solving: Appears intact ?Executive Function: Reasoning ?Reasoning: Appears intact ?Safety/Judgment: Appears intact ?Sensation ?Sensation ?Light Touch: Appears Intact ?Peripheral sensation comments: Pt reports "I can't feel" but was able to feel when testing ?Hot/Cold: Appears Intact ?Proprioception: Not tested ?Stereognosis: Not tested ?Coordination ?Gross Motor Movements are Fluid and Coordinated: No ?Fine Motor Movements are Fluid and Coordinated: No ?Coordination and Movement Description: R hemiplegia, improved from baseline ?Motor  ?Motor ?Motor: Hemiplegia;Abnormal postural alignment and control;Abnormal tone ?Motor - Skilled Clinical Observations: R hemiplegia ?Motor - Discharge Observations: R hemi plegia, L lateral trunk lean  ?Mobility ?Bed Mobility ?Bed Mobility: Rolling Left;Left Sidelying to Sit ?Rolling Left: Supervision/Verbal cueing ?Left Sidelying to Sit: Supervision/Verbal  cueing ?Transfers ?Transfers: Sit to Stand;Stand to Sit ?Sit to Stand: Contact Guard/Touching assist ?Stand to Sit: Contact Guard/Touching assist ?Transfer (Assistive device): Hemi-walker ?Locomotion  ?Gait ?Ambulation: Yes ?Gait Assistance: Contact Guard/Touching assist ?Gait Distance (Feet): 150 Feet ?Assistive device: Hemi-walker ?Gait Assistance Details: Verbal cues for safe use of DME/AE;Verbal cues for precautions/safety;Verbal cues  for gait pattern;Verbal cues for technique ?Gait ?Gait: Yes ?Gait Pattern: Impaired ?Gait Pattern: Decreased step length - left;Decreased stance time - right;Step-through pattern;Step-to pattern;Right flexed knee in stance;Lateral trunk lean to left;Decreased dorsiflexion - right;Decreased hip/knee flexion - right ?Stairs / Additional Locomotion ?Stairs: Yes ?Stairs Assistance: Minimal Assistance - Patient > 75% ?Stair Management Technique: One rail Left ?Number of Stairs: 4 ?Height of Stairs: 6 ?Ramp: Minimal Assistance - Patient >75% ?Curb: Minimal Assistance - Patient >75% ?Wheelchair Mobility ?Wheelchair Mobility: No  ?Trunk/Postural Assessment  ?Cervical Assessment ?Cervical Assessment: Within Functional Limits ?Thoracic Assessment ?Thoracic Assessment: Within Functional Limits ?Lumbar Assessment ?Lumbar Assessment: Within Functional Limits ?Postural Control ?Postural Control: Deficits on evaluation  ?Balance ?Balance ?Balance Assessed: Yes ?Static Sitting Balance ?Static Sitting - Balance Support: Left upper extremity supported;Feet supported ?Static Sitting - Level of Assistance: 6: Modified independent (Device/Increase time) ?Dynamic Sitting Balance ?Dynamic Sitting - Balance Support: Left upper extremity supported;Feet supported ?Dynamic Sitting - Level of Assistance: 5: Stand by assistance ?Static Standing Balance ?Static Standing - Balance Support: Left upper extremity supported ?Static Standing - Level of Assistance: 5: Stand by assistance ?Dynamic Standing  Balance ?Dynamic Standing - Balance Support: Left upper extremity supported ?Dynamic Standing - Level of Assistance: 4: Min assist ?Extremity Assessment  ?  ?  ?RLE Assessment ?RLE Assessment: Exceptions to [facility] (Pt frustrated and demoed less strength this AM than previous sessions, previously has demoed 3+ strength) ?RLE Strength ?Right Hip Flexion: 3/5 ?Right Knee Flexion: 3/5 ?Right Knee Extension: 3/5 ?Right Ankle Dorsiflexion: 3/5 ?Right Ankle Plantar Flexion: 3+/5 ?LLE Assessment ?LLE Assessment: Within Functional Limits ?General Strength Comments: Grossly 4+/5 ? ? ? ?[place] ?05/19/2021, 11:42 AM ?

## 2021-05-19 NOTE — Discharge Instructions (Addendum)
Inpatient Rehab Discharge Instructions ? ?Aaron Dennis ?Discharge date and time: 05/20/2021 ? ?Activities/Precautions/ Functional Status: ?Activity: no lifting, driving, or strenuous exercise for until cleared by MD ?Diet: regular diet ?Wound Care: keep wound clean and dry ?Functional status:  ?___ No restrictions     ___ Walk up steps independently ?__x_ 24/7 supervision/assistance   ___ Walk up steps with assistance ?_ Intermittent supervision/assistance  ___ Bathe/dress independently ?___ Walk with walker     ___ Bathe/dress with assistance ?___ Walk Independently    ___ Shower independently ?___ Walk with assistance    __x_ Shower with assistance ?__x_ No alcohol     ___ Return to work/school ________ ? ?Special Instructions: ? ?No driving, alcohol consumption or tobacco use.  ? ?STROKE/TIA DISCHARGE INSTRUCTIONS ?SMOKING Cigarette smoking nearly doubles your risk of having a stroke & is the single most alterable risk factor  ?If you smoke or have smoked in the last 12 months, you are advised to quit smoking for your health. Most of the excess cardiovascular risk related to smoking disappears within a year of stopping. ?Ask you doctor about anti-smoking medications ?[place] Quit Line: 1-800-QUIT NOW ?Free Smoking Cessation Classes [phone]  ?CHOLESTEROL Know your levels; limit fat & cholesterol in your diet  ?Lipid Panel  ?   ?Component Value Date/Time  ? CHOL 159 04/27/2021 1649  ? CHOL 198 06/26/2020 0944  ? TRIG 52 04/27/2021 1649  ? HDL 29 (L) 04/27/2021 1649  ? HDL 38 (L) 06/26/2020 0944  ? CHOLHDL 5.5 04/27/2021 1649  ? VLDL 10 04/27/2021 1649  ? [place] 120 (H) 04/27/2021 1649  ? [place] 147 (H) 06/26/2020 0944  ? ? ? Many patients benefit from treatment even if their cholesterol is at goal. ?Goal: Total Cholesterol (CHOL) less than 160 ?Goal:  Triglycerides (TRIG) less than 150 ?Goal:  HDL greater than 40 ?Goal:  LDL (LDLCALC) less than 100 ?  ?BLOOD PRESSURE American Stroke Association blood pressure  target is less that 120/80 mm/Hg  ?Your discharge blood pressure is:  BP: 123/74 Monitor your blood pressure ?Limit your salt and alcohol intake ?Many individuals will require more than one medication for high blood pressure  ?DIABETES (A1c is a blood sugar average for last 3 months) Goal HGBA1c is under 7% (HBGA1c is blood sugar average for last 3 months)  ?Diabetes: ?No known diagnosis of diabetes   ? ?Lab Results  ?Component Value Date  ? HGBA1C 4.9 04/27/2021  ? ? Your HGBA1c can be lowered with medications, healthy diet, and exercise. ?Check your blood sugar as directed by your physician ?Call your physician if you experience unexplained or low blood sugars.  ?PHYSICAL ACTIVITY/REHABILITATION Goal is 30 minutes at least 4 days per week  ?Activity: Increase activity slowly, ?Therapies: Physical Therapy: Home Health and Occupational Therapy: Home Health ?Return to work: when cleared by MD Activity decreases your risk of heart attack and stroke and makes your heart stronger.  It helps control your weight and blood pressure; helps you relax and can improve your mood. ?Participate in a regular exercise program. ?Talk with your doctor about the best form of exercise for you (dancing, walking, swimming, cycling).  ?DIET/WEIGHT Goal is to maintain a healthy weight  ?Your discharge diet is:  ?Diet Order   ? ?       ?  Diet Heart Room service appropriate? Yes; Fluid consistency: Thin  Diet effective now       ?  ? ?  ?  ? ?  ?  thin liquids ?Your height is:  Height: '5\' 10"'$  (177.8 cm) ?Your current weight is: Weight: 75.9 kg ?Your Body Mass Index (BMI) is:  BMI (Calculated): 24.01 Following the type of diet specifically designed for you will help prevent another stroke. ?Your goal weight range is:   ?Your goal Body Mass Index (BMI) is 19-24. ?Healthy food habits can help reduce 3 risk factors for stroke:  High cholesterol, hypertension, and excess weight.  ?RESOURCES Stroke/Support Group:  Call [phone] ?  ?STROKE  EDUCATION PROVIDED/REVIEWED AND GIVEN TO PATIENT Stroke warning signs and symptoms ?How to activate emergency medical system (call 911). ?Medications prescribed at discharge. ?Need for follow-up after discharge. ?Personal risk factors for stroke. ?Pneumonia vaccine given: No ?Flu vaccine given: No ?My questions have been answered, the writing is legible, and I understand these instructions.  I will adhere to these goals & educational materials that have been provided to me after my discharge from the hospital.  ? ?  ? ?[place]:   ? ?Home Health:   PT  & OT ?               Agency:[facility] [place] Phone: [phone] ? ?Medical Equipment/Items Ordered: WHEELCHAIR, HEMI WALKER, 3 IN 1 & TUB BENCH ?                                                Agency/Supplier:[facility]   [phone] ? ?MEDICAID TRANSPORT- [phone] TO CERTIFY YOURSELF FOR SERVICES ? ?My questions have been answered and I understand these instructions. I will adhere to these goals and the provided educational materials after my discharge from the hospital. ? ?Patient/Caregiver Signature _______________________________ Date __________ ? ?Clinician Signature _______________________________________ Date __________ ? ?Please bring this form and your medication list with you to all your follow-up doctor's appointments.   ?

## 2021-05-19 NOTE — Progress Notes (Signed)
Inpatient Rehabilitation Discharge Medication Review by a Pharmacist ? ?A complete drug regimen review was completed for this patient to identify any potential clinically significant medication issues. ? ?High Risk Drug Classes Is patient taking? Indication by Medication  ?Antipsychotic No   ?Anticoagulant No   ?Antibiotic No   ?Opioid No   ?Antiplatelet Yes Aspirin, plavix- CVA prophylaxis  ?Hypoglycemics/insulin No   ?Vasoactive Medication Yes Norvasc, Tamsulosin- hypertension, BPH  ?Chemotherapy No   ?Other Yes Celexa- MDD / anxiety ?Omeprazole- GERD ?Crestor- HLD ?Trazodone- Sleep  ? ? ? ?Type of Medication Issue Identified Description of Issue Recommendation(s)  ?Drug Interaction(s) (clinically significant) ?    ?Duplicate Therapy ?    ?Allergy ?    ?No Medication Administration End Date ?    ?Incorrect Dose ?    ?Additional Drug Therapy Needed ?    ?Significant med changes from prior encounter (inform family/care partners about these prior to discharge).    ?Other ?    ? ? ?Clinically significant medication issues were identified that warrant physician communication and completion of prescribed/recommended actions by midnight of the next day:  No ? ?Time spent performing this drug regimen review (minutes):  30 ? ? ?Berlinda Farve BS, PharmD, BCPS ?Clinical Pharmacist ?05/20/2021 9:43 AM ? ?Contact: [phone] after 3 PM ? ?"Be curious, not judgmental..." -Jamal Maes ?

## 2021-05-19 NOTE — Progress Notes (Signed)
?                                                       PROGRESS NOTE ? ? ?Subjective/Complaints: ? ?Spoke to family- they are ready and willing to take pt home tomorrow ?He is still asking to stay longer.  ? ?Lidocaine "falls off" in  5 minutes- however I pulled off the patches last Thursday and Friday AM.  ? ?Asking for a form to be filled out- don't know what he's talking about? Will ask SW.  ? ?ROS: ? ? ?Pt denies SOB, abd pain, CP, N/V/C/D, and vision changes ? ? ? ?Objective: ?  ?No results found. ?Recent Labs  ?  05/19/21 ?0825  ?WBC 6.8  ?HGB 16.0  ?HCT 49.8  ?PLT 288  ? ? ? ? ?Recent Labs  ?  05/19/21 ?0825  ?NA 139  ?K 3.8  ?CL 105  ?CO2 27  ?GLUCOSE 92  ?BUN 11  ?CREATININE 0.98  ?CALCIUM 9.3  ? ? ? ? ?Intake/Output Summary (Last 24 hours) at 05/19/2021 1357 ?Last data filed at 05/19/2021 0500 ?Gross per 24 hour  ?Intake 476 ml  ?Output 325 ml  ?Net 151 ml  ?  ? ?  ? ?Physical Exam: ?Vital Signs ?Blood pressure 123/73, pulse 69, temperature 97.8 ?F (36.6 ?C), temperature source Oral, resp. rate 15, height '5\' 10"'$  (1.778 m), weight 77.6 kg, SpO2 100 %. ? ? ?General: awake, alert, appropriate, NAD ?HENT: conjugate gaze; oropharynx moist ?CV: regular rate; no JVD ?Pulmonary: CTA B/L; no W/R/R- good air movement ?GI: soft, NT, ND, (+)BS ?Psychiatric: appropriate ?Neurological: Ox3 ? ?Skin: ?   General: Skin is warm and dry.  ? ? MS: R neck/scalenes, levator and upper traps- still tight. - but is looser ?Neurological:  ?   Mental Status: He is alert and oriented to person, place, and time.  ?   Comments: Speech is at baseline per pt= per son Arabic, primary language is good ?Intact to light touch x 4 extremities. Motor 5/5 LUE and LLE. RUE 1/5 finger flexors and triceps . RLE 2+/5 prox to distal.  ? ? ?Assessment/Plan: ?1. Functional deficits which require 3+ hours per day of interdisciplinary therapy in a comprehensive inpatient rehab setting. ?Physiatrist is providing close team supervision and 24 hour  management of active medical problems listed below. ?Physiatrist and rehab team continue to assess barriers to discharge/monitor patient progress toward functional and medical goals ? ?Care Tool: ? ?Bathing ? Bathing activity did not occur: Safety/medical concerns ?Body parts bathed by patient: Right arm, Chest, Front perineal area, Abdomen, Right upper leg, Left upper leg, Face, Left arm, Buttocks, Right lower leg, Left lower leg  ? Body parts bathed by helper: Left arm, Buttocks, Left lower leg, Right lower leg ?  ?  ?Bathing assist Assist Level: Supervision/Verbal cueing ?  ?  ?Upper Body Dressing/Undressing ?Upper body dressing Upper body dressing/undressing activity did not occur (including orthotics): Safety/medical concerns ?What is the patient wearing?: Pull over shirt ?   ?Upper body assist Assist Level: Set up assist ?   ?Lower Body Dressing/Undressing ?Lower body dressing ? ? ? Lower body dressing activity did not occur: Safety/medical concerns ?What is the patient wearing?: Pants ? ?  ? ?Lower body assist Assist for lower body dressing: Contact Guard/Touching assist ?   ? ?  Toileting ?Toileting Toileting Activity did not occur (Probation officer and hygiene only): N/A (no void or bm)  ?Toileting assist Assist for toileting: Contact Guard/Touching assist ?  ?  ?Transfers ?Chair/bed transfer ? ?Transfers assist ? Chair/bed transfer activity did not occur: Safety/medical concerns ? ?Chair/bed transfer assist level: Contact Guard/Touching assist ?  ?  ?Locomotion ?Ambulation ? ? ?Ambulation assist ? ? Ambulation activity did not occur: Safety/medical concerns ? ?Assist level: Contact Guard/Touching assist ?Assistive device: Walker-hemi ?Max distance: 150  ? ?Walk 10 feet activity ? ? ?Assist ? Walk 10 feet activity did not occur: Safety/medical concerns ? ?Assist level: Contact Guard/Touching assist ?Assistive device: Walker-hemi  ? ?Walk 50 feet activity ? ? ?Assist Walk 50 feet with 2 turns activity did  not occur: Safety/medical concerns ? ?Assist level: Contact Guard/Touching assist ?Assistive device: Walker-hemi  ? ? ?Walk 150 feet activity ? ? ?Assist Walk 150 feet activity did not occur: Safety/medical concerns ? ?Assist level: Contact Guard/Touching assist ?Assistive device: Walker-hemi ?  ? ?Walk 10 feet on uneven surface  ?activity ? ? ?Assist Walk 10 feet on uneven surfaces activity did not occur: Safety/medical concerns ? ? ?Assist level: Contact Guard/Touching assist ?Assistive device: Walker-hemi  ? ?Wheelchair ? ? ? ? ?Assist Is the patient using a wheelchair?: Yes ?Type of Wheelchair: Manual ?  ? ?Wheelchair assist level: Supervision/Verbal cueing ?Max wheelchair distance: 150 ft  ? ? ?Wheelchair 50 feet with 2 turns activity ? ? ? ?Assist ? ?  ?  ? ? ?Assist Level: Supervision/Verbal cueing  ? ?Wheelchair 150 feet activity  ? ? ? ?Assist ?   ? ? ?Assist Level: Supervision/Verbal cueing  ? ?Blood pressure 123/73, pulse 69, temperature 97.8 ?F (36.6 ?C), temperature source Oral, resp. rate 15, height '5\' 10"'$  (1.778 m), weight 77.6 kg, SpO2 100 %. ? ?Medical Problem List and Plan: ?1. Functional deficits secondary to acute left frontoparietal infarct ?            -patient may  shower ?            -ELOS/Goals: 7-10 days supervision ? D/c 4/11 ? Continue CIR- PT, OT and SLP ? Spent a lot of time explaining that insurance will not pay for him to stay longer and will be discharged on Tuesday-  ? Continue CIR- PT, OT - d/c tomorrow- is ready for d/c.  ?2.  Antithrombotics: ?-DVT/anticoagulation:  Pharmaceutical: Lovenox ?            -antiplatelet therapy: Plavix and aspirin for 3 weeks then aspirin alone ?3. Pain Management: Tylenol ? 3/29- schedule tylenol at 8pm for R shoulder pain- wait on steroid injection of R shoulder for now; also wait on prevention meds for HA's- suggest pt ask for tylenol prn for now ? 3/30- pain in R shoulder better last night- con't regimen ? 4/4- doing estim for R shoulder ?4/5-  will add lidoderm patch for R upper traps 8pm to 8am to help with muscle tightness ? 4/6- was put on wrong part- educated pt on how to place patch- took for patch this AM- is due ? 4/7- took off patch- says it's still tight- will do trigger point injections Tuesday.  ?4. Mood: LCSW to evaluate and provide emotional support ? 3/23- pt c/o feeling depressed over stroke- will start Celexa 20 mg daily. ?3/27- improved mood with celexa- con't regimen   ?            -antipsychotic agents: Not applicable ?5. Neuropsych: This patient  is capable of making decisions on his own behalf. ?6. Skin/Wound Care: Routine skin care checks ?    ?7. Fluids/Electrolytes/Nutrition: Routine I's and O's and follow-up chemistries ?8: Cerebral aneurysm: follow-up IR as outpatient ?9: Hyperlipidemia:: continue Crestor 20 mg daily ?10: Essential hypertension: continue amlodipine 5 mg daily ?Vitals:  ? 05/19/21 0553 05/19/21 1341  ?BP: 130/79 123/73  ?Pulse: 69 69  ?Resp: 18 15  ?Temp: 97.7 ?F (36.5 ?C) 97.8 ?F (36.6 ?C)  ?SpO2: 100% 100%  ? ?11: Polycythemia: Follow-up medical oncology as outpatient ?12: GI prophylaxis: Continue Protonix ?13: BPH: Continue Flomax ? 3/25- peeing well- con't flomax ?14. Constipation- LBM 3 days ago- will give miralax prn ?Pt requesting stool softener ?4/3- will give sorbitol after therapy today 30cc- has been 3 days ?4/4- Had 2 Bms with sorbitol- feeling better ? 4/5- LBM 2 nights ago- denies constipation- will wait 1 more day and then intervene with sorbitol- but add Miralax daily ot regimen- already on senna 2 tabs BID ? 4/6- refused bowel meds yesterday, however did have BM this AM ? 15/ hypokalemia ? 3/22- will replete 40 mEq x1 and recheck  ?3/27- K+ 3.7- con't regimen off K+. ? 3/31- labs Monday ?4/3- K+ 3.4- will give 40 Meq x1- and will likely need 10 mEq daily? Will d/w team tomorrow ?4/4- will start 10 mEq daily.  ? 4/6- labs Monday  ?4/10- K+ 3.8 ?16. Insomnia ?3/28- Will try Low dose Trazodone and  monitor for urinary retention- 50 mg QHS-   ? 3/29- will change trazodone to 100 mg QHS and give at [place] well.  ?3/31- slept 8+ hours last night ?17. Dispo ?3/22- 1 family member can stay overnight. ?3/29-

## 2021-05-19 NOTE — Progress Notes (Signed)
Inpatient Rehabilitation Care Coordinator ?Discharge Note  ? ?Patient Details  ?Name: Aaron Dennis ?MRN: [id number] ?Date of Birth: 1956/01/24 ? ? ?Discharge location: HOME WITH WIFE AND FAMILY MEMBERS WHO CAN PROVIDE CLOSE TO 24/7-WIFE THERE BUT CAN NOT ASSIST DUE TO OWN HEALTH ISSUES ? ?Length of Stay: 21 DAYS ? ?Discharge activity level: SUPERVISION-CGA LEVEL ? ?Home/community participation: ACTIVE ? ?Patient response EP:PIRJJO Literacy - How often do you need to have someone help you when you read instructions, pamphlets, or other written material from your doctor or pharmacy?: Sometimes ? ?Patient response AC:ZYSAYT Isolation - How often do you feel lonely or isolated from those around you?: Never ? ?Services provided included: MD, RD, PT, OT, RN, CM, TR, Pharmacy, SW ? ?Financial Services:  ?Charity fundraiser Utilized: Private Insurance ?[employer]-MEDICARE ? ?Choices offered to/list presented to: PT ? ?Follow-up services arranged:  ?Home Health, DME, Patient/Family has no preference for HH/DME agencies ?Home Health Agency: [facility]  ?  ?DME : [facility], WHEELCHAIR, TUB BENCH AND 3 IN 1 ?  ? ?Patient response to transportation need: ?Is the patient able to respond to transportation needs?: Yes ?In the past 12 months, has lack of transportation kept you from medical appointments or from getting medications?: No ?In the past 12 months, has lack of transportation kept you from meetings, work, or from getting things needed for daily living?: No ? ? ? ?Comments (or additional information): SON AND BROTHER WERE HERE TO OBSERVE IN THERAPIES AND SEE HOW MUCH CARE PT REQUIRES. PT CONCERNED AND WANTED TO STAY HERE LONGER DUE TO WIFE'S INABILITY TO ASSIST HIM. TEAM FELT HE REACHED GOALS AND READY FOR DC. ? ?Patient/Family verbalized understanding of follow-up arrangements:  Yes ? ?Individual responsible for coordination of the follow-up plan: SELF AND Aaron Dennis-SON [phone] ? ?Confirmed  correct DME delivered: Aaron Dennis 05/19/2021   ? ?Aaron Dennis ?

## 2021-05-19 NOTE — Progress Notes (Signed)
Occupational Therapy Session Note ? ?Patient Details  ?Name: Aaron Dennis ?MRN: [id number] ?Date of Birth: April 06, 1955 ? ?Today's Date: 05/19/2021 ?OT Individual Time: 0109-3235 ?OT Individual Time Calculation (min): 45 min  ? ? ?Short Term Goals: ? ?Week 3:  OT Short Term Goal 1 (Week 3): STG=LTG 2/2 ELOS ? ?Skilled Therapeutic Interventions/Progress Updates:  ?  Pt sitting up in w/c, requesting to work on RUE function and strength.  Transported pt to gym via w/c for time management.  Placed mirror in front of pt to provide increased visual feedback to aide in motor relearning.  Assessed MMT and noted ? Shoulder: 3-/5 elevation and ER, 3+/5 IR, 2/5 ext, 2-/5 FF/ABD; ? Elbow: 3-/5 flex/ext;  ? Forearm: 3/5 pronation and 2/5 supination; ? Wrist:  2/5 ext/flex;  ?  Hand: 3/5 grip and 3-/5 extension.  ? ?Brunstrom Stage 2 in Arm and hand. ? ?Pt instructed in isolated wrist extension (using coban wrap digits into closed fist) neuro re-ed providing pt with target to promote external focus to facilitate motor planning.  Pt able to complete partial AROM in gravity eliminated plane to push weightless object on table using wrist extension.  Pt then educated on AAROM forearm supination and AROM pronation using pillow in lap to support extremity into desired position.  Pt needing education on importance of actively moving arm instead of performing PROM only in order to strengthen muscle and motor planning/output.  Pt demonstrated good follow through of AAROM after education.  Returned to room, call bell in reach, seat belt alarm on.   ? ?Therapy Documentation ?Precautions:  ?Precautions ?Precautions: Fall ?Precaution Comments: R-hemi ?Restrictions ?Weight Bearing Restrictions: No ? ? ? ?Therapy/Group: Individual Therapy ? ?Aaron Dennis ?05/19/2021, 12:29 PM ?

## 2021-05-19 NOTE — Plan of Care (Signed)
?  Problem: RH Balance ?Goal: LTG Patient will maintain dynamic standing with ADLs (OT) ?Description: LTG:  Patient will maintain dynamic standing balance with assist during activities of daily living (OT)  ?05/19/2021 1243 by Howerton-Davis, Calypso Hagarty R, OT ?Note: Requires CGA 2/2 decreased attention and motor planning ?05/19/2021 1243 by Felipe Drone, OT ?Outcome: Not Met (add Reason) ?05/19/2021 1241 by Felipe Drone, OT ?Outcome: Not Met (add Reason) ?Note: Requires CGA 2/2 decreased attention and motor planning ?  ?Problem: Sit to Stand ?Goal: LTG:  Patient will perform sit to stand in prep for activites of daily living with assistance level (OT) ?Description: LTG:  Patient will perform sit to stand in prep for activites of daily living with assistance level (OT) ?05/19/2021 1243 by Howerton-Davis, Eddi Hymes R, OT ?Note: Requires CGA 2/2 decreased attention and motor planning ?05/19/2021 1243 by Felipe Drone, OT ?Outcome: Not Met (add Reason) ?05/19/2021 1241 by Felipe Drone, OT ?Outcome: Not Met (add Reason) ?Note: Requires CGA 2/2 decreased attention and motor planning ?  ?Problem: RH Toilet Transfers ?Goal: LTG Patient will perform toilet transfers w/assist (OT) ?Description: LTG: Patient will perform toilet transfers with assist, with/without cues using equipment (OT) ?05/19/2021 1243 by Turner Daniels R, OT ?Note: Requires CGA 2/2 decreased attention and motor planning ?05/19/2021 1243 by Felipe Drone, OT ?Outcome: Not Met (add Reason) ?05/19/2021 1241 by Felipe Drone, OT ?Outcome: Not Met (add Reason) ?  ?

## 2021-05-19 NOTE — Progress Notes (Signed)
Physical Therapy Session Note ? ?Patient Details  ?Name: Aaron Dennis ?MRN: [id number] ?Date of Birth: 12/04/1955 ? ?Today's Date: 05/19/2021 ?PT Missed Time: 30 Minutes ?Missed Time Reason: Patient unwilling to participate ? ?Short Term Goals: ?Week 1:  PT Short Term Goal 1 (Week 1): Pt will initate gait training ?PT Short Term Goal 1 - Progress (Week 1): Met ?PT Short Term Goal 2 (Week 1): pt will propel w/c with min A or better ?PT Short Term Goal 2 - Progress (Week 1): Met ?PT Short Term Goal 3 (Week 1): Pt will perform squat pivot with min A or better ?PT Short Term Goal 3 - Progress (Week 1): Progressing toward goal ?Week 2:  PT Short Term Goal 1 (Week 2): Pt will ambulate 50ft with min assist consistently ?PT Short Term Goal 1 - Progress (Week 2): Met ?PT Short Term Goal 2 (Week 2): Pt will propell WC >150ft with supevision assist ?PT Short Term Goal 2 - Progress (Week 2): Met ?PT Short Term Goal 3 (Week 2): Pt will trasnfer to and from bed with min assist consistently ?PT Short Term Goal 3 - Progress (Week 2): Met ?PT Short Term Goal 4 (Week 2): Pt wil ascend 3 steps with mod assist and LRAD ?PT Short Term Goal 4 - Progress (Week 2): Progressing toward goal ? ?Skilled Therapeutic Interventions/Progress Updates:  ?  Pt seated in w/c with family present. Pt extremely frustrated with lunch coming late and refuses any and all mobility this PM. Refused any education or discussion of d/c plan. Pt remained in w/c with family present and was left with all needs in reach and alarm active. Missed 30 minutes of scheduled therapy.   ? ? ? ? ? ?Therapy/Group: Individual Therapy ? ?Olivia C Brittain ?05/19/2021, 2:46 PM  ?

## 2021-05-19 NOTE — Progress Notes (Signed)
Refused scheduled lidoderm patch and tylenol. Slept good. Aaron Dennis  ?

## 2021-05-19 NOTE — Progress Notes (Signed)
Patient ID: Aaron Dennis, male   DOB: 22-Feb-1955, 66 y.o.   MRN: [id number] Pt has been asking to stay longer to accomplish and make more progress. Team and MD feel he has reached his goals and would not get to the next level if he stayed. Plan on discharge tomorrow.  ?

## 2021-05-19 NOTE — Progress Notes (Signed)
Pt found utilizing tylenol at bedside. Reminded pt/visitor that all home medications must be sent home and we will supply tylenol.  Provided Administered tylenol per pt request.  ?Sheela Stack, LPN  ?

## 2021-05-20 NOTE — Progress Notes (Signed)
?                                                       PROGRESS NOTE ? ? ?Subjective/Complaints: ? ?Pt asking how to file for disability- explained will need to call medical records to get his records to file for disability- and explained how to call them.  ?Also will need f/u with me in 1-2 months.  ? ?ROS: ? ?Pt denies SOB, abd pain, CP, N/V/C/D, and vision changes ? ? ?Objective: ?  ?No results found. ?Recent Labs  ?  05/19/21 ?0825  ?WBC 6.8  ?HGB 16.0  ?HCT 49.8  ?PLT 288  ? ? ? ? ?Recent Labs  ?  05/19/21 ?0825  ?NA 139  ?K 3.8  ?CL 105  ?CO2 27  ?GLUCOSE 92  ?BUN 11  ?CREATININE 0.98  ?CALCIUM 9.3  ? ? ? ?No intake or output data in the 24 hours ending 05/20/21 0903 ?  ? ?  ? ?Physical Exam: ?Vital Signs ?Blood pressure 123/74, pulse 66, temperature 98 ?F (36.7 ?C), temperature source Oral, resp. rate 18, height '5\' 10"'$  (1.778 m), weight 75.9 kg, SpO2 100 %. ? ? ? ?General: awake, alert, appropriate, NAD ?HENT: conjugate gaze; oropharynx moist ?CV: regular rate; no JVD ?Pulmonary: CTA B/L; no W/R/R- good air movement ?GI: soft, NT, ND, (+)BS ?Psychiatric: appropriate ?Neurological: Ox3 ? ?Skin: ?   General: Skin is warm and dry.  ? ? MS: R neck/scalenes, levator and upper traps- still tight. - but is looser ?Neurological:  ?   Mental Status: He is alert and oriented to person, place, and time.  ?   Comments: Speech is at baseline per pt= per son Arabic, primary language is good ?Intact to light touch x 4 extremities. Motor 5/5 LUE and LLE. RUE 1/5 finger flexors and triceps . RLE 2+/5 prox to distal.  ? ? ?Assessment/Plan: ?1. Functional deficits which require 3+ hours per day of interdisciplinary therapy in a comprehensive inpatient rehab setting. ?Physiatrist is providing close team supervision and 24 hour management of active medical problems listed below. ?Physiatrist and rehab team continue to assess barriers to discharge/monitor patient progress toward functional and medical goals ? ?Care  Tool: ? ?Bathing ? Bathing activity did not occur: Safety/medical concerns ?Body parts bathed by patient: Right arm, Chest, Front perineal area, Abdomen, Right upper leg, Left upper leg, Face, Left arm, Buttocks, Right lower leg, Left lower leg  ? Body parts bathed by helper: Left arm, Buttocks, Left lower leg, Right lower leg ?  ?  ?Bathing assist Assist Level: Supervision/Verbal cueing ?  ?  ?Upper Body Dressing/Undressing ?Upper body dressing Upper body dressing/undressing activity did not occur (including orthotics): Safety/medical concerns ?What is the patient wearing?: Pull over shirt ?   ?Upper body assist Assist Level: Set up assist ?   ?Lower Body Dressing/Undressing ?Lower body dressing ? ? ? Lower body dressing activity did not occur: Safety/medical concerns ?What is the patient wearing?: Pants ? ?  ? ?Lower body assist Assist for lower body dressing: Contact Guard/Touching assist ?   ? ?Toileting ?Toileting Toileting Activity did not occur (Probation officer and hygiene only): N/A (no void or bm)  ?Toileting assist Assist for toileting: Contact Guard/Touching assist ?  ?  ?Transfers ?Chair/bed transfer ? ?Transfers assist ? Chair/bed transfer activity did  not occur: Safety/medical concerns ? ?Chair/bed transfer assist level: Contact Guard/Touching assist ?  ?  ?Locomotion ?Ambulation ? ? ?Ambulation assist ? ? Ambulation activity did not occur: Safety/medical concerns ? ?Assist level: Contact Guard/Touching assist ?Assistive device: Walker-hemi ?Max distance: 150  ? ?Walk 10 feet activity ? ? ?Assist ? Walk 10 feet activity did not occur: Safety/medical concerns ? ?Assist level: Contact Guard/Touching assist ?Assistive device: Walker-hemi  ? ?Walk 50 feet activity ? ? ?Assist Walk 50 feet with 2 turns activity did not occur: Safety/medical concerns ? ?Assist level: Contact Guard/Touching assist ?Assistive device: Walker-hemi  ? ? ?Walk 150 feet activity ? ? ?Assist Walk 150 feet activity did not occur:  Safety/medical concerns ? ?Assist level: Contact Guard/Touching assist ?Assistive device: Walker-hemi ?  ? ?Walk 10 feet on uneven surface  ?activity ? ? ?Assist Walk 10 feet on uneven surfaces activity did not occur: Safety/medical concerns ? ? ?Assist level: Contact Guard/Touching assist ?Assistive device: Walker-hemi  ? ?Wheelchair ? ? ? ? ?Assist Is the patient using a wheelchair?: Yes ?Type of Wheelchair: Manual ?  ? ?Wheelchair assist level: Supervision/Verbal cueing ?Max wheelchair distance: 150 ft  ? ? ?Wheelchair 50 feet with 2 turns activity ? ? ? ?Assist ? ?  ?  ? ? ?Assist Level: Supervision/Verbal cueing  ? ?Wheelchair 150 feet activity  ? ? ? ?Assist ?   ? ? ?Assist Level: Supervision/Verbal cueing  ? ?Blood pressure 123/74, pulse 66, temperature 98 ?F (36.7 ?C), temperature source Oral, resp. rate 18, height '5\' 10"'$  (1.778 m), weight 75.9 kg, SpO2 100 %. ? ?Medical Problem List and Plan: ?1. Functional deficits secondary to acute left frontoparietal infarct ?            -patient may  shower ?            -ELOS/Goals: 7-10 days supervision ? D/c 4/11 ? Continue CIR- PT, OT and SLP ? Spent a lot of time explaining that insurance will not pay for him to stay longer and will be discharged on Tuesday-  ? D/c today with family- explained will need f/u with me  ?2.  Antithrombotics: ?-DVT/anticoagulation:  Pharmaceutical: Lovenox ?            -antiplatelet therapy: Plavix and aspirin for 3 weeks then aspirin alone ?3. Pain Management: Tylenol ? 3/29- schedule tylenol at 8pm for R shoulder pain- wait on steroid injection of R shoulder for now; also wait on prevention meds for HA's- suggest pt ask for tylenol prn for now ? 3/30- pain in R shoulder better last night- con't regimen ? 4/4- doing estim for R shoulder ?4/5- will add lidoderm patch for R upper traps 8pm to 8am to help with muscle tightness ? 4/6- was put on wrong part- educated pt on how to place patch- took for patch this AM- is due ? 4/7- took off  patch- says it's still tight- will do trigger point injections Tuesday. ?4/11- decided against trigger point injections- says things better- refused lidoderm patch and tylenol last night.   ?4. Mood: LCSW to evaluate and provide emotional support ? 3/23- pt c/o feeling depressed over stroke- will start Celexa 20 mg daily. ?3/27- improved mood with celexa- con't regimen   ?            -antipsychotic agents: Not applicable ?5. Neuropsych: This patient is capable of making decisions on his own behalf. ?6. Skin/Wound Care: Routine skin care checks ?    ?7. Fluids/Electrolytes/Nutrition: Routine I's and  O's and follow-up chemistries ?8: Cerebral aneurysm: follow-up IR as outpatient ?9: Hyperlipidemia:: continue Crestor 20 mg daily ?10: Essential hypertension: continue amlodipine 5 mg daily ?Vitals:  ? 05/19/21 1942 05/20/21 0510  ?BP: 124/73 123/74  ?Pulse: 82 66  ?Resp: 18 18  ?Temp: (!) 97.5 ?F (36.4 ?C) 98 ?F (36.7 ?C)  ?SpO2: 100% 100%  ? ?11: Polycythemia: Follow-up medical oncology as outpatient ?12: GI prophylaxis: Continue Protonix ?13: BPH: Continue Flomax ? 3/25- peeing well- con't flomax ?14. Constipation- LBM 3 days ago- will give miralax prn ?Pt requesting stool softener ?4/3- will give sorbitol after therapy today 30cc- has been 3 days ?4/4- Had 2 Bms with sorbitol- feeling better ? 4/5- LBM 2 nights ago- denies constipation- will wait 1 more day and then intervene with sorbitol- but add Miralax daily ot regimen- already on senna 2 tabs BID ? 4/6- refused bowel meds yesterday, however did have BM this AM ? 15/ hypokalemia ? 3/22- will replete 40 mEq x1 and recheck  ?3/27- K+ 3.7- con't regimen off K+. ? 3/31- labs Monday ?4/3- K+ 3.4- will give 40 Meq x1- and will likely need 10 mEq daily? Will d/w team tomorrow ?4/4- will start 10 mEq daily.  ? 4/6- labs Monday  ?4/10- K+ 3.8 ?16. Insomnia ?3/28- Will try Low dose Trazodone and monitor for urinary retention- 50 mg QHS-   ? 3/29- will change trazodone to  100 mg QHS and give at [place] well.  ?3/31- slept 8+ hours last night ?17. Dispo ?3/22- 1 family member can stay overnight. ?3/29- d/c date set for 4/11-  ? 4/7- pt says he refuses to leave- he doesn't want to

## 2021-05-20 NOTE — Progress Notes (Signed)
Patient refused his Tylenol  and Lidoderm patch, states he does need it, encouraged but states no. Assisted with repositioning in bed for comfort. Made as comfortable as possible,  ? ?05/20/21 ?Anticipates discharge home with family 05/20/2021. Monitor throughout shift, no acute distress or discomfort,monitored ?

## 2021-05-21 ENCOUNTER — Telehealth: Payer: Self-pay

## 2021-05-21 DIAGNOSIS — I639 Cerebral infarction, unspecified: Secondary | ICD-10-CM | POA: Diagnosis not present

## 2021-05-21 NOTE — Telephone Encounter (Signed)
Transition Care Management Unsuccessful Follow-up Telephone Call ? ?Date of discharge and from where:  05/20/2021, [place] ? ?Attempts:  1st Attempt ? ?Reason for unsuccessful TCM follow-up call:  Left voice message on # [phone], call back requested.  ? ?Need to discuss scheduling a follow up appointment with PCP ? ? ?

## 2021-05-22 ENCOUNTER — Telehealth: Payer: Self-pay

## 2021-05-22 NOTE — Telephone Encounter (Signed)
Transition Care Management Unsuccessful Follow-up Telephone Call ? ?Date of discharge and from where:  05/20/2021, [facility]- inpatient rehab ? ?Attempts:  2nd Attempt ? ?Reason for unsuccessful TCM follow-up call:  Left voice message on # [phone]. Call back requested to this CM or [facility].  Call placed to #  [phone] and the phone just rings, no voicemail option  ? ?Need to discuss scheduling a hospital follow up appointment.  ? ? ?

## 2021-05-23 ENCOUNTER — Telehealth: Payer: Self-pay

## 2021-05-23 NOTE — Telephone Encounter (Signed)
Transition Care Management Unsuccessful Follow-up Telephone Call ?  ?Date of discharge and from where:  05/20/2021, [facility]- inpatient rehab ?  ?Attempts:  3rd Attempt ?  ?Reason for unsuccessful TCM follow-up call:  Left voice message on # [phone]. ? ?Need to discuss scheduling a hospital follow up appointment.  ?

## 2021-05-28 ENCOUNTER — Telehealth: Payer: Self-pay | Admitting: Family Medicine

## 2021-05-28 ENCOUNTER — Telehealth: Payer: Self-pay

## 2021-05-28 NOTE — Telephone Encounter (Signed)
Letter sent to patient requesting he contact this office to schedule a hospital follow up appointment as we have not been able to reach him.  ?

## 2021-05-28 NOTE — Telephone Encounter (Signed)
Copied from [place] [phone]. Topic: General - Other ?>> May 28, 2021 11:03 AM Antonieta Iba C wrote: ?Reason for CRM: Angie wit [facility] is calling in for assistance. She is calling in to see if provider will sign off on some post rehab services? Angie said that the hospital is requesting that pt have OT and PT services but didn't provide the orders.  ? ? [phone] - please assist further - okay to leave voicemail with verbal okay. ?

## 2021-05-28 NOTE — Telephone Encounter (Signed)
Ok to give verbal orders for PT and OT ?

## 2021-05-28 NOTE — Telephone Encounter (Signed)
Okay to give verbal orders?  ?

## 2021-05-29 NOTE — Telephone Encounter (Signed)
Verbal orders were given for the patient. 

## 2021-06-02 ENCOUNTER — Telehealth: Payer: Self-pay | Admitting: Family Medicine

## 2021-06-02 DIAGNOSIS — I69351 Hemiplegia and hemiparesis following cerebral infarction affecting right dominant side: Secondary | ICD-10-CM | POA: Diagnosis not present

## 2021-06-02 DIAGNOSIS — I358 Other nonrheumatic aortic valve disorders: Secondary | ICD-10-CM | POA: Diagnosis not present

## 2021-06-02 DIAGNOSIS — I2510 Atherosclerotic heart disease of native coronary artery without angina pectoris: Secondary | ICD-10-CM | POA: Diagnosis not present

## 2021-06-02 DIAGNOSIS — I2584 Coronary atherosclerosis due to calcified coronary lesion: Secondary | ICD-10-CM | POA: Diagnosis not present

## 2021-06-02 DIAGNOSIS — I10 Essential (primary) hypertension: Secondary | ICD-10-CM | POA: Diagnosis not present

## 2021-06-02 DIAGNOSIS — G319 Degenerative disease of nervous system, unspecified: Secondary | ICD-10-CM | POA: Diagnosis not present

## 2021-06-02 NOTE — Telephone Encounter (Signed)
Copied from [place] [id number]. Topic: General - Inquiry ?>> Jun 02, 2021  1:21 PM Oneta Rack wrote: ?Requesting PT verbal orders for 2x 4 1x 4 and OT Eval ?

## 2021-06-02 NOTE — Telephone Encounter (Addendum)
Zelphia Cairo PT from [facility] [phone] was following up on a hospital follow up appointment. Caller stated patient number has changed to [phone] ?

## 2021-06-03 NOTE — Telephone Encounter (Signed)
Verbal order were left on VM for patient. ?

## 2021-06-03 NOTE — Telephone Encounter (Signed)
Call placed and mailbox is currently full. ?

## 2021-06-04 ENCOUNTER — Telehealth: Payer: Self-pay | Admitting: Family Medicine

## 2021-06-04 NOTE — Telephone Encounter (Signed)
FYI

## 2021-06-04 NOTE — Telephone Encounter (Signed)
Copied from [place]. Topic: General - Other >> Jun 04, 2021 11:39 AM McGill, Nelva Bush wrote: Reason for CRM: LeeAnne from [facility] stated Pt had OT evaluation and has to be moved to next week. LeeAnne calling to advice PCP of delay.

## 2021-06-05 DIAGNOSIS — I358 Other nonrheumatic aortic valve disorders: Secondary | ICD-10-CM | POA: Diagnosis not present

## 2021-06-05 DIAGNOSIS — I69351 Hemiplegia and hemiparesis following cerebral infarction affecting right dominant side: Secondary | ICD-10-CM | POA: Diagnosis not present

## 2021-06-05 DIAGNOSIS — I2584 Coronary atherosclerosis due to calcified coronary lesion: Secondary | ICD-10-CM | POA: Diagnosis not present

## 2021-06-05 DIAGNOSIS — I2510 Atherosclerotic heart disease of native coronary artery without angina pectoris: Secondary | ICD-10-CM | POA: Diagnosis not present

## 2021-06-05 DIAGNOSIS — G319 Degenerative disease of nervous system, unspecified: Secondary | ICD-10-CM | POA: Diagnosis not present

## 2021-06-05 DIAGNOSIS — I10 Essential (primary) hypertension: Secondary | ICD-10-CM | POA: Diagnosis not present

## 2021-06-05 NOTE — Telephone Encounter (Signed)
Call placed to patient. He said that he wanted to be seen by PCP as soon as possible to discuss follow up with specialists. I scheduled him with Dr Margarita Rana  - 06/11/2021.  ?

## 2021-06-09 ENCOUNTER — Telehealth: Payer: Self-pay | Admitting: Family Medicine

## 2021-06-09 DIAGNOSIS — I358 Other nonrheumatic aortic valve disorders: Secondary | ICD-10-CM | POA: Diagnosis not present

## 2021-06-09 DIAGNOSIS — I2510 Atherosclerotic heart disease of native coronary artery without angina pectoris: Secondary | ICD-10-CM | POA: Diagnosis not present

## 2021-06-09 DIAGNOSIS — I10 Essential (primary) hypertension: Secondary | ICD-10-CM | POA: Diagnosis not present

## 2021-06-09 DIAGNOSIS — I69351 Hemiplegia and hemiparesis following cerebral infarction affecting right dominant side: Secondary | ICD-10-CM | POA: Diagnosis not present

## 2021-06-09 DIAGNOSIS — G319 Degenerative disease of nervous system, unspecified: Secondary | ICD-10-CM | POA: Diagnosis not present

## 2021-06-09 DIAGNOSIS — I2584 Coronary atherosclerosis due to calcified coronary lesion: Secondary | ICD-10-CM | POA: Diagnosis not present

## 2021-06-09 NOTE — Telephone Encounter (Signed)
Okay to give verbal order.

## 2021-06-09 NOTE — Telephone Encounter (Signed)
Home Health Verbal Orders - Caller/Agency: [facility] ? ?Callback Number: [phone] ? ?Requesting OT/PT/Skilled Nursing/Social Work/Speech Therapy: OT ? ?Frequency: 2w5, 1w2 ?

## 2021-06-10 DIAGNOSIS — I69351 Hemiplegia and hemiparesis following cerebral infarction affecting right dominant side: Secondary | ICD-10-CM | POA: Diagnosis not present

## 2021-06-10 DIAGNOSIS — I2584 Coronary atherosclerosis due to calcified coronary lesion: Secondary | ICD-10-CM | POA: Diagnosis not present

## 2021-06-10 DIAGNOSIS — I2510 Atherosclerotic heart disease of native coronary artery without angina pectoris: Secondary | ICD-10-CM | POA: Diagnosis not present

## 2021-06-10 DIAGNOSIS — G319 Degenerative disease of nervous system, unspecified: Secondary | ICD-10-CM | POA: Diagnosis not present

## 2021-06-10 DIAGNOSIS — I10 Essential (primary) hypertension: Secondary | ICD-10-CM | POA: Diagnosis not present

## 2021-06-10 DIAGNOSIS — I358 Other nonrheumatic aortic valve disorders: Secondary | ICD-10-CM | POA: Diagnosis not present

## 2021-06-10 NOTE — Telephone Encounter (Signed)
Called [place] at [phone] verbal orders given ?

## 2021-06-11 ENCOUNTER — Ambulatory Visit: Payer: Medicare Other | Attending: Family Medicine | Admitting: Family Medicine

## 2021-06-11 ENCOUNTER — Ambulatory Visit: Payer: Medicare Other | Admitting: Orthopaedic Surgery

## 2021-06-11 ENCOUNTER — Encounter: Payer: Self-pay | Admitting: Family Medicine

## 2021-06-11 ENCOUNTER — Other Ambulatory Visit: Payer: Self-pay

## 2021-06-11 VITALS — BP 123/78 | HR 74 | Ht 70.0 in | Wt 160.0 lb

## 2021-06-11 DIAGNOSIS — K5909 Other constipation: Secondary | ICD-10-CM

## 2021-06-11 DIAGNOSIS — I10 Essential (primary) hypertension: Secondary | ICD-10-CM | POA: Diagnosis not present

## 2021-06-11 DIAGNOSIS — I69351 Hemiplegia and hemiparesis following cerebral infarction affecting right dominant side: Secondary | ICD-10-CM

## 2021-06-11 DIAGNOSIS — Z8673 Personal history of transient ischemic attack (TIA), and cerebral infarction without residual deficits: Secondary | ICD-10-CM

## 2021-06-11 DIAGNOSIS — I726 Aneurysm of vertebral artery: Secondary | ICD-10-CM

## 2021-06-11 DIAGNOSIS — F0631 Mood disorder due to known physiological condition with depressive features: Secondary | ICD-10-CM

## 2021-06-11 MED ORDER — TAMSULOSIN HCL 0.4 MG PO CAPS
0.4000 mg | ORAL_CAPSULE | Freq: Every day | ORAL | 1 refills | Status: DC
  Filled 2021-06-11: qty 90, 90d supply, fill #0
  Filled 2021-09-09: qty 90, 90d supply, fill #1

## 2021-06-11 MED ORDER — ROSUVASTATIN CALCIUM 20 MG PO TABS
20.0000 mg | ORAL_TABLET | Freq: Every day | ORAL | 1 refills | Status: DC
  Filled 2021-06-11: qty 90, 90d supply, fill #0
  Filled 2021-09-09: qty 90, 90d supply, fill #1

## 2021-06-11 MED ORDER — TRAZODONE HCL 100 MG PO TABS
100.0000 mg | ORAL_TABLET | Freq: Every day | ORAL | 1 refills | Status: DC
  Filled 2021-06-11: qty 90, 90d supply, fill #0

## 2021-06-11 MED ORDER — MELOXICAM 7.5 MG PO TABS
7.5000 mg | ORAL_TABLET | Freq: Every day | ORAL | 1 refills | Status: DC
  Filled 2021-06-11: qty 90, 90d supply, fill #0

## 2021-06-11 MED ORDER — AMLODIPINE BESYLATE 5 MG PO TABS
5.0000 mg | ORAL_TABLET | Freq: Every day | ORAL | 1 refills | Status: DC
  Filled 2021-06-11: qty 90, 90d supply, fill #0
  Filled 2021-09-09: qty 90, 90d supply, fill #1

## 2021-06-11 MED ORDER — OMEPRAZOLE 40 MG PO CPDR
40.0000 mg | DELAYED_RELEASE_CAPSULE | Freq: Every day | ORAL | 1 refills | Status: DC
Start: 2021-06-11 — End: 2021-12-11
  Filled 2021-06-11: qty 90, 90d supply, fill #0
  Filled 2021-09-09: qty 90, 90d supply, fill #1

## 2021-06-11 MED ORDER — ASPIRIN 81 MG PO TBEC
81.0000 mg | DELAYED_RELEASE_TABLET | Freq: Every day | ORAL | 1 refills | Status: DC
  Filled 2021-06-11: qty 90, 90d supply, fill #0

## 2021-06-11 MED ORDER — POLYETHYLENE GLYCOL 3350 17 GM/SCOOP PO POWD
17.0000 g | Freq: Every day | ORAL | 1 refills | Status: DC
  Filled 2021-06-11: qty 510, 30d supply, fill #0
  Filled 2021-09-09: qty 510, 30d supply, fill #1

## 2021-06-11 MED ORDER — CITALOPRAM HYDROBROMIDE 20 MG PO TABS
20.0000 mg | ORAL_TABLET | Freq: Every day | ORAL | 1 refills | Status: DC
  Filled 2021-06-11: qty 90, 90d supply, fill #0
  Filled 2021-09-09: qty 90, 90d supply, fill #1

## 2021-06-11 NOTE — Progress Notes (Signed)
Needs referrals ?Having constipation. ?

## 2021-06-11 NOTE — Progress Notes (Signed)
? ?Subjective:  ?Patient ID: Aaron Dennis, male    DOB: 08/20/1955  Age: 66 y.o. MRN: [id number] ? ?CC: Hospitalization Follow-up ? ? ?HPI ?Aaron Dennis is a 66 y.o. year old male with a history of hypertension, polycythemia (s/p phlebotomy in the past), chronic thoracolumbar pain, GERD, CVA of L MCA in 04/2021 here for hospital follow-up. ?He was hospitalized at [facility] from 04/29/2021 through 05/20/2021 after he had presented with right arm weakness and MRI had revealed: ?IMPRESSION: ?1. Acute infarct in the left frontoparietal white matter. Mild ?associated edema without mass effect. ?2. Additional moderate scattered T2/FLAIR hyperintensities the white ?matter with small foci of encephalomalacia in the periatrial white ?matter bilateral. Findings are nonspecific but most likely related ?to chronic microvascular disease. Chronic demyelination is a less ?likely differential consideration. ?3. Diffuse T1 hypointensity of the bone marrow. This finding is ?nonspecific, but can be seen with chronic anemia, chronic hypoxia, ?or underlying lymphoproliferative disorder. ?  ?CT angiogram head and neck revealed: ?IMPRESSION: ?1. No large vessel occlusion. ?2. Moderate stenosis of the distal left intradural vertebral artery, ?mid basilar artery and proximal left P2 PCA. ?3. Approximately 4 x 3 mm superior and laterally directed aneurysm ?arising from the distal right intradural vertebral artery. ?4. Chronic mixed lytic and sclerotic changes throughout the ?skeleton, which could represent myelofibrosis or metabolic bone ?disorder. Findings further evaluated on prior PET-CT. ?  ?Treated with dual antiplatelet therapy-Plavix and aspirin for 3 weeks after which Plavix was discontinued and he was maintained on aspirin.  Subsequently transferred to comprehensive inpatient rehab. ?Recommendation at discharge was follow-up with neurology, interventional radiology and with oncology. ? ?Interval History: ?He presents today  accompanied by his brother and ambulates with the aid of a walker.  He has a right arm brace.  He reports undergoing home PT twice a week which has been beneficial.  His right hemiparesis is improving but he does continue to have right shoulder pain and is requesting prescription for meloxicam which she took in the past for pain. ? ?He attempted calling neurology and interventional radiology for appointment but was unsuccessful. ? ?Endorses compliance with his medications and reports doing well emotionally.  He states constipated and is requesting treatment for this as stool softeners have been ineffective. ?Past Medical History:  ?Diagnosis Date  ? GERD (gastroesophageal reflux disease)   ? Hypertension   ? Lung nodule   ? 7 mm LUL nodule  ? ? ?No past surgical history on file. ? ?Family History  ?Problem Relation Age of Onset  ? Healthy Mother   ? Diabetes Brother   ? Colon polyps Neg Hx   ? Colon cancer Neg Hx   ? Esophageal cancer Neg Hx   ? Rectal cancer Neg Hx   ? Stomach cancer Neg Hx   ? ? ?Social History  ? ?Socioeconomic History  ? Marital status: Married  ?  Spouse name: Not on file  ? Number of children: Not on file  ? Years of education: Not on file  ? Highest education level: Not on file  ?Occupational History  ? Not on file  ?Tobacco Use  ? Smoking status: Former  ? Smokeless tobacco: Never  ? Tobacco comments:  ?  quit Jan 2018  ?Vaping Use  ? Vaping Use: Never used  ?Substance and Sexual Activity  ? Alcohol use: No  ? Drug use: No  ? Sexual activity: Not Currently  ?Other Topics Concern  ? Not on file  ?Social History  Narrative  ? Not on file  ? ?Social Determinants of Health  ? ?Financial Resource Strain: Not on file  ?Food Insecurity: Not on file  ?Transportation Needs: Not on file  ?Physical Activity: Not on file  ?Stress: Not on file  ?Social Connections: Not on file  ? ? ?Allergies  ?Allergen Reactions  ? Beef-Derived Products   ? Fish-Derived Products   ?  Does not eat fish  ? Pork-Derived  Products Other (See Comments)  ?  Cultural preference ?  ? ? ?Outpatient Medications Prior to Visit  ?Medication Sig Dispense Refill  ? acetaminophen (TYLENOL) 325 MG tablet Take 1-2 tablets (325-650 mg total) by mouth every 4 (four) hours as needed for mild pain.    ? hydrocortisone-pramoxine (ANALPRAM HC) 2.5-1 % rectal cream Place 1 application rectally 3 (three) times daily. 30 g 1  ? lidocaine (LIDODERM) 5 % Place 1 patch onto the skin daily. Remove & Discard patch within 12 hours or as directed by MD 30 patch 0  ? meclizine (ANTIVERT) 25 MG tablet Take 1 tablet (25 mg total) by mouth 2 (two) times daily as needed for dizziness. 60 tablet 1  ? vitamin B-12 (CYANOCOBALAMIN) 1000 MCG tablet Take 1 tablet (1,000 mcg total) by mouth daily. 90 tablet 1  ? amLODipine (NORVASC) 5 MG tablet Take 1 tablet (5 mg total) by mouth daily. 30 tablet 0  ? aspirin 81 MG EC tablet Take 1 tablet (81 mg total) by mouth daily. Swallow whole. 30 tablet 0  ? citalopram (CELEXA) 20 MG tablet Take 1 tablet (20 mg total) by mouth daily. 30 tablet 0  ? omeprazole (PRILOSEC) 40 MG capsule Take 1 capsule (40 mg total) by mouth daily. 30 capsule 0  ? polyethylene glycol powder (GLYCOLAX/MIRALAX) 17 GM/SCOOP powder Take 17 g by mouth daily. 3350 g 1  ? rosuvastatin (CRESTOR) 20 MG tablet Take 1 tablet (20 mg total) by mouth daily. 30 tablet 0  ? tamsulosin (FLOMAX) 0.4 MG CAPS capsule Take 1 capsule (0.4 mg total) by mouth daily. 30 capsule 0  ? traZODone (DESYREL) 100 MG tablet Take 1 tablet (100 mg total) by mouth at bedtime. 30 tablet 0  ? ?No facility-administered medications prior to visit.  ? ? ? ?ROS ?Review of Systems  ?Constitutional:  Negative for activity change and appetite change.  ?HENT:  Negative for sinus pressure and sore throat.   ?Eyes:  Negative for visual disturbance.  ?Respiratory:  Negative for cough, chest tightness and shortness of breath.   ?Cardiovascular:  Negative for chest pain and leg swelling.   ?Gastrointestinal:  Positive for constipation. Negative for abdominal distention, abdominal pain and diarrhea.  ?Endocrine: Negative.   ?Genitourinary:  Negative for dysuria.  ?Musculoskeletal:   ?     See HPI  ?Skin:  Negative for rash.  ?Allergic/Immunologic: Negative.   ?Neurological:  Negative for weakness, light-headedness and numbness.  ?Psychiatric/Behavioral:  Negative for dysphoric mood and suicidal ideas.   ? ?Objective:  ?BP 123/78   Pulse 74   Ht '5\' 10"'$  (1.778 m)   Wt 160 lb (72.6 kg)   SpO2 100%   BMI 22.96 kg/m?  ? ? ?  06/11/2021  ? 10:13 AM 05/20/2021  ?  5:10 AM 05/19/2021  ?  7:42 PM  ?BP/Weight  ?Systolic BP 254 270 623  ?Diastolic BP 78 74 73  ?Wt. (Lbs) 160 167.33   ?BMI 22.96 kg/m2 24.01 kg/m2   ? ? ? ? ?Physical Exam ?Constitutional:   ?  Appearance: He is well-developed.  ?Cardiovascular:  ?   Rate and Rhythm: Normal rate.  ?   Heart sounds: Normal heart sounds. No murmur heard. ?Pulmonary:  ?   Effort: Pulmonary effort is normal.  ?   Breath sounds: Normal breath sounds. No wheezing or rales.  ?Chest:  ?   Chest wall: No tenderness.  ?Abdominal:  ?   General: Bowel sounds are normal. There is no distension.  ?   Palpations: Abdomen is soft. There is no mass.  ?   Tenderness: There is no abdominal tenderness.  ?Musculoskeletal:  ?   Right lower leg: No edema.  ?   Left lower leg: No edema.  ?   Comments: Atrophy of right biceps demonstrable ?Reduced range of motion in right upper extremity  ?Neurological:  ?   Mental Status: He is alert and oriented to person, place, and time.  ?   Comments: Reduced strength in right upper extremity and right lower extremity ?Strength in left upper and left lower extremities are normal  ?Psychiatric:     ?   Mood and Affect: Mood normal.  ? ? ? ?  Latest Ref Rng & Units 05/19/2021  ?  8:25 AM 05/12/2021  ?  7:37 AM 05/05/2021  ?  7:00 AM  ?CMP  ?Glucose 70 - 99 mg/dL 92   101   91    ?BUN 8 - 23 mg/dL '11   18   15    '$ ?Creatinine 0.61 - 1.24 mg/dL 0.98   0.95    0.89    ?Sodium 135 - 145 mmol/L 139   139   136    ?Potassium 3.5 - 5.1 mmol/L 3.8   3.4   3.7    ?Chloride 98 - 111 mmol/L 105   104   106    ?CO2 22 - 32 mmol/L '27   28   23    '$ ?Calcium 8.9 - 10.3 mg/dL 9.3   9.1   8.9

## 2021-06-11 NOTE — Patient Instructions (Signed)
Cognitive Rehabilitation After a Stroke ?A stroke is caused by not getting enough blood flow to the brain. This can affect a person's thinking and memory. Rehabilitation is important for improving these areas. It can help with attention span, decision-making (executive function), and with the ability to recall information, such as words and objects. It also can help with language and perception. Rehabilitation can help to improve quality of life. ?What is cognitive rehabilitation? ?Cognitive rehabilitation is a program to help you improve your thinking skills after a stroke. It can help with memory, problem-solving, and communication skills. Therapy focuses on: ?Improving brain function. This may involve learning to break down tasks into simple steps. ?Coping with thinking problems. You might learn ways to help improve your memory or do activities that stimulate memory. These may include naming objects or describing pictures. ?Types of rehabilitation ?Cognitive rehabilitation refers to a group of therapies and may include: ?Speech-language therapy to help you understand and communicate better. ?Occupational therapy to help you with daily activities. ?Music therapy to help with stress, anxiety, and depression. This may involve listening to music, singing, or playing instruments. ?Physical therapy to help you get stronger and move better. ?These therapies may involve: ?Helping you to learn ways to cope with memory problems, such as setting alarms to remind you to take medicines. You also may learn new strategies to help your memory. ?Using virtual reality or video games to help you remember words, names of objects, and other things. ?Exercise to help increase blood flow to the brain. ?Summary ?After a stroke, some people have problems with thinking, memory, language, communication, and problem-solving. ?Cognitive rehabilitation is a program to help you regain brain function and learn skills to cope with thinking  problems. ?Rehabilitation can help to improve quality of life. ?Cognitive rehabilitation may include speech-language therapy, occupational therapy, music therapy, and physical therapy. ?This information is not intended to replace advice given to you by your health care provider. Make sure you discuss any questions you have with your health care provider. ?Document Revised: 12/29/2019 Document Reviewed: 12/29/2019 ?Elsevier Patient Education ? [employer]. ? ?

## 2021-06-12 DIAGNOSIS — I358 Other nonrheumatic aortic valve disorders: Secondary | ICD-10-CM | POA: Diagnosis not present

## 2021-06-12 DIAGNOSIS — G319 Degenerative disease of nervous system, unspecified: Secondary | ICD-10-CM | POA: Diagnosis not present

## 2021-06-12 DIAGNOSIS — I2584 Coronary atherosclerosis due to calcified coronary lesion: Secondary | ICD-10-CM | POA: Diagnosis not present

## 2021-06-12 DIAGNOSIS — I2510 Atherosclerotic heart disease of native coronary artery without angina pectoris: Secondary | ICD-10-CM | POA: Diagnosis not present

## 2021-06-12 DIAGNOSIS — I69351 Hemiplegia and hemiparesis following cerebral infarction affecting right dominant side: Secondary | ICD-10-CM | POA: Diagnosis not present

## 2021-06-12 DIAGNOSIS — I10 Essential (primary) hypertension: Secondary | ICD-10-CM | POA: Diagnosis not present

## 2021-06-13 ENCOUNTER — Other Ambulatory Visit: Payer: Self-pay

## 2021-06-13 DIAGNOSIS — I69351 Hemiplegia and hemiparesis following cerebral infarction affecting right dominant side: Secondary | ICD-10-CM | POA: Diagnosis not present

## 2021-06-13 DIAGNOSIS — I2510 Atherosclerotic heart disease of native coronary artery without angina pectoris: Secondary | ICD-10-CM | POA: Diagnosis not present

## 2021-06-13 DIAGNOSIS — I2584 Coronary atherosclerosis due to calcified coronary lesion: Secondary | ICD-10-CM | POA: Diagnosis not present

## 2021-06-13 DIAGNOSIS — G319 Degenerative disease of nervous system, unspecified: Secondary | ICD-10-CM | POA: Diagnosis not present

## 2021-06-13 DIAGNOSIS — I10 Essential (primary) hypertension: Secondary | ICD-10-CM | POA: Diagnosis not present

## 2021-06-13 DIAGNOSIS — I358 Other nonrheumatic aortic valve disorders: Secondary | ICD-10-CM | POA: Diagnosis not present

## 2021-06-16 DIAGNOSIS — I2584 Coronary atherosclerosis due to calcified coronary lesion: Secondary | ICD-10-CM | POA: Diagnosis not present

## 2021-06-16 DIAGNOSIS — I358 Other nonrheumatic aortic valve disorders: Secondary | ICD-10-CM | POA: Diagnosis not present

## 2021-06-16 DIAGNOSIS — I69351 Hemiplegia and hemiparesis following cerebral infarction affecting right dominant side: Secondary | ICD-10-CM | POA: Diagnosis not present

## 2021-06-16 DIAGNOSIS — I2510 Atherosclerotic heart disease of native coronary artery without angina pectoris: Secondary | ICD-10-CM | POA: Diagnosis not present

## 2021-06-16 DIAGNOSIS — G319 Degenerative disease of nervous system, unspecified: Secondary | ICD-10-CM | POA: Diagnosis not present

## 2021-06-16 DIAGNOSIS — I10 Essential (primary) hypertension: Secondary | ICD-10-CM | POA: Diagnosis not present

## 2021-06-18 ENCOUNTER — Other Ambulatory Visit: Payer: Self-pay | Admitting: Internal Medicine

## 2021-06-18 ENCOUNTER — Telehealth: Payer: Self-pay | Admitting: Internal Medicine

## 2021-06-18 DIAGNOSIS — D751 Secondary polycythemia: Secondary | ICD-10-CM

## 2021-06-18 NOTE — Telephone Encounter (Signed)
.  Called patient to schedule appointment per 5/10 inbasket, patient is aware of date and time.   ?

## 2021-06-19 DIAGNOSIS — I69351 Hemiplegia and hemiparesis following cerebral infarction affecting right dominant side: Secondary | ICD-10-CM | POA: Diagnosis not present

## 2021-06-19 DIAGNOSIS — I2584 Coronary atherosclerosis due to calcified coronary lesion: Secondary | ICD-10-CM | POA: Diagnosis not present

## 2021-06-19 DIAGNOSIS — I358 Other nonrheumatic aortic valve disorders: Secondary | ICD-10-CM | POA: Diagnosis not present

## 2021-06-19 DIAGNOSIS — I2510 Atherosclerotic heart disease of native coronary artery without angina pectoris: Secondary | ICD-10-CM | POA: Diagnosis not present

## 2021-06-19 DIAGNOSIS — G319 Degenerative disease of nervous system, unspecified: Secondary | ICD-10-CM | POA: Diagnosis not present

## 2021-06-19 DIAGNOSIS — I10 Essential (primary) hypertension: Secondary | ICD-10-CM | POA: Diagnosis not present

## 2021-06-20 DIAGNOSIS — I358 Other nonrheumatic aortic valve disorders: Secondary | ICD-10-CM | POA: Diagnosis not present

## 2021-06-20 DIAGNOSIS — I10 Essential (primary) hypertension: Secondary | ICD-10-CM | POA: Diagnosis not present

## 2021-06-20 DIAGNOSIS — I2584 Coronary atherosclerosis due to calcified coronary lesion: Secondary | ICD-10-CM | POA: Diagnosis not present

## 2021-06-20 DIAGNOSIS — I2510 Atherosclerotic heart disease of native coronary artery without angina pectoris: Secondary | ICD-10-CM | POA: Diagnosis not present

## 2021-06-20 DIAGNOSIS — I69351 Hemiplegia and hemiparesis following cerebral infarction affecting right dominant side: Secondary | ICD-10-CM | POA: Diagnosis not present

## 2021-06-20 DIAGNOSIS — G319 Degenerative disease of nervous system, unspecified: Secondary | ICD-10-CM | POA: Diagnosis not present

## 2021-06-23 DIAGNOSIS — G319 Degenerative disease of nervous system, unspecified: Secondary | ICD-10-CM | POA: Diagnosis not present

## 2021-06-23 DIAGNOSIS — I358 Other nonrheumatic aortic valve disorders: Secondary | ICD-10-CM | POA: Diagnosis not present

## 2021-06-23 DIAGNOSIS — I2584 Coronary atherosclerosis due to calcified coronary lesion: Secondary | ICD-10-CM | POA: Diagnosis not present

## 2021-06-23 DIAGNOSIS — I10 Essential (primary) hypertension: Secondary | ICD-10-CM | POA: Diagnosis not present

## 2021-06-23 DIAGNOSIS — I69351 Hemiplegia and hemiparesis following cerebral infarction affecting right dominant side: Secondary | ICD-10-CM | POA: Diagnosis not present

## 2021-06-23 DIAGNOSIS — I2510 Atherosclerotic heart disease of native coronary artery without angina pectoris: Secondary | ICD-10-CM | POA: Diagnosis not present

## 2021-06-24 DIAGNOSIS — I2584 Coronary atherosclerosis due to calcified coronary lesion: Secondary | ICD-10-CM | POA: Diagnosis not present

## 2021-06-24 DIAGNOSIS — I10 Essential (primary) hypertension: Secondary | ICD-10-CM | POA: Diagnosis not present

## 2021-06-24 DIAGNOSIS — I2510 Atherosclerotic heart disease of native coronary artery without angina pectoris: Secondary | ICD-10-CM | POA: Diagnosis not present

## 2021-06-24 DIAGNOSIS — G319 Degenerative disease of nervous system, unspecified: Secondary | ICD-10-CM | POA: Diagnosis not present

## 2021-06-24 DIAGNOSIS — I69351 Hemiplegia and hemiparesis following cerebral infarction affecting right dominant side: Secondary | ICD-10-CM | POA: Diagnosis not present

## 2021-06-24 DIAGNOSIS — I358 Other nonrheumatic aortic valve disorders: Secondary | ICD-10-CM | POA: Diagnosis not present

## 2021-06-25 ENCOUNTER — Inpatient Hospital Stay: Payer: Medicare Other

## 2021-06-25 ENCOUNTER — Inpatient Hospital Stay: Payer: Medicare Other | Attending: Internal Medicine | Admitting: Internal Medicine

## 2021-06-25 ENCOUNTER — Other Ambulatory Visit: Payer: Self-pay

## 2021-06-25 VITALS — BP 111/78 | HR 76

## 2021-06-25 VITALS — BP 123/82 | HR 78 | Temp 97.9°F | Resp 17 | Wt 158.5 lb

## 2021-06-25 DIAGNOSIS — D751 Secondary polycythemia: Secondary | ICD-10-CM

## 2021-06-25 DIAGNOSIS — M545 Low back pain, unspecified: Secondary | ICD-10-CM

## 2021-06-25 DIAGNOSIS — R748 Abnormal levels of other serum enzymes: Secondary | ICD-10-CM | POA: Diagnosis not present

## 2021-06-25 DIAGNOSIS — N400 Enlarged prostate without lower urinary tract symptoms: Secondary | ICD-10-CM | POA: Diagnosis not present

## 2021-06-25 DIAGNOSIS — D7589 Other specified diseases of blood and blood-forming organs: Secondary | ICD-10-CM | POA: Diagnosis not present

## 2021-06-25 DIAGNOSIS — I10 Essential (primary) hypertension: Secondary | ICD-10-CM

## 2021-06-25 DIAGNOSIS — Z8673 Personal history of transient ischemic attack (TIA), and cerebral infarction without residual deficits: Secondary | ICD-10-CM | POA: Diagnosis not present

## 2021-06-25 DIAGNOSIS — Z7982 Long term (current) use of aspirin: Secondary | ICD-10-CM

## 2021-06-25 LAB — CBC WITH DIFFERENTIAL (CANCER CENTER ONLY)
Abs Immature Granulocytes: 0.01 10*3/uL (ref 0.00–0.07)
Basophils Absolute: 0 10*3/uL (ref 0.0–0.1)
Basophils Relative: 1 %
Eosinophils Absolute: 0 10*3/uL (ref 0.0–0.5)
Eosinophils Relative: 0 %
HCT: 48.1 % (ref 39.0–52.0)
Hemoglobin: 16.4 g/dL (ref 13.0–17.0)
Immature Granulocytes: 0 %
Lymphocytes Relative: 29 %
Lymphs Abs: 1.9 10*3/uL (ref 0.7–4.0)
MCH: 27.6 pg (ref 26.0–34.0)
MCHC: 34.1 g/dL (ref 30.0–36.0)
MCV: 81 fL (ref 80.0–100.0)
Monocytes Absolute: 0.5 10*3/uL (ref 0.1–1.0)
Monocytes Relative: 8 %
Neutro Abs: 4.1 10*3/uL (ref 1.7–7.7)
Neutrophils Relative %: 62 %
Platelet Count: 256 10*3/uL (ref 150–400)
RBC: 5.94 MIL/uL — ABNORMAL HIGH (ref 4.22–5.81)
RDW: 14 % (ref 11.5–15.5)
WBC Count: 6.6 10*3/uL (ref 4.0–10.5)
nRBC: 0 % (ref 0.0–0.2)

## 2021-06-25 LAB — CMP (CANCER CENTER ONLY)
ALT: 22 U/L (ref 0–44)
AST: 21 U/L (ref 15–41)
Albumin: 4.4 g/dL (ref 3.5–5.0)
Alkaline Phosphatase: 133 U/L — ABNORMAL HIGH (ref 38–126)
Anion gap: 7 (ref 5–15)
BUN: 13 mg/dL (ref 8–23)
CO2: 27 mmol/L (ref 22–32)
Calcium: 9.5 mg/dL (ref 8.9–10.3)
Chloride: 103 mmol/L (ref 98–111)
Creatinine: 0.91 mg/dL (ref 0.61–1.24)
GFR, Estimated: >60 mL/min (ref >60)
Glucose, Bld: 97 mg/dL (ref 70–99)
Potassium: 3.5 mmol/L (ref 3.5–5.1)
Sodium: 137 mmol/L (ref 135–145)
Total Bilirubin: 2.4 mg/dL — ABNORMAL HIGH (ref 0.3–1.2)
Total Protein: 7.5 g/dL (ref 6.5–8.1)

## 2021-06-25 LAB — IRON AND IRON BINDING CAPACITY (CC-WL,HP ONLY)
Iron: 73 ug/dL (ref 45–182)
Saturation Ratios: 21 % (ref 17.9–39.5)
TIBC: 344 ug/dL (ref 250–450)
UIBC: 271 ug/dL (ref 117–376)

## 2021-06-25 LAB — LACTATE DEHYDROGENASE: LDH: 136 U/L (ref 98–192)

## 2021-06-25 LAB — FERRITIN: Ferritin: 48 ng/mL (ref 24–336)

## 2021-06-25 NOTE — Progress Notes (Signed)
Phlebotomy for @ 400 grams L antecubetal.  Pt tol well.  Instructed to drink plenty of fluids today.  Refreshments offered.  D/c home with brother.   ?

## 2021-06-25 NOTE — Progress Notes (Signed)
? ? [place] ?[phone]   [phone] ? ?CONSULT NOTE ? ?REFERRING PHYSICIAN: Dr. Charlott Rakes ? ?REASON FOR CONSULTATION:  ?66 years old African male with polycythemia and recent stroke. ? ?HPI ?Aaron Dennis is a 66 y.o. male with past medical history significant for hypertension, GERD, polycythemia as well as recent stroke.  The patient had a history of polycythemia for several years.  He also had some sclerotic bone lesions that was suspicious for myeloproliferative disorder.  I saw him in 2019 and JAK2.  His erythropoietin level was normal at 12.2. JAK-2 Mutation panel was negative.  He also had a bone marrow biopsy and aspirate on April 30, 2017 that showed hypercellular with pan myeloid proliferation including increased number of megakaryocytes, some of which display large and slightly atypical morphology.  No increase in blast cells identified and the overall findings were not consider specific but an early myeloproliferative neoplasm was not excluded.  The patient had normal iron study and ferritin at that time.  He had a PET scan on April 19, 2017 that showed chronic mixed and lytic sclerotic changes throughout the skeleton that could represent myelofibrosis or metabolic bone disorder.  He also had a stable left upper lobe pulmonary nodule measuring 0.5 cm at that time.  This was followed by repeat CT scan of the chest on December 21, 2018 and it showed no acute cardiopulmonary abnormality and there was a stable 0.5 cm left upper lobe lung nodule and unchanged diffuse multi sclerotic appearance of the visualized osseous structures again reflecting metabolic bone disorder, myelofibrosis or other marrow infiltrative process.  In March 2023 the patient was admitted to [facility] with right-sided weakness over several days and MRI of the brain showed acute infarct of the left frontoparietal lobe with weakness in the right arm and right lower extremity.  The  patient was initially treated with Plavix and aspirin and later on Plavix was discontinued and he is currently on aspirin 81 mg p.o. daily.  He spent few weeks in the rehabilitation center.  He was referred back to me today for evaluation and recommendation regarding his polycythemia.  He has renal ultrasound that showed no hydronephrosis but he has prostatomegaly.  The kidneys are normal in size with no mass or other suspicious lesion. ?When seen today the patient is feeling much better except for the weakness in the right upper extremity.  He has improved strength in the right lower extremity.  He denied having any current chest pain, shortness of breath, cough or hemoptysis.  He has no nausea, vomiting, diarrhea or constipation.  He has no headache or visual changes.  He has no recent weight loss or night sweats. ?Family history significant for brother with diabetes mellitus. ?The patient is married and has 3 children.  He used to work as a Architect.  He was accompanied by his brother Murrell Redden today.  He has a history of smoking [place] but quit few years ago.  He has no history of alcohol or drug abuse. ?HPI ? ?Past Medical History:  ?Diagnosis Date  ? GERD (gastroesophageal reflux disease)   ? Hypertension   ? Lung nodule   ? 7 mm LUL nodule  ? ? ?No past surgical history on file. ? ?Family History  ?Problem Relation Age of Onset  ? Healthy Mother   ? Diabetes Brother   ? Colon polyps Neg Hx   ? Colon cancer Neg Hx   ? Esophageal cancer Neg  Hx   ? Rectal cancer Neg Hx   ? Stomach cancer Neg Hx   ? ? ?Social History ?Social History  ? ?Tobacco Use  ? Smoking status: Former  ? Smokeless tobacco: Never  ? Tobacco comments:  ?  quit Jan 2018  ?Vaping Use  ? Vaping Use: Never used  ?Substance Use Topics  ? Alcohol use: No  ? Drug use: No  ? ? ?Allergies  ?Allergen Reactions  ? Beef-Derived Products   ? Fish-Derived Products   ?  Does not eat fish  ? Pork-Derived Products Other (See Comments)  ?  Cultural preference ?   ? ? ?Current Outpatient Medications  ?Medication Sig Dispense Refill  ? acetaminophen (TYLENOL) 325 MG tablet Take 1-2 tablets (325-650 mg total) by mouth every 4 (four) hours as needed for mild pain.    ? amLODipine (NORVASC) 5 MG tablet Take 1 tablet (5 mg total) by mouth daily. 90 tablet 1  ? aspirin 81 MG EC tablet Take 1 tablet (81 mg total) by mouth daily. Swallow whole. 90 tablet 1  ? citalopram (CELEXA) 20 MG tablet Take 1 tablet (20 mg total) by mouth daily. 90 tablet 1  ? hydrocortisone-pramoxine (ANALPRAM HC) 2.5-1 % rectal cream Place 1 application rectally 3 (three) times daily. 30 g 1  ? lidocaine (LIDODERM) 5 % Place 1 patch onto the skin daily. Remove & Discard patch within 12 hours or as directed by MD 30 patch 0  ? meclizine (ANTIVERT) 25 MG tablet Take 1 tablet (25 mg total) by mouth 2 (two) times daily as needed for dizziness. 60 tablet 1  ? meloxicam (MOBIC) 7.5 MG tablet Take 1 tablet (7.5 mg total) by mouth daily. 90 tablet 1  ? omeprazole (PRILOSEC) 40 MG capsule Take 1 capsule (40 mg total) by mouth daily. 90 capsule 1  ? polyethylene glycol powder (GLYCOLAX/MIRALAX) 17 GM/SCOOP powder Take 17 grams by mouth daily. 3350 g 1  ? rosuvastatin (CRESTOR) 20 MG tablet Take 1 tablet (20 mg total) by mouth daily. 90 tablet 1  ? tamsulosin (FLOMAX) 0.4 MG CAPS capsule Take 1 capsule (0.4 mg total) by mouth daily. 90 capsule 1  ? traZODone (DESYREL) 100 MG tablet Take 1 tablet (100 mg total) by mouth at bedtime. 90 tablet 1  ? vitamin B-12 (CYANOCOBALAMIN) 1000 MCG tablet Take 1 tablet (1,000 mcg total) by mouth daily. 90 tablet 1  ? ?No current facility-administered medications for this visit.  ? ? ?Review of Systems ? ?Constitutional: positive for fatigue ?Eyes: negative ?Ears, nose, mouth, throat, and face: negative ?Respiratory: negative ?Cardiovascular: negative ?Gastrointestinal: negative ?Genitourinary:negative ?Integument/breast: negative ?Hematologic/lymphatic:  negative ?Musculoskeletal:negative ?Neurological: positive for weakness ?Behavioral/Psych: negative ?Endocrine: negative ?Allergic/Immunologic: negative ? ?Physical Exam ? ?RNH:AFBXU, healthy, no distress, well nourished, and well developed ?SKIN: skin color, texture, turgor are normal, no rashes or significant lesions ?HEAD: Normocephalic, No masses, lesions, tenderness or abnormalities ?EYES: normal, PERRLA, Conjunctiva are pink and non-injected ?EARS: External ears normal, Canals clear ?OROPHARYNX:no exudate, no erythema, and lips, buccal mucosa, and tongue normal  ?NECK: supple, no adenopathy, no JVD ?LYMPH:  no palpable lymphadenopathy, no hepatosplenomegaly ?LUNGS: clear to auscultation , and palpation ?HEART: regular rate & rhythm, no murmurs, and no gallops ?ABDOMEN:abdomen soft, non-tender, normal bowel sounds, and no masses or organomegaly ?BACK: Back symmetric, no curvature., No CVA tenderness ?EXTREMITIES:no joint deformities, effusion, or inflammation, no edema  ?NEURO: 3/5 muscle strength in the right upper extremity ? ?PERFORMANCE STATUS: ECOG 1 ? ?LABORATORY DATA: ?Lab  Results  ?Component Value Date  ? WBC 6.6 06/25/2021  ? HGB 16.4 06/25/2021  ? HCT 48.1 06/25/2021  ? MCV 81.0 06/25/2021  ? PLT 256 06/25/2021  ? ? ?  Chemistry   ?   ?Component Value Date/Time  ? NA 139 05/19/2021 0825  ? NA 139 06/26/2020 0944  ? K 3.8 05/19/2021 0825  ? CL 105 05/19/2021 0825  ? CO2 27 05/19/2021 0825  ? BUN 11 05/19/2021 0825  ? BUN 19 06/26/2020 0944  ? CREATININE 0.98 05/19/2021 0825  ? CREATININE 0.92 04/28/2017 1231  ? CREATININE 1.02 12/05/2015 1109  ?    ?Component Value Date/Time  ? CALCIUM 9.3 05/19/2021 0825  ? ALKPHOS 102 04/30/2021 0535  ? AST 18 04/30/2021 0535  ? AST 20 04/28/2017 1231  ? ALT 13 04/30/2021 0535  ? ALT 22 04/28/2017 1231  ? BILITOT 2.2 (H) 04/30/2021 0535  ? BILITOT 1.9 (H) 06/26/2020 0944  ? BILITOT 1.4 (H) 04/28/2017 1231  ?  ? ? ? ?RADIOGRAPHIC STUDIES: ?No results  found. ? ?ASSESSMENT: This is a very pleasant 66 years old [place] male with history of polycythemia of unclear etiology likely to be reactive in nature but the patient also has nonspecific findings on a bone marrow biopsy and aspirate that was per

## 2021-06-26 DIAGNOSIS — I2584 Coronary atherosclerosis due to calcified coronary lesion: Secondary | ICD-10-CM | POA: Diagnosis not present

## 2021-06-26 DIAGNOSIS — G319 Degenerative disease of nervous system, unspecified: Secondary | ICD-10-CM | POA: Diagnosis not present

## 2021-06-26 DIAGNOSIS — I358 Other nonrheumatic aortic valve disorders: Secondary | ICD-10-CM | POA: Diagnosis not present

## 2021-06-26 DIAGNOSIS — I2510 Atherosclerotic heart disease of native coronary artery without angina pectoris: Secondary | ICD-10-CM | POA: Diagnosis not present

## 2021-06-26 DIAGNOSIS — I10 Essential (primary) hypertension: Secondary | ICD-10-CM | POA: Diagnosis not present

## 2021-06-26 DIAGNOSIS — I69351 Hemiplegia and hemiparesis following cerebral infarction affecting right dominant side: Secondary | ICD-10-CM | POA: Diagnosis not present

## 2021-06-27 ENCOUNTER — Ambulatory Visit (HOSPITAL_COMMUNITY)
Admission: RE | Admit: 2021-06-27 | Discharge: 2021-06-27 | Disposition: A | Discharge status: Home / Self Care | Payer: Medicare Other | Source: Ambulatory Visit | Attending: Radiology | Admitting: Radiology

## 2021-06-27 ENCOUNTER — Telehealth: Payer: Self-pay | Admitting: Family Medicine

## 2021-06-27 ENCOUNTER — Telehealth (HOSPITAL_COMMUNITY): Payer: Self-pay | Admitting: Radiology

## 2021-06-27 DIAGNOSIS — I69351 Hemiplegia and hemiparesis following cerebral infarction affecting right dominant side: Secondary | ICD-10-CM | POA: Diagnosis not present

## 2021-06-27 DIAGNOSIS — I358 Other nonrheumatic aortic valve disorders: Secondary | ICD-10-CM | POA: Diagnosis not present

## 2021-06-27 DIAGNOSIS — G319 Degenerative disease of nervous system, unspecified: Secondary | ICD-10-CM | POA: Diagnosis not present

## 2021-06-27 DIAGNOSIS — I639 Cerebral infarction, unspecified: Secondary | ICD-10-CM

## 2021-06-27 DIAGNOSIS — I2584 Coronary atherosclerosis due to calcified coronary lesion: Secondary | ICD-10-CM | POA: Diagnosis not present

## 2021-06-27 DIAGNOSIS — I2510 Atherosclerotic heart disease of native coronary artery without angina pectoris: Secondary | ICD-10-CM | POA: Diagnosis not present

## 2021-06-27 DIAGNOSIS — I10 Essential (primary) hypertension: Secondary | ICD-10-CM | POA: Diagnosis not present

## 2021-06-27 DIAGNOSIS — I671 Cerebral aneurysm, nonruptured: Secondary | ICD-10-CM | POA: Diagnosis not present

## 2021-06-27 NOTE — Telephone Encounter (Signed)
Copied from [place] [id number]. Topic: General - Inquiry >> Jun 27, 2021 11:01 AM Oneta Rack wrote: 1 finger sublux to he's right shoulder

## 2021-06-27 NOTE — Telephone Encounter (Signed)
Called pt to see if he could come in at 12 pm for his consult with Deveshwar. He states he will try to get a ride and be there by 12 p but he is not sure if he can or not. If not, he will be here at 1 pm as originally scheduled. JM

## 2021-06-30 ENCOUNTER — Encounter: Payer: Self-pay | Admitting: Physical Medicine & Rehabilitation

## 2021-06-30 ENCOUNTER — Encounter: Payer: Medicare Other | Attending: Physical Medicine & Rehabilitation | Admitting: Physical Medicine & Rehabilitation

## 2021-06-30 ENCOUNTER — Other Ambulatory Visit: Payer: Self-pay

## 2021-06-30 VITALS — BP 115/76 | HR 77 | Ht 70.0 in | Wt 157.2 lb

## 2021-06-30 DIAGNOSIS — I2584 Coronary atherosclerosis due to calcified coronary lesion: Secondary | ICD-10-CM | POA: Diagnosis not present

## 2021-06-30 DIAGNOSIS — S43001A Unspecified subluxation of right shoulder joint, initial encounter: Secondary | ICD-10-CM | POA: Insufficient documentation

## 2021-06-30 DIAGNOSIS — M25511 Pain in right shoulder: Secondary | ICD-10-CM | POA: Diagnosis not present

## 2021-06-30 DIAGNOSIS — G319 Degenerative disease of nervous system, unspecified: Secondary | ICD-10-CM | POA: Diagnosis not present

## 2021-06-30 DIAGNOSIS — I63312 Cerebral infarction due to thrombosis of left middle cerebral artery: Secondary | ICD-10-CM | POA: Diagnosis not present

## 2021-06-30 DIAGNOSIS — I10 Essential (primary) hypertension: Secondary | ICD-10-CM | POA: Diagnosis not present

## 2021-06-30 DIAGNOSIS — I2510 Atherosclerotic heart disease of native coronary artery without angina pectoris: Secondary | ICD-10-CM | POA: Diagnosis not present

## 2021-06-30 DIAGNOSIS — I69351 Hemiplegia and hemiparesis following cerebral infarction affecting right dominant side: Secondary | ICD-10-CM | POA: Diagnosis not present

## 2021-06-30 DIAGNOSIS — I358 Other nonrheumatic aortic valve disorders: Secondary | ICD-10-CM | POA: Diagnosis not present

## 2021-06-30 MED ORDER — GABAPENTIN 300 MG PO CAPS
300.0000 mg | ORAL_CAPSULE | Freq: Three times a day (TID) | ORAL | 2 refills | Status: DC
  Filled 2021-06-30: qty 90, 30d supply, fill #0

## 2021-06-30 NOTE — Progress Notes (Unsigned)
Subjective:    Patient ID: Aaron Dennis, male    DOB: 1955/11/16, 66 y.o.   MRN: [id number]  HPI   Hospital HPI  Aaron Dennis is a 66 y.o. male who presented to the emergency department on 04/27/2021 with right-sided weakness over the past several days.  MRI revealed acute infarction of the left frontoparietal lobe.  His right arm remains flaccid and weak right lower extremity.  Aaron Dennis was admitted to rehab 04/29/2021 for inpatient therapies to consist of PT, ST and OT at least three hours five days a week. Past admission physiatrist, therapy team and rehab RN have worked together to provide customized collaborative inpatient rehab. Constipated on admission and treated with Miralax. Follow-up chemistries showed K+ of 3.4. 40 mEq given times one on 3/22. Required ongoing repletion with 10 mEq daily. Endorsed depressed mood since having stroke and Celexa started on 3/23.  Right shoulder pain due to subluxation much improved with K taping and myofascial release therapy. Potassium stable at 3.7. Regular bowel movements. Low dose trazodone started 3/28 for insomnia. Constipation resolved. Improved sleep hygiene with trazodone 100 mg at bedtime without complication of urinary retention. Cock up splint applied to right wrist. Trigger point injection on 4/11 for right upper trap tightness. Completed three weeks of DAPT, therefore Plavix discontinued 4/10 and will remain on aspirin 81 mg daily.    HPI 67 year old male with past medical history of polycythemia and coronary artery disease who is here for follow-up after a left frontal parietal infarct.  Patient reports he has been doing overall well since his discharge from the hospital.  He has followed up with his primary care physician and has all his necessary medications.  He is working with home PT and OT.  He has all of his follow-up appointment scheduled.  Denies bowel or bladder issues.  He continues to have pain in his right shoulder.  The  shoulder feels sore and hurts with movement.  He has not noticed any redness or swelling in his arms.     Pain Inventory Average Pain 6 Pain Right Now 4 My pain is tingling  LOCATION OF PAIN  shoulder hand and knee  BOWEL Number of stools per week: 1   BLADDER Normal   Mobility use a walker how many minutes can you walk? 5 ability to climb steps?  no do you drive?  no  Function disabled: date disabled . retired  Neuro/Psych trouble walking  Prior Studies Any changes since last visit?  no  Physicians involved in your care Any changes since last visit?  no   Family History  Problem Relation Age of Onset   Healthy Mother    Diabetes Brother    Colon polyps Neg Hx    Colon cancer Neg Hx    Esophageal cancer Neg Hx    Rectal cancer Neg Hx    Stomach cancer Neg Hx    Social History   Socioeconomic History   Marital status: Married    Spouse name: Not on file   Number of children: Not on file   Years of education: Not on file   Highest education level: Not on file  Occupational History   Not on file  Tobacco Use   Smoking status: Former   Smokeless tobacco: Never   Tobacco comments:    quit Jan 2018  Vaping Use   Vaping Use: Never used  Substance and Sexual Activity   Alcohol use: No   Drug use:  No   Sexual activity: Not Currently  Other Topics Concern   Not on file  Social History Narrative   Not on file   Social Determinants of Health   Financial Resource Strain: Not on file  Food Insecurity: Not on file  Transportation Needs: Not on file  Physical Activity: Not on file  Stress: Not on file  Social Connections: Not on file   No past surgical history on file. Past Medical History:  Diagnosis Date   GERD (gastroesophageal reflux disease)    Hypertension    Lung nodule    7 mm LUL nodule   BP 115/76   Pulse 77   Ht '5\' 10"'$  (1.778 m)   Wt 157 lb 3.2 oz (71.3 kg)   SpO2 98%   BMI 22.56 kg/m   Opioid Risk Score:   Fall Risk  Score:  `1  Depression screen [facility] 2/9     06/30/2021   11:26 AM 06/11/2021   10:21 AM 08/14/2020    2:48 PM 06/25/2020   10:17 AM 11/29/2018    2:05 PM 05/17/2014    2:51 PM 07/22/2012   12:57 PM  Depression screen PHQ 2/9  Decreased Interest 1 1 0 0 0 0 0  Down, Depressed, Hopeless 2 1 0 1 0 0 0  PHQ - 2 Score 3 2 0 1 0 0 0  Altered sleeping 2 1 0 0 0    Tired, decreased energy 2 0 0 0 0    Change in appetite 2 0 0 0 0    Feeling bad or failure about yourself  '2 1 1 '$ 0 0    Trouble concentrating '2 1 1 '$ 0 0    Moving slowly or fidgety/restless '3 2 1 '$ 0 0    Suicidal thoughts 0 0 0 0 0    PHQ-9 Score '16 7 3 1 '$ 0       Review of Systems  Constitutional: Negative.   HENT: Negative.    Respiratory: Negative.    Cardiovascular: Negative.   Gastrointestinal:  Positive for constipation.  Endocrine: Negative.   Genitourinary: Negative.   Musculoskeletal:  Positive for gait problem.  Skin: Negative.   Allergic/Immunologic: Negative.   Neurological:        Tingling  Hematological: Negative.   Psychiatric/Behavioral:  Positive for dysphoric mood.   All other systems reviewed and are negative.     Objective:   Physical Exam  Today's Vitals   06/30/21 1113  BP: 115/76  Pulse: 77  SpO2: 98%  Weight: 71.3 kg  Height: '5\' 10"'$  (1.778 m)   Body mass index is 22.56 kg/m.   Gen: no distress, normal appearing HEENT: oral mucosa pink and moist, NCAT Cardio: Reg rate Chest: normal effort, normal rate of breathing Abd: soft, non-distended Ext: no edema Psych: pleasant, flat affect Skin: intact Neuro: Alert and oriented follows commands answers questions.  His son reports his speech is about baseline.  Language overall intact.  Sensation to light touch intact in all 4 extremities.  DTR with increased reflexes at the right patella, Achilles, BR triceps. Musculoskeletal: Strength 5 out of 5 in left upper extremity and left lower extremity Right upper extremity shoulder abduction 2 out of  5 able to Abduct to about 45 degrees.  Elbow flexion and extension 2-3 out of 5.  Wrist extension 1 out of 5.  Finger flexion 2 out of 5.  Finger abduction 1 out of 5 Right lower extremity 4 out of 5 throughout. Right  shoulder subluxation at the glenohumeral joint Right shoulder tenderness to palpation anterior and posterior Patient reports pain with Abduction, internal and external rotation of the right shoulder Neer's and Hawkins equivocal He walks with a hemiwalker with decreased step length on the right       Assessment & Plan:   Left frontal parietal infarct -Patient appears to have had continued recovery and strength since his stay at [facility] -Continue home therapy with PT OT -Provided patient with handicap parking form  Right shoulder pain with right shoulder subluxation -We will order a sling for use when ambulating to prevent injury -Offered glenohumeral injection for the right shoulder.  Patient declines -Advised to continue working with PT OT advised to ask about e-stim options with home therapy -We will order gabapentin for help with pain control.  Advised to start with 300 mg at bedtime for a week.  Then may increase to twice daily for a week and then 3 times daily for a week -Continue tylenol PRN  Polycythemia -Continue follow up with medical oncology

## 2021-06-30 NOTE — Telephone Encounter (Signed)
FYI

## 2021-07-01 DIAGNOSIS — I10 Essential (primary) hypertension: Secondary | ICD-10-CM | POA: Diagnosis not present

## 2021-07-01 DIAGNOSIS — I2510 Atherosclerotic heart disease of native coronary artery without angina pectoris: Secondary | ICD-10-CM | POA: Diagnosis not present

## 2021-07-01 DIAGNOSIS — I358 Other nonrheumatic aortic valve disorders: Secondary | ICD-10-CM | POA: Diagnosis not present

## 2021-07-01 DIAGNOSIS — I2584 Coronary atherosclerosis due to calcified coronary lesion: Secondary | ICD-10-CM | POA: Diagnosis not present

## 2021-07-01 DIAGNOSIS — I69351 Hemiplegia and hemiparesis following cerebral infarction affecting right dominant side: Secondary | ICD-10-CM | POA: Diagnosis not present

## 2021-07-01 DIAGNOSIS — G319 Degenerative disease of nervous system, unspecified: Secondary | ICD-10-CM | POA: Diagnosis not present

## 2021-07-01 LAB — JAK2 (INCLUDING V617F AND EXON 12), MPL,& CALR-NEXT GEN SEQ

## 2021-07-01 NOTE — Progress Notes (Unsigned)
[facility] [address] [place]. [place] [address] [phone]       HOSPITAL FOLLOW UP NOTE  [name] Date of Birth:  [birth year] Medical Record Number:  [id number]   Reason for Referral:  hospital stroke follow up    SUBJECTIVE:   CHIEF COMPLAINT:  No chief complaint on file.   HPI:   Aaron Dennis is a 66 y.o. who  has a past medical history of GERD (gastroesophageal reflux disease), Hypertension, and Lung nodule.  Patient presented on 04/27/2021 with fie days of waxing and waning right sided weakness without other deficits. CT negative. MR brain showed acute left frontoparietal infarct with mild associated edema without mass affect with findings consistent with chronic microvascular disease. CTA showed moderate stenosis of distal L intradural vertebral artery with 4x90m aneurysm, low risk for rupture. He was started on Plavix and asa 85mx 3 weeks then Plavix alone. During hospitalization he lost motor function of right upper ext, RLE remained at 3/5 strength. [facility]. Personally reviewed hospitalization pertinent progress notes, lab work and imaging.  Evaluated by Dr XuErlinda Hong  He was admitted to rehab 3/21. He underwent extensive therapies 5 days a week. He had improvement in right sided weakness and ability to compensate for deficits and discharged home 05/20/2021. Plavix was discontinued 05/19/2021. He continues asa 8138mSince being home, he has continued home PT/OT. He is able to walk with walker. His brother has helped take care of him.   He was seen by PCP 06/11/2021. BP was controlled on Norvasc 5mg67me was swithced in hospital from Lipitor 10mg25mCrestor 20mg.51mht sided weakness improving but reported right shoulder pain. Meloxicam 7.5mg da60m prescribed. Celexa and trazodone continued for depression and sleep. He was referred to IR for monitoring of aneurysm. Referral to neurology as well for stroke follow up.   He  was seen in follow up with Dr ShtrideCurlene Dolphintient rehab 06/30/2021. He was offered right shoulder injection for pain with subluxation but declined. He was started on gabapentin 300mg ti1ming to TID dosing.  He is followed by hem/onc for polycythemia. He is planning to repeat bone marrow biopsy and lab testing due to previous concerns of skeletal lesions concerning for myeloproliferative disease.    PERTINENT IMAGING/LABS  Code Stroke CT head: No acute abnormality. Small vessel disease. Atrophy.  CTA head & neck: No large vessel occlusion.  Moderate stenosis of the distal left intradural vertebral artery, mild basilar artery and proximal left P2 PCA.  MRI: Acute infarct in the left frontoparietal white matter, mild associated edema without mass effect.  Additional scattered patchy T2/FLAIR hyperintensities within the white matter.  Small foci of encephalomalacia in the periatrial white matter bilaterally.  Cerebral atrophy no evidence of acute hemorrhage, mass lesion, midline shift or extra-axial fluid collection.  2D Echo: EF 60-65%, G1DD VAS US LEV: [place] evidence of DVT    A1C Lab Results  Component Value Date   HGBA1C 4.9 04/27/2021    Lipid Panel     Component Value Date/Time   CHOL 159 04/27/2021 1649   CHOL 198 06/26/2020 0944   TRIG 52 04/27/2021 1649   HDL 29 (L) 04/27/2021 1649   HDL 38 (L) 06/26/2020 0944   CHOLHDL 5.5 04/27/2021 1649   VLDL 10 04/27/2021 1649   LDLCALC 120 (H) 04/27/2021 1649   LDLCALC 147 (H) 06/26/2020 0944   LABVLDL 13 06/26/2020 0944      ROS:  14 system review of systems performed and negative with exception of those listed in HPI  PMH:  Past Medical History:  Diagnosis Date   GERD (gastroesophageal reflux disease)    Hypertension    Lung nodule    7 mm LUL nodule    PSH: No past surgical history on file.  Social History:  Social History   Socioeconomic History   Marital status: Married    Spouse name: Not on file   Number  of children: Not on file   Years of education: Not on file   Highest education level: Not on file  Occupational History   Not on file  Tobacco Use   Smoking status: Former   Smokeless tobacco: Never   Tobacco comments:    quit Jan 2018  Vaping Use   Vaping Use: Never used  Substance and Sexual Activity   Alcohol use: No   Drug use: No   Sexual activity: Not Currently  Other Topics Concern   Not on file  Social History Narrative   Not on file   Social Determinants of Health   Financial Resource Strain: Not on file  Food Insecurity: Not on file  Transportation Needs: Not on file  Physical Activity: Not on file  Stress: Not on file  Social Connections: Not on file  Intimate Partner Violence: Not on file    Family History:  Family History  Problem Relation Age of Onset   Healthy Mother    Diabetes Brother    Colon polyps Neg Hx    Colon cancer Neg Hx    Esophageal cancer Neg Hx    Rectal cancer Neg Hx    Stomach cancer Neg Hx     Medications:   Current Outpatient Medications on File Prior to Visit  Medication Sig Dispense Refill   acetaminophen (TYLENOL) 325 MG tablet Take 1-2 tablets (325-650 mg total) by mouth every 4 (four) hours as needed for mild pain.     amLODipine (NORVASC) 5 MG tablet Take 1 tablet (5 mg total) by mouth daily. 90 tablet 1   aspirin 81 MG EC tablet Take 1 tablet (81 mg total) by mouth daily. Swallow whole. 90 tablet 1   citalopram (CELEXA) 20 MG tablet Take 1 tablet (20 mg total) by mouth daily. 90 tablet 1   gabapentin (NEURONTIN) 300 MG capsule Take 1 capsule (300 mg total) by mouth 3 (three) times daily. 90 capsule 2   omeprazole (PRILOSEC) 40 MG capsule Take 1 capsule (40 mg total) by mouth daily. 90 capsule 1   polyethylene glycol powder (GLYCOLAX/MIRALAX) 17 GM/SCOOP powder Take 17 grams by mouth daily. 3350 g 1   rosuvastatin (CRESTOR) 20 MG tablet Take 1 tablet (20 mg total) by mouth daily. 90 tablet 1   tamsulosin (FLOMAX) 0.4 MG  CAPS capsule Take 1 capsule (0.4 mg total) by mouth daily. 90 capsule 1   vitamin B-12 (CYANOCOBALAMIN) 1000 MCG tablet Take 1 tablet (1,000 mcg total) by mouth daily. (Patient not taking: Reported on 06/30/2021) 90 tablet 1   No current facility-administered medications on file prior to visit.    Allergies:   Allergies  Allergen Reactions   Beef-Derived Products    Fish-Derived Products     Does not eat fish   Pork-Derived Products Other (See Comments)    Cultural preference       OBJECTIVE:  Physical Exam  There were no vitals filed for this visit. There is no height or weight on file to calculate  BMI. No results found.     06/30/2021   11:26 AM  Depression screen PHQ 2/9  Decreased Interest 1  Down, Depressed, Hopeless 2  PHQ - 2 Score 3  Altered sleeping 2  Tired, decreased energy 2  Change in appetite 2  Feeling bad or failure about yourself  2  Trouble concentrating 2  Moving slowly or fidgety/restless 3  Suicidal thoughts 0  PHQ-9 Score 16     General: well developed, well nourished, seated, in no evident distress Head: head normocephalic and atraumatic.   Neck: supple with no carotid or supraclavicular bruits Cardiovascular: regular rate and rhythm, no murmurs Musculoskeletal: no deformity Skin:  no rash/petichiae Vascular:  Normal pulses all extremities   Neurologic Exam Mental Status: Awake and fully alert.  Fluent speech and language.  Oriented to place and time. Recent and remote memory intact. Attention span, concentration and fund of knowledge appropriate. Mood and affect appropriate.  Cranial Nerves: Fundoscopic exam reveals sharp disc margins. Pupils equal, briskly reactive to light. Extraocular movements full without nystagmus. Visual fields full to confrontation. Hearing intact. Facial sensation intact. Face, tongue, palate moves normally and symmetrically.  Motor: Normal bulk and tone. Normal strength in all tested extremity muscles Sensory.:  intact to touch , pinprick , position and vibratory sensation.  Coordination: Rapid alternating movements normal in all extremities. Finger-to-nose and heel-to-shin performed accurately bilaterally. Gait and Station: Arises from chair without difficulty. Stance is normal. Gait demonstrates normal stride length and balance with ***. Tandem walk and heel toe ***.  Reflexes: 1+ and symmetric.    NIHSS  *** Modified Rankin  ***    ASSESSMENT: Aaron Dennis is a 66 y.o. year old male presenting to the ER 3/19 with 5 day history of waxing and waning right upper extremity weakness. Vascular risk factors include HTN, HLD, advanced age, former smoker, CAD, polycythemia.     PLAN:  Stroke with capsular warning syndrome, left CR infarct, likely due to to small vessel disease.  : Residual deficit: right upper and lower extremity weakness. Continue aspirin 81 mg daily  and rosuvastatin 61m daily for secondary stroke prevention.  Discussed secondary stroke prevention measures and importance of close PCP follow up for aggressive stroke risk factor management. I have gone over the pathophysiology of stroke, warning signs and symptoms, risk factors and their management in some detail with instructions to go to the closest emergency room for symptoms of concern. HTN: BP goal <130/90.  Stable on amlodipine 537mdaily per PCP. Dash diet and regular exercise advised.  HLD: LDL goal <70. Recent LDL 120. Continue rosuvastatin 2039mnd lipid monitoring per PCP.  DMII: A1c goal<7.0. Recent A1c 4.9. Continue healthy, well balanced diet and regular exercise.  Cerebral aneurysm: follow up with Dr DevEstanislado Pandytpatient Polycythemia: continue close follow up with hematology   Follow up in *** or call earlier if needed   CC:  GNAVernonovider: Dr. SetLeonie ManP: NewCharlott RakesD    I spent *** minutes of face-to-face and non-face-to-face time with patient.  This included previsit chart review including review of recent  hospitalization, lab review, study review, order entry, electronic health record documentation, patient education regarding recent stroke including etiology, secondary stroke prevention measures and importance of managing stroke risk factors, residual deficits and typical recovery time and answered all other questions to patient satisfaction   AmyDebbora PrestoNPSsm Health St. Clare HospitaluiAri[facility] [address] DubuqueeMormon LakeC [id number] [id number] [phone]: This document  was prepared with digital dictation and possible smart phrase technology. Any transcriptional errors that result from this process are unintentional.

## 2021-07-01 NOTE — Patient Instructions (Signed)
Below is our plan:  Stroke with capsular warning syndrome, left CR infarct, likely due to to small vessel disease.  : Residual deficit: right upper and lower extremity weakness. Continue aspirin 81 mg daily  and rosuvastatin '20mg'$  daily for secondary stroke prevention.  Discussed secondary stroke prevention measures and importance of close PCP follow up for aggressive stroke risk factor management. I have gone over the pathophysiology of stroke, warning signs and symptoms, risk factors and their management in some detail with instructions to go to the closest emergency room for symptoms of concern. HTN: BP goal <130/90.  Stable on amlodipine '5mg'$  daily per PCP. Dash diet and regular exercise advised.  HLD: LDL goal <70. Recent LDL 120. Continue rosuvastatin '20mg'$  and lipid monitoring per PCP.  DMII: A1c goal<7.0. Recent A1c 4.9. Continue healthy, well balanced diet and regular exercise.  Cerebral aneurysm: follow up with Dr Estanislado Pandy outpatient Polycythemia: continue close follow up with hematology  Please make sure you are staying well hydrated. I recommend 50-60 ounces daily. Well balanced diet and regular exercise encouraged. Consistent sleep schedule with 6-8 hours recommended.   Please continue follow up with care team as directed.   Follow up with me in 3-4 months   You may receive a survey regarding today's visit. I encourage you to leave honest feed back as I do use this information to improve patient care. Thank you for seeing me today!

## 2021-07-02 ENCOUNTER — Encounter: Payer: Self-pay | Admitting: Family Medicine

## 2021-07-02 ENCOUNTER — Other Ambulatory Visit: Payer: Self-pay

## 2021-07-02 ENCOUNTER — Ambulatory Visit (INDEPENDENT_AMBULATORY_CARE_PROVIDER_SITE_OTHER): Payer: Medicare Other | Admitting: Family Medicine

## 2021-07-02 VITALS — BP 113/75 | HR 78 | Ht 70.0 in | Wt 157.0 lb

## 2021-07-02 DIAGNOSIS — I639 Cerebral infarction, unspecified: Secondary | ICD-10-CM | POA: Diagnosis not present

## 2021-07-02 HISTORY — PX: IR RADIOLOGIST EVAL & MGMT: IMG5224

## 2021-07-02 NOTE — Telephone Encounter (Signed)
Phone call placed to [facility] home OT from [place] regarding the phone message from several days ago.  She states that patient has a finger length subluxation of the right shoulder due to weakness on his right side from recent stroke.  They will be getting a sling for him.  They are working with trying to improve his strength in the right upper extremity and he is getting better.  Pain on the right side fluctuates.  She recommends waiting until he completes home PT/OT then she will touch base to let us know how he is doing and whether he needs referral to orthopedics.

## 2021-07-02 NOTE — Progress Notes (Signed)
I agree with the above plan 

## 2021-07-04 DIAGNOSIS — I358 Other nonrheumatic aortic valve disorders: Secondary | ICD-10-CM | POA: Diagnosis not present

## 2021-07-04 DIAGNOSIS — I69351 Hemiplegia and hemiparesis following cerebral infarction affecting right dominant side: Secondary | ICD-10-CM | POA: Diagnosis not present

## 2021-07-04 DIAGNOSIS — I2584 Coronary atherosclerosis due to calcified coronary lesion: Secondary | ICD-10-CM | POA: Diagnosis not present

## 2021-07-04 DIAGNOSIS — I10 Essential (primary) hypertension: Secondary | ICD-10-CM | POA: Diagnosis not present

## 2021-07-04 DIAGNOSIS — I2510 Atherosclerotic heart disease of native coronary artery without angina pectoris: Secondary | ICD-10-CM | POA: Diagnosis not present

## 2021-07-04 DIAGNOSIS — G319 Degenerative disease of nervous system, unspecified: Secondary | ICD-10-CM | POA: Diagnosis not present

## 2021-07-08 DIAGNOSIS — I2584 Coronary atherosclerosis due to calcified coronary lesion: Secondary | ICD-10-CM | POA: Diagnosis not present

## 2021-07-08 DIAGNOSIS — I2510 Atherosclerotic heart disease of native coronary artery without angina pectoris: Secondary | ICD-10-CM | POA: Diagnosis not present

## 2021-07-08 DIAGNOSIS — I10 Essential (primary) hypertension: Secondary | ICD-10-CM | POA: Diagnosis not present

## 2021-07-08 DIAGNOSIS — G319 Degenerative disease of nervous system, unspecified: Secondary | ICD-10-CM | POA: Diagnosis not present

## 2021-07-08 DIAGNOSIS — I358 Other nonrheumatic aortic valve disorders: Secondary | ICD-10-CM | POA: Diagnosis not present

## 2021-07-08 DIAGNOSIS — I69351 Hemiplegia and hemiparesis following cerebral infarction affecting right dominant side: Secondary | ICD-10-CM | POA: Diagnosis not present

## 2021-07-09 ENCOUNTER — Telehealth: Payer: Self-pay | Admitting: Family Medicine

## 2021-07-09 DIAGNOSIS — I358 Other nonrheumatic aortic valve disorders: Secondary | ICD-10-CM | POA: Diagnosis not present

## 2021-07-09 DIAGNOSIS — I69351 Hemiplegia and hemiparesis following cerebral infarction affecting right dominant side: Secondary | ICD-10-CM | POA: Diagnosis not present

## 2021-07-09 DIAGNOSIS — I10 Essential (primary) hypertension: Secondary | ICD-10-CM | POA: Diagnosis not present

## 2021-07-09 DIAGNOSIS — I2510 Atherosclerotic heart disease of native coronary artery without angina pectoris: Secondary | ICD-10-CM | POA: Diagnosis not present

## 2021-07-09 DIAGNOSIS — I2584 Coronary atherosclerosis due to calcified coronary lesion: Secondary | ICD-10-CM | POA: Diagnosis not present

## 2021-07-09 DIAGNOSIS — G319 Degenerative disease of nervous system, unspecified: Secondary | ICD-10-CM | POA: Diagnosis not present

## 2021-07-09 LAB — HEMOCHROMATOSIS DNA-PCR(C282Y,H63D)

## 2021-07-09 LAB — ERYTHROPOIETIN: Erythropoietin: 12.9 m[IU]/mL (ref 2.6–18.5)

## 2021-07-09 NOTE — Telephone Encounter (Signed)
FYi

## 2021-07-09 NOTE — Telephone Encounter (Signed)
Copied from [place] [id number]. Topic: General - Other >> Jul 09, 2021  2:41 PM Loma [place] wrote: Reason for CRM: [facility], Ailene Ravel has called stating pt fell yesterday and she has examined him and looks to be no injuries, did not hit head. Calling in to report per protocol. FU if needed [phone]

## 2021-07-10 DIAGNOSIS — M549 Dorsalgia, unspecified: Secondary | ICD-10-CM | POA: Diagnosis not present

## 2021-07-10 DIAGNOSIS — I639 Cerebral infarction, unspecified: Secondary | ICD-10-CM | POA: Diagnosis not present

## 2021-07-10 DIAGNOSIS — Q782 Osteopetrosis: Secondary | ICD-10-CM | POA: Diagnosis not present

## 2021-07-10 DIAGNOSIS — M25561 Pain in right knee: Secondary | ICD-10-CM | POA: Diagnosis not present

## 2021-07-10 DIAGNOSIS — R531 Weakness: Secondary | ICD-10-CM | POA: Diagnosis not present

## 2021-07-11 ENCOUNTER — Other Ambulatory Visit: Payer: Self-pay | Admitting: Radiology

## 2021-07-11 ENCOUNTER — Telehealth: Payer: Self-pay | Admitting: Internal Medicine

## 2021-07-11 DIAGNOSIS — I10 Essential (primary) hypertension: Secondary | ICD-10-CM | POA: Diagnosis not present

## 2021-07-11 DIAGNOSIS — G319 Degenerative disease of nervous system, unspecified: Secondary | ICD-10-CM | POA: Diagnosis not present

## 2021-07-11 DIAGNOSIS — I358 Other nonrheumatic aortic valve disorders: Secondary | ICD-10-CM | POA: Diagnosis not present

## 2021-07-11 DIAGNOSIS — M899 Disorder of bone, unspecified: Secondary | ICD-10-CM

## 2021-07-11 DIAGNOSIS — I2584 Coronary atherosclerosis due to calcified coronary lesion: Secondary | ICD-10-CM | POA: Diagnosis not present

## 2021-07-11 DIAGNOSIS — I69351 Hemiplegia and hemiparesis following cerebral infarction affecting right dominant side: Secondary | ICD-10-CM | POA: Diagnosis not present

## 2021-07-11 DIAGNOSIS — I2510 Atherosclerotic heart disease of native coronary artery without angina pectoris: Secondary | ICD-10-CM | POA: Diagnosis not present

## 2021-07-11 NOTE — Telephone Encounter (Signed)
Called patient regarding upcoming appointments, patient Is notified. ?

## 2021-07-14 ENCOUNTER — Ambulatory Visit (HOSPITAL_COMMUNITY)
Admission: RE | Admit: 2021-07-14 | Discharge: 2021-07-14 | Disposition: A | Discharge status: Home / Self Care | Payer: Medicare Other | Source: Ambulatory Visit | Attending: Internal Medicine | Admitting: Internal Medicine

## 2021-07-14 ENCOUNTER — Encounter (HOSPITAL_COMMUNITY): Payer: Self-pay

## 2021-07-14 ENCOUNTER — Telehealth: Payer: Self-pay | Admitting: Family Medicine

## 2021-07-14 DIAGNOSIS — D751 Secondary polycythemia: Secondary | ICD-10-CM | POA: Insufficient documentation

## 2021-07-14 DIAGNOSIS — M899 Disorder of bone, unspecified: Secondary | ICD-10-CM | POA: Insufficient documentation

## 2021-07-14 DIAGNOSIS — R911 Solitary pulmonary nodule: Secondary | ICD-10-CM | POA: Diagnosis not present

## 2021-07-14 DIAGNOSIS — D45 Polycythemia vera: Secondary | ICD-10-CM | POA: Diagnosis not present

## 2021-07-14 DIAGNOSIS — M545 Low back pain, unspecified: Secondary | ICD-10-CM | POA: Insufficient documentation

## 2021-07-14 DIAGNOSIS — D471 Chronic myeloproliferative disease: Secondary | ICD-10-CM | POA: Diagnosis not present

## 2021-07-14 DIAGNOSIS — I728 Aneurysm of other specified arteries: Secondary | ICD-10-CM | POA: Insufficient documentation

## 2021-07-14 DIAGNOSIS — K219 Gastro-esophageal reflux disease without esophagitis: Secondary | ICD-10-CM | POA: Insufficient documentation

## 2021-07-14 DIAGNOSIS — I639 Cerebral infarction, unspecified: Secondary | ICD-10-CM

## 2021-07-14 DIAGNOSIS — Z8673 Personal history of transient ischemic attack (TIA), and cerebral infarction without residual deficits: Secondary | ICD-10-CM | POA: Diagnosis not present

## 2021-07-14 DIAGNOSIS — I10 Essential (primary) hypertension: Secondary | ICD-10-CM | POA: Diagnosis not present

## 2021-07-14 DIAGNOSIS — I726 Aneurysm of vertebral artery: Secondary | ICD-10-CM | POA: Diagnosis not present

## 2021-07-14 LAB — CBC WITH DIFFERENTIAL/PLATELET
Abs Immature Granulocytes: 0.02 10*3/uL (ref 0.00–0.07)
Basophils Absolute: 0 10*3/uL (ref 0.0–0.1)
Basophils Relative: 1 %
Eosinophils Absolute: 0 10*3/uL (ref 0.0–0.5)
Eosinophils Relative: 1 %
HCT: 46.3 % (ref 39.0–52.0)
Hemoglobin: 15.6 g/dL (ref 13.0–17.0)
Immature Granulocytes: 0 %
Lymphocytes Relative: 38 %
Lymphs Abs: 2.4 10*3/uL (ref 0.7–4.0)
MCH: 28.1 pg (ref 26.0–34.0)
MCHC: 33.7 g/dL (ref 30.0–36.0)
MCV: 83.3 fL (ref 80.0–100.0)
Monocytes Absolute: 0.6 10*3/uL (ref 0.1–1.0)
Monocytes Relative: 9 %
Neutro Abs: 3.4 10*3/uL (ref 1.7–7.7)
Neutrophils Relative %: 51 %
Platelets: 242 10*3/uL (ref 150–400)
RBC: 5.56 MIL/uL (ref 4.22–5.81)
RDW: 14.5 % (ref 11.5–15.5)
WBC: 6.5 10*3/uL (ref 4.0–10.5)
nRBC: 0 % (ref 0.0–0.2)

## 2021-07-14 MED ORDER — SODIUM CHLORIDE 0.9 % IV SOLN: INTRAVENOUS | Status: DC

## 2021-07-14 MED ORDER — FENTANYL CITRATE (PF) 100 MCG/2ML IJ SOLN
INTRAMUSCULAR | Status: AC
  Filled 2021-07-14: qty 2

## 2021-07-14 MED ORDER — MIDAZOLAM HCL 2 MG/2ML IJ SOLN
INTRAMUSCULAR | Status: AC
  Filled 2021-07-14: qty 4

## 2021-07-14 MED ORDER — FENTANYL CITRATE (PF) 100 MCG/2ML IJ SOLN
INTRAMUSCULAR | Status: AC | PRN
  Administered 2021-07-14 (×2): 50 ug via INTRAVENOUS

## 2021-07-14 MED ORDER — MIDAZOLAM HCL 2 MG/2ML IJ SOLN
INTRAMUSCULAR | Status: AC | PRN
  Administered 2021-07-14 (×2): 1 mg via INTRAVENOUS

## 2021-07-14 MED ORDER — LIDOCAINE HCL (PF) 1 % IJ SOLN
INTRAMUSCULAR | Status: AC | PRN
  Administered 2021-07-14: 20 mL

## 2021-07-14 NOTE — Procedures (Signed)
Vascular and Interventional Radiology Procedure Note  Patient: Aaron Dennis DOB: Dec 04, 1955 Medical Record Number: [id number] Note Date/Time: 07/14/21 9:21 AM   Performing Physician: Michaelle Birks, MD Assistant(s): None  Diagnosis: Polycythemia  Procedure: BONE MARROW ASPIRATION and BIOPSY  Anesthesia: Conscious Sedation Complications: None Estimated Blood Loss: Minimal Specimens: Sent for Pathology  Findings:  Successful CT-guided bone marrow aspiration and biopsy A total of 1 cores were obtained. Hemostasis of the tract was achieved using Manual Pressure.  Plan: Bed rest for 1 hours.  See detailed procedure note with images in PACS. The patient tolerated the procedure well without incident or complication and was returned to Short Stay in stable condition.    Michaelle Birks, MD [facility] Inter[facility] Radiology   Pager. [place]

## 2021-07-14 NOTE — H&P (Signed)
Referring Physician(s): Mohamed,Mohamed  Supervising Physician: Michaelle Birks  Patient Status:  Aaron Dennis  Chief Complaint:  "I'm having a bone marrow biopsy"  Subjective: Pt known to neurointerventional radiology service from consultation on 06/27/21 to discuss right vertebral artery aneurysm.  He has a past medical history significant for GERD, hypertension, lung nodule and recent stroke.  He also has polycythemia with lytic bone lesions.  He presents today for CT-guided bone marrow biopsy for further evaluation.  He currently denies fever, headache, chest pain, dyspnea, cough, abdominal pain, nausea, vomiting or bleeding.  He does have some right-sided residual weakness from recent stroke, right shoulder pain, occasional low back pain.  Past Medical History:  Diagnosis Date   GERD (gastroesophageal reflux disease)    Hypertension    Lung nodule    7 mm LUL nodule   Past Surgical History:  Procedure Laterality Date   IR RADIOLOGIST EVAL & MGMT  07/02/2021      Allergies: Beef-derived products, Fish-derived products, and Pork-derived products  Medications: Prior to Admission medications   Medication Sig Start Date End Date Taking? Authorizing Provider  acetaminophen (TYLENOL) 325 MG tablet Take 1-2 tablets (325-650 mg total) by mouth every 4 (four) hours as needed for mild pain. 05/19/21   Setzer, Edman Circle, PA-C  amLODipine (NORVASC) 5 MG tablet Take 1 tablet (5 mg total) by mouth daily. 06/11/21 06/11/22  Charlott Rakes, MD  aspirin 81 MG EC tablet Take 1 tablet (81 mg total) by mouth daily. Swallow whole. 06/11/21   Charlott Rakes, MD  citalopram (CELEXA) 20 MG tablet Take 1 tablet (20 mg total) by mouth daily. 06/11/21   Charlott Rakes, MD  gabapentin (NEURONTIN) 300 MG capsule Take 1 capsule (300 mg total) by mouth 3 (three) times daily. 06/30/21   Jennye Boroughs, MD  omeprazole (PRILOSEC) 40 MG capsule Take 1 capsule (40 mg total) by mouth daily. 06/11/21 12/08/21  Charlott Rakes, MD  polyethylene glycol powder (GLYCOLAX/MIRALAX) 17 GM/SCOOP powder Take 17 grams by mouth daily. 06/11/21   Charlott Rakes, MD  rosuvastatin (CRESTOR) 20 MG tablet Take 1 tablet (20 mg total) by mouth daily. 06/11/21   Charlott Rakes, MD  tamsulosin (FLOMAX) 0.4 MG CAPS capsule Take 1 capsule (0.4 mg total) by mouth daily. 06/11/21   Charlott Rakes, MD  vitamin B-12 (CYANOCOBALAMIN) 1000 MCG tablet Take 1 tablet (1,000 mcg total) by mouth daily. 08/15/20   Charlott Rakes, MD     Vital Signs: Vitals:   07/14/21 0810  BP: 131/89  Pulse: 97  Resp: 18  Temp: 97.8 F (36.6 C)  SpO2: 100%      Physical Exam awake, alert.  Chest clear to auscultation bilaterally.  Heart with regular rate and rhythm.  Abdomen soft, positive bowel sounds, nontender.  No lower extremity edema.  Some residual right-sided weakness noted , more so in right upper than right lower extremity  Imaging: No results found.  Labs:  CBC: Recent Labs    05/05/21 0700 05/12/21 0737 05/19/21 0825 06/25/21 0810  WBC 7.7 7.1 6.8 6.6  HGB 16.6 16.4 16.0 16.4  HCT 49.1 49.4 49.8 48.1  PLT 238 257 288 256    COAGS: Recent Labs    04/27/21 1012  INR 1.0  APTT 35    BMP: Recent Labs    05/05/21 0700 05/12/21 0737 05/19/21 0825 06/25/21 0810  NA 136 139 139 137  K 3.7 3.4* 3.8 3.5  CL 106 104 105 103  CO2 23 28 27  27  GLUCOSE 91 101* 92 97  BUN 15 18 11 13   CALCIUM 8.9 9.1 9.3 9.5  CREATININE 0.89 0.95 0.98 0.91  GFRNONAA >60 >60 >60 >60    LIVER FUNCTION TESTS: Recent Labs    04/28/21 0059 04/29/21 0409 04/30/21 0535 06/25/21 0810  BILITOT 2.6* 2.4* 2.2* 2.4*  AST 18 16 18 21   ALT 11 11 13 22   ALKPHOS 112 87 102 133*  PROT 6.8 5.8* 6.4* 7.5  ALBUMIN 3.6 3.1* 3.4* 4.4    Assessment and Plan: Pt known to neurointerventional radiology service from consultation on 06/27/21 to discuss right vertebral artery aneurysm.  He has a past medical history significant for GERD,  hypertension, lung nodule and recent stroke.  He also has polycythemia with lytic bone lesions.  He presents today for CT-guided bone marrow biopsy for further evaluation. Patient reportedly had a bone marrow biopsy and aspirate on April 30, 2017 that showed hypercellular with pan myeloid proliferation including increased number of megakaryocytes, some of which display large and slightly atypical morphology.  No increase in blast cells identified and the overall findings were not consider specific but an early myeloproliferative neoplasm was not excluded.Risks and benefits of procedure was discussed with the patient /son including, but not limited to bleeding, infection, damage to adjacent structures or low yield requiring additional tests.  All of the questions were answered and there is agreement to proceed.  Consent signed and in chart.    Electronically Signed: D. Rowe Robert, PA-C 07/14/2021, 8:08 AM   I spent a total of 20 minutes at the the patient's bedside AND on the patient's hospital floor or unit, greater than 50% of which was counseling/coordinating care for CT-guided bone marrow biopsy

## 2021-07-14 NOTE — Telephone Encounter (Addendum)
I spoke with Amy, NP.  She is agreeable to a referral for an AFO for patient's right foot drop.  I called Ailene Ravel at [facility]. Patient would benefit from an AFO for right foot drop. They do not make the AFO but recommended [facility].  Will send patient a mychart message.  Order placed.

## 2021-07-14 NOTE — Telephone Encounter (Signed)
[facility] [place]) If provider  can do a referral for pt's right foot drop. Would like a call from the nurse.

## 2021-07-15 ENCOUNTER — Telehealth: Payer: Self-pay | Admitting: Family Medicine

## 2021-07-15 NOTE — Telephone Encounter (Signed)
Referral for Orthotics sent to the [facility] [phone].

## 2021-07-16 ENCOUNTER — Telehealth: Payer: Self-pay | Admitting: Family Medicine

## 2021-07-16 DIAGNOSIS — G319 Degenerative disease of nervous system, unspecified: Secondary | ICD-10-CM | POA: Diagnosis not present

## 2021-07-16 DIAGNOSIS — I69351 Hemiplegia and hemiparesis following cerebral infarction affecting right dominant side: Secondary | ICD-10-CM | POA: Diagnosis not present

## 2021-07-16 DIAGNOSIS — I2510 Atherosclerotic heart disease of native coronary artery without angina pectoris: Secondary | ICD-10-CM | POA: Diagnosis not present

## 2021-07-16 DIAGNOSIS — I358 Other nonrheumatic aortic valve disorders: Secondary | ICD-10-CM | POA: Diagnosis not present

## 2021-07-16 DIAGNOSIS — I10 Essential (primary) hypertension: Secondary | ICD-10-CM | POA: Diagnosis not present

## 2021-07-16 DIAGNOSIS — I2584 Coronary atherosclerosis due to calcified coronary lesion: Secondary | ICD-10-CM | POA: Diagnosis not present

## 2021-07-16 NOTE — Telephone Encounter (Unsigned)
Copied from [place] [id number]. Topic: Quick Communication - Home Health Verbal Orders >> Jul 16, 2021  2:36 PM Tessa Lerner A wrote: Caller/Agency: Ailene Ravel / [facility] Number: [phone] Requesting OT/PT/Skilled Nursing/Social Work/Speech Therapy: OT Frequency: Re certification for OT the week of 07/28/21

## 2021-07-17 ENCOUNTER — Ambulatory Visit (INDEPENDENT_AMBULATORY_CARE_PROVIDER_SITE_OTHER): Payer: Medicare Other

## 2021-07-17 ENCOUNTER — Ambulatory Visit (INDEPENDENT_AMBULATORY_CARE_PROVIDER_SITE_OTHER): Payer: Medicare Other | Admitting: Physician Assistant

## 2021-07-17 ENCOUNTER — Encounter: Payer: Self-pay | Admitting: Physician Assistant

## 2021-07-17 DIAGNOSIS — M25531 Pain in right wrist: Secondary | ICD-10-CM

## 2021-07-17 DIAGNOSIS — M25511 Pain in right shoulder: Secondary | ICD-10-CM

## 2021-07-17 NOTE — Progress Notes (Signed)
Office Visit Note   Patient: Aaron Dennis           Date of Birth: 1955-10-06           MRN: [id number] Visit Date: 07/17/2021              Requested by: Charlott Rakes, MD [address],  [place] [id number] PCP: Charlott Rakes, MD   Assessment & Plan: Visit Diagnoses:  1. Acute pain of right shoulder   2. Pain in right wrist     Plan: Offered subacromial injection right shoulder he defers.  Therefore we will send in physical therapy and Occupational Therapy for his right shoulder and right hand.  We will work on range of motion, strengthening, and modalities home exercise program.  Encouraged him to get the pulley system to work on range of motion of the right shoulder and also showed him forward flexion exercises for the shoulder.  See him back in 6 weeks sooner if there is any questions or concerns.  Follow-Up Instructions: Return in about 6 weeks (around 08/28/2021).   Orders:  Orders Placed This Encounter  Procedures   XR Shoulder Right   XR Wrist Complete Right   Ambulatory referral to Physical Therapy   Ambulatory referral to Occupational Therapy   No orders of the defined types were placed in this encounter.     Procedures: No procedures performed   Clinical Data: No additional findings.   Subjective: Chief Complaint  Patient presents with   Right Shoulder - Pain   Right Wrist - Pain    HPI 66 year old male who is seen for the first time for right shoulder and right wrist pain.  He had a stroke 3 months ago which greatly affecting his right side particularly with his right upper extremity.  Since then he has had pain in his right shoulder and wrist.  Notes he had some discomfort in his right shoulder prior to the stroke.  Denies any numbness tingling down the arm.  He did have inpatient therapy and is now currently having home therapy work with him for range of motion and strength in right upper extremity.  Some pain in the shoulder  particularly when he lies on it.  He also is having pain in the right wrist points to the dorsal aspect of the wrist joint.  No known injury.  He did just start wearing a sling for his arm today.  He is taking no medication for this.  States that his primary care provider offered him shoulder injection he deferred.  Review of Systems See HPI otherwise negative noncontributory.  Objective: Vital Signs: There were no vitals taken for this visit.  Physical Exam Constitutional:      Appearance: He is normal weight. He is not ill-appearing or diaphoretic.  Pulmonary:     Effort: Pulmonary effort is normal.  Neurological:     Mental Status: He is alert and oriented to person, place, and time.  Psychiatric:        Mood and Affect: Mood normal.     Ortho Exam Left shoulder full range of motion without pain actively and passively.  5 out of 5 strength left shoulder with external and internal rotation against resistance.  Right shoulder forward flexion actively 90 degrees passively and bring to 100 degrees with painful.  He has considerable weakness of the right upper extremity throughout including the shoulder test cuff strength secondary to weakness and significant pain.  Impingement  testing is positive on the right shoulder.  Right shoulder external and internal rotation reveals fluid motion.  Bilateral hands sensation grossly intact.  Motors full but he is slower to be able to perform functions with his right hand.  Grip strength is greatly diminished on the right compared to left.  Radial pulses are 2+ equal symmetric bilaterally. Specialty Comments:  No specialty comments available.  Imaging: XR Shoulder Right  Result Date: 07/17/2021 Right shoulder 3 views: Shoulder is well located.  Subacromial space well-maintained.  Type II downsloping acromion.  No acute fractures or acute findings  XR Wrist Complete Right  Result Date: 07/17/2021 Right wrist 3 views: No acute fractures.  No  subluxation dislocation of the carpal bones.  Wrist joints well-maintained.  No bony abnormalities.    PMFS History: Patient Active Problem List   Diagnosis Date Noted   Status post stroke due to cerebrovascular disease    Cerebral edema ([place]) 04/29/2021   Stroke (cerebrum) ([place]) 04/29/2021   Right sided weakness    Acute CVA (cerebrovascular accident) ([place]) 04/27/2021   Primary osteoarthritis of left knee 01/13/2019   Primary osteoarthritis of right knee 01/13/2019   Flat foot 11/28/2017   Tendonitis of foot 11/28/2017   Polycythemia 05/12/2017   Vitamin D deficiency 04/21/2017   Bony sclerosis 08/05/2016   Lung nodule    Chronic pain of both knees 12/05/2015   Gastroesophageal reflux disease without esophagitis 05/17/2014   Essential hypertension 05/17/2014   Coronary artery calcification 08/17/2012   Abnormal LFTs (liver function tests) 07/11/2012   Chronic abdominal pain 06/24/2012   Right-sided chest wall pain 06/24/2012   Constipation, chronic 06/24/2012   Hyperbilirubinemia 06/24/2012   Chronic back pain 06/24/2012   DDD (degenerative disc disease), thoracolumbar 06/24/2012   History of nephrolithiasis 06/24/2012   Side pain 06/24/2012   Low back pain at multiple sites 06/24/2012   Past Medical History:  Diagnosis Date   GERD (gastroesophageal reflux disease)    Hypertension    Lung nodule    7 mm LUL nodule    Family History  Problem Relation Age of Onset   Healthy Mother    Diabetes Brother    Colon polyps Neg Hx    Colon cancer Neg Hx    Esophageal cancer Neg Hx    Rectal cancer Neg Hx    Stomach cancer Neg Hx     Past Surgical History:  Procedure Laterality Date   IR RADIOLOGIST EVAL & MGMT  07/02/2021   Social History   Occupational History   Not on file  Tobacco Use   Smoking status: Former   Smokeless tobacco: Never   Tobacco comments:    quit Jan 2018  Vaping Use   Vaping Use: Never used  Substance and Sexual Activity   Alcohol use: No    Drug use: No   Sexual activity: Not Currently

## 2021-07-18 ENCOUNTER — Other Ambulatory Visit (HOSPITAL_COMMUNITY): Payer: Self-pay | Admitting: Interventional Radiology

## 2021-07-18 DIAGNOSIS — I671 Cerebral aneurysm, nonruptured: Secondary | ICD-10-CM

## 2021-07-21 ENCOUNTER — Ambulatory Visit (HOSPITAL_COMMUNITY)
Admission: RE | Admit: 2021-07-21 | Discharge: 2021-07-21 | Disposition: A | Discharge status: Home / Self Care | Payer: Medicare Other | Source: Ambulatory Visit | Attending: Interventional Radiology | Admitting: Interventional Radiology

## 2021-07-21 DIAGNOSIS — I671 Cerebral aneurysm, nonruptured: Secondary | ICD-10-CM | POA: Diagnosis not present

## 2021-07-22 ENCOUNTER — Encounter (HOSPITAL_COMMUNITY): Payer: Self-pay | Admitting: Internal Medicine

## 2021-07-22 DIAGNOSIS — I69351 Hemiplegia and hemiparesis following cerebral infarction affecting right dominant side: Secondary | ICD-10-CM | POA: Diagnosis not present

## 2021-07-22 DIAGNOSIS — I358 Other nonrheumatic aortic valve disorders: Secondary | ICD-10-CM | POA: Diagnosis not present

## 2021-07-22 DIAGNOSIS — I10 Essential (primary) hypertension: Secondary | ICD-10-CM | POA: Diagnosis not present

## 2021-07-22 DIAGNOSIS — I2510 Atherosclerotic heart disease of native coronary artery without angina pectoris: Secondary | ICD-10-CM | POA: Diagnosis not present

## 2021-07-22 DIAGNOSIS — I2584 Coronary atherosclerosis due to calcified coronary lesion: Secondary | ICD-10-CM | POA: Diagnosis not present

## 2021-07-22 DIAGNOSIS — G319 Degenerative disease of nervous system, unspecified: Secondary | ICD-10-CM | POA: Diagnosis not present

## 2021-07-22 HISTORY — PX: IR RADIOLOGIST EVAL & MGMT: IMG5224

## 2021-07-23 ENCOUNTER — Telehealth: Payer: Self-pay | Admitting: Family Medicine

## 2021-07-23 DIAGNOSIS — I358 Other nonrheumatic aortic valve disorders: Secondary | ICD-10-CM | POA: Diagnosis not present

## 2021-07-23 DIAGNOSIS — I69351 Hemiplegia and hemiparesis following cerebral infarction affecting right dominant side: Secondary | ICD-10-CM | POA: Diagnosis not present

## 2021-07-23 DIAGNOSIS — I10 Essential (primary) hypertension: Secondary | ICD-10-CM | POA: Diagnosis not present

## 2021-07-23 DIAGNOSIS — I2584 Coronary atherosclerosis due to calcified coronary lesion: Secondary | ICD-10-CM | POA: Diagnosis not present

## 2021-07-23 DIAGNOSIS — I2510 Atherosclerotic heart disease of native coronary artery without angina pectoris: Secondary | ICD-10-CM | POA: Diagnosis not present

## 2021-07-23 DIAGNOSIS — G319 Degenerative disease of nervous system, unspecified: Secondary | ICD-10-CM | POA: Diagnosis not present

## 2021-07-23 NOTE — Telephone Encounter (Signed)
Copied from [place] [id number]. Topic: Quick Communication - Home Health Verbal Orders >> Jul 16, 2021  2:36 PM [name] wrote: Caller/Agency: [facility] Comment Number: [phone] Requesting OT/PT/Skilled Nursing/Social Work/Speech Therapy: OT Frequency: Re certification for OT the week of 07/28/21

## 2021-07-23 NOTE — Telephone Encounter (Signed)
Ailene Ravel, OT from [facility] called in checking status of order for recertification of O. Please call back

## 2021-07-23 NOTE — Telephone Encounter (Signed)
Called pt left VM on [phone]

## 2021-07-24 ENCOUNTER — Telehealth: Payer: Self-pay | Admitting: Family Medicine

## 2021-07-24 ENCOUNTER — Other Ambulatory Visit: Payer: Self-pay

## 2021-07-24 ENCOUNTER — Inpatient Hospital Stay: Payer: Medicare Other | Attending: Internal Medicine

## 2021-07-24 ENCOUNTER — Inpatient Hospital Stay (HOSPITAL_BASED_OUTPATIENT_CLINIC_OR_DEPARTMENT_OTHER): Payer: Medicare Other | Admitting: Internal Medicine

## 2021-07-24 VITALS — BP 119/82 | HR 78 | Temp 97.8°F | Resp 17 | Wt 158.0 lb

## 2021-07-24 DIAGNOSIS — D751 Secondary polycythemia: Secondary | ICD-10-CM | POA: Diagnosis not present

## 2021-07-24 DIAGNOSIS — Z79899 Other long term (current) drug therapy: Secondary | ICD-10-CM | POA: Insufficient documentation

## 2021-07-24 DIAGNOSIS — Z7982 Long term (current) use of aspirin: Secondary | ICD-10-CM | POA: Insufficient documentation

## 2021-07-24 DIAGNOSIS — I10 Essential (primary) hypertension: Secondary | ICD-10-CM | POA: Insufficient documentation

## 2021-07-24 DIAGNOSIS — M545 Low back pain, unspecified: Secondary | ICD-10-CM

## 2021-07-24 DIAGNOSIS — Z125 Encounter for screening for malignant neoplasm of prostate: Secondary | ICD-10-CM | POA: Diagnosis not present

## 2021-07-24 LAB — CBC WITH DIFFERENTIAL (CANCER CENTER ONLY)
Abs Immature Granulocytes: 0.01 10*3/uL (ref 0.00–0.07)
Basophils Absolute: 0 10*3/uL (ref 0.0–0.1)
Basophils Relative: 1 %
Eosinophils Absolute: 0 10*3/uL (ref 0.0–0.5)
Eosinophils Relative: 1 %
HCT: 45.1 % (ref 39.0–52.0)
Hemoglobin: 15.2 g/dL (ref 13.0–17.0)
Immature Granulocytes: 0 %
Lymphocytes Relative: 31 %
Lymphs Abs: 2 10*3/uL (ref 0.7–4.0)
MCH: 27.4 pg (ref 26.0–34.0)
MCHC: 33.7 g/dL (ref 30.0–36.0)
MCV: 81.4 fL (ref 80.0–100.0)
Monocytes Absolute: 0.4 10*3/uL (ref 0.1–1.0)
Monocytes Relative: 7 %
Neutro Abs: 3.9 10*3/uL (ref 1.7–7.7)
Neutrophils Relative %: 60 %
Platelet Count: 230 10*3/uL (ref 150–400)
RBC: 5.54 MIL/uL (ref 4.22–5.81)
RDW: 13.9 % (ref 11.5–15.5)
WBC Count: 6.4 10*3/uL (ref 4.0–10.5)
nRBC: 0 % (ref 0.0–0.2)

## 2021-07-24 NOTE — Telephone Encounter (Signed)
Called the patient. He states for a couple of weeks he has noticed shaking that is consistent in the right hand. RLE has some but mostly hand. His right arm is the affected arm from the surgery. Currently he is not completing PT. Apt was made to come in with Amy on 6/20

## 2021-07-24 NOTE — Telephone Encounter (Signed)
Patient came in and has appt in September. Has a new issue and needs appt sooner. Leg/arm is shaking. Schedule 6/27 next available appt wanted to see if can get in sooner or see someone else. Please call patient to discuss.

## 2021-07-24 NOTE — Telephone Encounter (Signed)
We are monitoring for any sooner appt/cx

## 2021-07-24 NOTE — Progress Notes (Signed)
[place] Telephone:([phone]) [phone]   Fax:(336) [phone]  OFFICE PROGRESS NOTE  Charlott Rakes, MD [address] [place] [address]  DIAGNOSIS: Polycythemia of unclear etiology likely to be reactive in nature but the patient also has nonspecific findings on a bone marrow biopsy and aspirate that was performed around 4 years ago suspicious for early myeloproliferative disorder.  His JAK2 mutation panel at that time was negative.  He was recently diagnosed with stroke likely secondary to hypertension.  PRIOR THERAPY: None  CURRENT THERAPY: Observation  INTERVAL HISTORY: Aaron Dennis 66 y.o. male returns to the clinic today for follow-up visit accompanied by his son.  The patient is feeling fine today with no concerning complaints except for the residual weakness in his right upper extremity.  He denied having any current chest pain, shortness of breath, cough or hemoptysis.  He has no nausea, vomiting, diarrhea or constipation.  He has no headache or visual changes.  He underwent extensive work-up for evaluation of the polycythemia including JAK2 mutation that was unremarkable for JAK2 abnormalities.  The patient has normal erythropoietin level, normal ferritin as well as hemochromatosis gene analysis.  He also underwent a bone marrow biopsy and aspirate that was unremarkable for any abnormalities.  The patient is here today for evaluation and repeat CBC.  MEDICAL HISTORY: Past Medical History:  Diagnosis Date   GERD (gastroesophageal reflux disease)    Hypertension    Lung nodule    7 mm LUL nodule    ALLERGIES:  is allergic to beef-derived products, fish-derived products, and pork-derived products.  MEDICATIONS:  Current Outpatient Medications  Medication Sig Dispense Refill   acetaminophen (TYLENOL) 325 MG tablet Take 1-2 tablets (325-650 mg total) by mouth every 4 (four) hours as needed for mild pain.     amLODipine (NORVASC) 5 MG tablet Take  1 tablet (5 mg total) by mouth daily. 90 tablet 1   aspirin 81 MG EC tablet Take 1 tablet (81 mg total) by mouth daily. Swallow whole. 90 tablet 1   citalopram (CELEXA) 20 MG tablet Take 1 tablet (20 mg total) by mouth daily. 90 tablet 1   gabapentin (NEURONTIN) 300 MG capsule Take 1 capsule (300 mg total) by mouth 3 (three) times daily. 90 capsule 2   omeprazole (PRILOSEC) 40 MG capsule Take 1 capsule (40 mg total) by mouth daily. 90 capsule 1   polyethylene glycol powder (GLYCOLAX/MIRALAX) 17 GM/SCOOP powder Take 17 grams by mouth daily. 3350 g 1   rosuvastatin (CRESTOR) 20 MG tablet Take 1 tablet (20 mg total) by mouth daily. 90 tablet 1   tamsulosin (FLOMAX) 0.4 MG CAPS capsule Take 1 capsule (0.4 mg total) by mouth daily. 90 capsule 1   vitamin B-12 (CYANOCOBALAMIN) 1000 MCG tablet Take 1 tablet (1,000 mcg total) by mouth daily. 90 tablet 1   No current facility-administered medications for this visit.    SURGICAL HISTORY:  Past Surgical History:  Procedure Laterality Date   IR RADIOLOGIST EVAL & MGMT  07/02/2021   IR RADIOLOGIST EVAL & MGMT  07/22/2021    REVIEW OF SYSTEMS:  A comprehensive review of systems was negative except for: Neurological: positive for weakness   PHYSICAL EXAMINATION: General appearance: alert, cooperative, and no distress Head: Normocephalic, without obvious abnormality, atraumatic Neck: no adenopathy, no JVD, supple, symmetrical, trachea midline, and thyroid not enlarged, symmetric, no tenderness/mass/nodules Lymph nodes: Cervical, supraclavicular, and axillary nodes normal. Resp: clear to auscultation bilaterally Back:  symmetric, no curvature. ROM normal. No CVA tenderness. Cardio: regular rate and rhythm, S1, S2 normal, no murmur, click, rub or gallop GI: soft, non-tender; bowel sounds normal; no masses,  no organomegaly Extremities: extremities normal, atraumatic, no cyanosis or edema  ECOG PERFORMANCE STATUS: 1 - Symptomatic but completely  ambulatory  Blood pressure 119/82, pulse 78, temperature 97.8 F (36.6 C), temperature source Oral, resp. rate 17, weight 158 lb (71.7 kg), SpO2 100 %.  LABORATORY DATA: Lab Results  Component Value Date   WBC 6.4 07/24/2021   HGB 15.2 07/24/2021   HCT 45.1 07/24/2021   MCV 81.4 07/24/2021   PLT 230 07/24/2021      Chemistry      Component Value Date/Time   NA 137 06/25/2021 0810   NA 139 06/26/2020 0944   K 3.5 06/25/2021 0810   CL 103 06/25/2021 0810   CO2 27 06/25/2021 0810   BUN 13 06/25/2021 0810   BUN 19 06/26/2020 0944   CREATININE 0.91 06/25/2021 0810   CREATININE 1.02 12/05/2015 1109      Component Value Date/Time   CALCIUM 9.5 06/25/2021 0810   ALKPHOS 133 (H) 06/25/2021 0810   AST 21 06/25/2021 0810   ALT 22 06/25/2021 0810   BILITOT 2.4 (H) 06/25/2021 0810       RADIOGRAPHIC STUDIES: IR Radiologist Eval & Mgmt  Result Date: 07/22/2021 EXAM: ESTABLISHED PATIENT OFFICE VISIT CHIEF COMPLAINT: Discuss the diagnostic catheter arteriogram. Current Pain Level: 1-10 HISTORY OF PRESENT ILLNESS: 66 year old right handed gentleman who was last seen in consultation on 06/27/2021 regarding management of an intradural right vertebral artery aneurysm. Patient was accompanied by his brother. The patient returns today accompanied by his son, and his brother. Patient and the son had some questions regarding the procedure of the diagnostic catheter arteriogram. The procedure was again discussed with the patient with questions being asked by the son and the patient. The reason for the diagnostic catheter arteriogram was again relieved. This was to more accurately identify the etiology of the intradural right vertebral artery aneurysms in order to tailor appropriate endovascular management. The endovascular procedure would be either via the right radial of the femoral route. Artery supplying the brain in addition to the right vertebral artery would be examined via selective  angiography. The findings of the diagnostic catheter arteriogram would dictate appropriate endovascular management. The risk of the diagnostic angiogram was stated to be 1 in a 1,000 in patients over age 65 with previous history of stroke, hypertension, tobacco use or diabetes. The diagnostic procedure would be a day procedure. Patient would return home in 3-4 hours with specific instructions. The endovascular treatment portion would be under general anesthesia. Would be discussed in more detail following the results of the diagnostic catheter arteriogram. Past Medical History: Unchanged. Medications: Unchanged. Allergies: Unchanged. Social History: Unchanged. Family History: Unchanged. REVIEW OF SYSTEMS: Negative, unless as mentioned above. PHYSICAL EXAMINATION: Right hand paresthesias arm greater than leg with improvement in the right lower extremity strength. ASSESSMENT AND PLAN: The diagnostic catheter arteriogram will be scheduled once the patient has completed his PT OT program. Patient also advised to visit with his neurologist regarding involuntary movements of the right upper extremity. They leave with good understanding and agreement with the above management plan. Electronically Signed   By: Luanne Bras M.D.   On: 07/22/2021 08:19   XR Shoulder Right  Result Date: 07/17/2021 Right shoulder 3 views: Shoulder is well located.  Subacromial space well-maintained.  Type II downsloping acromion.  No acute fractures or acute findings  XR Wrist Complete Right  Result Date: 07/17/2021 Right wrist 3 views: No acute fractures.  No subluxation dislocation of the carpal bones.  Wrist joints well-maintained.  No bony abnormalities.  CT Biopsy  Result Date: 07/14/2021 INDICATION: Polycythemia EXAM: CT GUIDED BONE MARROW ASPIRATION AND CORE BIOPSY RADIATION DOSE REDUCTION: This exam was performed according to the departmental dose-optimization program which includes automated exposure control, adjustment  of the mA and/or kV according to patient size and/or use of iterative reconstruction technique. MEDICATIONS: None. ANESTHESIA/SEDATION: Moderate (conscious) sedation was employed during this procedure. A total of Versed 2 mg and Fentanyl 100 mcg was administered intravenously. Moderate Sedation Time: 18 minutes. The patient's level of consciousness and vital signs were monitored continuously by radiology nursing throughout the procedure under my direct supervision. FLUOROSCOPY TIME:  CT dose in mGy was not provided. COMPLICATIONS: None immediate. Estimated blood loss: <5 mL PROCEDURE: Informed written consent was obtained from the patient after a thorough discussion of the procedural risks, benefits and alternatives. All questions were addressed. Maximal Sterile Barrier Technique was utilized including caps, mask, sterile gowns, sterile gloves, sterile drape, hand hygiene and skin antiseptic. A timeout was performed prior to the initiation of the procedure. The patient was positioned prone and non-contrast localization CT was performed of the pelvis to demonstrate the iliac marrow spaces. Maximal barrier sterile technique utilized including caps, mask, sterile gowns, sterile gloves, large sterile drape, hand hygiene, and chlorhexidine prep. Under sterile conditions and local anesthesia, an 11 gauge coaxial bone biopsy needle was advanced into the RIGHT iliac marrow space. Needle position was confirmed with CT imaging. Initially, bone marrow aspiration was performed. Next, the 11 gauge outer cannula was utilized to obtain a 1 iliac bone marrow core biopsy. Needle was removed. Hemostasis was obtained with compression. The patient tolerated the procedure well. Samples were prepared with the cytotechnologist. IMPRESSION: Successful CT-guided bone marrow aspiration and biopsy, as above. Michaelle Birks, MD [facility] Electronically Signed   By: Michaelle Birks M.D.    On: 07/14/2021 17:00   CT BONE MARROW BIOPSY & ASPIRATION  Result Date: 07/14/2021 INDICATION: Polycythemia EXAM: CT GUIDED BONE MARROW ASPIRATION AND CORE BIOPSY RADIATION DOSE REDUCTION: This exam was performed according to the departmental dose-optimization program which includes automated exposure control, adjustment of the mA and/or kV according to patient size and/or use of iterative reconstruction technique. MEDICATIONS: None. ANESTHESIA/SEDATION: Moderate (conscious) sedation was employed during this procedure. A total of Versed 2 mg and Fentanyl 100 mcg was administered intravenously. Moderate Sedation Time: 18 minutes. The patient's level of consciousness and vital signs were monitored continuously by radiology nursing throughout the procedure under my direct supervision. FLUOROSCOPY TIME:  CT dose in mGy was not provided. COMPLICATIONS: None immediate. Estimated blood loss: <5 mL PROCEDURE: Informed written consent was obtained from the patient after a thorough discussion of the procedural risks, benefits and alternatives. All questions were addressed. Maximal Sterile Barrier Technique was utilized including caps, mask, sterile gowns, sterile gloves, sterile drape, hand hygiene and skin antiseptic. A timeout was performed prior to the initiation of the procedure. The patient was positioned prone and non-contrast localization CT was performed of the pelvis to demonstrate the iliac marrow spaces. Maximal barrier sterile technique utilized including caps, mask, sterile gowns, sterile gloves, large sterile drape, hand hygiene, and chlorhexidine prep. Under sterile conditions and local anesthesia, an 11 gauge coaxial bone biopsy needle was advanced into the RIGHT iliac marrow  space. Needle position was confirmed with CT imaging. Initially, bone marrow aspiration was performed. Next, the 11 gauge outer cannula was utilized to obtain a 1 iliac bone marrow core biopsy. Needle was removed. Hemostasis was  obtained with compression. The patient tolerated the procedure well. Samples were prepared with the cytotechnologist. IMPRESSION: Successful CT-guided bone marrow aspiration and biopsy, as above. Michaelle Birks, MD [facility] Electronically Signed   By: Michaelle Birks M.D.   On: 07/14/2021 17:00   IR Radiologist Eval & Mgmt  Result Date: 07/02/2021 EXAM: NEW PATIENT OFFICE VISIT CHIEF COMPLAINT: Discovery of intracranial aneurysm. Current Pain Level: 1-10 HISTORY OF PRESENT ILLNESS: The patient is accompanied by his brother both of whom speak reasonable [place]. A video interpreter was used for much of the communication. 67 year old right-handed male who is referred for the communication. Evaluation and treatment through interpreter of a recently discovered right vertebral artery intradural aneurysm measuring approximately 4 mm x 4 mm. This was noted on a CT angiogram of the head and neck at the time of his workup for acute ischemic stroke on 04/27/2021. 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 Patient with gradual improvement in the weakness in the right upper and right lower extremities. Patient is able to ambulate with a walker most of the time. His speech is normal. He does need help sometimes with showering and eating. He denies recent severe headaches, nausea, vomiting, photophobia, episodes of loss of awareness or of consciousness or seizures. Patient reports no significant changes in his cognitive function. He denies recent headaches, nausea, vomiting, photophobia, seizures or episodes of loss of consciousness. Denies any visual symptoms diplopia, loss of vision or amaurosis fugax. He denies recent chills, fever or rigors. Appetite is normal. Weight  is steady. Diagnosis * : Date . * : GERD (gastroesophageal reflux disease) * : . * : Hypertension * : . * : Lung nodule * : * : 7 mm LUL nodule To circumvent this partially with 15. No past surgical history on file. Family History Problem * : Relation * : Age of Onset . * : Healthy * : Mother * : . * : Diabetes * : Brother * : . * : Colon polyps * : Neg Hx * : . * : Colon cancer * : Neg Hx * : . * : Esophageal cancer * : Neg Hx * : . * : Rectal cancer * : Neg Hx * : . * : Stomach cancer * : Neg Hx * : Social History Tobacco Use . * : Smoking status: * : Former . * : Smokeless tobacco: * : Never . * : Tobacco comments: * : * : quit Jan 2018 Vaping Use . * : Vaping Use: * : Never used Substance Use Topics . * : Alcohol use: * : No . * : Drug use: * : No Allergies Allergen * : Reactions . * : Beef-Derived Products * : . * : Fish-Derived Products * : * : * : Does not eat fish . * : Pork-Derived Products * : Other (See Comments) * : * : Cultural preference Current Outpatient Medications Medication * : Sig * : Dispense * : Refill . * : acetaminophen (TYLENOL) 325 MG tablet * : Take 1-2 tablets (325-650 mg total) by  mouth every 4 (four) hours as needed for mild pain. * : * : . * : amLODipine (NORVASC) 5 MG tablet * : Take 1 tablet (5 mg total) by mouth daily. * : 90 tablet * : 1 . * : aspirin 81 MG EC tablet * : Take 1 tablet (81 mg total) by mouth daily. Swallow whole. * : 90 tablet * : 1 . * : citalopram (CELEXA) 20 MG tablet * : Take 1 tablet (20 mg total) by mouth daily. * : 90 tablet * : 1 . * : hydrocortisone-pramoxine (ANALPRAM HC) 2.5-1 % rectal cream * : Place 1 application rectally 3 (three) times daily. * : 30 g * : 1 . * : lidocaine (LIDODERM) 5 % * : Place 1 patch onto the skin daily. Remove & Discard patch within 12 hours or as directed by MD * : 30 patch * : 0 . * : meclizine (ANTIVERT) 25 MG tablet * : Take 1 tablet (25 mg total) by mouth 2 (two) times daily as needed for dizziness. * : 60 tablet * : 1 . *  : meloxicam (MOBIC) 7.5 MG tablet * : Take 1 tablet (7.5 mg total) by mouth daily. * : 90 tablet * : 1 . * : omeprazole (PRILOSEC) 40 MG capsule * : Take 1 capsule (40 mg total) by mouth daily. * : 90 capsule * : 1 . * : polyethylene glycol powder (GLYCOLAX/MIRALAX) 17 GM/SCOOP powder * : Take 17 grams by mouth daily. * : 3350 g * : 1 . * : rosuvastatin (CRESTOR) 20 MG tablet * : Take 1 tablet (20 mg total) by mouth daily. * : 90 tablet * : 1 . * : tamsulosin (FLOMAX) 0.4 MG CAPS capsule * : Take 1 capsule (0.4 mg total) by mouth daily. * : 90 capsule * : 1 . * : traZODone (DESYREL) 100 MG tablet * : Take 1 tablet (100 mg total) by mouth at bedtime. * : 90 tablet * : 1 . * : vitamin B-12 (CYANOCOBALAMIN) 1000 MCG tablet * : Take 1 tablet (1,000 mcg total) by mouth daily. * : 90 tablet * : 1 Family History:  No family history of brain aneurysms. REVIEW OF SYSTEMS: Negative unless as mentioned above. PHYSICAL EXAMINATION: No dysarthria or lateralizing and in no acute distress. Normal eye contact. Responses appropriate and prompt. Patient's left and the right upper extremity almost against gravity with very weak hand grip. His right lower extremity is approximately 4/5 distally and proximally. The remainder of the neurological examination was unremarkable. Station and gait was not tested. ASSESSMENT AND PLAN: Brief discussion of brain aneurysms was then undertaken The patient and the brother were informed that this was an incidental finding during workup for ischemic stroke. History of unruptured intracranial aneurysms and family history of brain aneurysms ruptured or unruptured was reviewed. Risk of rupture of 1-2% per year with attendant significant mortality and morbidity was reviewed. They were informed of the increased risk of rupture with hypertension, smoking, use of illicit chemicals, family history of ruptured or unruptured brain aneurysms. Management considerations were those of continued regular surveillance  with neuro imaging at regular intervals versus limitation of the aneurysm from the circulation in order to limit the risk of rupture and/or growth. A formal diagnostic catheter arteriogram would be needed in order to evaluate angio architecture of the aneurysm, including evaluating the suspected stenosis in the posterior circulation as described above. The diagnostic procedure would be a  day procedure either via the radial or the femoral route. Patient would spend 3-4 hours in short-stay after the procedure. Depending on the results of the diagnostic arteriogram further management considerations would be discussed. Patient and the brother expressed a desire to proceed with a diagnostic arteriogram which will be which will be scheduled in 4 to 6 weeks time following completion of his physical therapy. Electronically Signed   By: Luanne Bras M.D.   On: 07/02/2021 14:44    ASSESSMENT AND PLAN: This is a very pleasant 66 years old African male who presented for evaluation of polycythemia after recent stroke diagnosis.  He has extensive work-up for the polycythemia that was unremarkable including bone marrow biopsy and aspirate, hemochromatosis, JAK2 mutations, erythropoietin level. I think his stroke is secondary to his uncontrolled hypertension at that time. I recommended for the patient to continue on observation with close follow-up by his primary care physician.  His CBC today is unremarkable for any abnormality. I also advised him against iron rich diet. I will see him on as-needed basis at this point. The patient was advised to call immediately if he has any other concerning symptoms in the interval. The patient voices understanding of current disease status and treatment options and is in agreement with the current care plan.  All questions were answered. The patient knows to call the clinic with any problems, questions or concerns. We can certainly see the patient much sooner if  necessary.  The total time spent in the appointment was 20 minutes.  Disclaimer: This note was dictated with voice recognition software. Similar sounding words can inadvertently be transcribed and may not be corrected upon review.

## 2021-07-26 LAB — PSA, TOTAL AND FREE
PSA, Free Pct: 22.9 %
PSA, Free: 0.48 ng/mL
Prostate Specific Ag, Serum: 2.1 ng/mL (ref 0.0–4.0)

## 2021-07-28 NOTE — Progress Notes (Signed)
Myeloproliferative disorder

## 2021-07-29 ENCOUNTER — Ambulatory Visit (INDEPENDENT_AMBULATORY_CARE_PROVIDER_SITE_OTHER): Payer: Medicare Other | Admitting: Family Medicine

## 2021-07-29 ENCOUNTER — Encounter: Payer: Self-pay | Admitting: Family Medicine

## 2021-07-29 VITALS — BP 125/85 | HR 72 | Ht 70.0 in | Wt 158.0 lb

## 2021-07-29 DIAGNOSIS — I639 Cerebral infarction, unspecified: Secondary | ICD-10-CM | POA: Diagnosis not present

## 2021-07-29 DIAGNOSIS — G252 Other specified forms of tremor: Secondary | ICD-10-CM

## 2021-07-29 DIAGNOSIS — M21371 Foot drop, right foot: Secondary | ICD-10-CM

## 2021-07-29 DIAGNOSIS — R531 Weakness: Secondary | ICD-10-CM | POA: Diagnosis not present

## 2021-07-29 NOTE — Patient Instructions (Addendum)
Below is our plan:  We will continue to monitor. I ma not overly concerned about right hand tremor at this time. It could be related to the weakness and shoulder pain. I want you to continue PT/OT as directed by rehab. Consider gabapentin if shoulder pain worsens.   Please make sure you are staying well hydrated. I recommend 50-60 ounces daily. Well balanced diet and regular exercise encouraged. Consistent sleep schedule with 6-8 hours recommended.   Please continue follow up with care team as directed. Continue asa '81mg'$  and rosuvastatin '20mg'$  daily.   Follow up with me in 3 months  You may receive a survey regarding today's visit. I encourage you to leave honest feed back as I do use this information to improve patient care. Thank you for seeing me today!

## 2021-07-29 NOTE — Telephone Encounter (Signed)
Noted  

## 2021-07-29 NOTE — Progress Notes (Signed)
Chief Complaint  Patient presents with   Follow-up    Room 10, with brother  C/o right hand tremors and pain in shoulder, sx started 2 weeks ago     HISTORY OF PRESENT ILLNESS:  07/29/21 ALL:  Aaron Dennis is a 66 y.o. male here today for follow up. He was seen 07/02/2021 for hospital follow up following left frontal parietal infarct with residual right upper and lower ext weakness. He underwent inpatient rehab then discharged to home PT. At last visit he reported improving strengths of right upper and lower ext. He was working with home PT/OT. He was seeing Dr Curlene Dolphin, rehab, for right shoulder pain. Injection declined and he was started on gabapentin and meloxicam. He takes meloxicam as needed but has not started gabapentin.   Since, he is doing fairly well. He continues to have right upper > left lower weakness. He completed first round of outpatient PT/OT but was reordered due to continued RUE weakness and right shoulder pain. He is scheduled to start therapy next week. He has noticed mild shaking of right hand over the past 2-3 weeks. He notices shaking when trying to raise his hand. He denies resting tremor. Shaking does not interfere with functioning. He continues to ambulate with quad walker. PT recommended AFO brace for right foot drop. He has been fitted but not received brace.   He continues close follow up with PCP. He reports BP is well managed at home. He continues asa 109m and rosuvastatin 219mdaily. No stroke like symptoms.   He was seen by Dr DeEstanislado Pandy/13/2023 for monitoring of intradural right vertebral artery aneurysm. Diagnostic arteriogram planned to be performed following completion of PT/OT.   HISTORY (copied from my previous note)  Aaron Dennis a [age].o. who  has a past medical history of GERD (gastroesophageal reflux disease), Hypertension, and Lung nodule.  Patient presented on 04/27/2021 with fie days of waxing and waning right sided weakness without  other deficits. CT negative. MR brain showed acute left frontoparietal infarct with mild associated edema without mass affect with findings consistent with chronic microvascular disease. CTA showed moderate stenosis of distal L intradural vertebral artery with [id number], low risk for rupture. He was started on Plavix and asa 49m9m3 weeks then Plavix alone. During hospitalization he lost motor function of right upper ext, RLE remained at 3/5 strength. He was discharged to [facility]. Personally reviewed hospitalization pertinent progress notes, lab work and imaging.  Evaluated by Dr Xu. Erlinda HongHe was admitted to rehab 3/21. He underwent extensive therapies 5 days a week. He had improvement in right sided weakness and ability to compensate for deficits and discharged home 05/20/2021. Plavix was discontinued 05/19/2021. He continues asa 49mg26mnce being home, he has continued home PT/OT. He is able to walk with quad cane. His brother has helped take care of him. He lives with his wife and two adult children.   He was seen by PCP 06/11/2021. BP was controlled on Norvasc 5mg. 65mwas swithced in hospital from Lipitor 10mg t36mestor 20mg. R59m sided weakness improving but reported right shoulder pain. Meloxicam 7.5mg dail73mrescribed. Celexa and trazodone continued for depression and sleep. He was referred to IR for monitoring of aneurysm. Referral to neurology as well for stroke follow up.   He was seen in follow up with Dr ShtridelmCurlene Dolphinent rehab 06/30/2021. He was offered right shoulder injection for pain with subluxation but declined. He was  started on gabapentin 331m titrating to TID dosing. He has not started this medication.   He is followed by hem/onc for polycythemia. He is planning to repeat bone marrow biopsy and lab testing due to previous concerns of skeletal lesions concerning for myeloproliferative disease.    REVIEW OF SYSTEMS: Out of a complete 14 system review of symptoms, the  patient complains only of the following symptoms, right arm weakness, shaking of right arm, and all other reviewed systems are negative.   ALLERGIES: Allergies  Allergen Reactions   Beef-Derived Products    Fish-Derived Products     Does not eat fish   Pork-Derived Products Other (See Comments)    Cultural preference      HOME MEDICATIONS: Outpatient Medications Prior to Visit  Medication Sig Dispense Refill   acetaminophen (TYLENOL) 325 MG tablet Take 1-2 tablets (325-650 mg total) by mouth every 4 (four) hours as needed for mild pain.     amLODipine (NORVASC) 5 MG tablet Take 1 tablet (5 mg total) by mouth daily. 90 tablet 1   aspirin 81 MG EC tablet Take 1 tablet (81 mg total) by mouth daily. Swallow whole. 90 tablet 1   citalopram (CELEXA) 20 MG tablet Take 1 tablet (20 mg total) by mouth daily. 90 tablet 1   gabapentin (NEURONTIN) 300 MG capsule Take 1 capsule (300 mg total) by mouth 3 (three) times daily. 90 capsule 2   omeprazole (PRILOSEC) 40 MG capsule Take 1 capsule (40 mg total) by mouth daily. 90 capsule 1   polyethylene glycol powder (GLYCOLAX/MIRALAX) 17 GM/SCOOP powder Take 17 grams by mouth daily. 3350 g 1   rosuvastatin (CRESTOR) 20 MG tablet Take 1 tablet (20 mg total) by mouth daily. 90 tablet 1   tamsulosin (FLOMAX) 0.4 MG CAPS capsule Take 1 capsule (0.4 mg total) by mouth daily. 90 capsule 1   vitamin B-12 (CYANOCOBALAMIN) 1000 MCG tablet Take 1 tablet (1,000 mcg total) by mouth daily. 90 tablet 1   No facility-administered medications prior to visit.     PAST MEDICAL HISTORY: Past Medical History:  Diagnosis Date   GERD (gastroesophageal reflux disease)    Hypertension    Lung nodule    7 mm LUL nodule     PAST SURGICAL HISTORY: Past Surgical History:  Procedure Laterality Date   IR RADIOLOGIST EVAL & MGMT  07/02/2021   IR RADIOLOGIST EVAL & MGMT  07/22/2021     FAMILY HISTORY: Family History  Problem Relation Age of Onset   Healthy Mother     Diabetes Brother    Colon polyps Neg Hx    Colon cancer Neg Hx    Esophageal cancer Neg Hx    Rectal cancer Neg Hx    Stomach cancer Neg Hx      SOCIAL HISTORY: Social History   Socioeconomic History   Marital status: Married    Spouse name: Not on file   Number of children: Not on file   Years of education: Not on file   Highest education level: Not on file  Occupational History   Not on file  Tobacco Use   Smoking status: Former   Smokeless tobacco: Never   Tobacco comments:    quit Jan 2018  Vaping Use   Vaping Use: Never used  Substance and Sexual Activity   Alcohol use: No   Drug use: No   Sexual activity: Not Currently  Other Topics Concern   Not on file  Social History Narrative   Not  on file   Social Determinants of Health   Financial Resource Strain: Not on file  Food Insecurity: Not on file  Transportation Needs: Not on file  Physical Activity: Not on file  Stress: Not on file  Social Connections: Not on file  Intimate Partner Violence: Not on file     PHYSICAL EXAM  Vitals:   07/29/21 1127  BP: 125/85  Pulse: 72  Weight: 158 lb (71.7 kg)  Height: _0  (1.778 m)   Body mass index is 22.67 kg/m.  Generalized: Well developed, in no acute distress  Cardiology: normal rate and rhythm, no murmur auscultated  Respiratory: clear to auscultation bilaterally    Neurological examination  Mentation: Alert oriented to time, place, history taking. Follows all commands speech and language fluent Cranial nerve II-XII: Pupils were equal round reactive to light. Extraocular movements were full, visual field were full on confrontational test. Facial sensation and strength were normal. Head turning and shoulder shrug  were normal and symmetric. Motor: The motor testing reveals 5 over 5 strength of left upper and lower ext, right upper 3+/5 proximal, 2/5 distally, right lower 4/5 Sensory: Sensory testing is intact to soft touch on all 4 extremities. No  evidence of extinction is noted.  Coordination: Cerebellar testing reveals good finger-nose-finger and heel-to-shin on left, unable to perform right upper ext, right lower reduced.  Gait and station: uses left arm to push to standing position, hemiplegic gait, right foot drop noted, fairly stable with quad cane    DIAGNOSTIC DATA (LABS, IMAGING, TESTING) - I reviewed patient records, labs, notes, testing and imaging myself where available.  Lab Results  Component Value Date   WBC 6.4 07/24/2021   HGB 15.2 07/24/2021   HCT 45.1 07/24/2021   MCV 81.4 07/24/2021   PLT 230 07/24/2021      Component Value Date/Time   NA 137 06/25/2021 0810   NA 139 06/26/2020 0944   K 3.5 06/25/2021 0810   CL 103 06/25/2021 0810   CO2 27 06/25/2021 0810   GLUCOSE 97 06/25/2021 0810   BUN 13 06/25/2021 0810   BUN 19 06/26/2020 0944   CREATININE 0.91 06/25/2021 0810   CREATININE 1.02 12/05/2015 1109   CALCIUM 9.5 06/25/2021 0810   PROT 7.5 06/25/2021 0810   PROT 6.9 06/26/2020 0944   ALBUMIN 4.4 06/25/2021 0810   ALBUMIN 4.3 06/26/2020 0944   AST 21 06/25/2021 0810   ALT 22 06/25/2021 0810   ALKPHOS 133 (H) 06/25/2021 0810   BILITOT 2.4 (H) 06/25/2021 0810   GFRNONAA >60 06/25/2021 0810   GFRNONAA 80 12/05/2015 1109   GFRAA 93 11/29/2018 1453   GFRAA >60 04/28/2017 1231   GFRAA >89 12/05/2015 1109   Lab Results  Component Value Date   CHOL 159 04/27/2021   HDL 29 (L) 04/27/2021   LDLCALC 120 (H) 04/27/2021   TRIG 52 04/27/2021   CHOLHDL 5.5 04/27/2021   Lab Results  Component Value Date   HGBA1C 4.9 04/27/2021   Lab Results  Component Value Date   VITAMINB12 173 (L) 08/14/2020   Lab Results  Component Value Date   TSH 1.500 06/26/2020        No data to display               No data to display           ASSESSMENT AND PLAN  66 y.o. year old male  has a past medical history of GERD (gastroesophageal reflux disease), Hypertension, and  Lung nodule. here with     Acute CVA (cerebrovascular accident) ([place])  Right sided weakness  Right foot drop  Action tremor  BENYAMIN JEFF is doing fairly well. He continues to have right upper ext weakness and has noticed mild shaking of right hand. Symptoms seem to have appeared since last PT visit. He has not had therapy in 3-4 weeks but is scheduled to restart therapy next week. Very mild action tremor noted of right hand. I recommend continued PT/OT. We have discussed possible relationship with shoulder pain. I have encouraged him to continue close follow up with rehab and consider starting gabapentin as directed. He will continue asa and statin as directed by PCP. Stroke prevention reviewed. We will send [facility] to [facility] for right AFO d/t right foot drop s/p CVA. Healthy lifestyle habits encouraged. He will follow up with PCP as directed. He will return to see me in 3 months, sooner if needed. He verbalizes understanding and agreement with this plan.   No orders of the defined types were placed in this encounter.    No orders of the defined types were placed in this encounter.    Debbora Presto, MSN, FNP-C 07/29/2021, 2:08 PM  [facility] [address], [place], [place] [address] [phone]

## 2021-08-04 ENCOUNTER — Encounter: Payer: Self-pay | Admitting: Occupational Therapy

## 2021-08-04 ENCOUNTER — Ambulatory Visit: Payer: Medicare Other | Attending: Physician Assistant

## 2021-08-04 ENCOUNTER — Ambulatory Visit: Payer: Medicare Other | Admitting: Occupational Therapy

## 2021-08-04 DIAGNOSIS — M6281 Muscle weakness (generalized): Secondary | ICD-10-CM

## 2021-08-04 DIAGNOSIS — I63412 Cerebral infarction due to embolism of left middle cerebral artery: Secondary | ICD-10-CM | POA: Diagnosis not present

## 2021-08-04 DIAGNOSIS — M545 Low back pain, unspecified: Secondary | ICD-10-CM | POA: Diagnosis not present

## 2021-08-04 DIAGNOSIS — Z8673 Personal history of transient ischemic attack (TIA), and cerebral infarction without residual deficits: Secondary | ICD-10-CM | POA: Insufficient documentation

## 2021-08-04 DIAGNOSIS — M25511 Pain in right shoulder: Secondary | ICD-10-CM | POA: Insufficient documentation

## 2021-08-04 DIAGNOSIS — I69351 Hemiplegia and hemiparesis following cerebral infarction affecting right dominant side: Secondary | ICD-10-CM

## 2021-08-04 DIAGNOSIS — R2689 Other abnormalities of gait and mobility: Secondary | ICD-10-CM | POA: Insufficient documentation

## 2021-08-04 DIAGNOSIS — R29818 Other symptoms and signs involving the nervous system: Secondary | ICD-10-CM

## 2021-08-04 DIAGNOSIS — G8929 Other chronic pain: Secondary | ICD-10-CM | POA: Diagnosis not present

## 2021-08-04 DIAGNOSIS — R293 Abnormal posture: Secondary | ICD-10-CM | POA: Insufficient documentation

## 2021-08-04 DIAGNOSIS — M25531 Pain in right wrist: Secondary | ICD-10-CM | POA: Diagnosis not present

## 2021-08-04 NOTE — Therapy (Addendum)
OUTPATIENT PHYSICAL THERAPY SHOULDER EVALUATION   Patient Name: Aaron Dennis MRN: [id number] DOB:05/03/1955, 66 y.o., male Today's Date: 08/04/2021   PT End of Session - 08/04/21 1018     Visit Number 1    Date for PT Re-Evaluation 10/06/21    Authorization Type [employer] Medicare    Progress Note Due on Visit 10    PT Start Time 1018    PT Stop Time 1100    PT Time Calculation (min) 42 min    Activity Tolerance Patient tolerated treatment well;Patient limited by pain    Behavior During Therapy WFL for tasks assessed/performed             Past Medical History:  Diagnosis Date   GERD (gastroesophageal reflux disease)    Hypertension    Lung nodule    7 mm LUL nodule   Past Surgical History:  Procedure Laterality Date   IR RADIOLOGIST EVAL & MGMT  07/02/2021   IR RADIOLOGIST EVAL & MGMT  07/22/2021   Patient Active Problem List   Diagnosis Date Noted   Status post stroke due to cerebrovascular disease    Cerebral edema (HCC) 04/29/2021   Stroke (cerebrum) (HCC) 04/29/2021   Right sided weakness    Acute CVA (cerebrovascular accident) (HCC) 04/27/2021   Primary osteoarthritis of left knee 01/13/2019   Primary osteoarthritis of right knee 01/13/2019   Flat foot 11/28/2017   Tendonitis of foot 11/28/2017   Polycythemia 05/12/2017   Vitamin D deficiency 04/21/2017   Bony sclerosis 08/05/2016   Lung nodule    Chronic pain of both knees 12/05/2015   Gastroesophageal reflux disease without esophagitis 05/17/2014   Essential hypertension 05/17/2014   Coronary artery calcification 08/17/2012   Abnormal LFTs (liver function tests) 07/11/2012   Chronic abdominal pain 06/24/2012   Right-sided chest wall pain 06/24/2012   Constipation, chronic 06/24/2012   Hyperbilirubinemia 06/24/2012   Chronic back pain 06/24/2012   DDD (degenerative disc disease), thoracolumbar 06/24/2012   History of nephrolithiasis 06/24/2012   Side pain 06/24/2012   Low back pain at multiple sites  06/24/2012    PCP: Odette [facility]s [place]  REFERRING PROVIDER: Richardean Canal  REFERRING DIAG: M25.511  THERAPY DIAG:  Cerebrovascular accident (CVA) due to embolism of left middle cerebral artery (HCC)  Acute pain of right shoulder  Status post stroke due to cerebrovascular disease  Abnormal posture  Chronic bilateral low back pain without sciatica  Rationale for Evaluation and Treatment Rehabilitation  ONSET DATE: 07/15/21  SUBJECTIVE:  SUBJECTIVE STATEMENT: Had a stroke in March while on the plane, 2 days after coming home he fell going to the bathroom. He stayed in hospital for 24 days and was getting therapy 3hrs a day. He was also getting PT/OT at home but was not getting improvements. He is having pain in R knee and hip but most of the problem is in the R shoulder and hand.   PERTINENT HISTORY: Acute CVA 3/19  PAIN:  Are you having pain? Yes: NPRS scale: 8/10 Pain location: R shoulder Pain description: sharp Aggravating factors: movement, sleeping on R  Relieving factors: Tylenol and meloxicam   PRECAUTIONS: Fall  WEIGHT BEARING RESTRICTIONS No  FALLS:  Has patient fallen in last 6 months? Yes. Number of falls 4  LIVING ENVIRONMENT: Lives with: lives with their family Lives in: House/apartment Stairs: Yes: External: 2 steps; none Has following equipment at home: Single point cane, Hemi walker, Wheelchair (manual), and Grab bars  OCCUPATION: retired  PLOF: Independent  PATIENT GOALS try to get stronger, try to use my legs and feet   OBJECTIVE:   DIAGNOSTIC FINDINGS:  Right shoulder 3 views: Shoulder is well located.  Subacromial space well-maintained.  Type II downsloping acromion.  No acute fractures or acute findings Right wrist 3 views: No acute fractures.  No subluxation  dislocation of  the carpal bones.  Wrist joints well-maintained.  No bony abnormalities.  PATIENT SURVEYS:  FOTO 4  COGNITION:  Overall cognitive status: Within functional limits for tasks assessed     SENSATION: WFL  POSTURE: Decreased R shoulder height, hemiparesis, forward head, flexed trunk, R lateral lean  UPPER EXTREMITY ROM:   Passive ROM Right eval Left eval  Shoulder flexion 60d w/pain   Shoulder extension    Shoulder abduction 80d w/pain    Shoulder adduction    Shoulder internal rotation Full w/end range pain   Shoulder external rotation 40d w/pain   Elbow flexion full   Elbow extension full   Wrist flexion    Wrist extension    Wrist ulnar deviation    Wrist radial deviation    Wrist pronation    Wrist supination    (Blank rows = not tested)  UPPER EXTREMITY MMT: L WFL  MMT Right eval Left eval  Shoulder flexion 1   Shoulder extension    Shoulder abduction    Shoulder adduction 2-   Shoulder internal rotation 2-   Shoulder external rotation 2-   Middle trapezius    Lower trapezius    Elbow flexion 2+   Elbow extension 2-   Wrist flexion    Wrist extension    Wrist ulnar deviation    Wrist radial deviation    Wrist pronation    Wrist supination    Grip strength (lbs)    (Blank rows = not tested)  LE MMT 3/5 hip flexion, knee ext, knee flexion  Using hemiwalker, decrease R foot clearance, R foot ER, decrease hip flexion, decreased stance time on R LE, decreased step length, foot drop  SHOULDER SPECIAL TESTS:  Impingement tests: Neer impingement test: positive , Hawkins/Kennedy impingement test: positive , and Painful arc test: positive     Instability tests: Apprehension test: positive  and Sulcus sign: positive   JOINT MOBILITY TESTING:  Pain with PROM, muscle guarding, instability in R shoulder, scapular winging  PALPATION:  No TTP    TODAY'S TREATMENT:  Tried pulleys (discontinued) ER w/yellow band   PATIENT  EDUCATION: Education details: POC Person educated: Patient  and brother Education method: Explanation and Demonstration Education comprehension: verbalized understanding   HOME EXERCISE PROGRAM: Sidelying ER Yellow TB ER  ASSESSMENT:  CLINICAL IMPRESSION: Patient is a 66 y.o. male who was seen today for physical therapy evaluation and treatment post CVA in March. He presents with R sided hemiplegia and weakness. Patient has decreased stability and mobility in R shoulder and is at risk for subluxations. He presents with weakness and ROM deficits.  We attempted pulleys put patient has decreased grip strength and due to instbaility in R shoulder it is not safe to continue with this exercise.  He has decreased safety awareness and will leave hemi-walker and furniture walk. He states he sometimes does not use it at home but also reports many falls. I am concerned with his gait, safety awareness ,and LE functionality and spoke to our OT to try to message the doctor for a referral for gait and balance while she will work on his UE deficits. During today's evaluation I also quickly examined gait and strength in R LE and think he would benefit from PT to do gait training especially after he gets his AFO next week.     OBJECTIVE IMPAIRMENTS Abnormal gait, decreased mobility, difficulty walking, decreased ROM, decreased strength, decreased safety awareness, and pain.   ACTIVITY LIMITATIONS carrying, lifting, transfers, bathing, toileting, dressing, reach over head, and locomotion level  PARTICIPATION LIMITATIONS: cleaning, laundry, driving, community activity, and yard work  PERSONAL FACTORS Age, Time since onset of injury/illness/exacerbation, and 3+ comorbidities: chronic pain, acute CVA, stroke, OA  are also affecting patient's functional outcome.   REHAB POTENTIAL: Fair due to time since stroke and increased deficits on R side  CLINICAL DECISION MAKING: Evolving/moderate complexity  EVALUATION  COMPLEXITY: Moderate   GOALS: Goals reviewed with patient? No  SHORT TERM GOALS: Target date: 09/08/21 Patient will be independent with initial HEP.  Goal status: INITIAL   LONG TERM GOALS: Target date: 10/06/21  1.  Patient will report 75% improvement in R shoulder pain to improve QOL.  Goal status: INITIAL  2.  Patient to demonstrate improved upright posture with posterior shoulder girdle engaged to promote improved glenohumeral joint mobility. Baseline: decreased shoulder height on R side, decreased shoulder stability, scapular winging  Goal status: INITIAL  3.  Patient to improve R shoulder AROM to  within patient's functional norm  without pain provocation to allow for increased ease of ADLs.  Baseline: measured w/PROM Goal status: INITIAL  4.  Patient will demonstrate improved functional UE strength as demonstrated by >= 3+/5 in all muscle groups . Goal status: INITIAL  5.  Patient will report 43 on FOTO(patient outcome measure)  to demonstrate improved functional ability.  Baseline: 4 Goal status: INITIAL    PLAN: PT FREQUENCY: 3x/week  PT DURATION: 8 weeks  PLANNED INTERVENTIONS: Therapeutic exercises, Therapeutic activity, Neuromuscular re-education, Balance training, Gait training, Patient/Family education, Joint mobilization, Stair training, Orthotic/Fit training, and Manual therapy  PLAN FOR NEXT SESSION: if referral doesn't change work on PROM for R shoulder and low level strengthening.    [place], [place] 08/04/2021, 5:02 PM

## 2021-08-05 ENCOUNTER — Encounter: Payer: Self-pay | Admitting: Occupational Therapy

## 2021-08-05 ENCOUNTER — Other Ambulatory Visit: Payer: Self-pay

## 2021-08-05 ENCOUNTER — Ambulatory Visit: Payer: Medicare Other | Admitting: Occupational Therapy

## 2021-08-05 ENCOUNTER — Ambulatory Visit: Payer: Medicaid Other | Admitting: Family Medicine

## 2021-08-05 ENCOUNTER — Ambulatory Visit: Payer: Medicare Other

## 2021-08-05 DIAGNOSIS — R2689 Other abnormalities of gait and mobility: Secondary | ICD-10-CM | POA: Diagnosis not present

## 2021-08-05 DIAGNOSIS — I63412 Cerebral infarction due to embolism of left middle cerebral artery: Secondary | ICD-10-CM

## 2021-08-05 DIAGNOSIS — M6281 Muscle weakness (generalized): Secondary | ICD-10-CM

## 2021-08-05 DIAGNOSIS — M25511 Pain in right shoulder: Secondary | ICD-10-CM | POA: Diagnosis not present

## 2021-08-05 DIAGNOSIS — G8929 Other chronic pain: Secondary | ICD-10-CM

## 2021-08-05 DIAGNOSIS — R293 Abnormal posture: Secondary | ICD-10-CM

## 2021-08-05 DIAGNOSIS — M25531 Pain in right wrist: Secondary | ICD-10-CM | POA: Diagnosis not present

## 2021-08-05 DIAGNOSIS — M545 Low back pain, unspecified: Secondary | ICD-10-CM

## 2021-08-05 DIAGNOSIS — Z8673 Personal history of transient ischemic attack (TIA), and cerebral infarction without residual deficits: Secondary | ICD-10-CM | POA: Diagnosis not present

## 2021-08-05 DIAGNOSIS — I69351 Hemiplegia and hemiparesis following cerebral infarction affecting right dominant side: Secondary | ICD-10-CM

## 2021-08-05 DIAGNOSIS — R29818 Other symptoms and signs involving the nervous system: Secondary | ICD-10-CM

## 2021-08-05 NOTE — Therapy (Signed)
OUTPATIENT OCCUPATIONAL THERAPY TREATMENT NOTE  Patient Name: Aaron Dennis MRN: [id number] DOB:1955/06/28, 66 y.o., male Today's Date: 08/05/2021  PCP: Charlott Rakes, MD REFERRING PROVIDER: Pete Pelt, PA-C   OT End of Session - 08/05/21 1729     Visit Number 2    Number of Visits 25    Date for OT Re-Evaluation 10/03/21    Authorization Type UHC Medicare; Medicaid [id number] (secondary)    Authorization Time Period VL: [place]    Progress Note Due on Visit 10    OT Start Time 1705    OT Stop Time 1745   OT Time Calculation (min) 40 min    Activity Tolerance Patient tolerated treatment well    Behavior During Therapy WFL for tasks assessed/performed            Past Medical History:  Diagnosis Date   GERD (gastroesophageal reflux disease)    Hypertension    Lung nodule    7 mm LUL nodule   Past Surgical History:  Procedure Laterality Date   IR RADIOLOGIST EVAL & MGMT  07/02/2021   IR RADIOLOGIST EVAL & MGMT  07/22/2021   Patient Active Problem List   Diagnosis Date Noted   Status post stroke due to cerebrovascular disease    Cerebral edema ([place]) 04/29/2021   Stroke (cerebrum) ([place]) 04/29/2021   Right sided weakness    Acute CVA (cerebrovascular accident) ([place]) 04/27/2021   Primary osteoarthritis of left knee 01/13/2019   Primary osteoarthritis of right knee 01/13/2019   Flat foot 11/28/2017   Tendonitis of foot 11/28/2017   Polycythemia 05/12/2017   Vitamin D deficiency 04/21/2017   Bony sclerosis 08/05/2016   Lung nodule    Chronic pain of both knees 12/05/2015   Gastroesophageal reflux disease without esophagitis 05/17/2014   Essential hypertension 05/17/2014   Coronary artery calcification 08/17/2012   Abnormal LFTs (liver function tests) 07/11/2012   Chronic abdominal pain 06/24/2012   Right-sided chest wall pain 06/24/2012   Constipation, chronic 06/24/2012   Hyperbilirubinemia 06/24/2012   Chronic back pain 06/24/2012   DDD (degenerative disc disease),  thoracolumbar 06/24/2012   History of nephrolithiasis 06/24/2012   Side pain 06/24/2012   Low back pain at multiple sites 06/24/2012    ONSET DATE: 07/17/21 (date of OT order)  REFERRING DIAG: M25.531 (ICD-10-CM) - Pain in right wrist M25.511 (ICD-10-CM) - Acute pain of right shoulder   THERAPY DIAG:  Hemiplegia and hemiparesis following cerebral infarction affecting right dominant side (HCC)  Muscle weakness (generalized)  Acute pain of right shoulder  Pain in right wrist  Other symptoms and signs involving the nervous system  Rationale for Evaluation and Treatment Rehabilitation  SUBJECTIVE:   SUBJECTIVE STATEMENT: "It feels tight" Pt accompanied by: self and family member (brother, Selinda Michaels)  PAIN: Are you having pain? No  PERTINENT HISTORY: L frontoparietal CVA w/ residual R-sided weakness, presented to ED 04/27/21 after recent prolonged travel; PMH includes h/o polycythemia, HTN  PRECAUTIONS: Fall  FALLS: Has patient fallen in last 6 months? Yes. Number of falls 3  PLOF: Independent and Vocation/Vocational requirements: retired Architect  PATIENT GOALS "Work on my shoulder"  OBJECTIVE:   UPPER EXTREMITY ROM: LUE WFL; RUE measurements below  Active ROM Right Eval - 6/26  Shoulder flexion Unable  Shoulder abduction Unable  Shoulder adduction WFL  Shoulder extension 24  Shoulder internal rotation [facility]  Shoulder external rotation Unable  Elbow flexion WFL  Elbow extension 33  Wrist flexion 27  Wrist extension 28  Wrist pronation To be assessed  Wrist supination To be assessed  (Blank rows = not tested)  UPPER EXTREMITY MMT:     MMT Right Eval - 6/26  Shoulder flexion 1/5  Shoulder abduction 1/5  Shoulder extension 3-/5  Shoulder internal rotation 2/5  Shoulder external rotation 2-/5  Elbow flexion 3/5  Elbow extension 2-/5  Wrist flexion 2-/5  Wrist extension 2-/5  Wrist pronation 2+/5  Wrist supination 2+/5  (Blank rows = not  tested)   TODAY'S TREATMENT - 08/07/21: Closed-chain chest press w/ unweighted dowel in supine; towel roll positioned under R upper arm for alignment in neutral. OT provided verbal cues to focus on activation of anterior deltoid during exercise vs compensating w/ abduction. Improved ROM observed w/ increased repetition AROM of R shoulder ER in supine w/ towel roll positioned under R upper arm for alignment. OT incorporated active-assist for facilitation of increased arc of motion prn; pt able to achieve 75% of ROM consistently AROM of R wrist extension in supine for gravity assisted movement completed x15. OT provided mod verbal cues for consistency w/ ROM; required occ support / cues to maintain RUE w/ elbow flexed during exercise RUE shoulder flex/ext completed w/ UE Ranger for improved GMC w/ gravity-min positioning; OT provided occ assist for stabilization and verbal/tactile cues for consistency w/ arc of motion and increased shoulder flex AROM of R wrist ext against gravity while seated; completed 2x10 w/ occ verbal cues for full arc of motion R forearm supination/pronation holding 1 lb dumbbell w/ OT facilitating hold on free weight and increased control, particularly w/ pronation; completed 2x10   PATIENT EDUCATION: Provided condition-specific education, particularly related to neuro reed and typical recovery patterns Person educated: Patient Education method: Explanation Education comprehension: verbalized understanding   HOME EXERCISE PROGRAM: Access Code: [email or web address] - Supine Shoulder Flexion AAROM with Hands Clasped  - 3 x daily - 7 x weekly - 1 sets - 10 reps - Seated Elbow Flexion and Extension AROM  - 3 x daily - 7 x weekly - 2 sets - 10 reps - Seated Forearm Pronation and Supination AROM  - 3 x daily - 7 x weekly - 2 sets - 10 reps - Wrist AROM Flexion Extension  - 3 x daily - 7 x weekly - 2 sets - 10 reps   GOALS: Goals reviewed with patient? Yes  SHORT TERM GOALS: Target  date: 09/01/21  STG  Status:  1 Pt will demonstrate understanding of initial HEP designed for RUE ROM Baseline: No HEP at this time Progressing  2 Pt to improve R shoulder flexion strength to at least 2-/5 (partial ROM, gravity min) Baseline: shoulder flex MMT: 1/5 Progressing  3 Pt will be able to grasp and lift lightweight object to mouth w/ R, dominant UE in at least 2 trials Baseline: Significantly limited gross grasp Progressing  4 Pt will achieve at least 50% of R wrist extension AROM against gravity Baseline: partial ROM in horizontal plane Progressing     LONG TERM GOALS: Target date: 10/03/21  LTG  Status:  1 Pt will improve FOTO score to at least 45 to indicate improved participation in functional activities Baseline: Intake - 4 Progressing  2 Pt will be able to achieve at least partial AROM of R shoulder flexion against gravity by discharge for improved functional use of RUE Baseline: shoulder flex MMT: 1/5 Progressing  3 Pt will be able to complete at least 10 blocks during Box and Blocks Test to indicated improved  unilateral GMC of R, dominant UE Baseline: Unable at time of evaluation Progressing  4 Pt will be able to demonstrate incorporation of RUE as gross assist in at least 1 functional task (pulling up pants, stabilizing plate, etc.) Baseline: Not incorporating LUE at gross level at time of eval Progressing     ASSESSMENT:  CLINICAL IMPRESSION: Session today focused on NMR of RUE [place], emphasizing preparatory exercises for functional movement (forearm sup, shoulder flex, elbow ext, and wrist ext). Pt required significant cues throughout session to incorporate RUE into activities and exercises w/out assist from LUE; corresponding education related to principles of neuroplasticity provided. Pt demonstrated most success in gravity-minimized positioning and may benefit from incorporating increased somatosensory input prn.  PERFORMANCE DEFICITS in functional skills including  ADLs, IADLs, coordination, dexterity, proprioception, sensation, tone, ROM, strength, pain, FMC, GMC, mobility, balance, body mechanics, decreased knowledge of use of DME, and UE functional use, cognitive skills including safety awareness, and psychosocial skills including environmental adaptation.   IMPAIRMENTS are limiting patient from ADLs, IADLs, rest and sleep, and work.   COMORBIDITIES may have co-morbidities  that affects occupational performance. Patient will benefit from skilled OT to address above impairments and improve overall function.   PLAN: OT FREQUENCY: 3x/week  OT DURATION: 8 weeks  PLANNED INTERVENTIONS: self care/ADL training, therapeutic exercise, therapeutic activity, neuromuscular re-education, manual therapy, passive range of motion, balance training, functional mobility training, aquatic therapy, splinting, electrical stimulation, ultrasound, iontophoresis, moist heat, cryotherapy, patient/family education, and DME and/or AE instructions  RECOMMENDED OTHER SERVICES: Currently receiving PT services at this location  CONSULTED AND AGREED WITH PLAN OF CARE: Patient and family member/caregiver  PLAN FOR NEXT SESSION: RUE [place] and exercises for scapular stability (e.g., supine, retraction/depression)   Kathrine Cords, MSOT, OTR/L 08/05/2021, 5:46 PM

## 2021-08-06 ENCOUNTER — Telehealth: Payer: Self-pay | Admitting: Family Medicine

## 2021-08-06 NOTE — Telephone Encounter (Signed)
Copied from [place] [phone]. Topic: General - Other >> Aug 05, 2021  3:28 PM Marcellus Scott wrote: Reason for CRM: Anselm Pancoast from [facility] Stated they discharged pt from home health on Friday and started outpatient yesterday.  Wanted to make PCP aware.

## 2021-08-07 ENCOUNTER — Encounter: Payer: Self-pay | Admitting: Physical Therapy

## 2021-08-07 ENCOUNTER — Encounter: Payer: Self-pay | Admitting: Occupational Therapy

## 2021-08-07 ENCOUNTER — Ambulatory Visit: Payer: Medicare Other | Admitting: Physical Therapy

## 2021-08-07 ENCOUNTER — Ambulatory Visit: Payer: Medicare Other | Admitting: Occupational Therapy

## 2021-08-07 DIAGNOSIS — M25531 Pain in right wrist: Secondary | ICD-10-CM

## 2021-08-07 DIAGNOSIS — R293 Abnormal posture: Secondary | ICD-10-CM | POA: Diagnosis not present

## 2021-08-07 DIAGNOSIS — Z8673 Personal history of transient ischemic attack (TIA), and cerebral infarction without residual deficits: Secondary | ICD-10-CM

## 2021-08-07 DIAGNOSIS — M6281 Muscle weakness (generalized): Secondary | ICD-10-CM

## 2021-08-07 DIAGNOSIS — R2689 Other abnormalities of gait and mobility: Secondary | ICD-10-CM

## 2021-08-07 DIAGNOSIS — R29818 Other symptoms and signs involving the nervous system: Secondary | ICD-10-CM

## 2021-08-07 DIAGNOSIS — G8929 Other chronic pain: Secondary | ICD-10-CM | POA: Diagnosis not present

## 2021-08-07 DIAGNOSIS — I63412 Cerebral infarction due to embolism of left middle cerebral artery: Secondary | ICD-10-CM | POA: Diagnosis not present

## 2021-08-07 DIAGNOSIS — I69351 Hemiplegia and hemiparesis following cerebral infarction affecting right dominant side: Secondary | ICD-10-CM

## 2021-08-07 DIAGNOSIS — M25511 Pain in right shoulder: Secondary | ICD-10-CM | POA: Diagnosis not present

## 2021-08-07 DIAGNOSIS — M545 Low back pain, unspecified: Secondary | ICD-10-CM | POA: Diagnosis not present

## 2021-08-07 NOTE — Therapy (Addendum)
OUTPATIENT OCCUPATIONAL THERAPY TREATMENT NOTE  Patient Name: Aaron Dennis MRN: [id number] DOB:26-Dec-1955, 66 y.o., male Today's Date: 08/07/2021  PCP: Charlott Rakes, MD REFERRING PROVIDER: Pete Pelt, PA-C   OT End of Session - 08/07/21 1415     Visit Number 3    Number of Visits 25    Date for OT Re-Evaluation 10/03/21    Authorization Type UHC Medicare; Medicaid [place] (secondary)    Authorization Time Period VL: [place]    Progress Note Due on Visit 10    OT Start Time 1402    OT Stop Time 1445    OT Time Calculation (min) 43 min    Activity Tolerance Patient tolerated treatment well    Behavior During Therapy WFL for tasks assessed/performed            Past Medical History:  Diagnosis Date   GERD (gastroesophageal reflux disease)    Hypertension    Lung nodule    7 mm LUL nodule   Past Surgical History:  Procedure Laterality Date   IR RADIOLOGIST EVAL & MGMT  07/02/2021   IR RADIOLOGIST EVAL & MGMT  07/22/2021   Patient Active Problem List   Diagnosis Date Noted   Status post stroke due to cerebrovascular disease    Cerebral edema ([place]) 04/29/2021   Stroke (cerebrum) ([place]) 04/29/2021   Right sided weakness    Acute CVA (cerebrovascular accident) ([place]) 04/27/2021   Primary osteoarthritis of left knee 01/13/2019   Primary osteoarthritis of right knee 01/13/2019   Flat foot 11/28/2017   Tendonitis of foot 11/28/2017   Polycythemia 05/12/2017   Vitamin D deficiency 04/21/2017   Bony sclerosis 08/05/2016   Lung nodule    Chronic pain of both knees 12/05/2015   Gastroesophageal reflux disease without esophagitis 05/17/2014   Essential hypertension 05/17/2014   Coronary artery calcification 08/17/2012   Abnormal LFTs (liver function tests) 07/11/2012   Chronic abdominal pain 06/24/2012   Right-sided chest wall pain 06/24/2012   Constipation, chronic 06/24/2012   Hyperbilirubinemia 06/24/2012   Chronic back pain 06/24/2012   DDD (degenerative disc disease),  thoracolumbar 06/24/2012   History of nephrolithiasis 06/24/2012   Side pain 06/24/2012   Low back pain at multiple sites 06/24/2012    ONSET DATE: 07/17/21 (date of OT order)  REFERRING DIAG: M25.531 (ICD-10-CM) - Pain in right wrist M25.511 (ICD-10-CM) - Acute pain of right shoulder   THERAPY DIAG:  Hemiplegia and hemiparesis following cerebral infarction affecting right dominant side (HCC)  Acute pain of right shoulder  Pain in right wrist  Muscle weakness (generalized)  Other symptoms and signs involving the nervous system  Rationale for Evaluation and Treatment Rehabilitation  SUBJECTIVE:   SUBJECTIVE STATEMENT: Pt reports he has been working on stacking cones at home, but often assists some w/ his LUE Pt accompanied by: self and family member (brother, Selinda Michaels)  PAIN: Are you having pain? No  PERTINENT HISTORY: L frontoparietal CVA w/ residual R-sided weakness, presented to ED 04/27/21 after recent prolonged travel; PMH includes h/o polycythemia, HTN  PRECAUTIONS: Fall  FALLS: Has patient fallen in last 6 months? Yes. Number of falls 3  PLOF: Independent and Vocation/Vocational requirements: retired Architect  PATIENT GOALS "Work on my shoulder"   OBJECTIVE:   UPPER EXTREMITY ROM: LUE WFL; RUE measurements below  Active ROM Right Eval - 6/26  Shoulder flexion Unable  Shoulder abduction Unable  Shoulder adduction WFL  Shoulder extension 24  Shoulder internal rotation [facility]  Shoulder external rotation Unable  Elbow flexion WFL  Elbow extension 33  Wrist flexion 27  Wrist extension 28  Wrist pronation To be assessed  Wrist supination To be assessed  (Blank rows = not tested)  UPPER EXTREMITY MMT:     MMT Right Eval - 6/26  Shoulder flexion 1/5  Shoulder abduction 1/5  Shoulder extension 3-/5  Shoulder internal rotation 2/5  Shoulder external rotation 2-/5  Elbow flexion 3/5  Elbow extension 2-/5  Wrist flexion 2-/5  Wrist extension 2-/5   Wrist pronation 2+/5  Wrist supination 2+/5  (Blank rows = not tested)   TODAY'S TREATMENT - 08/07/21: R hand grasp and release to stack therapy cones; incorporated forward functional reaching with min cueing/facilitation for control/stabilization of cones. Completed 2 sets of 5 cones w/ extended time and verbal cues to activate shoulder flexion and elbow extension. R forearm supination/pronation holding 1 lb dumbbell w/ hemi hand glove facilitating hold on free weight; completed x20 w/ occ tactile cues for full arc of motion R wrist extension against gravity holding 1 lb dumbbell w/ hemi hand glove facilitating hold on free weight; completed 2x10 w/ occ verbal cues for full arc of motion R shoulder circumduction w/ UE Ranger; completed 2x15 clockwise and 1x15 counterclockwise. OT provided mod verbal cues and occ visual/tactile cue to facilitate increased shoulder flexion, particularly w/ fatigue Closed-chain shoulder flexion holding 1 lb dowel completed 2x10 w/ forearms supinated while sitting upright; OT positioned pt w/ elbow flexed to increase engagement of anterior deltoid for shoulder flexion w/ positive results. Also attempted exercise w/ forearms pronated w/ less success and returned to initial positioning. Pt able to achieve about 40% of arc of motion consistently Closed-chain AAROM of shoulder flexion to slightly below chest height w/ hands clasped completed 10x sitting upright in chair. Palpated crepitus though pt reported no increased pain; education provided on not completing exercise at home unless supine and not flexing shoulder past 90 deg   PATIENT EDUCATION: Ongoing condition-specific education; emphasizing importance of using RUE as much as possible and avoiding compensation w/ LUE Person educated: Patient Education method: Explanation Education comprehension: verbalized understanding   HOME EXERCISE PROGRAM: Access Code: [email or web address] - Supine Shoulder Flexion AAROM with Hands  Clasped  - 3 x daily - 7 x weekly - 1 sets - 10 reps - Seated Elbow Flexion and Extension AROM  - 3 x daily - 7 x weekly - 2 sets - 10 reps - Seated Forearm Pronation and Supination AROM  - 3 x daily - 7 x weekly - 2 sets - 10 reps - Wrist AROM Flexion Extension  - 3 x daily - 7 x weekly - 2 sets - 10 reps   GOALS: Goals reviewed with patient? Yes  SHORT TERM GOALS: Target date: 09/01/21  STG  Status:  1 Pt will demonstrate understanding of initial HEP designed for RUE ROM Baseline: No HEP at this time Progressing  2 Pt to improve R shoulder flexion strength to at least 2-/5 (partial ROM, gravity min) Baseline: shoulder flex MMT: 1/5 Progressing  3 Pt will be able to grasp and lift lightweight object to mouth w/ R, dominant UE in at least 2 trials Baseline: Significantly limited gross grasp Progressing  4 Pt will achieve at least 50% of R wrist extension AROM against gravity Baseline: partial ROM in horizontal plane Progressing     LONG TERM GOALS: Target date: 10/03/21  LTG  Status:  1 Pt will improve FOTO score to at least 45 to indicate improved participation  in functional activities Baseline: Intake - 4 Progressing  2 Pt will be able to achieve at least partial AROM of R shoulder flexion against gravity by discharge for improved functional use of RUE Baseline: shoulder flex MMT: 1/5 Progressing  3 Pt will be able to complete at least 10 blocks during Box and Blocks Test to indicated improved unilateral GMC of R, dominant UE Baseline: Unable at time of evaluation Progressing  4 Pt will be able to demonstrate incorporation of RUE as gross assist in at least 1 functional task (pulling up pants, stabilizing plate, etc.) Baseline: Not incorporating LUE at gross level at time of eval Progressing     ASSESSMENT:  CLINICAL IMPRESSION: Session today focused on functional grasp and release and activation of weaker muscle groups. OT included light resistance (1 lb dumbbell) for increased  somatosensory input during supination, wrist extension, and shoulder flexion w/ pt demonstrating good AROM. Pt continues to benefit from cues to incorporate RUE into activities and exercises w/out assist from LUE. Scapular winging observed during shoulder flexion w/out back support; scapular and shoulder stability to be addressed next session.  PERFORMANCE DEFICITS in functional skills including ADLs, IADLs, coordination, dexterity, proprioception, sensation, tone, ROM, strength, pain, FMC, GMC, mobility, balance, body mechanics, decreased knowledge of use of DME, and UE functional use, cognitive skills including safety awareness, and psychosocial skills including environmental adaptation.   IMPAIRMENTS are limiting patient from ADLs, IADLs, rest and sleep, and work.   COMORBIDITIES may have co-morbidities  that affects occupational performance. Patient will benefit from skilled OT to address above impairments and improve overall function.   PLAN: OT FREQUENCY: 3x/week  OT DURATION: 8 weeks  PLANNED INTERVENTIONS: self care/ADL training, therapeutic exercise, therapeutic activity, neuromuscular re-education, manual therapy, passive range of motion, balance training, functional mobility training, aquatic therapy, splinting, electrical stimulation, ultrasound, iontophoresis, moist heat, cryotherapy, patient/family education, and DME and/or AE instructions  RECOMMENDED OTHER SERVICES: Currently receiving PT services at this location  CONSULTED AND AGREED WITH PLAN OF CARE: Patient and family member/caregiver  PLAN FOR NEXT SESSION: Scapular stability (e.g., supine, retraction/depression, rows); continue to address NMR of RUE [place] and functional grasp and release   Kathrine Cords, MSOT, OTR/L 08/07/2021, 5:17 PM

## 2021-08-07 NOTE — Telephone Encounter (Signed)
FYI

## 2021-08-07 NOTE — Therapy (Signed)
OUTPATIENT PHYSICAL THERAPY TREATMENT   Patient Name: Aaron Dennis MRN: [id number] DOB:[birth year], 66 y.o., male Today's Date: 08/07/2021   PT End of Session - 08/07/21 1500     Visit Number 3    Date for PT Re-Evaluation 10/06/21    Authorization Type [employer] Medicare    Progress Note Due on Visit 10    PT Start Time 1433    PT Stop Time 1511    PT Time Calculation (min) 38 min    Activity Tolerance Patient tolerated treatment well;Patient limited by pain    Behavior During Therapy [facility] for tasks assessed/performed               Past Medical History:  Diagnosis Date   GERD (gastroesophageal reflux disease)    Hypertension    Lung nodule    7 mm LUL nodule   Past Surgical History:  Procedure Laterality Date   IR RADIOLOGIST EVAL & MGMT  07/02/2021   IR RADIOLOGIST EVAL & MGMT  07/22/2021   Patient Active Problem List   Diagnosis Date Noted   Status post stroke due to cerebrovascular disease    Cerebral edema ([place]) 04/29/2021   Stroke (cerebrum) ([place]) 04/29/2021   Right sided weakness    Acute CVA (cerebrovascular accident) ([place]) 04/27/2021   Primary osteoarthritis of left knee 01/13/2019   Primary osteoarthritis of right knee 01/13/2019   Flat foot 11/28/2017   Tendonitis of foot 11/28/2017   Polycythemia 05/12/2017   Vitamin D deficiency 04/21/2017   Bony sclerosis 08/05/2016   Lung nodule    Chronic pain of both knees 12/05/2015   Gastroesophageal reflux disease without esophagitis 05/17/2014   Essential hypertension 05/17/2014   Coronary artery calcification 08/17/2012   Abnormal LFTs (liver function tests) 07/11/2012   Chronic abdominal pain 06/24/2012   Right-sided chest wall pain 06/24/2012   Constipation, chronic 06/24/2012   Hyperbilirubinemia 06/24/2012   Chronic back pain 06/24/2012   DDD (degenerative disc disease), thoracolumbar 06/24/2012   History of nephrolithiasis 06/24/2012   Side pain 06/24/2012   Low back pain at multiple sites  06/24/2012    PCP: [name]  REFERRING PROVIDER: [name]  REFERRING DIAG: M25.511  THERAPY DIAG:  Hemiplegia and hemiparesis following cerebral infarction affecting right dominant side (HCC)  Other abnormalities of gait and mobility  Muscle weakness (generalized)  Cerebrovascular accident (CVA) due to embolism of left middle cerebral artery (HCC)  Abnormal posture  Status post stroke due to cerebrovascular disease  Rationale for Evaluation and Treatment Rehabilitation  ONSET DATE: 07/15/21  SUBJECTIVE:  SUBJECTIVE STATEMENT: Patient is doing well and will get AFO hopefully by next week. He reports pain in R knee and hip mostly when walking.   PERTINENT HISTORY: Acute CVA 3/19  PAIN:  Are you having pain? Yes: NPRS scale: 6/10 Pain location: R shoulder Pain description: sharp Aggravating factors: movement, sleeping on R  Relieving factors: Tylenol and meloxicam   PRECAUTIONS: Fall  WEIGHT BEARING RESTRICTIONS No  FALLS:  Has patient fallen in last 6 months? Yes. Number of falls 4  LIVING ENVIRONMENT: Lives with: lives with their family Lives in: House/apartment Stairs: Yes: External: 2 steps; none Has following equipment at home: Single point cane, Hemi walker, Wheelchair (manual), and Grab bars  OCCUPATION: retired  PLOF: [employer] try to get stronger, try to use my legs and feet   OBJECTIVE:   DIAGNOSTIC FINDINGS:  Right shoulder 3 views: Shoulder is well located.  Subacromial space well-maintained.  Type II downsloping acromion.  No acute fractures or acute findings Right wrist 3 views: No acute fractures.  No subluxation dislocation of  the carpal bones.  Wrist joints well-maintained.  No bony abnormalities.  PATIENT SURVEYS:  FOTO  4  COGNITION:  Overall cognitive status: Within functional limits for tasks assessed     SENSATION: WFL  POSTURE: Decreased R shoulder height, hemiparesis, forward head, flexed trunk, R lateral lean  UPPER EXTREMITY ROM:   Passive ROM Right eval Left eval  Shoulder flexion 60d w/pain   Shoulder extension    Shoulder abduction 80d w/pain    Shoulder adduction    Shoulder internal rotation Full w/end range pain   Shoulder external rotation 40d w/pain   Elbow flexion full   Elbow extension full   Wrist flexion    Wrist extension    Wrist ulnar deviation    Wrist radial deviation    Wrist pronation    Wrist supination    (Blank rows = not tested)  UPPER EXTREMITY MMT: L WFL  MMT Right eval Left eval  Shoulder flexion 1   Shoulder extension    Shoulder abduction    Shoulder adduction 2-   Shoulder internal rotation 2-   Shoulder external rotation 2-   Middle trapezius    Lower trapezius    Elbow flexion 2+   Elbow extension 2-   Wrist flexion    Wrist extension    Wrist ulnar deviation    Wrist radial deviation    Wrist pronation    Wrist supination    Grip strength (lbs)    (Blank rows = not tested)  LE MMT 3/5 hip flexion, knee ext, knee flexion  Using hemiwalker, decrease R foot clearance, R foot ER, decrease hip flexion, decreased stance time on R LE, decreased step length, foot drop  SHOULDER SPECIAL TESTS:  Impingement tests: Neer impingement test: positive , Hawkins/Kennedy impingement test: positive , and Painful arc test: positive     Instability tests: Apprehension test: positive  and Sulcus sign: positive   JOINT MOBILITY TESTING:  Pain with PROM, muscle guarding, instability in R shoulder, scapular winging  PALPATION:  No TTP   FUNCTIONAL MEASURES BERG 24/56 TUG 57.28s--slowed gait using hemiwalker, no foot clearance and decreased safety awareness esp with turns 5xSTS-- 25.72s, LUE push off, decreased control  w/descending   TODAY'S  TREATMENT:  08/07/21 Amb with shopping cart 1 x 75'. Patient demonstrated step through gait, RLE with increased flexor moment at hip and knee in stance, but stable. Nu-Step 3 min at L4,  then 3 x 30 sec at L7, legs only, focused push. Heel raises with BUE support on shopping cart. Step ups on 4" step, BUE support LLE step from floor to third step an dback, x 10 STM to ITB on R due to report of R knee pain. F/B stretch to ITB. L sidelie  with R hip abd, moving R hip forward and back while holding the abd. Supine U bridge on R.     BERG TUG  5xSTS  Nustep L2, x47mn  Hamstring curls redTB 2x10  LAQ 2x10 Heel taps 4" 2HHA support by stairs  PATIENT EDUCATION: Education details: POC Person educated: Patient and brother Education method: ECustomer service managerEducation comprehension: verbalized understanding   HOME EXERCISE PROGRAM: TBD for new referral  ASSESSMENT:  CLINICAL IMPRESSION:   Patient was seen for NM re-ed to R LE and trunk. He participated well, demonstrates fair strength on R, decreased control. Patient C/O R lateral knee pain- therapist performed STM to ITB with stretch   OBJECTIVE IMPAIRMENTS Abnormal gait, decreased mobility, difficulty walking, decreased ROM, decreased strength, decreased safety awareness, and pain.   ACTIVITY LIMITATIONS carrying, lifting, transfers, bathing, toileting, dressing, reach over head, and locomotion level  PARTICIPATION LIMITATIONS: cleaning, laundry, driving, community activity, and yard work  PERSONAL FACTORS Age, Time since onset of injury/illness/exacerbation, and 3+ comorbidities: chronic pain, acute CVA, stroke, OA  are also affecting patient's functional outcome.   REHAB POTENTIAL: Fair due to time since stroke and increased deficits on R side  CLINICAL DECISION MAKING: Evolving/moderate complexity  EVALUATION COMPLEXITY: Moderate   GOALS: Goals reviewed with patient? No  SHORT TERM GOALS: Target date:  09/08/21 Patient will be independent with initial HEP.  Goal status: ongoing  2.  Patient will be educated on strategies to decrease risk of falls.  Goal status: ongoing   LONG TERM GOALS: Target date: 10/06/21  1.  Patient will report 75% improvement in R shoulder pain to improve QOL.  Goal status: INITIAL  2.  Patient to demonstrate improved upright posture with posterior shoulder girdle engaged to promote improved glenohumeral joint mobility. Baseline: decreased shoulder height on R side, decreased shoulder stability, scapular winging  Goal status: INITIAL  3.  Patient to improve R shoulder AROM to  within patient's functional norm  without pain provocation to allow for increased ease of ADLs.  Baseline: measured w/PROM Goal status: INITIAL  4.  Patient will demonstrate improved functional UE and LE strength as demonstrated by >= 4/5 in all muscle groups . Goal status: INITIAL  5.  Patient will report 467on FOTO(patient outcome measure)  to demonstrate improved functional ability.  Baseline: 4 Goal status: INITIAL  6. Patient will score 34 or greater on Berg Balance test to demonstrate lower risk of falls. (MCID= 8 points) .  Baseline: 24 Goal status: INITIAL  7. Patient will demonstrate decreased fall risk by scoring < 25 sec on TUG and <20 on 5xSTS. Baseline: 57.28s, 25.72s Goal status: INITIAL     PLAN: PT FREQUENCY: 3x/week  PT DURATION: 8 weeks  PLANNED INTERVENTIONS: Therapeutic exercises, Therapeutic activity, Neuromuscular re-education, Balance training, Gait training, Patient/Family education, Joint mobilization, Stair training, Orthotic/Fit training, and Manual therapy  PLAN FOR NEXT SESSION: if referral doesn't change work on PROM for R shoulder and low level strengthening.    SMarcelina Morel DPT 08/07/2021, 3:28 PM

## 2021-08-11 LAB — SURGICAL PATHOLOGY

## 2021-08-13 ENCOUNTER — Other Ambulatory Visit: Payer: Self-pay

## 2021-08-13 ENCOUNTER — Encounter: Payer: Medicare Other | Attending: Physical Medicine & Rehabilitation | Admitting: Physical Medicine and Rehabilitation

## 2021-08-13 ENCOUNTER — Encounter: Payer: Self-pay | Admitting: Physical Medicine and Rehabilitation

## 2021-08-13 VITALS — BP 114/76 | HR 76 | Ht 70.0 in | Wt 157.6 lb

## 2021-08-13 DIAGNOSIS — R531 Weakness: Secondary | ICD-10-CM | POA: Diagnosis not present

## 2021-08-13 DIAGNOSIS — I63412 Cerebral infarction due to embolism of left middle cerebral artery: Secondary | ICD-10-CM | POA: Insufficient documentation

## 2021-08-13 DIAGNOSIS — I671 Cerebral aneurysm, nonruptured: Secondary | ICD-10-CM | POA: Insufficient documentation

## 2021-08-13 MED ORDER — BACLOFEN 10 MG PO TABS
5.0000 mg | ORAL_TABLET | Freq: Three times a day (TID) | ORAL | 5 refills | Status: DC
Start: 2021-08-13 — End: 2021-11-06
  Filled 2021-08-13: qty 130, 87d supply, fill #0

## 2021-08-13 NOTE — Patient Instructions (Signed)
Pt is a 66 yr old male with Acute L frontoparietal infarct and R hemiparesis from 3/23- RUE much worse affected- also associated R shoulder pain due to subluxation. Also has hx of polycythemia, BPH, HTN.  Spasticity- developing significant spasticity of RUE- is normal- 40-50% of stroke will develop spasticity- and it can get worse for up to 2 years after the stroke-need to treat so doesn't develop a muscle contracture which would make arm stuck in 1 position   Start Baclofen - 5 mg 3x/day x 1 week, then 10 mg 3x/day x1 week; can increase to 15 mg 3x/day- if required.   4. Have to do Range of motion 3-4x/day- so went over/demonstrated the range of motion exercises- esp shoulder internal and external rotation. Since losing range fast.   5. Will get pt back in ASAP for Botox of RUE- will ikley need 200-300 units for R shoulder and R elbow  6. Con't outpatient PT and OT-  for gait as well as OT for RUE/spasticity and return hopefully of RUE.    7. Can travel now- needs to move aorund every 2 hours on plane or in car-    8. I would agree with Dr Patrecia Pour- in doing arteriogram to try and deal with other aneurysm- but wait until more recovery from PT and OT.   9. F/U in 3 months and see Dr Letta Pate for Botox/botulinum toxin for RUE.

## 2021-08-13 NOTE — Progress Notes (Signed)
Subjective:    Patient ID: Aaron Dennis, male    DOB: 1955-04-06, 66 y.o.   MRN: [id number]  HPI Pt is a 66 yr old male with Acute L frontoparietal infarct and R hemiparesis from 3/23- RUE much worse affected- also associated R shoulder pain due to subluxation. Also has hx of polycythemia, BPH, HTN.  Here for hospital f/u for stroke f/u.   Has gotten some movement in R shoulder.  Cannot move distally yet.   Also having R shoulder pain. Sharp 7/10- has pain with movement of RUE.   Walking with hemiwalker- and sometime sin house walks with nothing- doesn't need to do furniture walking in home.   Still doing PT  Went to [facility]- transferred to [place]- started last week- finished H/H.  Saw [facility] for R shoulder pain- was told there wasn't any bone issues causing pain.   Also getting more muscle tightness- in RUE.     Pain Inventory Average Pain 7 Pain Right Now 7 My pain is intermittent and sharp  LOCATION OF PAIN  shoulder, hand, arm  BOWEL Number of stools per week: 3 Oral laxative use No  Type of laxative . Enema or suppository use No  History of colostomy No  Incontinent No   BLADDER Normal In and out cath, frequency . Able to self cath  . Bladder incontinence No  Frequent urination No  Leakage with coughing No  Difficulty starting stream No  Incomplete bladder emptying No    Mobility use a walker how many minutes can you walk? 10 ability to climb steps?  yes do you drive?  no  Function retired  Neuro/Psych weakness tremor trouble walking  Prior Studies Any changes since last visit?  no  Physicians involved in your care Any changes since last visit?  no   Family History  Problem Relation Age of Onset   Healthy Mother    Diabetes Brother    Colon polyps Neg Hx    Colon cancer Neg Hx    Esophageal cancer Neg Hx    Rectal cancer Neg Hx    Stomach cancer Neg Hx    Social History   Socioeconomic History   Marital  status: Married    Spouse name: Not on file   Number of children: Not on file   Years of education: Not on file   Highest education level: Not on file  Occupational History   Not on file  Tobacco Use   Smoking status: Former   Smokeless tobacco: Never   Tobacco comments:    quit Jan 2018  Vaping Use   Vaping Use: Never used  Substance and Sexual Activity   Alcohol use: No   Drug use: No   Sexual activity: Not Currently  Other Topics Concern   Not on file  Social History Narrative   Not on file   Social Determinants of Health   Financial Resource Strain: Not on file  Food Insecurity: Not on file  Transportation Needs: Not on file  Physical Activity: Not on file  Stress: Not on file  Social Connections: Not on file   Past Surgical History:  Procedure Laterality Date   IR RADIOLOGIST EVAL & MGMT  07/02/2021   IR RADIOLOGIST EVAL & MGMT  07/22/2021   Past Medical History:  Diagnosis Date   GERD (gastroesophageal reflux disease)    Hypertension    Lung nodule    7 mm LUL nodule   BP 114/76  Pulse 76   Ht '5\' 10"'$  (1.778 m)   Wt 157 lb 9.6 oz (71.5 kg)   SpO2 97%   BMI 22.61 kg/m   Opioid Risk Score:   Fall Risk Score:  `1  Depression screen [facility] 2/9     08/13/2021    9:45 AM 06/30/2021   11:26 AM 06/11/2021   10:21 AM 08/14/2020    2:48 PM 06/25/2020   10:17 AM 11/29/2018    2:05 PM 05/17/2014    2:51 PM  Depression screen PHQ 2/9  Decreased Interest 0 1 1 0 0 0 0  Down, Depressed, Hopeless 0 2 1 0 1 0 0  PHQ - 2 Score 0 3 2 0 1 0 0  Altered sleeping  2 1 0 0 0   Tired, decreased energy  2 0 0 0 0   Change in appetite  2 0 0 0 0   Feeling bad or failure about yourself   '2 1 1 '$ 0 0   Trouble concentrating  '2 1 1 '$ 0 0   Moving slowly or fidgety/restless  '3 2 1 '$ 0 0   Suicidal thoughts  0 0 0 0 0   PHQ-9 Score  '16 7 3 1 '$ 0      Review of Systems  Musculoskeletal:        Shoulder, hand and arm pain  Neurological:  Positive for tremors and weakness.  All other  systems reviewed and are negative.     Objective:   Physical Exam Awake, alert, using hemiwalker to walk; accompanied by cousin, NAD  MS: RUE deltoid 2/5; biceps 2+/5; Triceps 2+/5; WE 2/5; Grip 3-/5; and FA 2+/5 LUE 5/5 RLE- 5-/5 in HF/KE/DF and PF LLE 5/5  Neuro: MAS of 3 in R shoulder- esp MAS of 4 in external rotation with eliciting of pain most in ER ROM MAS of 2-3 in R elbow MAS of 1+ in R wrist and hand/fingers No increased tone in RLE Hoffman's RUE       Assessment & Plan:   Pt is a 66 yr old male with Acute L frontoparietal infarct and R hemiparesis from 3/23- RUE much worse affected- also associated R shoulder pain due to subluxation. Also has hx of polycythemia, BPH, HTN.  Spasticity- developing significant spasticity of RUE- is normal- 40-50% of stroke will develop spasticity- and it can get worse for up to 2 years after the stroke-need to treat so doesn't develop a muscle contracture which would make arm stuck in 1 position   Start Baclofen - 5 mg 3x/day x 1 week, then 10 mg 3x/day x1 week; can increase to 15 mg 3x/day- if required.   4. Have to do Range of motion 3-4x/day- so went over/demonstrated the range of motion exercises- esp shoulder internal and external rotation. Since losing range fast.   5. Will get pt back in ASAP for Botox of RUE- will ikley need 200-300 units for R shoulder and R elbow  6. Con't outpatient PT and OT-  for gait as well as OT for RUE/spasticity and return hopefully of RUE.    7. Can travel now- needs to move aorund every 2 hours on plane or in car-    8. I would agree with Dr Patrecia Pour- in doing arteriogram to try and deal with other aneurysm- but wait until more recovery from PT and OT.   9. F/U in 3 months and see Dr Letta Pate for Botox/botulinum toxin for RUE.    I spent a  total of  32  minutes on total care today- >50% coordination of care and education on spasticity-

## 2021-08-14 ENCOUNTER — Other Ambulatory Visit: Payer: Self-pay

## 2021-08-14 ENCOUNTER — Encounter: Payer: Self-pay | Admitting: Physical Therapy

## 2021-08-14 ENCOUNTER — Encounter: Payer: Self-pay | Admitting: Occupational Therapy

## 2021-08-14 ENCOUNTER — Ambulatory Visit: Payer: Medicare Other | Admitting: Physical Therapy

## 2021-08-14 ENCOUNTER — Ambulatory Visit: Payer: Medicare Other | Attending: Physician Assistant | Admitting: Occupational Therapy

## 2021-08-14 DIAGNOSIS — M25531 Pain in right wrist: Secondary | ICD-10-CM

## 2021-08-14 DIAGNOSIS — Z8673 Personal history of transient ischemic attack (TIA), and cerebral infarction without residual deficits: Secondary | ICD-10-CM | POA: Insufficient documentation

## 2021-08-14 DIAGNOSIS — R2689 Other abnormalities of gait and mobility: Secondary | ICD-10-CM | POA: Insufficient documentation

## 2021-08-14 DIAGNOSIS — R293 Abnormal posture: Secondary | ICD-10-CM | POA: Insufficient documentation

## 2021-08-14 DIAGNOSIS — M545 Low back pain, unspecified: Secondary | ICD-10-CM | POA: Diagnosis not present

## 2021-08-14 DIAGNOSIS — M25511 Pain in right shoulder: Secondary | ICD-10-CM

## 2021-08-14 DIAGNOSIS — M6281 Muscle weakness (generalized): Secondary | ICD-10-CM

## 2021-08-14 DIAGNOSIS — I63412 Cerebral infarction due to embolism of left middle cerebral artery: Secondary | ICD-10-CM | POA: Insufficient documentation

## 2021-08-14 DIAGNOSIS — I69351 Hemiplegia and hemiparesis following cerebral infarction affecting right dominant side: Secondary | ICD-10-CM

## 2021-08-14 DIAGNOSIS — G8929 Other chronic pain: Secondary | ICD-10-CM | POA: Diagnosis not present

## 2021-08-14 DIAGNOSIS — R29818 Other symptoms and signs involving the nervous system: Secondary | ICD-10-CM

## 2021-08-14 NOTE — Therapy (Signed)
OUTPATIENT PHYSICAL THERAPY TREATMENT   Patient Name: Aaron Dennis MRN: [id number] DOB:1955/09/22, 66 y.o., male Today's Date: 08/14/2021   PT End of Session - 08/14/21 0856     Visit Number 4    Date for PT Re-Evaluation 10/06/21    PT Start Time 0856    PT Stop Time 0930    PT Time Calculation (min) 34 min    Activity Tolerance Patient tolerated treatment well;Patient limited by pain    Behavior During Therapy [facility] for tasks assessed/performed               Past Medical History:  Diagnosis Date   GERD (gastroesophageal reflux disease)    Hypertension    Lung nodule    7 mm LUL nodule   Past Surgical History:  Procedure Laterality Date   IR RADIOLOGIST EVAL & MGMT  07/02/2021   IR RADIOLOGIST EVAL & MGMT  07/22/2021   Patient Active Problem List   Diagnosis Date Noted   Aneurysm, cerebral, nonruptured 08/13/2021   Status post stroke due to cerebrovascular disease    Cerebral edema ([place]) 04/29/2021   Stroke (cerebrum) ([place]) 04/29/2021   Right sided weakness    Acute CVA (cerebrovascular accident) ([place]) 04/27/2021   Primary osteoarthritis of left knee 01/13/2019   Primary osteoarthritis of right knee 01/13/2019   Flat foot 11/28/2017   Tendonitis of foot 11/28/2017   Polycythemia 05/12/2017   Vitamin D deficiency 04/21/2017   Bony sclerosis 08/05/2016   Lung nodule    Chronic pain of both knees 12/05/2015   Gastroesophageal reflux disease without esophagitis 05/17/2014   Essential hypertension 05/17/2014   Coronary artery calcification 08/17/2012   Abnormal LFTs (liver function tests) 07/11/2012   Chronic abdominal pain 06/24/2012   Right-sided chest wall pain 06/24/2012   Constipation, chronic 06/24/2012   Hyperbilirubinemia 06/24/2012   Chronic back pain 06/24/2012   DDD (degenerative disc disease), thoracolumbar 06/24/2012   History of nephrolithiasis 06/24/2012   Side pain 06/24/2012   Low back pain at multiple sites 06/24/2012    PCP: Charlane Ferretti  [place]  REFERRING PROVIDER: [name]  REFERRING DIAG: M25.511  THERAPY DIAG:  Hemiplegia and hemiparesis following cerebral infarction affecting right dominant side ([place])  Acute pain of right shoulder  Pain in right wrist  Muscle weakness (generalized)  Rationale for Evaluation and Treatment Rehabilitation  ONSET DATE: 07/15/21  SUBJECTIVE:                                                                                                                                                                                      SUBJECTIVE STATEMENT: Patient is doing well but is having some R  arm pain.   PERTINENT HISTORY: Acute CVA 3/19  PAIN:  Are you having pain? Yes: NPRS scale: 6/10 Pain location: R shoulder Pain description: sharp Aggravating factors: movement, sleeping on R  Relieving factors: Tylenol and meloxicam   PRECAUTIONS: Fall  WEIGHT BEARING RESTRICTIONS No  FALLS:  Has patient fallen in last 6 months? Yes. Number of falls 4  LIVING ENVIRONMENT: Lives with: lives with their family Lives in: House/apartment Stairs: Yes: External: 2 steps; none Has following equipment at home: Single point cane, Hemi walker, Wheelchair (manual), and Grab bars  OCCUPATION: retired  PLOF: [place] try to get stronger, try to use my legs and feet   OBJECTIVE:   DIAGNOSTIC FINDINGS:  Right shoulder 3 views: Shoulder is well located.  Subacromial space well-maintained.  Type II downsloping acromion.  No acute fractures or acute findings Right wrist 3 views: No acute fractures.  No subluxation dislocation of  the carpal bones.  Wrist joints well-maintained.  No bony abnormalities.  PATIENT SURVEYS:  FOTO 4  COGNITION:  Overall cognitive status: Within functional limits for tasks assessed     SENSATION: WFL  POSTURE: Decreased R shoulder height, hemiparesis, forward head, flexed trunk, R lateral lean  UPPER EXTREMITY ROM:   Passive ROM  Right eval Left eval  Shoulder flexion 60d w/pain   Shoulder extension    Shoulder abduction 80d w/pain    Shoulder adduction    Shoulder internal rotation Full w/end range pain   Shoulder external rotation 40d w/pain   Elbow flexion full   Elbow extension full   Wrist flexion    Wrist extension    Wrist ulnar deviation    Wrist radial deviation    Wrist pronation    Wrist supination    (Blank rows = not tested)  UPPER EXTREMITY MMT: L WFL  MMT Right eval Left eval  Shoulder flexion 1   Shoulder extension    Shoulder abduction    Shoulder adduction 2-   Shoulder internal rotation 2-   Shoulder external rotation 2-   Middle trapezius    Lower trapezius    Elbow flexion 2+   Elbow extension 2-   Wrist flexion    Wrist extension    Wrist ulnar deviation    Wrist radial deviation    Wrist pronation    Wrist supination    Grip strength (lbs)    (Blank rows = not tested)  LE MMT 3/5 hip flexion, knee ext, knee flexion  Using hemiwalker, decrease R foot clearance, R foot ER, decrease hip flexion, decreased stance time on R LE, decreased step length, foot drop  SHOULDER SPECIAL TESTS:  Impingement tests: Neer impingement test: positive , Hawkins/Kennedy impingement test: positive , and Painful arc test: positive     Instability tests: Apprehension test: positive  and Sulcus sign: positive   JOINT MOBILITY TESTING:  Pain with PROM, muscle guarding, instability in R shoulder, scapular winging  PALPATION:  No TTP   FUNCTIONAL MEASURES Eval BERG 24/56 TUG 57.28s--slowed gait using hemiwalker, no foot clearance and decreased safety awareness esp with turns 5xSTS-- 25.72s, LUE push off, decreased control  w/descending   TODAY'S TREATMENT:  08/14/21 NuStep L3 x5 min LE only S2S 3x5 LUE assist elevated mat Alt box taps 6in then 8in w/ hemi walker x10 each  Steps 4 in 9 steps alt pattern one rails L  Attempted 6 inch steps RLE did give out on him when ascending  requiring Max assist to regain  balance. Hamstring curls 20lb 2x10, RLE 10lb x10 Leg Ext 10lb x10, 5lb x10  LAQ RLE 3lb 2x10    08/07/21 Amb with shopping cart 1 x 75'. Patient demonstrated step through gait, RLE with increased flexor moment at hip and knee in stance, but stable. Nu-Step 3 min at L4, then 3 x 30 sec at L7, legs only, focused push. Heel raises with BUE support on shopping cart. Step ups on 4" step, BUE support LLE step from floor to third step an dback, x 10 STM to ITB on R due to report of R knee pain. F/B stretch to ITB. L sidelie  with R hip abd, moving R hip forward and back while holding the abd. Supine U bridge on R.     BERG TUG  5xSTS  Nustep L2, x45mn  Hamstring curls redTB 2x10  LAQ 2x10 Heel taps 4" 2HHA support by stairs  PATIENT EDUCATION: Education details: POC Person educated: Patient and brother Education method: ECustomer service managerEducation comprehension: verbalized understanding   HOME EXERCISE PROGRAM: TBD for new referral  ASSESSMENT:  CLINICAL IMPRESSION:    Pt ~ 10 minutes late for todays session. He reports R shoulder pain as his main complaint. Session consisted of LE functional strength and balance. Some compensation present with sit to stands. Cue to maintain alternating pattern with 4 in stair negotiation. RLE did go out on hom whtn attempting 6 in stair height.    OBJECTIVE IMPAIRMENTS Abnormal gait, decreased mobility, difficulty walking, decreased ROM, decreased strength, decreased safety awareness, and pain.   ACTIVITY LIMITATIONS carrying, lifting, transfers, bathing, toileting, dressing, reach over head, and locomotion level  PARTICIPATION LIMITATIONS: cleaning, laundry, driving, community activity, and yard work  PERSONAL FACTORS Age, Time since onset of injury/illness/exacerbation, and 3+ comorbidities: chronic pain, acute CVA, stroke, OA  are also affecting patient's functional outcome.   REHAB POTENTIAL:  Fair due to time since stroke and increased deficits on R side  CLINICAL DECISION MAKING: Evolving/moderate complexity  EVALUATION COMPLEXITY: Moderate   GOALS: Goals reviewed with patient? No  SHORT TERM GOALS: Target date: 09/08/21 Patient will be independent with initial HEP.  Goal status: ongoing  2.  Patient will be educated on strategies to decrease risk of falls.  Goal status: ongoing   LONG TERM GOALS: Target date: 10/06/21  1.  Patient will report 75% improvement in R shoulder pain to improve QOL.  Goal status: INITIAL  2.  Patient to demonstrate improved upright posture with posterior shoulder girdle engaged to promote improved glenohumeral joint mobility. Baseline: decreased shoulder height on R side, decreased shoulder stability, scapular winging  Goal status: INITIAL  3.  Patient to improve R shoulder AROM to  within patient's functional norm  without pain provocation to allow for increased ease of ADLs.  Baseline: measured w/PROM Goal status: INITIAL  4.  Patient will demonstrate improved functional UE and LE strength as demonstrated by >= 4/5 in all muscle groups . Goal status: INITIAL  5.  Patient will report 425on FOTO(patient outcome measure)  to demonstrate improved functional ability.  Baseline: 4 Goal status: INITIAL  6. Patient will score 34 or greater on Berg Balance test to demonstrate lower risk of falls. (MCID= 8 points) .  Baseline: 24 Goal status: INITIAL  7. Patient will demonstrate decreased fall risk by scoring < 25 sec on TUG and <20 on 5xSTS. Baseline: 57.28s, 25.72s Goal status: INITIAL     PLAN: PT FREQUENCY: 3x/week  PT DURATION: 8 weeks  PLANNED INTERVENTIONS: Therapeutic exercises, Therapeutic activity, Neuromuscular re-education, Balance training, Gait training, Patient/Family education, Joint mobilization, Stair training, Orthotic/Fit training, and Manual therapy  PLAN FOR NEXT SESSION: if referral doesn't change work on  PROM for R shoulder and low level strengthening.    Marcelina Morel, DPT 08/14/2021, 8:59 AM

## 2021-08-14 NOTE — Therapy (Unsigned)
OUTPATIENT OCCUPATIONAL THERAPY TREATMENT NOTE  Patient Name: Aaron Dennis MRN: [id number] DOB:11-21-1955, 66 y.o., male Today's Date: 08/14/2021  PCP: Charlott Rakes, MD REFERRING PROVIDER: Pete Pelt, PA-C   OT End of Session - 08/14/21 [phone]     Visit Number 4    Number of Visits 25    Date for OT Re-Evaluation 10/03/21    Authorization Type UHC Medicare; Medicaid [place] (secondary)    Authorization Time Period VL: [place]    Progress Note Due on Visit 10    OT Start Time 0930    OT Stop Time 1015    OT Time Calculation (min) 45 min    Activity Tolerance Patient tolerated treatment well    Behavior During Therapy WFL for tasks assessed/performed            Past Medical History:  Diagnosis Date   GERD (gastroesophageal reflux disease)    Hypertension    Lung nodule    7 mm LUL nodule   Past Surgical History:  Procedure Laterality Date   IR RADIOLOGIST EVAL & MGMT  07/02/2021   IR RADIOLOGIST EVAL & MGMT  07/22/2021   Patient Active Problem List   Diagnosis Date Noted   Aneurysm, cerebral, nonruptured 08/13/2021   Status post stroke due to cerebrovascular disease    Cerebral edema ([place]) 04/29/2021   Stroke (cerebrum) ([place]) 04/29/2021   Right sided weakness    Acute CVA (cerebrovascular accident) ([place]) 04/27/2021   Primary osteoarthritis of left knee 01/13/2019   Primary osteoarthritis of right knee 01/13/2019   Flat foot 11/28/2017   Tendonitis of foot 11/28/2017   Polycythemia 05/12/2017   Vitamin D deficiency 04/21/2017   Bony sclerosis 08/05/2016   Lung nodule    Chronic pain of both knees 12/05/2015   Gastroesophageal reflux disease without esophagitis 05/17/2014   Essential hypertension 05/17/2014   Coronary artery calcification 08/17/2012   Abnormal LFTs (liver function tests) 07/11/2012   Chronic abdominal pain 06/24/2012   Right-sided chest wall pain 06/24/2012   Constipation, chronic 06/24/2012   Hyperbilirubinemia 06/24/2012   Chronic back pain  06/24/2012   DDD (degenerative disc disease), thoracolumbar 06/24/2012   History of nephrolithiasis 06/24/2012   Side pain 06/24/2012   Low back pain at multiple sites 06/24/2012    ONSET DATE: 07/17/21 (date of OT order)  REFERRING DIAG: M25.531 (ICD-10-CM) - Pain in right wrist M25.511 (ICD-10-CM) - Acute pain of right shoulder   THERAPY DIAG:  Hemiplegia and hemiparesis following cerebral infarction affecting right dominant side ([facility])  Acute pain of right shoulder  Pain in right wrist  Muscle weakness (generalized)  Other symptoms and signs involving the nervous system  Rationale for Evaluation and Treatment Rehabilitation  SUBJECTIVE:   SUBJECTIVE STATEMENT: Pt reports he remembered to bring his GivMohr sling today Pt accompanied by: self and family member (brother, Selinda Michaels)  PAIN: Are you having pain? Yes: NPRS scale: 6/10 Pain location: RUE - upper arm and shoulder Pain description: "sharp" Aggravating factors: movement Relieving factors: OTC medication/cream  PERTINENT HISTORY: L frontoparietal CVA w/ residual R-sided weakness, presented to ED 04/27/21 after recent prolonged travel; PMH includes h/o polycythemia, HTN  PRECAUTIONS: Fall  FALLS: Has patient fallen in last 6 months? Yes. Number of falls 3  PLOF: Independent and Vocation/Vocational requirements: retired Architect  PATIENT GOALS "Work on my shoulder"   OBJECTIVE:   UPPER EXTREMITY ROM: LUE WFL; RUE measurements below  Active ROM Right Eval - 6/26  Shoulder flexion Unable  Shoulder abduction  Unable  Shoulder adduction WFL  Shoulder extension 24  Shoulder internal rotation [facility]  Shoulder external rotation Unable  Elbow flexion WFL  Elbow extension 33  Wrist flexion 27  Wrist extension 28  Wrist pronation To be assessed  Wrist supination To be assessed  (Blank rows = not tested)  UPPER EXTREMITY MMT:     MMT Right Eval - 6/26  Shoulder flexion 1/5  Shoulder abduction 1/5   Shoulder extension 3-/5  Shoulder internal rotation 2/5  Shoulder external rotation 2-/5  Elbow flexion 3/5  Elbow extension 2-/5  Wrist flexion 2-/5  Wrist extension 2-/5  Wrist pronation 2+/5  Wrist supination 2+/5  (Blank rows = not tested)   TODAY'S TREATMENT - 08/14/21: GivMohr sling - OT donned GivMohr and completed adjustments to fit patient and then had pt return demonstration of donning/doffing w/ Min A; OT provided corresponding education regarding wear and care of orthosis as well as purpose and benefit for managing L shoulder subluxation BUE scapular retraction while seated upright w/ elbows flexed; 1st set of 10 reps completed against light resistance w/ yellow theraband and 2nd set focusing on AROM w/ no significant difference noted in activation. OT provided tactile and verbal cues for full arc of motion during exercise and to decrease compensatory movement Weight shifting while sitting EOM to weight bear through RUE w/ disengaging of contralateral LUE for cross-body reaching to retrieve therapy cones positioned at low and high levels; completed 2x5 at each height. Pt required significant support of L shoulder and to maintain hand position on therapy mat R hand gross grasp and release to retrieve cones and place into stack in L hand across midline while sitting EOM; pt demonstrated improved GMC during task compared to prior sessions and required verbal cues/modeling to incorporate supination to stack cones at an angle w/ good carryover within exercise Closed-chain modified forward chest press while holding unweighted dowel and seated EOM w/ OT facilitating scapular retraction/depression each rep; completed 2x10 Closed-chain elbow flexion holding unweighted dowel completed 2x10 while sitting EOM; OT provided significant cues at R shoulder to decrease compensatory movements and modeling w/ verbal/tactile cues to facilitate initial understanding   PATIENT EDUCATION: Ongoing  condition-specific education; emphasizing importance of using RUE as much as possible and avoiding compensation w/ LUE Person educated: Patient Education method: Explanation Education comprehension: verbalized understanding   HOME EXERCISE PROGRAM: Access Code: [email or web address] - Supine Shoulder Flexion AAROM with Hands Clasped  - 3 x daily - 7 x weekly - 1 sets - 10 reps - Seated Elbow Flexion and Extension AROM  - 3 x daily - 7 x weekly - 2 sets - 10 reps - Seated Forearm Pronation and Supination AROM  - 3 x daily - 7 x weekly - 2 sets - 10 reps - Wrist AROM Flexion Extension  - 3 x daily - 7 x weekly - 2 sets - 10 reps   GOALS: Goals reviewed with patient? Yes  SHORT TERM GOALS: Target date: 09/01/21  STG  Status:  1 Pt will demonstrate understanding of initial HEP designed for RUE ROM Baseline: No HEP at this time Progressing  2 Pt to improve R shoulder flexion strength to at least 2-/5 (partial ROM, gravity min) Baseline: shoulder flex MMT: 1/5 Progressing  3 Pt will be able to grasp and lift lightweight object to mouth w/ R, dominant UE in at least 2 trials Baseline: Significantly limited gross grasp Progressing  4 Pt will achieve at least 50% of R wrist extension  AROM against gravity Baseline: partial ROM in horizontal plane Progressing     LONG TERM GOALS: Target date: 10/03/21  LTG  Status:  1 Pt will improve FOTO score to at least 45 to indicate improved participation in functional activities Baseline: Intake - 4 Progressing  2 Pt will be able to achieve at least partial AROM of R shoulder flexion against gravity by discharge for improved functional use of RUE Baseline: shoulder flex MMT: 1/5 Progressing  3 Pt will be able to complete at least 10 blocks during Box and Blocks Test to indicated improved unilateral GMC of R, dominant UE Baseline: Unable at time of evaluation Progressing  4 Pt will be able to demonstrate incorporation of RUE as gross assist in at least 1 functional  task (pulling up pants, stabilizing plate, etc.) Baseline: Not incorporating LUE at gross level at time of eval Progressing     ASSESSMENT:  CLINICAL IMPRESSION: Pt continues to progress well toward goals, demonstrating improved GMC and grasp and release of L hand today. Session focused on continued NMR of RUE w/ emphasis on shoulder alignment and scapular stability. Also attempted weight bearing this session for inhibition of tone, facilitation of muscle/equal activation, and increased proprioceptive input over affected UE. Pt continues to benefit from cues to incorporate RUE into activities and exercises w/out assist from LUE.  PERFORMANCE DEFICITS in functional skills including ADLs, IADLs, coordination, dexterity, proprioception, sensation, tone, ROM, strength, pain, FMC, GMC, mobility, balance, body mechanics, decreased knowledge of use of DME, and UE functional use, cognitive skills including safety awareness, and psychosocial skills including environmental adaptation.   IMPAIRMENTS are limiting patient from ADLs, IADLs, rest and sleep, and work.   COMORBIDITIES may have co-morbidities  that affects occupational performance. Patient will benefit from skilled OT to address above impairments and improve overall function.   PLAN: OT FREQUENCY: 3x/week  OT DURATION: 8 weeks  PLANNED INTERVENTIONS: self care/ADL training, therapeutic exercise, therapeutic activity, neuromuscular re-education, manual therapy, passive range of motion, balance training, functional mobility training, aquatic therapy, splinting, electrical stimulation, ultrasound, iontophoresis, moist heat, cryotherapy, patient/family education, and DME and/or AE instructions  RECOMMENDED OTHER SERVICES: Currently receiving PT services at this location  CONSULTED AND AGREED WITH PLAN OF CARE: Patient and family member/caregiver  PLAN FOR NEXT SESSION: Scapular stability (e.g., supine, retraction/depression, rows); continue to  address NMR of RUE [place] and functional grasp and release   Kathrine Cords, MSOT, OTR/L 08/14/2021, 10:38 AM

## 2021-08-15 DIAGNOSIS — M21371 Foot drop, right foot: Secondary | ICD-10-CM | POA: Diagnosis not present

## 2021-08-18 ENCOUNTER — Ambulatory Visit: Payer: Medicare Other | Admitting: Physical Therapy

## 2021-08-18 ENCOUNTER — Ambulatory Visit: Payer: Medicare Other | Admitting: Occupational Therapy

## 2021-08-18 ENCOUNTER — Encounter: Payer: Self-pay | Admitting: Occupational Therapy

## 2021-08-18 ENCOUNTER — Encounter: Payer: Self-pay | Admitting: Physical Therapy

## 2021-08-18 DIAGNOSIS — I69351 Hemiplegia and hemiparesis following cerebral infarction affecting right dominant side: Secondary | ICD-10-CM | POA: Diagnosis not present

## 2021-08-18 DIAGNOSIS — M25511 Pain in right shoulder: Secondary | ICD-10-CM | POA: Diagnosis not present

## 2021-08-18 DIAGNOSIS — R531 Weakness: Secondary | ICD-10-CM | POA: Diagnosis not present

## 2021-08-18 DIAGNOSIS — M6281 Muscle weakness (generalized): Secondary | ICD-10-CM

## 2021-08-18 DIAGNOSIS — R29818 Other symptoms and signs involving the nervous system: Secondary | ICD-10-CM | POA: Diagnosis not present

## 2021-08-18 DIAGNOSIS — M25561 Pain in right knee: Secondary | ICD-10-CM | POA: Diagnosis not present

## 2021-08-18 DIAGNOSIS — M25531 Pain in right wrist: Secondary | ICD-10-CM

## 2021-08-18 DIAGNOSIS — G8929 Other chronic pain: Secondary | ICD-10-CM | POA: Diagnosis not present

## 2021-08-18 DIAGNOSIS — Q782 Osteopetrosis: Secondary | ICD-10-CM | POA: Diagnosis not present

## 2021-08-18 DIAGNOSIS — M545 Low back pain, unspecified: Secondary | ICD-10-CM | POA: Diagnosis not present

## 2021-08-18 DIAGNOSIS — I639 Cerebral infarction, unspecified: Secondary | ICD-10-CM | POA: Diagnosis not present

## 2021-08-18 DIAGNOSIS — I63412 Cerebral infarction due to embolism of left middle cerebral artery: Secondary | ICD-10-CM | POA: Diagnosis not present

## 2021-08-18 DIAGNOSIS — Z8673 Personal history of transient ischemic attack (TIA), and cerebral infarction without residual deficits: Secondary | ICD-10-CM | POA: Diagnosis not present

## 2021-08-18 DIAGNOSIS — R2689 Other abnormalities of gait and mobility: Secondary | ICD-10-CM | POA: Diagnosis not present

## 2021-08-18 DIAGNOSIS — R293 Abnormal posture: Secondary | ICD-10-CM | POA: Diagnosis not present

## 2021-08-18 DIAGNOSIS — M549 Dorsalgia, unspecified: Secondary | ICD-10-CM | POA: Diagnosis not present

## 2021-08-18 NOTE — Therapy (Unsigned)
OUTPATIENT OCCUPATIONAL THERAPY TREATMENT NOTE  Patient Name: Aaron Dennis MRN: [id number] DOB:[birth year], 66 y.o., male Today's Date: 08/18/2021  PCP: Charlott Rakes, MD REFERRING PROVIDER: Pete Pelt, PA-C   OT End of Session - 08/18/21 1030     Visit Number 5    Number of Visits 25    Date for OT Re-Evaluation 10/03/21    Authorization Type UHC Medicare; Medicaid [place] (secondary)    Authorization Time Period VL: [place]    Progress Note Due on Visit 10    OT Start Time 1022    OT Stop Time 1100    OT Time Calculation (min) 38 min    Activity Tolerance Patient tolerated treatment well    Behavior During Therapy WFL for tasks assessed/performed             Past Medical History:  Diagnosis Date   GERD (gastroesophageal reflux disease)    Hypertension    Lung nodule    7 mm LUL nodule   Past Surgical History:  Procedure Laterality Date   IR RADIOLOGIST EVAL & MGMT  07/02/2021   IR RADIOLOGIST EVAL & MGMT  07/22/2021   Patient Active Problem List   Diagnosis Date Noted   Aneurysm, cerebral, nonruptured 08/13/2021   Status post stroke due to cerebrovascular disease    Cerebral edema ([place]) 04/29/2021   Stroke (cerebrum) ([place]) 04/29/2021   Right sided weakness    Acute CVA (cerebrovascular accident) ([place]) 04/27/2021   Primary osteoarthritis of left knee 01/13/2019   Primary osteoarthritis of right knee 01/13/2019   Flat foot 11/28/2017   Tendonitis of foot 11/28/2017   Polycythemia 05/12/2017   Vitamin D deficiency 04/21/2017   Bony sclerosis 08/05/2016   Lung nodule    Chronic pain of both knees 12/05/2015   Gastroesophageal reflux disease without esophagitis 05/17/2014   Essential hypertension 05/17/2014   Coronary artery calcification 08/17/2012   Abnormal LFTs (liver function tests) 07/11/2012   Chronic abdominal pain 06/24/2012   Right-sided chest wall pain 06/24/2012   Constipation, chronic 06/24/2012   Hyperbilirubinemia 06/24/2012   Chronic back pain  06/24/2012   DDD (degenerative disc disease), thoracolumbar 06/24/2012   History of nephrolithiasis 06/24/2012   Side pain 06/24/2012   Low back pain at multiple sites 06/24/2012    ONSET DATE: 07/17/21 (date of OT order)  REFERRING DIAG: M25.531 (ICD-10-CM) - Pain in right wrist M25.511 (ICD-10-CM) - Acute pain of right shoulder   THERAPY DIAG:  No diagnosis found.  Rationale for Evaluation and Treatment Rehabilitation  SUBJECTIVE:   SUBJECTIVE STATEMENT: Pt reports he fell on Friday just walking through the house Pt accompanied by: self and family member (brother, Selinda Michaels)  PAIN: Are you having pain? Yes: NPRS scale: 7-8/10 Pain location: scapula (anteriomedial border), anterior forearm, and a little in the wrist Pain description: "sharp" Aggravating factors: movement Relieving factors: OTC medication/cream  PERTINENT HISTORY: L frontoparietal CVA w/ residual R-sided weakness, presented to ED 04/27/21 after recent prolonged travel; PMH includes h/o polycythemia, HTN  PRECAUTIONS: Fall  FALLS: Has patient fallen in last 6 months? Yes. Number of falls 3  PLOF: Independent and Vocation/Vocational requirements: retired Architect  PATIENT GOALS "Work on my shoulder"   OBJECTIVE:   UPPER EXTREMITY ROM: LUE WFL; RUE measurements below  Active ROM Right Eval - 6/26  Shoulder flexion Unable  Shoulder abduction Unable  Shoulder adduction WFL  Shoulder extension 24  Shoulder internal rotation [facility]  Shoulder external rotation Unable  Elbow flexion WFL  Elbow  extension 33  Wrist flexion 27  Wrist extension 28  Wrist pronation To be assessed  Wrist supination To be assessed  (Blank rows = not tested)  UPPER EXTREMITY MMT:     MMT Right Eval - 6/26  Shoulder flexion 1/5  Shoulder abduction 1/5  Shoulder extension 3-/5  Shoulder internal rotation 2/5  Shoulder external rotation 2-/5  Elbow flexion 3/5  Elbow extension 2-/5  Wrist flexion 2-/5  Wrist extension  2-/5  Wrist pronation 2+/5  Wrist supination 2+/5  (Blank rows = not tested)   TODAY'S TREATMENT - 08/18/21: GivMohr sling - OT donned GivMohr and completed adjustments to fit patient and then had pt return demonstration of donning/doffing w/ Min A; OT provided corresponding education regarding wear and care of orthosis as well as purpose and benefit for managing L shoulder subluxation BUE scapular retraction while seated upright w/ elbows flexed; 1st set of 10 reps completed against light resistance w/ yellow theraband and 2nd set focusing on AROM w/ no significant difference noted in activation. OT provided tactile and verbal cues for full arc of motion during exercise and to decrease compensatory movement Weight shifting while sitting EOM to weight bear through RUE w/ disengaging of contralateral LUE for cross-body reaching to retrieve therapy cones positioned at low and high levels; completed 2x5 at each height. Pt required significant support of L shoulder and to maintain hand position on therapy mat R hand gross grasp and release to retrieve cones and place into stack in L hand across midline while sitting EOM; pt demonstrated improved GMC during task compared to prior sessions and required verbal cues/modeling to incorporate supination to stack cones at an angle w/ good carryover within exercise Closed-chain modified forward chest press while holding unweighted dowel and seated EOM w/ OT facilitating scapular retraction/depression each rep; completed 2x10 Closed-chain elbow flexion holding unweighted dowel completed 2x10 while sitting EOM; OT provided significant cues at R shoulder to decrease compensatory movements and modeling w/ verbal/tactile cues to facilitate initial understanding   PATIENT EDUCATION: Ongoing condition-specific education; emphasizing importance of using RUE as much as possible and avoiding compensation w/ LUE Person educated: Patient Education method:  Explanation Education comprehension: verbalized understanding   HOME EXERCISE PROGRAM: Access Code: [email or web address] - Supine Shoulder Flexion AAROM with Hands Clasped  - 3 x daily - 7 x weekly - 1 sets - 10 reps - Seated Elbow Flexion and Extension AROM  - 3 x daily - 7 x weekly - 2 sets - 10 reps - Seated Forearm Pronation and Supination AROM  - 3 x daily - 7 x weekly - 2 sets - 10 reps - Wrist AROM Flexion Extension  - 3 x daily - 7 x weekly - 2 sets - 10 reps   GOALS: Goals reviewed with patient? Yes  SHORT TERM GOALS: Target date: 09/01/21  STG  Status:  1 Pt will demonstrate understanding of initial HEP designed for RUE ROM Baseline: No HEP at this time Progressing  2 Pt to improve R shoulder flexion strength to at least 2-/5 (partial ROM, gravity min) Baseline: shoulder flex MMT: 1/5 Progressing  3 Pt will be able to grasp and lift lightweight object to mouth w/ R, dominant UE in at least 2 trials Baseline: Significantly limited gross grasp Progressing  4 Pt will achieve at least 50% of R wrist extension AROM against gravity Baseline: partial ROM in horizontal plane Progressing     LONG TERM GOALS: Target date: 10/03/21  LTG  Status:  1 Pt will improve FOTO score to at least 45 to indicate improved participation in functional activities Baseline: Intake - 4 Progressing  2 Pt will be able to achieve at least partial AROM of R shoulder flexion against gravity by discharge for improved functional use of RUE Baseline: shoulder flex MMT: 1/5 Progressing  3 Pt will be able to complete at least 10 blocks during Box and Blocks Test to indicated improved unilateral GMC of R, dominant UE Baseline: Unable at time of evaluation Progressing  4 Pt will be able to demonstrate incorporation of RUE as gross assist in at least 1 functional task (pulling up pants, stabilizing plate, etc.) Baseline: Not incorporating LUE at gross level at time of eval Progressing     ASSESSMENT:  CLINICAL  IMPRESSION: Pt continues to progress well toward goals, demonstrating improved GMC and grasp and release of L hand today. Session focused on continued NMR of RUE w/ emphasis on shoulder alignment and scapular stability. Also attempted weight bearing this session for inhibition of tone, facilitation of muscle/equal activation, and increased proprioceptive input over affected UE. Pt continues to benefit from cues to incorporate RUE into activities and exercises w/out assist from LUE.  PERFORMANCE DEFICITS in functional skills including ADLs, IADLs, coordination, dexterity, proprioception, sensation, tone, ROM, strength, pain, FMC, GMC, mobility, balance, body mechanics, decreased knowledge of use of DME, and UE functional use, cognitive skills including safety awareness, and psychosocial skills including environmental adaptation.   IMPAIRMENTS are limiting patient from ADLs, IADLs, rest and sleep, and work.   COMORBIDITIES may have co-morbidities  that affects occupational performance. Patient will benefit from skilled OT to address above impairments and improve overall function.   PLAN: OT FREQUENCY: 3x/week  OT DURATION: 8 weeks  PLANNED INTERVENTIONS: self care/ADL training, therapeutic exercise, therapeutic activity, neuromuscular re-education, manual therapy, passive range of motion, balance training, functional mobility training, aquatic therapy, splinting, electrical stimulation, ultrasound, iontophoresis, moist heat, cryotherapy, patient/family education, and DME and/or AE instructions  RECOMMENDED OTHER SERVICES: Currently receiving PT services at this location  CONSULTED AND AGREED WITH PLAN OF CARE: Patient and family member/caregiver  PLAN FOR NEXT SESSION: Scapular stability (e.g., supine, retraction/depression, rows); continue to address NMR of RUE [place] and functional grasp and release   Kathrine Cords, MSOT, OTR/L 08/18/2021, 10:30 AM

## 2021-08-18 NOTE — Therapy (Signed)
OUTPATIENT PHYSICAL THERAPY TREATMENT   Patient Name: Aaron Dennis MRN: [id number] DOB:1955-03-27, 66 y.o., male Today's Date: 08/18/2021   PT End of Session - 08/18/21 1100     Visit Number 5    Date for PT Re-Evaluation 10/06/21    PT Start Time 43    PT Stop Time 1145    PT Time Calculation (min) 45 min    Activity Tolerance Patient tolerated treatment well;Patient limited by pain    Behavior During Therapy [facility] for tasks assessed/performed               Past Medical History:  Diagnosis Date   GERD (gastroesophageal reflux disease)    Hypertension    Lung nodule    7 mm LUL nodule   Past Surgical History:  Procedure Laterality Date   IR RADIOLOGIST EVAL & MGMT  07/02/2021   IR RADIOLOGIST EVAL & MGMT  07/22/2021   Patient Active Problem List   Diagnosis Date Noted   Aneurysm, cerebral, nonruptured 08/13/2021   Status post stroke due to cerebrovascular disease    Cerebral edema ([place]) 04/29/2021   Stroke (cerebrum) ([place]) 04/29/2021   Right sided weakness    Acute CVA (cerebrovascular accident) ([place]) 04/27/2021   Primary osteoarthritis of left knee 01/13/2019   Primary osteoarthritis of right knee 01/13/2019   Flat foot 11/28/2017   Tendonitis of foot 11/28/2017   Polycythemia 05/12/2017   Vitamin D deficiency 04/21/2017   Bony sclerosis 08/05/2016   Lung nodule    Chronic pain of both knees 12/05/2015   Gastroesophageal reflux disease without esophagitis 05/17/2014   Essential hypertension 05/17/2014   Coronary artery calcification 08/17/2012   Abnormal LFTs (liver function tests) 07/11/2012   Chronic abdominal pain 06/24/2012   Right-sided chest wall pain 06/24/2012   Constipation, chronic 06/24/2012   Hyperbilirubinemia 06/24/2012   Chronic back pain 06/24/2012   DDD (degenerative disc disease), thoracolumbar 06/24/2012   History of nephrolithiasis 06/24/2012   Side pain 06/24/2012   Low back pain at multiple sites 06/24/2012    PCP: Charlane Ferretti  [place]  REFERRING PROVIDER: [name]  REFERRING DIAG: M25.511  THERAPY DIAG:  Hemiplegia and hemiparesis following cerebral infarction affecting right dominant side ([place])  Acute pain of right shoulder  Muscle weakness (generalized)  Pain in right wrist  Rationale for Evaluation and Treatment Rehabilitation  ONSET DATE: 07/15/21  SUBJECTIVE:                                                                                                                                                                                      SUBJECTIVE STATEMENT: Doing well, no pain today.   PERTINENT HISTORY:  Acute CVA 3/19  PAIN:  Are you having pain? No  PRECAUTIONS: Fall  WEIGHT BEARING RESTRICTIONS No  FALLS:  Has patient fallen in last 6 months? Yes. Number of falls 4  LIVING ENVIRONMENT: Lives with: lives with their family Lives in: House/apartment Stairs: Yes: External: 2 steps; none Has following equipment at home: Single point cane, Hemi walker, Wheelchair (manual), and Grab bars  OCCUPATION: retired  PLOF: [place] try to get stronger, try to use my legs and feet   OBJECTIVE:   DIAGNOSTIC FINDINGS:  Right shoulder 3 views: Shoulder is well located.  Subacromial space well-maintained.  Type II downsloping acromion.  No acute fractures or acute findings Right wrist 3 views: No acute fractures.  No subluxation dislocation of  the carpal bones.  Wrist joints well-maintained.  No bony abnormalities.  PATIENT SURVEYS:  FOTO 4  COGNITION:  Overall cognitive status: Within functional limits for tasks assessed     SENSATION: WFL  POSTURE: Decreased R shoulder height, hemiparesis, forward head, flexed trunk, R lateral lean  UPPER EXTREMITY ROM:   Passive ROM Right eval Left eval  Shoulder flexion 60d w/pain   Shoulder extension    Shoulder abduction 80d w/pain    Shoulder adduction    Shoulder internal rotation Full w/end range pain    Shoulder external rotation 40d w/pain   Elbow flexion full   Elbow extension full   Wrist flexion    Wrist extension    Wrist ulnar deviation    Wrist radial deviation    Wrist pronation    Wrist supination    (Blank rows = not tested)  UPPER EXTREMITY MMT: L WFL  MMT Right eval Left eval  Shoulder flexion 1   Shoulder extension    Shoulder abduction    Shoulder adduction 2-   Shoulder internal rotation 2-   Shoulder external rotation 2-   Middle trapezius    Lower trapezius    Elbow flexion 2+   Elbow extension 2-   Wrist flexion    Wrist extension    Wrist ulnar deviation    Wrist radial deviation    Wrist pronation    Wrist supination    Grip strength (lbs)    (Blank rows = not tested)  LE MMT 3/5 hip flexion, knee ext, knee flexion  Using hemiwalker, decrease R foot clearance, R foot ER, decrease hip flexion, decreased stance time on R LE, decreased step length, foot drop  SHOULDER SPECIAL TESTS:  Impingement tests: Neer impingement test: positive , Hawkins/Kennedy impingement test: positive , and Painful arc test: positive     Instability tests: Apprehension test: positive  and Sulcus sign: positive   JOINT MOBILITY TESTING:  Pain with PROM, muscle guarding, instability in R shoulder, scapular winging  PALPATION:  No TTP   FUNCTIONAL MEASURES Eval BERG 24/56 TUG 57.28s--slowed gait using hemiwalker, no foot clearance and decreased safety awareness esp with turns 5xSTS-- 25.72s, LUE push off, decreased control  w/descending   TODAY'S TREATMENT:  08/18/21 NuStep L3 x 6 min S2S x10, initially using LUE then progressing to none then 2x5 with lower elevation LAQ RLE 3# 2x10 Hamstring curls 20# 2x10, RLE 10# 2x10 Ambulate one lap with hemiwalker Toe taps on 6" box 1x10 using hemiwalker  08/14/21 NuStep L3 x5 min LE only S2S 3x5 LUE assist elevated mat Alt box taps 6in then 8in w/ hemi walker x10 each  Steps 4 in 9 steps alt pattern one rails  L  Attempted  6 inch steps RLE did give out on him when ascending requiring Max assist to regain balance. Hamstring curls 20lb 2x10, RLE 10lb x10 Leg Ext 10lb x10, 5lb x10  LAQ RLE 3lb 2x10    08/07/21 Amb with shopping cart 1 x 75'. Patient demonstrated step through gait, RLE with increased flexor moment at hip and knee in stance, but stable. Nu-Step 3 min at L4, then 3 x 30 sec at L7, legs only, focused push. Heel raises with BUE support on shopping cart. Step ups on 4" step, BUE support LLE step from floor to third step an dback, x 10 STM to ITB on R due to report of R knee pain. F/B stretch to ITB. L sidelie  with R hip abd, moving R hip forward and back while holding the abd. Supine U bridge on R.     BERG TUG  5xSTS  Nustep L2, x20mn  Hamstring curls redTB 2x10  LAQ 2x10 Heel taps 4" 2HHA support by stairs  PATIENT EDUCATION: Education details: POC Person educated: Patient and brother Education method: ECustomer service managerEducation comprehension: verbalized understanding   HOME EXERCISE PROGRAM: TBD for new referral  ASSESSMENT:  CLINICAL IMPRESSION:     Pt enters today doing well and reporting no pain. States he had a fall on Friday and hurt his left wrist and had a laceration to his forehead. He thinks he just lost his balance when he fell. Today we worked on LE strengthening and functional exercises. Pt still exhibits weakness in RLE but participates well throughout session. Verbal cues for complete knee flexion and extension during exercises.   OBJECTIVE IMPAIRMENTS Abnormal gait, decreased mobility, difficulty walking, decreased ROM, decreased strength, decreased safety awareness, and pain.   ACTIVITY LIMITATIONS carrying, lifting, transfers, bathing, toileting, dressing, reach over head, and locomotion level  PARTICIPATION LIMITATIONS: cleaning, laundry, driving, community activity, and yard work  PERSONAL FACTORS Age, Time since onset of  injury/illness/exacerbation, and 3+ comorbidities: chronic pain, acute CVA, stroke, OA  are also affecting patient's functional outcome.   REHAB POTENTIAL: Fair due to time since stroke and increased deficits on R side  CLINICAL DECISION MAKING: Evolving/moderate complexity  EVALUATION COMPLEXITY: Moderate   GOALS: Goals reviewed with patient? No  SHORT TERM GOALS: Target date: 09/08/21 Patient will be independent with initial HEP.  Goal status: ongoing  2.  Patient will be educated on strategies to decrease risk of falls.  Goal status: ongoing   LONG TERM GOALS: Target date: 10/06/21  1.  Patient will report 75% improvement in R shoulder pain to improve QOL.  Goal status: INITIAL  2.  Patient to demonstrate improved upright posture with posterior shoulder girdle engaged to promote improved glenohumeral joint mobility. Baseline: decreased shoulder height on R side, decreased shoulder stability, scapular winging  Goal status: INITIAL  3.  Patient to improve R shoulder AROM to  within patient's functional norm  without pain provocation to allow for increased ease of ADLs.  Baseline: measured w/PROM Goal status: INITIAL  4.  Patient will demonstrate improved functional UE and LE strength as demonstrated by >= 4/5 in all muscle groups . Goal status: INITIAL  5.  Patient will report 481on FOTO(patient outcome measure)  to demonstrate improved functional ability.  Baseline: 4 Goal status: INITIAL  6. Patient will score 34 or greater on Berg Balance test to demonstrate lower risk of falls. (MCID= 8 points) .  Baseline: 24 Goal status: INITIAL  7. Patient will demonstrate decreased fall risk by  scoring < 25 sec on TUG and <20 on 5xSTS. Baseline: 57.28s, 25.72s Goal status: INITIAL     PLAN: PT FREQUENCY: 3x/week  PT DURATION: 8 weeks  PLANNED INTERVENTIONS: Therapeutic exercises, Therapeutic activity, Neuromuscular re-education, Balance training, Gait training,  Patient/Family education, Joint mobilization, Stair training, Orthotic/Fit training, and Manual therapy  PLAN FOR NEXT SESSION: if referral doesn't change work on PROM for R shoulder and low level strengthening.    Jennae Hakeem, SPTA 08/18/2021, 11:06 AM

## 2021-08-19 ENCOUNTER — Ambulatory Visit: Payer: Medicare Other | Admitting: Occupational Therapy

## 2021-08-19 ENCOUNTER — Ambulatory Visit: Payer: Medicare Other | Admitting: Physical Therapy

## 2021-08-19 ENCOUNTER — Encounter: Payer: Self-pay | Admitting: Physical Therapy

## 2021-08-19 ENCOUNTER — Encounter: Payer: Self-pay | Admitting: Occupational Therapy

## 2021-08-19 DIAGNOSIS — R2689 Other abnormalities of gait and mobility: Secondary | ICD-10-CM

## 2021-08-19 DIAGNOSIS — I69351 Hemiplegia and hemiparesis following cerebral infarction affecting right dominant side: Secondary | ICD-10-CM

## 2021-08-19 DIAGNOSIS — Z8673 Personal history of transient ischemic attack (TIA), and cerebral infarction without residual deficits: Secondary | ICD-10-CM | POA: Diagnosis not present

## 2021-08-19 DIAGNOSIS — M25511 Pain in right shoulder: Secondary | ICD-10-CM | POA: Diagnosis not present

## 2021-08-19 DIAGNOSIS — M6281 Muscle weakness (generalized): Secondary | ICD-10-CM

## 2021-08-19 DIAGNOSIS — M545 Low back pain, unspecified: Secondary | ICD-10-CM | POA: Diagnosis not present

## 2021-08-19 DIAGNOSIS — M25531 Pain in right wrist: Secondary | ICD-10-CM | POA: Diagnosis not present

## 2021-08-19 DIAGNOSIS — I63412 Cerebral infarction due to embolism of left middle cerebral artery: Secondary | ICD-10-CM

## 2021-08-19 DIAGNOSIS — G8929 Other chronic pain: Secondary | ICD-10-CM | POA: Diagnosis not present

## 2021-08-19 DIAGNOSIS — R29818 Other symptoms and signs involving the nervous system: Secondary | ICD-10-CM | POA: Diagnosis not present

## 2021-08-19 DIAGNOSIS — R293 Abnormal posture: Secondary | ICD-10-CM | POA: Diagnosis not present

## 2021-08-19 NOTE — Therapy (Signed)
OUTPATIENT PHYSICAL THERAPY TREATMENT   Patient Name: Aaron Dennis MRN: [id number] DOB:[birth year], 66 y.o., male Today's Date: 08/19/2021   PT End of Session - 08/19/21 1059     Visit Number 6    Date for PT Re-Evaluation 10/06/21    PT Start Time 1017    PT Stop Time 1056    PT Time Calculation (min) 39 min    Activity Tolerance Patient tolerated treatment well;Patient limited by pain    Behavior During Therapy [facility] for tasks assessed/performed                Past Medical History:  Diagnosis Date   GERD (gastroesophageal reflux disease)    Hypertension    Lung nodule    7 mm LUL nodule   Past Surgical History:  Procedure Laterality Date   IR RADIOLOGIST EVAL & MGMT  07/02/2021   IR RADIOLOGIST EVAL & MGMT  07/22/2021   Patient Active Problem List   Diagnosis Date Noted   Aneurysm, cerebral, nonruptured 08/13/2021   Status post stroke due to cerebrovascular disease    Cerebral edema ([place]) 04/29/2021   Stroke (cerebrum) ([place]) 04/29/2021   Right sided weakness    Acute CVA (cerebrovascular accident) ([place]) 04/27/2021   Primary osteoarthritis of left knee 01/13/2019   Primary osteoarthritis of right knee 01/13/2019   Flat foot 11/28/2017   Tendonitis of foot 11/28/2017   Polycythemia 05/12/2017   Vitamin D deficiency 04/21/2017   Bony sclerosis 08/05/2016   Lung nodule    Chronic pain of both knees 12/05/2015   Gastroesophageal reflux disease without esophagitis 05/17/2014   Essential hypertension 05/17/2014   Coronary artery calcification 08/17/2012   Abnormal LFTs (liver function tests) 07/11/2012   Chronic abdominal pain 06/24/2012   Right-sided chest wall pain 06/24/2012   Constipation, chronic 06/24/2012   Hyperbilirubinemia 06/24/2012   Chronic back pain 06/24/2012   DDD (degenerative disc disease), thoracolumbar 06/24/2012   History of nephrolithiasis 06/24/2012   Side pain 06/24/2012   Low back pain at multiple sites 06/24/2012    PCP: Charlane Ferretti  [place]  REFERRING PROVIDER: [name]  REFERRING DIAG: M25.511  THERAPY DIAG:  Hemiplegia and hemiparesis following cerebral infarction affecting right dominant side (HCC)  Acute pain of right shoulder  Muscle weakness (generalized)  Other abnormalities of gait and mobility  Other symptoms and signs involving the nervous system  Cerebrovascular accident (CVA) due to embolism of left middle cerebral artery ([place])  Rationale for Evaluation and Treatment Rehabilitation  ONSET DATE: 07/15/21  SUBJECTIVE:  SUBJECTIVE STATEMENT: Doing well, but R shoulder pain is more pronounced per patient and OT. He is wearing a sling on R.  PERTINENT HISTORY: Acute CVA 3/19  PAIN:  Are you having pain? No  PRECAUTIONS: Fall  WEIGHT BEARING RESTRICTIONS No  FALLS:  Has patient fallen in last 6 months? Yes. Number of falls 4  LIVING ENVIRONMENT: Lives with: lives with their family Lives in: House/apartment Stairs: Yes: External: 2 steps; none Has following equipment at home: Single point cane, Hemi walker, Wheelchair (manual), and Grab bars  OCCUPATION: retired  PLOF: [place] try to get stronger, try to use my legs and feet   OBJECTIVE:   DIAGNOSTIC FINDINGS:  Right shoulder 3 views: Shoulder is well located.  Subacromial space well-maintained.  Type II downsloping acromion.  No acute fractures or acute findings Right wrist 3 views: No acute fractures.  No subluxation dislocation of  the carpal bones.  Wrist joints well-maintained.  No bony abnormalities.  PATIENT SURVEYS:  FOTO 4  COGNITION:  Overall cognitive status: Within functional limits for tasks assessed     SENSATION: WFL  POSTURE: Decreased R shoulder height, hemiparesis, forward head, flexed trunk, R lateral  lean  UPPER EXTREMITY ROM:   Passive ROM Right eval Left eval  Shoulder flexion 60d w/pain   Shoulder extension    Shoulder abduction 80d w/pain    Shoulder adduction    Shoulder internal rotation Full w/end range pain   Shoulder external rotation 40d w/pain   Elbow flexion full   Elbow extension full   Wrist flexion    Wrist extension    Wrist ulnar deviation    Wrist radial deviation    Wrist pronation    Wrist supination    (Blank rows = not tested)  UPPER EXTREMITY MMT: L WFL  MMT Right eval Left eval  Shoulder flexion 1   Shoulder extension    Shoulder abduction    Shoulder adduction 2-   Shoulder internal rotation 2-   Shoulder external rotation 2-   Middle trapezius    Lower trapezius    Elbow flexion 2+   Elbow extension 2-   Wrist flexion    Wrist extension    Wrist ulnar deviation    Wrist radial deviation    Wrist pronation    Wrist supination    Grip strength (lbs)    (Blank rows = not tested)  LE MMT 3/5 hip flexion, knee ext, knee flexion  Using hemiwalker, decrease R foot clearance, R foot ER, decrease hip flexion, decreased stance time on R LE, decreased step length, foot drop  SHOULDER SPECIAL TESTS:  Impingement tests: Neer impingement test: positive , Hawkins/Kennedy impingement test: positive , and Painful arc test: positive     Instability tests: Apprehension test: positive  and Sulcus sign: positive   JOINT MOBILITY TESTING:  Pain with PROM, muscle guarding, instability in R shoulder, scapular winging  PALPATION:  No TTP   FUNCTIONAL MEASURES Eval BERG 24/56 TUG 57.28s--slowed gait using hemiwalker, no foot clearance and decreased safety awareness esp with turns 5xSTS-- 25.72s, LUE push off, decreased control  w/descending   TODAY'S TREATMENT:  08/19/21 NM re-ed for R pelvis and shoulder girdle-Sidelie, R pelvic mobility-ant/post, sup/inf, and into diagonals, min TC and VC for correct techqniue and isolated movement. He then  mobilized R scapula in same planes, with more limited movement, moved to active shoulder protraction and retraction and active hip abd with forward and back swing to engage motor  stability. Moved to sit and continued to perform reaching activities to engage R scapula and shoulder.He then moved to steps and performed step ups x 10 with RLE on 4" step, min A to facilitate correct weight shift and active RLE control.  Ambulated without AD, CGA, VC to increase strep length, no unsteadiness noted.  08/18/21 NuStep L3 x 6 min S2S x10, initially using LUE then progressing to none then 2x5 with lower elevation LAQ RLE 3# 2x10 Hamstring curls 20# 2x10, RLE 10# 2x10 Ambulate one lap with hemiwalker Toe taps on 6" box 1x10 using hemiwalker  08/14/21 NuStep L3 x5 min LE only S2S 3x5 LUE assist elevated mat Alt box taps 6in then 8in w/ hemi walker x10 each  Steps 4 in 9 steps alt pattern one rails L  Attempted 6 inch steps RLE did give out on him when ascending requiring Max assist to regain balance. Hamstring curls 20lb 2x10, RLE 10lb x10 Leg Ext 10lb x10, 5lb x10  LAQ RLE 3lb 2x10    08/07/21 Amb with shopping cart 1 x 75'. Patient demonstrated step through gait, RLE with increased flexor moment at hip and knee in stance, but stable. Nu-Step 3 min at L4, then 3 x 30 sec at L7, legs only, focused push. Heel raises with BUE support on shopping cart. Step ups on 4" step, BUE support LLE step from floor to third step an dback, x 10 STM to ITB on R due to report of R knee pain. F/B stretch to ITB. L sidelie  with R hip abd, moving R hip forward and back while holding the abd. Supine U bridge on R. BERG TUG  5xSTS  Nustep L2, x26mn  Hamstring curls redTB 2x10  LAQ 2x10 Heel taps 4" 2HHA support by stairs  PATIENT EDUCATION: Education details: POC Person educated: Patient and brother Education method: ECustomer service managerEducation comprehension: verbalized understanding   HOME  EXERCISE PROGRAM: TBD   ASSESSMENT:  CLINICAL IMPRESSION:     Pt reports increased R shoulder pain today, appears to be related to tone and spasms with attempted mobilization. Focused on R pelvic and scapular control and mobility to improve activation on R side, progressed to incorporate gains into active movements.  OBJECTIVE IMPAIRMENTS Abnormal gait, decreased mobility, difficulty walking, decreased ROM, decreased strength, decreased safety awareness, and pain.   ACTIVITY LIMITATIONS carrying, lifting, transfers, bathing, toileting, dressing, reach over head, and locomotion level  PARTICIPATION LIMITATIONS: cleaning, laundry, driving, community activity, and yard work  PERSONAL FACTORS Age, Time since onset of injury/illness/exacerbation, and 3+ comorbidities: chronic pain, acute CVA, stroke, OA  are also affecting patient's functional outcome.   REHAB POTENTIAL: Fair due to time since stroke and increased deficits on R side  CLINICAL DECISION MAKING: Evolving/moderate complexity  EVALUATION COMPLEXITY: Moderate   GOALS: Goals reviewed with patient? No  SHORT TERM GOALS: Target date: 09/08/21 Patient will be independent with initial HEP.  Goal status: ongoing  2.  Patient will be educated on strategies to decrease risk of falls.  Goal status: ongoing   LONG TERM GOALS: Target date: 10/06/21  1.  Patient will report 75% improvement in R shoulder pain to improve QOL.  Goal status: INITIAL  2.  Patient to demonstrate improved upright posture with posterior shoulder girdle engaged to promote improved glenohumeral joint mobility. Baseline: decreased shoulder height on R side, decreased shoulder stability, scapular winging  Goal status: INITIAL  3.  Patient to improve R shoulder AROM to  within patient's functional norm  without pain provocation to allow for increased ease of ADLs.  Baseline: measured w/PROM Goal status: INITIAL  4.  Patient will demonstrate improved  functional UE and LE strength as demonstrated by >= 4/5 in all muscle groups . Goal status: INITIAL  5.  Patient will report 43 on FOTO(patient outcome measure)  to demonstrate improved functional ability.  Baseline: 4 Goal status: INITIAL  6. Patient will score 34 or greater on Berg Balance test to demonstrate lower risk of falls. (MCID= 8 points) .  Baseline: 24 Goal status: INITIAL  7. Patient will demonstrate decreased fall risk by scoring < 25 sec on TUG and <20 on 5xSTS. Baseline: 57.28s, 25.72s Goal status: INITIAL     PLAN: PT FREQUENCY: 3x/week  PT DURATION: 8 weeks  PLANNED INTERVENTIONS: Therapeutic exercises, Therapeutic activity, Neuromuscular re-education, Balance training, Gait training, Patient/Family education, Joint mobilization, Stair training, Orthotic/Fit training, and Manual therapy  PLAN FOR NEXT SESSION: NM re-ed  Ethel Rana DPT 08/19/21 12:16 PM  08/19/2021, 12:16 PM

## 2021-08-19 NOTE — Therapy (Signed)
OUTPATIENT OCCUPATIONAL THERAPY TREATMENT NOTE  Patient Name: Aaron Dennis MRN: [id number] DOB:12/07/1955, 66 y.o., male Today's Date: 08/19/2021  PCP: Charlott Rakes, MD REFERRING PROVIDER: Pete Pelt, PA-C   OT End of Session - 08/19/21 0941     Visit Number 6    Number of Visits 25    Date for OT Re-Evaluation 10/03/21    Authorization Type UHC Medicare; Medicaid [place] (secondary)    Authorization Time Period VL: [place]    Progress Note Due on Visit 10    OT Start Time [id number]   pt arrival time   OT Stop Time 1015    OT Time Calculation (min) 37 min    Activity Tolerance Patient tolerated treatment well    Behavior During Therapy WFL for tasks assessed/performed            Past Medical History:  Diagnosis Date   GERD (gastroesophageal reflux disease)    Hypertension    Lung nodule    7 mm LUL nodule   Past Surgical History:  Procedure Laterality Date   IR RADIOLOGIST EVAL & MGMT  07/02/2021   IR RADIOLOGIST EVAL & MGMT  07/22/2021   Patient Active Problem List   Diagnosis Date Noted   Aneurysm, cerebral, nonruptured 08/13/2021   Status post stroke due to cerebrovascular disease    Cerebral edema ([place]) 04/29/2021   Stroke (cerebrum) ([place]) 04/29/2021   Right sided weakness    Acute CVA (cerebrovascular accident) ([place]) 04/27/2021   Primary osteoarthritis of left knee 01/13/2019   Primary osteoarthritis of right knee 01/13/2019   Flat foot 11/28/2017   Tendonitis of foot 11/28/2017   Polycythemia 05/12/2017   Vitamin D deficiency 04/21/2017   Bony sclerosis 08/05/2016   Lung nodule    Chronic pain of both knees 12/05/2015   Gastroesophageal reflux disease without esophagitis 05/17/2014   Essential hypertension 05/17/2014   Coronary artery calcification 08/17/2012   Abnormal LFTs (liver function tests) 07/11/2012   Chronic abdominal pain 06/24/2012   Right-sided chest wall pain 06/24/2012   Constipation, chronic 06/24/2012   Hyperbilirubinemia 06/24/2012    Chronic back pain 06/24/2012   DDD (degenerative disc disease), thoracolumbar 06/24/2012   History of nephrolithiasis 06/24/2012   Side pain 06/24/2012   Low back pain at multiple sites 06/24/2012    ONSET DATE: 07/17/21 (date of OT order)  REFERRING DIAG: M25.531 (ICD-10-CM) - Pain in right wrist M25.511 (ICD-10-CM) - Acute pain of right shoulder   THERAPY DIAG:  Hemiplegia and hemiparesis following cerebral infarction affecting right dominant side (HCC)  Acute pain of right shoulder  Muscle weakness (generalized)  Pain in right wrist  Other symptoms and signs involving the nervous system  Rationale for Evaluation and Treatment Rehabilitation  SUBJECTIVE:   SUBJECTIVE STATEMENT: "I'm feeling better today" Pt accompanied by: self and family member (brother, Selinda Michaels)  PAIN: Are you having pain? Yes: NPRS scale: 6/10 Pain location: scapula (anteriomedial border), anterior forearm, and a little in the wrist Pain description: "sharp" Aggravating factors: movement Relieving factors: OTC medication/cream  PERTINENT HISTORY: L frontoparietal CVA w/ residual R-sided weakness, presented to ED 04/27/21 after recent prolonged travel; PMH includes h/o polycythemia, HTN  PRECAUTIONS: Fall  PLOF: Independent and Vocation/Vocational requirements: retired Architect  PATIENT GOALS "Work on my shoulder"   OBJECTIVE:   UPPER EXTREMITY ROM: LUE WFL; RUE measurements below  Active ROM Right Eval - 6/26  Shoulder flexion Unable  Shoulder abduction Unable  Shoulder adduction WFL  Shoulder extension 24  Shoulder  internal rotation [facility]  Shoulder external rotation Unable  Elbow flexion WFL  Elbow extension 33  Wrist flexion 27  Wrist extension 28  Wrist pronation To be assessed  Wrist supination To be assessed  (Blank rows = not tested)  UPPER EXTREMITY MMT:     MMT Right Eval - 6/26  Shoulder flexion 1/5  Shoulder abduction 1/5  Shoulder extension 3-/5  Shoulder  internal rotation 2/5  Shoulder external rotation 2-/5  Elbow flexion 3/5  Elbow extension 2-/5  Wrist flexion 2-/5  Wrist extension 2-/5  Wrist pronation 2+/5  Wrist supination 2+/5  (Blank rows = not tested)   TODAY'S TREATMENT - 08/19/21: NMR of R elbow flexion/extension in supine position against light resistance (1.5 lb wrist weight around distal forearm). Completed x10 each direction w/ OT providing verbal cues for activation and positioning prn; most difficulty w/ control of elbow flexion past 90 deg angle w/ gravity. Pt demonstrated decreased speed w/ increased repetitions. Attempted PROM of R shoulder ER while in supine position, but pt was unable to tolerate due to pain; attempted AROM w/ similar results. Completed gentle massage to anterior elbow, then attempted previously successful PROM of R shoulder flexion. Therapist able to achieve flexion to chest height w/out pain in first 2 reps; increased pain w/ increased repetition despite varying levels of support and facilitation at shoulder and activity was also d/c Picking up 1" blocks w/ R hand and placing on L side with min cues for facilitation of typical movement patterns/decreased compensatory movements and OT graded task prn Thumb to finger opposition w/ digits 2-4 complete x5 slowly; OT provided verbal cues to extend hand fully after each rep w/ pt returning demonstration of exercise w/ min difficulty   PATIENT EDUCATION: Ongoing condition-specific education; emphasizing importance of using RUE as much as possible and avoiding compensation w/ LUE. Also reviewed spasticity due to CVA as well as options for management. Person educated: Patient Education method: Explanation Education comprehension: verbalized understanding   HOME EXERCISE PROGRAM: Access Code: [email or web address] - Supine Shoulder Flexion AAROM with Hands Clasped  - 3 x daily - 7 x weekly - 1 sets - 10 reps - Seated Elbow Flexion and Extension AROM  - 3 x daily - 7 x  weekly - 2 sets - 10 reps - Seated Forearm Pronation and Supination AROM  - 3 x daily - 7 x weekly - 2 sets - 10 reps - Wrist AROM Flexion Extension  - 3 x daily - 7 x weekly - 2 sets - 10 reps   GOALS: Goals reviewed with patient? Yes  SHORT TERM GOALS: Target date: 09/01/21  STG  Status:  1 Pt will demonstrate understanding of initial HEP designed for RUE ROM Baseline: No HEP at this time Progressing  2 Pt to improve R shoulder flexion strength to at least 2-/5 (partial ROM, gravity min) Baseline: shoulder flex MMT: 1/5 Progressing  3 Pt will be able to grasp and lift lightweight object to mouth w/ R, dominant UE in at least 2 trials Baseline: Significantly limited gross grasp Progressing  4 Pt will achieve at least 50% of R wrist extension AROM against gravity Baseline: partial ROM in horizontal plane Progressing     LONG TERM GOALS: Target date: 10/03/21  LTG  Status:  1 Pt will improve FOTO score to at least 45 to indicate improved participation in functional activities Baseline: Intake - 4 Progressing  2 Pt will be able to achieve at least partial AROM of R shoulder  flexion against gravity by discharge for improved functional use of RUE Baseline: shoulder flex MMT: 1/5 Progressing  3 Pt will be able to complete at least 10 blocks during Box and Blocks Test to indicated improved unilateral GMC of R, dominant UE Baseline: Unable at time of evaluation Progressing  4 Pt will be able to demonstrate incorporation of RUE as gross assist in at least 1 functional task (pulling up pants, stabilizing plate, etc.) Baseline: Not incorporating LUE at gross level at time of eval Progressing     ASSESSMENT:  CLINICAL IMPRESSION: Pt continued to be limited by shoulder pain this session. OT attempted gentle PROM due to report of decreased pain, but pt was unable to tolerate w/ increased pain reported w/ increased repetitions. Integrity of shoulder joint and scapular winging seems to be  slightly improved, but pt is experiencing increased spasticity. This, in combination w/ decreased use of RUE since onset, are likely contributing to pain. OT encouraged pt follow up w/ PM&R physician if pain persists. Due to this, OT turned focus to elbow and gross grasp and release w/ pt continuing to demonstrate improved use of hand and benefiting from consistent cues to decrease reliance on LUE contributing to learned nonuse.  PERFORMANCE DEFICITS in functional skills including ADLs, IADLs, coordination, dexterity, proprioception, sensation, tone, ROM, strength, pain, FMC, GMC, mobility, balance, body mechanics, decreased knowledge of use of DME, and UE functional use, cognitive skills including safety awareness, and psychosocial skills including environmental adaptation.   IMPAIRMENTS are limiting patient from ADLs, IADLs, rest and sleep, and work.   COMORBIDITIES may have co-morbidities  that affects occupational performance. Patient will benefit from skilled OT to address above impairments and improve overall function.   PLAN: OT FREQUENCY: 3x/week  OT DURATION: 8 weeks  PLANNED INTERVENTIONS: self care/ADL training, therapeutic exercise, therapeutic activity, neuromuscular re-education, manual therapy, passive range of motion, balance training, functional mobility training, aquatic therapy, splinting, electrical stimulation, ultrasound, iontophoresis, moist heat, cryotherapy, patient/family education, and DME and/or AE instructions  RECOMMENDED OTHER SERVICES: Currently receiving PT services at this location  CONSULTED AND AGREED WITH PLAN OF CARE: Patient and family member/caregiver  PLAN FOR NEXT SESSION: Scapular stability (e.g., supine, retraction/depression, rows); continue to address NMR of RUE [place] and functional grasp and release   Kathrine Cords, MSOT, OTR/L 08/19/2021, 10:42 PM

## 2021-08-21 ENCOUNTER — Encounter: Payer: Self-pay | Admitting: Occupational Therapy

## 2021-08-21 ENCOUNTER — Ambulatory Visit: Payer: Medicare Other | Admitting: Occupational Therapy

## 2021-08-21 ENCOUNTER — Ambulatory Visit: Payer: Medicare Other | Admitting: Physical Therapy

## 2021-08-21 VITALS — BP 118/85 | HR 73

## 2021-08-21 DIAGNOSIS — R2689 Other abnormalities of gait and mobility: Secondary | ICD-10-CM

## 2021-08-21 DIAGNOSIS — M25531 Pain in right wrist: Secondary | ICD-10-CM

## 2021-08-21 DIAGNOSIS — R29818 Other symptoms and signs involving the nervous system: Secondary | ICD-10-CM

## 2021-08-21 DIAGNOSIS — G8929 Other chronic pain: Secondary | ICD-10-CM | POA: Diagnosis not present

## 2021-08-21 DIAGNOSIS — I63412 Cerebral infarction due to embolism of left middle cerebral artery: Secondary | ICD-10-CM

## 2021-08-21 DIAGNOSIS — I69351 Hemiplegia and hemiparesis following cerebral infarction affecting right dominant side: Secondary | ICD-10-CM

## 2021-08-21 DIAGNOSIS — M6281 Muscle weakness (generalized): Secondary | ICD-10-CM

## 2021-08-21 DIAGNOSIS — M25511 Pain in right shoulder: Secondary | ICD-10-CM | POA: Diagnosis not present

## 2021-08-21 DIAGNOSIS — R293 Abnormal posture: Secondary | ICD-10-CM | POA: Diagnosis not present

## 2021-08-21 DIAGNOSIS — M545 Low back pain, unspecified: Secondary | ICD-10-CM | POA: Diagnosis not present

## 2021-08-21 DIAGNOSIS — Z8673 Personal history of transient ischemic attack (TIA), and cerebral infarction without residual deficits: Secondary | ICD-10-CM

## 2021-08-21 NOTE — Therapy (Signed)
OUTPATIENT OCCUPATIONAL THERAPY TREATMENT NOTE  Patient Name: Aaron Dennis MRN: [id number] DOB:15-Dec-1955, 66 y.o., male Today's Date: 08/21/2021  PCP: Charlott Rakes, MD REFERRING PROVIDER: Pete Pelt, PA-C   OT End of Session - 08/21/21 1107     Visit Number 7    Number of Visits 25    Date for OT Re-Evaluation 10/03/21    Authorization Type UHC Medicare; Medicaid [place] (secondary)    Authorization Time Period VL: [place]    Progress Note Due on Visit 10    OT Start Time 1101    OT Stop Time 1143    OT Time Calculation (min) 42 min    Activity Tolerance Patient tolerated treatment well    Behavior During Therapy WFL for tasks assessed/performed            Past Medical History:  Diagnosis Date   GERD (gastroesophageal reflux disease)    Hypertension    Lung nodule    7 mm LUL nodule   Past Surgical History:  Procedure Laterality Date   IR RADIOLOGIST EVAL & MGMT  07/02/2021   IR RADIOLOGIST EVAL & MGMT  07/22/2021   Patient Active Problem List   Diagnosis Date Noted   Aneurysm, cerebral, nonruptured 08/13/2021   Status post stroke due to cerebrovascular disease    Cerebral edema ([place]) 04/29/2021   Stroke (cerebrum) ([place]) 04/29/2021   Right sided weakness    Acute CVA (cerebrovascular accident) ([place]) 04/27/2021   Primary osteoarthritis of left knee 01/13/2019   Primary osteoarthritis of right knee 01/13/2019   Flat foot 11/28/2017   Tendonitis of foot 11/28/2017   Polycythemia 05/12/2017   Vitamin D deficiency 04/21/2017   Bony sclerosis 08/05/2016   Lung nodule    Chronic pain of both knees 12/05/2015   Gastroesophageal reflux disease without esophagitis 05/17/2014   Essential hypertension 05/17/2014   Coronary artery calcification 08/17/2012   Abnormal LFTs (liver function tests) 07/11/2012   Chronic abdominal pain 06/24/2012   Right-sided chest wall pain 06/24/2012   Constipation, chronic 06/24/2012   Hyperbilirubinemia 06/24/2012   Chronic back pain  06/24/2012   DDD (degenerative disc disease), thoracolumbar 06/24/2012   History of nephrolithiasis 06/24/2012   Side pain 06/24/2012   Low back pain at multiple sites 06/24/2012    ONSET DATE: 07/17/21 (date of OT order)  REFERRING DIAG: M25.531 (ICD-10-CM) - Pain in right wrist M25.511 (ICD-10-CM) - Acute pain of right shoulder   THERAPY DIAG:  Hemiplegia and hemiparesis following cerebral infarction affecting right dominant side (HCC)  Acute pain of right shoulder  Pain in right wrist  Muscle weakness (generalized)  Other symptoms and signs involving the nervous system  Rationale for Evaluation and Treatment Rehabilitation  SUBJECTIVE:   SUBJECTIVE STATEMENT: Pt reports he had a busy day yesterday with a lot of people in his house Pt accompanied by: self and family member (brother, Selinda Michaels)  PAIN: Are you having pain? Yes: NPRS scale: 4/10 Pain location: scapula (anteriomedial border), anterior forearm, and a little in the wrist Pain description: "sharp with movement" Aggravating factors: movement Relieving factors: OTC medication/cream  PERTINENT HISTORY: L frontoparietal CVA w/ residual R-sided weakness, presented to ED 04/27/21 after recent prolonged travel; PMH includes h/o polycythemia, HTN  PRECAUTIONS: Fall  PLOF: Independent and Vocation/Vocational requirements: retired Architect  PATIENT GOALS "Work on my shoulder"   OBJECTIVE:   UPPER EXTREMITY ROM: LUE WFL; RUE measurements below  Active ROM Right Eval - 6/26  Shoulder flexion Unable  Shoulder abduction Unable  Shoulder adduction WFL  Shoulder extension 24  Shoulder internal rotation [facility]  Shoulder external rotation Unable  Elbow flexion WFL  Elbow extension 33  Wrist flexion 27  Wrist extension 28  Wrist pronation To be assessed  Wrist supination To be assessed  (Blank rows = not tested)  UPPER EXTREMITY MMT:     MMT Right Eval - 6/26  Shoulder flexion 1/5  Shoulder abduction 1/5   Shoulder extension 3-/5  Shoulder internal rotation 2/5  Shoulder external rotation 2-/5  Elbow flexion 3/5  Elbow extension 2-/5  Wrist flexion 2-/5  Wrist extension 2-/5  Wrist pronation 2+/5  Wrist supination 2+/5  (Blank rows = not tested)   TODAY'S TREATMENT - 08/21/21: Self-ROM of shoulder flexion while seated w/ hands clasped x5 w/ increased pain past approx 75 deg OT-facilitated gentle PROM of L shoulder ER; significant hypertonicity w/ OT unable to achieve ER to neutral consistently w/out pain. Despite varying levels of support provided to shoulder and UE, pt continued to c/o pain and exercise was d/c with no notable improvement in pain w/ increased reps AROM of bilateral shoulder shrugs 2x10 in front of mirror for scapular stability and equal activation; increased success w/ mirror as visual aid vs verbal cues/modeling only BUE shoulder rolls 2x10 w/ OT emphasizing scapular retraction w/ pt in front of mirror to facilitate consistent equal activation during exercise AAROM of external rotation w/ unweighted dowel and OT providing support under forearm. Pt seated upright in chair and was able to achieve about 50% of ROM consistently w/out increased pain. Removing cylindrical graded pegs (1.25" to 2.875") using R hand gross grasp and release w/ mod difficulty. Completed 2 sets of 15 and then 20 pegs. OT provided verbal and occ tactile cueing for typical movement patterns/decreased compensatory patterns and full wrist, hand, finger extension for release and prior to grasp. Incorporated low-mid level functional reach w/ pt success removing pegs improving w/ increased repetition.   PATIENT EDUCATION: Ongoing condition-specific education; emphasizing importance of using RUE as much as possible and avoiding compensation w/ LUE Person educated: Patient Education method: Explanation Education comprehension: verbalized understanding   HOME EXERCISE PROGRAM: MedBridge Code: [id number] -  Seated Shoulder Shrugs  - 3 x daily - 7 x weekly - 1 sets - 15 reps - Seated Shoulder Shrug Circles AROM Backward  - 3 x daily - 7 x weekly - 1 sets - 15 reps - Seated Scapular Retraction  - 3 x daily - 7 x weekly - 1 sets - 15 reps - Seated Shoulder External Rotation AAROM with Dowel  - 3 x daily - 7 x weekly - 1 sets - 10 reps - Supine Shoulder Flexion AAROM with Hands Clasped  - 3 x daily - 7 x weekly - 1 sets - 10 reps - Seated Elbow Flexion and Extension AROM  - 3 x daily - 7 x weekly - 2 sets - 10 reps - Seated Forearm Pronation and Supination AROM  - 3 x daily - 7 x weekly - 2 sets - 10 reps - Wrist AROM Flexion Extension  - 3 x daily - 7 x weekly - 2 sets - 10 reps   GOALS: Goals reviewed with patient? Yes  SHORT TERM GOALS: Target date: 09/01/21  STG  Status:  1 Pt will demonstrate understanding of initial HEP designed for RUE ROM Baseline: No HEP at this time Progressing  2 Pt to improve R shoulder flexion strength to at least 2-/5 (partial ROM, gravity min) Baseline:  shoulder flex MMT: 1/5 Progressing  3 Pt will be able to grasp and lift lightweight object to mouth w/ R, dominant UE in at least 2 trials Baseline: Significantly limited gross grasp Progressing  4 Pt will achieve at least 50% of R wrist extension AROM against gravity Baseline: partial ROM in horizontal plane Progressing     LONG TERM GOALS: Target date: 10/03/21  LTG  Status:  1 Pt will improve FOTO score to at least 45 to indicate improved participation in functional activities Baseline: Intake - 4 Progressing  2 Pt will be able to achieve at least partial AROM of R shoulder flexion against gravity by discharge for improved functional use of RUE Baseline: shoulder flex MMT: 1/5 Progressing  3 Pt will be able to complete at least 10 blocks during Box and Blocks Test to indicated improved unilateral GMC of R, dominant UE Baseline: Unable at time of evaluation Progressing  4 Pt will be able to demonstrate  incorporation of RUE as gross assist in at least 1 functional task (pulling up pants, stabilizing plate, etc.) Baseline: Not incorporating LUE at gross level at time of eval Progressing     ASSESSMENT:  CLINICAL IMPRESSION: Pt continued to be limited by shoulder pain this session, but demonstrated improved tolerance to AAROM this session compared to prior sessions. OT focused treatment on scapular stability and equal activation of shoulder elevators due to R shoulder subluxation likely contributing to pain and/or impacting functional use of RUE. Then moving to gross grasp and release, w/ pt able to retrieve all sizes of cylindrical pegs, requiring only min cues for problem-solving positioning adjustments and extended time. Pt continues to benefit from consistent cues to decrease reliance on LUE, which is likely contributing to learned nonuse of affected RUE.  PERFORMANCE DEFICITS in functional skills including ADLs, IADLs, coordination, dexterity, proprioception, sensation, tone, ROM, strength, pain, FMC, GMC, mobility, balance, body mechanics, decreased knowledge of use of DME, and UE functional use, cognitive skills including safety awareness, and psychosocial skills including environmental adaptation.   IMPAIRMENTS are limiting patient from ADLs, IADLs, rest and sleep, and work.   COMORBIDITIES may have co-morbidities  that affects occupational performance. Patient will benefit from skilled OT to address above impairments and improve overall function.   PLAN: OT FREQUENCY: 3x/week  OT DURATION: 8 weeks  PLANNED INTERVENTIONS: self care/ADL training, therapeutic exercise, therapeutic activity, neuromuscular re-education, manual therapy, passive range of motion, balance training, functional mobility training, aquatic therapy, splinting, electrical stimulation, ultrasound, iontophoresis, moist heat, cryotherapy, patient/family education, and DME and/or AE instructions  RECOMMENDED OTHER  SERVICES: Currently receiving PT services at this location  CONSULTED AND AGREED WITH PLAN OF CARE: Patient and family member/caregiver  PLAN FOR NEXT SESSION: Scapular stability (e.g., supine, retraction/depression, rows); continue to address NMR of RUE [place] and functional grasp and release   Kathrine Cords, MSOT, OTR/L 08/21/2021, 11:08 AM

## 2021-08-21 NOTE — Therapy (Signed)
OUTPATIENT PHYSICAL THERAPY TREATMENT   Patient Name: Aaron Dennis MRN: [id number] DOB:12-18-1955, 66 y.o., male Today's Date: 08/21/2021   PT End of Session - 08/21/21 1059     Visit Number 7    Date for PT Re-Evaluation 10/06/21    PT Start Time 1017    PT Stop Time 1056    PT Time Calculation (min) 39 min    Activity Tolerance Patient tolerated treatment well;Patient limited by pain    Behavior During Therapy [facility] for tasks assessed/performed                 Past Medical History:  Diagnosis Date   GERD (gastroesophageal reflux disease)    Hypertension    Lung nodule    7 mm LUL nodule   Past Surgical History:  Procedure Laterality Date   IR RADIOLOGIST EVAL & MGMT  07/02/2021   IR RADIOLOGIST EVAL & MGMT  07/22/2021   Patient Active Problem List   Diagnosis Date Noted   Aneurysm, cerebral, nonruptured 08/13/2021   Status post stroke due to cerebrovascular disease    Cerebral edema ([place]) 04/29/2021   Stroke (cerebrum) ([place]) 04/29/2021   Right sided weakness    Acute CVA (cerebrovascular accident) ([place]) 04/27/2021   Primary osteoarthritis of left knee 01/13/2019   Primary osteoarthritis of right knee 01/13/2019   Flat foot 11/28/2017   Tendonitis of foot 11/28/2017   Polycythemia 05/12/2017   Vitamin D deficiency 04/21/2017   Bony sclerosis 08/05/2016   Lung nodule    Chronic pain of both knees 12/05/2015   Gastroesophageal reflux disease without esophagitis 05/17/2014   Essential hypertension 05/17/2014   Coronary artery calcification 08/17/2012   Abnormal LFTs (liver function tests) 07/11/2012   Chronic abdominal pain 06/24/2012   Right-sided chest wall pain 06/24/2012   Constipation, chronic 06/24/2012   Hyperbilirubinemia 06/24/2012   Chronic back pain 06/24/2012   DDD (degenerative disc disease), thoracolumbar 06/24/2012   History of nephrolithiasis 06/24/2012   Side pain 06/24/2012   Low back pain at multiple sites 06/24/2012    PCP:  [name]  REFERRING PROVIDER: [name]  REFERRING DIAG: M25.511  THERAPY DIAG:  Abnormal posture  Hemiplegia and hemiparesis following cerebral infarction affecting right dominant side ([place])  Status post stroke due to cerebrovascular disease  Muscle weakness (generalized)  Other symptoms and signs involving the nervous system  Other abnormalities of gait and mobility  Cerebrovascular accident (CVA) due to embolism of left middle cerebral artery ([place])  Rationale for Evaluation and Treatment Rehabilitation  ONSET DATE: 07/15/21  SUBJECTIVE:  SUBJECTIVE STATEMENT: Doing well. R shoulder pain remains, but is better as long as he doesn't try to move it.  PERTINENT HISTORY: Acute CVA 3/19  PAIN:  Are you having pain? No  PRECAUTIONS: Fall  WEIGHT BEARING RESTRICTIONS No  FALLS:  Has patient fallen in last 6 months? Yes. Number of falls 4  LIVING ENVIRONMENT: Lives with: lives with their family Lives in: House/apartment Stairs: Yes: External: 2 steps; none Has following equipment at home: Single point cane, Hemi walker, Wheelchair (manual), and Grab bars  OCCUPATION: retired  PLOF: [place] try to get stronger, try to use my legs and feet   OBJECTIVE:   DIAGNOSTIC FINDINGS:  Right shoulder 3 views: Shoulder is well located.  Subacromial space well-maintained.  Type II downsloping acromion.  No acute fractures or acute findings Right wrist 3 views: No acute fractures.  No subluxation dislocation of  the carpal bones.  Wrist joints well-maintained.  No bony abnormalities.  PATIENT SURVEYS:  FOTO 4  COGNITION:  Overall cognitive status: Within functional limits for tasks assessed     SENSATION: WFL  POSTURE: Decreased R shoulder height, hemiparesis,  forward head, flexed trunk, R lateral lean  UPPER EXTREMITY ROM:   Passive ROM Right eval Left eval  Shoulder flexion 60d w/pain   Shoulder extension    Shoulder abduction 80d w/pain    Shoulder adduction    Shoulder internal rotation Full w/end range pain   Shoulder external rotation 40d w/pain   Elbow flexion full   Elbow extension full   Wrist flexion    Wrist extension    Wrist ulnar deviation    Wrist radial deviation    Wrist pronation    Wrist supination    (Blank rows = not tested)  UPPER EXTREMITY MMT: L WFL  MMT Right eval Left eval  Shoulder flexion 1   Shoulder extension    Shoulder abduction    Shoulder adduction 2-   Shoulder internal rotation 2-   Shoulder external rotation 2-   Middle trapezius    Lower trapezius    Elbow flexion 2+   Elbow extension 2-   Wrist flexion    Wrist extension    Wrist ulnar deviation    Wrist radial deviation    Wrist pronation    Wrist supination    Grip strength (lbs)    (Blank rows = not tested)  LE MMT 3/5 hip flexion, knee ext, knee flexion  Using hemiwalker, decrease R foot clearance, R foot ER, decrease hip flexion, decreased stance time on R LE, decreased step length, foot drop  SHOULDER SPECIAL TESTS:  Impingement tests: Neer impingement test: positive , Hawkins/Kennedy impingement test: positive , and Painful arc test: positive     Instability tests: Apprehension test: positive  and Sulcus sign: positive   JOINT MOBILITY TESTING:  Pain with PROM, muscle guarding, instability in R shoulder, scapular winging  PALPATION:  No TTP   FUNCTIONAL MEASURES Eval BERG 24/56 TUG 57.28s--slowed gait using hemiwalker, no foot clearance and decreased safety awareness esp with turns 5xSTS-- 25.72s, LUE push off, decreased control  w/descending   TODAY'S TREATMENT:  08/21/21 Patient reports L lateral knee pain. His ITB is very tight. Therapist performed STM to ITB followed by stretch in supine with knee to  opposite chest. Educated him to use tennis ball for further STM at home. Patient noted to overuse L side with all mobility, demosntrates decreased WB through RLE at all times, probably due to sensory  changes and weakness with patient not trusting RLE. Educated him to the need to incorporate RLE into all mobility. Therapist first facilitated lower trunk activation and mobilization sitting on physiodisc-performed ant/post, Lat, and then circular lower trunk motions, required min-mod TC for correct technique. Moved sit to stand, educating patient with VC and tactile facilitation to remain centered with equal WB through both legs. Facilitated RLE activation. Performed repeated sit to stand, required min-mod facilit  08/19/21 NM re-ed for R pelvis and shoulder girdle-Sidelie, R pelvic mobility-ant/post, sup/inf, and into diagonals, min TC and VC for correct techqniue and isolated movement. He then mobilized R scapula in same planes, with more limited movement, moved to active shoulder protraction and retraction and active hip abd with forward and back swing to engage motor stability. Moved to sit and continued to perform reaching activities to engage R scapula and shoulder.He then moved to steps and performed step ups x 10 with RLE on 4" step, min A to facilitate correct weight shift and active RLE control.  Ambulated without AD, CGA, VC to increase strep length, no unsteadiness noted.  08/18/21 NuStep L3 x 6 min S2S x10, initially using LUE then progressing to none then 2x5 with lower elevation LAQ RLE 3# 2x10 Hamstring curls 20# 2x10, RLE 10# 2x10 Ambulate one lap with hemiwalker Toe taps on 6" box 1x10 using hemiwalker  08/14/21 NuStep L3 x5 min LE only S2S 3x5 LUE assist elevated mat Alt box taps 6in then 8in w/ hemi walker x10 each  Steps 4 in 9 steps alt pattern one rails L  Attempted 6 inch steps RLE did give out on him when ascending requiring Max assist to regain balance. Hamstring curls 20lb  2x10, RLE 10lb x10 Leg Ext 10lb x10, 5lb x10  LAQ RLE 3lb 2x10    08/07/21 Amb with shopping cart 1 x 75'. Patient demonstrated step through gait, RLE with increased flexor moment at hip and knee in stance, but stable. Nu-Step 3 min at L4, then 3 x 30 sec at L7, legs only, focused push. Heel raises with BUE support on shopping cart. Step ups on 4" step, BUE support LLE step from floor to third step an dback, x 10 STM to ITB on R due to report of R knee pain. F/B stretch to ITB. L sidelie  with R hip abd, moving R hip forward and back while holding the abd. Supine U bridge on R. BERG TUG  5xSTS  Nustep L2, x46mn  Hamstring curls redTB 2x10  LAQ 2x10 Heel taps 4" 2HHA support by stairs  PATIENT EDUCATION: Education details: POC Person educated: Patient and brother Education method: ECustomer service managerEducation comprehension: verbalized understanding   HOME EXERCISE PROGRAM: TBD   ASSESSMENT:  CLINICAL IMPRESSION:     Pt reports that his R shoulder pain is somewhat improved, but only when he does not try to move the arm. Treatment focused on R NM Re-education and facilitation of more automatic WB and use of RLE with all mobility.  OBJECTIVE IMPAIRMENTS Abnormal gait, decreased mobility, difficulty walking, decreased ROM, decreased strength, decreased safety awareness, and pain.   ACTIVITY LIMITATIONS carrying, lifting, transfers, bathing, toileting, dressing, reach over head, and locomotion level  PARTICIPATION LIMITATIONS: cleaning, laundry, driving, community activity, and yard work  PERSONAL FACTORS Age, Time since onset of injury/illness/exacerbation, and 3+ comorbidities: chronic pain, acute CVA, stroke, OA  are also affecting patient's functional outcome.   REHAB POTENTIAL: Fair due to time since stroke and increased deficits on R  side  CLINICAL DECISION MAKING: Evolving/moderate complexity  EVALUATION COMPLEXITY: Moderate   GOALS: Goals reviewed with  patient? No  SHORT TERM GOALS: Target date: 09/08/21 Patient will be independent with initial HEP.  Goal status: ongoing  2.  Patient will be educated on strategies to decrease risk of falls.  Goal status: ongoing   LONG TERM GOALS: Target date: 10/06/21  1.  Patient will report 75% improvement in R shoulder pain to improve QOL.  Goal status: INITIAL  2.  Patient to demonstrate improved upright posture with posterior shoulder girdle engaged to promote improved glenohumeral joint mobility. Baseline: decreased shoulder height on R side, decreased shoulder stability, scapular winging  Goal status: ongoing  3.  Patient to improve R shoulder AROM to  within patient's functional norm  without pain provocation to allow for increased ease of ADLs.  Baseline: measured w/PROM Goal status: INITIAL  4.  Patient will demonstrate improved functional UE and LE strength as demonstrated by >= 4/5 in all muscle groups . Goal status: INITIAL  5.  Patient will report 41 on FOTO(patient outcome measure)  to demonstrate improved functional ability.  Baseline: 4 Goal status: INITIAL  6. Patient will score 34 or greater on Berg Balance test to demonstrate lower risk of falls. (MCID= 8 points) .  Baseline: 24 Goal status: INITIAL  7. Patient will demonstrate decreased fall risk by scoring < 25 sec on TUG and <20 on 5xSTS. Baseline: 57.28s, 25.72s Goal status: INITIAL     PLAN: PT FREQUENCY: 3x/week  PT DURATION: 8 weeks  PLANNED INTERVENTIONS: Therapeutic exercises, Therapeutic activity, Neuromuscular re-education, Balance training, Gait training, Patient/Family education, Joint mobilization, Stair training, Orthotic/Fit training, and Manual therapy  PLAN FOR NEXT SESSION: NM re-ed  Ethel Rana DPT 08/21/21 12:55 PM  08/21/2021, 12:55 PM

## 2021-08-22 NOTE — Therapy (Signed)
OUTPATIENT PHYSICAL THERAPY TREATMENT   Patient Name: Aaron Dennis MRN: [id number] DOB:12-12-1955, 66 y.o., male Today's Date: 08/25/2021   PT End of Session - 08/25/21 1024     Visit Number 8    Date for PT Re-Evaluation 10/06/21    PT Start Time 1023    PT Stop Time 1100    PT Time Calculation (min) 37 min    Equipment Utilized During Treatment Gait belt    Activity Tolerance Patient tolerated treatment well;Patient limited by pain    Behavior During Therapy WFL for tasks assessed/performed                  Past Medical History:  Diagnosis Date   GERD (gastroesophageal reflux disease)    Hypertension    Lung nodule    7 mm LUL nodule   Past Surgical History:  Procedure Laterality Date   IR RADIOLOGIST EVAL & MGMT  07/02/2021   IR RADIOLOGIST EVAL & MGMT  07/22/2021   Patient Active Problem List   Diagnosis Date Noted   Aneurysm, cerebral, nonruptured 08/13/2021   Status post stroke due to cerebrovascular disease    Cerebral edema ([place]) 04/29/2021   Stroke (cerebrum) ([place]) 04/29/2021   Right sided weakness    Acute CVA (cerebrovascular accident) ([place]) 04/27/2021   Primary osteoarthritis of left knee 01/13/2019   Primary osteoarthritis of right knee 01/13/2019   Flat foot 11/28/2017   Tendonitis of foot 11/28/2017   Polycythemia 05/12/2017   Vitamin D deficiency 04/21/2017   Bony sclerosis 08/05/2016   Lung nodule    Chronic pain of both knees 12/05/2015   Gastroesophageal reflux disease without esophagitis 05/17/2014   Essential hypertension 05/17/2014   Coronary artery calcification 08/17/2012   Abnormal LFTs (liver function tests) 07/11/2012   Chronic abdominal pain 06/24/2012   Right-sided chest wall pain 06/24/2012   Constipation, chronic 06/24/2012   Hyperbilirubinemia 06/24/2012   Chronic back pain 06/24/2012   DDD (degenerative disc disease), thoracolumbar 06/24/2012   History of nephrolithiasis 06/24/2012   Side pain 06/24/2012   Low back  pain at multiple sites 06/24/2012    PCP: [name]  REFERRING PROVIDER: [name]  REFERRING DIAG: M25.511  THERAPY DIAG:  Hemiplegia and hemiparesis following cerebral infarction affecting right dominant side ([place])  Status post stroke due to cerebrovascular disease  Muscle weakness (generalized)  Other symptoms and signs involving the nervous system  Other abnormalities of gait and mobility  Cerebrovascular accident (CVA) due to embolism of left middle cerebral artery ([place])  Abnormal posture  Rationale for Evaluation and Treatment Rehabilitation  ONSET DATE: 07/15/21  SUBJECTIVE:  SUBJECTIVE STATEMENT: Patient was rushing out of his house because he was late, he ended up falling when trying to come down 1 step. Reports he did not get hurt and did not hit his head. Pain remains in R shoulder and a little bit in his R hip   PERTINENT HISTORY: Acute CVA 3/19  PAIN:  Are you having pain? No  PRECAUTIONS: Fall  WEIGHT BEARING RESTRICTIONS No  FALLS:  Has patient fallen in last 6 months? Yes. Number of falls 4  LIVING ENVIRONMENT: Lives with: lives with their family Lives in: House/apartment Stairs: Yes: External: 2 steps; none Has following equipment at home: Single point cane, Hemi walker, Wheelchair (manual), and Grab bars  OCCUPATION: retired  PLOF: [place] try to get stronger, try to use my legs and feet   OBJECTIVE:   DIAGNOSTIC FINDINGS:  Right shoulder 3 views: Shoulder is well located.  Subacromial space well-maintained.  Type II downsloping acromion.  No acute fractures or acute findings Right wrist 3 views: No acute fractures.  No subluxation dislocation of  the carpal bones.  Wrist joints well-maintained.  No bony abnormalities.  PATIENT  SURVEYS:  FOTO 4  COGNITION:  Overall cognitive status: Within functional limits for tasks assessed     SENSATION: WFL  POSTURE: Decreased R shoulder height, hemiparesis, forward head, flexed trunk, R lateral lean  UPPER EXTREMITY ROM:   Passive ROM Right eval Left eval  Shoulder flexion 60d w/pain   Shoulder extension    Shoulder abduction 80d w/pain    Shoulder adduction    Shoulder internal rotation Full w/end range pain   Shoulder external rotation 40d w/pain   Elbow flexion full   Elbow extension full   Wrist flexion    Wrist extension    Wrist ulnar deviation    Wrist radial deviation    Wrist pronation    Wrist supination    (Blank rows = not tested)  UPPER EXTREMITY MMT: L WFL  MMT Right eval Left eval  Shoulder flexion 1   Shoulder extension    Shoulder abduction    Shoulder adduction 2-   Shoulder internal rotation 2-   Shoulder external rotation 2-   Middle trapezius    Lower trapezius    Elbow flexion 2+   Elbow extension 2-   Wrist flexion    Wrist extension    Wrist ulnar deviation    Wrist radial deviation    Wrist pronation    Wrist supination    Grip strength (lbs)    (Blank rows = not tested)  LE MMT 3/5 hip flexion, knee ext, knee flexion  Using hemiwalker, decrease R foot clearance, R foot ER, decrease hip flexion, decreased stance time on R LE, decreased step length, foot drop  SHOULDER SPECIAL TESTS:  Impingement tests: Neer impingement test: positive , Hawkins/Kennedy impingement test: positive , and Painful arc test: positive     Instability tests: Apprehension test: positive  and Sulcus sign: positive   JOINT MOBILITY TESTING:  Pain with PROM, muscle guarding, instability in R shoulder, scapular winging  PALPATION:  No TTP   FUNCTIONAL MEASURES Eval BERG 24/56 TUG 57.28s--slowed gait using hemiwalker, no foot clearance and decreased safety awareness esp with turns 5xSTS-- 25.72s, LUE push off, decreased control   w/descending   TODAY'S TREATMENT:  08/25/21 Nustep L6 x60mns HS curls 20# 2x10, RLE only #10 x10 Knee ext 10# x10, eccentric x10, 5# RLE only x10  Step ups 4" Walking w/o AD  in // bars forwards, backwards, side steps    08/21/21 Patient reports L lateral knee pain. His ITB is very tight. Therapist performed STM to ITB followed by stretch in supine with knee to opposite chest. Educated him to use tennis ball for further STM at home. Patient noted to overuse L side with all mobility, demosntrates decreased WB through RLE at all times, probably due to sensory changes and weakness with patient not trusting RLE. Educated him to the need to incorporate RLE into all mobility. Therapist first facilitated lower trunk activation and mobilization sitting on physiodisc-performed ant/post, Lat, and then circular lower trunk motions, required min-mod TC for correct technique. Moved sit to stand, educating patient with VC and tactile facilitation to remain centered with equal WB through both legs. Facilitated RLE activation. Performed repeated sit to stand, required min-mod facilit  08/19/21 NM re-ed for R pelvis and shoulder girdle-Sidelie, R pelvic mobility-ant/post, sup/inf, and into diagonals, min TC and VC for correct techqniue and isolated movement. He then mobilized R scapula in same planes, with more limited movement, moved to active shoulder protraction and retraction and active hip abd with forward and back swing to engage motor stability. Moved to sit and continued to perform reaching activities to engage R scapula and shoulder.He then moved to steps and performed step ups x 10 with RLE on 4" step, min A to facilitate correct weight shift and active RLE control.  Ambulated without AD, CGA, VC to increase strep length, no unsteadiness noted.  08/18/21 NuStep L3 x 6 min S2S x10, initially using LUE then progressing to none then 2x5 with lower elevation LAQ RLE 3# 2x10 Hamstring curls 20# 2x10, RLE 10#  2x10 Ambulate one lap with hemiwalker Toe taps on 6" box 1x10 using hemiwalker  08/14/21 NuStep L3 x5 min LE only S2S 3x5 LUE assist elevated mat Alt box taps 6in then 8in w/ hemi walker x10 each  Steps 4 in 9 steps alt pattern one rails L  Attempted 6 inch steps RLE did give out on him when ascending requiring Max assist to regain balance. Hamstring curls 20lb 2x10, RLE 10lb x10 Leg Ext 10lb x10, 5lb x10  LAQ RLE 3lb 2x10    08/07/21 Amb with shopping cart 1 x 75'. Patient demonstrated step through gait, RLE with increased flexor moment at hip and knee in stance, but stable. Nu-Step 3 min at L4, then 3 x 30 sec at L7, legs only, focused push. Heel raises with BUE support on shopping cart. Step ups on 4" step, BUE support LLE step from floor to third step an dback, x 10 STM to ITB on R due to report of R knee pain. F/B stretch to ITB. L sidelie  with R hip abd, moving R hip forward and back while holding the abd. Supine U bridge on R. BERG TUG  5xSTS  Nustep L2, x59mn  Hamstring curls redTB 2x10  LAQ 2x10 Heel taps 4" 2HHA support by stairs  PATIENT EDUCATION: Education details: POC Person educated: Patient and brother Education method: ECustomer service managerEducation comprehension: verbalized understanding   HOME EXERCISE PROGRAM: TBD   ASSESSMENT:  CLINICAL IMPRESSION:   Patient is moving slow today, had a fall prior to coming, no cuts or bruises present. He did not hit his head and remembers how it happened. Patient continues to presents with significant gait deviations and weakness in RLE. We focused on LE strengthening and gait training. He states his AFO came in and he is going to  buy new shoes today to try to wear it. Will bring it in next visit if he remembers.   OBJECTIVE IMPAIRMENTS Abnormal gait, decreased mobility, difficulty walking, decreased ROM, decreased strength, decreased safety awareness, and pain.   ACTIVITY LIMITATIONS carrying, lifting,  transfers, bathing, toileting, dressing, reach over head, and locomotion level  PARTICIPATION LIMITATIONS: cleaning, laundry, driving, community activity, and yard work  PERSONAL FACTORS Age, Time since onset of injury/illness/exacerbation, and 3+ comorbidities: chronic pain, acute CVA, stroke, OA  are also affecting patient's functional outcome.   REHAB POTENTIAL: Fair due to time since stroke and increased deficits on R side  CLINICAL DECISION MAKING: Evolving/moderate complexity  EVALUATION COMPLEXITY: Moderate   GOALS: Goals reviewed with patient? No  SHORT TERM GOALS: Target date: 09/08/21 Patient will be independent with initial HEP.  Goal status: ongoing  2.  Patient will be educated on strategies to decrease risk of falls.  Goal status: ongoing   LONG TERM GOALS: Target date: 10/06/21  1.  Patient will report 75% improvement in R shoulder pain to improve QOL.  Goal status: INITIAL  2.  Patient to demonstrate improved upright posture with posterior shoulder girdle engaged to promote improved glenohumeral joint mobility. Baseline: decreased shoulder height on R side, decreased shoulder stability, scapular winging  Goal status: ongoing  3.  Patient to improve R shoulder AROM to  within patient's functional norm  without pain provocation to allow for increased ease of ADLs.  Baseline: measured w/PROM Goal status: INITIAL  4.  Patient will demonstrate improved functional UE and LE strength as demonstrated by >= 4/5 in all muscle groups . Goal status: INITIAL  5.  Patient will report 12 on FOTO(patient outcome measure)  to demonstrate improved functional ability.  Baseline: 4 Goal status: INITIAL  6. Patient will score 34 or greater on Berg Balance test to demonstrate lower risk of falls. (MCID= 8 points) .  Baseline: 24 Goal status: INITIAL  7. Patient will demonstrate decreased fall risk by scoring < 25 sec on TUG and <20 on 5xSTS. Baseline: 57.28s, 25.72s Goal  status: INITIAL     PLAN: PT FREQUENCY: 3x/week  PT DURATION: 8 weeks  PLANNED INTERVENTIONS: Therapeutic exercises, Therapeutic activity, Neuromuscular re-education, Balance training, Gait training, Patient/Family education, Joint mobilization, Stair training, Orthotic/Fit training, and Manual therapy  PLAN FOR NEXT SESSION: NM re-ed  Andris Baumann, DPT 08/25/21 11:04 AM  08/25/2021, 11:04 AM

## 2021-08-25 ENCOUNTER — Ambulatory Visit: Payer: Medicare Other | Admitting: Occupational Therapy

## 2021-08-25 ENCOUNTER — Ambulatory Visit: Payer: Medicare Other

## 2021-08-25 ENCOUNTER — Encounter: Payer: Self-pay | Admitting: Occupational Therapy

## 2021-08-25 DIAGNOSIS — M6281 Muscle weakness (generalized): Secondary | ICD-10-CM

## 2021-08-25 DIAGNOSIS — R29818 Other symptoms and signs involving the nervous system: Secondary | ICD-10-CM | POA: Diagnosis not present

## 2021-08-25 DIAGNOSIS — I69351 Hemiplegia and hemiparesis following cerebral infarction affecting right dominant side: Secondary | ICD-10-CM | POA: Diagnosis not present

## 2021-08-25 DIAGNOSIS — R293 Abnormal posture: Secondary | ICD-10-CM | POA: Diagnosis not present

## 2021-08-25 DIAGNOSIS — I63412 Cerebral infarction due to embolism of left middle cerebral artery: Secondary | ICD-10-CM

## 2021-08-25 DIAGNOSIS — M25531 Pain in right wrist: Secondary | ICD-10-CM | POA: Diagnosis not present

## 2021-08-25 DIAGNOSIS — Z8673 Personal history of transient ischemic attack (TIA), and cerebral infarction without residual deficits: Secondary | ICD-10-CM

## 2021-08-25 DIAGNOSIS — G8929 Other chronic pain: Secondary | ICD-10-CM | POA: Diagnosis not present

## 2021-08-25 DIAGNOSIS — M25511 Pain in right shoulder: Secondary | ICD-10-CM

## 2021-08-25 DIAGNOSIS — M545 Low back pain, unspecified: Secondary | ICD-10-CM | POA: Diagnosis not present

## 2021-08-25 DIAGNOSIS — R2689 Other abnormalities of gait and mobility: Secondary | ICD-10-CM | POA: Diagnosis not present

## 2021-08-25 NOTE — Therapy (Unsigned)
OUTPATIENT OCCUPATIONAL THERAPY TREATMENT NOTE  Patient Name: Aaron Dennis MRN: [id number] DOB:12/20/1955, 66 y.o., male Today's Date: 08/25/2021  PCP: Charlott Rakes, MD REFERRING PROVIDER: Pete Pelt, PA-C   OT End of Session - 08/25/21 1103     Visit Number 8    Number of Visits 25    Date for OT Re-Evaluation 10/03/21    Authorization Type UHC Medicare; Medicaid [place] (secondary)    Authorization Time Period VL: [place]    Progress Note Due on Visit 10    OT Start Time 1102    OT Stop Time 1144    OT Time Calculation (min) 42 min    Activity Tolerance Patient tolerated treatment well    Behavior During Therapy WFL for tasks assessed/performed            Past Medical History:  Diagnosis Date   GERD (gastroesophageal reflux disease)    Hypertension    Lung nodule    7 mm LUL nodule   Past Surgical History:  Procedure Laterality Date   IR RADIOLOGIST EVAL & MGMT  07/02/2021   IR RADIOLOGIST EVAL & MGMT  07/22/2021   Patient Active Problem List   Diagnosis Date Noted   Aneurysm, cerebral, nonruptured 08/13/2021   Status post stroke due to cerebrovascular disease    Cerebral edema ([place]) 04/29/2021   Stroke (cerebrum) ([place]) 04/29/2021   Right sided weakness    Acute CVA (cerebrovascular accident) ([place]) 04/27/2021   Primary osteoarthritis of left knee 01/13/2019   Primary osteoarthritis of right knee 01/13/2019   Flat foot 11/28/2017   Tendonitis of foot 11/28/2017   Polycythemia 05/12/2017   Vitamin D deficiency 04/21/2017   Bony sclerosis 08/05/2016   Lung nodule    Chronic pain of both knees 12/05/2015   Gastroesophageal reflux disease without esophagitis 05/17/2014   Essential hypertension 05/17/2014   Coronary artery calcification 08/17/2012   Abnormal LFTs (liver function tests) 07/11/2012   Chronic abdominal pain 06/24/2012   Right-sided chest wall pain 06/24/2012   Constipation, chronic 06/24/2012   Hyperbilirubinemia 06/24/2012   Chronic back pain  06/24/2012   DDD (degenerative disc disease), thoracolumbar 06/24/2012   History of nephrolithiasis 06/24/2012   Side pain 06/24/2012   Low back pain at multiple sites 06/24/2012    ONSET DATE: 07/17/21 (date of OT order)  REFERRING DIAG: M25.531 (ICD-10-CM) - Pain in right wrist M25.511 (ICD-10-CM) - Acute pain of right shoulder   THERAPY DIAG:  Hemiplegia and hemiparesis following cerebral infarction affecting right dominant side ([facility])  Acute pain of right shoulder  Pain in right wrist  Muscle weakness (generalized)  Other symptoms and signs involving the nervous system  Rationale for Evaluation and Treatment Rehabilitation  SUBJECTIVE:   SUBJECTIVE STATEMENT: Pt reports he was rushing this morning because he was late to therapy and fell outside on the step down from his front door; denies any injury or hitting his head Pt accompanied by: self and family member (brother, Selinda Michaels)  PAIN: Are you having pain? No; reports 5-6/10 pain w/ L shoulder movement  PERTINENT HISTORY: L frontoparietal CVA w/ residual R-sided weakness, presented to ED 04/27/21 after recent prolonged travel; PMH includes h/o polycythemia, HTN  PRECAUTIONS: Fall  PLOF: Independent and Vocation/Vocational requirements: retired Architect  PATIENT GOALS "Work on my shoulder"   OBJECTIVE:   UPPER EXTREMITY ROM: LUE WFL; RUE measurements below  Active ROM Right Eval - 6/26  Shoulder flexion Unable  Shoulder abduction Unable  Shoulder adduction [facility]  Shoulder  extension 24  Shoulder internal rotation [facility]  Shoulder external rotation Unable  Elbow flexion WFL  Elbow extension 33  Wrist flexion 27  Wrist extension 28  Wrist pronation To be assessed  Wrist supination To be assessed  (Blank rows = not tested)  UPPER EXTREMITY MMT:     MMT Right Eval - 6/26  Shoulder flexion 1/5  Shoulder abduction 1/5  Shoulder extension 3-/5  Shoulder internal rotation 2/5  Shoulder external rotation 2-/5   Elbow flexion 3/5  Elbow extension 2-/5  Wrist flexion 2-/5  Wrist extension 2-/5  Wrist pronation 2+/5  Wrist supination 2+/5  (Blank rows = not tested)   TODAY'S TREATMENT - 08/25/21:  Self-Feeding Holding empty standard drinking cup in R hand and practicing bringing cup w/ straw to mouth and then returning back to tabletop. Completed 6x slowly; OT provided min facilitation and verbal cues for increased activation  UE Ranger R shoulder flex/ext completed w/ UE Ranger for NMR of GMC w/ 3 lb wrist weight for incr somatosensory input; OT provided occasional mod tactile facilitation for increased control and decreased compensation w/ trunk. Completed 2nd set w/ OT applying vibration to anterior deltoid during shoulder flex and triceps toward end range for incr elbow ext each rep w/ good results.  Shoulder/Scapula AROM Exercises Closed-chain R shoulder flex holding low handle of hemi walker, focusing on anterior deltoid activation w/ elbow ext against slight resistance  AROM of bilateral shoulder shrugs 2x10 slowly in front of mirror for scapular stability and equal activation; increased success w/ mirror as visual aid vs verbal cues/modeling only    PATIENT EDUCATION: Ongoing condition-specific education; emphasizing importance of using RUE as much as possible and avoiding compensation w/ LUE Person educated: Patient Education method: Explanation Education comprehension: verbalized understanding   HOME EXERCISE PROGRAM: MedBridge Code: [id number] - Seated Shoulder Shrugs  - 3 x daily - 7 x weekly - 1 sets - 15 reps - Seated Shoulder Shrug Circles AROM Backward  - 3 x daily - 7 x weekly - 1 sets - 15 reps - Seated Scapular Retraction  - 3 x daily - 7 x weekly - 1 sets - 15 reps - Seated Shoulder External Rotation AAROM with Dowel  - 3 x daily - 7 x weekly - 1 sets - 10 reps - Supine Shoulder Flexion AAROM with Hands Clasped  - 3 x daily - 7 x weekly - 1 sets - 10 reps - Seated Elbow  Flexion and Extension AROM  - 3 x daily - 7 x weekly - 2 sets - 10 reps - Seated Forearm Pronation and Supination AROM  - 3 x daily - 7 x weekly - 2 sets - 10 reps - Wrist AROM Flexion Extension  - 3 x daily - 7 x weekly - 2 sets - 10 reps   GOALS: Goals reviewed with patient? Yes  SHORT TERM GOALS: Target date: 09/01/21  STG  Status:  1 Pt will demonstrate understanding of initial HEP designed for RUE ROM Baseline: No HEP at this time Progressing  2 Pt to improve R shoulder flexion strength to at least 2-/5 (partial ROM, gravity min) Baseline: shoulder flex MMT: 1/5 Progressing  3 Pt will be able to grasp and lift lightweight object to mouth w/ R, dominant UE in at least 2 trials Baseline: Significantly limited gross grasp Progressing  4 Pt will achieve at least 50% of R wrist extension AROM against gravity Baseline: partial ROM in horizontal plane Progressing  LONG TERM GOALS: Target date: 10/03/21  LTG  Status:  1 Pt will improve FOTO score to at least 45 to indicate improved participation in functional activities Baseline: Intake - 4 Progressing  2 Pt will be able to achieve at least partial AROM of R shoulder flexion against gravity by discharge for improved functional use of RUE Baseline: shoulder flex MMT: 1/5 Progressing  3 Pt will be able to complete at least 10 blocks during Box and Blocks Test to indicated improved unilateral GMC of R, dominant UE Baseline: Unable at time of evaluation Progressing  4 Pt will be able to demonstrate incorporation of RUE as gross assist in at least 1 functional task (pulling up pants, stabilizing plate, etc.) Baseline: Not incorporating LUE at gross level at time of eval Progressing     ASSESSMENT:  CLINICAL IMPRESSION: Pt continues to make slow progress toward goals w/ OT continuing to focus on NMR of R shoulder and elbow for improved functional use of RUE. OT also incorporated application of vibration during exercises for increased  muscle activation, muscle fiber recruitment, and somatosensory input w/ positive results. Also, due to R shoulder subluxation, OT discussed benefit of attempting KT taping, but pt due to pt's report of having a reaction to taping in the hospital, OT d/c attempt and instead focused on shoulder stability exercises. Pt continues to benefit from significant encouragement to use RUE as much as possible (safely), particularly at home to facilitate neuro reed and motor learning due to learned nonuse and considering pt has good and improving GMC when challenged.  PERFORMANCE DEFICITS in functional skills including ADLs, IADLs, coordination, dexterity, proprioception, sensation, tone, ROM, strength, pain, FMC, GMC, mobility, balance, body mechanics, decreased knowledge of use of DME, and UE functional use, cognitive skills including safety awareness, and psychosocial skills including environmental adaptation.   IMPAIRMENTS are limiting patient from ADLs, IADLs, rest and sleep, and work.   COMORBIDITIES may have co-morbidities  that affects occupational performance. Patient will benefit from skilled OT to address above impairments and improve overall function.   PLAN: OT FREQUENCY: 3x/week  OT DURATION: 8 weeks  PLANNED INTERVENTIONS: self care/ADL training, therapeutic exercise, therapeutic activity, neuromuscular re-education, manual therapy, passive range of motion, balance training, functional mobility training, aquatic therapy, splinting, electrical stimulation, ultrasound, iontophoresis, moist heat, cryotherapy, patient/family education, and DME and/or AE instructions  RECOMMENDED OTHER SERVICES: Currently receiving PT services at this location  CONSULTED AND AGREED WITH PLAN OF CARE: Patient and family member/caregiver  PLAN FOR NEXT SESSION: Scapular stability (e.g., supine, retraction/depression, rows); continue to address NMR of RUE [place] and functional grasp and release   Kathrine Cords, MSOT,  OTR/L 08/25/2021, 1:27 PM

## 2021-08-26 ENCOUNTER — Ambulatory Visit: Payer: Medicare Other

## 2021-08-26 ENCOUNTER — Ambulatory Visit: Payer: Medicare Other | Admitting: Occupational Therapy

## 2021-08-26 ENCOUNTER — Encounter: Payer: Self-pay | Admitting: Occupational Therapy

## 2021-08-26 DIAGNOSIS — I69351 Hemiplegia and hemiparesis following cerebral infarction affecting right dominant side: Secondary | ICD-10-CM

## 2021-08-26 DIAGNOSIS — R29818 Other symptoms and signs involving the nervous system: Secondary | ICD-10-CM

## 2021-08-26 DIAGNOSIS — M25531 Pain in right wrist: Secondary | ICD-10-CM

## 2021-08-26 DIAGNOSIS — R2689 Other abnormalities of gait and mobility: Secondary | ICD-10-CM

## 2021-08-26 DIAGNOSIS — M6281 Muscle weakness (generalized): Secondary | ICD-10-CM

## 2021-08-26 DIAGNOSIS — M545 Low back pain, unspecified: Secondary | ICD-10-CM | POA: Diagnosis not present

## 2021-08-26 DIAGNOSIS — R293 Abnormal posture: Secondary | ICD-10-CM | POA: Diagnosis not present

## 2021-08-26 DIAGNOSIS — M25511 Pain in right shoulder: Secondary | ICD-10-CM | POA: Diagnosis not present

## 2021-08-26 DIAGNOSIS — Z8673 Personal history of transient ischemic attack (TIA), and cerebral infarction without residual deficits: Secondary | ICD-10-CM

## 2021-08-26 DIAGNOSIS — G8929 Other chronic pain: Secondary | ICD-10-CM | POA: Diagnosis not present

## 2021-08-26 DIAGNOSIS — I63412 Cerebral infarction due to embolism of left middle cerebral artery: Secondary | ICD-10-CM

## 2021-08-26 NOTE — Therapy (Signed)
OUTPATIENT PHYSICAL THERAPY TREATMENT   Patient Name: Aaron Dennis MRN: [id number] DOB:[birth year], 66 y.o., male Today's Date: 08/26/2021   PT End of Session - 08/26/21 1014     Visit Number 9    Date for PT Re-Evaluation 10/06/21    PT Start Time 1015    PT Stop Time 1100    PT Time Calculation (min) 45 min    Equipment Utilized During Treatment Gait belt    Activity Tolerance Patient tolerated treatment well;Patient limited by pain    Behavior During Therapy WFL for tasks assessed/performed                   Past Medical History:  Diagnosis Date   GERD (gastroesophageal reflux disease)    Hypertension    Lung nodule    7 mm LUL nodule   Past Surgical History:  Procedure Laterality Date   IR RADIOLOGIST EVAL & MGMT  07/02/2021   IR RADIOLOGIST EVAL & MGMT  07/22/2021   Patient Active Problem List   Diagnosis Date Noted   Aneurysm, cerebral, nonruptured 08/13/2021   Status post stroke due to cerebrovascular disease    Cerebral edema ([place]) 04/29/2021   Stroke (cerebrum) ([place]) 04/29/2021   Right sided weakness    Acute CVA (cerebrovascular accident) ([place]) 04/27/2021   Primary osteoarthritis of left knee 01/13/2019   Primary osteoarthritis of right knee 01/13/2019   Flat foot 11/28/2017   Tendonitis of foot 11/28/2017   Polycythemia 05/12/2017   Vitamin D deficiency 04/21/2017   Bony sclerosis 08/05/2016   Lung nodule    Chronic pain of both knees 12/05/2015   Gastroesophageal reflux disease without esophagitis 05/17/2014   Essential hypertension 05/17/2014   Coronary artery calcification 08/17/2012   Abnormal LFTs (liver function tests) 07/11/2012   Chronic abdominal pain 06/24/2012   Right-sided chest wall pain 06/24/2012   Constipation, chronic 06/24/2012   Hyperbilirubinemia 06/24/2012   Chronic back pain 06/24/2012   DDD (degenerative disc disease), thoracolumbar 06/24/2012   History of nephrolithiasis 06/24/2012   Side pain 06/24/2012   Low  back pain at multiple sites 06/24/2012    PCP: [name]  REFERRING PROVIDER: [name]  REFERRING DIAG: M25.511  THERAPY DIAG:  Hemiplegia and hemiparesis following cerebral infarction affecting right dominant side (HCC)  Muscle weakness (generalized)  Other symptoms and signs involving the nervous system  Status post stroke due to cerebrovascular disease  Other abnormalities of gait and mobility  Cerebrovascular accident (CVA) due to embolism of left middle cerebral artery (HCC)  Abnormal posture  Rationale for Evaluation and Treatment Rehabilitation  ONSET DATE: 07/15/21  SUBJECTIVE:  SUBJECTIVE STATEMENT: Patient states he is feeling okay, had  a lot of pain in his shoulder at night.   PERTINENT HISTORY: Acute CVA 3/19  PAIN:  Are you having pain? No  PRECAUTIONS: Fall  WEIGHT BEARING RESTRICTIONS No  FALLS:  Has patient fallen in last 6 months? Yes. Number of falls 4  LIVING ENVIRONMENT: Lives with: lives with their family Lives in: House/apartment Stairs: Yes: External: 2 steps; none Has following equipment at home: Single point cane, Hemi walker, Wheelchair (manual), and Grab bars  OCCUPATION: retired  PLOF: [place] try to get stronger, try to use my legs and feet   OBJECTIVE:   DIAGNOSTIC FINDINGS:  Right shoulder 3 views: Shoulder is well located.  Subacromial space well-maintained.  Type II downsloping acromion.  No acute fractures or acute findings Right wrist 3 views: No acute fractures.  No subluxation dislocation of  the carpal bones.  Wrist joints well-maintained.  No bony abnormalities.  PATIENT SURVEYS:  FOTO 4  COGNITION:  Overall cognitive status: Within functional limits for tasks  assessed     SENSATION: WFL  POSTURE: Decreased R shoulder height, hemiparesis, forward head, flexed trunk, R lateral lean  UPPER EXTREMITY ROM:   Passive ROM Right eval Left eval  Shoulder flexion 60d w/pain   Shoulder extension    Shoulder abduction 80d w/pain    Shoulder adduction    Shoulder internal rotation Full w/end range pain   Shoulder external rotation 40d w/pain   Elbow flexion full   Elbow extension full   Wrist flexion    Wrist extension    Wrist ulnar deviation    Wrist radial deviation    Wrist pronation    Wrist supination    (Blank rows = not tested)  UPPER EXTREMITY MMT: L WFL  MMT Right eval Left eval  Shoulder flexion 1   Shoulder extension    Shoulder abduction    Shoulder adduction 2-   Shoulder internal rotation 2-   Shoulder external rotation 2-   Middle trapezius    Lower trapezius    Elbow flexion 2+   Elbow extension 2-   Wrist flexion    Wrist extension    Wrist ulnar deviation    Wrist radial deviation    Wrist pronation    Wrist supination    Grip strength (lbs)    (Blank rows = not tested)  LE MMT 3/5 hip flexion, knee ext, knee flexion  Using hemiwalker, decrease R foot clearance, R foot ER, decrease hip flexion, decreased stance time on R LE, decreased step length, foot drop  SHOULDER SPECIAL TESTS:  Impingement tests: Neer impingement test: positive , Hawkins/Kennedy impingement test: positive , and Painful arc test: positive     Instability tests: Apprehension test: positive  and Sulcus sign: positive   JOINT MOBILITY TESTING:  Pain with PROM, muscle guarding, instability in R shoulder, scapular winging  PALPATION:  No TTP   FUNCTIONAL MEASURES Eval BERG 24/56 TUG 57.28s--slowed gait using hemiwalker, no foot clearance and decreased safety awareness esp with turns 5xSTS-- 25.72s, LUE push off, decreased control  w/descending   TODAY'S TREATMENT:  08/26/21 Nustep L5 x35mns Walking w/o AD 2 laps  STS from  elevated mat table 2x10 Fitter 2x10  Bridges 2x10 SLR 2x10 RLE    08/25/21 Nustep L6 x551ms HS curls 20# 2x10, RLE only #10 x10 Knee ext 10# x10, eccentric x10, 5# RLE only x10  Step ups 4" Walking w/o AD in // bars  forwards, backwards, side steps    08/21/21 Patient reports L lateral knee pain. His ITB is very tight. Therapist performed STM to ITB followed by stretch in supine with knee to opposite chest. Educated him to use tennis ball for further STM at home. Patient noted to overuse L side with all mobility, demosntrates decreased WB through RLE at all times, probably due to sensory changes and weakness with patient not trusting RLE. Educated him to the need to incorporate RLE into all mobility. Therapist first facilitated lower trunk activation and mobilization sitting on physiodisc-performed ant/post, Lat, and then circular lower trunk motions, required min-mod TC for correct technique. Moved sit to stand, educating patient with VC and tactile facilitation to remain centered with equal WB through both legs. Facilitated RLE activation. Performed repeated sit to stand, required min-mod facilit  08/19/21 NM re-ed for R pelvis and shoulder girdle-Sidelie, R pelvic mobility-ant/post, sup/inf, and into diagonals, min TC and VC for correct techqniue and isolated movement. He then mobilized R scapula in same planes, with more limited movement, moved to active shoulder protraction and retraction and active hip abd with forward and back swing to engage motor stability. Moved to sit and continued to perform reaching activities to engage R scapula and shoulder.He then moved to steps and performed step ups x 10 with RLE on 4" step, min A to facilitate correct weight shift and active RLE control.  Ambulated without AD, CGA, VC to increase strep length, no unsteadiness noted.  08/18/21 NuStep L3 x 6 min S2S x10, initially using LUE then progressing to none then 2x5 with lower elevation LAQ RLE 3#  2x10 Hamstring curls 20# 2x10, RLE 10# 2x10 Ambulate one lap with hemiwalker Toe taps on 6" box 1x10 using hemiwalker  08/14/21 NuStep L3 x5 min LE only S2S 3x5 LUE assist elevated mat Alt box taps 6in then 8in w/ hemi walker x10 each  Steps 4 in 9 steps alt pattern one rails L  Attempted 6 inch steps RLE did give out on him when ascending requiring Max assist to regain balance. Hamstring curls 20lb 2x10, RLE 10lb x10 Leg Ext 10lb x10, 5lb x10  LAQ RLE 3lb 2x10    08/07/21 Amb with shopping cart 1 x 75'. Patient demonstrated step through gait, RLE with increased flexor moment at hip and knee in stance, but stable. Nu-Step 3 min at L4, then 3 x 30 sec at L7, legs only, focused push. Heel raises with BUE support on shopping cart. Step ups on 4" step, BUE support LLE step from floor to third step an dback, x 10 STM to ITB on R due to report of R knee pain. F/B stretch to ITB. L sidelie  with R hip abd, moving R hip forward and back while holding the abd. Supine U bridge on R. BERG TUG  5xSTS  Nustep L2, x12mn  Hamstring curls redTB 2x10  LAQ 2x10 Heel taps 4" 2HHA support by stairs  PATIENT EDUCATION: Education details: POC Person educated: Patient and brother Education method: ECustomer service managerEducation comprehension: verbalized understanding   HOME EXERCISE PROGRAM: TBD   ASSESSMENT:  CLINICAL IMPRESSION:   Patient continues to presents with significant gait deviations and weakness in RLE. We focused on LE strengthening and gait training. He ordered new shoes for his AFO but they have not come in yet. STS require cueing due to leaning to L and using non-affected side. He has lots of tightness in bilateral LE, especially HS on the L side. Gait  training without hemiwalker requires verbal cues and minA to take equal steps with both sides. Able to demonstrate faster gait and better step length on the second trial.   OBJECTIVE IMPAIRMENTS Abnormal gait, decreased  mobility, difficulty walking, decreased ROM, decreased strength, decreased safety awareness, and pain.   ACTIVITY LIMITATIONS carrying, lifting, transfers, bathing, toileting, dressing, reach over head, and locomotion level  PARTICIPATION LIMITATIONS: cleaning, laundry, driving, community activity, and yard work  PERSONAL FACTORS Age, Time since onset of injury/illness/exacerbation, and 3+ comorbidities: chronic pain, acute CVA, stroke, OA  are also affecting patient's functional outcome.   REHAB POTENTIAL: Fair due to time since stroke and increased deficits on R side  CLINICAL DECISION MAKING: Evolving/moderate complexity  EVALUATION COMPLEXITY: Moderate   GOALS: Goals reviewed with patient? No  SHORT TERM GOALS: Target date: 09/08/21 Patient will be independent with initial HEP.  Goal status: ongoing  2.  Patient will be educated on strategies to decrease risk of falls.  Goal status: ongoing   LONG TERM GOALS: Target date: 10/06/21  1.  Patient will report 75% improvement in R shoulder pain to improve QOL.  Goal status: INITIAL  2.  Patient to demonstrate improved upright posture with posterior shoulder girdle engaged to promote improved glenohumeral joint mobility. Baseline: decreased shoulder height on R side, decreased shoulder stability, scapular winging  Goal status: ongoing  3.  Patient to improve R shoulder AROM to  within patient's functional norm  without pain provocation to allow for increased ease of ADLs.  Baseline: measured w/PROM Goal status: INITIAL  4.  Patient will demonstrate improved functional UE and LE strength as demonstrated by >= 4/5 in all muscle groups . Goal status: INITIAL  5.  Patient will report 33 on FOTO(patient outcome measure)  to demonstrate improved functional ability.  Baseline: 4 Goal status: INITIAL  6. Patient will score 34 or greater on Berg Balance test to demonstrate lower risk of falls. (MCID= 8 points) .  Baseline: 24 Goal  status: INITIAL  7. Patient will demonstrate decreased fall risk by scoring < 25 sec on TUG and <20 on 5xSTS. Baseline: 57.28s, 25.72s Goal status: INITIAL     PLAN: PT FREQUENCY: 3x/week  PT DURATION: 8 weeks  PLANNED INTERVENTIONS: Therapeutic exercises, Therapeutic activity, Neuromuscular re-education, Balance training, Gait training, Patient/Family education, Joint mobilization, Stair training, Orthotic/Fit training, and Manual therapy  PLAN FOR NEXT SESSION: NM re-ed, lateral step ups 4", calf raises   Andris Baumann, DPT 08/26/21 11:00 AM  08/26/2021, 11:00 AM

## 2021-08-26 NOTE — Therapy (Unsigned)
OUTPATIENT OCCUPATIONAL THERAPY TREATMENT NOTE  Patient Name: Aaron Dennis MRN: [id number] DOB:[birth year], 66 y.o., male Today's Date: 08/26/2021  PCP: Charlott Rakes, MD REFERRING PROVIDER: Pete Pelt, PA-C   OT End of Session - 08/26/21 1102     Visit Number 9    Number of Visits 25    Date for OT Re-Evaluation 10/03/21    Authorization Type UHC Medicare; Medicaid [place] (secondary)    Authorization Time Period VL: [place]    Progress Note Due on Visit 10    OT Start Time 1059    OT Stop Time 1143    OT Time Calculation (min) 44 min    Activity Tolerance Patient tolerated treatment well    Behavior During Therapy WFL for tasks assessed/performed            Past Medical History:  Diagnosis Date   GERD (gastroesophageal reflux disease)    Hypertension    Lung nodule    7 mm LUL nodule   Past Surgical History:  Procedure Laterality Date   IR RADIOLOGIST EVAL & MGMT  07/02/2021   IR RADIOLOGIST EVAL & MGMT  07/22/2021   Patient Active Problem List   Diagnosis Date Noted   Aneurysm, cerebral, nonruptured 08/13/2021   Status post stroke due to cerebrovascular disease    Cerebral edema ([place]) 04/29/2021   Stroke (cerebrum) ([place]) 04/29/2021   Right sided weakness    Acute CVA (cerebrovascular accident) ([place]) 04/27/2021   Primary osteoarthritis of left knee 01/13/2019   Primary osteoarthritis of right knee 01/13/2019   Flat foot 11/28/2017   Tendonitis of foot 11/28/2017   Polycythemia 05/12/2017   Vitamin D deficiency 04/21/2017   Bony sclerosis 08/05/2016   Lung nodule    Chronic pain of both knees 12/05/2015   Gastroesophageal reflux disease without esophagitis 05/17/2014   Essential hypertension 05/17/2014   Coronary artery calcification 08/17/2012   Abnormal LFTs (liver function tests) 07/11/2012   Chronic abdominal pain 06/24/2012   Right-sided chest wall pain 06/24/2012   Constipation, chronic 06/24/2012   Hyperbilirubinemia 06/24/2012   Chronic back pain  06/24/2012   DDD (degenerative disc disease), thoracolumbar 06/24/2012   History of nephrolithiasis 06/24/2012   Side pain 06/24/2012   Low back pain at multiple sites 06/24/2012    ONSET DATE: 07/17/21 (date of OT order)  REFERRING DIAG: M25.531 (ICD-10-CM) - Pain in right wrist M25.511 (ICD-10-CM) - Acute pain of right shoulder   THERAPY DIAG:  Hemiplegia and hemiparesis following cerebral infarction affecting right dominant side (HCC)  Acute pain of right shoulder  Pain in right wrist  Muscle weakness (generalized)  Other symptoms and signs involving the nervous system  Rationale for Evaluation and Treatment Rehabilitation  SUBJECTIVE:   SUBJECTIVE STATEMENT: Pt reports his L shoulder was really hurting last night, but he put some "cream" on it and it is not bothering him this morning Pt accompanied by: self and family member (brother, Selinda Michaels)  PAIN: Are you having pain? No; continues to report 5-6/10 pain w/ L shoulder movement  PERTINENT HISTORY: L frontoparietal CVA w/ residual R-sided weakness, presented to ED 04/27/21 after recent prolonged travel; PMH includes h/o polycythemia, HTN  PRECAUTIONS: Fall  PLOF: Independent and Vocation/Vocational requirements: retired Architect  PATIENT GOALS "Work on my shoulder"   OBJECTIVE:   UPPER EXTREMITY ROM: LUE WFL; RUE measurements below  Active ROM Right Eval - 6/26  Shoulder flexion Unable  Shoulder abduction Unable  Shoulder adduction WFL  Shoulder extension 24  Shoulder  internal rotation [facility]  Shoulder external rotation Unable  Elbow flexion WFL  Elbow extension 33  Wrist flexion 27  Wrist extension 28  Wrist pronation To be assessed  Wrist supination To be assessed  (Blank rows = not tested)  UPPER EXTREMITY MMT:     MMT Right Eval - 6/26  Shoulder flexion 1/5  Shoulder abduction 1/5  Shoulder extension 3-/5  Shoulder internal rotation 2/5  Shoulder external rotation 2-/5  Elbow flexion 3/5   Elbow extension 2-/5  Wrist flexion 2-/5  Wrist extension 2-/5  Wrist pronation 2+/5  Wrist supination 2+/5  (Blank rows = not tested)   TODAY'S TREATMENT - 08/26/21:  Rows RUE single arm rows x10 w/ light resistance (yellow theraband) and hemi hand brace on to assist w/ maintaining hold on looped band  Bilateral UE row x15 against light resistance (yellow theraband) and hemi hand brace on; OT provided mod verbal cues for equal activation throughout  Chest press Closed-chain AAROM forward chest press 2x10 w/ unweighted dowel and pt in supine to facilitate scapular alignment w/ retraction and depression. OT provided localized vibration to anterior deltoid for shoulder flexion and to triceps for elbow extension at end-range.  PROM OT performed gentle PROM of L shoulder ER 10x, incorporating passive stretch held at end range to increase ROM and decrease stiffness. Able to achieve approx 25% of ROM. Completed within tolerable level of discomfort, not pushing past point of pain. PROM limited by discomfort and spasticity.   Place-and-Hold Place-and-hold exercises for elbow flexion and then wrist extension (completed after gentle PROM) x10 each for NMR of GMC. Pt able to achieve good control w/ min difficulty; OT provided verbal facilitation prn  Wrist extension R wrist extension 2x20 against light resistance (1 lb dumbbell) for NMR of GMC; OT provided visual/tactile cue for consistency w/ arc of motion    PATIENT EDUCATION: Ongoing condition-specific education; emphasizing importance of using RUE as much as possible and avoiding compensation w/ LUE Person educated: Patient Education method: Explanation Education comprehension: verbalized understanding   HOME EXERCISE PROGRAM: MedBridge Code: [id number] - Seated Shoulder Shrugs  - 3 x daily - 7 x weekly - 1 sets - 15 reps - Seated Shoulder Shrug Circles AROM Backward  - 3 x daily - 7 x weekly - 1 sets - 15 reps - Seated Scapular Retraction  -  3 x daily - 7 x weekly - 1 sets - 15 reps - Seated Shoulder External Rotation AAROM with Dowel  - 3 x daily - 7 x weekly - 1 sets - 10 reps - Supine Shoulder Flexion AAROM with Hands Clasped  - 3 x daily - 7 x weekly - 1 sets - 10 reps - Seated Elbow Flexion and Extension AROM  - 3 x daily - 7 x weekly - 2 sets - 10 reps - Seated Forearm Pronation and Supination AROM  - 3 x daily - 7 x weekly - 2 sets - 10 reps - Wrist AROM Flexion Extension  - 3 x daily - 7 x weekly - 2 sets - 10 reps   GOALS: Goals reviewed with patient? Yes  SHORT TERM GOALS: Target date: 09/01/21  STG  Status:  1 Pt will demonstrate understanding of initial HEP designed for RUE ROM Baseline: No HEP at this time Progressing  2 Pt to improve R shoulder flexion strength to at least 2-/5 (partial ROM, gravity min) Baseline: shoulder flex MMT: 1/5 Progressing  3 Pt will be able to grasp and lift  lightweight object to mouth w/ R, dominant UE in at least 2 trials Baseline: Significantly limited gross grasp Progressing  4 Pt will achieve at least 50% of R wrist extension AROM against gravity Baseline: partial ROM in horizontal plane Progressing     LONG TERM GOALS: Target date: 10/03/21  LTG  Status:  1 Pt will improve FOTO score to at least 45 to indicate improved participation in functional activities Baseline: Intake - 4 Progressing  2 Pt will be able to achieve at least partial AROM of R shoulder flexion against gravity by discharge for improved functional use of RUE Baseline: shoulder flex MMT: 1/5 Progressing  3 Pt will be able to complete at least 10 blocks during Box and Blocks Test to indicated improved unilateral GMC of R, dominant UE Baseline: Unable at time of evaluation Progressing  4 Pt will be able to demonstrate incorporation of RUE as gross assist in at least 1 functional task (pulling up pants, stabilizing plate, etc.) Baseline: Not incorporating LUE at gross level at time of eval Progressing      ASSESSMENT:  CLINICAL IMPRESSION: Pt continues to make slow progress toward goals, benefiting from ongoing encouragement and education regarding neuro reed and motor learning. OT focused on scapular stability due to continued scapular wining on R side, as well as [place]. Most exercises completed in supine for facilitation of scapular retraction and depression for hopeful improvement in distal mobility. For the first time in a couple weeks, pt was able to participate in exercises targeting shoulder activation w/out significant pain. This is promising and pt will hopefully be able to build on this in upcoming sessions. Pt also continues to benefit from ROM exercises in gravity-minimized positions.  PERFORMANCE DEFICITS in functional skills including ADLs, IADLs, coordination, dexterity, proprioception, sensation, tone, ROM, strength, pain, FMC, GMC, mobility, balance, body mechanics, decreased knowledge of use of DME, and UE functional use, cognitive skills including safety awareness, and psychosocial skills including environmental adaptation.   IMPAIRMENTS are limiting patient from ADLs, IADLs, rest and sleep, and work.   COMORBIDITIES may have co-morbidities  that affects occupational performance. Patient will benefit from skilled OT to address above impairments and improve overall function.   PLAN: OT FREQUENCY: 3x/week  OT DURATION: 8 weeks  PLANNED INTERVENTIONS: self care/ADL training, therapeutic exercise, therapeutic activity, neuromuscular re-education, manual therapy, passive range of motion, balance training, functional mobility training, aquatic therapy, splinting, electrical stimulation, ultrasound, iontophoresis, moist heat, cryotherapy, patient/family education, and DME and/or AE instructions  RECOMMENDED OTHER SERVICES: Currently receiving PT services at this location  CONSULTED AND AGREED WITH PLAN OF CARE: Patient and family member/caregiver  PLAN FOR NEXT SESSION: Complete  progress note   Kathrine Cords, MSOT, OTR/L 08/26/2021, 12:04 PM

## 2021-08-27 NOTE — Therapy (Signed)
OUTPATIENT PHYSICAL THERAPY TREATMENT   Patient Name: Aaron Dennis MRN: [id number] DOB:[birth year], 66 y.o., male Today's Date: 08/28/2021   PT End of Session - 08/28/21 1015     Visit Number 10    Date for PT Re-Evaluation 10/06/21    PT Start Time 1015    PT Stop Time 1100    PT Time Calculation (min) 45 min    Equipment Utilized During Treatment Gait belt    Activity Tolerance Patient tolerated treatment well;Patient limited by pain    Behavior During Therapy WFL for tasks assessed/performed                    Past Medical History:  Diagnosis Date   GERD (gastroesophageal reflux disease)    Hypertension    Lung nodule    7 mm LUL nodule   Past Surgical History:  Procedure Laterality Date   IR RADIOLOGIST EVAL & MGMT  07/02/2021   IR RADIOLOGIST EVAL & MGMT  07/22/2021   Patient Active Problem List   Diagnosis Date Noted   Aneurysm, cerebral, nonruptured 08/13/2021   Status post stroke due to cerebrovascular disease    Cerebral edema ([place]) 04/29/2021   Stroke (cerebrum) ([place]) 04/29/2021   Right sided weakness    Acute CVA (cerebrovascular accident) ([place]) 04/27/2021   Primary osteoarthritis of left knee 01/13/2019   Primary osteoarthritis of right knee 01/13/2019   Flat foot 11/28/2017   Tendonitis of foot 11/28/2017   Polycythemia 05/12/2017   Vitamin D deficiency 04/21/2017   Bony sclerosis 08/05/2016   Lung nodule    Chronic pain of both knees 12/05/2015   Gastroesophageal reflux disease without esophagitis 05/17/2014   Essential hypertension 05/17/2014   Coronary artery calcification 08/17/2012   Abnormal LFTs (liver function tests) 07/11/2012   Chronic abdominal pain 06/24/2012   Right-sided chest wall pain 06/24/2012   Constipation, chronic 06/24/2012   Hyperbilirubinemia 06/24/2012   Chronic back pain 06/24/2012   DDD (degenerative disc disease), thoracolumbar 06/24/2012   History of nephrolithiasis 06/24/2012   Side pain 06/24/2012   Low  back pain at multiple sites 06/24/2012    PCP: [name]  REFERRING PROVIDER: [name]  REFERRING DIAG: M25.511  THERAPY DIAG:  Hemiplegia and hemiparesis following cerebral infarction affecting right dominant side (HCC)  Muscle weakness (generalized)  Other symptoms and signs involving the nervous system  Status post stroke due to cerebrovascular disease  Other abnormalities of gait and mobility  Cerebrovascular accident (CVA) due to embolism of left middle cerebral artery (HCC)  Abnormal posture  Rationale for Evaluation and Treatment Rehabilitation  ONSET DATE: 07/15/21  SUBJECTIVE:  SUBJECTIVE STATEMENT: Patient states he is feeling okay, had  a lot of pain in his shoulder at night.   PERTINENT HISTORY: Acute CVA 3/19  PAIN:  Are you having pain? No  PRECAUTIONS: Fall  WEIGHT BEARING RESTRICTIONS No  FALLS:  Has patient fallen in last 6 months? Yes. Number of falls 4  LIVING ENVIRONMENT: Lives with: lives with their family Lives in: House/apartment Stairs: Yes: External: 2 steps; none Has following equipment at home: Single point cane, Hemi walker, Wheelchair (manual), and Grab bars  OCCUPATION: retired  PLOF: [place] try to get stronger, try to use my legs and feet   OBJECTIVE:   DIAGNOSTIC FINDINGS:  Right shoulder 3 views: Shoulder is well located.  Subacromial space well-maintained.  Type II downsloping acromion.  No acute fractures or acute findings Right wrist 3 views: No acute fractures.  No subluxation dislocation of  the carpal bones.  Wrist joints well-maintained.  No bony abnormalities.  PATIENT SURVEYS:  FOTO 4  COGNITION:  Overall cognitive status: Within functional limits for tasks  assessed     SENSATION: WFL  POSTURE: Decreased R shoulder height, hemiparesis, forward head, flexed trunk, R lateral lean  UPPER EXTREMITY ROM:   Passive ROM Right eval 08/28/21  Shoulder flexion 60d w/pain 62  W/pain  Shoulder extension    Shoulder abduction 80d w/pain  78 w/pain  Shoulder adduction    Shoulder internal rotation Full w/end range pain   Shoulder external rotation 40d w/pain 35 w/pain  Elbow flexion full   Elbow extension full   Wrist flexion    Wrist extension    Wrist ulnar deviation    Wrist radial deviation    Wrist pronation    Wrist supination    (Blank rows = not tested)  UPPER EXTREMITY MMT: [facility]  MMT Right eval 08/28/21  Shoulder flexion 1 1  Shoulder extension    Shoulder abduction    Shoulder adduction 2- 2-  Shoulder internal rotation 2- 2  Shoulder external rotation 2- 2-  Middle trapezius    Lower trapezius    Elbow flexion 2+ 2+  Elbow extension 2- 2  Wrist flexion    Wrist extension    Wrist ulnar deviation    Wrist radial deviation    Wrist pronation    Wrist supination    Grip strength (lbs)    (Blank rows = not tested)  LE MMT 3/5 hip flexion, knee ext, knee flexion  Using hemiwalker, decrease R foot clearance, R foot ER, decrease hip flexion, decreased stance time on R LE, decreased step length, foot drop  SHOULDER SPECIAL TESTS:  Impingement tests: Neer impingement test: positive , Hawkins/Kennedy impingement test: positive , and Painful arc test: positive     Instability tests: Apprehension test: positive  and Sulcus sign: positive   JOINT MOBILITY TESTING:  Pain with PROM, muscle guarding, instability in R shoulder, scapular winging  PALPATION:  No TTP   FUNCTIONAL MEASURES Eval BERG 24/56 TUG 57.28s--slowed gait using hemiwalker, no foot clearance and decreased safety awareness esp with turns 5xSTS-- 25.72s, LUE push off, decreased control w/descending   TODAY'S TREATMENT:  08/28/21 Reasses goals for  progress note TUG 48.95s 5xSTS 20.35s Nustep L5x53mns Walking w/o AD 2 laps  Calf raises 2x10  08/26/21 Nustep L5 x671ms Walking w/o AD 2 laps  STS from elevated mat table 2x10 Fitter 2x10  Bridges 2x10 SLR 2x10 RLE    08/25/21 Nustep L6 x5m10m HS curls 20# 2x10, RLE  only #10 x10 Knee ext 10# x10, eccentric x10, 5# RLE only x10  Step ups 4" Walking w/o AD in // bars forwards, backwards, side steps    08/21/21 Patient reports L lateral knee pain. His ITB is very tight. Therapist performed STM to ITB followed by stretch in supine with knee to opposite chest. Educated him to use tennis ball for further STM at home. Patient noted to overuse L side with all mobility, demosntrates decreased WB through RLE at all times, probably due to sensory changes and weakness with patient not trusting RLE. Educated him to the need to incorporate RLE into all mobility. Therapist first facilitated lower trunk activation and mobilization sitting on physiodisc-performed ant/post, Lat, and then circular lower trunk motions, required min-mod TC for correct technique. Moved sit to stand, educating patient with VC and tactile facilitation to remain centered with equal WB through both legs. Facilitated RLE activation. Performed repeated sit to stand, required min-mod facilit  08/19/21 NM re-ed for R pelvis and shoulder girdle-Sidelie, R pelvic mobility-ant/post, sup/inf, and into diagonals, min TC and VC for correct techqniue and isolated movement. He then mobilized R scapula in same planes, with more limited movement, moved to active shoulder protraction and retraction and active hip abd with forward and back swing to engage motor stability. Moved to sit and continued to perform reaching activities to engage R scapula and shoulder.He then moved to steps and performed step ups x 10 with RLE on 4" step, min A to facilitate correct weight shift and active RLE control.  Ambulated without AD, CGA, VC to increase strep  length, no unsteadiness noted.  08/18/21 NuStep L3 x 6 min S2S x10, initially using LUE then progressing to none then 2x5 with lower elevation LAQ RLE 3# 2x10 Hamstring curls 20# 2x10, RLE 10# 2x10 Ambulate one lap with hemiwalker Toe taps on 6" box 1x10 using hemiwalker  08/14/21 NuStep L3 x5 min LE only S2S 3x5 LUE assist elevated mat Alt box taps 6in then 8in w/ hemi walker x10 each  Steps 4 in 9 steps alt pattern one rails L  Attempted 6 inch steps RLE did give out on him when ascending requiring Max assist to regain balance. Hamstring curls 20lb 2x10, RLE 10lb x10 Leg Ext 10lb x10, 5lb x10  LAQ RLE 3lb 2x10    08/07/21 Amb with shopping cart 1 x 75'. Patient demonstrated step through gait, RLE with increased flexor moment at hip and knee in stance, but stable. Nu-Step 3 min at L4, then 3 x 30 sec at L7, legs only, focused push. Heel raises with BUE support on shopping cart. Step ups on 4" step, BUE support LLE step from floor to third step an dback, x 10 STM to ITB on R due to report of R knee pain. F/B stretch to ITB. L sidelie  with R hip abd, moving R hip forward and back while holding the abd. Supine U bridge on R. BERG TUG  5xSTS  Nustep L2, x45mn  Hamstring curls redTB 2x10  LAQ 2x10 Heel taps 4" 2HHA support by stairs  PATIENT EDUCATION: Education details: POC Person educated: Patient and brother Education method: ECustomer service managerEducation comprehension: verbalized understanding   HOME EXERCISE PROGRAM: TBD   ASSESSMENT:  CLINICAL IMPRESSION:   Progress note today, patient is showing slow steady improvements towards his goals. Is doing better with locomotion and gait compared to R shoulder. Continued with gait training w/o AD, requires minA and verbal cues for step length. Still  walking with ataxic gait, decreased step length, increased knee flexion, decreased foot clearance on RLE.   OBJECTIVE IMPAIRMENTS Abnormal gait, decreased mobility,  difficulty walking, decreased ROM, decreased strength, decreased safety awareness, and pain.   ACTIVITY LIMITATIONS carrying, lifting, transfers, bathing, toileting, dressing, reach over head, and locomotion level  PARTICIPATION LIMITATIONS: cleaning, laundry, driving, community activity, and yard work  PERSONAL FACTORS Age, Time since onset of injury/illness/exacerbation, and 3+ comorbidities: chronic pain, acute CVA, stroke, OA  are also affecting patient's functional outcome.   REHAB POTENTIAL: Fair due to time since stroke and increased deficits on R side  CLINICAL DECISION MAKING: Evolving/moderate complexity  EVALUATION COMPLEXITY: Moderate   GOALS: Goals reviewed with patient? No  SHORT TERM GOALS: Target date: 09/08/21 Patient will be independent with initial HEP.  Goal status: ongoing  2.  Patient will be educated on strategies to decrease risk of falls.  Goal status: ongoing   LONG TERM GOALS: Target date: 10/06/21  1.  Patient will report 75% improvement in R shoulder pain to improve QOL.  Goal status: IN PROGRESS  2.  Patient to demonstrate improved upright posture with posterior shoulder girdle engaged to promote improved glenohumeral joint mobility. Baseline: decreased shoulder height on R side, decreased shoulder stability, scapular winging  Goal status: ongoing  3.  Patient to improve R shoulder AROM to  within patient's functional norm  without pain provocation to allow for increased ease of ADLs.  Baseline: measured w/PROM, minimal AROM, PROM taken 7/20 Goal status: IN PROGRESS  4.  Patient will demonstrate improved functional UE and LE strength as demonstrated by >= 4/5 in all muscle groups . Goal status: IN PROGRESS  5.  Patient will report 39 on FOTO(patient outcome measure)  to demonstrate improved functional ability.  Baseline: 4, 28 Goal status: IN PROGRESS  6. Patient will score 34 or greater on Berg Balance test to demonstrate lower risk of falls.  (MCID= 8 points) .  Baseline: 24, 30  Goal status: ongoing  7. Patient will demonstrate decreased fall risk by scoring < 25 sec on TUG and <20 on 5xSTS. Baseline: 57.28s, 25.72s, 7/20-21.35s, 48.95s Goal status: ongoing     PLAN: PT FREQUENCY: 3x/week  PT DURATION: 8 weeks  PLANNED INTERVENTIONS: Therapeutic exercises, Therapeutic activity, Neuromuscular re-education, Balance training, Gait training, Patient/Family education, Joint mobilization, Stair training, Orthotic/Fit training, and Manual therapy  PLAN FOR NEXT SESSION: NM re-ed, Obstacle course (step up 4", ladder, step up on airex)  Andris Baumann, DPT 08/28/21 11:01 AM

## 2021-08-28 ENCOUNTER — Encounter: Payer: Self-pay | Admitting: Occupational Therapy

## 2021-08-28 ENCOUNTER — Ambulatory Visit: Payer: Medicare Other

## 2021-08-28 ENCOUNTER — Ambulatory Visit: Payer: Medicare Other | Admitting: Occupational Therapy

## 2021-08-28 DIAGNOSIS — I63412 Cerebral infarction due to embolism of left middle cerebral artery: Secondary | ICD-10-CM

## 2021-08-28 DIAGNOSIS — M25511 Pain in right shoulder: Secondary | ICD-10-CM

## 2021-08-28 DIAGNOSIS — M6281 Muscle weakness (generalized): Secondary | ICD-10-CM

## 2021-08-28 DIAGNOSIS — I69351 Hemiplegia and hemiparesis following cerebral infarction affecting right dominant side: Secondary | ICD-10-CM

## 2021-08-28 DIAGNOSIS — R29818 Other symptoms and signs involving the nervous system: Secondary | ICD-10-CM

## 2021-08-28 DIAGNOSIS — Z8673 Personal history of transient ischemic attack (TIA), and cerebral infarction without residual deficits: Secondary | ICD-10-CM

## 2021-08-28 DIAGNOSIS — R2689 Other abnormalities of gait and mobility: Secondary | ICD-10-CM | POA: Diagnosis not present

## 2021-08-28 DIAGNOSIS — M25531 Pain in right wrist: Secondary | ICD-10-CM

## 2021-08-28 DIAGNOSIS — R293 Abnormal posture: Secondary | ICD-10-CM

## 2021-08-28 DIAGNOSIS — G8929 Other chronic pain: Secondary | ICD-10-CM | POA: Diagnosis not present

## 2021-08-28 DIAGNOSIS — M545 Low back pain, unspecified: Secondary | ICD-10-CM | POA: Diagnosis not present

## 2021-08-28 NOTE — Therapy (Unsigned)
OUTPATIENT OCCUPATIONAL THERAPY TREATMENT NOTE & PROGRESS NOTE  Patient Name: Aaron Dennis MRN: [id number] DOB:Sep 19, 1955, 66 y.o., male Today's Date: 08/28/2021  PCP: Charlott Rakes, MD REFERRING PROVIDER: Pete Pelt, PA-C   OT End of Session - 08/28/21 1104     Visit Number 10    Number of Visits 25    Date for OT Re-Evaluation 10/03/21    Authorization Type UHC Medicare; Medicaid [place] (secondary)    Authorization Time Period VL: [place]    Progress Note Due on Visit 10    OT Start Time 1102    OT Stop Time 1144    OT Time Calculation (min) 42 min    Activity Tolerance Patient tolerated treatment well    Behavior During Therapy WFL for tasks assessed/performed            Occupational Therapy Progress Note  Dates of Reporting Period: 08/04/21 to 08/28/21  Objective Reports of Subjective Statement: Pt continues to report R shoulder pain averaging around 5/10 w/ movement  Objective Measurements: MMT, AROM, Box and Blocks, FOTO  Goal Update: Pt has met 2/4 STGs and is slowly progressing toward remaining 2/4 STGs and 4/4 LTGs  Plan: Continue to target NMR of RUE gross and fine motor control, coordination, proprioception, ROM, strength, mobility and balance, body mechanics, and overall improvement in RUE functional use  Reason Skilled Services are Required: Pt continues to demonstrate performance deficits in functional and cognitive skills impacting participation and safety w/ BADLs and IADLs. Pt will benefit from continued skilled OT services to address these deficits during subacute phase of recovery for overall improvement in independence and quality of life.   Past Medical History:  Diagnosis Date   GERD (gastroesophageal reflux disease)    Hypertension    Lung nodule    7 mm LUL nodule   Past Surgical History:  Procedure Laterality Date   IR RADIOLOGIST EVAL & MGMT  07/02/2021   IR RADIOLOGIST EVAL & MGMT  07/22/2021   Patient Active Problem List   Diagnosis Date  Noted   Aneurysm, cerebral, nonruptured 08/13/2021   Status post stroke due to cerebrovascular disease    Cerebral edema ([place]) 04/29/2021   Stroke (cerebrum) ([place]) 04/29/2021   Right sided weakness    Acute CVA (cerebrovascular accident) ([place]) 04/27/2021   Primary osteoarthritis of left knee 01/13/2019   Primary osteoarthritis of right knee 01/13/2019   Flat foot 11/28/2017   Tendonitis of foot 11/28/2017   Polycythemia 05/12/2017   Vitamin D deficiency 04/21/2017   Bony sclerosis 08/05/2016   Lung nodule    Chronic pain of both knees 12/05/2015   Gastroesophageal reflux disease without esophagitis 05/17/2014   Essential hypertension 05/17/2014   Coronary artery calcification 08/17/2012   Abnormal LFTs (liver function tests) 07/11/2012   Chronic abdominal pain 06/24/2012   Right-sided chest wall pain 06/24/2012   Constipation, chronic 06/24/2012   Hyperbilirubinemia 06/24/2012   Chronic back pain 06/24/2012   DDD (degenerative disc disease), thoracolumbar 06/24/2012   History of nephrolithiasis 06/24/2012   Side pain 06/24/2012   Low back pain at multiple sites 06/24/2012    ONSET DATE: 07/17/21 (date of OT order)  REFERRING DIAG: M25.531 (ICD-10-CM) - Pain in right wrist M25.511 (ICD-10-CM) - Acute pain of right shoulder   THERAPY DIAG:  Hemiplegia and hemiparesis following cerebral infarction affecting right dominant side (HCC)  Acute pain of right shoulder  Pain in right wrist  Muscle weakness (generalized)  Other symptoms and signs involving the nervous system  Rationale for Evaluation and Treatment Rehabilitation  SUBJECTIVE:   SUBJECTIVE STATEMENT: "I need to follow up with my doctor" (referring to persistent R shoulder pain) Pt accompanied by: self and family member (brother, Yassir, drove pt to clinic)  PAIN: Are you having pain? No; continues to report pain w/ L shoulder movement  PERTINENT HISTORY: L frontoparietal CVA w/ residual R-sided weakness,  presented to ED 04/27/21 after recent prolonged travel; PMH includes h/o polycythemia, HTN  PRECAUTIONS: Fall  PLOF: Independent and Vocation/Vocational requirements: retired taxi driver  PATIENT GOALS "Work on my shoulder"   OBJECTIVE:   ------------------------------------------------------------------------------------------------------------------------------------------------------  UPPER EXTREMITY ROM: LUE WFL; RUE measurements below  Active ROM Right Eval - 6/26 Right 7/20  Shoulder flexion Unable 30%  Shoulder abduction Unable 40%  Shoulder adduction WFL   Shoulder extension 24   Shoulder internal rotation WFL   Shoulder external rotation Unable Unable due to pain  Elbow flexion WFL   Elbow extension 33 90%  Wrist flexion 27 80%  Wrist extension 28   Wrist pronation To be assessed   Wrist supination To be assessed   (Blank rows = not tested)  UPPER EXTREMITY MMT:     MMT Right Eval - 6/26 Right 7/20  Shoulder flexion 1/5 2-/5  Shoulder abduction 1/5 2+/5  Shoulder extension 3-/5   Shoulder internal rotation 2/5   Shoulder external rotation 2-/5   Elbow flexion 3/5 3/5  Elbow extension 2-/5 2+/5  Wrist flexion 2-/5 2+/5  Wrist extension 2-/5 2+/5  Wrist pronation 2+/5 2+/5  Wrist supination 2+/5 2+/5  (Blank rows = not tested)  ------------------------------------------------------------------------------------------------------------------------------------------------------  TODAY'S TREATMENT - 08/28/21:  Shoulder flexion Attempted R shoulder flex on tabletop in sidelying, but was unable to tolerate any movement due to pain; exercise was d/c  RUE shoulder flex/ext completed w/ UE Ranger for NMR of GMC; OT provided occasional mod tactile facilitation for increased control and decreased compensation w/ trunk. Able to complete w/out pain.  Closed-chain AAROM of R shoulder flexion w/ elbow almost fully extended using PVC glider at approx 25 degree angle  to decrease impact of gravity for increased ROM. Able to achieve shoulder flex to almost 90 degrees w/out pain consistently w/ OT providing tactile/verbal cues to decrease shoulder hiking  Towel slides on tabletop w/ RUE for shoulder flex/elbow ext to facilitate increased ROM and stretch; OT provided min verbal and tactile facilitation/cues to decrease compensatory shoulder elevation and increase arc of motion  Self-feeding Holding water bottle (approx 4 oz) w/ straw in R hand and bringing to mouth; completed 7x successfully  PROM AROM of R wrist extension 2x10 for NMR of GMC to improve gross grasp and release; OT provided visual/tactile cues for consistency w/ arc of motion  Self-PROM of R wrist extension to include in HEP; OT provided initial demonstration/modeling and assist w/ positioning to ensure understanding  Elbow Extension AROM of R elbow extension w/ gravity assist x15 for NMR of GMC to improve functional reach; OT provided tactile facilitation at triceps for increased activation  Weight Bearing Weight shifting to R side while sitting EOM and weight bearing through RUE w/ contralateral body on arm movements; completed 2x10. Pt required mod facilitation to maintain hand position during exercise and for increased wb through R hand    PATIENT EDUCATION: Ongoing condition-specific education; emphasizing importance of using RUE as much as possible and avoiding compensation w/ LUE for motor learning and neuro reeducation Person educated: Patient Education method: Explanation Education comprehension: verbalized understanding   HOME   EXERCISE PROGRAM: MedBridge Code: [id number] - Seated Shoulder Shrugs  - 3 x daily - 7 x weekly - 1 sets - 15 reps - Seated Shoulder Shrug Circles AROM Backward  - 3 x daily - 7 x weekly - 1 sets - 15 reps - Seated Scapular Retraction  - 3 x daily - 7 x weekly - 1 sets - 15 reps - Seated Shoulder External Rotation AAROM with Dowel  - 3 x daily - 7 x weekly - 1  sets - 10 reps - Supine Shoulder Flexion AAROM with Hands Clasped  - 3 x daily - 7 x weekly - 1 sets - 10 reps - Seated Elbow Flexion and Extension AROM  - 3 x daily - 7 x weekly - 2 sets - 10 reps - Seated Forearm Pronation and Supination AROM  - 3 x daily - 7 x weekly - 2 sets - 10 reps - Wrist AROM Flexion Extension  - 3 x daily - 7 x weekly - 2 sets - 10 reps   GOALS: Goals reviewed with patient? Yes  SHORT TERM GOALS: Target date: 09/01/21  STG  Status:  1 Pt will demonstrate understanding of initial HEP designed for RUE ROM Baseline: No HEP at this time Progressing  2 Pt to improve R shoulder flexion strength to at least 2-/5 (partial ROM, gravity min) Baseline: shoulder flex MMT: 1/5 Met  3 Pt will be able to grasp and lift lightweight object to mouth w/ R, dominant UE in at least 2 trials Baseline: Significantly limited gross grasp Met  4 Pt will achieve at least 50% of R wrist extension AROM against gravity Baseline: partial ROM in horizontal plane Progressing     LONG TERM GOALS: Target date: 10/03/21  LTG  Status:  1 Pt will improve FOTO score to at least 45 to indicate improved participation in functional activities Baseline: Intake - 4 Progressing  2 Pt will be able to achieve at least partial AROM of R shoulder flexion against gravity by discharge for improved functional use of RUE Baseline: shoulder flex MMT: 1/5 Progressing  3 Pt will be able to complete at least 10 blocks during Box and Blocks Test to indicated improved unilateral GMC of R, dominant UE Baseline: Unable at time of evaluation Progressing  4 Pt will be able to demonstrate incorporation of RUE as gross assist in at least 1 functional task (pulling up pants, stabilizing plate, etc.) Baseline: Not incorporating LUE at gross level at time of eval Progressing     ASSESSMENT:  CLINICAL IMPRESSION: Pt continues to make slow progress toward goals, benefiting from ongoing encouragement and education  regarding neuro reed and motor learning. Shoulder pain was initially limiting during session, but symptoms are inconsistent as pt is able to replicate movements in more difficulty positions (e.g., unable to tolerate shoulder flex in gravity minimized positioning, but able to complete sitting upright against gravity w/out pain). Pain is likely due to spasticity w/ OT reviewing relevant education again this session, particularly  regarding importance of stretching at home and follow-up w/ physician regarding spasticity management. Pt also continues to require encouragement to attempt a variety of movements; pt demonstrates the strength required but often states "I can't" before attempting. Frequently pt is successful once encouraged to attempt the exercise or activity.   PERFORMANCE DEFICITS in functional skills including ADLs, IADLs, coordination, dexterity, proprioception, sensation, tone, ROM, strength, pain, FMC, GMC, mobility, balance, body mechanics, decreased knowledge of use of DME, and UE functional use, cognitive   skills including safety awareness, and psychosocial skills including environmental adaptation.   IMPAIRMENTS are limiting patient from ADLs, IADLs, rest and sleep, and work.   COMORBIDITIES may have co-morbidities  that affects occupational performance. Patient will benefit from skilled OT to address above impairments and improve overall function.   PLAN: OT FREQUENCY: 3x/week  OT DURATION: 8 weeks  PLANNED INTERVENTIONS: self care/ADL training, therapeutic exercise, therapeutic activity, neuromuscular re-education, manual therapy, passive range of motion, balance training, functional mobility training, aquatic therapy, splinting, electrical stimulation, ultrasound, iontophoresis, moist heat, cryotherapy, patient/family education, and DME and/or AE instructions  RECOMMENDED OTHER SERVICES: Currently receiving PT services at this location  CONSULTED AND AGREED WITH PLAN OF CARE:  Patient and family member/caregiver  PLAN FOR NEXT SESSION: Continue to target RUE GM and FMC, coordination, and strength; wb and somatosensory input for NMR   Kelsey Sneed, MSOT, OTR/L 08/28/2021, 5:36 PM 

## 2021-09-01 ENCOUNTER — Ambulatory Visit: Payer: Medicare Other

## 2021-09-01 ENCOUNTER — Ambulatory Visit: Payer: Medicare Other | Admitting: Occupational Therapy

## 2021-09-01 ENCOUNTER — Encounter: Payer: Self-pay | Admitting: Occupational Therapy

## 2021-09-01 VITALS — BP 115/88 | HR 76

## 2021-09-01 DIAGNOSIS — R29818 Other symptoms and signs involving the nervous system: Secondary | ICD-10-CM

## 2021-09-01 DIAGNOSIS — M6281 Muscle weakness (generalized): Secondary | ICD-10-CM

## 2021-09-01 DIAGNOSIS — I69351 Hemiplegia and hemiparesis following cerebral infarction affecting right dominant side: Secondary | ICD-10-CM

## 2021-09-01 DIAGNOSIS — Z8673 Personal history of transient ischemic attack (TIA), and cerebral infarction without residual deficits: Secondary | ICD-10-CM | POA: Diagnosis not present

## 2021-09-01 DIAGNOSIS — I63412 Cerebral infarction due to embolism of left middle cerebral artery: Secondary | ICD-10-CM | POA: Diagnosis not present

## 2021-09-01 DIAGNOSIS — M25511 Pain in right shoulder: Secondary | ICD-10-CM

## 2021-09-01 DIAGNOSIS — R2689 Other abnormalities of gait and mobility: Secondary | ICD-10-CM | POA: Diagnosis not present

## 2021-09-01 DIAGNOSIS — M25531 Pain in right wrist: Secondary | ICD-10-CM | POA: Diagnosis not present

## 2021-09-01 DIAGNOSIS — R293 Abnormal posture: Secondary | ICD-10-CM

## 2021-09-01 DIAGNOSIS — G8929 Other chronic pain: Secondary | ICD-10-CM | POA: Diagnosis not present

## 2021-09-01 DIAGNOSIS — M545 Low back pain, unspecified: Secondary | ICD-10-CM | POA: Diagnosis not present

## 2021-09-01 NOTE — Therapy (Unsigned)
OUTPATIENT OCCUPATIONAL THERAPY TREATMENT NOTE  Patient Name: Aaron Dennis MRN: [id number] DOB:12-Mar-1955, 66 y.o., male Today's Date: 09/01/2021  PCP: Charlott Rakes, MD REFERRING PROVIDER: Pete Pelt, PA-C   OT End of Session - 09/01/21 1105     Visit Number 11    Number of Visits 25    Date for OT Re-Evaluation 10/03/21    Authorization Type UHC Medicare; Medicaid [place] (secondary)    Authorization Time Period VL: [place]    Progress Note Due on Visit 10    OT Start Time 1100    OT Stop Time 1142    OT Time Calculation (min) 42 min    Activity Tolerance Patient tolerated treatment well    Behavior During Therapy WFL for tasks assessed/performed            Past Medical History:  Diagnosis Date   GERD (gastroesophageal reflux disease)    Hypertension    Lung nodule    7 mm LUL nodule   Past Surgical History:  Procedure Laterality Date   IR RADIOLOGIST EVAL & MGMT  07/02/2021   IR RADIOLOGIST EVAL & MGMT  07/22/2021   Patient Active Problem List   Diagnosis Date Noted   Aneurysm, cerebral, nonruptured 08/13/2021   Status post stroke due to cerebrovascular disease    Cerebral edema ([place]) 04/29/2021   Stroke (cerebrum) ([place]) 04/29/2021   Right sided weakness    Acute CVA (cerebrovascular accident) ([place]) 04/27/2021   Primary osteoarthritis of left knee 01/13/2019   Primary osteoarthritis of right knee 01/13/2019   Flat foot 11/28/2017   Tendonitis of foot 11/28/2017   Polycythemia 05/12/2017   Vitamin D deficiency 04/21/2017   Bony sclerosis 08/05/2016   Lung nodule    Chronic pain of both knees 12/05/2015   Gastroesophageal reflux disease without esophagitis 05/17/2014   Essential hypertension 05/17/2014   Coronary artery calcification 08/17/2012   Abnormal LFTs (liver function tests) 07/11/2012   Chronic abdominal pain 06/24/2012   Right-sided chest wall pain 06/24/2012   Constipation, chronic 06/24/2012   Hyperbilirubinemia 06/24/2012   Chronic back pain  06/24/2012   DDD (degenerative disc disease), thoracolumbar 06/24/2012   History of nephrolithiasis 06/24/2012   Side pain 06/24/2012   Low back pain at multiple sites 06/24/2012    ONSET DATE: 07/17/21 (date of OT order)  REFERRING DIAG: M25.531 (ICD-10-CM) - Pain in right wrist M25.511 (ICD-10-CM) - Acute pain of right shoulder   THERAPY DIAG:  Hemiplegia and hemiparesis following cerebral infarction affecting right dominant side (HCC)  Acute pain of right shoulder  Pain in right wrist  Muscle weakness (generalized)  Other symptoms and signs involving the nervous system  Rationale for Evaluation and Treatment Rehabilitation  SUBJECTIVE:   SUBJECTIVE STATEMENT: Pt reports having a follow-up appt w/ ortho MD on Wednesday; his wife also had a knee replacement on Friday and he spent a lot of time with her at the hospital over the weekend Pt accompanied by: self and family member (brother, Selinda Michaels, drove pt to clinic)  PAIN: Are you having pain? No; continues to report pain w/ L shoulder movement  PERTINENT HISTORY: L frontoparietal CVA w/ residual R-sided weakness, presented to ED 04/27/21 after recent prolonged travel; PMH includes h/o polycythemia, HTN  PRECAUTIONS: Fall  PLOF: Independent and Vocation/Vocational requirements: retired Architect  PATIENT GOALS "Work on my shoulder"   OBJECTIVE:   ------------------------------------------------------------------------------------------------------------------------------------------------------  UPPER EXTREMITY ROM: LUE WFL; RUE measurements below  Active ROM Right Eval - 6/26 Right 7/20  Shoulder flexion Unable 30%  Shoulder abduction Unable 40%  Shoulder adduction WFL   Shoulder extension 24   Shoulder internal rotation [facility]   Shoulder external rotation Unable Unable due to pain  Elbow flexion WFL   Elbow extension 33 90%  Wrist flexion 27 80%  Wrist extension 28   Wrist pronation To be assessed   Wrist  supination To be assessed   (Blank rows = not tested)  UPPER EXTREMITY MMT:     MMT Right Eval - 6/26 Right 7/20  Shoulder flexion 1/5 2-/5  Shoulder abduction 1/5 2+/5  Shoulder extension 3-/5   Shoulder internal rotation 2/5   Shoulder external rotation 2-/5   Elbow flexion 3/5 3/5  Elbow extension 2-/5 2+/5  Wrist flexion 2-/5 2+/5  Wrist extension 2-/5 2+/5  Wrist pronation 2+/5 2+/5  Wrist supination 2+/5 2+/5  (Blank rows = not tested)  ------------------------------------------------------------------------------------------------------------------------------------------------------  TODAY'S TREATMENT - 09/01/21:  [place] Using R hand to pick up 1" blocks and move from plate on L side to plate on R side; mod cues for facilitation of typical movement patterns (I.e., incorporating elbow flexion vs shoulder hiking w/ trunk rotation). Completed additional set of 10 blocks from plate on R side back to plate on L side w/ continued facilitation of alignment and positioning  Neuro Reed NMR of R shoulder flexion w/ NMES applied to anterior deltoid and biceps; pt denied all contraindications. Parameters: VMS waveform, 4/12 cycle time, 35 pps, 2 sec ramp time, 300 usec phase duration, 10.0 mA CC of amplitude/intensity; completed exercises outlined below:  Picking up 1" blocks w/ R hand and moving onto plate within forward reaching distance; significant cues required to decrease compensatory trunk flexion throughout  Sliding tennis ball forward and back on tabletop w/ RUE for shoulder flex/elbow ext to facilitate increased activation of anterior deltoid for forward reach; OT provided min verbal and tactile facilitation/cues to decrease compensatory shoulder elevation/UE abduction, and trunk flexion, and to increase arc of motion  Elbow Flexion AROM of R elbow flexion w/ 2 lb wrist weight on for light resistance; completed 2x10 for NMR of GMC to improve hand-to-mouth movement; OT provided  tactile facilitation at biceps prn for increased activation    PATIENT EDUCATION: Ongoing condition-specific education; emphasizing importance of using RUE as much as possible and avoiding compensation w/ LUE for motor learning and neuro reeducation Person educated: Patient Education method: Explanation Education comprehension: verbalized understanding   HOME EXERCISE PROGRAM: MedBridge Code: [id number] - Seated Shoulder Shrugs  - 3 x daily - 7 x weekly - 1 sets - 15 reps - Seated Shoulder Shrug Circles AROM Backward  - 3 x daily - 7 x weekly - 1 sets - 15 reps - Seated Scapular Retraction  - 3 x daily - 7 x weekly - 1 sets - 15 reps - Seated Shoulder External Rotation AAROM with [place]  - 3 x daily - 7 x weekly - 1 sets - 10 reps - Supine Shoulder Flexion AAROM with Hands Clasped  - 3 x daily - 7 x weekly - 1 sets - 10 reps - Seated Elbow Flexion and Extension AROM  - 3 x daily - 7 x weekly - 2 sets - 10 reps - Seated Forearm Pronation and Supination AROM  - 3 x daily - 7 x weekly - 2 sets - 10 reps - Wrist AROM Flexion Extension  - 3 x daily - 7 x weekly - 2 sets - 10 reps   GOALS: Goals reviewed  with patient? Yes  SHORT TERM GOALS: Target date: 09/01/21  STG  Status:  1 Pt will demonstrate understanding of initial HEP designed for RUE ROM Baseline: No HEP at this time Progressing  2 Pt to improve R shoulder flexion strength to at least 2-/5 (partial ROM, gravity min) Baseline: shoulder flex MMT: 1/5 Met - 08/25/21  3 Pt will be able to grasp and lift lightweight object to mouth w/ R, dominant UE in at least 2 trials Baseline: Significantly limited gross grasp Met - 08/25/21  4 Pt will achieve at least 50% of R wrist extension AROM against gravity Baseline: partial ROM in horizontal plane Progressing     LONG TERM GOALS: Target date: 10/03/21  LTG  Status:  1 Pt will improve FOTO score to at least 45 to indicate improved participation in functional activities Baseline: Intake - 4  Progressing  2 Pt will be able to achieve at least partial AROM of R shoulder flexion against gravity by discharge for improved functional use of RUE Baseline: shoulder flex MMT: 1/5 Progressing  3 Pt will be able to complete at least 10 blocks during Box and Blocks Test to indicated improved unilateral GMC of R, dominant UE Baseline: Unable at time of evaluation Progressing  4 Pt will be able to demonstrate incorporation of RUE as gross assist in at least 1 functional task (pulling up pants, stabilizing plate, etc.) Baseline: Not incorporating LUE at gross level at time of eval Progressing     ASSESSMENT:  CLINICAL IMPRESSION: Pt continues to make slow progress toward goals, benefiting from ongoing encouragement and education regarding neuro reed and motor learning. Session today focused on activation of anterior deltoid for shoulder flexion, elbow flexion/extension, and gross grasp and release to target reaching tasks and increased functional use of RUE. NMES incorporated for increased input to targeted muscle activation w/ good results. Pt also continues to require encouragement and condition-specific education to challenge functional use of RUE consistently.  PERFORMANCE DEFICITS in functional skills including ADLs, IADLs, coordination, dexterity, proprioception, sensation, tone, ROM, strength, pain, FMC, GMC, mobility, balance, body mechanics, decreased knowledge of use of DME, and UE functional use, cognitive skills including safety awareness, and psychosocial skills including environmental adaptation.   IMPAIRMENTS are limiting patient from ADLs, IADLs, rest and sleep, and work.   COMORBIDITIES may have co-morbidities  that affects occupational performance. Patient will benefit from skilled OT to address above impairments and improve overall function.   PLAN: OT FREQUENCY: 3x/week  OT DURATION: 8 weeks  PLANNED INTERVENTIONS: self care/ADL training, therapeutic exercise, therapeutic  activity, neuromuscular re-education, manual therapy, passive range of motion, balance training, functional mobility training, aquatic therapy, splinting, electrical stimulation, ultrasound, iontophoresis, moist heat, cryotherapy, patient/family education, and DME and/or AE instructions  RECOMMENDED OTHER SERVICES: Currently receiving PT services at this location  CONSULTED AND AGREED WITH PLAN OF CARE: Patient and family member/caregiver  PLAN FOR NEXT SESSION: Continue to target RUE GM and [place], coordination, and strength; wb and somatosensory input for [place], MSOT, OTR/L 09/01/2021, 12:06 PM

## 2021-09-01 NOTE — Therapy (Signed)
OUTPATIENT PHYSICAL THERAPY TREATMENT   Patient Name: Aaron Dennis MRN: [id number] DOB:1955/08/15, 66 y.o., male Today's Date: 09/02/2021   PT End of Session - 09/02/21 1012     Visit Number 12    Date for PT Re-Evaluation 10/06/21    PT Start Time 1015    PT Stop Time 1059    PT Time Calculation (min) 44 min    Equipment Utilized During Treatment Gait belt    Activity Tolerance Patient tolerated treatment well;Patient limited by pain    Behavior During Therapy WFL for tasks assessed/performed                      Past Medical History:  Diagnosis Date   GERD (gastroesophageal reflux disease)    Hypertension    Lung nodule    7 mm LUL nodule   Past Surgical History:  Procedure Laterality Date   IR RADIOLOGIST EVAL & MGMT  07/02/2021   IR RADIOLOGIST EVAL & MGMT  07/22/2021   Patient Active Problem List   Diagnosis Date Noted   Aneurysm, cerebral, nonruptured 08/13/2021   Status post stroke due to cerebrovascular disease    Cerebral edema ([place]) 04/29/2021   Stroke (cerebrum) ([place]) 04/29/2021   Right sided weakness    Acute CVA (cerebrovascular accident) ([place]) 04/27/2021   Primary osteoarthritis of left knee 01/13/2019   Primary osteoarthritis of right knee 01/13/2019   Flat foot 11/28/2017   Tendonitis of foot 11/28/2017   Polycythemia 05/12/2017   Vitamin D deficiency 04/21/2017   Bony sclerosis 08/05/2016   Lung nodule    Chronic pain of both knees 12/05/2015   Gastroesophageal reflux disease without esophagitis 05/17/2014   Essential hypertension 05/17/2014   Coronary artery calcification 08/17/2012   Abnormal LFTs (liver function tests) 07/11/2012   Chronic abdominal pain 06/24/2012   Right-sided chest wall pain 06/24/2012   Constipation, chronic 06/24/2012   Hyperbilirubinemia 06/24/2012   Chronic back pain 06/24/2012   DDD (degenerative disc disease), thoracolumbar 06/24/2012   History of nephrolithiasis 06/24/2012   Side pain 06/24/2012    Low back pain at multiple sites 06/24/2012    PCP: Aaron Dennis  REFERRING PROVIDER: Erskine Dennis  REFERRING DIAG: M25.511  THERAPY DIAG:  Hemiplegia and hemiparesis following cerebral infarction affecting right dominant side (HCC)  Muscle weakness (generalized)  Other symptoms and signs involving the nervous system  Status post stroke due to cerebrovascular disease  Other abnormalities of gait and mobility  Cerebrovascular accident (CVA) due to embolism of left middle cerebral artery (HCC)  Abnormal posture  Chronic bilateral low back pain without sciatica  Rationale for Evaluation and Treatment Rehabilitation  ONSET DATE: 07/15/21  SUBJECTIVE:  SUBJECTIVE STATEMENT: Doing good, not wearing shoulder brace because it was too tight yesterday.   PERTINENT HISTORY: Acute CVA 3/19  PAIN:  Are you having pain? No  PRECAUTIONS: Fall  WEIGHT BEARING RESTRICTIONS No  FALLS:  Has patient fallen in last 6 months? Yes. Number of falls 4  LIVING ENVIRONMENT: Lives with: lives with their family Lives in: House/apartment Stairs: Yes: External: 2 steps; none Has following equipment at home: Single point cane, Hemi walker, Wheelchair (manual), and Grab bars  OCCUPATION: retired  PLOF: [place] try to get stronger, try to use my legs and feet   OBJECTIVE:   DIAGNOSTIC FINDINGS:  Right shoulder 3 views: Shoulder is well located.  Subacromial space well-maintained.  Type II downsloping acromion.  No acute fractures or acute findings Right wrist 3 views: No acute fractures.  No subluxation dislocation of  the carpal bones.  Wrist joints well-maintained.  No bony abnormalities.  PATIENT SURVEYS:  FOTO 4  COGNITION:  Overall cognitive status: Within functional limits for  tasks assessed     SENSATION: WFL  POSTURE: Decreased R shoulder height, hemiparesis, forward head, flexed trunk, R lateral lean  UPPER EXTREMITY ROM:   Passive ROM Right eval 08/28/21  Shoulder flexion 60d w/pain 62  W/pain  Shoulder extension    Shoulder abduction 80d w/pain  78 w/pain  Shoulder adduction    Shoulder internal rotation Full w/end range pain   Shoulder external rotation 40d w/pain 35 w/pain  Elbow flexion full   Elbow extension full   Wrist flexion    Wrist extension    Wrist ulnar deviation    Wrist radial deviation    Wrist pronation    Wrist supination    (Blank rows = not tested)  UPPER EXTREMITY MMT: L [facility]  MMT Right eval 08/28/21  Shoulder flexion 1 1  Shoulder extension    Shoulder abduction    Shoulder adduction 2- 2-  Shoulder internal rotation 2- 2  Shoulder external rotation 2- 2-  Middle trapezius    Lower trapezius    Elbow flexion 2+ 2+  Elbow extension 2- 2  Wrist flexion    Wrist extension    Wrist ulnar deviation    Wrist radial deviation    Wrist pronation    Wrist supination    Grip strength (lbs)    (Blank rows = not tested)  LE MMT 3/5 hip flexion, knee ext, knee flexion  Using hemiwalker, decrease R foot clearance, R foot ER, decrease hip flexion, decreased stance time on R LE, decreased step length, foot drop  SHOULDER SPECIAL TESTS:  Impingement tests: Neer impingement test: positive , Hawkins/Kennedy impingement test: positive , and Painful arc test: positive     Instability tests: Apprehension test: positive  and Sulcus sign: positive   JOINT MOBILITY TESTING:  Pain with PROM, muscle guarding, instability in R shoulder, scapular winging  PALPATION:  No TTP   FUNCTIONAL MEASURES Eval BERG 24/56 TUG 57.28s--slowed gait using hemiwalker, no foot clearance and decreased safety awareness esp with turns 5xSTS-- 25.72s, LUE push off, decreased control w/descending   TODAY'S TREATMENT:  09/02/21 Nustep L5  x70mns Step ups 4" x10 on LLE x5 on RLE  Walking w/o AD, HHA for speed  Side steps over obstacles 2 way hip 2# 2x10 bilat  Calf raises 2# 2x12  09/01/21 Nustep L5 x655ms  Obstacle course (step up 4", ladder, step up on airex) Walking [facility] speed 2 laps around gym  Knee  ext 5# x10, 10# RLE eccentric  Knee flexion 20# x10, 10# x10 RLE  Leg press   08/28/21 Reasses goals for progress note TUG 48.95s 5xSTS 20.35s Nustep L5x39mns Walking w/o AD 2 laps  Calf raises 2x10  08/26/21 Nustep L5 x668ms Walking w/o AD 2 laps  STS from elevated mat table 2x10 [place]itter 2x10  Bridges 2x10 SLR 2x10 RLE    08/25/21 Nustep L6 x5m101m HS curls 20# 2x10, RLE only #10 x10 Knee ext 10# x10, eccentric x10, 5# RLE only x10  Step ups 4" Walking w/o AD in // bars forwards, backwards, side steps    08/21/21 Patient reports L lateral knee pain. His ITB is very tight. Therapist performed STM to ITB followed by stretch in supine with knee to opposite chest. Educated him to use tennis ball for further STM at home. Patient noted to overuse L side with all mobility, demosntrates decreased WB through RLE at all times, probably due to sensory changes and weakness with patient not trusting RLE. Educated him to the need to incorporate RLE into all mobility. Therapist first facilitated lower trunk activation and mobilization sitting on physiodisc-performed ant/post, Lat, and then circular lower trunk motions, required min-mod TC for correct technique. Moved sit to stand, educating patient with VC and tactile facilitation to remain centered with equal WB through both legs. Facilitated RLE activation. Performed repeated sit to stand, required min-mod facilit  08/19/21 NM re-ed for R pelvis and shoulder girdle-Sidelie, R pelvic mobility-ant/post, sup/inf, and into diagonals, min TC and VC for correct techqniue and isolated movement. He then mobilized R scapula in same planes, with more limited movement, moved to  active shoulder protraction and retraction and active hip abd with forward and back swing to engage motor stability. Moved to sit and continued to perform reaching activities to engage R scapula and shoulder.He then moved to steps and performed step ups x 10 with RLE on 4" step, min A to facilitate correct weight shift and active RLE control.  Ambulated without AD, CGA, VC to increase strep length, no unsteadiness noted.  08/18/21 NuStep L3 x 6 min S2S x10, initially using LUE then progressing to none then 2x5 with lower elevation LAQ RLE 3# 2x10 Hamstring curls 20# 2x10, RLE 10# 2x10 Ambulate one lap with hemiwalker Toe taps on 6" box 1x10 using hemiwalker  08/14/21 NuStep L3 x5 min LE only S2S 3x5 LUE assist elevated mat Alt box taps 6in then 8in w/ hemi walker x10 each  Steps 4 in 9 steps alt pattern one rails L  Attempted 6 inch steps RLE did give out on him when ascending requiring Max assist to regain balance. Hamstring curls 20lb 2x10, RLE 10lb x10 Leg Ext 10lb x10, 5lb x10  LAQ RLE 3lb 2x10    08/07/21 Amb with shopping cart 1 x 75'. Patient demonstrated step through gait, RLE with increased flexor moment at hip and knee in stance, but stable. Nu-Step 3 min at L4, then 3 x 30 sec at L7, legs only, focused push. Heel raises with BUE support on shopping cart. Step ups on 4" step, BUE support LLE step from floor to third step an dback, x 10 STM to ITB on R due to report of R knee pain. F/B stretch to ITB. L sidelie  with R hip abd, moving R hip forward and back while holding the abd. Supine U bridge on R. BERG TUG  5xSTS  Nustep L2, [id number] curls redTB 2x10  LAQ 2x10 Heel taps  4" 2HHA support by stairs  PATIENT EDUCATION: Education details: POC Person educated: Patient and brother Education method: Customer service manager Education comprehension: verbalized understanding   HOME EXERCISE PROGRAM: TBD   ASSESSMENT:  CLINICAL IMPRESSION:   Treatment  session focused on gait training, taking longer steps and increasing speed without device. Also worked on step ups and navigating obstacles with side steps. Patient is moving slow today due to being tired from having to care for his wife who underwent total knee surgery yesterday.    OBJECTIVE IMPAIRMENTS Abnormal gait, decreased mobility, difficulty walking, decreased ROM, decreased strength, decreased safety awareness, and pain.   ACTIVITY LIMITATIONS carrying, lifting, transfers, bathing, toileting, dressing, reach over head, and locomotion level  PARTICIPATION LIMITATIONS: cleaning, laundry, driving, community activity, and yard work  PERSONAL FACTORS Age, Time since onset of injury/illness/exacerbation, and 3+ comorbidities: chronic pain, acute CVA, stroke, OA  are also affecting patient's functional outcome.   REHAB POTENTIAL: Fair due to time since stroke and increased deficits on R side  CLINICAL DECISION MAKING: Evolving/moderate complexity  EVALUATION COMPLEXITY: Moderate   GOALS: Goals reviewed with patient? No  SHORT TERM GOALS: Target date: 09/08/21 Patient will be independent with initial HEP.  Goal status: ongoing  2.  Patient will be educated on strategies to decrease risk of falls.  Goal status: ongoing   LONG TERM GOALS: Target date: 10/06/21  1.  Patient will report 75% improvement in R shoulder pain to improve QOL.  Goal status: IN PROGRESS  2.  Patient to demonstrate improved upright posture with posterior shoulder girdle engaged to promote improved glenohumeral joint mobility. Baseline: decreased shoulder height on R side, decreased shoulder stability, scapular winging  Goal status: ongoing  3.  Patient to improve R shoulder AROM to  within patient's functional norm  without pain provocation to allow for increased ease of ADLs.  Baseline: measured w/PROM, minimal AROM, PROM taken 7/20 Goal status: IN PROGRESS  4.  Patient will demonstrate improved  functional UE and LE strength as demonstrated by >= 4/5 in all muscle groups . Goal status: IN PROGRESS  5.  Patient will report 4 on FOTO(patient outcome measure)  to demonstrate improved functional ability.  Baseline: 4, 28 Goal status: IN PROGRESS  6. Patient will score 34 or greater on Berg Balance test to demonstrate lower risk of falls. (MCID= 8 points) .  Baseline: 24, 30  Goal status: ongoing  7. Patient will demonstrate decreased fall risk by scoring < 25 sec on TUG and <20 on 5xSTS. Baseline: 57.28s, 25.72s, 7/20-21.35s, 48.95s Goal status: ongoing     PLAN: PT FREQUENCY: 3x/week  PT DURATION: 8 weeks  PLANNED INTERVENTIONS: Therapeutic exercises, Therapeutic activity, Neuromuscular re-education, Balance training, Gait training, Patient/Family education, Joint mobilization, Stair training, Orthotic/Fit training, and Manual therapy  PLAN FOR NEXT SESSION: NM re-ed, gait training   Andris Baumann, DPT 09/02/21 11:00 AM

## 2021-09-01 NOTE — Therapy (Signed)
OUTPATIENT PHYSICAL THERAPY TREATMENT   Patient Name: Aaron Dennis MRN: [id number] DOB:04-10-1955, 66 y.o., male Today's Date: 09/01/2021   PT End of Session - 09/01/21 1019     Visit Number 11    Date for PT Re-Evaluation 10/06/21    PT Start Time 1016    PT Stop Time 73    PT Time Calculation (min) 46 min    Equipment Utilized During Treatment Gait belt    Activity Tolerance Patient tolerated treatment well;Patient limited by pain    Behavior During Therapy WFL for tasks assessed/performed                     Past Medical History:  Diagnosis Date   GERD (gastroesophageal reflux disease)    Hypertension    Lung nodule    7 mm LUL nodule   Past Surgical History:  Procedure Laterality Date   IR RADIOLOGIST EVAL & MGMT  07/02/2021   IR RADIOLOGIST EVAL & MGMT  07/22/2021   Patient Active Problem List   Diagnosis Date Noted   Aneurysm, cerebral, nonruptured 08/13/2021   Status post stroke due to cerebrovascular disease    Cerebral edema ([place]) 04/29/2021   Stroke (cerebrum) ([place]) 04/29/2021   Right sided weakness    Acute CVA (cerebrovascular accident) ([place]) 04/27/2021   Primary osteoarthritis of left knee 01/13/2019   Primary osteoarthritis of right knee 01/13/2019   Flat foot 11/28/2017   Tendonitis of foot 11/28/2017   Polycythemia 05/12/2017   Vitamin D deficiency 04/21/2017   Bony sclerosis 08/05/2016   Lung nodule    Chronic pain of both knees 12/05/2015   Gastroesophageal reflux disease without esophagitis 05/17/2014   Essential hypertension 05/17/2014   Coronary artery calcification 08/17/2012   Abnormal LFTs (liver function tests) 07/11/2012   Chronic abdominal pain 06/24/2012   Right-sided chest wall pain 06/24/2012   Constipation, chronic 06/24/2012   Hyperbilirubinemia 06/24/2012   Chronic back pain 06/24/2012   DDD (degenerative disc disease), thoracolumbar 06/24/2012   History of nephrolithiasis 06/24/2012   Side pain 06/24/2012    Low back pain at multiple sites 06/24/2012    PCP: [name]  REFERRING PROVIDER: [name]  REFERRING DIAG: M25.511  THERAPY DIAG:  Hemiplegia and hemiparesis following cerebral infarction affecting right dominant side (HCC)  Muscle weakness (generalized)  Other symptoms and signs involving the nervous system  Status post stroke due to cerebrovascular disease  Other abnormalities of gait and mobility  Cerebrovascular accident (CVA) due to embolism of left middle cerebral artery (HCC)  Abnormal posture  Chronic bilateral low back pain without sciatica  Rationale for Evaluation and Treatment Rehabilitation  ONSET DATE: 07/15/21  SUBJECTIVE:  SUBJECTIVE STATEMENT: Has been awake since 5AM so is tired, no pain, no falls.   PERTINENT HISTORY: Acute CVA 3/19  PAIN:  Are you having pain? No  PRECAUTIONS: Fall  WEIGHT BEARING RESTRICTIONS No  FALLS:  Has patient fallen in last 6 months? Yes. Number of falls 4  LIVING ENVIRONMENT: Lives with: lives with their family Lives in: House/apartment Stairs: Yes: External: 2 steps; none Has following equipment at home: Single point cane, Hemi walker, Wheelchair (manual), and Grab bars  OCCUPATION: retired  PLOF: [place] try to get stronger, try to use my legs and feet   OBJECTIVE:   DIAGNOSTIC FINDINGS:  Right shoulder 3 views: Shoulder is well located.  Subacromial space well-maintained.  Type II downsloping acromion.  No acute fractures or acute findings Right wrist 3 views: No acute fractures.  No subluxation dislocation of  the carpal bones.  Wrist joints well-maintained.  No bony abnormalities.  PATIENT SURVEYS:  FOTO 4  COGNITION:  Overall cognitive status: Within functional limits for tasks  assessed     SENSATION: WFL  POSTURE: Decreased R shoulder height, hemiparesis, forward head, flexed trunk, R lateral lean  UPPER EXTREMITY ROM:   Passive ROM Right eval 08/28/21  Shoulder flexion 60d w/pain 62  W/pain  Shoulder extension    Shoulder abduction 80d w/pain  78 w/pain  Shoulder adduction    Shoulder internal rotation Full w/end range pain   Shoulder external rotation 40d w/pain 35 w/pain  Elbow flexion full   Elbow extension full   Wrist flexion    Wrist extension    Wrist ulnar deviation    Wrist radial deviation    Wrist pronation    Wrist supination    (Blank rows = not tested)  UPPER EXTREMITY MMT: L [facility]  MMT Right eval 08/28/21  Shoulder flexion 1 1  Shoulder extension    Shoulder abduction    Shoulder adduction 2- 2-  Shoulder internal rotation 2- 2  Shoulder external rotation 2- 2-  Middle trapezius    Lower trapezius    Elbow flexion 2+ 2+  Elbow extension 2- 2  Wrist flexion    Wrist extension    Wrist ulnar deviation    Wrist radial deviation    Wrist pronation    Wrist supination    Grip strength (lbs)    (Blank rows = not tested)  LE MMT 3/5 hip flexion, knee ext, knee flexion  Using hemiwalker, decrease R foot clearance, R foot ER, decrease hip flexion, decreased stance time on R LE, decreased step length, foot drop  SHOULDER SPECIAL TESTS:  Impingement tests: Neer impingement test: positive , Hawkins/Kennedy impingement test: positive , and Painful arc test: positive     Instability tests: Apprehension test: positive  and Sulcus sign: positive   JOINT MOBILITY TESTING:  Pain with PROM, muscle guarding, instability in R shoulder, scapular winging  PALPATION:  No TTP   FUNCTIONAL MEASURES Eval BERG 24/56 TUG 57.28s--slowed gait using hemiwalker, no foot clearance and decreased safety awareness esp with turns 5xSTS-- 25.72s, LUE push off, decreased control w/descending   TODAY'S TREATMENT:  09/01/21 Nustep L5 x32mns   Obstacle course (step up 4", ladder, step up on airex) [facility] around gym  Knee ext 5# x10, 10# RLE eccentric  Knee flexion 20# x10, 10# x10 RLE  Leg press   08/28/21 Reasses goals for progress note TUG 48.95s 5xSTS 20.35s Nustep L5x641ms Walking w/o AD 2 laps  Calf raises 2x10  08/26/21 Nustep L5 x63mns Walking w/o AD 2 laps  STS from elevated mat table 2x10 Fitter 2x10  Bridges 2x10 SLR 2x10 RLE    08/25/21 Nustep L6 x591ms HS curls 20# 2x10, RLE only #10 x10 Knee ext 10# x10, eccentric x10, 5# RLE only x10  Step ups 4" Walking w/o AD in // bars forwards, backwards, side steps    08/21/21 Patient reports L lateral knee pain. His ITB is very tight. Therapist performed STM to ITB followed by stretch in supine with knee to opposite chest. Educated him to use tennis ball for further STM at home. Patient noted to overuse L side with all mobility, demosntrates decreased WB through RLE at all times, probably due to sensory changes and weakness with patient not trusting RLE. Educated him to the need to incorporate RLE into all mobility. Therapist first facilitated lower trunk activation and mobilization sitting on physiodisc-performed ant/post, Lat, and then circular lower trunk motions, required min-mod TC for correct technique. Moved sit to stand, educating patient with VC and tactile facilitation to remain centered with equal WB through both legs. Facilitated RLE activation. Performed repeated sit to stand, required min-mod facilit  08/19/21 NM re-ed for R pelvis and shoulder girdle-Sidelie, R pelvic mobility-ant/post, sup/inf, and into diagonals, min TC and VC for correct techqniue and isolated movement. He then mobilized R scapula in same planes, with more limited movement, moved to active shoulder protraction and retraction and active hip abd with forward and back swing to engage motor stability. Moved to sit and continued to perform reaching activities to  engage R scapula and shoulder.He then moved to steps and performed step ups x 10 with RLE on 4" step, min A to facilitate correct weight shift and active RLE control.  Ambulated without AD, CGA, VC to increase strep length, no unsteadiness noted.  08/18/21 NuStep L3 x 6 min S2S x10, initially using LUE then progressing to none then 2x5 with lower elevation LAQ RLE 3# 2x10 Hamstring curls 20# 2x10, RLE 10# 2x10 Ambulate one lap with hemiwalker Toe taps on 6" box 1x10 using hemiwalker  08/14/21 NuStep L3 x5 min LE only S2S 3x5 LUE assist elevated mat Alt box taps 6in then 8in w/ hemi walker x10 each  Steps 4 in 9 steps alt pattern one rails L  Attempted 6 inch steps RLE did give out on him when ascending requiring Max assist to regain balance. Hamstring curls 20lb 2x10, RLE 10lb x10 Leg Ext 10lb x10, 5lb x10  LAQ RLE 3lb 2x10    08/07/21 Amb with shopping cart 1 x 75'. Patient demonstrated step through gait, RLE with increased flexor moment at hip and knee in stance, but stable. Nu-Step 3 min at L4, then 3 x 30 sec at L7, legs only, focused push. Heel raises with BUE support on shopping cart. Step ups on 4" step, BUE support LLE step from floor to third step an dback, x 10 STM to ITB on R due to report of R knee pain. F/B stretch to ITB. L sidelie  with R hip abd, moving R hip forward and back while holding the abd. Supine U bridge on R. BERG TUG  5xSTS  Nustep L2, x6m33m Hamstring curls redTB 2x10  LAQ 2x10 Heel taps 4" 2HHA support by stairs  PATIENT EDUCATION: Education details: POC Person educated: Patient and brother Education method: ExpCustomer service managerucation comprehension: verbalized understanding   HOME EXERCISE PROGRAM: TBD   ASSESSMENT:  CLINICAL IMPRESSION:  Treatment session focused on gait training, taking longer steps and increasing speed. We completed an obstacle course, requires min-modA with some LOB noted. He is able to take larger steps  with hand held assist for speed but still has poor foot clearance and is not wearing his AFO. Will bring it next visit. Finished with LE strengthening, difficulty getting in and out of leg press.   OBJECTIVE IMPAIRMENTS Abnormal gait, decreased mobility, difficulty walking, decreased ROM, decreased strength, decreased safety awareness, and pain.   ACTIVITY LIMITATIONS carrying, lifting, transfers, bathing, toileting, dressing, reach over head, and locomotion level  PARTICIPATION LIMITATIONS: cleaning, laundry, driving, community activity, and yard work  PERSONAL FACTORS Age, Time since onset of injury/illness/exacerbation, and 3+ comorbidities: chronic pain, acute CVA, stroke, OA  are also affecting patient's functional outcome.   REHAB POTENTIAL: Fair due to time since stroke and increased deficits on R side  CLINICAL DECISION MAKING: Evolving/moderate complexity  EVALUATION COMPLEXITY: Moderate   GOALS: Goals reviewed with patient? No  SHORT TERM GOALS: Target date: 09/08/21 Patient will be independent with initial HEP.  Goal status: ongoing  2.  Patient will be educated on strategies to decrease risk of falls.  Goal status: ongoing   LONG TERM GOALS: Target date: 10/06/21  1.  Patient will report 75% improvement in R shoulder pain to improve QOL.  Goal status: IN PROGRESS  2.  Patient to demonstrate improved upright posture with posterior shoulder girdle engaged to promote improved glenohumeral joint mobility. Baseline: decreased shoulder height on R side, decreased shoulder stability, scapular winging  Goal status: ongoing  3.  Patient to improve R shoulder AROM to  within patient's functional norm  without pain provocation to allow for increased ease of ADLs.  Baseline: measured w/PROM, minimal AROM, PROM taken 7/20 Goal status: IN PROGRESS  4.  Patient will demonstrate improved functional UE and LE strength as demonstrated by >= 4/5 in all muscle groups . Goal status: IN  PROGRESS  5.  Patient will report 71 on FOTO(patient outcome measure)  to demonstrate improved functional ability.  Baseline: 4, 28 Goal status: IN PROGRESS  6. Patient will score 34 or greater on Berg Balance test to demonstrate lower risk of falls. (MCID= 8 points) .  Baseline: 24, 30  Goal status: ongoing  7. Patient will demonstrate decreased fall risk by scoring < 25 sec on TUG and <20 on 5xSTS. Baseline: 57.28s, 25.72s, 7/20-21.35s, 48.95s Goal status: ongoing     PLAN: PT FREQUENCY: 3x/week  PT DURATION: 8 weeks  PLANNED INTERVENTIONS: Therapeutic exercises, Therapeutic activity, Neuromuscular re-education, Balance training, Gait training, Patient/Family education, Joint mobilization, Stair training, Orthotic/Fit training, and Manual therapy  PLAN FOR NEXT SESSION: NM re-ed, gait training   Andris Baumann, DPT 09/01/21 11:04 AM

## 2021-09-02 ENCOUNTER — Encounter: Payer: Self-pay | Admitting: Occupational Therapy

## 2021-09-02 ENCOUNTER — Encounter (HOSPITAL_COMMUNITY): Payer: Self-pay

## 2021-09-02 ENCOUNTER — Ambulatory Visit: Payer: Medicare Other | Admitting: Occupational Therapy

## 2021-09-02 ENCOUNTER — Ambulatory Visit: Payer: Medicare Other

## 2021-09-02 ENCOUNTER — Other Ambulatory Visit: Payer: Self-pay

## 2021-09-02 ENCOUNTER — Emergency Department (HOSPITAL_COMMUNITY)
Admission: EM | Admit: 2021-09-02 | Discharge: 2021-09-02 | Discharge status: Against Medical Advice | Payer: Medicare Other | Attending: Emergency Medicine | Admitting: Emergency Medicine

## 2021-09-02 DIAGNOSIS — Z5321 Procedure and treatment not carried out due to patient leaving prior to being seen by health care provider: Secondary | ICD-10-CM | POA: Insufficient documentation

## 2021-09-02 DIAGNOSIS — M545 Low back pain, unspecified: Secondary | ICD-10-CM | POA: Diagnosis not present

## 2021-09-02 DIAGNOSIS — R293 Abnormal posture: Secondary | ICD-10-CM | POA: Diagnosis not present

## 2021-09-02 DIAGNOSIS — R29818 Other symptoms and signs involving the nervous system: Secondary | ICD-10-CM

## 2021-09-02 DIAGNOSIS — R1032 Left lower quadrant pain: Secondary | ICD-10-CM | POA: Diagnosis not present

## 2021-09-02 DIAGNOSIS — M25511 Pain in right shoulder: Secondary | ICD-10-CM | POA: Diagnosis not present

## 2021-09-02 DIAGNOSIS — M6281 Muscle weakness (generalized): Secondary | ICD-10-CM | POA: Diagnosis not present

## 2021-09-02 DIAGNOSIS — R1030 Lower abdominal pain, unspecified: Secondary | ICD-10-CM | POA: Diagnosis not present

## 2021-09-02 DIAGNOSIS — I69351 Hemiplegia and hemiparesis following cerebral infarction affecting right dominant side: Secondary | ICD-10-CM | POA: Diagnosis not present

## 2021-09-02 DIAGNOSIS — Z8673 Personal history of transient ischemic attack (TIA), and cerebral infarction without residual deficits: Secondary | ICD-10-CM | POA: Diagnosis not present

## 2021-09-02 DIAGNOSIS — R109 Unspecified abdominal pain: Secondary | ICD-10-CM | POA: Diagnosis not present

## 2021-09-02 DIAGNOSIS — Z743 Need for continuous supervision: Secondary | ICD-10-CM | POA: Diagnosis not present

## 2021-09-02 DIAGNOSIS — R2689 Other abnormalities of gait and mobility: Secondary | ICD-10-CM | POA: Diagnosis not present

## 2021-09-02 DIAGNOSIS — K5900 Constipation, unspecified: Secondary | ICD-10-CM | POA: Insufficient documentation

## 2021-09-02 DIAGNOSIS — I63412 Cerebral infarction due to embolism of left middle cerebral artery: Secondary | ICD-10-CM | POA: Diagnosis not present

## 2021-09-02 DIAGNOSIS — G8929 Other chronic pain: Secondary | ICD-10-CM | POA: Diagnosis not present

## 2021-09-02 DIAGNOSIS — M25531 Pain in right wrist: Secondary | ICD-10-CM

## 2021-09-02 LAB — URINALYSIS, ROUTINE W REFLEX MICROSCOPIC
Bilirubin Urine: NEGATIVE
Glucose, UA: NEGATIVE mg/dL
Ketones, ur: NEGATIVE mg/dL
Leukocytes,Ua: NEGATIVE
Nitrite: NEGATIVE
Protein, ur: NEGATIVE mg/dL
RBC / HPF: >50 RBC/hpf — ABNORMAL HIGH (ref 0–5)
Specific Gravity, Urine: 1.017 (ref 1.005–1.030)
pH: 7 (ref 5.0–8.0)

## 2021-09-02 LAB — COMPREHENSIVE METABOLIC PANEL
ALT: 16 U/L (ref 0–44)
AST: 18 U/L (ref 15–41)
Albumin: 3.9 g/dL (ref 3.5–5.0)
Alkaline Phosphatase: 103 U/L (ref 38–126)
Anion gap: 7 (ref 5–15)
BUN: 18 mg/dL (ref 8–23)
CO2: 27 mmol/L (ref 22–32)
Calcium: 9.3 mg/dL (ref 8.9–10.3)
Chloride: 107 mmol/L (ref 98–111)
Creatinine, Ser: 0.88 mg/dL (ref 0.61–1.24)
GFR, Estimated: >60 mL/min (ref >60)
Glucose, Bld: 94 mg/dL (ref 70–99)
Potassium: 3.1 mmol/L — ABNORMAL LOW (ref 3.5–5.1)
Sodium: 141 mmol/L (ref 135–145)
Total Bilirubin: 1.7 mg/dL — ABNORMAL HIGH (ref 0.3–1.2)
Total Protein: 7.6 g/dL (ref 6.5–8.1)

## 2021-09-02 LAB — CBC
HCT: 47.3 % (ref 39.0–52.0)
Hemoglobin: 15.5 g/dL (ref 13.0–17.0)
MCH: 27.4 pg (ref 26.0–34.0)
MCHC: 32.8 g/dL (ref 30.0–36.0)
MCV: 83.6 fL (ref 80.0–100.0)
Platelets: 223 10*3/uL (ref 150–400)
RBC: 5.66 MIL/uL (ref 4.22–5.81)
RDW: 13.8 % (ref 11.5–15.5)
WBC: 7.7 10*3/uL (ref 4.0–10.5)
nRBC: 0 % (ref 0.0–0.2)

## 2021-09-02 LAB — LIPASE, BLOOD: Lipase: 28 U/L (ref 11–51)

## 2021-09-02 NOTE — ED Triage Notes (Signed)
Pt arrives via EMS from home. Pt c/o left lower quadrant pain that started today. Pt reports he has been having issues with constipation. Reports he did have a bm today.

## 2021-09-02 NOTE — ED Triage Notes (Signed)
Waiting for IV for their CT 

## 2021-09-02 NOTE — Patient Instructions (Signed)
Grip Strengthening    Squeeze putty using thumb and all fingers. Repeat 20 times. Do 2-3 sessions per day.   Rolling and Pinching    Roll putty back and forth, being sure to use all fingertips. Then pinch putty between thumb and each fingertip in turn after rolling out Repeat 3-5 times along the length of the putty. Do 2-3 sessions per day.

## 2021-09-02 NOTE — Therapy (Signed)
OUTPATIENT OCCUPATIONAL THERAPY TREATMENT NOTE  Patient Name: Aaron Dennis MRN: [id number] DOB:[birth year], 66 y.o., male Today's Date: 09/02/2021  PCP: Charlott Rakes, MD REFERRING PROVIDER: Pete Pelt, PA-C   OT End of Session - 09/02/21 1059     Visit Number 12    Number of Visits 25    Date for OT Re-Evaluation 10/03/21    Authorization Type UHC Medicare; Medicaid [place] (secondary)    Authorization Time Period VL: [place]    Progress Note Due on Visit 67    OT Start Time 1059    OT Stop Time 1143    OT Time Calculation (min) 44 min    Activity Tolerance Patient tolerated treatment well    Behavior During Therapy WFL for tasks assessed/performed            Past Medical History:  Diagnosis Date   GERD (gastroesophageal reflux disease)    Hypertension    Lung nodule    7 mm LUL nodule   Past Surgical History:  Procedure Laterality Date   IR RADIOLOGIST EVAL & MGMT  07/02/2021   IR RADIOLOGIST EVAL & MGMT  07/22/2021   Patient Active Problem List   Diagnosis Date Noted   Aneurysm, cerebral, nonruptured 08/13/2021   Status post stroke due to cerebrovascular disease    Cerebral edema ([place]) 04/29/2021   Stroke (cerebrum) ([place]) 04/29/2021   Right sided weakness    Acute CVA (cerebrovascular accident) ([place]) 04/27/2021   Primary osteoarthritis of left knee 01/13/2019   Primary osteoarthritis of right knee 01/13/2019   Flat foot 11/28/2017   Tendonitis of foot 11/28/2017   Polycythemia 05/12/2017   Vitamin D deficiency 04/21/2017   Bony sclerosis 08/05/2016   Lung nodule    Chronic pain of both knees 12/05/2015   Gastroesophageal reflux disease without esophagitis 05/17/2014   Essential hypertension 05/17/2014   Coronary artery calcification 08/17/2012   Abnormal LFTs (liver function tests) 07/11/2012   Chronic abdominal pain 06/24/2012   Right-sided chest wall pain 06/24/2012   Constipation, chronic 06/24/2012   Hyperbilirubinemia 06/24/2012   Chronic back pain  06/24/2012   DDD (degenerative disc disease), thoracolumbar 06/24/2012   History of nephrolithiasis 06/24/2012   Side pain 06/24/2012   Low back pain at multiple sites 06/24/2012    ONSET DATE: 07/17/21 (date of OT order)  REFERRING DIAG: M25.531 (ICD-10-CM) - Pain in right wrist M25.511 (ICD-10-CM) - Acute pain of right shoulder   THERAPY DIAG:  Hemiplegia and hemiparesis following cerebral infarction affecting right dominant side (HCC)  Muscle weakness (generalized)  Other symptoms and signs involving the nervous system  Acute pain of right shoulder  Pain in right wrist  Rationale for Evaluation and Treatment Rehabilitation  SUBJECTIVE:   SUBJECTIVE STATEMENT: Pt reports he did not sleep well last night because his wife got home from the hospital yesterday after surgery and was up a lot throughout the night due to pain "My arm doesn't hurt when I don't move it" Pt accompanied by: self and family member (brother, Selinda Michaels, drove pt to clinic)  PAIN: Are you having pain? No; continues to report pain w/ L shoulder movement  PERTINENT HISTORY: L frontoparietal CVA w/ residual R-sided weakness, presented to ED 04/27/21 after recent prolonged travel; PMH includes h/o polycythemia, HTN  PRECAUTIONS: Fall  PLOF: Independent and Vocation/Vocational requirements: retired Architect  PATIENT GOALS "Work on my shoulder"   OBJECTIVE:   ------------------------------------------------------------------------------------------------------------------------------------------------------  UPPER EXTREMITY ROM: LUE WFL; RUE measurements below  Active ROM Right Eval -  6/26 Right 7/20  Shoulder flexion Unable 30%  Shoulder abduction Unable 40%  Shoulder adduction WFL   Shoulder extension 24   Shoulder internal rotation [facility]   Shoulder external rotation Unable Unable due to pain  Elbow flexion WFL   Elbow extension 33 90%  Wrist flexion 27 80%  Wrist extension 28   Wrist  pronation To be assessed   Wrist supination To be assessed   (Blank rows = not tested)  UPPER EXTREMITY MMT:     MMT Right Eval - 6/26 Right 7/20  Shoulder flexion 1/5 2-/5  Shoulder abduction 1/5 2+/5  Shoulder extension 3-/5   Shoulder internal rotation 2/5   Shoulder external rotation 2-/5   Elbow flexion 3/5 3/5  Elbow extension 2-/5 2+/5  Wrist flexion 2-/5 2+/5  Wrist extension 2-/5 2+/5  Wrist pronation 2+/5 2+/5  Wrist supination 2+/5 2+/5  (Blank rows = not tested)  ------------------------------------------------------------------------------------------------------------------------------------------------------  TODAY'S TREATMENT - 09/02/21:  Towel Slides Towel slides w/ BUEs at standard tabletop height while seated to target shoulder flex/elbow ext for increased ROM and stretch. Completed 2x10 w/ OT providing initial tactile facilitation and min verbal cues for consistency w/ arc of motion each rep and decreasing compensatory movement patterns. Pt reported decreased discomfort/stiffness w/ increased repetitions.  Putty Exercises Gross grasp of putty w/ R hand, focusing on attempting movement pattern against resistance opposed to straining to force movement; completed 25x w/ yellow, soft putty  Rolling putty out w/ R hand and then completing thumb-to-index finger opposition 3x along length of putty w/ yellow, soft level putty. OT provided facilitation to separate sides of the hand for isolated 2-pt pinch; mod difficulty achieving pad-to-pad prehension  Wrist extension against yellow, soft putty completed 2x5 w/ OT stabilizing putty to prevent overcompensating w/ LUE    PATIENT EDUCATION: Ongoing condition-specific education, answering pt questions prn Person educated: Patient Education method: Explanation Education comprehension: verbalized understanding   HOME EXERCISE PROGRAM: Putty Exercises (see pt instructions) MedBridge Code: [id number] - Seated Shoulder  Shrugs  - 3 x daily - 7 x weekly - 1 sets - 15 reps - Seated Shoulder Shrug Circles AROM Backward  - 3 x daily - 7 x weekly - 1 sets - 15 reps - Seated Scapular Retraction  - 3 x daily - 7 x weekly - 1 sets - 15 reps - Seated Shoulder External Rotation AAROM with Dowel  - 3 x daily - 7 x weekly - 1 sets - 10 reps - Supine Shoulder Flexion AAROM with Hands Clasped  - 3 x daily - 7 x weekly - 1 sets - 10 reps - Seated Elbow Flexion and Extension AROM  - 3 x daily - 7 x weekly - 2 sets - 10 reps - Seated Forearm Pronation and Supination AROM  - 3 x daily - 7 x weekly - 2 sets - 10 reps - Wrist AROM Flexion Extension  - 3 x daily - 7 x weekly - 2 sets - 10 reps   GOALS: Goals reviewed with patient? Yes  SHORT TERM GOALS: Target date: 09/01/21  STG  Status:  1 Pt will demonstrate understanding of initial HEP designed for RUE ROM Baseline: No HEP at this time Met - 09/02/21  2 Pt to improve R shoulder flexion strength to at least 2-/5 (partial ROM, gravity min) Baseline: shoulder flex MMT: 1/5 Met - 08/25/21  3 Pt will be able to grasp and lift lightweight object to mouth w/ R, dominant UE in at least  2 trials Baseline: Significantly limited gross grasp Met - 08/25/21  4 Pt will achieve at least 50% of R wrist extension AROM against gravity Baseline: partial ROM in horizontal plane Progressing     LONG TERM GOALS: Target date: 10/03/21  LTG  Status:  1 Pt will improve FOTO score to at least 45 to indicate improved participation in functional activities Baseline: Intake - 4 Progressing  2 Pt will be able to achieve at least partial AROM of R shoulder flexion against gravity by discharge for improved functional use of RUE Baseline: shoulder flex MMT: 1/5 Progressing  3 Pt will be able to complete at least 10 blocks during Box and Blocks Test to indicated improved unilateral GMC of R, dominant UE Baseline: Unable at time of evaluation Progressing  4 Pt will be able to demonstrate incorporation  of RUE as gross assist in at least 1 functional task (pulling up pants, stabilizing plate, etc.) Baseline: Not incorporating LUE at gross level at time of eval Progressing     ASSESSMENT:  CLINICAL IMPRESSION: Pt continues to make slow progress toward goals, benefiting from ongoing encouragement and education regarding neuro reed and motor learning. Session today focused on continue RUE shoulder flexion ROM and hand gross grasp and release for improved participation in functional use of R hand during ADLs. Pt declined incorporation of NMES due to reported fear after OT asked about contraindications last session; pt again confirmed no known contraindications and does report completing estim at home, but OT did not proceed per pt's request and instead focused on introducing putty exercises. Able to complete exercises w/ improving alignment and positioning w/ increased repetition. Pt also appeared more frustrated this session, but demonstrated/verbalized increased awareness of using RUE before compensating w/ LUE this session. Follow-up w/ ortho MD for shoulder pain is tomorrow afternoon.  PERFORMANCE DEFICITS in functional skills including ADLs, IADLs, coordination, dexterity, proprioception, sensation, tone, ROM, strength, pain, FMC, GMC, mobility, balance, body mechanics, decreased knowledge of use of DME, and UE functional use, cognitive skills including safety awareness, and psychosocial skills including environmental adaptation.   IMPAIRMENTS are limiting patient from ADLs, IADLs, rest and sleep, and work.   COMORBIDITIES may have co-morbidities  that affects occupational performance. Patient will benefit from skilled OT to address above impairments and improve overall function.   PLAN: OT FREQUENCY: 3x/week  OT DURATION: 8 weeks  PLANNED INTERVENTIONS: self care/ADL training, therapeutic exercise, therapeutic activity, neuromuscular re-education, manual therapy, passive range of motion,  balance training, functional mobility training, aquatic therapy, splinting, electrical stimulation, ultrasound, iontophoresis, moist heat, cryotherapy, patient/family education, and DME and/or AE instructions  RECOMMENDED OTHER SERVICES: Currently receiving PT services at this location  CONSULTED AND AGREED WITH PLAN OF CARE: Patient and family member/caregiver  PLAN FOR NEXT SESSION: Continue to target RUE GM and [place], coordination, and strength; wb and somatosensory input for [place], MSOT, OTR/L 09/02/2021, 11:48 AM

## 2021-09-02 NOTE — ED Provider Triage Note (Signed)
Emergency Medicine Provider Triage Evaluation Note  Aaron Dennis , a 66 y.o. male  was evaluated in triage.  Pt complains of left lower quadrant pain.  Began earlier today.  Feels like he is slightly more constipated than normal.  Denies any melena or blood per rectum.  Pain goes into his back. No Nausea, vomiting.  Review of Systems  Positive: Abd pain Negative:   Physical Exam  BP 137/74   Pulse 70   Temp 97.9 F (36.6 C)   Resp 17   Ht '5\' 10"'$  (1.778 m)   Wt 71.7 kg   SpO2 100%   BMI 22.67 kg/m  Gen:   Awake, no distress   Resp:  Normal effort  ABD:  Tenderness to LLQ MSK:   Moves extremities without difficulty  Other:    Medical Decision Making  Medically screening exam initiated at 5:27 PM.  Appropriate orders placed.  Aaron Dennis was informed that the remainder of the evaluation will be completed by another provider, this initial triage assessment does not replace that evaluation, and the importance of remaining in the ED until their evaluation is complete.  LLQ pain   Laurelyn Terrero A, PA-C 09/02/21 1728

## 2021-09-02 NOTE — ED Notes (Signed)
Pt states that he will just go home and die. Pt left the facility.

## 2021-09-03 ENCOUNTER — Emergency Department (HOSPITAL_COMMUNITY): Payer: Medicare Other

## 2021-09-03 ENCOUNTER — Encounter (HOSPITAL_COMMUNITY): Payer: Self-pay

## 2021-09-03 ENCOUNTER — Ambulatory Visit: Payer: Medicare Other | Admitting: Orthopaedic Surgery

## 2021-09-03 ENCOUNTER — Emergency Department (HOSPITAL_COMMUNITY)
Admission: EM | Admit: 2021-09-03 | Discharge: 2021-09-03 | Disposition: A | Discharge status: Home / Self Care | Payer: Medicare Other | Attending: Emergency Medicine | Admitting: Emergency Medicine

## 2021-09-03 ENCOUNTER — Other Ambulatory Visit: Payer: Self-pay

## 2021-09-03 DIAGNOSIS — I10 Essential (primary) hypertension: Secondary | ICD-10-CM | POA: Insufficient documentation

## 2021-09-03 DIAGNOSIS — E871 Hypo-osmolality and hyponatremia: Secondary | ICD-10-CM | POA: Diagnosis not present

## 2021-09-03 DIAGNOSIS — K5900 Constipation, unspecified: Secondary | ICD-10-CM | POA: Insufficient documentation

## 2021-09-03 DIAGNOSIS — Z79899 Other long term (current) drug therapy: Secondary | ICD-10-CM | POA: Insufficient documentation

## 2021-09-03 DIAGNOSIS — R109 Unspecified abdominal pain: Secondary | ICD-10-CM | POA: Diagnosis not present

## 2021-09-03 DIAGNOSIS — Z7982 Long term (current) use of aspirin: Secondary | ICD-10-CM | POA: Insufficient documentation

## 2021-09-03 DIAGNOSIS — R1032 Left lower quadrant pain: Secondary | ICD-10-CM | POA: Diagnosis present

## 2021-09-03 DIAGNOSIS — I700 Atherosclerosis of aorta: Secondary | ICD-10-CM | POA: Diagnosis not present

## 2021-09-03 LAB — URINALYSIS, ROUTINE W REFLEX MICROSCOPIC
Bilirubin Urine: NEGATIVE
Glucose, UA: NEGATIVE mg/dL
Ketones, ur: 5 mg/dL — AB
Leukocytes,Ua: NEGATIVE
Nitrite: NEGATIVE
Protein, ur: NEGATIVE mg/dL
Specific Gravity, Urine: 1.017 (ref 1.005–1.030)
pH: 5 (ref 5.0–8.0)

## 2021-09-03 LAB — CBC
HCT: 45.7 % (ref 39.0–52.0)
Hemoglobin: 15.1 g/dL (ref 13.0–17.0)
MCH: 27.2 pg (ref 26.0–34.0)
MCHC: 33 g/dL (ref 30.0–36.0)
MCV: 82.3 fL (ref 80.0–100.0)
Platelets: 228 10*3/uL (ref 150–400)
RBC: 5.55 MIL/uL (ref 4.22–5.81)
RDW: 13.4 % (ref 11.5–15.5)
WBC: 8.2 10*3/uL (ref 4.0–10.5)
nRBC: 0 % (ref 0.0–0.2)

## 2021-09-03 LAB — COMPREHENSIVE METABOLIC PANEL
ALT: 16 U/L (ref 0–44)
AST: 19 U/L (ref 15–41)
Albumin: 3.8 g/dL (ref 3.5–5.0)
Alkaline Phosphatase: 112 U/L (ref 38–126)
Anion gap: 10 (ref 5–15)
BUN: 14 mg/dL (ref 8–23)
CO2: 24 mmol/L (ref 22–32)
Calcium: 9 mg/dL (ref 8.9–10.3)
Chloride: 106 mmol/L (ref 98–111)
Creatinine, Ser: 0.8 mg/dL (ref 0.61–1.24)
GFR, Estimated: >60 mL/min (ref >60)
Glucose, Bld: 101 mg/dL — ABNORMAL HIGH (ref 70–99)
Potassium: 3.1 mmol/L — ABNORMAL LOW (ref 3.5–5.1)
Sodium: 140 mmol/L (ref 135–145)
Total Bilirubin: 1.9 mg/dL — ABNORMAL HIGH (ref 0.3–1.2)
Total Protein: 7.3 g/dL (ref 6.5–8.1)

## 2021-09-03 MED ORDER — IOHEXOL 350 MG/ML SOLN
100.0000 mL | Freq: Once | INTRAVENOUS | Status: AC | PRN
  Administered 2021-09-03: 100 mL via INTRAVENOUS

## 2021-09-03 MED ORDER — POTASSIUM CHLORIDE CRYS ER 20 MEQ PO TBCR
60.0000 meq | EXTENDED_RELEASE_TABLET | Freq: Once | ORAL | Status: AC
  Administered 2021-09-03: 60 meq via ORAL
  Filled 2021-09-03: qty 3

## 2021-09-03 NOTE — ED Provider Notes (Signed)
[facility] EMERGENCY DEPARTMENT Provider Note   CSN: [id number] Arrival date & time: 09/03/21  6433     History  Chief Complaint  Patient presents with   Abdominal Pain    Aaron Dennis is a 66 y.o. male with medical history of GERD, hypertension.  Patient presents with son to the ED for evaluation of left lower quadrant abdominal pain.  The patient states that yesterday around 3:30 PM he was lying down when he began experiencing left lower quadrant abdominal pain rated at 5 out of 10.  The patient states that this pain comes and goes, is not able to tell me any sort of alleviating or aggravating factors.  The patient describes the pain as colicky, cramping.  Patient states he recently had a stroke and is currently going to physical therapy.  Patient states that he last had a successful bowel movement 2 days ago and had to strain excessively.  Patient reports that he has been taking MiraLAX every day for the last 6 months however 4 days ago stopped taking MiraLAX for unknown reason.  The patient denies any fevers, nausea, vomiting, diarrhea, dysuria, back pain.   Abdominal Pain Associated symptoms: constipation   Associated symptoms: no chills, no diarrhea, no dysuria, no fever, no nausea and no vomiting        Home Medications Prior to Admission medications   Medication Sig Start Date End Date Taking? Authorizing Provider  acetaminophen (TYLENOL) 325 MG tablet Take 1-2 tablets (325-650 mg total) by mouth every 4 (four) hours as needed for mild pain. 05/19/21   Setzer, Edman Circle, PA-C  amLODipine (NORVASC) 5 MG tablet Take 1 tablet (5 mg total) by mouth daily. 06/11/21 06/11/22  Charlott Rakes, MD  aspirin 81 MG EC tablet Take 1 tablet (81 mg total) by mouth daily. Swallow whole. 06/11/21   Charlott Rakes, MD  baclofen (LIORESAL) 10 MG tablet Take 0.5 tablets (5 mg total) by mouth 3 (three) times daily. X 1 week, then 10 mg 3x/day x 1 week; then IF needed, can go to 15 mg  3x/day- for spasticity 08/13/21   Lovorn, Jinny Blossom, MD  citalopram (CELEXA) 20 MG tablet Take 1 tablet (20 mg total) by mouth daily. 06/11/21   Charlott Rakes, MD  gabapentin (NEURONTIN) 300 MG capsule Take 1 capsule (300 mg total) by mouth 3 (three) times daily. 06/30/21   Jennye Boroughs, MD  omeprazole (PRILOSEC) 40 MG capsule Take 1 capsule (40 mg total) by mouth daily. 06/11/21 12/08/21  Charlott Rakes, MD  polyethylene glycol powder (GLYCOLAX/MIRALAX) 17 GM/SCOOP powder Take 17 grams by mouth daily. 06/11/21   Charlott Rakes, MD  rosuvastatin (CRESTOR) 20 MG tablet Take 1 tablet (20 mg total) by mouth daily. 06/11/21   Charlott Rakes, MD  tamsulosin (FLOMAX) 0.4 MG CAPS capsule Take 1 capsule (0.4 mg total) by mouth daily. 06/11/21   Charlott Rakes, MD  vitamin B-12 (CYANOCOBALAMIN) 1000 MCG tablet Take 1 tablet (1,000 mcg total) by mouth daily. 08/15/20   Charlott Rakes, MD      Allergies    Beef-derived products, Fish-derived products, and Pork-derived products    Review of Systems   Review of Systems  Constitutional:  Negative for chills and fever.  Gastrointestinal:  Positive for abdominal pain and constipation. Negative for blood in stool, diarrhea, nausea and vomiting.  Genitourinary:  Negative for dysuria.  Musculoskeletal:  Negative for back pain.  All other systems reviewed and are negative.   Physical Exam Updated Vital Signs  BP 130/84   Pulse 65   Temp 97.7 F (36.5 C) (Oral)   Resp 20   Ht '5\' 10"'$  (1.778 m)   Wt 71.7 kg   SpO2 100%   BMI 22.68 kg/m  Physical Exam  ED Results / Procedures / Treatments   Labs (all labs ordered are listed, but only abnormal results are displayed) Labs Reviewed  COMPREHENSIVE METABOLIC PANEL - Abnormal; Notable for the following components:      Result Value   Potassium 3.1 (*)    Glucose, Bld 101 (*)    Total Bilirubin 1.9 (*)    All other components within normal limits  URINALYSIS, ROUTINE W REFLEX MICROSCOPIC - Abnormal; Notable  for the following components:   APPearance HAZY (*)    Hgb urine dipstick MODERATE (*)    Ketones, ur 5 (*)    Bacteria, UA RARE (*)    All other components within normal limits  CBC    EKG None  Radiology No results found.  Procedures Procedures   Medications Ordered in ED Medications  potassium chloride SA (KLOR-CON M) CR tablet 60 mEq (60 mEq Oral Given 09/03/21 0933)  iohexol (OMNIPAQUE) 350 MG/ML injection 100 mL (100 mLs Intravenous Contrast Given 09/03/21 1316)    ED Course/ Medical Decision Making/ A&P                           Medical Decision Making Amount and/or Complexity of Data Reviewed Labs: ordered.  Risk Prescription drug management.   66 year old male presents to the ED for evaluation of abdominal pain.  Please see HPI for further details.  On examination, the patient is afebrile and nontachycardic.  The patient lung sounds are clear bilaterally, he is not hypoxic on room air.  The patient abdomen is soft and compressible in all 4 quadrants over the patient does have focal tenderness to his left lower quadrant without rebound or guarding.  There is no overlying skin change to his abdomen.  The patient is nontoxic in appearance.  Patient worked up utilizing the following labs and imaging studies interpreted by me personally: - CMP with decreased sodium to 3.1 repleted with 60 mEq potassium - CBC unremarkable, no leukocytosis - Urinalysis shows rare bacteria, ketones, moderate hemoglobin - CT abdomen pelvis with contrast shows prostamegaly, sclerotic lesions in the spine which are seen on past imaging studies.  There is no findings to account for the patient's symptoms or pain  At this time, most likely cause of patient pain due to constipation and stool burden.  Patient will be advised to return home and takes MiraLAX as prescribed.  The patient was advised that if he has increased pain even after MiraLAX and stooling, develops nausea or vomiting, fevers  then he needs to return to ED for further management.  The patient and his son both voiced understanding of these instructions.  Patient has been advised to follow-up with his primary doctor this week for lab recheck as well as close follow-up.  The patient voices understanding of these instructions.  Patient has been counseled on potassium rich foods and drinks that he can utilize at home in order to stabilize potassium level.  Patient discussed with my attending Dr. Pearline Cables who voices agreement with management.  The patient is stable at this time for discharge home    Final Clinical Impression(s) / ED Diagnoses Final diagnoses:  Constipation, unspecified constipation type    Rx / DC Orders ED  Discharge Orders     None         Lawana Chambers 67/67/20 9470    [employer], [employer] P, DO 96/28/36 2103

## 2021-09-03 NOTE — Discharge Instructions (Signed)
Please return to the ED with any new or worsening symptoms such as nausea, vomiting, fevers, increased abdominal pain even after stooling Please follow-up with your primary care doctor this week.  Please have your labs redrawn to check your potassium levels. Please read attached guide concerning constipation Please read attached guide concerning foods that are high in potassium  Please go home and take your MiraLAX as directed.

## 2021-09-03 NOTE — ED Triage Notes (Signed)
Pt reports LLQ pain since yesterday, seen at [facility] last night but LWBS due to wait time, states they did labs and a urine sample. Pt having some nausea.

## 2021-09-03 NOTE — ED Notes (Signed)
DC instructions reviewed with pt. PT verbalized understanding. PT DC °

## 2021-09-03 NOTE — ED Provider Triage Note (Signed)
Emergency Medicine Provider Triage Evaluation Note  Aaron Dennis , a 66 y.o. male  was evaluated in triage.  Pt complains of left lower quadrant abdominal pain since yesterday at 330.  Patient reports he feels he is constipated.  Patient denies any flank pain, dysuria, decreased urine stream.  The patient is endorsing nausea without vomiting.  No diarrhea.  No fevers.  Review of Systems  Positive:  Negative:   Physical Exam  BP 127/90   Pulse 80   Temp 97.9 F (36.6 C) (Oral)   Resp 14   Ht '5\' 10"'$  (1.778 m)   Wt 71.7 kg   SpO2 99%   BMI 22.68 kg/m  Gen:   Awake, no distress   Resp:  Normal effort  MSK:   Moves extremities without difficulty  Other:    Medical Decision Making  Medically screening exam initiated at 9:37 AM.  Appropriate orders placed.  Adolf Ormiston [place] was informed that the remainder of the evaluation will be completed by another provider, this initial triage assessment does not replace that evaluation, and the importance of remaining in the ED until their evaluation is complete.     Azucena Cecil, [place] 09/03/21 [phone]

## 2021-09-04 ENCOUNTER — Ambulatory Visit: Payer: Medicare Other | Admitting: Occupational Therapy

## 2021-09-04 ENCOUNTER — Ambulatory Visit: Payer: Medicare Other | Admitting: Physical Therapy

## 2021-09-09 ENCOUNTER — Encounter: Payer: Self-pay | Admitting: Occupational Therapy

## 2021-09-09 ENCOUNTER — Ambulatory Visit: Payer: Medicare Other | Attending: Physician Assistant | Admitting: Occupational Therapy

## 2021-09-09 ENCOUNTER — Ambulatory Visit: Payer: Medicare Other | Admitting: Physical Therapy

## 2021-09-09 ENCOUNTER — Encounter: Payer: Self-pay | Admitting: Physical Therapy

## 2021-09-09 VITALS — BP 132/91 | HR 74

## 2021-09-09 DIAGNOSIS — R2689 Other abnormalities of gait and mobility: Secondary | ICD-10-CM | POA: Diagnosis not present

## 2021-09-09 DIAGNOSIS — R293 Abnormal posture: Secondary | ICD-10-CM | POA: Diagnosis not present

## 2021-09-09 DIAGNOSIS — M6281 Muscle weakness (generalized): Secondary | ICD-10-CM | POA: Insufficient documentation

## 2021-09-09 DIAGNOSIS — M25511 Pain in right shoulder: Secondary | ICD-10-CM | POA: Diagnosis not present

## 2021-09-09 DIAGNOSIS — Z8673 Personal history of transient ischemic attack (TIA), and cerebral infarction without residual deficits: Secondary | ICD-10-CM | POA: Insufficient documentation

## 2021-09-09 DIAGNOSIS — I63412 Cerebral infarction due to embolism of left middle cerebral artery: Secondary | ICD-10-CM | POA: Diagnosis not present

## 2021-09-09 DIAGNOSIS — M25531 Pain in right wrist: Secondary | ICD-10-CM | POA: Diagnosis not present

## 2021-09-09 DIAGNOSIS — M545 Low back pain, unspecified: Secondary | ICD-10-CM | POA: Diagnosis not present

## 2021-09-09 DIAGNOSIS — I69351 Hemiplegia and hemiparesis following cerebral infarction affecting right dominant side: Secondary | ICD-10-CM | POA: Insufficient documentation

## 2021-09-09 DIAGNOSIS — G8929 Other chronic pain: Secondary | ICD-10-CM | POA: Diagnosis not present

## 2021-09-09 DIAGNOSIS — R29818 Other symptoms and signs involving the nervous system: Secondary | ICD-10-CM | POA: Insufficient documentation

## 2021-09-09 NOTE — Therapy (Signed)
OUTPATIENT PHYSICAL THERAPY TREATMENT   Patient Name: Aaron Dennis MRN: [id number] DOB:[birth year], 66 y.o., male Today's Date: 09/09/2021   PT End of Session - 09/09/21 1009     Visit Number 13    Date for PT Re-Evaluation 10/06/21    Authorization Type [employer] Medicare    PT Start Time 1010    PT Stop Time 1100    PT Time Calculation (min) 50 min    Activity Tolerance Patient tolerated treatment well    Behavior During Therapy WFL for tasks assessed/performed                      Past Medical History:  Diagnosis Date   GERD (gastroesophageal reflux disease)    Hypertension    Lung nodule    7 mm LUL nodule   Past Surgical History:  Procedure Laterality Date   IR RADIOLOGIST EVAL & MGMT  07/02/2021   IR RADIOLOGIST EVAL & MGMT  07/22/2021   Patient Active Problem List   Diagnosis Date Noted   Aneurysm, cerebral, nonruptured 08/13/2021   Status post stroke due to cerebrovascular disease    Cerebral edema ([place]) 04/29/2021   Stroke (cerebrum) ([place]) 04/29/2021   Right sided weakness    Acute CVA (cerebrovascular accident) ([place]) 04/27/2021   Primary osteoarthritis of left knee 01/13/2019   Primary osteoarthritis of right knee 01/13/2019   Flat foot 11/28/2017   Tendonitis of foot 11/28/2017   Polycythemia 05/12/2017   Vitamin D deficiency 04/21/2017   Bony sclerosis 08/05/2016   Lung nodule    Chronic pain of both knees 12/05/2015   Gastroesophageal reflux disease without esophagitis 05/17/2014   Essential hypertension 05/17/2014   Coronary artery calcification 08/17/2012   Abnormal LFTs (liver function tests) 07/11/2012   Chronic abdominal pain 06/24/2012   Right-sided chest wall pain 06/24/2012   Constipation, chronic 06/24/2012   Hyperbilirubinemia 06/24/2012   Chronic back pain 06/24/2012   DDD (degenerative disc disease), thoracolumbar 06/24/2012   History of nephrolithiasis 06/24/2012   Side pain 06/24/2012   Low back pain at multiple sites  06/24/2012    PCP: [name]  REFERRING PROVIDER: [name]  REFERRING DIAG: M25.511  THERAPY DIAG:  Hemiplegia and hemiparesis following cerebral infarction affecting right dominant side (HCC)  Muscle weakness (generalized)  Other symptoms and signs involving the nervous system  Acute pain of right shoulder  Rationale for Evaluation and Treatment Rehabilitation  ONSET DATE: 07/15/21  SUBJECTIVE:  SUBJECTIVE STATEMENT: "Feeling good, no pain today"  PERTINENT HISTORY: Acute CVA 3/19  PAIN:  Are you having pain? No  PRECAUTIONS: Fall  WEIGHT BEARING RESTRICTIONS No  FALLS:  Has patient fallen in last 6 months? Yes. Number of falls 4  LIVING ENVIRONMENT: Lives with: lives with their family Lives in: House/apartment Stairs: Yes: External: 2 steps; none Has following equipment at home: Single point cane, Hemi walker, Wheelchair (manual), and Grab bars  OCCUPATION: retired  PLOF: [place] try to get stronger, try to use my legs and feet   OBJECTIVE:   DIAGNOSTIC FINDINGS:  Right shoulder 3 views: Shoulder is well located.  Subacromial space well-maintained.  Type II downsloping acromion.  No acute fractures or acute findings Right wrist 3 views: No acute fractures.  No subluxation dislocation of  the carpal bones.  Wrist joints well-maintained.  No bony abnormalities.  PATIENT SURVEYS:  FOTO 4  COGNITION:  Overall cognitive status: Within functional limits for tasks assessed     SENSATION: WFL  POSTURE: Decreased R shoulder height, hemiparesis, forward head, flexed trunk, R lateral lean  UPPER EXTREMITY ROM:   Passive ROM Right eval 08/28/21  Shoulder flexion 60d w/pain 62  W/pain  Shoulder extension    Shoulder abduction 80d w/pain  78 w/pain   Shoulder adduction    Shoulder internal rotation Full w/end range pain   Shoulder external rotation 40d w/pain 35 w/pain  Elbow flexion full   Elbow extension full   Wrist flexion    Wrist extension    Wrist ulnar deviation    Wrist radial deviation    Wrist pronation    Wrist supination    (Blank rows = not tested)  UPPER EXTREMITY MMT: L [facility]  MMT Right eval 08/28/21  Shoulder flexion 1 1  Shoulder extension    Shoulder abduction    Shoulder adduction 2- 2-  Shoulder internal rotation 2- 2  Shoulder external rotation 2- 2-  Middle trapezius    Lower trapezius    Elbow flexion 2+ 2+  Elbow extension 2- 2  Wrist flexion    Wrist extension    Wrist ulnar deviation    Wrist radial deviation    Wrist pronation    Wrist supination    Grip strength (lbs)    (Blank rows = not tested)  LE MMT 3/5 hip flexion, knee ext, knee flexion  Using hemiwalker, decrease R foot clearance, R foot ER, decrease hip flexion, decreased stance time on R LE, decreased step length, foot drop  SHOULDER SPECIAL TESTS:  Impingement tests: Neer impingement test: positive , Hawkins/Kennedy impingement test: positive , and Painful arc test: positive     Instability tests: Apprehension test: positive  and Sulcus sign: positive   JOINT MOBILITY TESTING:  Pain with PROM, muscle guarding, instability in R shoulder, scapular winging  PALPATION:  No TTP   FUNCTIONAL MEASURES Eval BERG 24/56 TUG 57.28s--slowed gait using hemiwalker, no foot clearance and decreased safety awareness esp with turns 5xSTS-- 25.72s, LUE push off, decreased control w/descending   TODAY'S TREATMENT:  09/09/21 NuStep L4 x 6 min  S2S 2x10 then UE assist x1 Standing march with hemi walker 2x10 Leg Press 20lb 2x10, Single leg 20lb x 5 Side steps front of mat table  4in step ups HHA x1 2x5 Gait 65f x 2 no AD cues to increase step lenght Standing hip abd & Ext 2lb x10 each   09/02/21 Nustep L5 x683ms Step ups 4" x10  on  LLE x5 on RLE  Walking w/o AD, HHA for speed  Side steps over obstacles 2 way hip 2# 2x10 bilat  Calf raises 2# 2x12  09/01/21 Nustep L5 x18mns  Obstacle course (step up 4", ladder, step up on airex) [facility] 2 laps around gym  Knee ext 5# x10, 10# RLE eccentric  Knee flexion 20# x10, 10# x10 RLE  Leg press   08/28/21 Reasses goals for progress note TUG 48.95s 5xSTS 20.35s Nustep L5x653ms Walking w/o AD 2 laps  Calf raises 2x10  08/26/21 Nustep L5 x6m27m Walking w/o AD 2 laps  STS from elevated mat table 2x10 Fitter 2x10  Bridges 2x10 SLR 2x10 RLE    08/25/21 Nustep L6 x5mi30mHS curls 20# 2x10, RLE only #10 x10 Knee ext 10# x10, eccentric x10, 5# RLE only x10  Step ups 4" Walking w/o AD in // bars forwards, backwards, side steps    08/21/21 Patient reports L lateral knee pain. His ITB is very tight. Therapist performed STM to ITB followed by stretch in supine with knee to opposite chest. Educated him to use tennis ball for further STM at home. Patient noted to overuse L side with all mobility, demosntrates decreased WB through RLE at all times, probably due to sensory changes and weakness with patient not trusting RLE. Educated him to the need to incorporate RLE into all mobility. Therapist first facilitated lower trunk activation and mobilization sitting on physiodisc-performed ant/post, Lat, and then circular lower trunk motions, required min-mod TC for correct technique. Moved sit to stand, educating patient with VC and tactile facilitation to remain centered with equal WB through both legs. Facilitated RLE activation. Performed repeated sit to stand, required min-mod facilit  08/19/21 NM re-ed for R pelvis and shoulder girdle-Sidelie, R pelvic mobility-ant/post, sup/inf, and into diagonals, min TC and VC for correct techqniue and isolated movement. He then mobilized R scapula in same planes, with more limited movement, moved to active shoulder  protraction and retraction and active hip abd with forward and back swing to engage motor stability. Moved to sit and continued to perform reaching activities to engage R scapula and shoulder.He then moved to steps and performed step ups x 10 with RLE on 4" step, min A to facilitate correct weight shift and active RLE control.  Ambulated without AD, CGA, VC to increase strep length, no unsteadiness noted.   PATIENT EDUCATION: Education details: POC Person educated: Patient and brother Education method: ExplCustomer service managercation comprehension: verbalized understanding   HOME EXERCISE PROGRAM: TBD   ASSESSMENT:  CLINICAL IMPRESSION:   Pt enters doing well despite reporting being sick last week. Session focused on a mixture of functional strength and gait. Decrease R knee TKE noted throughout session.  Difficulty with single leg on leg press. Cue needed to increase step length with gait.    OBJECTIVE IMPAIRMENTS Abnormal gait, decreased mobility, difficulty walking, decreased ROM, decreased strength, decreased safety awareness, and pain.   ACTIVITY LIMITATIONS carrying, lifting, transfers, bathing, toileting, dressing, reach over head, and locomotion level  PARTICIPATION LIMITATIONS: cleaning, laundry, driving, community activity, and yard work  PERSONAL FACTORS Age, Time since onset of injury/illness/exacerbation, and 3+ comorbidities: chronic pain, acute CVA, stroke, OA  are also affecting patient's functional outcome.   REHAB POTENTIAL: Fair due to time since stroke and increased deficits on R side  CLINICAL DECISION MAKING: Evolving/moderate complexity  EVALUATION COMPLEXITY: Moderate   GOALS: Goals reviewed with patient? No  SHORT TERM GOALS: Target date: 09/08/21  Patient will be independent with initial HEP.  Goal status: ongoing  2.  Patient will be educated on strategies to decrease risk of falls.  Goal status: ongoing   LONG TERM GOALS: Target date:  10/06/21  1.  Patient will report 75% improvement in R shoulder pain to improve QOL.  Goal status: IN PROGRESS  2.  Patient to demonstrate improved upright posture with posterior shoulder girdle engaged to promote improved glenohumeral joint mobility. Baseline: decreased shoulder height on R side, decreased shoulder stability, scapular winging  Goal status: ongoing  3.  Patient to improve R shoulder AROM to  within patient's functional norm  without pain provocation to allow for increased ease of ADLs.  Baseline: measured w/PROM, minimal AROM, PROM taken 7/20 Goal status: IN PROGRESS  4.  Patient will demonstrate improved functional UE and LE strength as demonstrated by >= 4/5 in all muscle groups . Goal status: IN PROGRESS  5.  Patient will report 57 on FOTO(patient outcome measure)  to demonstrate improved functional ability.  Baseline: 4, 28 Goal status: IN PROGRESS  6. Patient will score 34 or greater on Berg Balance test to demonstrate lower risk of falls. (MCID= 8 points) .  Baseline: 24, 30  Goal status: ongoing  7. Patient will demonstrate decreased fall risk by scoring < 25 sec on TUG and <20 on 5xSTS. Baseline: 57.28s, 25.72s, 7/20-21.35s, 48.95s Goal status: ongoing     PLAN: PT FREQUENCY: 3x/week  PT DURATION: 8 weeks  PLANNED INTERVENTIONS: Therapeutic exercises, Therapeutic activity, Neuromuscular re-education, Balance training, Gait training, Patient/Family education, Joint mobilization, Stair training, Orthotic/Fit training, and Manual therapy  PLAN FOR NEXT SESSION: NM re-ed, gait training   Andris Baumann, DPT 09/09/21 10:09 AM

## 2021-09-09 NOTE — Therapy (Signed)
OUTPATIENT OCCUPATIONAL THERAPY TREATMENT NOTE  Patient Name: Aaron Dennis MRN: [id number] DOB:[birth year], 66 y.o., male Today's Date: 09/09/2021  PCP: Charlott Rakes, MD REFERRING PROVIDER: Pete Pelt, PA-C   OT End of Session - 09/09/21 1101     Visit Number 13    Number of Visits 25    Date for OT Re-Evaluation 10/03/21    Authorization Type UHC Medicare; Medicaid [place] (secondary)    Authorization Time Period VL: [place]    Progress Note Due on Visit 28    OT Start Time 1100    OT Stop Time 1142    OT Time Calculation (min) 42 min    Activity Tolerance Patient tolerated treatment well    Behavior During Therapy WFL for tasks assessed/performed            Past Medical History:  Diagnosis Date   GERD (gastroesophageal reflux disease)    Hypertension    Lung nodule    7 mm LUL nodule   Past Surgical History:  Procedure Laterality Date   IR RADIOLOGIST EVAL & MGMT  07/02/2021   IR RADIOLOGIST EVAL & MGMT  07/22/2021   Patient Active Problem List   Diagnosis Date Noted   Aneurysm, cerebral, nonruptured 08/13/2021   Status post stroke due to cerebrovascular disease    Cerebral edema ([place]) 04/29/2021   Stroke (cerebrum) ([place]) 04/29/2021   Right sided weakness    Acute CVA (cerebrovascular accident) ([place]) 04/27/2021   Primary osteoarthritis of left knee 01/13/2019   Primary osteoarthritis of right knee 01/13/2019   Flat foot 11/28/2017   Tendonitis of foot 11/28/2017   Polycythemia 05/12/2017   Vitamin D deficiency 04/21/2017   Bony sclerosis 08/05/2016   Lung nodule    Chronic pain of both knees 12/05/2015   Gastroesophageal reflux disease without esophagitis 05/17/2014   Essential hypertension 05/17/2014   Coronary artery calcification 08/17/2012   Abnormal LFTs (liver function tests) 07/11/2012   Chronic abdominal pain 06/24/2012   Right-sided chest wall pain 06/24/2012   Constipation, chronic 06/24/2012   Hyperbilirubinemia 06/24/2012   Chronic back pain  06/24/2012   DDD (degenerative disc disease), thoracolumbar 06/24/2012   History of nephrolithiasis 06/24/2012   Side pain 06/24/2012   Low back pain at multiple sites 06/24/2012    ONSET DATE: 07/17/21 (date of OT order)  REFERRING DIAG: M25.531 (ICD-10-CM) - Pain in right wrist M25.511 (ICD-10-CM) - Acute pain of right shoulder   THERAPY DIAG:  Hemiplegia and hemiparesis following cerebral infarction affecting right dominant side (HCC)  Acute pain of right shoulder  Pain in right wrist  Muscle weakness (generalized)  Other symptoms and signs involving the nervous system  Rationale for Evaluation and Treatment Rehabilitation  SUBJECTIVE:   SUBJECTIVE STATEMENT: "It's really bad" (referring to shoulder pain); pt reports he has not been completing exercises at home because he has not been feeling well Pt accompanied by: self and family member (brother, Selinda Michaels, drove pt to clinic)  PAIN: Are you having pain? Yes: NPRS scale: 5/10 Pain location: R shoulder; worse around scapular Pain description: "it feels heavy" Aggravating factors: movement Relieving factors: hand movement; massage; topical ointment  PERTINENT HISTORY: L frontoparietal CVA w/ residual R-sided weakness, presented to ED 04/27/21 after recent prolonged travel; PMH includes h/o polycythemia, HTN  PRECAUTIONS: Fall  PLOF: Independent and Vocation/Vocational requirements: retired Architect  PATIENT GOALS "Work on my shoulder"   OBJECTIVE:   ------------------------------------------------------------------------------------------------------------------------------------------------------  UPPER EXTREMITY ROM: LUE WFL; RUE measurements below  Active ROM Right  Eval - 6/26 Right 7/20  Shoulder flexion Unable 30%  Shoulder abduction Unable 40%  Shoulder adduction WFL   Shoulder extension 24   Shoulder internal rotation [facility]   Shoulder external rotation Unable Unable due to pain  Elbow flexion WFL    Elbow extension 33 90%  Wrist flexion 27 80%  Wrist extension 28   Wrist pronation To be assessed   Wrist supination To be assessed   (Blank rows = not tested)  UPPER EXTREMITY MMT:     MMT Right Eval - 6/26 Right 7/20  Shoulder flexion 1/5 2-/5  Shoulder abduction 1/5 2+/5  Shoulder extension 3-/5   Shoulder internal rotation 2/5   Shoulder external rotation 2-/5   Elbow flexion 3/5 3/5  Elbow extension 2-/5 2+/5  Wrist flexion 2-/5 2+/5  Wrist extension 2-/5 2+/5  Wrist pronation 2+/5 2+/5  Wrist supination 2+/5 2+/5  (Blank rows = not tested)  ------------------------------------------------------------------------------------------------------------------------------------------------------  TODAY'S TREATMENT - 09/09/21:  NMR of RUE Towel slides w/ RUE for shoulder ER x15 slowly, returning to IR starting position each rep. OT provided consistent verbal and tactile cues to decrease compensatory trunk movements/arm abduction; OT also incorporated active-assisted movement toward end range for increased arc of motion w/ ER  Closed-chain AAROM of BUE shoulder flex/elbow extension w/ short, unweighted dowel while sitting upright in chair w/ backrest. OT provided mod verbal cues to decrease compensatory shoulder hiking and incorporated visual aid (mirror) in front of pt for self-correction. Increased arc of motion, able to achieve eye level, w/ increased reps  BUE rows while sitting upright w/ emphasis on scapular retraction; first attempted against light resistance, but pt was unable to tolerate due to pain. After short rest break, OT repositioned pt to upright posture and attempted rows again, but w/out resistance while manually facilitating scapular retraction/depression each rep; able to complete x15 w/out increased pain  Shoulder ER OT facilitated PROM of R shoulder ER while in supine to facilitate scapular alignment w/ retraction, incorporating stretch held at end range to  increase ROM and decrease joint stiffness/impact of spasticity. Heat pack positioned under R shoulder blade for comfort and pain management. OT provided occ manual facilitation and verbal cues to relax RUE prn; able to complete w/ discomfort improving w/ reptition. Increased tone noted around 30 deg of ER w/ OT able to achieve 63 deg of PROM after 10 slow reps.    PATIENT EDUCATION: Ongoing condition-specific education related to therapeutic interventions completed this session and persistent shoulder pain Person educated: Patient Education method: Explanation Education comprehension: verbalized understanding   HOME EXERCISE PROGRAM: Putty Exercises (see pt instructions) MedBridge Code: [id number] - Seated Shoulder Shrugs  - 3 x daily - 7 x weekly - 1 sets - 15 reps - Seated Shoulder Shrug Circles AROM Backward  - 3 x daily - 7 x weekly - 1 sets - 15 reps - Seated Scapular Retraction  - 3 x daily - 7 x weekly - 1 sets - 15 reps - Seated Shoulder External Rotation AAROM with Dowel  - 3 x daily - 7 x weekly - 1 sets - 10 reps - Supine Shoulder Flexion AAROM with Hands Clasped  - 3 x daily - 7 x weekly - 1 sets - 10 reps - Seated Elbow Flexion and Extension AROM  - 3 x daily - 7 x weekly - 2 sets - 10 reps - Seated Forearm Pronation and Supination AROM  - 3 x daily - 7 x weekly - 2 sets -  10 reps - Wrist AROM Flexion Extension  - 3 x daily - 7 x weekly - 2 sets - 10 reps   GOALS: Goals reviewed with patient? Yes  SHORT TERM GOALS: Target date: 09/01/21  STG  Status:  1 Pt will demonstrate understanding of initial HEP designed for RUE ROM Baseline: No HEP at this time Met - 09/02/21  2 Pt to improve R shoulder flexion strength to at least 2-/5 (partial ROM, gravity min) Baseline: shoulder flex MMT: 1/5 Met - 08/25/21  3 Pt will be able to grasp and lift lightweight object to mouth w/ R, dominant UE in at least 2 trials Baseline: Significantly limited gross grasp Met - 08/25/21  4 Pt will  achieve at least 50% of R wrist extension AROM against gravity Baseline: partial ROM in horizontal plane Progressing     LONG TERM GOALS: Target date: 10/03/21  LTG  Status:  1 Pt will improve FOTO score to at least 45 to indicate improved participation in functional activities Baseline: Intake - 4 Progressing  2 Pt will be able to achieve at least partial AROM of R shoulder flexion against gravity by discharge for improved functional use of RUE Baseline: shoulder flex MMT: 1/5 Progressing  3 Pt will be able to complete at least 10 blocks during Box and Blocks Test to indicated improved unilateral GMC of R, dominant UE Baseline: Unable at time of evaluation Progressing  4 Pt will be able to demonstrate incorporation of RUE as gross assist in at least 1 functional task (pulling up pants, stabilizing plate, etc.) Baseline: Not incorporating LUE at gross level at time of eval Progressing     ASSESSMENT:  CLINICAL IMPRESSION: Pt continues to report persistent shoulder pain around rhomboids on R side and lateral aspect of upper arm. Pt has a follow-up w/ ortho on Monday 8/7 that was rescheduled from last week due to ER visit for constipation. Shoulder ER continues to be extremely limited, w/ pt unable to achieve neutral position w/out pain. Pain currently appears to be related to hemiplegic shoulder pain due to noted Sch[place]ield subluxation; poor posture w/ forward rounded shoulders; muscle imbalance impingement, particularly w/ mobility; and increased tone and spasticity. OT has provided consistent education in this area w/ pt continuing to demonstrate difficulty understanding likely role of these factors in shoulder pain. However, w/ improved emphasis on scapular alignment, particularly in supine, OT able to achieve increased ER w/out increased pain. Considering persistent limitations in therapy due to shoulder pain, OT recommends pt cancel appt this Thursday 8/3 and return after follow-up w/ MD; pt  verbalized understanding.  PERFORMANCE DEFICITS in functional skills including ADLs, IADLs, coordination, dexterity, proprioception, sensation, tone, ROM, strength, pain, FMC, GMC, mobility, balance, body mechanics, decreased knowledge of use of DME, and UE functional use, cognitive skills including safety awareness, and psychosocial skills including environmental adaptation.   IMPAIRMENTS are limiting patient from ADLs, IADLs, rest and sleep, and work.   COMORBIDITIES may have co-morbidities  that affects occupational performance. Patient will benefit from skilled OT to address above impairments and improve overall function.   PLAN: OT FREQUENCY: 3x/week  OT DURATION: 8 weeks  PLANNED INTERVENTIONS: self care/ADL training, therapeutic exercise, therapeutic activity, neuromuscular re-education, manual therapy, passive range of motion, balance training, functional mobility training, aquatic therapy, splinting, electrical stimulation, ultrasound, iontophoresis, moist heat, cryotherapy, patient/family education, and DME and/or AE instructions  RECOMMENDED OTHER SERVICES: Currently receiving PT services at this location  CONSULTED AND AGREED WITH PLAN OF CARE: Patient  and family member/caregiver  PLAN FOR NEXT SESSION: Continue to target RUE GM and [place], coordination, and strength; wb and somatosensory input for [place], MSOT, OTR/L 09/09/2021, 12:17 PM

## 2021-09-10 ENCOUNTER — Other Ambulatory Visit: Payer: Self-pay

## 2021-09-11 ENCOUNTER — Ambulatory Visit: Payer: Medicare Other | Admitting: Physical Therapy

## 2021-09-11 ENCOUNTER — Encounter: Payer: Self-pay | Admitting: Occupational Therapy

## 2021-09-11 ENCOUNTER — Ambulatory Visit: Payer: Medicare Other | Admitting: Occupational Therapy

## 2021-09-11 ENCOUNTER — Encounter: Payer: Self-pay | Admitting: Physical Therapy

## 2021-09-11 DIAGNOSIS — I69351 Hemiplegia and hemiparesis following cerebral infarction affecting right dominant side: Secondary | ICD-10-CM | POA: Diagnosis not present

## 2021-09-11 DIAGNOSIS — M6281 Muscle weakness (generalized): Secondary | ICD-10-CM

## 2021-09-11 DIAGNOSIS — R29818 Other symptoms and signs involving the nervous system: Secondary | ICD-10-CM | POA: Diagnosis not present

## 2021-09-11 DIAGNOSIS — M25531 Pain in right wrist: Secondary | ICD-10-CM

## 2021-09-11 DIAGNOSIS — M25511 Pain in right shoulder: Secondary | ICD-10-CM | POA: Diagnosis not present

## 2021-09-11 DIAGNOSIS — R2689 Other abnormalities of gait and mobility: Secondary | ICD-10-CM | POA: Diagnosis not present

## 2021-09-11 DIAGNOSIS — R293 Abnormal posture: Secondary | ICD-10-CM

## 2021-09-11 DIAGNOSIS — M545 Low back pain, unspecified: Secondary | ICD-10-CM

## 2021-09-11 DIAGNOSIS — Z8673 Personal history of transient ischemic attack (TIA), and cerebral infarction without residual deficits: Secondary | ICD-10-CM | POA: Diagnosis not present

## 2021-09-11 DIAGNOSIS — G8929 Other chronic pain: Secondary | ICD-10-CM | POA: Diagnosis not present

## 2021-09-11 DIAGNOSIS — I63412 Cerebral infarction due to embolism of left middle cerebral artery: Secondary | ICD-10-CM | POA: Diagnosis not present

## 2021-09-11 NOTE — Therapy (Signed)
OUTPATIENT PHYSICAL THERAPY TREATMENT   Patient Name: Aaron Dennis MRN: [id number] DOB:10-03-1955, 66 y.o., male Today's Date: 09/11/2021   PT End of Session - 09/11/21 0806     Visit Number 14    Date for PT Re-Evaluation 10/06/21    Authorization Type UHC Medicare    PT Start Time 0254    PT Stop Time 0840    PT Time Calculation (min) 41 min    Activity Tolerance Patient tolerated treatment well    Behavior During Therapy WFL for tasks assessed/performed                       Past Medical History:  Diagnosis Date   GERD (gastroesophageal reflux disease)    Hypertension    Lung nodule    7 mm LUL nodule   Past Surgical History:  Procedure Laterality Date   IR RADIOLOGIST EVAL & MGMT  07/02/2021   IR RADIOLOGIST EVAL & MGMT  07/22/2021   Patient Active Problem List   Diagnosis Date Noted   Aneurysm, cerebral, nonruptured 08/13/2021   Status post stroke due to cerebrovascular disease    Cerebral edema ([place]) 04/29/2021   Stroke (cerebrum) ([place]) 04/29/2021   Right sided weakness    Acute CVA (cerebrovascular accident) ([place]) 04/27/2021   Primary osteoarthritis of left knee 01/13/2019   Primary osteoarthritis of right knee 01/13/2019   Flat foot 11/28/2017   Tendonitis of foot 11/28/2017   Polycythemia 05/12/2017   Vitamin D deficiency 04/21/2017   Bony sclerosis 08/05/2016   Lung nodule    Chronic pain of both knees 12/05/2015   Gastroesophageal reflux disease without esophagitis 05/17/2014   Essential hypertension 05/17/2014   Coronary artery calcification 08/17/2012   Abnormal LFTs (liver function tests) 07/11/2012   Chronic abdominal pain 06/24/2012   Right-sided chest wall pain 06/24/2012   Constipation, chronic 06/24/2012   Hyperbilirubinemia 06/24/2012   Chronic back pain 06/24/2012   DDD (degenerative disc disease), thoracolumbar 06/24/2012   History of nephrolithiasis 06/24/2012   Side pain 06/24/2012   Low back pain at multiple sites  06/24/2012    PCP: [name]  REFERRING PROVIDER: [name]  REFERRING DIAG: M25.511  THERAPY DIAG:  Hemiplegia and hemiparesis following cerebral infarction affecting right dominant side (HCC)  Muscle weakness (generalized)  Other abnormalities of gait and mobility  Chronic bilateral low back pain without sciatica  Abnormal posture  Rationale for Evaluation and Treatment Rehabilitation  ONSET DATE: 07/15/21  SUBJECTIVE:  SUBJECTIVE STATEMENT: Patient reports "normal" pain in his R shoulder, no other issues.  PERTINENT HISTORY: Acute CVA 3/19  PAIN:  Are you having pain? No  PRECAUTIONS: Fall  WEIGHT BEARING RESTRICTIONS No  FALLS:  Has patient fallen in last 6 months? Yes. Number of falls 4  LIVING ENVIRONMENT: Lives with: lives with their family Lives in: House/apartment Stairs: Yes: External: 2 steps; none Has following equipment at home: Single point cane, Hemi walker, Wheelchair (manual), and Grab bars  OCCUPATION: retired  PLOF: [place] try to get stronger, try to use my legs and feet   OBJECTIVE:   DIAGNOSTIC FINDINGS:  Right shoulder 3 views: Shoulder is well located.  Subacromial space well-maintained.  Type II downsloping acromion.  No acute fractures or acute findings Right wrist 3 views: No acute fractures.  No subluxation dislocation of  the carpal bones.  Wrist joints well-maintained.  No bony abnormalities.  PATIENT SURVEYS:  FOTO 4  COGNITION:  Overall cognitive status: Within functional limits for tasks assessed     SENSATION: WFL  POSTURE: Decreased R shoulder height, hemiparesis, forward head, flexed trunk, R lateral lean  UPPER EXTREMITY ROM:   Passive ROM Right eval 08/28/21  Shoulder flexion 60d w/pain 62  W/pain   Shoulder extension    Shoulder abduction 80d w/pain  78 w/pain  Shoulder adduction    Shoulder internal rotation Full w/end range pain   Shoulder external rotation 40d w/pain 35 w/pain  Elbow flexion full   Elbow extension full   Wrist flexion    Wrist extension    Wrist ulnar deviation    Wrist radial deviation    Wrist pronation    Wrist supination    (Blank rows = not tested)  UPPER EXTREMITY MMT: L [facility]  MMT Right eval 08/28/21  Shoulder flexion 1 1  Shoulder extension    Shoulder abduction    Shoulder adduction 2- 2-  Shoulder internal rotation 2- 2  Shoulder external rotation 2- 2-  Middle trapezius    Lower trapezius    Elbow flexion 2+ 2+  Elbow extension 2- 2  Wrist flexion    Wrist extension    Wrist ulnar deviation    Wrist radial deviation    Wrist pronation    Wrist supination    Grip strength (lbs)    (Blank rows = not tested)  LE MMT 3/5 hip flexion, knee ext, knee flexion  Using hemiwalker, decrease R foot clearance, R foot ER, decrease hip flexion, decreased stance time on R LE, decreased step length, foot drop  SHOULDER SPECIAL TESTS:  Impingement tests: Neer impingement test: positive , Hawkins/Kennedy impingement test: positive , and Painful arc test: positive     Instability tests: Apprehension test: positive  and Sulcus sign: positive   JOINT MOBILITY TESTING:  Pain with PROM, muscle guarding, instability in R shoulder, scapular winging  PALPATION:  No TTP   FUNCTIONAL MEASURES Eval BERG 24/56 TUG 57.28s--slowed gait using hemiwalker, no foot clearance and decreased safety awareness esp with turns 5xSTS-- 25.72s, LUE push off, decreased control w/descending   TODAY'S TREATMENT:  09/11/21 Recumbent bike L3.5 x 5 minute, then 1 additional minute fast RPM Supine with R foot planted on red Physioball, push through RLE into ext against resistance, then hold the ext while lifting LLE. Good control demonstrated. SLS with cane in LUE,  encouraged to stand on RLE and rotate hips, therapist facilitated RLE WB with good control. Progressed to crossover steps in each direction,  therapist facilitated hip rotation and weight shift onto RLE.  Quick steps in all directions upon command of therapist. Patient required Mod VC to move quickly Gait training with straight cane, 2 x 100' with S and VC for increased speed-Patient demonstrates improved/normalized gait pattern when he walks at a normal speed.  09/09/21 NuStep L4 x 6 min  S2S 2x10 then UE assist x1 Standing march with hemi walker 2x10 Leg Press 20lb 2x10, Single leg 20lb x 5 Side steps front of mat table  4in step ups HHA x1 2x5 Gait 63f x 2 no AD cues to increase step lenght Standing hip abd & Ext 2lb x10 each   09/02/21 Nustep L5 x653ms Step ups 4" x10 on LLE x5 on RLE  Walking w/o AD, HHA for speed  Side steps over obstacles 2 way hip 2# 2x10 bilat  Calf raises 2# 2x12  09/01/21 Nustep L5 x6m88m  Obstacle course (step up 4", ladder, step up on airex) Walking [facility] speed 2 laps around gym  Knee ext 5# x10, 10# RLE eccentric  Knee flexion 20# x10, 10# x10 RLE  Leg press   08/28/21 Reasses goals for progress note TUG 48.95s 5xSTS 20.35s Nustep L5x6mi21mWalking w/o AD 2 laps  Calf raises 2x10  08/26/21 Nustep L5 x6min25malking w/o AD 2 laps  STS from elevated mat table 2x10 [place] 2x10  Bridges 2x10 SLR 2x10 RLE    08/25/21 Nustep L6 x5mins23m curls 20# 2x10, RLE only #10 x10 Knee ext 10# x10, eccentric x10, 5# RLE only x10  Step ups 4" Walking w/o AD in // bars forwards, backwards, side steps    08/21/21 Patient reports L lateral knee pain. His ITB is very tight. Therapist performed STM to ITB followed by stretch in supine with knee to opposite chest. Educated him to use tennis ball for further STM at home. Patient noted to overuse L side with all mobility, demosntrates decreased WB through RLE at all times, probably due to sensory changes  and weakness with patient not trusting RLE. Educated him to the need to incorporate RLE into all mobility. Therapist first facilitated lower trunk activation and mobilization sitting on physiodisc-performed ant/post, Lat, and then circular lower trunk motions, required min-mod TC for correct technique. Moved sit to stand, educating patient with VC and tactile facilitation to remain centered with equal WB through both legs. Facilitated RLE activation. Performed repeated sit to stand, required min-mod facilit  08/19/21 NM re-ed for R pelvis and shoulder girdle-Sidelie, R pelvic mobility-ant/post, sup/inf, and into diagonals, min TC and VC for correct techqniue and isolated movement. He then mobilized R scapula in same planes, with more limited movement, moved to active shoulder protraction and retraction and active hip abd with forward and back swing to engage motor stability. Moved to sit and continued to perform reaching activities to engage R scapula and shoulder.He then moved to steps and performed step ups x 10 with RLE on 4" step, min A to facilitate correct weight shift and active RLE control.  Ambulated without AD, CGA, VC to increase strep length, no unsteadiness noted.   PATIENT EDUCATION: Education details: POC Person educated: Patient and brother Education method: ExplanCustomer service managertion comprehension: verbalized understanding   HOME EXERCISE PROGRAM: TBD   ASSESSMENT:  CLINICAL IMPRESSION:   Pt enters doing well despite reporting being sick last week. Session focused on a mixture of functional strength and gait. Decrease R knee TKE noted throughout session.  Difficulty with single leg  on leg press. Cue needed to increase step length with gait.    OBJECTIVE IMPAIRMENTS Abnormal gait, decreased mobility, difficulty walking, decreased ROM, decreased strength, decreased safety awareness, and pain.   ACTIVITY LIMITATIONS carrying, lifting, transfers, bathing, toileting,  dressing, reach over head, and locomotion level  PARTICIPATION LIMITATIONS: cleaning, laundry, driving, community activity, and yard work  PERSONAL FACTORS Age, Time since onset of injury/illness/exacerbation, and 3+ comorbidities: chronic pain, acute CVA, stroke, OA  are also affecting patient's functional outcome.   REHAB POTENTIAL: Fair due to time since stroke and increased deficits on R side  CLINICAL DECISION MAKING: Evolving/moderate complexity  EVALUATION COMPLEXITY: Moderate   GOALS: Goals reviewed with patient? No  SHORT TERM GOALS: Target date: 09/08/21 Patient will be independent with initial HEP.  Goal status: ongoing  2.  Patient will be educated on strategies to decrease risk of falls.  Goal status: ongoing   LONG TERM GOALS: Target date: 10/06/21  1.  Patient will report 75% improvement in R shoulder pain to improve QOL.  Goal status: IN PROGRESS  2.  Patient to demonstrate improved upright posture with posterior shoulder girdle engaged to promote improved glenohumeral joint mobility. Baseline: decreased shoulder height on R side, decreased shoulder stability, scapular winging  Goal status: ongoing  3.  Patient to improve R shoulder AROM to  within patient's functional norm  without pain provocation to allow for increased ease of ADLs.  Baseline: measured w/PROM, minimal AROM, PROM taken 7/20 Goal status: IN PROGRESS  4.  Patient will demonstrate improved functional UE and LE strength as demonstrated by >= 4/5 in all muscle groups . Goal status: IN PROGRESS  5.  Patient will report 34 on FOTO(patient outcome measure)  to demonstrate improved functional ability.  Baseline: 4, 28 Goal status: IN PROGRESS  6. Patient will score 34 or greater on Berg Balance test to demonstrate lower risk of falls. (MCID= 8 points) .  Baseline: 24, 30  Goal status: ongoing  7. Patient will demonstrate decreased fall risk by scoring < 25 sec on TUG and <20 on 5xSTS. Baseline:  57.28s, 25.72s, 7/20-21.35s, 48.95s Goal status: ongoing     PLAN: PT FREQUENCY: 3x/week  PT DURATION: 8 weeks  PLANNED INTERVENTIONS: Therapeutic exercises, Therapeutic activity, Neuromuscular re-education, Balance training, Gait training, Patient/Family education, Joint mobilization, Stair training, Orthotic/Fit training, and Manual therapy  PLAN FOR NEXT SESSION: NM re-ed, gait training  Ethel Rana DPT 09/11/21 11:00 AM  09/11/21 11:00 AM

## 2021-09-11 NOTE — Therapy (Signed)
OUTPATIENT OCCUPATIONAL THERAPY TREATMENT NOTE  Patient Name: Aaron Dennis MRN: [id number] DOB:November 12, 1955, 66 y.o., male Today's Date: 09/11/2021  PCP: Charlott Rakes, MD REFERRING PROVIDER: Pete Pelt, PA-C   OT End of Session - 09/11/21 0849     Visit Number 14    Number of Visits 25    Date for OT Re-Evaluation 10/03/21    Authorization Type UHC Medicare; Medicaid [place] (secondary)    Authorization Time Period VL: [place]    Progress Note Due on Visit 35    OT Start Time 0847    OT Stop Time 0927    OT Time Calculation (min) 40 min    Activity Tolerance Patient tolerated treatment well    Behavior During Therapy [facility] for tasks assessed/performed            Past Medical History:  Diagnosis Date   GERD (gastroesophageal reflux disease)    Hypertension    Lung nodule    7 mm LUL nodule   Past Surgical History:  Procedure Laterality Date   IR RADIOLOGIST EVAL & MGMT  07/02/2021   IR RADIOLOGIST EVAL & MGMT  07/22/2021   Patient Active Problem List   Diagnosis Date Noted   Aneurysm, cerebral, nonruptured 08/13/2021   Status post stroke due to cerebrovascular disease    Cerebral edema ([place]) 04/29/2021   Stroke (cerebrum) ([place]) 04/29/2021   Right sided weakness    Acute CVA (cerebrovascular accident) ([place]) 04/27/2021   Primary osteoarthritis of left knee 01/13/2019   Primary osteoarthritis of right knee 01/13/2019   Flat foot 11/28/2017   Tendonitis of foot 11/28/2017   Polycythemia 05/12/2017   Vitamin D deficiency 04/21/2017   Bony sclerosis 08/05/2016   Lung nodule    Chronic pain of both knees 12/05/2015   Gastroesophageal reflux disease without esophagitis 05/17/2014   Essential hypertension 05/17/2014   Coronary artery calcification 08/17/2012   Abnormal LFTs (liver function tests) 07/11/2012   Chronic abdominal pain 06/24/2012   Right-sided chest wall pain 06/24/2012   Constipation, chronic 06/24/2012   Hyperbilirubinemia 06/24/2012   Chronic back pain  06/24/2012   DDD (degenerative disc disease), thoracolumbar 06/24/2012   History of nephrolithiasis 06/24/2012   Side pain 06/24/2012   Low back pain at multiple sites 06/24/2012    ONSET DATE: 07/17/21 (date of OT order)  REFERRING DIAG: M25.531 (ICD-10-CM) - Pain in right wrist M25.511 (ICD-10-CM) - Acute pain of right shoulder   THERAPY DIAG:  Hemiplegia and hemiparesis following cerebral infarction affecting right dominant side (HCC)  Acute pain of right shoulder  Pain in right wrist  Muscle weakness (generalized)  Other symptoms and signs involving the nervous system  Rationale for Evaluation and Treatment Rehabilitation  SUBJECTIVE:   SUBJECTIVE STATEMENT: Pt reports he did not cancel his OT appointment today because "they asked me at the front if I still wanted to come" Pt accompanied by: self and family member (brother, Selinda Michaels, drove pt to clinic)  PAIN: Are you having pain? Yes: NPRS scale: 5/10 Pain location: R shoulder; worse around scapular Pain description: "it feels heavy" Aggravating factors: movement Relieving factors: hand movement; massage; topical ointment  PERTINENT HISTORY: L frontoparietal CVA w/ residual R-sided weakness, presented to ED 04/27/21 after recent prolonged travel; PMH includes h/o polycythemia, HTN  PRECAUTIONS: Fall  PLOF: Independent and Vocation/Vocational requirements: retired Architect  PATIENT GOALS "Work on my shoulder"   OBJECTIVE:   ------------------------------------------------------------------------------------------------------------------------------------------------------  UPPER EXTREMITY ROM: LUE WFL; RUE measurements below  Active ROM Right Eval -  6/26 Right 7/20  Shoulder flexion Unable 30%  Shoulder abduction Unable 40%  Shoulder adduction WFL   Shoulder extension 24   Shoulder internal rotation [facility]   Shoulder external rotation Unable Unable due to pain  Elbow flexion WFL   Elbow extension 33 90%   Wrist flexion 27 80%  Wrist extension 28   Wrist pronation To be assessed   Wrist supination To be assessed   (Blank rows = not tested)  UPPER EXTREMITY MMT:     MMT Right Eval - 6/26 Right 7/20  Shoulder flexion 1/5 2-/5  Shoulder abduction 1/5 2+/5  Shoulder extension 3-/5   Shoulder internal rotation 2/5   Shoulder external rotation 2-/5   Elbow flexion 3/5 3/5  Elbow extension 2-/5 2+/5  Wrist flexion 2-/5 2+/5  Wrist extension 2-/5 2+/5  Wrist pronation 2+/5 2+/5  Wrist supination 2+/5 2+/5  (Blank rows = not tested)  ------------------------------------------------------------------------------------------------------------------------------------------------------  TODAY'S TREATMENT - 09/11/21:  NMR of RUE R shoulder flex/ext and IR/ER completed w/ UE Ranger for NMR of GMC and shoulder stability. OT provided mod verbal cues to decrease compensatory trunk flexion and R shoulder hiking and occ tactile cues during 1st set of shoulder flex. Consistent tactile cue at L shoulder throughout ER w/ towel roll positioned under upper arm. Completed 2x15 each w/ good carryover of positioning between sets.  Elbow flex/ext holding 1 lb dumbbell while sitting upright w/ OT providing consistent verbal cues/demonstration to maintain upright positioning; able to achieve about 75% of flexion and 90% of extension. OT provided additional cues for full extension each rep. Completed 1 set of 10; d/c 2nd set due to R shoulder pain  Scapular Stability Scapular mobilization for retraction/depression w/ pt in side-lying on L side and RUE resting on tabletop to facilitate decreased pain, improved shoulder ROM, and enhance overall function of RUE. Pt able to tolerate w/out discomfort.  Then attempted shoulder forward flex in side-lying w/ pt unable to tolerate due to pain during extension back to starting position. OT repositioned pt to supine for scapular stability and focused on ROM of R shoulder ER,  completing PROM w/ stretch held at end range to increase ROM and decrease stiffness/impact of spasticity. OT provided occ verbal cues to relax RUE prn; able to complete w/ discomfort decreasing w/ reptition. Increased tone noted slightly past neutral position. Completed AROM of R shoulder ER 15x immediately following PROM w/ pt able to achieve about 40% of arc of motion w/out increased pain.    PATIENT EDUCATION: Ongoing condition-specific education related to therapeutic interventions completed this session and persistent shoulder pain Person educated: Patient Education method: Explanation Education comprehension: verbalized understanding   HOME EXERCISE PROGRAM: Putty Exercises (see pt instructions) MedBridge Code: [id number] - Seated Shoulder Shrugs  - 3 x daily - 7 x weekly - 1 sets - 15 reps - Seated Shoulder Shrug Circles AROM Backward  - 3 x daily - 7 x weekly - 1 sets - 15 reps - Seated Scapular Retraction  - 3 x daily - 7 x weekly - 1 sets - 15 reps - Seated Shoulder External Rotation AAROM with Dowel  - 3 x daily - 7 x weekly - 1 sets - 10 reps - Supine Shoulder Flexion AAROM with Hands Clasped  - 3 x daily - 7 x weekly - 1 sets - 10 reps - Seated Elbow Flexion and Extension AROM  - 3 x daily - 7 x weekly - 2 sets - 10 reps - Seated Forearm  Pronation and Supination AROM  - 3 x daily - 7 x weekly - 2 sets - 10 reps - Wrist AROM Flexion Extension  - 3 x daily - 7 x weekly - 2 sets - 10 reps   GOALS: Goals reviewed with patient? Yes  SHORT TERM GOALS: Target date: 09/01/21  STG  Status:  1 Pt will demonstrate understanding of initial HEP designed for RUE ROM Baseline: No HEP at this time Met - 09/02/21  2 Pt to improve R shoulder flexion strength to at least 2-/5 (partial ROM, gravity min) Baseline: shoulder flex MMT: 1/5 Met - 08/25/21  3 Pt will be able to grasp and lift lightweight object to mouth w/ R, dominant UE in at least 2 trials Baseline: Significantly limited gross  grasp Met - 08/25/21  4 Pt will achieve at least 50% of R wrist extension AROM against gravity Baseline: partial ROM in horizontal plane Progressing     LONG TERM GOALS: Target date: 10/03/21  LTG  Status:  1 Pt will improve FOTO score to at least 45 to indicate improved participation in functional activities Baseline: Intake - 4 Progressing  2 Pt will be able to achieve at least partial AROM of R shoulder flexion against gravity by discharge for improved functional use of RUE Baseline: shoulder flex MMT: 1/5 Progressing  3 Pt will be able to complete at least 10 blocks during Box and Blocks Test to indicated improved unilateral GMC of R, dominant UE Baseline: Unable at time of evaluation Progressing  4 Pt will be able to demonstrate incorporation of RUE as gross assist in at least 1 functional task (pulling up pants, stabilizing plate, etc.) Baseline: Not incorporating LUE at gross level at time of eval Progressing     ASSESSMENT:  CLINICAL IMPRESSION: Persistent R shoulder pain continues to be limiting w/ pt demonstrating subluxation, consistent poor posture w/ forward rounded shoulders, and scapular winging; R shoulder ER is also significantly limited, though arc of motion/pain improves when completing in supine position. Hemiplegic shoulder pain is likely considering aforementioned deficits, muscle imbalance impingement, and increased tone/spasticity. OT focused session on addressing scapular stability and NMR and strengthening of R shoulder muscles. Pt has follow-up w/ ortho MD on Monday 8/7 and OT will adjust or progress w/ POC accordingly.  PERFORMANCE DEFICITS in functional skills including ADLs, IADLs, coordination, dexterity, proprioception, sensation, tone, ROM, strength, pain, FMC, GMC, mobility, balance, body mechanics, decreased knowledge of use of DME, and UE functional use, cognitive skills including safety awareness, and psychosocial skills including environmental adaptation.    IMPAIRMENTS are limiting patient from ADLs, IADLs, rest and sleep, and work.   COMORBIDITIES may have co-morbidities  that affects occupational performance. Patient will benefit from skilled OT to address above impairments and improve overall function.   PLAN: OT FREQUENCY: 3x/week  OT DURATION: 8 weeks  PLANNED INTERVENTIONS: self care/ADL training, therapeutic exercise, therapeutic activity, neuromuscular re-education, manual therapy, passive range of motion, balance training, functional mobility training, aquatic therapy, splinting, electrical stimulation, ultrasound, iontophoresis, moist heat, cryotherapy, patient/family education, and DME and/or AE instructions  RECOMMENDED OTHER SERVICES: Currently receiving PT services at this location  CONSULTED AND AGREED WITH PLAN OF CARE: Patient and family member/caregiver  PLAN FOR NEXT SESSION: Continue to target RUE GM and [place], coordination, and strength; wb and somatosensory input for Olivia Lopez de Gutierrez, MSOT, OTR/L 09/11/2021, 9:32 AM

## 2021-09-12 ENCOUNTER — Encounter: Payer: Self-pay | Admitting: Physical Therapy

## 2021-09-12 ENCOUNTER — Encounter: Payer: Self-pay | Admitting: Occupational Therapy

## 2021-09-15 ENCOUNTER — Ambulatory Visit (INDEPENDENT_AMBULATORY_CARE_PROVIDER_SITE_OTHER): Payer: Medicare Other | Admitting: Physician Assistant

## 2021-09-15 ENCOUNTER — Encounter: Payer: Self-pay | Admitting: Physician Assistant

## 2021-09-15 ENCOUNTER — Other Ambulatory Visit (HOSPITAL_COMMUNITY): Payer: Self-pay

## 2021-09-15 DIAGNOSIS — M25511 Pain in right shoulder: Secondary | ICD-10-CM

## 2021-09-15 DIAGNOSIS — M7501 Adhesive capsulitis of right shoulder: Secondary | ICD-10-CM

## 2021-09-15 MED ORDER — LIDOCAINE HCL 1 % IJ SOLN
3.0000 mL | INTRAMUSCULAR | Status: AC | PRN
  Administered 2021-09-15: 3 mL

## 2021-09-15 MED ORDER — METHYLPREDNISOLONE ACETATE 40 MG/ML IJ SUSP
40.0000 mg | INTRAMUSCULAR | Status: AC | PRN
  Administered 2021-09-15: 40 mg via INTRA_ARTICULAR

## 2021-09-15 NOTE — Progress Notes (Signed)
   Procedure Note  Patient: Aaron Dennis             Date of Birth: 01-Jun-1955           MRN: [id number]             Visit Date: 09/15/2021 HPI: [name] returns today for follow-up of his right shoulder.  He has been going to therapy feels it is helping some with his mobility.  He is still having pain with the shoulder with range of motion no new injury.  He states he has been told that he needs a Botox injection in his shoulder.  Physical exam: Right shoulder passive and active forward flexion to only 90 degrees secondary to pain.  He has very limited external rotation full internal rotation right shoulder.  Unable to assess rotator cuff strength secondary to pain.  Procedures: Visit Diagnoses:  1. Acute pain of right shoulder   2. Adhesive capsulitis of right shoulder     Large Joint Inj: R subacromial bursa on 09/15/2021 11:20 AM Indications: pain Details: 22 G 1.5 in needle, superior approach  Arthrogram: No  Medications: 3 mL lidocaine 1 %; 40 mg methylPREDNISolone acetate 40 MG/ML Outcome: tolerated well, no immediate complications Procedure, treatment alternatives, risks and benefits explained, specific risks discussed. Consent was given by the patient. Immediately prior to procedure a time out was called to verify the correct patient, procedure, equipment, support staff and site/side marked as required. Patient was prepped and draped in the usual sterile fashion.    Plan: Discussed with him placing a cortisone injection in his shoulder reviewed agreeable.  After the injection passively I can bring him to 110 degrees forward flexion.  His external rotation was also slightly improved.  Recommend he work on range of motion exercises shown.  Continue therapy.  See back in 2 weeks see how he is doing overall.  Continues to have diminished range of motion right shoulder recommend MRI rule out rotator cuff tear.

## 2021-09-18 DIAGNOSIS — R531 Weakness: Secondary | ICD-10-CM | POA: Diagnosis not present

## 2021-09-18 DIAGNOSIS — Q782 Osteopetrosis: Secondary | ICD-10-CM | POA: Diagnosis not present

## 2021-09-18 DIAGNOSIS — I639 Cerebral infarction, unspecified: Secondary | ICD-10-CM | POA: Diagnosis not present

## 2021-09-18 DIAGNOSIS — M549 Dorsalgia, unspecified: Secondary | ICD-10-CM | POA: Diagnosis not present

## 2021-09-18 DIAGNOSIS — M25561 Pain in right knee: Secondary | ICD-10-CM | POA: Diagnosis not present

## 2021-09-18 NOTE — Therapy (Signed)
OUTPATIENT PHYSICAL THERAPY TREATMENT   Patient Name: Aaron Dennis MRN: [id number] DOB:[birth year], 66 y.o., male Today's Date: 09/19/2021   PT End of Session - 09/19/21 0847     Visit Number 15    Date for PT Re-Evaluation 10/06/21    Authorization Type UHC Medicare    PT Start Time 0845    PT Stop Time 0930    PT Time Calculation (min) 45 min    Equipment Utilized During Treatment Gait belt    Activity Tolerance Patient tolerated treatment well    Behavior During Therapy WFL for tasks assessed/performed                        Past Medical History:  Diagnosis Date   GERD (gastroesophageal reflux disease)    Hypertension    Lung nodule    7 mm LUL nodule   Past Surgical History:  Procedure Laterality Date   IR RADIOLOGIST EVAL & MGMT  07/02/2021   IR RADIOLOGIST EVAL & MGMT  07/22/2021   Patient Active Problem List   Diagnosis Date Noted   Aneurysm, cerebral, nonruptured 08/13/2021   Status post stroke due to cerebrovascular disease    Cerebral edema ([place]) 04/29/2021   Stroke (cerebrum) ([place]) 04/29/2021   Right sided weakness    Acute CVA (cerebrovascular accident) ([place]) 04/27/2021   Primary osteoarthritis of left knee 01/13/2019   Primary osteoarthritis of right knee 01/13/2019   Flat foot 11/28/2017   Tendonitis of foot 11/28/2017   Polycythemia 05/12/2017   Vitamin D deficiency 04/21/2017   Bony sclerosis 08/05/2016   Lung nodule    Chronic pain of both knees 12/05/2015   Gastroesophageal reflux disease without esophagitis 05/17/2014   Essential hypertension 05/17/2014   Coronary artery calcification 08/17/2012   Abnormal LFTs (liver function tests) 07/11/2012   Chronic abdominal pain 06/24/2012   Right-sided chest wall pain 06/24/2012   Constipation, chronic 06/24/2012   Hyperbilirubinemia 06/24/2012   Chronic back pain 06/24/2012   DDD (degenerative disc disease), thoracolumbar 06/24/2012   History of nephrolithiasis 06/24/2012   Side  pain 06/24/2012   Low back pain at multiple sites 06/24/2012    PCP: [name]  REFERRING PROVIDER: [name]  REFERRING DIAG: M25.511  THERAPY DIAG:  Hemiplegia and hemiparesis following cerebral infarction affecting right dominant side (HCC)  Muscle weakness (generalized)  Other abnormalities of gait and mobility  Chronic bilateral low back pain without sciatica  Abnormal posture  Acute pain of right shoulder  Status post stroke due to cerebrovascular disease  Cerebrovascular accident (CVA) due to embolism of left middle cerebral artery ([place])  Other symptoms and signs involving the nervous system  Rationale for Evaluation and Treatment Rehabilitation  ONSET DATE: 07/15/21  SUBJECTIVE:  SUBJECTIVE STATEMENT: Been doing good, no falls. Still having pain in my shoulder and hip but it is always there.   PERTINENT HISTORY: Acute CVA 3/19  PAIN:  Are you having pain? No  PRECAUTIONS: Fall  WEIGHT BEARING RESTRICTIONS No  FALLS:  Has patient fallen in last 6 months? Yes. Number of falls 4  LIVING ENVIRONMENT: Lives with: lives with their family Lives in: House/apartment Stairs: Yes: External: 2 steps; none Has following equipment at home: Single point cane, Hemi walker, Wheelchair (manual), and Grab bars  OCCUPATION: retired  PLOF: [place] try to get stronger, try to use my legs and feet   OBJECTIVE:   DIAGNOSTIC FINDINGS:  Right shoulder 3 views: Shoulder is well located.  Subacromial space well-maintained.  Type II downsloping acromion.  No acute fractures or acute findings Right wrist 3 views: No acute fractures.  No subluxation dislocation of  the carpal bones.  Wrist joints well-maintained.  No bony abnormalities.  PATIENT SURVEYS:  FOTO  4  COGNITION:  Overall cognitive status: Within functional limits for tasks assessed     SENSATION: WFL  POSTURE: Decreased R shoulder height, hemiparesis, forward head, flexed trunk, R lateral lean  UPPER EXTREMITY ROM:   Passive ROM Right eval 08/28/21  Shoulder flexion 60d w/pain 62  W/pain  Shoulder extension    Shoulder abduction 80d w/pain  78 w/pain  Shoulder adduction    Shoulder internal rotation Full w/end range pain   Shoulder external rotation 40d w/pain 35 w/pain  Elbow flexion full   Elbow extension full   Wrist flexion    Wrist extension    Wrist ulnar deviation    Wrist radial deviation    Wrist pronation    Wrist supination    (Blank rows = not tested)  UPPER EXTREMITY MMT: L [facility]  MMT Right eval 08/28/21  Shoulder flexion 1 1  Shoulder extension    Shoulder abduction    Shoulder adduction 2- 2-  Shoulder internal rotation 2- 2  Shoulder external rotation 2- 2-  Middle trapezius    Lower trapezius    Elbow flexion 2+ 2+  Elbow extension 2- 2  Wrist flexion    Wrist extension    Wrist ulnar deviation    Wrist radial deviation    Wrist pronation    Wrist supination    Grip strength (lbs)    (Blank rows = not tested)  LE MMT 3/5 hip flexion, knee ext, knee flexion  Using hemiwalker, decrease R foot clearance, R foot ER, decrease hip flexion, decreased stance time on R LE, decreased step length, foot drop  SHOULDER SPECIAL TESTS:  Impingement tests: Neer impingement test: positive , Hawkins/Kennedy impingement test: positive , and Painful arc test: positive     Instability tests: Apprehension test: positive  and Sulcus sign: positive   JOINT MOBILITY TESTING:  Pain with PROM, muscle guarding, instability in R shoulder, scapular winging  PALPATION:  No TTP   FUNCTIONAL MEASURES Eval BERG 24/56 TUG 57.28s--slowed gait using hemiwalker, no foot clearance and decreased safety awareness esp with turns 5xSTS-- 25.72s, LUE push off,  decreased control w/descending   TODAY'S TREATMENT:  09/19/21 Bike L5 x47mns   Standing marches 5#  Standing ABD w/5# 4 way step over  Walking laps w/device x1 Walking lap with 2HHA for speed x1 Step ups 6"   09/11/21 Recumbent bike L3.5 x 5 minute, then 1 additional minute fast RPM Supine with R foot planted on red Physioball, push through  RLE into ext against resistance, then hold the ext while lifting LLE. Good control demonstrated. SLS with cane in LUE, encouraged to stand on RLE and rotate hips, therapist facilitated RLE WB with good control. Progressed to crossover steps in each direction, therapist facilitated hip rotation and weight shift onto RLE.  Quick steps in all directions upon command of therapist. Patient required Mod VC to move quickly Gait training with straight cane, 2 x 100' with S and VC for increased speed-Patient demonstrates improved/normalized gait pattern when he walks at a normal speed.  09/09/21 NuStep L4 x 6 min  S2S 2x10 then UE assist x1 Standing march with hemi walker 2x10 Leg Press 20lb 2x10, Single leg 20lb x 5 Side steps front of mat table  4in step ups HHA x1 2x5 Gait 76f x 2 no AD cues to increase step lenght Standing hip abd & Ext 2lb x10 each   09/02/21 Nustep L5 x637ms Step ups 4" x10 on LLE x5 on RLE  Walking w/o AD, HHA for speed  Side steps over obstacles 2 way hip 2# 2x10 bilat  Calf raises 2# 2x12  09/01/21 Nustep L5 x6m57m  Obstacle course (step up 4", ladder, step up on airex) Walking with [facility] speed 2 laps around gym  Knee ext 5# x10, 10# RLE eccentric  Knee flexion 20# x10, 10# x10 RLE  Leg press   08/28/21 Reasses goals for progress note TUG 48.95s 5xSTS 20.35s Nustep L5x6mi67mWalking w/o AD 2 laps  Calf raises 2x10  08/26/21 Nustep L5 x6min51malking w/o AD 2 laps  STS from elevated mat table 2x10 [place]itter 2x10  Bridges 2x10 SLR 2x10 RLE    08/25/21 Nustep L6 x5mins32m curls 20# 2x10, RLE only #10 x10 Knee  ext 10# x10, eccentric x10, 5# RLE only x10  Step ups 4" Walking w/o AD in // bars forwards, backwards, side steps    08/21/21 Patient reports L lateral knee pain. His ITB is very tight. Therapist performed STM to ITB followed by stretch in supine with knee to opposite chest. Educated him to use tennis ball for further STM at home. Patient noted to overuse L side with all mobility, demosntrates decreased WB through RLE at all times, probably due to sensory changes and weakness with patient not trusting RLE. Educated him to the need to incorporate RLE into all mobility. Therapist first facilitated lower trunk activation and mobilization sitting on physiodisc-performed ant/post, Lat, and then circular lower trunk motions, required min-mod TC for correct technique. Moved sit to stand, educating patient with VC and tactile facilitation to remain centered with equal WB through both legs. Facilitated RLE activation. Performed repeated sit to stand, required min-mod facilit  08/19/21 NM re-ed for R pelvis and shoulder girdle-Sidelie, R pelvic mobility-ant/post, sup/inf, and into diagonals, min TC and VC for correct techqniue and isolated movement. He then mobilized R scapula in same planes, with more limited movement, moved to active shoulder protraction and retraction and active hip abd with forward and back swing to engage motor stability. Moved to sit and continued to perform reaching activities to engage R scapula and shoulder.He then moved to steps and performed step ups x 10 with RLE on 4" step, min A to facilitate correct weight shift and active RLE control.  Ambulated without AD, CGA, VC to increase strep length, no unsteadiness noted.   PATIENT EDUCATION: Education details: POC Person educated: Patient and brother Education method: ExplanCustomer service managertion comprehension: verbalized understanding   HOME EXERCISE  PROGRAM: TBD   ASSESSMENT:  CLINICAL IMPRESSION:   Patient  returns after not being in therapy for a week. He is walking faster today and taking longer strides. Does well with step overs using hemi-walker, cues to take big steps. He is able to increase his speed and walk with more normal gait pattern with [facility] [place] with PT controlling speed. BP 110/83   OBJECTIVE IMPAIRMENTS Abnormal gait, decreased mobility, difficulty walking, decreased ROM, decreased strength, decreased safety awareness, and pain.   ACTIVITY LIMITATIONS carrying, lifting, transfers, bathing, toileting, dressing, reach over head, and locomotion level  PARTICIPATION LIMITATIONS: cleaning, laundry, driving, community activity, and yard work  PERSONAL FACTORS Age, Time since onset of injury/illness/exacerbation, and 3+ comorbidities: chronic pain, acute CVA, stroke, OA  are also affecting patient's functional outcome.   REHAB POTENTIAL: Fair due to time since stroke and increased deficits on R side  CLINICAL DECISION MAKING: Evolving/moderate complexity  EVALUATION COMPLEXITY: Moderate   GOALS: Goals reviewed with patient? No  SHORT TERM GOALS: Target date: 09/08/21 Patient will be independent with initial HEP.  Goal status: ongoing  2.  Patient will be educated on strategies to decrease risk of falls.  Goal status: ongoing   LONG TERM GOALS: Target date: 10/06/21  1.  Patient will report 75% improvement in R shoulder pain to improve QOL.  Goal status: IN PROGRESS  2.  Patient to demonstrate improved upright posture with posterior shoulder girdle engaged to promote improved glenohumeral joint mobility. Baseline: decreased shoulder height on R side, decreased shoulder stability, scapular winging  Goal status: ongoing  3.  Patient to improve R shoulder AROM to  within patient's functional norm  without pain provocation to allow for increased ease of ADLs.  Baseline: measured w/PROM, minimal AROM, PROM taken 7/20 Goal status: IN PROGRESS  4.  Patient will demonstrate improved  functional UE and LE strength as demonstrated by >= 4/5 in all muscle groups . Goal status: IN PROGRESS  5.  Patient will report 24 on FOTO(patient outcome measure)  to demonstrate improved functional ability.  Baseline: 4, 28 Goal status: IN PROGRESS  6. Patient will score 34 or greater on Berg Balance test to demonstrate lower risk of falls. (MCID= 8 points) .  Baseline: 24, 30  Goal status: ongoing  7. Patient will demonstrate decreased fall risk by scoring < 25 sec on TUG and <20 on 5xSTS. Baseline: 57.28s, 25.72s, 7/20-21.35s, 48.95s Goal status: ongoing     PLAN: PT FREQUENCY: 3x/week  PT DURATION: 8 weeks  PLANNED INTERVENTIONS: Therapeutic exercises, Therapeutic activity, Neuromuscular re-education, Balance training, Gait training, Patient/Family education, Joint mobilization, Stair training, Orthotic/Fit training, and Manual therapy  PLAN FOR NEXT SESSION: NM re-ed, gait training  Ethel Rana DPT 09/19/21 9:32 AM  09/19/21 9:32 AM

## 2021-09-19 ENCOUNTER — Encounter: Payer: Self-pay | Admitting: Occupational Therapy

## 2021-09-19 ENCOUNTER — Ambulatory Visit: Payer: Medicare Other | Admitting: Occupational Therapy

## 2021-09-19 ENCOUNTER — Ambulatory Visit: Payer: Medicare Other

## 2021-09-19 DIAGNOSIS — I63412 Cerebral infarction due to embolism of left middle cerebral artery: Secondary | ICD-10-CM | POA: Diagnosis not present

## 2021-09-19 DIAGNOSIS — M6281 Muscle weakness (generalized): Secondary | ICD-10-CM | POA: Diagnosis not present

## 2021-09-19 DIAGNOSIS — I69351 Hemiplegia and hemiparesis following cerebral infarction affecting right dominant side: Secondary | ICD-10-CM | POA: Diagnosis not present

## 2021-09-19 DIAGNOSIS — M545 Low back pain, unspecified: Secondary | ICD-10-CM | POA: Diagnosis not present

## 2021-09-19 DIAGNOSIS — R29818 Other symptoms and signs involving the nervous system: Secondary | ICD-10-CM

## 2021-09-19 DIAGNOSIS — M25531 Pain in right wrist: Secondary | ICD-10-CM

## 2021-09-19 DIAGNOSIS — R2689 Other abnormalities of gait and mobility: Secondary | ICD-10-CM

## 2021-09-19 DIAGNOSIS — M25511 Pain in right shoulder: Secondary | ICD-10-CM

## 2021-09-19 DIAGNOSIS — Z8673 Personal history of transient ischemic attack (TIA), and cerebral infarction without residual deficits: Secondary | ICD-10-CM | POA: Diagnosis not present

## 2021-09-19 DIAGNOSIS — G8929 Other chronic pain: Secondary | ICD-10-CM | POA: Diagnosis not present

## 2021-09-19 DIAGNOSIS — R293 Abnormal posture: Secondary | ICD-10-CM

## 2021-09-19 NOTE — Therapy (Unsigned)
OUTPATIENT OCCUPATIONAL THERAPY TREATMENT NOTE  Patient Name: Aaron Dennis MRN: [id number] DOB:1955-09-12, 66 y.o., male Today's Date: 09/19/2021  PCP: Charlott Rakes, MD REFERRING PROVIDER: Pete Pelt, PA-C   OT End of Session - 09/19/21 [phone]     Visit Number 15    Number of Visits 25    Date for OT Re-Evaluation 10/03/21    Authorization Type UHC Medicare; Medicaid [place] (secondary)    Authorization Time Period VL: [place]    Progress Note Due on Visit 28    OT Start Time 0932    OT Stop Time 1012    OT Time Calculation (min) 40 min    Activity Tolerance Patient limited by pain    Behavior During Therapy [facility] for tasks assessed/performed            Past Medical History:  Diagnosis Date   GERD (gastroesophageal reflux disease)    Hypertension    Lung nodule    7 mm LUL nodule   Past Surgical History:  Procedure Laterality Date   IR RADIOLOGIST EVAL & MGMT  07/02/2021   IR RADIOLOGIST EVAL & MGMT  07/22/2021   Patient Active Problem List   Diagnosis Date Noted   Aneurysm, cerebral, nonruptured 08/13/2021   Status post stroke due to cerebrovascular disease    Cerebral edema ([place]) 04/29/2021   Stroke (cerebrum) ([place]) 04/29/2021   Right sided weakness    Acute CVA (cerebrovascular accident) ([place]) 04/27/2021   Primary osteoarthritis of left knee 01/13/2019   Primary osteoarthritis of right knee 01/13/2019   Flat foot 11/28/2017   Tendonitis of foot 11/28/2017   Polycythemia 05/12/2017   Vitamin D deficiency 04/21/2017   Bony sclerosis 08/05/2016   Lung nodule    Chronic pain of both knees 12/05/2015   Gastroesophageal reflux disease without esophagitis 05/17/2014   Essential hypertension 05/17/2014   Coronary artery calcification 08/17/2012   Abnormal LFTs (liver function tests) 07/11/2012   Chronic abdominal pain 06/24/2012   Right-sided chest wall pain 06/24/2012   Constipation, chronic 06/24/2012   Hyperbilirubinemia 06/24/2012   Chronic back pain  06/24/2012   DDD (degenerative disc disease), thoracolumbar 06/24/2012   History of nephrolithiasis 06/24/2012   Side pain 06/24/2012   Low back pain at multiple sites 06/24/2012    ONSET DATE: 07/17/21 (date of OT order)  REFERRING DIAG: M25.531 (ICD-10-CM) - Pain in right wrist M25.511 (ICD-10-CM) - Acute pain of right shoulder   THERAPY DIAG:  Hemiplegia and hemiparesis following cerebral infarction affecting right dominant side (HCC)  Acute pain of right shoulder  Pain in right wrist  Muscle weakness (generalized)  Other abnormalities of gait and mobility  Rationale for Evaluation and Treatment Rehabilitation  SUBJECTIVE:   SUBJECTIVE STATEMENT: Pt reports his shoulder still hurts w/ movement, but that he got cortisone injection last week and will be getting an MRI hopefully next week Pt accompanied by: self and family member (brother, Selinda Michaels, drove pt to clinic)  PAIN: Are you having pain? No  PERTINENT HISTORY: L frontoparietal CVA w/ residual R-sided weakness, presented to ED 04/27/21 after recent prolonged travel; PMH includes h/o polycythemia, HTN  PRECAUTIONS: Fall  PLOF: Independent and Vocation/Vocational requirements: retired Architect  PATIENT GOALS "Work on my shoulder"   OBJECTIVE:   ------------------------------------------------------------------------------------------------------------------------------------------------------  UPPER EXTREMITY ROM: LUE WFL; RUE measurements below  Active ROM Right Eval - 6/26 Right 7/20  Shoulder flexion Unable 30%  Shoulder abduction Unable 40%  Shoulder adduction WFL   Shoulder extension 24  Shoulder internal rotation [facility]   Shoulder external rotation Unable Unable due to pain  Elbow flexion WFL   Elbow extension 33 90%  Wrist flexion 27 80%  Wrist extension 28   Wrist pronation To be assessed   Wrist supination To be assessed   (Blank rows = not tested)  UPPER EXTREMITY MMT:     MMT  Right Eval - 6/26 Right 7/20  Shoulder flexion 1/5 2-/5  Shoulder abduction 1/5 2+/5  Shoulder extension 3-/5   Shoulder internal rotation 2/5   Shoulder external rotation 2-/5   Elbow flexion 3/5 3/5  Elbow extension 2-/5 2+/5  Wrist flexion 2-/5 2+/5  Wrist extension 2-/5 2+/5  Wrist pronation 2+/5 2+/5  Wrist supination 2+/5 2+/5  (Blank rows = not tested)  ------------------------------------------------------------------------------------------------------------------------------------------------------  TODAY'S TREATMENT - 09/19/21:  Towel Slides Towel slides on tabletop w/ BUEs/LUE/RUE for shoulder flex/elbow ext to facilitate increased ROM and stretch; OT provided min verbal and tactile facilitation/cues to decrease compensatory shoulder elevation and improve shoulder alignment during exercise   PROM   Scapular Stability Scapular mobilization for retraction/depression w/ pt in side-lying on L side and RUE resting on tabletop to facilitate decreased pain, improved shoulder ROM, and enhance overall function of RUE. Pt able to tolerate w/out discomfort.  Then attempted shoulder forward flex in side-lying w/ pt unable to tolerate due to pain during extension back to starting position. OT repositioned pt to supine for scapular stability and focused on ROM of R shoulder ER, completing PROM w/ stretch held at end range to increase ROM and decrease stiffness/impact of spasticity. OT provided occ verbal cues to relax RUE prn; able to complete w/ discomfort decreasing w/ reptition. Increased tone noted slightly past neutral position. Completed AROM of R shoulder ER 15x immediately following PROM w/ pt able to achieve about 40% of arc of motion w/out increased pain.    PATIENT EDUCATION: Ongoing condition-specific education related to therapeutic interventions completed this session and persistent shoulder pain Person educated: Patient Education method: Explanation Education  comprehension: verbalized understanding   HOME EXERCISE PROGRAM: Putty Exercises (see pt instructions) MedBridge Code: [id number] - Seated Shoulder Shrugs  - 3 x daily - 7 x weekly - 1 sets - 15 reps - Seated Shoulder Shrug Circles AROM Backward  - 3 x daily - 7 x weekly - 1 sets - 15 reps - Seated Scapular Retraction  - 3 x daily - 7 x weekly - 1 sets - 15 reps - Seated Shoulder External Rotation AAROM with Dowel  - 3 x daily - 7 x weekly - 1 sets - 10 reps - Supine Shoulder Flexion AAROM with Hands Clasped  - 3 x daily - 7 x weekly - 1 sets - 10 reps - Seated Elbow Flexion and Extension AROM  - 3 x daily - 7 x weekly - 2 sets - 10 reps - Seated Forearm Pronation and Supination AROM  - 3 x daily - 7 x weekly - 2 sets - 10 reps - Wrist AROM Flexion Extension  - 3 x daily - 7 x weekly - 2 sets - 10 reps   GOALS: Goals reviewed with patient? Yes  SHORT TERM GOALS: Target date: 09/01/21  STG  Status:  1 Pt will demonstrate understanding of initial HEP designed for RUE ROM Baseline: No HEP at this time Met - 09/02/21  2 Pt to improve R shoulder flexion strength to at least 2-/5 (partial ROM, gravity min) Baseline: shoulder flex MMT: 1/5 Met - 08/25/21  3 Pt will be able to grasp and lift lightweight object to mouth w/ R, dominant UE in at least 2 trials Baseline: Significantly limited gross grasp Met - 08/25/21  4 Pt will achieve at least 50% of R wrist extension AROM against gravity Baseline: partial ROM in horizontal plane Progressing     LONG TERM GOALS: Target date: 10/03/21  LTG  Status:  1 Pt will improve FOTO score to at least 45 to indicate improved participation in functional activities Baseline: Intake - 4 Progressing  2 Pt will be able to achieve at least partial AROM of R shoulder flexion against gravity by discharge for improved functional use of RUE Baseline: shoulder flex MMT: 1/5 Progressing  3 Pt will be able to complete at least 10 blocks during Box and Blocks Test to  indicated improved unilateral GMC of R, dominant UE Baseline: Unable at time of evaluation Progressing  4 Pt will be able to demonstrate incorporation of RUE as gross assist in at least 1 functional task (pulling up pants, stabilizing plate, etc.) Baseline: Not incorporating LUE at gross level at time of eval Progressing     ASSESSMENT:  CLINICAL IMPRESSION: Persistent R shoulder pain continues to be limiting w/ pt demonstrating subluxation, consistent poor posture w/ forward rounded shoulders, and scapular winging; R shoulder ER is also significantly limited, though arc of motion/pain improves when completing in supine position. Hemiplegic shoulder pain is likely considering aforementioned deficits, muscle imbalance impingement, and increased tone/spasticity. OT focused session on addressing scapular stability and NMR and strengthening of R shoulder muscles. Pt has follow-up w/ ortho MD on Monday 8/7 and OT will adjust or progress w/ POC accordingly.  PERFORMANCE DEFICITS in functional skills including ADLs, IADLs, coordination, dexterity, proprioception, sensation, tone, ROM, strength, pain, FMC, GMC, mobility, balance, body mechanics, decreased knowledge of use of DME, and UE functional use, cognitive skills including safety awareness, and psychosocial skills including environmental adaptation.   IMPAIRMENTS are limiting patient from ADLs, IADLs, rest and sleep, and work.   COMORBIDITIES may have co-morbidities  that affects occupational performance. Patient will benefit from skilled OT to address above impairments and improve overall function.   PLAN: OT FREQUENCY: 3x/week  OT DURATION: 8 weeks  PLANNED INTERVENTIONS: self care/ADL training, therapeutic exercise, therapeutic activity, neuromuscular re-education, manual therapy, passive range of motion, balance training, functional mobility training, aquatic therapy, splinting, electrical stimulation, ultrasound, iontophoresis, moist heat,  cryotherapy, patient/family education, and DME and/or AE instructions  RECOMMENDED OTHER SERVICES: Currently receiving PT services at this location  CONSULTED AND AGREED WITH PLAN OF CARE: Patient and family member/caregiver  PLAN FOR NEXT SESSION: Continue to target RUE GM and [place], coordination, and strength; wb and somatosensory input for [place], MSOT, OTR/L 09/19/2021, 9:39 AM

## 2021-09-22 ENCOUNTER — Telehealth: Payer: Self-pay | Admitting: Physician Assistant

## 2021-09-22 DIAGNOSIS — M25511 Pain in right shoulder: Secondary | ICD-10-CM

## 2021-09-22 NOTE — Telephone Encounter (Signed)
Pt called and states that he needs to speak with Artis Delay about making an mri appt?   CB [phone]

## 2021-09-22 NOTE — Therapy (Signed)
OUTPATIENT PHYSICAL THERAPY TREATMENT   Patient Name: Aaron Dennis MRN: [id number] DOB:10/02/1955, 66 y.o., male Today's Date: 09/23/2021   PT End of Session - 09/23/21 1015     Visit Number 16    Date for PT Re-Evaluation 10/06/21    Authorization Type UHC Medicare    PT Start Time 1013    PT Stop Time 1100    PT Time Calculation (min) 47 min    Equipment Utilized During Treatment Gait belt    Activity Tolerance Patient tolerated treatment well    Behavior During Therapy WFL for tasks assessed/performed                         Past Medical History:  Diagnosis Date   GERD (gastroesophageal reflux disease)    Hypertension    Lung nodule    7 mm LUL nodule   Past Surgical History:  Procedure Laterality Date   IR RADIOLOGIST EVAL & MGMT  07/02/2021   IR RADIOLOGIST EVAL & MGMT  07/22/2021   Patient Active Problem List   Diagnosis Date Noted   Aneurysm, cerebral, nonruptured 08/13/2021   Status post stroke due to cerebrovascular disease    Cerebral edema ([place]) 04/29/2021   Stroke (cerebrum) ([place]) 04/29/2021   Right sided weakness    Acute CVA (cerebrovascular accident) ([place]) 04/27/2021   Primary osteoarthritis of left knee 01/13/2019   Primary osteoarthritis of right knee 01/13/2019   Flat foot 11/28/2017   Tendonitis of foot 11/28/2017   Polycythemia 05/12/2017   Vitamin D deficiency 04/21/2017   Bony sclerosis 08/05/2016   Lung nodule    Chronic pain of both knees 12/05/2015   Gastroesophageal reflux disease without esophagitis 05/17/2014   Essential hypertension 05/17/2014   Coronary artery calcification 08/17/2012   Abnormal LFTs (liver function tests) 07/11/2012   Chronic abdominal pain 06/24/2012   Right-sided chest wall pain 06/24/2012   Constipation, chronic 06/24/2012   Hyperbilirubinemia 06/24/2012   Chronic back pain 06/24/2012   DDD (degenerative disc disease), thoracolumbar 06/24/2012   History of nephrolithiasis 06/24/2012   Side  pain 06/24/2012   Low back pain at multiple sites 06/24/2012    PCP: [name]  REFERRING PROVIDER: [name]  REFERRING DIAG: M25.511  THERAPY DIAG:  Hemiplegia and hemiparesis following cerebral infarction affecting right dominant side (HCC)  Muscle weakness (generalized)  Acute pain of right shoulder  Other abnormalities of gait and mobility  Other symptoms and signs involving the nervous system  Status post stroke due to cerebrovascular disease  Abnormal posture  Rationale for Evaluation and Treatment Rehabilitation  ONSET DATE: 07/15/21  SUBJECTIVE:  SUBJECTIVE STATEMENT: Doing okay still having a lot of shoulder pain. No falls.    PERTINENT HISTORY: Acute CVA 3/19  PAIN:  Are you having pain? No  PRECAUTIONS: Fall  WEIGHT BEARING RESTRICTIONS No  FALLS:  Has patient fallen in last 6 months? Yes. Number of falls 4  LIVING ENVIRONMENT: Lives with: lives with their family Lives in: House/apartment Stairs: Yes: External: 2 steps; none Has following equipment at home: Single point cane, Hemi walker, Wheelchair (manual), and Grab bars  OCCUPATION: retired  PLOF: [place] try to get stronger, try to use my legs and feet   OBJECTIVE:   DIAGNOSTIC FINDINGS:  Right shoulder 3 views: Shoulder is well located.  Subacromial space well-maintained.  Type II downsloping acromion.  No acute fractures or acute findings Right wrist 3 views: No acute fractures.  No subluxation dislocation of  the carpal bones.  Wrist joints well-maintained.  No bony abnormalities.  PATIENT SURVEYS:  FOTO 4  COGNITION:  Overall cognitive status: Within functional limits for tasks assessed     SENSATION: WFL  POSTURE: Decreased R shoulder height, hemiparesis, forward  head, flexed trunk, R lateral lean  UPPER EXTREMITY ROM:   Passive ROM Right eval 08/28/21  Shoulder flexion 60d w/pain 62  W/pain  Shoulder extension    Shoulder abduction 80d w/pain  78 w/pain  Shoulder adduction    Shoulder internal rotation Full w/end range pain   Shoulder external rotation 40d w/pain 35 w/pain  Elbow flexion full   Elbow extension full   Wrist flexion    Wrist extension    Wrist ulnar deviation    Wrist radial deviation    Wrist pronation    Wrist supination    (Blank rows = not tested)  UPPER EXTREMITY MMT: [facility]  MMT Right eval 08/28/21  Shoulder flexion 1 1  Shoulder extension    Shoulder abduction    Shoulder adduction 2- 2-  Shoulder internal rotation 2- 2  Shoulder external rotation 2- 2-  Middle trapezius    Lower trapezius    Elbow flexion 2+ 2+  Elbow extension 2- 2  Wrist flexion    Wrist extension    Wrist ulnar deviation    Wrist radial deviation    Wrist pronation    Wrist supination    Grip strength (lbs)    (Blank rows = not tested)  LE MMT 3/5 hip flexion, knee ext, knee flexion  Using hemiwalker, decrease R foot clearance, R foot ER, decrease hip flexion, decreased stance time on R LE, decreased step length, foot drop  SHOULDER SPECIAL TESTS:  Impingement tests: Neer impingement test: positive , Hawkins/Kennedy impingement test: positive , and Painful arc test: positive     Instability tests: Apprehension test: positive  and Sulcus sign: positive   JOINT MOBILITY TESTING:  Pain with PROM, muscle guarding, instability in R shoulder, scapular winging  PALPATION:  No TTP   FUNCTIONAL MEASURES Eval BERG 24/56 TUG 57.28s--slowed gait using hemiwalker, no foot clearance and decreased safety awareness esp with turns 5xSTS-- 25.72s, LUE push off, decreased control w/descending   TODAY'S TREATMENT:  09/23/21 Nustep L5 x4mns  SLR 2.5# 2x10  Bridges 2x10 SK bent fall out greenTB 2x10  HS stretch 30s bilat  Standing  on airex cone taps, 1HHA Step up on airex, 1HHA  Leg ext 10# 2x10 HS curls 25# 2x10  09/19/21 Bike L5 x566ms   Standing marches 5#  Standing ABD w/5# 4 way step over  Walking  laps w/device x1 Walking lap with 2HHA for speed x1 Step ups 6"   09/11/21 Recumbent bike L3.5 x 5 minute, then 1 additional minute fast RPM Supine with R foot planted on red Physioball, push through RLE into ext against resistance, then hold the ext while lifting LLE. Good control demonstrated. SLS with cane in LUE, encouraged to stand on RLE and rotate hips, therapist facilitated RLE WB with good control. Progressed to crossover steps in each direction, therapist facilitated hip rotation and weight shift onto RLE.  Quick steps in all directions upon command of therapist. Patient required Mod VC to move quickly Gait training with straight cane, 2 x 100' with S and VC for increased speed-Patient demonstrates improved/normalized gait pattern when he walks at a normal speed.  09/09/21 NuStep L4 x 6 min  S2S 2x10 then UE assist x1 Standing march with hemi walker 2x10 Leg Press 20lb 2x10, Single leg 20lb x 5 Side steps front of mat table  4in step ups HHA x1 2x5 Gait 90f x 2 no AD cues to increase step lenght Standing hip abd & Ext 2lb x10 each   09/02/21 Nustep L5 x622ms Step ups 4" x10 on LLE x5 on RLE  Walking w/o AD, HHA for speed  Side steps over obstacles 2 way hip 2# 2x10 bilat  Calf raises 2# 2x12  09/01/21 Nustep L5 x6m63m  Obstacle course (step up 4", ladder, step up on airex) [facility] 2 laps around gym  Knee ext 5# x10, 10# RLE eccentric  Knee flexion 20# x10, 10# x10 RLE  Leg press   08/28/21 Reasses goals for progress note TUG 48.95s 5xSTS 20.35s Nustep L5x6mi67mWalking w/o AD 2 laps  Calf raises 2x10  08/26/21 Nustep L5 x6min57malking w/o AD 2 laps  STS from elevated mat table 2x10 [place] 2x10  Bridges 2x10 SLR 2x10 RLE    08/25/21 Nustep L6 x5mins54m curls  20# 2x10, RLE only #10 x10 Knee ext 10# x10, eccentric x10, 5# RLE only x10  Step ups 4" Walking w/o AD in // bars forwards, backwards, side steps    08/21/21 Patient reports L lateral knee pain. His ITB is very tight. Therapist performed STM to ITB followed by stretch in supine with knee to opposite chest. Educated him to use tennis ball for further STM at home. Patient noted to overuse L side with all mobility, demosntrates decreased WB through RLE at all times, probably due to sensory changes and weakness with patient not trusting RLE. Educated him to the need to incorporate RLE into all mobility. Therapist first facilitated lower trunk activation and mobilization sitting on physiodisc-performed ant/post, Lat, and then circular lower trunk motions, required min-mod TC for correct technique. Moved sit to stand, educating patient with VC and tactile facilitation to remain centered with equal WB through both legs. Facilitated RLE activation. Performed repeated sit to stand, required min-mod facilit  08/19/21 NM re-ed for R pelvis and shoulder girdle-Sidelie, R pelvic mobility-ant/post, sup/inf, and into diagonals, min TC and VC for correct techqniue and isolated movement. He then mobilized R scapula in same planes, with more limited movement, moved to active shoulder protraction and retraction and active hip abd with forward and back swing to engage motor stability. Moved to sit and continued to perform reaching activities to engage R scapula and shoulder.He then moved to steps and performed step ups x 10 with RLE on 4" step, min A to facilitate correct weight shift and active RLE control.  Ambulated without AD, CGA, VC to increase strep length, no unsteadiness noted.   PATIENT EDUCATION: Education details: POC Person educated: Patient and brother Education method: Customer service manager Education comprehension: verbalized understanding   HOME EXERCISE PROGRAM: TBD    ASSESSMENT:  CLINICAL IMPRESSION:   Patient continues to walk with slowed gait and ataxic gait pattern. He states his L knee is starting to hurt now because he is using it more. Worked on LE strengthening and some balance today. Activities on airex require 1HHA to hemiwalker. Tolerates session well.     OBJECTIVE IMPAIRMENTS Abnormal gait, decreased mobility, difficulty walking, decreased ROM, decreased strength, decreased safety awareness, and pain.   ACTIVITY LIMITATIONS carrying, lifting, transfers, bathing, toileting, dressing, reach over head, and locomotion level  PARTICIPATION LIMITATIONS: cleaning, laundry, driving, community activity, and yard work  PERSONAL FACTORS Age, Time since onset of injury/illness/exacerbation, and 3+ comorbidities: chronic pain, acute CVA, stroke, OA  are also affecting patient's functional outcome.   REHAB POTENTIAL: Fair due to time since stroke and increased deficits on R side  CLINICAL DECISION MAKING: Evolving/moderate complexity  EVALUATION COMPLEXITY: Moderate   GOALS: Goals reviewed with patient? No  SHORT TERM GOALS: Target date: 09/08/21 Patient will be independent with initial HEP.  Goal status: ongoing  2.  Patient will be educated on strategies to decrease risk of falls.  Goal status: ongoing   LONG TERM GOALS: Target date: 10/06/21  1.  Patient will report 75% improvement in R shoulder pain to improve QOL.  Goal status: IN PROGRESS  2.  Patient to demonstrate improved upright posture with posterior shoulder girdle engaged to promote improved glenohumeral joint mobility. Baseline: decreased shoulder height on R side, decreased shoulder stability, scapular winging  Goal status: ongoing  3.  Patient to improve R shoulder AROM to  within patient's functional norm  without pain provocation to allow for increased ease of ADLs.  Baseline: measured w/PROM, minimal AROM, PROM taken 7/20 Goal status: IN PROGRESS  4.  Patient will  demonstrate improved functional UE and LE strength as demonstrated by >= 4/5 in all muscle groups . Goal status: IN PROGRESS  5.  Patient will report 70 on FOTO(patient outcome measure)  to demonstrate improved functional ability.  Baseline: 4, 28 Goal status: IN PROGRESS  6. Patient will score 34 or greater on Berg Balance test to demonstrate lower risk of falls. (MCID= 8 points) .  Baseline: 24, 30  Goal status: ongoing  7. Patient will demonstrate decreased fall risk by scoring < 25 sec on TUG and <20 on 5xSTS. Baseline: 57.28s, 25.72s, 7/20-21.35s, 48.95s Goal status: ongoing     PLAN: PT FREQUENCY: 3x/week  PT DURATION: 8 weeks  PLANNED INTERVENTIONS: Therapeutic exercises, Therapeutic activity, Neuromuscular re-education, Balance training, Gait training, Patient/Family education, Joint mobilization, Stair training, Orthotic/Fit training, and Manual therapy  PLAN FOR NEXT SESSION: NM re-ed, gait training , stepping over obstacles   Andris Baumann, DPT 09/23/21 11:01 AM  09/23/21 11:01 AM

## 2021-09-23 ENCOUNTER — Encounter: Payer: Self-pay | Admitting: Occupational Therapy

## 2021-09-23 ENCOUNTER — Ambulatory Visit: Payer: Medicare Other

## 2021-09-23 ENCOUNTER — Ambulatory Visit: Payer: Medicare Other | Admitting: Occupational Therapy

## 2021-09-23 DIAGNOSIS — I63412 Cerebral infarction due to embolism of left middle cerebral artery: Secondary | ICD-10-CM | POA: Diagnosis not present

## 2021-09-23 DIAGNOSIS — R293 Abnormal posture: Secondary | ICD-10-CM

## 2021-09-23 DIAGNOSIS — M25511 Pain in right shoulder: Secondary | ICD-10-CM | POA: Diagnosis not present

## 2021-09-23 DIAGNOSIS — M6281 Muscle weakness (generalized): Secondary | ICD-10-CM

## 2021-09-23 DIAGNOSIS — I69351 Hemiplegia and hemiparesis following cerebral infarction affecting right dominant side: Secondary | ICD-10-CM

## 2021-09-23 DIAGNOSIS — R2689 Other abnormalities of gait and mobility: Secondary | ICD-10-CM

## 2021-09-23 DIAGNOSIS — Z8673 Personal history of transient ischemic attack (TIA), and cerebral infarction without residual deficits: Secondary | ICD-10-CM | POA: Diagnosis not present

## 2021-09-23 DIAGNOSIS — R29818 Other symptoms and signs involving the nervous system: Secondary | ICD-10-CM

## 2021-09-23 DIAGNOSIS — M25531 Pain in right wrist: Secondary | ICD-10-CM | POA: Diagnosis not present

## 2021-09-23 DIAGNOSIS — G8929 Other chronic pain: Secondary | ICD-10-CM | POA: Diagnosis not present

## 2021-09-23 DIAGNOSIS — M545 Low back pain, unspecified: Secondary | ICD-10-CM | POA: Diagnosis not present

## 2021-09-23 NOTE — Telephone Encounter (Signed)
Mri ordered 

## 2021-09-23 NOTE — Addendum Note (Signed)
Addended by: Robyne Peers on: 09/23/2021 08:26 AM   Modules accepted: Orders

## 2021-09-23 NOTE — Therapy (Addendum)
OUTPATIENT OCCUPATIONAL THERAPY TREATMENT NOTE  Patient Name: Aaron Dennis MRN: [id number] DOB:1955/06/05, 66 y.o., male Today's Date: 09/23/2021  PCP: Charlott Rakes, MD REFERRING PROVIDER: Pete Pelt, PA-C  Patient did not return to therapy, see below for last known patient status   OT End of Session - 09/23/21 0942     Visit Number 16    Number of Visits 25    Date for OT Re-Evaluation 10/03/21    Authorization Type UHC Medicare; Medicaid [place] (secondary)    Authorization Time Period VL: [place]    Progress Note Due on Visit 62    OT Start Time 0932    OT Stop Time 1012    OT Time Calculation (min) 40 min    Activity Tolerance Patient limited by pain    Behavior During Therapy [facility] for tasks assessed/performed            Past Medical History:  Diagnosis Date   GERD (gastroesophageal reflux disease)    Hypertension    Lung nodule    7 mm LUL nodule   Past Surgical History:  Procedure Laterality Date   IR RADIOLOGIST EVAL & MGMT  07/02/2021   IR RADIOLOGIST EVAL & MGMT  07/22/2021   Patient Active Problem List   Diagnosis Date Noted   Aneurysm, cerebral, nonruptured 08/13/2021   Status post stroke due to cerebrovascular disease    Cerebral edema ([place]) 04/29/2021   Stroke (cerebrum) ([place]) 04/29/2021   Right sided weakness    Acute CVA (cerebrovascular accident) ([place]) 04/27/2021   Primary osteoarthritis of left knee 01/13/2019   Primary osteoarthritis of right knee 01/13/2019   Flat foot 11/28/2017   Tendonitis of foot 11/28/2017   Polycythemia 05/12/2017   Vitamin D deficiency 04/21/2017   Bony sclerosis 08/05/2016   Lung nodule    Chronic pain of both knees 12/05/2015   Gastroesophageal reflux disease without esophagitis 05/17/2014   Essential hypertension 05/17/2014   Coronary artery calcification 08/17/2012   Abnormal LFTs (liver function tests) 07/11/2012   Chronic abdominal pain 06/24/2012   Right-sided chest wall pain 06/24/2012   Constipation,  chronic 06/24/2012   Hyperbilirubinemia 06/24/2012   Chronic back pain 06/24/2012   DDD (degenerative disc disease), thoracolumbar 06/24/2012   History of nephrolithiasis 06/24/2012   Side pain 06/24/2012   Low back pain at multiple sites 06/24/2012    ONSET DATE: 07/17/21 (date of OT order)  REFERRING DIAG: M25.531 (ICD-10-CM) - Pain in right wrist M25.511 (ICD-10-CM) - Acute pain of right shoulder   THERAPY DIAG:  Hemiplegia and hemiparesis following cerebral infarction affecting right dominant side (HCC)  Muscle weakness (generalized)  Acute pain of right shoulder  Pain in right wrist  Other symptoms and signs involving the nervous system  Rationale for Evaluation and Treatment Rehabilitation  SUBJECTIVE:   SUBJECTIVE STATEMENT: "It's okay" Pt accompanied by: self  PAIN: Are you having pain? No  PERTINENT HISTORY: L frontoparietal CVA w/ residual R-sided weakness, presented to ED 04/27/21 after recent prolonged travel; PMH includes h/o polycythemia, HTN  PRECAUTIONS: Fall  PLOF: Independent and Vocation/Vocational requirements: retired Architect  PATIENT GOALS "Work on my shoulder"   OBJECTIVE:   ------------------------------------------------------------------------------------------------------------------------------------------------------  UPPER EXTREMITY ROM: LUE WFL; RUE measurements below  Active ROM Right Eval - 6/26 Right 7/20  Shoulder flexion Unable 30%  Shoulder abduction Unable 40%  Shoulder adduction WFL   Shoulder extension 24   Shoulder internal rotation [facility]   Shoulder external rotation Unable Unable due to pain  Elbow  flexion WFL   Elbow extension 33 90%  Wrist flexion 27 80%  Wrist extension 28   Wrist pronation To be assessed   Wrist supination To be assessed   (Blank rows = not tested)  UPPER EXTREMITY MMT:     MMT Right Eval - 6/26 Right 7/20  Shoulder flexion 1/5 2-/5  Shoulder abduction 1/5 2+/5  Shoulder extension  3-/5   Shoulder internal rotation 2/5   Shoulder external rotation 2-/5   Elbow flexion 3/5 3/5  Elbow extension 2-/5 2+/5  Wrist flexion 2-/5 2+/5  Wrist extension 2-/5 2+/5  Wrist pronation 2+/5 2+/5  Wrist supination 2+/5 2+/5  (Blank rows = not tested)  ------------------------------------------------------------------------------------------------------------------------------------------------------  TODAY'S TREATMENT - 09/23/21:  Closed-chain exercises Forward chest press 2x10 in supine w/ 1 lb dowel; OT provided consistent verbal cues and occ tactile assist for consistency w/ arc of motion throughout  AROM of R shoulder ER x10 slowly in supine w/ pillow positioned under upper arm for improved alignment; OT provided active assist within pain-free range for improved ROM  RUE NMR Attempted elbow flex/ext in supine w/ pillow under UE for improved shoulder alignment; able to completed 10x against slight resistance from OT in both planes of movement w/ pt demonstrating good control and strength consistently  Forearm sup/pro while in supine w/ pillow under UE for improved shoulder alignment; OT graded task down to incorporate active assist due to decreased control, particularly w/ supination  Wrist flex/ext x15 while in supine w/ pillow under UE for improved shoulder alignment; OT provided passive stretch at end range of wrist extension for increased ROM and potential increase in motor function. OT able to fade assist of arm positioning w/ pt able to maintain 90 deg of elbow flexion during wrist AROM  Thumb to finger opposition attempted while in supine w/ pillow under UE for improved shoulder alignment and OT providing support at forearm and wrist to prevent compensatory movements and isolate [facility]; able to oppose thumb to digits w/ mild difficulty  Blocks Picking up 1" blocks w/ R hand from inside large bowl and placing on tabletop with min cues for slow/controlled movements and  facilitation of typical movement patterns, particularly decreased shoulder hiking and trunk lateral flexion. OT assisted w/ positioning of bowl when retrieving and provided assist w/ problem-solving for good body mechanics    PATIENT EDUCATION: Ongoing condition-specific education related to therapeutic interventions completed this session and persistent shoulder pain Person educated: Patient Education method: Explanation Education comprehension: verbalized understanding   HOME EXERCISE PROGRAM: Putty Exercises (see pt instructions) MedBridge Code: [id number] - Seated Shoulder Shrugs  - 3 x daily - 7 x weekly - 1 sets - 15 reps - Seated Shoulder Shrug Circles AROM Backward  - 3 x daily - 7 x weekly - 1 sets - 15 reps - Seated Scapular Retraction  - 3 x daily - 7 x weekly - 1 sets - 15 reps - Seated Shoulder External Rotation AAROM with Dowel  - 3 x daily - 7 x weekly - 1 sets - 10 reps - Supine Shoulder Flexion AAROM with Hands Clasped  - 3 x daily - 7 x weekly - 1 sets - 10 reps - Seated Elbow Flexion and Extension AROM  - 3 x daily - 7 x weekly - 2 sets - 10 reps - Seated Forearm Pronation and Supination AROM  - 3 x daily - 7 x weekly - 2 sets - 10 reps - Wrist AROM Flexion Extension  - 3  x daily - 7 x weekly - 2 sets - 10 reps   GOALS: Goals reviewed with patient? Yes  SHORT TERM GOALS: Target date: 09/01/21  STG  Status:  1 Pt will demonstrate understanding of initial HEP designed for RUE ROM Baseline: No HEP at this time Met - 09/02/21  2 Pt to improve R shoulder flexion strength to at least 2-/5 (partial ROM, gravity min) Baseline: shoulder flex MMT: 1/5 Met - 08/25/21  3 Pt will be able to grasp and lift lightweight object to mouth w/ R, dominant UE in at least 2 trials Baseline: Significantly limited gross grasp Met - 08/25/21  4 Pt will achieve at least 50% of R wrist extension AROM against gravity Baseline: partial ROM in horizontal plane Progressing     LONG TERM GOALS:  Target date: 10/03/21  LTG  Status:  1 Pt will improve FOTO score to at least 45 to indicate improved participation in functional activities Baseline: Intake - 4 Progressing  2 Pt will be able to achieve at least partial AROM of R shoulder flexion against gravity by discharge for improved functional use of RUE Baseline: shoulder flex MMT: 1/5 Progressing  3 Pt will be able to complete at least 10 blocks during Box and Blocks Test to indicated improved unilateral GMC of R, dominant UE Baseline: Unable at time of evaluation Progressing  4 Pt will be able to demonstrate incorporation of RUE as gross assist in at least 1 functional task (pulling up pants, stabilizing plate, etc.) Baseline: Not incorporating LUE at gross level at time of eval Progressing     ASSESSMENT:  CLINICAL IMPRESSION: Pt continues to be limited by persistent shoulder pain and decreased functional use of RUE during tasks/activities at home. Pt demonstrates good strength, particularly of elbow, forearm, and wrist, but consistently requires repetition and cues regarding importance of using his RUE and R hand as much as possible, particularly when able to do so w/out pain. OT completed closed- and open-chain RUE exercises while in supine to facilitate scapular alignment w/ retraction for decreased compensatory patterns observed when attempting movement while seated w/ good results. Hemiplegic shoulder pain continues to be likely considering potential muscle imbalance impingement and increased tone/spasticity. Will continue to monitor and focus on [place] and [facility] of L hand until further follow-up w/ ortho MD prn.  PERFORMANCE DEFICITS in functional skills including ADLs, IADLs, coordination, dexterity, proprioception, sensation, tone, ROM, strength, pain, FMC, GMC, mobility, balance, body mechanics, decreased knowledge of use of DME, and UE functional use, cognitive skills including safety awareness, and psychosocial skills including  environmental adaptation.   IMPAIRMENTS are limiting patient from ADLs, IADLs, rest and sleep, and work.   COMORBIDITIES may have co-morbidities  that affects occupational performance. Patient will benefit from skilled OT to address above impairments and improve overall function.   PLAN: OT FREQUENCY: 2x/week  OT DURATION: 8 weeks  PLANNED INTERVENTIONS: self care/ADL training, therapeutic exercise, therapeutic activity, neuromuscular re-education, manual therapy, passive range of motion, balance training, functional mobility training, aquatic therapy, splinting, electrical stimulation, ultrasound, iontophoresis, moist heat, cryotherapy, patient/family education, and DME and/or AE instructions  RECOMMENDED OTHER SERVICES: Currently receiving PT services at this location  CONSULTED AND AGREED WITH PLAN OF CARE: Patient and family member/caregiver  PLAN FOR NEXT SESSION: Continue to target RUE GM and [place], coordination, and strength; wb and somatosensory input for [place], MSOT, OTR/L 09/23/2021, 10:15 AM

## 2021-09-23 NOTE — Telephone Encounter (Signed)
Lvm informing pt.

## 2021-09-24 DIAGNOSIS — M25511 Pain in right shoulder: Secondary | ICD-10-CM | POA: Diagnosis not present

## 2021-09-25 ENCOUNTER — Ambulatory Visit: Payer: Medicare Other | Admitting: Occupational Therapy

## 2021-09-25 ENCOUNTER — Ambulatory Visit: Payer: Medicare Other | Admitting: Physical Therapy

## 2021-09-26 ENCOUNTER — Ambulatory Visit: Payer: Medicare Other | Admitting: Physical Medicine & Rehabilitation

## 2021-09-29 ENCOUNTER — Encounter: Payer: Self-pay | Admitting: Physician Assistant

## 2021-09-29 ENCOUNTER — Ambulatory Visit: Payer: Medicare Other | Admitting: Physical Medicine and Rehabilitation

## 2021-09-29 ENCOUNTER — Ambulatory Visit (INDEPENDENT_AMBULATORY_CARE_PROVIDER_SITE_OTHER): Payer: Medicare Other | Admitting: Physician Assistant

## 2021-09-29 DIAGNOSIS — M7501 Adhesive capsulitis of right shoulder: Secondary | ICD-10-CM | POA: Diagnosis not present

## 2021-09-29 DIAGNOSIS — M7541 Impingement syndrome of right shoulder: Secondary | ICD-10-CM

## 2021-09-29 NOTE — Progress Notes (Signed)
Office Visit Note   Patient: Aaron Dennis           Date of Birth: 1955-10-24           MRN: [id number] Visit Date: 09/29/2021              Requested by: Charlott Rakes, MD [place],  [place] [address] PCP: Charlott Rakes, MD   Assessment & Plan: Visit Diagnoses:  1. Adhesive capsulitis of right shoulder   2. Impingement syndrome of right shoulder     Plan: Given his failure of progress with conservative treatment which is included therapy and a cortisone injection right shoulder.  Recommend right shoulder manipulation under anesthesia and right shoulder arthroscopy with extensive debridement and SAD.  Discussed the procedure with him.  Questions were encouraged and answered by myself and Dr. Ninfa Linden.  We will see him back 1 week postop.  Did discuss with him that he may not gain full range of motion and full strength in the right upper extremity due to the residual effects of his stroke.  Follow-Up Instructions: Return for post op.   Orders:  No orders of the defined types were placed in this encounter.  No orders of the defined types were placed in this encounter.     Procedures: No procedures performed   Clinical Data: No additional findings.   Subjective: Chief Complaint  Patient presents with   Right Shoulder - Pain    HPI Aaron Dennis returns today to go over the MRI of his right shoulder.  He underwent a right shoulder MRI on September 24, 2021.  This showed no frank rotator cuff tear.  He did have supraspinatus tendinosis and fraying of the bursal edge.  Scapularis with mild tendinosis.  And tendinosis of the long head of biceps.  Glenohumeral joint was well-maintained.  He continues to have decreased range of motion and pain in the right shoulder despite injections and therapy. Review of Systems See HPI otherwise negative.  Denies any fevers or chills.  Objective: Vital Signs: There were no vitals taken for this visit.  Physical  Exam General well-developed well-nourished male no acute distress. Ortho Exam Right upper extremity residual weakness secondary to stroke.  Actively and passively I can bring his right shoulder to greater than 90 degrees of forward flexion secondary to pain. Specialty Comments:  No specialty comments available.  Imaging: No results found.   PMFS History: Patient Active Problem List   Diagnosis Date Noted   Aneurysm, cerebral, nonruptured 08/13/2021   Status post stroke due to cerebrovascular disease    Cerebral edema ([place]) 04/29/2021   Stroke (cerebrum) ([place]) 04/29/2021   Right sided weakness    Acute CVA (cerebrovascular accident) ([place]) 04/27/2021   Primary osteoarthritis of left knee 01/13/2019   Primary osteoarthritis of right knee 01/13/2019   Flat foot 11/28/2017   Tendonitis of foot 11/28/2017   Polycythemia 05/12/2017   Vitamin D deficiency 04/21/2017   Bony sclerosis 08/05/2016   Lung nodule    Chronic pain of both knees 12/05/2015   Gastroesophageal reflux disease without esophagitis 05/17/2014   Essential hypertension 05/17/2014   Coronary artery calcification 08/17/2012   Abnormal LFTs (liver function tests) 07/11/2012   Chronic abdominal pain 06/24/2012   Right-sided chest wall pain 06/24/2012   Constipation, chronic 06/24/2012   Hyperbilirubinemia 06/24/2012   Chronic back pain 06/24/2012   DDD (degenerative disc disease), thoracolumbar 06/24/2012   History of nephrolithiasis 06/24/2012   Side pain  06/24/2012   Low back pain at multiple sites 06/24/2012   Past Medical History:  Diagnosis Date   GERD (gastroesophageal reflux disease)    Hypertension    Lung nodule    7 mm LUL nodule    Family History  Problem Relation Age of Onset   Healthy Mother    Diabetes Brother    Colon polyps Neg Hx    Colon cancer Neg Hx    Esophageal cancer Neg Hx    Rectal cancer Neg Hx    Stomach cancer Neg Hx     Past Surgical History:  Procedure Laterality Date    IR RADIOLOGIST EVAL & MGMT  07/02/2021   IR RADIOLOGIST EVAL & MGMT  07/22/2021   Social History   Occupational History   Not on file  Tobacco Use   Smoking status: Former   Smokeless tobacco: Never   Tobacco comments:    quit Jan 2018  Vaping Use   Vaping Use: Never used  Substance and Sexual Activity   Alcohol use: No   Drug use: No   Sexual activity: Not Currently

## 2021-09-30 ENCOUNTER — Ambulatory Visit: Payer: Medicare Other | Admitting: Occupational Therapy

## 2021-09-30 ENCOUNTER — Ambulatory Visit: Payer: Medicare Other

## 2021-10-02 ENCOUNTER — Ambulatory Visit: Payer: Medicare Other | Admitting: Occupational Therapy

## 2021-10-02 ENCOUNTER — Ambulatory Visit: Payer: Medicare Other

## 2021-10-07 ENCOUNTER — Encounter: Payer: Self-pay | Admitting: Internal Medicine

## 2021-10-07 ENCOUNTER — Encounter: Payer: Self-pay | Admitting: Family Medicine

## 2021-10-07 ENCOUNTER — Ambulatory Visit: Payer: Medicare Other | Attending: Family Medicine | Admitting: Family Medicine

## 2021-10-07 ENCOUNTER — Ambulatory Visit: Payer: Medicare Other

## 2021-10-07 ENCOUNTER — Other Ambulatory Visit: Payer: Self-pay

## 2021-10-07 ENCOUNTER — Ambulatory Visit: Payer: Medicare Other | Admitting: Occupational Therapy

## 2021-10-07 VITALS — BP 117/83 | HR 80 | Temp 97.6°F | Ht 70.0 in | Wt 144.2 lb

## 2021-10-07 DIAGNOSIS — K5909 Other constipation: Secondary | ICD-10-CM | POA: Diagnosis not present

## 2021-10-07 DIAGNOSIS — I726 Aneurysm of vertebral artery: Secondary | ICD-10-CM

## 2021-10-07 DIAGNOSIS — F0631 Mood disorder due to known physiological condition with depressive features: Secondary | ICD-10-CM

## 2021-10-07 DIAGNOSIS — I10 Essential (primary) hypertension: Secondary | ICD-10-CM | POA: Diagnosis not present

## 2021-10-07 DIAGNOSIS — Z8673 Personal history of transient ischemic attack (TIA), and cerebral infarction without residual deficits: Secondary | ICD-10-CM | POA: Diagnosis not present

## 2021-10-07 DIAGNOSIS — I69351 Hemiplegia and hemiparesis following cerebral infarction affecting right dominant side: Secondary | ICD-10-CM | POA: Diagnosis not present

## 2021-10-07 DIAGNOSIS — D751 Secondary polycythemia: Secondary | ICD-10-CM

## 2021-10-07 MED ORDER — POLYETHYLENE GLYCOL 3350 17 GM/SCOOP PO POWD
17.0000 g | Freq: Every day | ORAL | 1 refills | Status: AC
  Filled 2021-10-07 – 2021-10-20 (×2): qty 510, 30d supply, fill #0

## 2021-10-07 NOTE — Progress Notes (Signed)
Cbc and potassium check

## 2021-10-07 NOTE — Patient Instructions (Signed)

## 2021-10-07 NOTE — Progress Notes (Signed)
Subjective:  Patient ID: Aaron Dennis, male    DOB: 1956/01/04  Age: 66 y.o. MRN: [id number]  CC: Hospitalization Follow-up   HPI Aaron Dennis is a 66 y.o. year old male with a history of hypertension, polycythemia (s/p phlebotomy in the past), chronic thoracolumbar pain, GERD, CVA of L MCA in 04/2021 here for hospital follow-up.  Interval History:  He is currently under the care of orthopedics for adhesive capsulitis of right shoulder status post cortisone injection.  Notes reviewed recommendation for right shoulder manipulation under anesthesia, right shoulder arthroscopy with extensive debridement and SCD. He has been undergoing PT, OT, right upper extremity still weak and he ambulates with the aid of a walker.  He has a right leg AFO brace which he does not wear. He also saw rehab medicine-Dr. Dagoberto Ligas due to spasticity of right upper extremity and baclofen was initiated with plans for Botox injection but per orthopedics they would be fair cortisone injection over Botox injection. He also had a visit with interventional radiology, Dr. Estanislado Pandy in 07/2021 for follow-up of vertebral artery aneurysm and per notes diagnostic catheter angiogram will be scheduled once he has completed his PT OT. Neurology visit was in 07/2021 at which time he was commenced on gabapentin for intermittent tremors of his right hand.  Today he presents accompanied by his brother.  He is requesting labs to check his CBC and also to check a basic metabolic panel as he did have hypokalemia at his last ED visit when he had presented with constipation.  He still has constipation and uses MiraLAX intermittently. Depression is stable and he denies suicidal ideations or intents, is currently on Celexa. Past Medical History:  Diagnosis Date   GERD (gastroesophageal reflux disease)    Hypertension    Lung nodule    7 mm LUL nodule    Past Surgical History:  Procedure Laterality Date   IR RADIOLOGIST EVAL & MGMT   07/02/2021   IR RADIOLOGIST EVAL & MGMT  07/22/2021    Family History  Problem Relation Age of Onset   Healthy Mother    Diabetes Brother    Colon polyps Neg Hx    Colon cancer Neg Hx    Esophageal cancer Neg Hx    Rectal cancer Neg Hx    Stomach cancer Neg Hx     Social History   Socioeconomic History   Marital status: Married    Spouse name: Not on file   Number of children: Not on file   Years of education: Not on file   Highest education level: Not on file  Occupational History   Not on file  Tobacco Use   Smoking status: Former   Smokeless tobacco: Never   Tobacco comments:    quit Jan 2018  Vaping Use   Vaping Use: Never used  Substance and Sexual Activity   Alcohol use: No   Drug use: No   Sexual activity: Not Currently  Other Topics Concern   Not on file  Social History Narrative   Not on file   Social Determinants of Health   Financial Resource Strain: Not on file  Food Insecurity: Not on file  Transportation Needs: Not on file  Physical Activity: Not on file  Stress: Not on file  Social Connections: Not on file    Allergies  Allergen Reactions   Beef-Derived Products    Fish-Derived Products     Does not eat fish   Pork-Derived Products Other (See Comments)  Cultural preference     Outpatient Medications Prior to Visit  Medication Sig Dispense Refill   acetaminophen (TYLENOL) 325 MG tablet Take 1-2 tablets (325-650 mg total) by mouth every 4 (four) hours as needed for mild pain.     amLODipine (NORVASC) 5 MG tablet Take 1 tablet (5 mg total) by mouth daily. 90 tablet 1   aspirin 81 MG EC tablet Take 1 tablet (81 mg total) by mouth daily. Swallow whole. 90 tablet 1   baclofen (LIORESAL) 10 MG tablet Take 0.5 tablets (5 mg total) by mouth 3 (three) times daily. X 1 week, then 10 mg 3x/day x 1 week; then IF needed, can go to 15 mg 3x/day- for spasticity 130 each 5   citalopram (CELEXA) 20 MG tablet Take 1 tablet (20 mg total) by mouth daily.  90 tablet 1   gabapentin (NEURONTIN) 300 MG capsule Take 1 capsule (300 mg total) by mouth 3 (three) times daily. 90 capsule 2   omeprazole (PRILOSEC) 40 MG capsule Take 1 capsule (40 mg total) by mouth daily. 90 capsule 1   rosuvastatin (CRESTOR) 20 MG tablet Take 1 tablet (20 mg total) by mouth daily. 90 tablet 1   tamsulosin (FLOMAX) 0.4 MG CAPS capsule Take 1 capsule (0.4 mg total) by mouth daily. 90 capsule 1   vitamin B-12 (CYANOCOBALAMIN) 1000 MCG tablet Take 1 tablet (1,000 mcg total) by mouth daily. 90 tablet 1   polyethylene glycol powder (GLYCOLAX/MIRALAX) 17 GM/SCOOP powder Take 17 grams by mouth daily. 3350 g 1   No facility-administered medications prior to visit.     ROS Review of Systems  Constitutional:  Negative for activity change and appetite change.  HENT:  Negative for sinus pressure and sore throat.   Respiratory:  Negative for chest tightness, shortness of breath and wheezing.   Cardiovascular:  Negative for chest pain and palpitations.  Gastrointestinal:  Positive for constipation. Negative for abdominal distention and abdominal pain.  Genitourinary: Negative.   Musculoskeletal: Negative.   Psychiatric/Behavioral:  Negative for behavioral problems and dysphoric mood.     Objective:  BP 117/83   Pulse 80   Temp 97.6 F (36.4 C) (Oral)   Ht '5\' 10"'$  (1.778 m)   Wt 144 lb 3.2 oz (65.4 kg)   SpO2 100%   BMI 20.69 kg/m      10/07/2021    9:24 AM 09/09/2021   11:54 AM 09/03/2021    3:15 PM  BP/Weight  Systolic BP 017 494 496  Diastolic BP 83 91 91  Wt. (Lbs) 144.2    BMI 20.69 kg/m2        Physical Exam Constitutional:      Appearance: He is well-developed.  Cardiovascular:     Rate and Rhythm: Normal rate.     Heart sounds: Normal heart sounds. No murmur heard. Pulmonary:     Effort: Pulmonary effort is normal.     Breath sounds: Normal breath sounds. No wheezing or rales.  Chest:     Chest wall: No tenderness.  Abdominal:     General: Bowel  sounds are normal. There is no distension.     Palpations: Abdomen is soft. There is no mass.     Tenderness: There is no abdominal tenderness.  Musculoskeletal:     Right lower leg: No edema.     Left lower leg: No edema.     Comments: Spasticity of the right upper extremity with decreased strength  Neurological:     Mental Status: He  is alert and oriented to person, place, and time.     Comments: Reduced strength in right lower extremity-4/5 Normal strength in left lower extremity-5/5  Psychiatric:        Mood and Affect: Mood normal.        Latest Ref Rng & Units 09/03/2021    9:36 AM 09/02/2021    5:56 PM 06/25/2021    8:10 AM  CMP  Glucose 70 - 99 mg/dL 101  94  97   BUN 8 - 23 mg/dL '14  18  13   '$ Creatinine 0.61 - 1.24 mg/dL 0.80  0.88  0.91   Sodium 135 - 145 mmol/L 140  141  137   Potassium 3.5 - 5.1 mmol/L 3.1  3.1  3.5   Chloride 98 - 111 mmol/L 106  107  103   CO2 22 - 32 mmol/L '24  27  27   '$ Calcium 8.9 - 10.3 mg/dL 9.0  9.3  9.5   Total Protein 6.5 - 8.1 g/dL 7.3  7.6  7.5   Total Bilirubin 0.3 - 1.2 mg/dL 1.9  1.7  2.4   Alkaline Phos 38 - 126 U/L 112  103  133   AST 15 - 41 U/L '19  18  21   '$ ALT 0 - 44 U/L '16  16  22     '$ Lipid Panel     Component Value Date/Time   CHOL 159 04/27/2021 1649   CHOL 198 06/26/2020 0944   TRIG 52 04/27/2021 1649   HDL 29 (L) 04/27/2021 1649   HDL 38 (L) 06/26/2020 0944   CHOLHDL 5.5 04/27/2021 1649   VLDL 10 04/27/2021 1649   LDLCALC 120 (H) 04/27/2021 1649   LDLCALC 147 (H) 06/26/2020 0944    CBC    Component Value Date/Time   WBC 8.2 09/03/2021 0936   RBC 5.55 09/03/2021 0936   HGB 15.1 09/03/2021 0936   HGB 15.2 07/24/2021 0830   HGB 17.8 (H) 06/26/2020 0944   HGB 16.5 03/13/2009 0931   HCT 45.7 09/03/2021 0936   HCT 54.5 (H) 06/26/2020 0944   HCT 49.8 03/13/2009 0931   PLT 228 09/03/2021 0936   PLT 230 07/24/2021 0830   PLT 181 06/26/2020 0944   MCV 82.3 09/03/2021 0936   MCV 83 06/26/2020 0944   MCV 82.2  03/13/2009 0931   MCH 27.2 09/03/2021 0936   MCHC 33.0 09/03/2021 0936   RDW 13.4 09/03/2021 0936   RDW 16.3 (H) 06/26/2020 0944   RDW 15.5 (H) 03/13/2009 0931   LYMPHSABS 2.0 07/24/2021 0830   LYMPHSABS 2.2 06/26/2020 0944   LYMPHSABS 2.2 03/13/2009 0931   MONOABS 0.4 07/24/2021 0830   MONOABS 0.5 03/13/2009 0931   EOSABS 0.0 07/24/2021 0830   EOSABS 0.0 06/26/2020 0944   BASOSABS 0.0 07/24/2021 0830   BASOSABS 0.0 06/26/2020 0944   BASOSABS 0.0 03/13/2009 0931    Lab Results  Component Value Date   HGBA1C 4.9 04/27/2021      10/07/2021    9:30 AM 08/13/2021    9:45 AM 06/30/2021   11:26 AM 06/11/2021   10:21 AM 08/14/2020    2:48 PM  Depression screen PHQ 2/9  Decreased Interest 2 0 1 1 0  Down, Depressed, Hopeless 1 0 2 1 0  PHQ - 2 Score 3 0 3 2 0  Altered sleeping 0  2 1 0  Tired, decreased energy 2  2 0 0  Change in appetite 1  2 0  0  Feeling bad or failure about yourself  '1  2 1 1  '$ Trouble concentrating 0  '2 1 1  '$ Moving slowly or fidgety/restless 0  '3 2 1  '$ Suicidal thoughts 0  0 0 0  PHQ-9 Score '7  16 7 3     '$ Assessment & Plan:  1. Status post stroke due to cerebrovascular disease With residual right-sided hemiparesis Associated with a history of capsulitis status post cortisone injection being worked up for manipulation under anesthesia Continue PT OT - Basic metabolic panel - CBC with Differential  2. Other constipation Counseled on increasing fiber intake, fruits and vegetable, limit intake of foods like cheese, white bread, white rice - polyethylene glycol powder (GLYCOLAX/MIRALAX) 17 GM/SCOOP powder; Take 17 grams by mouth daily.  Dispense: 510 g; Refill: 1  3. Essential hypertension Controlled Continue atorvastatin Counseled on blood pressure goal of less than 130/80, low-sodium, DASH diet, medication compliance, 150 minutes of moderate intensity exercise per week. Discussed medication compliance, adverse effects.   4. Vertebral artery aneurysm  [name])  Per interventional radiology diagnostic catheter angiogram to be performed once PT OT sessions are completed  5. Hemiparesis affecting right side as late effect of cerebrovascular accident (CVA) ([place]) Continue with PT OT Use right AFO brace Fall precaution  6. Polycythemia Last hemoglobin was normal at 15.2 Followed by hematology  7. Depression due to physical illness Stable Continue Celexa   Meds ordered this encounter  Medications   polyethylene glycol powder (GLYCOLAX/MIRALAX) 17 GM/SCOOP powder    Sig: Take 17 grams by mouth daily.    Dispense:  3350 g    Refill:  1    Follow-up: Return in about 3 months (around 01/07/2022) for Chronic medical conditions.       Charlott Rakes, MD, FAAFP. [facility] [place], [place]   10/07/2021, 10:08 AM

## 2021-10-08 ENCOUNTER — Inpatient Hospital Stay: Payer: Medicare Other | Admitting: Family Medicine

## 2021-10-08 LAB — BASIC METABOLIC PANEL
BUN/Creatinine Ratio: 12 (ref 10–24)
BUN: 9 mg/dL (ref 8–27)
CO2: 23 mmol/L (ref 20–29)
Calcium: 9.2 mg/dL (ref 8.6–10.2)
Chloride: 101 mmol/L (ref 96–106)
Creatinine, Ser: 0.78 mg/dL (ref 0.76–1.27)
Glucose: 78 mg/dL (ref 70–99)
Potassium: 3.9 mmol/L (ref 3.5–5.2)
Sodium: 140 mmol/L (ref 134–144)
eGFR: 98 mL/min/{1.73_m2} (ref >59)

## 2021-10-08 LAB — CBC WITH DIFFERENTIAL/PLATELET
Basophils Absolute: 0.1 10*3/uL (ref 0.0–0.2)
Basos: 1 %
EOS (ABSOLUTE): 0.1 10*3/uL (ref 0.0–0.4)
Eos: 1 %
Hematocrit: 49.6 % (ref 37.5–51.0)
Hemoglobin: 16.3 g/dL (ref 13.0–17.7)
Immature Grans (Abs): 0 10*3/uL (ref 0.0–0.1)
Immature Granulocytes: 0 %
Lymphocytes Absolute: 2.1 10*3/uL (ref 0.7–3.1)
Lymphs: 34 %
MCH: 27.3 pg (ref 26.6–33.0)
MCHC: 32.9 g/dL (ref 31.5–35.7)
MCV: 83 fL (ref 79–97)
Monocytes Absolute: 0.4 10*3/uL (ref 0.1–0.9)
Monocytes: 7 %
Neutrophils Absolute: 3.4 10*3/uL (ref 1.4–7.0)
Neutrophils: 57 %
Platelets: 226 10*3/uL (ref 150–450)
RBC: 5.96 x10E6/uL — ABNORMAL HIGH (ref 4.14–5.80)
RDW: 14.2 % (ref 11.6–15.4)
WBC: 6 10*3/uL (ref 3.4–10.8)

## 2021-10-09 ENCOUNTER — Ambulatory Visit: Payer: Medicare Other | Admitting: Occupational Therapy

## 2021-10-09 ENCOUNTER — Ambulatory Visit: Payer: Medicare Other | Admitting: Physical Therapy

## 2021-10-14 ENCOUNTER — Telehealth: Payer: Self-pay | Admitting: *Deleted

## 2021-10-14 ENCOUNTER — Other Ambulatory Visit: Payer: Self-pay

## 2021-10-14 NOTE — Patient Outreach (Signed)
  Care Coordination   10/14/2021 Name: Aaron Dennis MRN: [id number] DOB: 08-18-1955   Care Coordination Outreach Attempts:  An unsuccessful telephone outreach was attempted today to offer the patient information about available care coordination services as a benefit of their health plan.   Follow Up Plan:  Additional outreach attempts will be made to offer the patient care coordination information and services.   Encounter Outcome:  Pt. Request to Call Back  Care Coordination Interventions Activated:  No   Care Coordination Interventions:  No, not indicated    Emelia Loron RN, BSN [place] [phone] [email or web address]

## 2021-10-15 ENCOUNTER — Telehealth: Payer: Self-pay | Admitting: Family Medicine

## 2021-10-15 NOTE — Telephone Encounter (Signed)
Copied from [place] [phone]. Topic: General - Other >> Oct 15, 2021  2:57 PM [name] wrote: Reason for CRM: The patient has been directed by their orthopedic specialist to request surgical clearance   The patient was told by their orthopedic specialist that their request for clearance was submitted more than 3 weeks ago   The patient would like to speak with a member of staff when possible  Please contact the patient further when available

## 2021-10-15 NOTE — Telephone Encounter (Signed)
Clearance form has been faxed and patient has been left a VM informing patient that paperwork has been faxed.

## 2021-10-19 DIAGNOSIS — Q782 Osteopetrosis: Secondary | ICD-10-CM | POA: Diagnosis not present

## 2021-10-19 DIAGNOSIS — R531 Weakness: Secondary | ICD-10-CM | POA: Diagnosis not present

## 2021-10-19 DIAGNOSIS — I639 Cerebral infarction, unspecified: Secondary | ICD-10-CM | POA: Diagnosis not present

## 2021-10-19 DIAGNOSIS — M549 Dorsalgia, unspecified: Secondary | ICD-10-CM | POA: Diagnosis not present

## 2021-10-19 DIAGNOSIS — M25561 Pain in right knee: Secondary | ICD-10-CM | POA: Diagnosis not present

## 2021-10-20 ENCOUNTER — Encounter: Payer: Self-pay | Admitting: Internal Medicine

## 2021-10-20 ENCOUNTER — Other Ambulatory Visit: Payer: Self-pay

## 2021-10-21 ENCOUNTER — Telehealth (HOSPITAL_COMMUNITY): Payer: Self-pay | Admitting: Student

## 2021-10-21 ENCOUNTER — Telehealth (HOSPITAL_COMMUNITY): Payer: Self-pay | Admitting: Vascular Surgery

## 2021-10-21 ENCOUNTER — Telehealth (HOSPITAL_COMMUNITY): Payer: Self-pay | Admitting: Radiology

## 2021-10-21 NOTE — Telephone Encounter (Signed)
NIR received phone call from Orthopedic office asking if it's ok to schedule patient for Orthopedic procedure under general anesthesia. Patient with prior imaging showing: Moderate stenosis of the distal left intradural vertebral artery, mid basilar artery and proximal left P2 PCA. 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  Patient met with Dr. Estanislado Pandy on two separate occasions earlier this year to discuss next steps in treatment/management options. Patient was to be scheduled for diagnostic cerebral angiogram but this was never done.   NIR unable to make a recommendation as to the safety of performing a procedure under general anesthesia given that we have not performed a diagnostic cerebral angiogram.   NIR recommended to the Orthopedic office to either check with the patient's neurologist or encourage patient to schedule diagnostic cerebral angiogram for further work up.   Soyla Dryer, [place] [phone] 10/21/2021, 1:44 PM

## 2021-10-21 NOTE — Telephone Encounter (Signed)
Addendum to prior note from today  Dr. Estanislado Pandy states that given the information we currently have (CTA from March 2023) the aneurysm does not need to be treated prior to other surgeries/treatments.   Soyla Dryer, [place] [phone] 10/21/2021, 2:18 PM

## 2021-10-21 NOTE — Progress Notes (Signed)
Contacted earlier today by surgery scheduler Sherrie with [employer]. [name] is being considered for right shoulder manipulation under anesthesia and right shoulder arthroscopy with extensive debridement and SAD by Dr. Ninfa Linden. He was reportedly not felt to be a candidate at the surgical center at present because he has pending IR follow for a 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 seen on 04/27/21 CTA head/neck during work-up for acute left frontoparietal infarct. He has had consultation with IR Luanne Bras, MD, last on 07/22/21, to discuss future diagnostic catheter arteriogram to more accurately identify the etiology of his aneurysm and tailor appropriate management. Scheduling was delayed while he was completing PT/OT.  He is nearly six months post CVA, and his PCP has reportedly cleared him for surgery.  Advised his surgical team to communicate with IR Dr. Estanislado Pandy regarding input for timing of these procedures.   Myra Gianotti, PA-C Surgical Short Stay/Anesthesiology [facility] Phone [phone] [facility] Phone [phone] 10/21/2021 2:34 PM

## 2021-10-21 NOTE — Telephone Encounter (Signed)
Spoke with Sherri at patient's ortho office. After speaking with Dr. Estanislado Pandy the patient's aneurysm does not have to be treated before he has his shoulder surgery. We can schedule that after this procedure. JM

## 2021-10-22 ENCOUNTER — Other Ambulatory Visit: Payer: Self-pay

## 2021-10-22 ENCOUNTER — Encounter (HOSPITAL_COMMUNITY): Payer: Self-pay | Admitting: Orthopaedic Surgery

## 2021-10-22 NOTE — Progress Notes (Addendum)
For Short Stay: COVID SWAB appointment date: N/A Date of COVID positive in last 90 days: N/A  Bowel Prep reminder: N/A   For Anesthesia: PCP - Charlott Rakes, MD last office visit note 10/07/21, surgical clearance 10/10/21 in chart  Cardiologist - Nahser, Wonda Cheng, MD remote history not currently seeing Radiologist-Dr. Estanislado Pandy  Neurologist-Lomax, Amy, NP last office visit note 07/29/21  Chest x-ray - N/A EKG - 09/03/21 in epic Stress Test - N/A ECHO - 04/27/21 in epic Cardiac Cath - N/A Pacemaker/ICD device last checked:N/A Pacemaker orders received: N/A Device Rep notified: N/A  Spinal Cord Stimulator: N/A  Sleep Study - N/A CPAP - N/A  Fasting Blood Sugar - N/A Checks Blood Sugar __N/A___ times a day Date and result of last Hgb A1c-N/A  Blood Thinner Instructions: N/A Aspirin Instructions: Yes Last Dose: 10/20/21  Activity level: Can do activities of daily living without stopping and without chest pain and/or shortness of breath    Anesthesia review: 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, acute left frontoparietal infarct  Patient denies shortness of breath, fever, cough and chest pain at PAT appointment   Patient verbalized understanding of instructions that were given to them at the PAT appointment. Patient was also instructed that they will need to review over the PAT instructions again at home before surgery.

## 2021-10-23 ENCOUNTER — Ambulatory Visit (HOSPITAL_BASED_OUTPATIENT_CLINIC_OR_DEPARTMENT_OTHER): Payer: Medicare Other | Admitting: Physician Assistant

## 2021-10-23 ENCOUNTER — Other Ambulatory Visit (HOSPITAL_COMMUNITY): Payer: Self-pay

## 2021-10-23 ENCOUNTER — Other Ambulatory Visit: Payer: Self-pay

## 2021-10-23 ENCOUNTER — Encounter (HOSPITAL_COMMUNITY)
Admission: RE | Disposition: A | Discharge status: Home / Self Care | Payer: Self-pay | Source: Home / Self Care | Attending: Orthopaedic Surgery

## 2021-10-23 ENCOUNTER — Encounter (HOSPITAL_COMMUNITY): Payer: Self-pay | Admitting: Orthopaedic Surgery

## 2021-10-23 ENCOUNTER — Ambulatory Visit (HOSPITAL_COMMUNITY)
Admission: RE | Admit: 2021-10-23 | Discharge: 2021-10-23 | Disposition: A | Discharge status: Home / Self Care | Payer: Medicare Other | Attending: Orthopaedic Surgery | Admitting: Orthopaedic Surgery

## 2021-10-23 ENCOUNTER — Ambulatory Visit (HOSPITAL_COMMUNITY): Payer: Medicare Other | Admitting: Physician Assistant

## 2021-10-23 ENCOUNTER — Encounter: Payer: Self-pay | Admitting: Internal Medicine

## 2021-10-23 DIAGNOSIS — I69351 Hemiplegia and hemiparesis following cerebral infarction affecting right dominant side: Secondary | ICD-10-CM | POA: Diagnosis not present

## 2021-10-23 DIAGNOSIS — I10 Essential (primary) hypertension: Secondary | ICD-10-CM | POA: Insufficient documentation

## 2021-10-23 DIAGNOSIS — I739 Peripheral vascular disease, unspecified: Secondary | ICD-10-CM | POA: Insufficient documentation

## 2021-10-23 DIAGNOSIS — K219 Gastro-esophageal reflux disease without esophagitis: Secondary | ICD-10-CM | POA: Diagnosis not present

## 2021-10-23 DIAGNOSIS — Z87891 Personal history of nicotine dependence: Secondary | ICD-10-CM

## 2021-10-23 DIAGNOSIS — G8918 Other acute postprocedural pain: Secondary | ICD-10-CM | POA: Diagnosis not present

## 2021-10-23 DIAGNOSIS — F32A Depression, unspecified: Secondary | ICD-10-CM | POA: Insufficient documentation

## 2021-10-23 DIAGNOSIS — M1990 Unspecified osteoarthritis, unspecified site: Secondary | ICD-10-CM | POA: Diagnosis not present

## 2021-10-23 DIAGNOSIS — I6990 Unspecified sequelae of unspecified cerebrovascular disease: Secondary | ICD-10-CM | POA: Diagnosis not present

## 2021-10-23 DIAGNOSIS — Z8673 Personal history of transient ischemic attack (TIA), and cerebral infarction without residual deficits: Secondary | ICD-10-CM | POA: Diagnosis not present

## 2021-10-23 DIAGNOSIS — Z79899 Other long term (current) drug therapy: Secondary | ICD-10-CM | POA: Insufficient documentation

## 2021-10-23 DIAGNOSIS — M24611 Ankylosis, right shoulder: Secondary | ICD-10-CM

## 2021-10-23 HISTORY — DX: Depression, unspecified: F32.A

## 2021-10-23 HISTORY — PX: SHOULDER ARTHROSCOPY WITH SUBACROMIAL DECOMPRESSION: SHX5684

## 2021-10-23 HISTORY — DX: Secondary polycythemia: D75.1

## 2021-10-23 HISTORY — DX: Aneurysm of vertebral artery: I72.6

## 2021-10-23 HISTORY — DX: Vitamin D deficiency, unspecified: E55.9

## 2021-10-23 HISTORY — DX: Constipation, unspecified: K59.00

## 2021-10-23 SURGERY — SHOULDER ARTHROSCOPY WITH SUBACROMIAL DECOMPRESSION
Anesthesia: General | Laterality: Right

## 2021-10-23 MED ORDER — ROCURONIUM BROMIDE 10 MG/ML (PF) SYRINGE
PREFILLED_SYRINGE | INTRAVENOUS | Status: DC | PRN
  Administered 2021-10-23: 60 mg via INTRAVENOUS

## 2021-10-23 MED ORDER — PROPOFOL 10 MG/ML IV BOLUS
INTRAVENOUS | Status: AC
  Filled 2021-10-23: qty 20

## 2021-10-23 MED ORDER — MIDAZOLAM HCL 2 MG/2ML IJ SOLN: 0.5000 mg | Freq: Once | INTRAMUSCULAR | Status: DC | PRN

## 2021-10-23 MED ORDER — SUGAMMADEX SODIUM 200 MG/2ML IV SOLN
INTRAVENOUS | Status: DC | PRN
  Administered 2021-10-23: 200 mg via INTRAVENOUS

## 2021-10-23 MED ORDER — EPINEPHRINE PF 1 MG/ML IJ SOLN
INTRAMUSCULAR | Status: AC
  Filled 2021-10-23: qty 2

## 2021-10-23 MED ORDER — PROPOFOL 10 MG/ML IV BOLUS
INTRAVENOUS | Status: DC | PRN
  Administered 2021-10-23: 120 mg via INTRAVENOUS
  Administered 2021-10-23: 30 mg via INTRAVENOUS
  Administered 2021-10-23: 20 mg via INTRAVENOUS
  Administered 2021-10-23: 30 mg via INTRAVENOUS

## 2021-10-23 MED ORDER — FENTANYL CITRATE PF 50 MCG/ML IJ SOSY
50.0000 ug | PREFILLED_SYRINGE | INTRAMUSCULAR | Status: DC
  Administered 2021-10-23: 50 ug via INTRAVENOUS
  Filled 2021-10-23: qty 2

## 2021-10-23 MED ORDER — ORAL CARE MOUTH RINSE: 15.0000 mL | Freq: Once | OROMUCOSAL | Status: AC

## 2021-10-23 MED ORDER — MIDAZOLAM HCL 2 MG/2ML IJ SOLN
1.0000 mg | INTRAMUSCULAR | Status: DC
  Administered 2021-10-23: 1 mg via INTRAVENOUS

## 2021-10-23 MED ORDER — BUPIVACAINE-EPINEPHRINE (PF) 0.5% -1:200000 IJ SOLN
INTRAMUSCULAR | Status: DC | PRN
  Administered 2021-10-23: 10 mL via PERINEURAL

## 2021-10-23 MED ORDER — OXYCODONE HCL 5 MG PO TABS: 5.0000 mg | ORAL_TABLET | Freq: Once | ORAL | Status: DC | PRN

## 2021-10-23 MED ORDER — CEFAZOLIN SODIUM-DEXTROSE 2-4 GM/100ML-% IV SOLN
2.0000 g | INTRAVENOUS | Status: AC
  Administered 2021-10-23: 2 g via INTRAVENOUS
  Filled 2021-10-23: qty 100

## 2021-10-23 MED ORDER — OXYCODONE HCL 5 MG/5ML PO SOLN: 5.0000 mg | Freq: Once | ORAL | Status: DC | PRN

## 2021-10-23 MED ORDER — HYDROCODONE-ACETAMINOPHEN 5-325 MG PO TABS
1.0000 | ORAL_TABLET | Freq: Four times a day (QID) | ORAL | 0 refills | Status: DC | PRN
  Filled 2021-10-23: qty 28, 7d supply, fill #0

## 2021-10-23 MED ORDER — MIDAZOLAM HCL 2 MG/2ML IJ SOLN
INTRAMUSCULAR | Status: AC
  Filled 2021-10-23: qty 2

## 2021-10-23 MED ORDER — ACETAMINOPHEN 500 MG PO TABS
1000.0000 mg | ORAL_TABLET | Freq: Once | ORAL | Status: AC
  Administered 2021-10-23: 1000 mg via ORAL
  Filled 2021-10-23: qty 2

## 2021-10-23 MED ORDER — CHLORHEXIDINE GLUCONATE 0.12 % MT SOLN
15.0000 mL | Freq: Once | OROMUCOSAL | Status: AC
  Administered 2021-10-23: 15 mL via OROMUCOSAL

## 2021-10-23 MED ORDER — LIDOCAINE 2% (20 MG/ML) 5 ML SYRINGE
INTRAMUSCULAR | Status: DC | PRN
  Administered 2021-10-23: 40 mg via INTRAVENOUS

## 2021-10-23 MED ORDER — MEPERIDINE HCL 50 MG/ML IJ SOLN: 6.2500 mg | INTRAMUSCULAR | Status: DC | PRN

## 2021-10-23 MED ORDER — ONDANSETRON HCL 4 MG/2ML IJ SOLN
INTRAMUSCULAR | Status: AC
  Filled 2021-10-23: qty 2

## 2021-10-23 MED ORDER — DEXAMETHASONE SODIUM PHOSPHATE 10 MG/ML IJ SOLN
INTRAMUSCULAR | Status: AC
  Filled 2021-10-23: qty 1

## 2021-10-23 MED ORDER — EPHEDRINE SULFATE-NACL 50-0.9 MG/10ML-% IV SOSY
PREFILLED_SYRINGE | INTRAVENOUS | Status: DC | PRN
  Administered 2021-10-23: 10 mg via INTRAVENOUS
  Administered 2021-10-23 (×4): 5 mg via INTRAVENOUS

## 2021-10-23 MED ORDER — PHENYLEPHRINE 80 MCG/ML (10ML) SYRINGE FOR IV PUSH (FOR BLOOD PRESSURE SUPPORT)
PREFILLED_SYRINGE | INTRAVENOUS | Status: AC
  Filled 2021-10-23: qty 10

## 2021-10-23 MED ORDER — LACTATED RINGERS IV SOLN
INTRAVENOUS | Status: DC
Start: 2021-10-23 — End: 2021-10-23

## 2021-10-23 MED ORDER — ROCURONIUM BROMIDE 10 MG/ML (PF) SYRINGE
PREFILLED_SYRINGE | INTRAVENOUS | Status: AC
  Filled 2021-10-23: qty 10

## 2021-10-23 MED ORDER — EPINEPHRINE PF 1 MG/ML IJ SOLN
INTRAMUSCULAR | Status: DC | PRN
  Administered 2021-10-23: 2 mL via SUBCUTANEOUS

## 2021-10-23 MED ORDER — PHENYLEPHRINE HCL-NACL 20-0.9 MG/250ML-% IV SOLN
INTRAVENOUS | Status: DC | PRN
  Administered 2021-10-23: 40 ug/min via INTRAVENOUS

## 2021-10-23 MED ORDER — PROMETHAZINE HCL 25 MG/ML IJ SOLN: 6.2500 mg | INTRAMUSCULAR | Status: DC | PRN

## 2021-10-23 MED ORDER — DEXAMETHASONE SODIUM PHOSPHATE 10 MG/ML IJ SOLN
INTRAMUSCULAR | Status: DC | PRN
  Administered 2021-10-23: 4 mg via INTRAVENOUS

## 2021-10-23 MED ORDER — BUPIVACAINE LIPOSOME 1.3 % IJ SUSP
INTRAMUSCULAR | Status: DC | PRN
  Administered 2021-10-23: 10 mL via PERINEURAL

## 2021-10-23 MED ORDER — HYDROMORPHONE HCL 1 MG/ML IJ SOLN: 0.2500 mg | INTRAMUSCULAR | Status: DC | PRN

## 2021-10-23 MED ORDER — ONDANSETRON HCL 4 MG/2ML IJ SOLN
INTRAMUSCULAR | Status: DC | PRN
  Administered 2021-10-23: 4 mg via INTRAVENOUS

## 2021-10-23 MED ORDER — SODIUM CHLORIDE 0.9 % IR SOLN
Status: DC | PRN
  Administered 2021-10-23: 6000 mL
  Administered 2021-10-23: 3000 mL

## 2021-10-23 MED ORDER — LIDOCAINE HCL (PF) 2 % IJ SOLN
INTRAMUSCULAR | Status: AC
  Filled 2021-10-23: qty 5

## 2021-10-23 MED ORDER — BUPIVACAINE-EPINEPHRINE (PF) 0.5% -1:200000 IJ SOLN
INTRAMUSCULAR | Status: AC
  Filled 2021-10-23: qty 30

## 2021-10-23 SURGICAL SUPPLY — 41 items
AID PSTN UNV HD RSTRNT DISP (MISCELLANEOUS) ×1
BAG COUNTER SPONGE SURGICOUNT (BAG) IMPLANT
BAG SPNG CNTER NS LX DISP (BAG)
BLADE SURG SZ11 CARB STEEL (BLADE) ×1 IMPLANT
BURR OVAL 8 FLU 5.0X13 (MISCELLANEOUS) IMPLANT
CANNULA TWIST IN 8.25X7CM (CANNULA) ×1 IMPLANT
COVER SURGICAL LIGHT HANDLE (MISCELLANEOUS) ×1 IMPLANT
DISSECTOR 4.0MM X 13CM (MISCELLANEOUS) IMPLANT
DRAPE SHOULDER BEACH CHAIR (DRAPES) ×1 IMPLANT
DRAPE U-SHAPE 47X51 STRL (DRAPES) ×2 IMPLANT
DURAPREP 26ML APPLICATOR (WOUND CARE) ×1 IMPLANT
GAUZE PAD ABD 8X10 STRL (GAUZE/BANDAGES/DRESSINGS) ×2 IMPLANT
GAUZE SPONGE 4X4 12PLY STRL (GAUZE/BANDAGES/DRESSINGS) ×1 IMPLANT
GAUZE XEROFORM 1X8 LF (GAUZE/BANDAGES/DRESSINGS) ×1 IMPLANT
GLOVE BIO SURGEON STRL SZ7.5 (GLOVE) ×1 IMPLANT
GLOVE BIOGEL PI IND STRL 8 (GLOVE) ×2 IMPLANT
GLOVE ECLIPSE 8.0 STRL XLNG CF (GLOVE) ×1 IMPLANT
GOWN STRL REUS W/ TWL XL LVL3 (GOWN DISPOSABLE) ×2 IMPLANT
GOWN STRL REUS W/TWL XL LVL3 (GOWN DISPOSABLE) ×2
KIT BASIN OR (CUSTOM PROCEDURE TRAY) ×1 IMPLANT
KIT POSITION SHOULDER SCHLEI (MISCELLANEOUS) ×1 IMPLANT
KIT SHOULDER TRACTION (DRAPES) ×1 IMPLANT
MANIFOLD NEPTUNE II (INSTRUMENTS) ×1 IMPLANT
NDL SCORPION MULTI FIRE (NEEDLE) IMPLANT
NDL SPNL 18GX3.5 QUINCKE PK (NEEDLE) ×1 IMPLANT
NEEDLE SCORPION MULTI FIRE (NEEDLE) IMPLANT
NEEDLE SPNL 18GX3.5 QUINCKE PK (NEEDLE) ×1 IMPLANT
PACK SHOULDER (CUSTOM PROCEDURE TRAY) ×1 IMPLANT
PORT APPOLLO RF 90DEGREE MULTI (SURGICAL WAND) IMPLANT
PROTECTOR NERVE ULNAR (MISCELLANEOUS) ×1 IMPLANT
RESTRAINT HEAD UNIVERSAL NS (MISCELLANEOUS) IMPLANT
SLING ARM IMMOBILIZER LRG (SOFTGOODS) IMPLANT
SLING ARM IMMOBILIZER MED (SOFTGOODS) IMPLANT
SUT ETHILON 0 48 LOOP BLK (SUTURE) ×1 IMPLANT
SUT ETHILON 3 0 PS 1 (SUTURE) IMPLANT
SUT ETHILON 4 0 PS 2 18 (SUTURE) ×1 IMPLANT
TAPE PAPER 1/2X10 TAN MEDIPORE (MISCELLANEOUS) IMPLANT
TOWEL OR 17X26 10 PK STRL BLUE (TOWEL DISPOSABLE) ×1 IMPLANT
TOWEL OR NON WOVEN STRL DISP B (DISPOSABLE) ×1 IMPLANT
TUBING ARTHROSCOPY IRRIG 16FT (MISCELLANEOUS) ×1 IMPLANT
TUBING CONNECTING 10 (TUBING) ×1 IMPLANT

## 2021-10-23 NOTE — Anesthesia Procedure Notes (Signed)
Procedure Name: Intubation Date/Time: 10/23/2021 8:52 AM  Performed by: Milford Cage, CRNAPre-anesthesia Checklist: Patient identified, Emergency Drugs available, Suction available and Patient being monitored Patient Re-evaluated:Patient Re-evaluated prior to induction Oxygen Delivery Method: Circle system utilized Preoxygenation: Pre-oxygenation with 100% oxygen Induction Type: IV induction Ventilation: Mask ventilation without difficulty Laryngoscope Size: Miller and 2 Grade View: Grade I Tube type: Oral Tube size: 7.5 mm Number of attempts: 1 Airway Equipment and Method: Stylet Placement Confirmation: ETT inserted through vocal cords under direct vision, positive ETCO2 and breath sounds checked- equal and bilateral Secured at: 24 cm Tube secured with: Tape Dental Injury: Teeth and Oropharynx as per pre-operative assessment

## 2021-10-23 NOTE — Anesthesia Procedure Notes (Signed)
Anesthesia Regional Block: Interscalene brachial plexus block   Pre-Anesthetic Checklist: , timeout performed,  Correct Patient, Correct Site, Correct Laterality,  Correct Procedure, Correct Position, site marked,  Risks and benefits discussed,  Surgical consent,  Pre-op evaluation,  At surgeon's request and post-op pain management  Laterality: Right and Upper  Prep: chloraprep       Needles:  Injection technique: Single-shot  Needle Type: Echogenic Needle     Needle Length: 9cm  Needle Gauge: 21     Additional Needles:   Procedures:,,,, ultrasound used (permanent image in chart),,    Narrative:  Start time: 10/23/2021 8:26 AM End time: 10/23/2021 8:32 AM Injection made incrementally with aspirations every 5 mL.  Performed by: Personally  Anesthesiologist: Annye Asa, MD  Additional Notes: Pt identified in Holding room.  Monitors applied. Working IV access confirmed. Sterile prep R clavicle and neck.  #21ga ECHOgenic Arrow block needle to interscalene brachial plexus with US guidance.  10cc 0.5% Bupivacaine 1:200k epi, Exparel injected incrementally after negative test dose.  Patient asymptomatic, VSS, no heme aspirated, tolerated well.    Jenita Seashore, MD

## 2021-10-23 NOTE — Anesthesia Postprocedure Evaluation (Signed)
Anesthesia Post Note  Patient: Aaron Dennis  Procedure(s) Performed: RIGHT SHOULDER ARTHROSCOPY WITH DEBRIDEMENT, SUBACROMIAL DECOMPRESSION, MANIPULATION UNDER ANESTHESIA (Right)     Patient location during evaluation: PACU Anesthesia Type: General Level of consciousness: awake and alert, patient cooperative and oriented Pain management: pain level controlled Vital Signs Assessment: post-procedure vital signs reviewed and stable Respiratory status: spontaneous breathing, nonlabored ventilation and respiratory function stable Cardiovascular status: blood pressure returned to baseline and stable Postop Assessment: no apparent nausea or vomiting Anesthetic complications: no   No notable events documented.  Last Vitals:  Vitals:   10/23/21 1025 10/23/21 1036  BP: 115/77 115/76  Pulse: 85 84  Resp: 17 18  Temp: 36.5 C 36.5 C  SpO2: 98% 100%    Last Pain:  Vitals:   10/23/21 1036  TempSrc: Oral  PainSc: 0-No pain                 Helaina Stefano,E. Ashyla Luth

## 2021-10-23 NOTE — Anesthesia Preprocedure Evaluation (Addendum)
Anesthesia Evaluation  Patient identified by MRN, date of birth, ID band Patient awake    Reviewed: Allergy & Precautions, NPO status , Patient's Chart, lab work & pertinent test results  History of Anesthesia Complications Negative for: history of anesthetic complications  Airway Mallampati: II  TM Distance: >3 FB Neck ROM: Full    Dental  (+) Dental Advisory Given, Teeth Intact, Missing   Pulmonary former smoker,  Lung nodule   breath sounds clear to auscultation       Cardiovascular hypertension, Pt. on medications (-) angina+ Peripheral Vascular Disease   Rhythm:Regular Rate:Normal  04/2021 ECHO: EF 60-65%. LV has normal function, no regional wall motion abnormalities. Grade I DD, no significant valvular abnormalities   Neuro/Psych Depression CVA (R sided weakness), Residual Symptoms    GI/Hepatic Neg liver ROS, GERD  Medicated and Controlled,  Endo/Other  negative endocrine ROS  Renal/GU negative Renal ROS     Musculoskeletal  (+) Arthritis ,   Abdominal   Peds  Hematology negative hematology ROS (+)   Anesthesia Other Findings   Reproductive/Obstetrics                            Anesthesia Physical Anesthesia Plan  ASA: 3  Anesthesia Plan: General   Post-op Pain Management: Tylenol PO (pre-op)* and Regional block*   Induction: Intravenous  PONV Risk Score and Plan: 2 and Ondansetron and Dexamethasone  Airway Management Planned: Oral ETT  Additional Equipment: None  Intra-op Plan:   Post-operative Plan: Extubation in OR  Informed Consent: I have reviewed the patients History and Physical, chart, labs and discussed the procedure including the risks, benefits and alternatives for the proposed anesthesia with the patient or authorized representative who has indicated his/her understanding and acceptance.     Dental advisory given  Plan Discussed with: CRNA and  Surgeon  Anesthesia Plan Comments: (Plan routine monitors, GETA with interscalene block for post op analgesia)       Anesthesia Quick Evaluation

## 2021-10-23 NOTE — H&P (Signed)
Aaron Dennis is an 66 y.o. male.   Chief Complaint: Right shoulder stiffness, decreased range of motion and weakness HPI: The patient is a 66 year old gentleman who has a history of a stroke affecting his right side.  He has now developed significant right shoulder arthrofibrosis and stiffness with pain and weakness.  We have tried conservative treatment with steroid injections and physical therapy.  At this point we recommended arthroscopic intervention with manipulation of the shoulder and a lysis of adhesions with extensive debridement to see if we get the shoulder moving better.  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 He has never used smokeless tobacco. He reports that he does not drink alcohol and does not use drugs.  Allergies:  Allergies  Allergen Reactions   Beef-Derived Products     Cultural preference   Fish-Derived Products     Does not eat fish   Pork-Derived Products Other (See Comments)    Cultural preference     Medications Prior to Admission  Medication Sig Dispense Refill   acetaminophen (TYLENOL) 325 MG tablet Take 1-2 tablets (325-650 mg total) by mouth every 4 (four) hours as needed for mild  pain.     amLODipine (NORVASC) 5 MG tablet Take 1 tablet (5 mg total) by mouth daily. 90 tablet 1   aspirin 81 MG EC tablet Take 1 tablet (81 mg total) by mouth daily. Swallow whole. 90 tablet 1   citalopram (CELEXA) 20 MG tablet Take 1 tablet (20 mg total) by mouth daily. 90 tablet 1   omeprazole (PRILOSEC) 40 MG capsule Take 1 capsule (40 mg total) by mouth daily. (Patient taking differently: Take 40 mg by mouth daily as needed (acid reflux).) 90 capsule 1   polyethylene glycol powder (GLYCOLAX/MIRALAX) 17 GM/SCOOP powder Take 17 grams by mouth daily. (Patient taking differently: Take 17 g by mouth daily as needed for moderate constipation.) 510 g 1   rosuvastatin (CRESTOR) 20 MG tablet Take 1 tablet (20 mg total) by mouth daily. 90 tablet 1   tamsulosin (FLOMAX) 0.4 MG CAPS capsule Take 1 capsule (0.4 mg total) by mouth daily. 90 capsule 1   baclofen (LIORESAL) 10 MG tablet Take 0.5 tablets (5 mg total) by mouth 3 (three) times daily. X 1 week, then 10 mg 3x/day x 1 week; then IF needed, can go to 15 mg 3x/day- for spasticity (Patient not taking: Reported on 10/22/2021) 130 each 5   gabapentin (NEURONTIN) 300 MG capsule Take 1 capsule (300 mg total) by mouth  3 (three) times daily. (Patient not taking: Reported on 10/22/2021) 90 capsule 2   vitamin B-12 (CYANOCOBALAMIN) 1000 MCG tablet Take 1 tablet (1,000 mcg total) by mouth daily. (Patient not taking: Reported on 10/22/2021) 90 tablet 1    No results found for this or any previous visit (from the past 48 hour(s)). No results found.  Review of Systems  Blood pressure (!) 141/94, pulse 71, temperature 97.7 F (36.5 C), temperature source Oral, resp. rate 18, height '5\' 10"'$  (1.778 m), weight 68 kg, SpO2 100 %. Physical Exam Vitals reviewed.  Constitutional:      Appearance: Normal appearance.  HENT:     Head: Normocephalic and atraumatic.  Eyes:     Pupils: Pupils are equal, round, and reactive to light.  Cardiovascular:     Rate and  Rhythm: Normal rate.     Pulses: Normal pulses.  Pulmonary:     Effort: Pulmonary effort is normal.     Breath sounds: Normal breath sounds.  Abdominal:     Palpations: Abdomen is soft.  Musculoskeletal:     Right shoulder: Tenderness and bony tenderness present. Decreased range of motion. Decreased strength.     Cervical back: Normal range of motion and neck supple.  Neurological:     Mental Status: He is alert and oriented to person, place, and time.  Psychiatric:        Behavior: Behavior normal.      Assessment/Plan Right shoulder severe arthrofibrosis  The plan will be to proceed to surgery today to manipulate his right shoulder under anesthesia and the block.  We would then proceed with an arthroscopic intervention with attempts to release any tissue and remove any scar tissue that are impeding the mobility of the patient right shoulder.  We had a long and thorough discussion with the patient and his family about the risk and benefits of the surgery and given the failure conservative treatment and his continued condition he does wish to proceed with surgery as an outpatient.  The risks and benefits of surgery were explained in detail and informed consent has been obtained.  The right operative shoulder has been marked.  Mcarthur Rossetti, MD 10/23/2021, 7:50 AM

## 2021-10-23 NOTE — Transfer of Care (Signed)
Immediate Anesthesia Transfer of Care Note  Patient: Aaron Dennis  Procedure(s) Performed: RIGHT SHOULDER ARTHROSCOPY WITH DEBRIDEMENT, SUBACROMIAL DECOMPRESSION, MANIPULATION UNDER ANESTHESIA (Right)  Patient Location: PACU  Anesthesia Type:General  Level of Consciousness: drowsy  Airway & Oxygen Therapy: Patient Spontanous Breathing and Patient connected to face mask oxygen  Post-op Assessment: Report given to RN and Post -op Vital signs reviewed and stable  Post vital signs: Reviewed and stable  Last Vitals:  Vitals Value Taken Time  BP 129/78 10/23/21 0955  Temp 36.5 C 10/23/21 0955  Pulse 92 10/23/21 0955  Resp 21 10/23/21 0955  SpO2 100 % 10/23/21 0955  Vitals shown include unvalidated device data.  Last Pain:  Vitals:   10/23/21 0815  TempSrc: Oral  PainSc: 0-No pain         Complications: No notable events documented.

## 2021-10-23 NOTE — Op Note (Signed)
Operative Note  Date of surgery: 10/23/2021 Preoperative diagnosis: Right shoulder severe arthrofibrosis and weakness status post stroke Postoperative diagnosis: Same  Procedure: #1 right shoulder manipulation under anesthesia, #2 right shoulder arthroscopy with extensive debridement and subacromial decompression with lysis of adhesions  Surgeon: Lind Guest. Ninfa Linden, MD  Anesthesia: #1 regional right shoulder block, #2 General Antibiotics: 2 g IV Ancef Blood loss: Less than 25 cc Complications: None  Indications: The patient is a 66 year old gentleman who unfortunately sustained a stroke which affected his right side.  Over time he is developed right shoulder arthrofibrosis with limited motion and pain.  An MRI was obtained of the right shoulder showing just thickened tissue around the joint capsule and in the axillary recess as well as tendinosis of the rotator cuff.  At this point he has tried physical therapy and injections and we recommended a manipulation under anesthesia with an arthroscopic intervention for release in the anterior capsule with lysis of adhesions and subacromial decompression.  The risk and benefits of surgery been explained in detail and informed consent is obtained.  Procedure description: After informed consent was obtained and appropriate right shoulder was marked, anesthesia obtained a right shoulder regional block in the holding room.  The patient was then brought to the operating room and placed upon the operating table.  General anesthesia was obtained.  He was then fashioned into a beachchair position with appropriate positioning the head neck and padding of the down nonoperative left arm.  There was bending at the waist and knees and sequential compression devices placed on his legs.  His right operative shoulder was then assessed and manipulated.  Before manipulation he had only about 90 degrees of forward flexion and I did increase that by at least 10 to 15  degrees..  His external rotation was also almost full after manipulation.  We then prepped the right shoulder with DuraPrep and sterile drapes.  His right shoulder was placed in a fishing pole traction device with 10 pounds of traction and neutral rotation with 45 degrees of forward flexion.  A posterior lateral arthroscopy portal was then made and the glenohumeral joint was entered.  There was significant scarring of tissue of the anterior capsule and the anterior labrum.  There was inflamed tissue throughout the rotator interval.  I did make a separate portal anteriorly in the rotator interval and placed a soft tissue ablation wand as well as off scopic shaver to perform a significant debridement which was extensive with lysis of adhesions of the anterior capsule removing as much scar tissue as I could from around the glenohumeral joint and the axillary recess.  The subscapularis and biceps tendon were intact and the right undersurface of the rotator cuff just had inflamed tissue but was intact.  Once the extensive debridement in the glenohumeral joint was carried out I entered the subacromial space of the posterior portal made a separate lateral portal.  We then performed a subacromial decompression using a soft tissue ablation wand arthroscopic shaver to remove thickened tissue off of the undersurface of the acromion and the surface of the rotator cuff.  We then removed all irritation from the shoulder and closed the all 3 portal sites with interrupted nylon suture.  Xeroform and well-padded sterile dressing was applied and the patient was placed in a right shoulder sling.  He was awakened extubated taken recovery in stable condition.

## 2021-10-23 NOTE — Discharge Instructions (Signed)
Do expect right shoulder swelling and bloody drainage from the 3 incisions in your right shoulder. Leave the dressings on for the next 1 to 2 days. Come out of your sling several times daily to work on getting your shoulder moving. Try to stop using the sling in about 3 days. After 1 to 2 days, you can remove your dressings and get your incisions wet in the shower. You can place a Band-Aid over each incision daily after you shower. Ice intermittently as needed throughout the next several days for swelling.

## 2021-10-24 ENCOUNTER — Encounter (HOSPITAL_COMMUNITY): Payer: Self-pay | Admitting: Orthopaedic Surgery

## 2021-10-30 ENCOUNTER — Ambulatory Visit (INDEPENDENT_AMBULATORY_CARE_PROVIDER_SITE_OTHER): Payer: Medicare Other | Admitting: Orthopaedic Surgery

## 2021-10-30 ENCOUNTER — Encounter: Payer: Self-pay | Admitting: Orthopaedic Surgery

## 2021-10-30 DIAGNOSIS — Z9889 Other specified postprocedural states: Secondary | ICD-10-CM

## 2021-10-30 DIAGNOSIS — M7541 Impingement syndrome of right shoulder: Secondary | ICD-10-CM

## 2021-10-30 DIAGNOSIS — M7501 Adhesive capsulitis of right shoulder: Secondary | ICD-10-CM

## 2021-10-30 NOTE — Progress Notes (Signed)
The patient is here status post a right shoulder arthroscopy with lysis of adhesions and releasing his joint capsule trying to get his right shoulder moving better.  He has had significant limitations in his right shoulder mobility since having a stroke and has significant weakness overall.  We were able to manipulate his shoulder but were not able to get full abduction or extension of his shoulder and I do not think we will be able to as a result of his stroke.  Distally his motor and sensory function is minimal of that right upper extremity.  His incisions look good to remove the sutures.  He would like to transition to outpatient physical therapy.  [facility] would [facility] [place] opposed [place]  We will see if we get this set up with any modalities per the therapist discretion to decrease his right shoulder pain and help with move better.  We will see him back in 4 weeks to see how he is doing overall.

## 2021-10-31 NOTE — Addendum Note (Signed)
Addended by: Robyne Peers on: 10/31/2021 01:42 PM   Modules accepted: Orders

## 2021-11-05 NOTE — Patient Instructions (Signed)
Below is our plan:  We will continue to monitor. Please continue close follow up with PCP and specialists. Consider trial of gabapentin as directed by Dr Magnus Ivan. Return to PT next week. Continue aspirin and rosuvastatin as prescribed by PCP. Call Dr Corliss Skains when ready for angiogram.   Please make sure you are staying well hydrated. I recommend 50-60 ounces daily. Well balanced diet and regular exercise encouraged. Consistent sleep schedule with 6-8 hours recommended.   Please continue follow up with care team as directed.   Follow up with me as needed   You may receive a survey regarding today's visit. I encourage you to leave honest feed back as I do use this information to improve patient care. Thank you for seeing me today!

## 2021-11-05 NOTE — Progress Notes (Unsigned)
No chief complaint on file.   HISTORY OF PRESENT ILLNESS:  11/05/21 ALL:  Aaron Dennis returns for follow up post CVA.   He continues asa 103m and rosuvastatin 251mdaily. He is followed closely by Dr NeMargarita RanaPCP.   He is status post right shoulder arthroscopy, debridement and subacromial decompression 10/23/2021. He is followed by Dr BlNinfa Lindenortho.   07/29/2021 ALL: Aaron FLINCHUMs a [age].o. male here today for follow up. He was seen 07/02/2021 for hospital follow up following left frontal parietal infarct with residual right upper and lower ext weakness. He underwent inpatient rehab then discharged to home PT. At last visit he reported improving strengths of right upper and lower ext. He was working with home PT/OT. He was seeing Dr ShCurlene Dolphinrehab, for right shoulder pain. Injection declined and he was started on gabapentin and meloxicam. He takes meloxicam as needed but has not started gabapentin.   Since, he is doing fairly well. He continues to have right upper > left lower weakness. He completed first round of outpatient PT/OT but was reordered due to continued RUE weakness and right shoulder pain. He is scheduled to start therapy next week. He has noticed mild shaking of right hand over the past 2-3 weeks. He notices shaking when trying to raise his hand. He denies resting tremor. Shaking does not interfere with functioning. He continues to ambulate with quad walker. PT recommended AFO brace for right foot drop. He has been fitted but not received brace.   He continues close follow up with PCP. He reports BP is well managed at home. He continues asa 8159mnd rosuvastatin 70m105mily. No stroke like symptoms.   He was seen by Dr DeveEstanislado Pandy3/2023 for monitoring of intradural right vertebral artery aneurysm. Diagnostic arteriogram planned to be performed following completion of PT/OT.   HISTORY (copied from my previous note)  Aaron Dennis 65 y3. who  has a past medical history  of GERD (gastroesophageal reflux disease), Hypertension, and Lung nodule.  Patient presented on 04/27/2021 with fie days of waxing and waning right sided weakness without other deficits. CT negative. MR brain showed acute left frontoparietal infarct with mild associated edema without mass affect with findings consistent with chronic microvascular disease. CTA showed moderate stenosis of distal L intradural vertebral artery with 4x3mm 26murysm, low risk for rupture. He was started on Plavix and asa 81mg 14mweeks then Plavix alone. During hospitalization he lost motor function of right upper ext, RLE remained at 3/5 strength. He was discharged to [facility]. Personally reviewed hospitalization pertinent progress notes, lab work and imaging.  Evaluated by Dr Xu.   Aaron Dennis was admitted to rehab 3/21. He underwent extensive therapies 5 days a week. He had improvement in right sided weakness and ability to compensate for deficits and discharged home 05/20/2021. Plavix was discontinued 05/19/2021. He continues asa 81mg. 27me being home, he has continued home PT/OT. He is able to walk with quad cane. His brother has helped take care of him. He lives with his wife and two adult children.   He was seen by PCP 06/11/2021. BP was controlled on Norvasc 5mg. He67ms swithced in hospital from Lipitor 10mg to 67mtor 70mg. Rig57mided weakness improving but reported right shoulder pain. Meloxicam 7.5mg daily 43mscribed. Celexa and trazodone continued for depression and sleep. He was referred to IR for monitoring of aneurysm. Referral to neurology as well for stroke follow up.   He was seen  in follow up with Dr Curlene Dolphin, outpatient rehab 06/30/2021. He was offered right shoulder injection for pain with subluxation but declined. He was started on gabapentin 35m titrating to TID dosing. He has not started this medication.   He is followed by hem/onc for polycythemia. He is planning to repeat bone marrow biopsy and lab  testing due to previous concerns of skeletal lesions concerning for myeloproliferative disease.    REVIEW OF SYSTEMS: Out of a complete 14 system review of symptoms, the patient complains only of the following symptoms, right arm weakness, shaking of right arm, and all other reviewed systems are negative.   ALLERGIES: Allergies  Allergen Reactions   Beef-Derived Products     Cultural preference   Fish-Derived Products     Does not eat fish   Pork-Derived Products Other (See Comments)    Cultural preference      HOME MEDICATIONS: Outpatient Medications Prior to Visit  Medication Sig Dispense Refill   acetaminophen (TYLENOL) 325 MG tablet Take 1-2 tablets (325-650 mg total) by mouth every 4 (four) hours as needed for mild pain.     amLODipine (NORVASC) 5 MG tablet Take 1 tablet (5 mg total) by mouth daily. 90 tablet 1   aspirin 81 MG EC tablet Take 1 tablet (81 mg total) by mouth daily. Swallow whole. 90 tablet 1   baclofen (LIORESAL) 10 MG tablet Take 0.5 tablets (5 mg total) by mouth 3 (three) times daily. X 1 week, then 10 mg 3x/day x 1 week; then IF needed, can go to 15 mg 3x/day- for spasticity (Patient not taking: Reported on 10/22/2021) 130 each 5   citalopram (CELEXA) 20 MG tablet Take 1 tablet (20 mg total) by mouth daily. 90 tablet 1   gabapentin (NEURONTIN) 300 MG capsule Take 1 capsule (300 mg total) by mouth 3 (three) times daily. (Patient not taking: Reported on 10/22/2021) 90 capsule 2   HYDROcodone-acetaminophen (NORCO) 5-325 MG tablet Take 1 tablet by mouth every 6 (six) hours as needed for moderate pain. 30 tablet 0   omeprazole (PRILOSEC) 40 MG capsule Take 1 capsule (40 mg total) by mouth daily. (Patient taking differently: Take 40 mg by mouth daily as needed (acid reflux).) 90 capsule 1   polyethylene glycol powder (GLYCOLAX/MIRALAX) 17 GM/SCOOP powder Take 17 grams by mouth daily. (Patient taking differently: Take 17 g by mouth daily as needed for moderate  constipation.) 510 g 1   rosuvastatin (CRESTOR) 20 MG tablet Take 1 tablet (20 mg total) by mouth daily. 90 tablet 1   tamsulosin (FLOMAX) 0.4 MG CAPS capsule Take 1 capsule (0.4 mg total) by mouth daily. 90 capsule 1   vitamin B-12 (CYANOCOBALAMIN) 1000 MCG tablet Take 1 tablet (1,000 mcg total) by mouth daily. (Patient not taking: Reported on 10/22/2021) 90 tablet 1   No facility-administered medications prior to visit.     PAST MEDICAL HISTORY: Past Medical History:  Diagnosis Date   Constipation    Depression    GERD (gastroesophageal reflux disease)    Hypertension    Lung nodule    7 mm LUL nodule   Polycythemia    [place]   acute left frontoparietal infarct, residual right-sided hemiparesis   Vertebral artery aneurysm (HCC)    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   Vitamin D deficiency      PAST SURGICAL HISTORY: Past Surgical History:  Procedure Laterality Date   IR RADIOLOGIST EVAL & MGMT  07/02/2021   IR RADIOLOGIST EVAL &  MGMT  07/22/2021   LIPOMA EXCISION     SHOULDER ARTHROSCOPY WITH SUBACROMIAL DECOMPRESSION Right 10/23/2021   Procedure: RIGHT SHOULDER ARTHROSCOPY WITH DEBRIDEMENT, SUBACROMIAL DECOMPRESSION, MANIPULATION UNDER ANESTHESIA;  Surgeon: Mcarthur Rossetti, MD;  Location: [facility];  Service: Orthopedics;  Laterality: Right;     FAMILY HISTORY: Family History  Problem Relation Age of Onset   Healthy Mother    Diabetes Brother    Colon polyps Neg Hx    Colon cancer Neg Hx    Esophageal cancer Neg Hx    Rectal cancer Neg Hx    Stomach cancer Neg Hx      SOCIAL HISTORY: Social History   Socioeconomic History   Marital status: Married    Spouse name: Not on file   Number of children: Not on file   Years of education: Not on file   Highest education level: Not on file  Occupational History   Not on file  Tobacco Use   Smoking status: Former   Smokeless tobacco: Never   Tobacco comments:    quit Jan 2018   Vaping Use   Vaping Use: Never used  Substance and Sexual Activity   Alcohol use: No   Drug use: No   Sexual activity: Not Currently  Other Topics Concern   Not on file  Social History Narrative   Not on file   Social Determinants of Health   Financial Resource Strain: Not on file  Food Insecurity: Not on file  Transportation Needs: Not on file  Physical Activity: Not on file  Stress: Not on file  Social Connections: Not on file  Intimate Partner Violence: Not on file     PHYSICAL EXAM  There were no vitals filed for this visit.  There is no height or weight on file to calculate BMI.  Generalized: Well developed, in no acute distress  Cardiology: normal rate and rhythm, no murmur auscultated  Respiratory: clear to auscultation bilaterally    Neurological examination  Mentation: Alert oriented to time, place, history taking. Follows all commands speech and language fluent Cranial nerve II-XII: Pupils were equal round reactive to light. Extraocular movements were full, visual field were full on confrontational test. Facial sensation and strength were normal. Head turning and shoulder shrug  were normal and symmetric. Motor: The motor testing reveals 5 over 5 strength of left upper and lower ext, right upper 3+/5 proximal, 2/5 distally, right lower 4/5 Sensory: Sensory testing is intact to soft touch on all 4 extremities. No evidence of extinction is noted.  Coordination: Cerebellar testing reveals good finger-nose-finger and heel-to-shin on left, unable to perform right upper ext, right lower reduced.  Gait and station: uses left arm to push to standing position, hemiplegic gait, right foot drop noted, fairly stable with quad cane    DIAGNOSTIC DATA (LABS, IMAGING, TESTING) - I reviewed patient records, labs, notes, testing and imaging myself where available.  Lab Results  Component Value Date   WBC 6.0 10/07/2021   HGB 16.3 10/07/2021   HCT 49.6 10/07/2021   MCV 83  10/07/2021   PLT 226 10/07/2021      Component Value Date/Time   NA 140 10/07/2021 1024   K 3.9 10/07/2021 1024   CL 101 10/07/2021 1024   CO2 23 10/07/2021 1024   GLUCOSE 78 10/07/2021 1024   GLUCOSE 101 (H) 09/03/2021 0936   BUN 9 10/07/2021 1024   CREATININE 0.78 10/07/2021 1024   CREATININE 0.91 06/25/2021 0810   CREATININE 1.02 12/05/2015  1109   CALCIUM 9.2 10/07/2021 1024   PROT 7.3 09/03/2021 0936   PROT 6.9 06/26/2020 0944   ALBUMIN 3.8 09/03/2021 0936   ALBUMIN 4.3 06/26/2020 0944   AST 19 09/03/2021 0936   AST 21 06/25/2021 0810   ALT 16 09/03/2021 0936   ALT 22 06/25/2021 0810   ALKPHOS 112 09/03/2021 0936   BILITOT 1.9 (H) 09/03/2021 0936   BILITOT 2.4 (H) 06/25/2021 0810   GFRNONAA >60 09/03/2021 0936   GFRNONAA >60 06/25/2021 0810   GFRNONAA 80 12/05/2015 1109   GFRAA 93 11/29/2018 1453   GFRAA >60 04/28/2017 1231   GFRAA >89 12/05/2015 1109   Lab Results  Component Value Date   CHOL 159 04/27/2021   HDL 29 (L) 04/27/2021   LDLCALC 120 (H) 04/27/2021   TRIG 52 04/27/2021   CHOLHDL 5.5 04/27/2021   Lab Results  Component Value Date   HGBA1C 4.9 04/27/2021   Lab Results  Component Value Date   VITAMINB12 173 (L) 08/14/2020   Lab Results  Component Value Date   TSH 1.500 06/26/2020        No data to display               No data to display           ASSESSMENT AND PLAN  66 y.o. year old male  has a past medical history of Constipation, Depression, GERD (gastroesophageal reflux disease), Hypertension, Lung nodule, Polycythemia, Stroke ([place]) (05/01/2021), Vertebral artery aneurysm ([place]), and Vitamin D deficiency. here with    No diagnosis found.  Aaron Dennis is doing fairly well. He continues to have right upper ext weakness and has noticed mild shaking of right hand. Symptoms seem to have appeared since last PT visit. He has not had therapy in 3-4 weeks but is scheduled to restart therapy next week. Very mild action tremor  noted of right hand. I recommend continued PT/OT. We have discussed possible relationship with shoulder pain. I have encouraged him to continue close follow up with rehab and consider starting gabapentin as directed. He will continue asa and statin as directed by PCP. Stroke prevention reviewed. We will send orders to [facility] for right AFO d/t right foot drop s/p CVA. Healthy lifestyle habits encouraged. He will follow up with PCP as directed. He will return to see me in 3 months, sooner if needed. He verbalizes understanding and agreement with this plan.   No orders of the defined types were placed in this encounter.    No orders of the defined types were placed in this encounter.    Debbora Presto, MSN, FNP-C 11/05/2021, 12:55 PM  [facility] [address], [place], [place] [address] [phone]

## 2021-11-06 ENCOUNTER — Ambulatory Visit (INDEPENDENT_AMBULATORY_CARE_PROVIDER_SITE_OTHER): Payer: Medicare Other | Admitting: Family Medicine

## 2021-11-06 ENCOUNTER — Encounter: Payer: Self-pay | Admitting: Internal Medicine

## 2021-11-06 ENCOUNTER — Encounter: Payer: Self-pay | Admitting: Family Medicine

## 2021-11-06 VITALS — BP 125/85 | HR 67 | Ht 70.0 in | Wt 157.0 lb

## 2021-11-06 DIAGNOSIS — G8929 Other chronic pain: Secondary | ICD-10-CM | POA: Diagnosis not present

## 2021-11-06 DIAGNOSIS — I63412 Cerebral infarction due to embolism of left middle cerebral artery: Secondary | ICD-10-CM

## 2021-11-06 DIAGNOSIS — M21371 Foot drop, right foot: Secondary | ICD-10-CM

## 2021-11-06 DIAGNOSIS — R531 Weakness: Secondary | ICD-10-CM | POA: Diagnosis not present

## 2021-11-06 DIAGNOSIS — M25511 Pain in right shoulder: Secondary | ICD-10-CM

## 2021-11-06 DIAGNOSIS — Z9181 History of falling: Secondary | ICD-10-CM

## 2021-11-14 ENCOUNTER — Ambulatory Visit: Payer: Medicare Other | Admitting: Physical Therapy

## 2021-11-18 DIAGNOSIS — M25561 Pain in right knee: Secondary | ICD-10-CM | POA: Diagnosis not present

## 2021-11-18 DIAGNOSIS — M549 Dorsalgia, unspecified: Secondary | ICD-10-CM | POA: Diagnosis not present

## 2021-11-18 DIAGNOSIS — R531 Weakness: Secondary | ICD-10-CM | POA: Diagnosis not present

## 2021-11-18 DIAGNOSIS — I639 Cerebral infarction, unspecified: Secondary | ICD-10-CM | POA: Diagnosis not present

## 2021-11-18 DIAGNOSIS — Q782 Osteopetrosis: Secondary | ICD-10-CM | POA: Diagnosis not present

## 2021-11-19 ENCOUNTER — Encounter: Payer: Self-pay | Admitting: Physical Therapy

## 2021-11-19 ENCOUNTER — Ambulatory Visit: Payer: Medicare Other | Attending: Physician Assistant | Admitting: Physical Therapy

## 2021-11-19 DIAGNOSIS — M25511 Pain in right shoulder: Secondary | ICD-10-CM

## 2021-11-19 DIAGNOSIS — M6281 Muscle weakness (generalized): Secondary | ICD-10-CM

## 2021-11-19 DIAGNOSIS — R29818 Other symptoms and signs involving the nervous system: Secondary | ICD-10-CM | POA: Diagnosis not present

## 2021-11-19 DIAGNOSIS — Z8673 Personal history of transient ischemic attack (TIA), and cerebral infarction without residual deficits: Secondary | ICD-10-CM | POA: Diagnosis not present

## 2021-11-19 DIAGNOSIS — I69351 Hemiplegia and hemiparesis following cerebral infarction affecting right dominant side: Secondary | ICD-10-CM | POA: Diagnosis not present

## 2021-11-19 DIAGNOSIS — Z9889 Other specified postprocedural states: Secondary | ICD-10-CM | POA: Insufficient documentation

## 2021-11-19 DIAGNOSIS — R293 Abnormal posture: Secondary | ICD-10-CM | POA: Insufficient documentation

## 2021-11-19 DIAGNOSIS — R2689 Other abnormalities of gait and mobility: Secondary | ICD-10-CM | POA: Diagnosis not present

## 2021-11-19 NOTE — Therapy (Signed)
OUTPATIENT PHYSICAL THERAPY SHOULDER EVALUATION   Patient Name: Aaron Dennis MRN: [id number] DOB:07-Aug-1955, 66 y.o., male Today's Date: 11/19/2021   PT End of Session - 11/19/21 0851     Visit Number 1    Date for PT Re-Evaluation 02/19/22    Authorization Type [employer] Medicare    PT Start Time 0845    PT Stop Time 0930    PT Time Calculation (min) 45 min    Activity Tolerance Patient tolerated treatment well    Behavior During Therapy [facility] for tasks assessed/performed             Past Medical History:  Diagnosis Date   Constipation    Depression    GERD (gastroesophageal reflux disease)    Hypertension    Lung nodule    7 mm LUL nodule   Polycythemia    Stroke ([place]) 05/01/2021   acute left frontoparietal infarct, residual right-sided hemiparesis   Vertebral artery aneurysm (HCC)    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   Vitamin D deficiency    Past Surgical History:  Procedure Laterality Date   IR RADIOLOGIST EVAL & MGMT  07/02/2021   IR RADIOLOGIST EVAL & MGMT  07/22/2021   LIPOMA EXCISION     SHOULDER ARTHROSCOPY WITH SUBACROMIAL DECOMPRESSION Right 10/23/2021   Procedure: RIGHT SHOULDER ARTHROSCOPY WITH DEBRIDEMENT, SUBACROMIAL DECOMPRESSION, MANIPULATION UNDER ANESTHESIA;  Surgeon: Mcarthur Rossetti, MD;  Location: [facility];  Service: Orthopedics;  Laterality: Right;   Patient Active Problem List   Diagnosis Date Noted   Arthrofibrosis of right shoulder 10/23/2021   Aneurysm, cerebral, nonruptured 08/13/2021   Status post stroke due to cerebrovascular disease    Cerebral edema ([place]) 04/29/2021   Stroke (cerebrum) ([place]) 04/29/2021   Right sided weakness    Acute CVA (cerebrovascular accident) ([place]) 04/27/2021   Primary osteoarthritis of left knee 01/13/2019   Primary osteoarthritis of right knee 01/13/2019   Flat foot 11/28/2017   Tendonitis of foot 11/28/2017   Polycythemia 05/12/2017   Vitamin D deficiency 04/21/2017   Bony sclerosis  08/05/2016   Lung nodule    Chronic pain of both knees 12/05/2015   Gastroesophageal reflux disease without esophagitis 05/17/2014   Essential hypertension 05/17/2014   Coronary artery calcification 08/17/2012   Abnormal LFTs (liver function tests) 07/11/2012   Chronic abdominal pain 06/24/2012   Right-sided chest wall pain 06/24/2012   Constipation, chronic 06/24/2012   Hyperbilirubinemia 06/24/2012   Chronic back pain 06/24/2012   DDD (degenerative disc disease), thoracolumbar 06/24/2012   History of nephrolithiasis 06/24/2012   Side pain 06/24/2012   Low back pain at multiple sites 06/24/2012    PCP: [name]  REFERRING PROVIDER: [name]  REFERRING DIAG: M25.511  THERAPY DIAG:  Hemiplegia and hemiparesis following cerebral infarction affecting right dominant side (HCC)  Muscle weakness (generalized)  Acute pain of right shoulder  Other abnormalities of gait and mobility  Other symptoms and signs involving the nervous system  Status post stroke due to cerebrovascular disease  Rationale for Evaluation and Treatment Rehabilitation  ONSET DATE: 07/15/21  SUBJECTIVE:  SUBJECTIVE STATEMENT: Had a stroke in March while on the plane, 2 days after coming home he fell going to the bathroom. He stayed in hospital for 24 days and was getting therapy 3hrs a day. He was also getting PT/OT at home but was not getting improvements. He is right hand dominant, he had significant pain in the right shoulder, he underwent a right shoulder arthroscopy 10/23/21, he reports that he is in more pain now PERTINENT HISTORY: Acute CVA 3/19  PAIN:  Are you having pain? Yes: NPRS scale: 8/10 Pain location: R shoulder Pain description: sharp Aggravating factors: movement, sleeping on R  Relieving factors:  Tylenol and meloxicam  can get down to 3/10 with mm rub and sleep  PRECAUTIONS: Fall  WEIGHT BEARING RESTRICTIONS No  FALLS:  Has patient fallen in last 6 months? Yes. Number of falls 4 reports no falls in the past few months  LIVING ENVIRONMENT: Lives with: lives with their family Lives in: House/apartment Stairs: Yes: External: 2 steps; none Has following equipment at home: Single point cane, Hemi walker, Wheelchair (manual), and Grab bars  OCCUPATION: retired  PLOF: [place] try to get stronger, try to use my legs and feet   OBJECTIVE:   DIAGNOSTIC FINDINGS:  Right shoulder 3 views: Shoulder is well located.  Subacromial space well-maintained.  Type II downsloping acromion.  No acute fractures or acute findings Right wrist 3 views: No acute fractures.  No subluxation dislocation of  the carpal bones.  Wrist joints well-maintained.  No bony abnormalities.  PATIENT SURVEYS:  FOTO 4  COGNITION:  Overall cognitive status: Within functional limits for tasks assessed     SENSATION: WFL  POSTURE: Decreased R shoulder height, hemiparesis, forward head, flexed trunk, R lateral lean  UPPER EXTREMITY ROM:   Passive ROM Right eval Left eval  Shoulder flexion 50d w/pain   Shoulder extension    Shoulder abduction 40d w/pain    Shoulder adduction    Shoulder internal rotation 20d w/ pain   Shoulder external rotation 10d w/pain   Elbow flexion full   Elbow extension 10 d from full extnesion with some pain   Wrist flexion 60   Wrist extension 20   Wrist ulnar deviation    Wrist radial deviation    Wrist pronation    Wrist supination    (Blank rows = not tested)  UPPER EXTREMITY MMT: L WFL  MMT Right eval Left eval  Shoulder flexion 1-   Shoulder extension    Shoulder abduction    Shoulder adduction 2-   Shoulder internal rotation 2-   Shoulder external rotation 1   Middle trapezius    Lower trapezius    Elbow flexion 2+   Elbow extension  2-   Wrist flexion    Wrist extension    Wrist ulnar deviation    Wrist radial deviation    Wrist pronation    Wrist supination    Grip strength (lbs) 2   (Blank rows = not tested)   TUG time: 50 seconds with use of the hemiwalker LE MMT 4-/5  right hip flexion, knee ext, knee flexion, ankle DF, right hip abduction 3+/5, PF 3+/5  Using hemiwalker, decrease R foot clearance, R foot ER, decrease hip flexion, decreased stance time on R LE, decreased step length, foot drop   Instability tests: Apprehension test: positive  and Sulcus sign: positive   JOINT MOBILITY TESTING:  Pain with PROM, muscle guarding, instability in R shoulder, scapular winging  PALPATION:  TTP to the right upper lateral arm   TODAY'S TREATMENT:  11/19/21 Nustep without the right UE Level 4 x 5 minutes UE ranger circles and some ER/IR Yellow tband IR and row  PATIENT EDUCATION: Education details: POC Person educated: Patient and brother Education method: Customer service manager Education comprehension: verbalized understanding   HOME EXERCISE PROGRAM: TBD  ASSESSMENT:  CLINICAL IMPRESSION: Patient is a 66 y.o. male who was seen today for physical therapy evaluation and treatment post CVA in March and right shoulder scope September 14th. He presents with R sided hemiplegia and weakness. Patient has decreased stability and mobility in R shoulder and is at risk for subluxations. He presents with weakness and ROM deficits. He reports that since the shoulder scope he has had increased pain, I feel that he does exhibit some neglect of this arm as he really avoids pain and motions and even though he has some movements he will not use the arm at times  He has decreased safety awareness and will leave hemi-walker and furniture walk. He presents with some increased right LE strength compared to June evaluation.  TUG time was 50 seconds.  During the evaluation he tended to avoid the motions that were asked due  to pain.    OBJECTIVE IMPAIRMENTS Abnormal gait, decreased mobility, difficulty walking, decreased ROM, decreased strength, decreased safety awareness, and pain.   ACTIVITY LIMITATIONS carrying, lifting, transfers, bathing, toileting, dressing, reach over head, and locomotion level  PARTICIPATION LIMITATIONS: cleaning, laundry, driving, community activity, and yard work  PERSONAL FACTORS Age, Time since onset of injury/illness/exacerbation, and 3+ comorbidities: chronic pain, acute CVA, stroke, OA  are also affecting patient's functional outcome.   REHAB POTENTIAL: Fair due to time since stroke and increased deficits on R side  CLINICAL DECISION MAKING: Evolving/moderate complexity  EVALUATION COMPLEXITY: Moderate   GOALS: Goals reviewed with patient? No  SHORT TERM GOALS: Target date: 12/10/21 Patient will be independent with initial HEP.  Goal status: INITIAL   LONG TERM GOALS: Target date: 02/19/22  1.  Patient will report 75% improvement in R shoulder pain to improve QOL.  Goal status: INITIAL  2.  Patient to demonstrate improved upright posture with posterior shoulder girdle engaged to promote improved glenohumeral joint mobility. Baseline: decreased shoulder height on R side, decreased shoulder stability, scapular winging  Goal status: INITIAL  3.  Patient to improve R shoulder AROM to  within patient's functional norm  without pain provocation to allow for increased ease of ADLs.  Baseline: measured w/PROM Goal status: INITIAL  4.  Patient will demonstrate improved functional UE strength as demonstrated by >= 3+/5 in all muscle groups . Goal status: INITIAL  5.  Patient will decrease TUG time to 24 seconds Baseline: 50 seconds Goal status: INITIAL    PLAN: PT FREQUENCY: 2x/week  PT DURATION: 12 weeks  PLANNED INTERVENTIONS: Therapeutic exercises, Therapeutic activity, Neuromuscular re-education, Balance training, Gait training, Patient/Family education, Joint  mobilization, Stair training, Orthotic/Fit training, and Manual therapy  PLAN FOR NEXT SESSION: iStart works on shoulder function and strength   Gomer France W, PT 11/19/2021, 8:54 AM

## 2021-11-24 ENCOUNTER — Other Ambulatory Visit (HOSPITAL_COMMUNITY): Payer: Self-pay

## 2021-11-24 ENCOUNTER — Ambulatory Visit (INDEPENDENT_AMBULATORY_CARE_PROVIDER_SITE_OTHER): Payer: Medicare Other | Admitting: Physician Assistant

## 2021-11-24 ENCOUNTER — Encounter: Payer: Self-pay | Admitting: Physical Therapy

## 2021-11-24 ENCOUNTER — Ambulatory Visit (INDEPENDENT_AMBULATORY_CARE_PROVIDER_SITE_OTHER): Payer: Medicare Other

## 2021-11-24 ENCOUNTER — Encounter: Payer: Self-pay | Admitting: Physician Assistant

## 2021-11-24 DIAGNOSIS — M25521 Pain in right elbow: Secondary | ICD-10-CM

## 2021-11-24 DIAGNOSIS — M7541 Impingement syndrome of right shoulder: Secondary | ICD-10-CM | POA: Diagnosis not present

## 2021-11-24 MED ORDER — HYDROCODONE-ACETAMINOPHEN 5-325 MG PO TABS
1.0000 | ORAL_TABLET | Freq: Four times a day (QID) | ORAL | 0 refills | Status: DC | PRN
  Filled 2021-11-24: qty 30, 8d supply, fill #0

## 2021-11-24 NOTE — Progress Notes (Signed)
HPI: [name] returns today status post right shoulder arthroscopy 10/13/2021.  Shoulder arthroscopy included lysis of adhesions and release of his joint capsule.  Also manipulation shoulder however unable to get full abduction and extension of the shoulder.  He has been to 1 therapy visit.  He is taking Tylenol and using rubs on the shoulder.  He wants radiographs of the shoulder today and also of his right elbow due to the decreased range of motion.  Again he has had stroke with residual effects on the right.  He is also asking about his left knee which she has had prior injections in.  Review of systems: See HPI otherwise negative  Physical exam: Right shoulder decreased range of motion actively can come to about 70 degrees passively and bring near to 90 degrees of extension.  Right elbow he has full flexion but lacks full extension by about 10 to 15 degrees.  He has full supination pronation of the forearm. Left knee: Good range of motion.  Significant patellofemoral crepitus.  No abnormal warmth erythema or effusion. Radiographs: 3 views right shoulder: Shoulder is well located.  No acute fractures no acute findings.  Glenohumeral joints well-maintained. Right elbow: 3 views: Elbow is well located.  No acute fractures acute findings.  Normal bone density.  Impression: Status post right shoulder arthroscopy 10/23/2021 Left knee pain Right elbow pain/decreased range of motion   Plan: Recommend continued work on range of motion including the elbow and the shoulder.  In regards to his knee discussed quad strengthening exercises.  Questions were encouraged and answered.  Copy of radiographs was given.  Discussed with him that his decreased range of motion of the shoulder and the elbow residual effect of his stroke.  At this point time all week and really recommended that he continue to work on range of motion and strengthening as tolerated.  He may benefit from repeat shoulder injection in the  future.

## 2021-11-26 ENCOUNTER — Ambulatory Visit: Payer: Medicare Other | Admitting: Physical Therapy

## 2021-11-26 ENCOUNTER — Encounter: Payer: Self-pay | Admitting: Physical Therapy

## 2021-11-26 DIAGNOSIS — H5203 Hypermetropia, bilateral: Secondary | ICD-10-CM | POA: Diagnosis not present

## 2021-12-01 ENCOUNTER — Encounter: Payer: Self-pay | Admitting: Physical Therapy

## 2021-12-01 ENCOUNTER — Ambulatory Visit: Payer: Medicare Other | Admitting: Orthopaedic Surgery

## 2021-12-01 ENCOUNTER — Ambulatory Visit: Payer: Medicare Other | Admitting: Physical Therapy

## 2021-12-01 DIAGNOSIS — I69351 Hemiplegia and hemiparesis following cerebral infarction affecting right dominant side: Secondary | ICD-10-CM

## 2021-12-01 DIAGNOSIS — M6281 Muscle weakness (generalized): Secondary | ICD-10-CM

## 2021-12-01 DIAGNOSIS — M25511 Pain in right shoulder: Secondary | ICD-10-CM

## 2021-12-01 DIAGNOSIS — R293 Abnormal posture: Secondary | ICD-10-CM | POA: Diagnosis not present

## 2021-12-01 DIAGNOSIS — R2689 Other abnormalities of gait and mobility: Secondary | ICD-10-CM | POA: Diagnosis not present

## 2021-12-01 DIAGNOSIS — R29818 Other symptoms and signs involving the nervous system: Secondary | ICD-10-CM | POA: Diagnosis not present

## 2021-12-01 DIAGNOSIS — Z9889 Other specified postprocedural states: Secondary | ICD-10-CM | POA: Diagnosis not present

## 2021-12-01 DIAGNOSIS — Z8673 Personal history of transient ischemic attack (TIA), and cerebral infarction without residual deficits: Secondary | ICD-10-CM | POA: Diagnosis not present

## 2021-12-01 NOTE — Therapy (Signed)
OUTPATIENT PHYSICAL THERAPY SHOULDER TREATMENT   Patient Name: Aaron Dennis MRN: [id number] DOB:02-05-1956, 66 y.o., male Today's Date: 12/01/2021   PT End of Session - 12/01/21 1021     Visit Number 2    Date for PT Re-Evaluation 02/19/22    Authorization Type [employer] Medicare    PT Start Time 1016    PT Stop Time 1100    PT Time Calculation (min) 44 min    Activity Tolerance Patient tolerated treatment well    Behavior During Therapy WFL for tasks assessed/performed             Past Medical History:  Diagnosis Date   Constipation    Depression    GERD (gastroesophageal reflux disease)    Hypertension    Lung nodule    7 mm LUL nodule   Polycythemia    Stroke ([place]) 05/01/2021   acute left frontoparietal infarct, residual right-sided hemiparesis   Vertebral artery aneurysm (HCC)    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   Vitamin D deficiency    Past Surgical History:  Procedure Laterality Date   IR RADIOLOGIST EVAL & MGMT  07/02/2021   IR RADIOLOGIST EVAL & MGMT  07/22/2021   LIPOMA EXCISION     SHOULDER ARTHROSCOPY WITH SUBACROMIAL DECOMPRESSION Right 10/23/2021   Procedure: RIGHT SHOULDER ARTHROSCOPY WITH DEBRIDEMENT, SUBACROMIAL DECOMPRESSION, MANIPULATION UNDER ANESTHESIA;  Surgeon: Mcarthur Rossetti, MD;  Location: [facility];  Service: Orthopedics;  Laterality: Right;   Patient Active Problem List   Diagnosis Date Noted   Arthrofibrosis of right shoulder 10/23/2021   Aneurysm, cerebral, nonruptured 08/13/2021   Status post stroke due to cerebrovascular disease    Cerebral edema ([place]) 04/29/2021   Stroke (cerebrum) ([place]) 04/29/2021   Right sided weakness    Acute CVA (cerebrovascular accident) ([place]) 04/27/2021   Primary osteoarthritis of left knee 01/13/2019   Primary osteoarthritis of right knee 01/13/2019   Flat foot 11/28/2017   Tendonitis of foot 11/28/2017   Polycythemia 05/12/2017   Vitamin D deficiency 04/21/2017   Bony sclerosis  08/05/2016   Lung nodule    Chronic pain of both knees 12/05/2015   Gastroesophageal reflux disease without esophagitis 05/17/2014   Essential hypertension 05/17/2014   Coronary artery calcification 08/17/2012   Abnormal LFTs (liver function tests) 07/11/2012   Chronic abdominal pain 06/24/2012   Right-sided chest wall pain 06/24/2012   Constipation, chronic 06/24/2012   Hyperbilirubinemia 06/24/2012   Chronic back pain 06/24/2012   DDD (degenerative disc disease), thoracolumbar 06/24/2012   History of nephrolithiasis 06/24/2012   Side pain 06/24/2012   Low back pain at multiple sites 06/24/2012    PCP: [name]  REFERRING PROVIDER: [name]  REFERRING DIAG: M25.511  THERAPY DIAG:  Hemiplegia and hemiparesis following cerebral infarction affecting right dominant side (HCC)  Muscle weakness (generalized)  Acute pain of right shoulder  Other abnormalities of gait and mobility  Rationale for Evaluation and Treatment Rehabilitation  ONSET DATE: 07/15/21  SUBJECTIVE:  SUBJECTIVE STATEMENT: Patient saw MD, feels that he Dennis to have more PT and movements to help the pain, does feel like some of the issue is the stroke PERTINENT HISTORY: Acute CVA 3/19  PAIN:  Are you having pain? Yes: NPRS scale: 8/10 Pain location: R shoulder Pain description: sharp Aggravating factors: movement, sleeping on R  Relieving factors: Tylenol and meloxicam  can get down to 3/10 with mm rub and sleep  PRECAUTIONS: Fall  WEIGHT BEARING RESTRICTIONS No  FALLS:  Has patient fallen in last 6 months? Yes. Number of falls 4 reports no falls in the past few months  LIVING ENVIRONMENT: Lives with: lives with their family Lives in: House/apartment Stairs: Yes: External: 2 steps; none Has following  equipment at home: Single point cane, Hemi walker, Wheelchair (manual), and Grab bars  OCCUPATION: retired  PLOF: [place] try to get stronger, try to use my legs and feet   OBJECTIVE:   DIAGNOSTIC FINDINGS:  Right shoulder 3 views: Shoulder is well located.  Subacromial space well-maintained.  Type II downsloping acromion.  No acute fractures or acute findings Right wrist 3 views: No acute fractures.  No subluxation dislocation of  the carpal bones.  Wrist joints well-maintained.  No bony abnormalities.  PATIENT SURVEYS:  FOTO 4  COGNITION:  Overall cognitive status: Within functional limits for tasks assessed     SENSATION: WFL  POSTURE: Decreased R shoulder height, hemiparesis, forward head, flexed trunk, R lateral lean  UPPER EXTREMITY ROM:   Passive ROM Right eval Right 12/01/21  Shoulder flexion 50d w/pain 80  Shoulder extension    Shoulder abduction 40d w/pain  70  Shoulder adduction    Shoulder internal rotation 20d w/ pain   Shoulder external rotation 10d w/pain   Elbow flexion full   Elbow extension 10 d from full extnesion with some pain   Wrist flexion 60   Wrist extension 20   Wrist ulnar deviation    Wrist radial deviation    Wrist pronation    Wrist supination    (Blank rows = not tested)  UPPER EXTREMITY MMT: L WFL  MMT Right eval Left eval  Shoulder flexion 1-   Shoulder extension    Shoulder abduction    Shoulder adduction 2-   Shoulder internal rotation 2-   Shoulder external rotation 1   Middle trapezius    Lower trapezius    Elbow flexion 2+   Elbow extension 2-   Wrist flexion    Wrist extension    Wrist ulnar deviation    Wrist radial deviation    Wrist pronation    Wrist supination    Grip strength (lbs) 2   (Blank rows = not tested)   TUG time: 50 seconds with use of the hemiwalker LE MMT 4-/5  right hip flexion, knee ext, knee flexion, ankle DF, right hip abduction 3+/5, PF 3+/5  Using hemiwalker,  decrease R foot clearance, R foot ER, decrease hip flexion, decreased stance time on R LE, decreased step length, foot drop   Instability tests: Apprehension test: positive  and Sulcus sign: positive   JOINT MOBILITY TESTING:  Pain with PROM, muscle guarding, instability in R shoulder, scapular winging  PALPATION:  TTP to the right upper lateral arm   TODAY'S TREATMENT:  12/01/21 UE ranger with and without assist multiple angles and motions AAROM of the right UE motions and angles Sliding board slides of the UE Yellow tband ER/IR, some rows and some pushes Approximation  of the right shoulder in various angles Nustep level 5 x 6 mintues  11/19/21 Nustep without the right UE Level 4 x 5 minutes UE ranger circles and some ER/IR Yellow tband IR and row  PATIENT EDUCATION: Education details: POC Person educated: Patient and brother Education method: Customer service manager Education comprehension: verbalized understanding   HOME EXERCISE PROGRAM: TBD  ASSESSMENT:  CLINICAL IMPRESSION: Patient allowed better passive motions and I added a lot of assisted motions and some resisted motions, he does have the ability to push at times and ER/Ir with arm at side.  Still high rating of pain above 80 degrees    OBJECTIVE IMPAIRMENTS Abnormal gait, decreased mobility, difficulty walking, decreased ROM, decreased strength, decreased safety awareness, and pain.   ACTIVITY LIMITATIONS carrying, lifting, transfers, bathing, toileting, dressing, reach over head, and locomotion level  PARTICIPATION LIMITATIONS: cleaning, laundry, driving, community activity, and yard work  PERSONAL FACTORS Age, Time since onset of injury/illness/exacerbation, and 3+ comorbidities: chronic pain, acute CVA, stroke, OA  are also affecting patient's functional outcome.   REHAB POTENTIAL: Fair due to time since stroke and increased deficits on R side  CLINICAL DECISION MAKING: Evolving/moderate  complexity  EVALUATION COMPLEXITY: Moderate   GOALS: Goals reviewed with patient? No  SHORT TERM GOALS: Target date: 12/10/21 Patient will be independent with initial HEP.  Goal status: INITIAL   LONG TERM GOALS: Target date: 02/19/22  1.  Patient will report 75% improvement in R shoulder pain to improve QOL.  Goal status: INITIAL  2.  Patient to demonstrate improved upright posture with posterior shoulder girdle engaged to promote improved glenohumeral joint mobility. Baseline: decreased shoulder height on R side, decreased shoulder stability, scapular winging  Goal status: INITIAL  3.  Patient to improve R shoulder AROM to  within patient's functional norm  without pain provocation to allow for increased ease of ADLs.  Baseline: measured w/PROM Goal status: INITIAL  4.  Patient will demonstrate improved functional UE strength as demonstrated by >= 3+/5 in all muscle groups . Goal status: INITIAL  5.  Patient will decrease TUG time to 24 seconds Baseline: 50 seconds Goal status: INITIAL    PLAN: PT FREQUENCY: 2x/week  PT DURATION: 12 weeks  PLANNED INTERVENTIONS: Therapeutic exercises, Therapeutic activity, Neuromuscular re-education, Balance training, Gait training, Patient/Family education, Joint mobilization, Stair training, Orthotic/Fit training, and Manual therapy  PLAN FOR NEXT SESSION: iStart works on shoulder function and strength   Rashad Auld W, PT 12/01/2021, 10:21 AM

## 2021-12-03 ENCOUNTER — Ambulatory Visit: Payer: Medicare Other | Admitting: Physical Therapy

## 2021-12-03 ENCOUNTER — Encounter: Payer: Self-pay | Admitting: Physical Therapy

## 2021-12-03 DIAGNOSIS — I69351 Hemiplegia and hemiparesis following cerebral infarction affecting right dominant side: Secondary | ICD-10-CM | POA: Diagnosis not present

## 2021-12-03 DIAGNOSIS — R29818 Other symptoms and signs involving the nervous system: Secondary | ICD-10-CM

## 2021-12-03 DIAGNOSIS — M6281 Muscle weakness (generalized): Secondary | ICD-10-CM | POA: Diagnosis not present

## 2021-12-03 DIAGNOSIS — R293 Abnormal posture: Secondary | ICD-10-CM | POA: Diagnosis not present

## 2021-12-03 DIAGNOSIS — M25511 Pain in right shoulder: Secondary | ICD-10-CM | POA: Diagnosis not present

## 2021-12-03 DIAGNOSIS — Z9889 Other specified postprocedural states: Secondary | ICD-10-CM | POA: Diagnosis not present

## 2021-12-03 DIAGNOSIS — R2689 Other abnormalities of gait and mobility: Secondary | ICD-10-CM | POA: Diagnosis not present

## 2021-12-03 DIAGNOSIS — Z8673 Personal history of transient ischemic attack (TIA), and cerebral infarction without residual deficits: Secondary | ICD-10-CM

## 2021-12-03 NOTE — Therapy (Signed)
OUTPATIENT PHYSICAL THERAPY SHOULDER TREATMENT   Patient Name: Aaron Dennis MRN: [id number] DOB:[birth year], 66 y.o., male Today's Date: 12/03/2021   PT End of Session - 12/03/21 1017     Visit Number 3    Date for PT Re-Evaluation 02/19/22    Authorization Type [employer] Medicare    PT Start Time 1013    PT Stop Time 1100    PT Time Calculation (min) 47 min    Activity Tolerance Patient tolerated treatment well    Behavior During Therapy WFL for tasks assessed/performed             Past Medical History:  Diagnosis Date   Constipation    Depression    GERD (gastroesophageal reflux disease)    Hypertension    Lung nodule    7 mm LUL nodule   Polycythemia    Stroke ([place]) 05/01/2021   acute left frontoparietal infarct, residual right-sided hemiparesis   Vertebral artery aneurysm (HCC)    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   Vitamin D deficiency    Past Surgical History:  Procedure Laterality Date   IR RADIOLOGIST EVAL & MGMT  07/02/2021   IR RADIOLOGIST EVAL & MGMT  07/22/2021   LIPOMA EXCISION     SHOULDER ARTHROSCOPY WITH SUBACROMIAL DECOMPRESSION Right 10/23/2021   Procedure: RIGHT SHOULDER ARTHROSCOPY WITH DEBRIDEMENT, SUBACROMIAL DECOMPRESSION, MANIPULATION UNDER ANESTHESIA;  Surgeon: Mcarthur Rossetti, MD;  Location: [facility];  Service: Orthopedics;  Laterality: Right;   Patient Active Problem List   Diagnosis Date Noted   Arthrofibrosis of right shoulder 10/23/2021   Aneurysm, cerebral, nonruptured 08/13/2021   Status post stroke due to cerebrovascular disease    Cerebral edema ([place]) 04/29/2021   Stroke (cerebrum) ([place]) 04/29/2021   Right sided weakness    Acute CVA (cerebrovascular accident) ([place]) 04/27/2021   Primary osteoarthritis of left knee 01/13/2019   Primary osteoarthritis of right knee 01/13/2019   Flat foot 11/28/2017   Tendonitis of foot 11/28/2017   Polycythemia 05/12/2017   Vitamin D deficiency 04/21/2017   Bony sclerosis  08/05/2016   Lung nodule    Chronic pain of both knees 12/05/2015   Gastroesophageal reflux disease without esophagitis 05/17/2014   Essential hypertension 05/17/2014   Coronary artery calcification 08/17/2012   Abnormal LFTs (liver function tests) 07/11/2012   Chronic abdominal pain 06/24/2012   Right-sided chest wall pain 06/24/2012   Constipation, chronic 06/24/2012   Hyperbilirubinemia 06/24/2012   Chronic back pain 06/24/2012   DDD (degenerative disc disease), thoracolumbar 06/24/2012   History of nephrolithiasis 06/24/2012   Side pain 06/24/2012   Low back pain at multiple sites 06/24/2012    PCP: [name]  REFERRING PROVIDER: [name]  REFERRING DIAG: M25.511  THERAPY DIAG:  Hemiplegia and hemiparesis following cerebral infarction affecting right dominant side (HCC)  Muscle weakness (generalized)  Acute pain of right shoulder  Other abnormalities of gait and mobility  Other symptoms and signs involving the nervous system  Status post stroke due to cerebrovascular disease  Rationale for Evaluation and Treatment Rehabilitation  ONSET DATE: 07/15/21  SUBJECTIVE:  SUBJECTIVE STATEMENT: Reports that he was sore after the last visit PERTINENT HISTORY: Acute CVA 3/19  PAIN:  Are you having pain? Yes: NPRS scale: 8/10 Pain location: R shoulder Pain description: sharp Aggravating factors: movement, sleeping on R  Relieving factors: Tylenol and meloxicam  can get down to 3/10 with mm rub and sleep  PRECAUTIONS: Fall  WEIGHT BEARING RESTRICTIONS No  FALLS:  Has patient fallen in last 6 months? Yes. Number of falls 4 reports no falls in the past few months  LIVING ENVIRONMENT: Lives with: lives with their family Lives in: House/apartment Stairs: Yes: External: 2 steps;  none Has following equipment at home: Single point cane, Hemi walker, Wheelchair (manual), and Grab bars  OCCUPATION: retired  PLOF: [place] try to get stronger, try to use my legs and feet   OBJECTIVE:   DIAGNOSTIC FINDINGS:  Right shoulder 3 views: Shoulder is well located.  Subacromial space well-maintained.  Type II downsloping acromion.  No acute fractures or acute findings Right wrist 3 views: No acute fractures.  No subluxation dislocation of  the carpal bones.  Wrist joints well-maintained.  No bony abnormalities.  PATIENT SURVEYS:  FOTO 4  COGNITION:  Overall cognitive status: Within functional limits for tasks assessed     SENSATION: WFL  POSTURE: Decreased R shoulder height, hemiparesis, forward head, flexed trunk, R lateral lean  UPPER EXTREMITY ROM:   Passive ROM Right eval Right 12/01/21  Shoulder flexion 50d w/pain 80  Shoulder extension    Shoulder abduction 40d w/pain  70  Shoulder adduction    Shoulder internal rotation 20d w/ pain   Shoulder external rotation 10d w/pain   Elbow flexion full   Elbow extension 10 d from full extnesion with some pain   Wrist flexion 60   Wrist extension 20   Wrist ulnar deviation    Wrist radial deviation    Wrist pronation    Wrist supination    (Blank rows = not tested)  UPPER EXTREMITY MMT: L WFL  MMT Right eval Left eval  Shoulder flexion 1-   Shoulder extension    Shoulder abduction    Shoulder adduction 2-   Shoulder internal rotation 2-   Shoulder external rotation 1   Middle trapezius    Lower trapezius    Elbow flexion 2+   Elbow extension 2-   Wrist flexion    Wrist extension    Wrist ulnar deviation    Wrist radial deviation    Wrist pronation    Wrist supination    Grip strength (lbs) 2   (Blank rows = not tested)   TUG time: 50 seconds with use of the hemiwalker LE MMT 4-/5  right hip flexion, knee ext, knee flexion, ankle DF, right hip abduction 3+/5, PF  3+/5  Using hemiwalker, decrease R foot clearance, R foot ER, decrease hip flexion, decreased stance time on R LE, decreased step length, foot drop   Instability tests: Apprehension test: positive  and Sulcus sign: positive   JOINT MOBILITY TESTING:  Pain with PROM, muscle guarding, instability in R shoulder, scapular winging  PALPATION:  TTP to the right upper lateral arm   TODAY'S TREATMENT:  12/03/21 Nustep level 5 x 6 minutes UE ranger all motions some with some help various angles Isometrics all motions that he can do and 2x10 each Yellow tband ER/IR with some elbow support Yellow tband triceps 1# biceps curl in a limited ROM PROM right shoulder Leg press 20# both legs,  no weight right leg only  12/01/21 UE ranger with and without assist multiple angles and motions AAROM of the right UE motions and angles Sliding board slides of the UE Yellow tband ER/IR, some rows and some pushes Approximation of the right shoulder in various angles Nustep level 5 x 6 mintues  11/19/21 Nustep without the right UE Level 4 x 5 minutes UE ranger circles and some ER/IR Yellow tband IR and row  PATIENT EDUCATION: Education details: POC Person educated: Patient and brother Education method: Customer service manager Education comprehension: verbalized understanding   HOME EXERCISE PROGRAM: TBD  ASSESSMENT:  CLINICAL IMPRESSION: Patient continues to have pain in the shoulder and is limited with activity, he does tend to avoid pain and did not allow as good of PROM today but he did c/o increased soreness, gave good resistance with isometrics, again I do feel that he has some neglect and does avoid that right arm  OBJECTIVE IMPAIRMENTS Abnormal gait, decreased mobility, difficulty walking, decreased ROM, decreased strength, decreased safety awareness, and pain.   ACTIVITY LIMITATIONS carrying, lifting, transfers, bathing, toileting, dressing, reach over head, and locomotion  level  PARTICIPATION LIMITATIONS: cleaning, laundry, driving, community activity, and yard work  PERSONAL FACTORS Age, Time since onset of injury/illness/exacerbation, and 3+ comorbidities: chronic pain, acute CVA, stroke, OA  are also affecting patient's functional outcome.   REHAB POTENTIAL: Fair due to time since stroke and increased deficits on R side  CLINICAL DECISION MAKING: Evolving/moderate complexity  EVALUATION COMPLEXITY: Moderate   GOALS: Goals reviewed with patient? No  SHORT TERM GOALS: Target date: 12/10/21 Patient will be independent with initial HEP.  Goal status:ongoing   LONG TERM GOALS: Target date: 02/19/22  1.  Patient will report 75% improvement in R shoulder pain to improve QOL.  Goal status: INITIAL  2.  Patient to demonstrate improved upright posture with posterior shoulder girdle engaged to promote improved glenohumeral joint mobility. Baseline: decreased shoulder height on R side, decreased shoulder stability, scapular winging  Goal status: ongoing  3.  Patient to improve R shoulder AROM to  within patient's functional norm  without pain provocation to allow for increased ease of ADLs.  Baseline: measured w/PROM Goal status: INITIAL  4.  Patient will demonstrate improved functional UE strength as demonstrated by >= 3+/5 in all muscle groups . Goal status: INITIAL  5.  Patient will decrease TUG time to 24 seconds Baseline: 50 seconds Goal status: INITIAL    PLAN: PT FREQUENCY: 2x/week  PT DURATION: 12 weeks  PLANNED INTERVENTIONS: Therapeutic exercises, Therapeutic activity, Neuromuscular re-education, Balance training, Gait training, Patient/Family education, Joint mobilization, Stair training, Orthotic/Fit training, and Manual therapy  PLAN FOR NEXT SESSION: iStart works on shoulder function and strength   Zyrah Wiswell W, PT 12/03/2021, 10:17 AM

## 2021-12-05 ENCOUNTER — Ambulatory Visit: Payer: Medicare Other | Admitting: Physical Medicine and Rehabilitation

## 2021-12-08 ENCOUNTER — Encounter: Payer: Self-pay | Admitting: Physical Therapy

## 2021-12-08 ENCOUNTER — Ambulatory Visit: Payer: Medicare Other | Admitting: Physical Therapy

## 2021-12-08 DIAGNOSIS — I69351 Hemiplegia and hemiparesis following cerebral infarction affecting right dominant side: Secondary | ICD-10-CM | POA: Diagnosis not present

## 2021-12-08 DIAGNOSIS — M6281 Muscle weakness (generalized): Secondary | ICD-10-CM | POA: Diagnosis not present

## 2021-12-08 DIAGNOSIS — Z9889 Other specified postprocedural states: Secondary | ICD-10-CM | POA: Diagnosis not present

## 2021-12-08 DIAGNOSIS — R2689 Other abnormalities of gait and mobility: Secondary | ICD-10-CM | POA: Diagnosis not present

## 2021-12-08 DIAGNOSIS — R293 Abnormal posture: Secondary | ICD-10-CM | POA: Diagnosis not present

## 2021-12-08 DIAGNOSIS — R29818 Other symptoms and signs involving the nervous system: Secondary | ICD-10-CM | POA: Diagnosis not present

## 2021-12-08 DIAGNOSIS — Z8673 Personal history of transient ischemic attack (TIA), and cerebral infarction without residual deficits: Secondary | ICD-10-CM | POA: Diagnosis not present

## 2021-12-08 DIAGNOSIS — M25511 Pain in right shoulder: Secondary | ICD-10-CM

## 2021-12-08 NOTE — Therapy (Signed)
OUTPATIENT PHYSICAL THERAPY SHOULDER TREATMENT   Patient Name: Aaron Dennis MRN: [id number] DOB:[birth year], 66 y.o., male Today's Date: 12/08/2021   PT End of Session - 12/08/21 1017     Visit Number 4    Date for PT Re-Evaluation 02/19/22    Authorization Type U[employer] Medicare    PT Start Time 7416    PT Stop Time 1100    PT Time Calculation (min) 45 min    Activity Tolerance Patient tolerated treatment well    Behavior During Therapy WFL for tasks assessed/performed             Past Medical History:  Diagnosis Date   Constipation    Depression    GERD (gastroesophageal reflux disease)    Hypertension    Lung nodule    7 mm LUL nodule   Polycythemia    Stroke ([place]) 05/01/2021   acute left frontoparietal infarct, residual right-sided hemiparesis   Vertebral artery aneurysm (HCC)    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   Vitamin D deficiency    Past Surgical History:  Procedure Laterality Date   IR RADIOLOGIST EVAL & MGMT  07/02/2021   IR RADIOLOGIST EVAL & MGMT  07/22/2021   LIPOMA EXCISION     SHOULDER ARTHROSCOPY WITH SUBACROMIAL DECOMPRESSION Right 10/23/2021   Procedure: RIGHT SHOULDER ARTHROSCOPY WITH DEBRIDEMENT, SUBACROMIAL DECOMPRESSION, MANIPULATION UNDER ANESTHESIA;  Surgeon: Mcarthur Rossetti, MD;  Location: [facility];  Service: Orthopedics;  Laterality: Right;   Patient Active Problem List   Diagnosis Date Noted   Arthrofibrosis of right shoulder 10/23/2021   Aneurysm, cerebral, nonruptured 08/13/2021   Status post stroke due to cerebrovascular disease    Cerebral edema ([place]) 04/29/2021   Stroke (cerebrum) ([place]) 04/29/2021   Right sided weakness    Acute CVA (cerebrovascular accident) ([place]) 04/27/2021   Primary osteoarthritis of left knee 01/13/2019   Primary osteoarthritis of right knee 01/13/2019   Flat foot 11/28/2017   Tendonitis of foot 11/28/2017   Polycythemia 05/12/2017   Vitamin D deficiency 04/21/2017   Bony sclerosis  08/05/2016   Lung nodule    Chronic pain of both knees 12/05/2015   Gastroesophageal reflux disease without esophagitis 05/17/2014   Essential hypertension 05/17/2014   Coronary artery calcification 08/17/2012   Abnormal LFTs (liver function tests) 07/11/2012   Chronic abdominal pain 06/24/2012   Right-sided chest wall pain 06/24/2012   Constipation, chronic 06/24/2012   Hyperbilirubinemia 06/24/2012   Chronic back pain 06/24/2012   DDD (degenerative disc disease), thoracolumbar 06/24/2012   History of nephrolithiasis 06/24/2012   Side pain 06/24/2012   Low back pain at multiple sites 06/24/2012    PCP: [name]  REFERRING PROVIDER: [name]  REFERRING DIAG: M25.511  THERAPY DIAG:  Hemiplegia and hemiparesis following cerebral infarction affecting right dominant side (HCC)  Muscle weakness (generalized)  Acute pain of right shoulder  Other abnormalities of gait and mobility  Other symptoms and signs involving the nervous system  Status post stroke due to cerebrovascular disease  Abnormal posture  Rationale for Evaluation and Treatment Rehabilitation  ONSET DATE: 07/15/21  SUBJECTIVE:  SUBJECTIVE STATEMENT: Reports right bicep pain PERTINENT HISTORY: Acute CVA 3/19  PAIN:  Are you having pain? Yes: NPRS scale: 7/10 Pain location: R shoulder Pain description: sharp Aggravating factors: movement, sleeping on R  Relieving factors: Tylenol and meloxicam  can get down to 3/10 with mm rub and sleep  PRECAUTIONS: Fall  WEIGHT BEARING RESTRICTIONS No  FALLS:  Has patient fallen in last 6 months? Yes. Number of falls 4 reports no falls in the past few months  LIVING ENVIRONMENT: Lives with: lives with their family Lives in: House/apartment Stairs: Yes: External: 2 steps;  none Has following equipment at home: Single point cane, Hemi walker, Wheelchair (manual), and Grab bars  OCCUPATION: retired  PLOF: [place] try to get stronger, try to use my legs and feet   OBJECTIVE:   DIAGNOSTIC FINDINGS:  Right shoulder 3 views: Shoulder is well located.  Subacromial space well-maintained.  Type II downsloping acromion.  No acute fractures or acute findings Right wrist 3 views: No acute fractures.  No subluxation dislocation of  the carpal bones.  Wrist joints well-maintained.  No bony abnormalities.  PATIENT SURVEYS:  FOTO 4  COGNITION:  Overall cognitive status: Within functional limits for tasks assessed     SENSATION: WFL  POSTURE: Decreased R shoulder height, hemiparesis, forward head, flexed trunk, R lateral lean  UPPER EXTREMITY ROM:   Passive ROM Right eval Right 12/01/21  Shoulder flexion 50d w/pain 80  Shoulder extension    Shoulder abduction 40d w/pain  70  Shoulder adduction    Shoulder internal rotation 20d w/ pain   Shoulder external rotation 10d w/pain   Elbow flexion full   Elbow extension 10 d from full extnesion with some pain   Wrist flexion 60   Wrist extension 20   Wrist ulnar deviation    Wrist radial deviation    Wrist pronation    Wrist supination    (Blank rows = not tested)  UPPER EXTREMITY MMT: L WFL  MMT Right eval Left eval  Shoulder flexion 1-   Shoulder extension    Shoulder abduction    Shoulder adduction 2-   Shoulder internal rotation 2-   Shoulder external rotation 1   Middle trapezius    Lower trapezius    Elbow flexion 2+   Elbow extension 2-   Wrist flexion    Wrist extension    Wrist ulnar deviation    Wrist radial deviation    Wrist pronation    Wrist supination    Grip strength (lbs) 2   (Blank rows = not tested)   TUG time: 50 seconds with use of the hemiwalker LE MMT 4-/5  right hip flexion, knee ext, knee flexion, ankle DF, right hip abduction 3+/5, PF  3+/5  Using hemiwalker, decrease R foot clearance, R foot ER, decrease hip flexion, decreased stance time on R LE, decreased step length, foot drop   Instability tests: Apprehension test: positive  and Sulcus sign: positive   JOINT MOBILITY TESTING:  Pain with PROM, muscle guarding, instability in R shoulder, scapular winging  PALPATION:  TTP to the right upper lateral arm   TODAY'S TREATMENT:  12/08/21 Sliding board flexion, in and out in lap for ER/IR 1# biceps Yellow tband ER/IR Yellow tband row Yellow tband triceps Yellow tband extension Wrist and finger motions passive and active Right shoulder isometrics Supine ER/IR and biceps Supine AAROM, PROM various angles  Nustep level 4 x 6 minutes  12/03/21 Nustep level 5 x  6 minutes UE ranger all motions some with some help various angles Isometrics all motions that he can do and 2x10 each Yellow tband ER/IR with some elbow support Yellow tband triceps 1# biceps curl in a limited ROM PROM right shoulder Leg press 20# both legs, no weight right leg only  12/01/21 UE ranger with and without assist multiple angles and motions AAROM of the right UE motions and angles Sliding board slides of the UE Yellow tband ER/IR, some rows and some pushes Approximation of the right shoulder in various angles Nustep level 5 x 6 mintues  11/19/21 Nustep without the right UE Level 4 x 5 minutes UE ranger circles and some ER/IR Yellow tband IR and row  PATIENT EDUCATION: Education details: POC Person educated: Patient and brother Education method: Customer service manager Education comprehension: verbalized understanding   HOME EXERCISE PROGRAM: TBD  ASSESSMENT:  CLINICAL IMPRESSION: Patient continues to have pain in the shoulder and is limited with activity, he does tend to avoid pain and at times will not try saying he can't but after a few AAROM he will be able to do on his own.  I encouraged him to try more and do  more, he asked for help to get up from supine but I told him he can do it and he did  OBJECTIVE IMPAIRMENTS Abnormal gait, decreased mobility, difficulty walking, decreased ROM, decreased strength, decreased safety awareness, and pain.   ACTIVITY LIMITATIONS carrying, lifting, transfers, bathing, toileting, dressing, reach over head, and locomotion level  PARTICIPATION LIMITATIONS: cleaning, laundry, driving, community activity, and yard work  PERSONAL FACTORS Age, Time since onset of injury/illness/exacerbation, and 3+ comorbidities: chronic pain, acute CVA, stroke, OA  are also affecting patient's functional outcome.   REHAB POTENTIAL: Fair due to time since stroke and increased deficits on R side  CLINICAL DECISION MAKING: Evolving/moderate complexity  EVALUATION COMPLEXITY: Moderate   GOALS: Goals reviewed with patient? No  SHORT TERM GOALS: Target date: 12/10/21 Patient will be independent with initial HEP.  Goal status:ongoing   LONG TERM GOALS: Target date: 02/19/22  1.  Patient will report 75% improvement in R shoulder pain to improve QOL.  Goal status: INITIAL  2.  Patient to demonstrate improved upright posture with posterior shoulder girdle engaged to promote improved glenohumeral joint mobility. Baseline: decreased shoulder height on R side, decreased shoulder stability, scapular winging  Goal status: ongoing  3.  Patient to improve R shoulder AROM to  within patient's functional norm  without pain provocation to allow for increased ease of ADLs.  Baseline: measured w/PROM Goal status: INITIAL  4.  Patient will demonstrate improved functional UE strength as demonstrated by >= 3+/5 in all muscle groups . Goal status: INITIAL  5.  Patient will decrease TUG time to 24 seconds Baseline: 50 seconds Goal status: INITIAL    PLAN: PT FREQUENCY: 2x/week  PT DURATION: 12 weeks  PLANNED INTERVENTIONS: Therapeutic exercises, Therapeutic activity, Neuromuscular  re-education, Balance training, Gait training, Patient/Family education, Joint mobilization, Stair training, Orthotic/Fit training, and Manual therapy  PLAN FOR NEXT SESSION: iStart works on shoulder function and strength   Taisia Fantini W, PT 12/08/2021, 10:24 AM

## 2021-12-10 ENCOUNTER — Encounter: Payer: Self-pay | Admitting: Physical Therapy

## 2021-12-10 ENCOUNTER — Ambulatory Visit: Payer: Medicare Other | Attending: Physician Assistant | Admitting: Physical Therapy

## 2021-12-10 DIAGNOSIS — I69351 Hemiplegia and hemiparesis following cerebral infarction affecting right dominant side: Secondary | ICD-10-CM | POA: Diagnosis not present

## 2021-12-10 DIAGNOSIS — M6281 Muscle weakness (generalized): Secondary | ICD-10-CM | POA: Diagnosis not present

## 2021-12-10 DIAGNOSIS — M25511 Pain in right shoulder: Secondary | ICD-10-CM

## 2021-12-10 DIAGNOSIS — R29818 Other symptoms and signs involving the nervous system: Secondary | ICD-10-CM

## 2021-12-10 DIAGNOSIS — Z8673 Personal history of transient ischemic attack (TIA), and cerebral infarction without residual deficits: Secondary | ICD-10-CM | POA: Insufficient documentation

## 2021-12-10 DIAGNOSIS — R293 Abnormal posture: Secondary | ICD-10-CM | POA: Insufficient documentation

## 2021-12-10 DIAGNOSIS — R2689 Other abnormalities of gait and mobility: Secondary | ICD-10-CM | POA: Diagnosis not present

## 2021-12-10 NOTE — Therapy (Signed)
OUTPATIENT PHYSICAL THERAPY SHOULDER TREATMENT   Patient Name: Aaron Dennis MRN: [id number] DOB:1955/09/17, 66 y.o., male Today's Date: 12/10/2021   PT End of Session - 12/10/21 1015     Visit Number 5    Date for PT Re-Evaluation 02/19/22    Authorization Type UHC Medicare    PT Start Time 1014    PT Stop Time 1100    PT Time Calculation (min) 46 min    Activity Tolerance Patient tolerated treatment well    Behavior During Therapy WFL for tasks assessed/performed             Past Medical History:  Diagnosis Date   Constipation    Depression    GERD (gastroesophageal reflux disease)    Hypertension    Lung nodule    7 mm LUL nodule   Polycythemia    Stroke ([place]) 05/01/2021   acute left frontoparietal infarct, residual right-sided hemiparesis   Vertebral artery aneurysm (HCC)    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   Vitamin D deficiency    Past Surgical History:  Procedure Laterality Date   IR RADIOLOGIST EVAL & MGMT  07/02/2021   IR RADIOLOGIST EVAL & MGMT  07/22/2021   LIPOMA EXCISION     SHOULDER ARTHROSCOPY WITH SUBACROMIAL DECOMPRESSION Right 10/23/2021   Procedure: RIGHT SHOULDER ARTHROSCOPY WITH DEBRIDEMENT, SUBACROMIAL DECOMPRESSION, MANIPULATION UNDER ANESTHESIA;  Surgeon: Mcarthur Rossetti, MD;  Location: [facility];  Service: Orthopedics;  Laterality: Right;   Patient Active Problem List   Diagnosis Date Noted   Arthrofibrosis of right shoulder 10/23/2021   Aneurysm, cerebral, nonruptured 08/13/2021   Status post stroke due to cerebrovascular disease    Cerebral edema ([place]) 04/29/2021   Stroke (cerebrum) ([place]) 04/29/2021   Right sided weakness    Acute CVA (cerebrovascular accident) ([place]) 04/27/2021   Primary osteoarthritis of left knee 01/13/2019   Primary osteoarthritis of right knee 01/13/2019   Flat foot 11/28/2017   Tendonitis of foot 11/28/2017   Polycythemia 05/12/2017   Vitamin D deficiency 04/21/2017   Bony sclerosis  08/05/2016   Lung nodule    Chronic pain of both knees 12/05/2015   Gastroesophageal reflux disease without esophagitis 05/17/2014   Essential hypertension 05/17/2014   Coronary artery calcification 08/17/2012   Abnormal LFTs (liver function tests) 07/11/2012   Chronic abdominal pain 06/24/2012   Right-sided chest wall pain 06/24/2012   Constipation, chronic 06/24/2012   Hyperbilirubinemia 06/24/2012   Chronic back pain 06/24/2012   DDD (degenerative disc disease), thoracolumbar 06/24/2012   History of nephrolithiasis 06/24/2012   Side pain 06/24/2012   Low back pain at multiple sites 06/24/2012    PCP: [name]  REFERRING PROVIDER: [name]  REFERRING DIAG: M25.511  THERAPY DIAG:  Hemiplegia and hemiparesis following cerebral infarction affecting right dominant side (HCC)  Muscle weakness (generalized)  Acute pain of right shoulder  Other abnormalities of gait and mobility  Other symptoms and signs involving the nervous system  Rationale for Evaluation and Treatment Rehabilitation  ONSET DATE: 07/15/21  SUBJECTIVE:  SUBJECTIVE STATEMENT: Continues to rate pain a 7/10 in the shoulder at most times, reports never less pain PERTINENT HISTORY: Acute CVA 3/19  PAIN:  Are you having pain? Yes: NPRS scale: 7/10 Pain location: R shoulder Pain description: sharp Aggravating factors: movement, sleeping on R  Relieving factors: Tylenol and meloxicam  can get down to 3/10 with mm rub and sleep  PRECAUTIONS: Fall  WEIGHT BEARING RESTRICTIONS No  FALLS:  Has patient fallen in last 6 months? Yes. Number of falls 4 reports no falls in the past few months  LIVING ENVIRONMENT: Lives with: lives with their family Lives in: House/apartment Stairs: Yes: External: 2 steps; none Has  following equipment at home: Single point cane, Hemi walker, Wheelchair (manual), and Grab bars  OCCUPATION: retired  PLOF: [place] try to get stronger, try to use my legs and feet   OBJECTIVE:   DIAGNOSTIC FINDINGS:  Right shoulder 3 views: Shoulder is well located.  Subacromial space well-maintained.  Type II downsloping acromion.  No acute fractures or acute findings Right wrist 3 views: No acute fractures.  No subluxation dislocation of  the carpal bones.  Wrist joints well-maintained.  No bony abnormalities.  PATIENT SURVEYS:  FOTO 4  COGNITION:  Overall cognitive status: Within functional limits for tasks assessed     SENSATION: WFL  POSTURE: Decreased R shoulder height, hemiparesis, forward head, flexed trunk, R lateral lean  UPPER EXTREMITY ROM:   Passive ROM Right eval Right 12/01/21  Shoulder flexion 50d w/pain 80  Shoulder extension    Shoulder abduction 40d w/pain  70  Shoulder adduction    Shoulder internal rotation 20d w/ pain   Shoulder external rotation 10d w/pain   Elbow flexion full   Elbow extension 10 d from full extnesion with some pain   Wrist flexion 60   Wrist extension 20   Wrist ulnar deviation    Wrist radial deviation    Wrist pronation    Wrist supination    (Blank rows = not tested)  UPPER EXTREMITY MMT: L WFL  MMT Right eval Left eval  Shoulder flexion 1-   Shoulder extension    Shoulder abduction    Shoulder adduction 2-   Shoulder internal rotation 2-   Shoulder external rotation 1   Middle trapezius    Lower trapezius    Elbow flexion 2+   Elbow extension 2-   Wrist flexion    Wrist extension    Wrist ulnar deviation    Wrist radial deviation    Wrist pronation    Wrist supination    Grip strength (lbs) 2   (Blank rows = not tested)   TUG time: 50 seconds with use of the hemiwalker LE MMT 4-/5  right hip flexion, knee ext, knee flexion, ankle DF, right hip abduction 3+/5, PF 3+/5  Using  hemiwalker, decrease R foot clearance, R foot ER, decrease hip flexion, decreased stance time on R LE, decreased step length, foot drop   Instability tests: Apprehension test: positive  and Sulcus sign: positive   JOINT MOBILITY TESTING:  Pain with PROM, muscle guarding, instability in R shoulder, scapular winging  PALPATION:  TTP to the right upper lateral arm   TODAY'S TREATMENT:  12/10/21 PROM of the right wrist and shoulder, some approximation of the fingers and shoulder with motions Red tband row and tricep extension Red tband ER/IR UE ranger various angles trying to get him to move out of his comfort range 1# biceps Nustep level  5 x 6 minutes   12/08/21 Sliding board flexion, in and out in lap for ER/IR 1# biceps Yellow tband ER/IR Yellow tband row Yellow tband triceps Yellow tband extension Wrist and finger motions passive and active Right shoulder isometrics Supine ER/IR and biceps Supine AAROM, PROM various angles  Nustep level 4 x 6 minutes  12/03/21 Nustep level 5 x 6 minutes UE ranger all motions some with some help various angles Isometrics all motions that he can do and 2x10 each Yellow tband ER/IR with some elbow support Yellow tband triceps 1# biceps curl in a limited ROM PROM right shoulder Leg press 20# both legs, no weight right leg only  12/01/21 UE ranger with and without assist multiple angles and motions AAROM of the right UE motions and angles Sliding board slides of the UE Yellow tband ER/IR, some rows and some pushes Approximation of the right shoulder in various angles Nustep level 5 x 6 mintues  11/19/21 Nustep without the right UE Level 4 x 5 minutes UE ranger circles and some ER/IR Yellow tband IR and row  PATIENT EDUCATION: Education details: POC Person educated: Patient and brother Education method: Customer service manager Education comprehension: verbalized understanding   HOME EXERCISE  PROGRAM: TBD  ASSESSMENT:  CLINICAL IMPRESSION: Patient is self limiting due to pain, he will usually say he can't does better with AAROM and then I can back off of helping and he will do it.  With PROM he will stop the motion due to pain, I did more approximation today and this seemed to help some.  OBJECTIVE IMPAIRMENTS Abnormal gait, decreased mobility, difficulty walking, decreased ROM, decreased strength, decreased safety awareness, and pain.   ACTIVITY LIMITATIONS carrying, lifting, transfers, bathing, toileting, dressing, reach over head, and locomotion level  PARTICIPATION LIMITATIONS: cleaning, laundry, driving, community activity, and yard work  PERSONAL FACTORS Age, Time since onset of injury/illness/exacerbation, and 3+ comorbidities: chronic pain, acute CVA, stroke, OA  are also affecting patient's functional outcome.   REHAB POTENTIAL: Fair due to time since stroke and increased deficits on R side  CLINICAL DECISION MAKING: Evolving/moderate complexity  EVALUATION COMPLEXITY: Moderate   GOALS: Goals reviewed with patient? No  SHORT TERM GOALS: Target date: 12/10/21 Patient will be independent with initial HEP.  Goal status:ongoing   LONG TERM GOALS: Target date: 02/19/22  1.  Patient will report 75% improvement in R shoulder pain to improve QOL.  Goal status: INITIAL  2.  Patient to demonstrate improved upright posture with posterior shoulder girdle engaged to promote improved glenohumeral joint mobility. Baseline: decreased shoulder height on R side, decreased shoulder stability, scapular winging  Goal status: ongoing  3.  Patient to improve R shoulder AROM to  within patient's functional norm  without pain provocation to allow for increased ease of ADLs.  Baseline: measured w/PROM Goal status: INITIAL  4.  Patient will demonstrate improved functional UE strength as demonstrated by >= 3+/5 in all muscle groups . Goal status: INITIAL  5.  Patient will  decrease TUG time to 24 seconds Baseline: 50 seconds Goal status: ongoing    PLAN: PT FREQUENCY: 2x/week  PT DURATION: 12 weeks  PLANNED INTERVENTIONS: Therapeutic exercises, Therapeutic activity, Neuromuscular re-education, Balance training, Gait training, Patient/Family education, Joint mobilization, Stair training, Orthotic/Fit training, and Manual therapy  PLAN FOR NEXT SESSION: iStart works on shoulder function and strength   Kyler Lerette W, PT 12/10/2021, 10:26 AM

## 2021-12-11 ENCOUNTER — Telehealth: Payer: Self-pay

## 2021-12-11 ENCOUNTER — Other Ambulatory Visit: Payer: Self-pay

## 2021-12-11 ENCOUNTER — Other Ambulatory Visit: Payer: Self-pay | Admitting: Family Medicine

## 2021-12-11 DIAGNOSIS — F0631 Mood disorder due to known physiological condition with depressive features: Secondary | ICD-10-CM

## 2021-12-11 DIAGNOSIS — Z8673 Personal history of transient ischemic attack (TIA), and cerebral infarction without residual deficits: Secondary | ICD-10-CM

## 2021-12-11 DIAGNOSIS — I10 Essential (primary) hypertension: Secondary | ICD-10-CM

## 2021-12-11 MED ORDER — TAMSULOSIN HCL 0.4 MG PO CAPS
0.4000 mg | ORAL_CAPSULE | Freq: Every day | ORAL | 0 refills | Status: DC
  Filled 2021-12-11: qty 30, 30d supply, fill #0

## 2021-12-11 MED ORDER — CITALOPRAM HYDROBROMIDE 20 MG PO TABS
20.0000 mg | ORAL_TABLET | Freq: Every day | ORAL | 0 refills | Status: DC
  Filled 2021-12-11: qty 30, 30d supply, fill #0

## 2021-12-11 MED ORDER — AMLODIPINE BESYLATE 5 MG PO TABS
5.0000 mg | ORAL_TABLET | Freq: Every day | ORAL | 0 refills | Status: DC
  Filled 2021-12-11: qty 30, 30d supply, fill #0

## 2021-12-11 MED ORDER — OMEPRAZOLE 40 MG PO CPDR
40.0000 mg | DELAYED_RELEASE_CAPSULE | Freq: Every day | ORAL | 0 refills | Status: DC
  Filled 2021-12-11 – 2021-12-12 (×2): qty 30, 30d supply, fill #0

## 2021-12-11 MED ORDER — ROSUVASTATIN CALCIUM 20 MG PO TABS
20.0000 mg | ORAL_TABLET | Freq: Every day | ORAL | 0 refills | Status: DC
  Filled 2021-12-11: qty 30, 30d supply, fill #0

## 2021-12-11 MED ORDER — CITALOPRAM HYDROBROMIDE 20 MG PO TABS
20.0000 mg | ORAL_TABLET | Freq: Every day | ORAL | 1 refills | Status: DC
  Filled 2021-12-12: qty 100, 100d supply, fill #0

## 2021-12-11 MED ORDER — AMLODIPINE BESYLATE 5 MG PO TABS
5.0000 mg | ORAL_TABLET | Freq: Every day | ORAL | 1 refills | Status: DC
  Filled 2021-12-12: qty 100, 100d supply, fill #0

## 2021-12-11 MED ORDER — TAMSULOSIN HCL 0.4 MG PO CAPS
0.4000 mg | ORAL_CAPSULE | Freq: Every day | ORAL | 1 refills | Status: DC
  Filled 2021-12-12: qty 100, 100d supply, fill #0

## 2021-12-11 MED ORDER — ROSUVASTATIN CALCIUM 20 MG PO TABS
20.0000 mg | ORAL_TABLET | Freq: Every day | ORAL | 1 refills | Status: DC
  Filled 2021-12-12: qty 100, 100d supply, fill #0

## 2021-12-11 NOTE — Telephone Encounter (Signed)
Refill has been sent to pharmacy.  

## 2021-12-11 NOTE — Telephone Encounter (Signed)
Pt states that he is leaving the country for 4 months and needs these medications to last him: Flomax Crestor Citalopram Amlodipine Sent to [facility] he states that he is leaving tomorrow night

## 2021-12-12 ENCOUNTER — Other Ambulatory Visit: Payer: Self-pay

## 2021-12-12 NOTE — Telephone Encounter (Signed)
Noted  

## 2021-12-15 ENCOUNTER — Encounter: Payer: Self-pay | Admitting: Physical Therapy

## 2021-12-15 ENCOUNTER — Ambulatory Visit: Payer: Medicare Other | Admitting: Physical Therapy

## 2021-12-15 DIAGNOSIS — Z8673 Personal history of transient ischemic attack (TIA), and cerebral infarction without residual deficits: Secondary | ICD-10-CM

## 2021-12-15 DIAGNOSIS — R29818 Other symptoms and signs involving the nervous system: Secondary | ICD-10-CM | POA: Diagnosis not present

## 2021-12-15 DIAGNOSIS — R293 Abnormal posture: Secondary | ICD-10-CM

## 2021-12-15 DIAGNOSIS — M25511 Pain in right shoulder: Secondary | ICD-10-CM | POA: Diagnosis not present

## 2021-12-15 DIAGNOSIS — I69351 Hemiplegia and hemiparesis following cerebral infarction affecting right dominant side: Secondary | ICD-10-CM | POA: Diagnosis not present

## 2021-12-15 DIAGNOSIS — M6281 Muscle weakness (generalized): Secondary | ICD-10-CM

## 2021-12-15 DIAGNOSIS — R2689 Other abnormalities of gait and mobility: Secondary | ICD-10-CM | POA: Diagnosis not present

## 2021-12-15 NOTE — Therapy (Signed)
OUTPATIENT PHYSICAL THERAPY SHOULDER TREATMENT   Patient Name: Aaron Dennis MRN: [id number] DOB:[birth year], 66 y.o., male Today's Date: 12/15/2021   PT End of Session - 12/15/21 1026     Visit Number 6    Date for PT Re-Evaluation 02/19/22    Authorization Type [employer] Medicare    PT Start Time 1021    PT Stop Time 1100    PT Time Calculation (min) 39 min    Activity Tolerance Patient tolerated treatment well    Behavior During Therapy WFL for tasks assessed/performed             Past Medical History:  Diagnosis Date   Constipation    Depression    GERD (gastroesophageal reflux disease)    Hypertension    Lung nodule    7 mm LUL nodule   Polycythemia    Stroke ([place]) 05/01/2021   acute left frontoparietal infarct, residual right-sided hemiparesis   Vertebral artery aneurysm (HCC)    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   Vitamin D deficiency    Past Surgical History:  Procedure Laterality Date   IR RADIOLOGIST EVAL & MGMT  07/02/2021   IR RADIOLOGIST EVAL & MGMT  07/22/2021   LIPOMA EXCISION     SHOULDER ARTHROSCOPY WITH SUBACROMIAL DECOMPRESSION Right 10/23/2021   Procedure: RIGHT SHOULDER ARTHROSCOPY WITH DEBRIDEMENT, SUBACROMIAL DECOMPRESSION, MANIPULATION UNDER ANESTHESIA;  Surgeon: Mcarthur Rossetti, MD;  Location: [facility];  Service: Orthopedics;  Laterality: Right;   Patient Active Problem List   Diagnosis Date Noted   Arthrofibrosis of right shoulder 10/23/2021   Aneurysm, cerebral, nonruptured 08/13/2021   Status post stroke due to cerebrovascular disease    Cerebral edema ([place]) 04/29/2021   Stroke (cerebrum) ([place]) 04/29/2021   Right sided weakness    Acute CVA (cerebrovascular accident) ([place]) 04/27/2021   Primary osteoarthritis of left knee 01/13/2019   Primary osteoarthritis of right knee 01/13/2019   Flat foot 11/28/2017   Tendonitis of foot 11/28/2017   Polycythemia 05/12/2017   Vitamin D deficiency 04/21/2017   Bony sclerosis  08/05/2016   Lung nodule    Chronic pain of both knees 12/05/2015   Gastroesophageal reflux disease without esophagitis 05/17/2014   Essential hypertension 05/17/2014   Coronary artery calcification 08/17/2012   Abnormal LFTs (liver function tests) 07/11/2012   Chronic abdominal pain 06/24/2012   Right-sided chest wall pain 06/24/2012   Constipation, chronic 06/24/2012   Hyperbilirubinemia 06/24/2012   Chronic back pain 06/24/2012   DDD (degenerative disc disease), thoracolumbar 06/24/2012   History of nephrolithiasis 06/24/2012   Side pain 06/24/2012   Low back pain at multiple sites 06/24/2012    PCP: [name]  REFERRING PROVIDER: [name]  REFERRING DIAG: M25.511  THERAPY DIAG:  Hemiplegia and hemiparesis following cerebral infarction affecting right dominant side (HCC)  Muscle weakness (generalized)  Acute pain of right shoulder  Other abnormalities of gait and mobility  Other symptoms and signs involving the nervous system  Status post stroke due to cerebrovascular disease  Abnormal posture  Rationale for Evaluation and Treatment Rehabilitation  ONSET DATE: 07/15/21  SUBJECTIVE:  SUBJECTIVE STATEMENT: Reports no changes, still pain with any movements PERTINENT HISTORY: Acute CVA 3/19  PAIN:  Are you having pain? Yes: NPRS scale: 7/10 Pain location: R shoulder Pain description: sharp Aggravating factors: movement, sleeping on R  Relieving factors: Tylenol and meloxicam  can get down to 3/10 with mm rub and sleep  PRECAUTIONS: Fall  WEIGHT BEARING RESTRICTIONS No  FALLS:  Has patient fallen in last 6 months? Yes. Number of falls 4 reports no falls in the past few months  LIVING ENVIRONMENT: Lives with: lives with their family Lives in: House/apartment Stairs:  Yes: External: 2 steps; none Has following equipment at home: Single point cane, Hemi walker, Wheelchair (manual), and Grab bars  OCCUPATION: retired  PLOF: [place] try to get stronger, try to use my legs and feet   OBJECTIVE:   DIAGNOSTIC FINDINGS:  Right shoulder 3 views: Shoulder is well located.  Subacromial space well-maintained.  Type II downsloping acromion.  No acute fractures or acute findings Right wrist 3 views: No acute fractures.  No subluxation dislocation of  the carpal bones.  Wrist joints well-maintained.  No bony abnormalities.  PATIENT SURVEYS:  FOTO 4  COGNITION:  Overall cognitive status: Within functional limits for tasks assessed     SENSATION: WFL  POSTURE: Decreased R shoulder height, hemiparesis, forward head, flexed trunk, R lateral lean  UPPER EXTREMITY ROM:   Passive ROM Right eval Right 12/01/21 Right  12/15/21  Shoulder flexion 50d w/pain 80 90  Shoulder extension     Shoulder abduction 40d w/pain  70 72  Shoulder adduction     Shoulder internal rotation 20d w/ pain  30  Shoulder external rotation 10d w/pain  15   Elbow flexion full    Elbow extension 10 d from full extnesion with some pain    Wrist flexion 60    Wrist extension 20    Wrist ulnar deviation     Wrist radial deviation     Wrist pronation     Wrist supination     (Blank rows = not tested)  UPPER EXTREMITY MMT: L WFL  MMT Right eval Left eval  Shoulder flexion 1-   Shoulder extension    Shoulder abduction    Shoulder adduction 2-   Shoulder internal rotation 2-   Shoulder external rotation 1   Middle trapezius    Lower trapezius    Elbow flexion 2+   Elbow extension 2-   Wrist flexion    Wrist extension    Wrist ulnar deviation    Wrist radial deviation    Wrist pronation    Wrist supination    Grip strength (lbs) 2   (Blank rows = not tested)   TUG time: 50 seconds with use of the hemiwalker LE MMT 4-/5  right hip flexion, knee  ext, knee flexion, ankle DF, right hip abduction 3+/5, PF 3+/5  Using hemiwalker, decrease R foot clearance, R foot ER, decrease hip flexion, decreased stance time on R LE, decreased step length, foot drop   Instability tests: Apprehension test: positive  and Sulcus sign: positive   JOINT MOBILITY TESTING:  Pain with PROM, muscle guarding, instability in R shoulder, scapular winging  PALPATION:  TTP to the right upper lateral arm   TODAY'S TREATMENT:  12/15/21 Nustep level 5 x 6 minutes PROM of the fingers, elbow, wrist and shoulder some approximation with movements Sliding board and pillow case active motions with shoulder 1# Pro/sup 1# biceps Red tband triceps  Red tband ER Red tband row  12/10/21 PROM of the right wrist and shoulder, some approximation of the fingers and shoulder with motions Red tband row and tricep extension Red tband ER/IR UE ranger various angles trying to get him to move out of his comfort range 1# biceps Nustep level 5 x 6 minutes   12/08/21 Sliding board flexion, in and out in lap for ER/IR 1# biceps Yellow tband ER/IR Yellow tband row Yellow tband triceps Yellow tband extension Wrist and finger motions passive and active Right shoulder isometrics Supine ER/IR and biceps Supine AAROM, PROM various angles  Nustep level 4 x 6 minutes  12/03/21 Nustep level 5 x 6 minutes UE ranger all motions some with some help various angles Isometrics all motions that he can do and 2x10 each Yellow tband ER/IR with some elbow support Yellow tband triceps 1# biceps curl in a limited ROM PROM right shoulder Leg press 20# both legs, no weight right leg only  12/01/21 UE ranger with and without assist multiple angles and motions AAROM of the right UE motions and angles Sliding board slides of the UE Yellow tband ER/IR, some rows and some pushes Approximation of the right shoulder in various angles Nustep level 5 x 6 mintues  PATIENT  EDUCATION: Education details: POC Person educated: Patient and brother Education method: Customer service manager Education comprehension: verbalized understanding   HOME EXERCISE PROGRAM: TBD  ASSESSMENT:  CLINICAL IMPRESSION: Patient is self limiting due to pain, his ROM is better passively so he is allowing more motions. He is very slow moving.  Has some tone in the right shoulder with the motions.  I am using some approximation with the motions to see if this helps  OBJECTIVE IMPAIRMENTS Abnormal gait, decreased mobility, difficulty walking, decreased ROM, decreased strength, decreased safety awareness, and pain.   ACTIVITY LIMITATIONS carrying, lifting, transfers, bathing, toileting, dressing, reach over head, and locomotion level  PARTICIPATION LIMITATIONS: cleaning, laundry, driving, community activity, and yard work  PERSONAL FACTORS Age, Time since onset of injury/illness/exacerbation, and 3+ comorbidities: chronic pain, acute CVA, stroke, OA  are also affecting patient's functional outcome.   REHAB POTENTIAL: Fair due to time since stroke and increased deficits on R side  CLINICAL DECISION MAKING: Evolving/moderate complexity  EVALUATION COMPLEXITY: Moderate   GOALS: Goals reviewed with patient? No  SHORT TERM GOALS: Target date: 12/10/21 Patient will be independent with initial HEP.  Goal status:ongoing   LONG TERM GOALS: Target date: 02/19/22  1.  Patient will report 75% improvement in R shoulder pain to improve QOL.  Goal status: ongoing  2.  Patient to demonstrate improved upright posture with posterior shoulder girdle engaged to promote improved glenohumeral joint mobility. Baseline: decreased shoulder height on R side, decreased shoulder stability, scapular winging  Goal status: ongoing  3.  Patient to improve R shoulder AROM to  within patient's functional norm  without pain provocation to allow for increased ease of ADLs.  Baseline: measured  w/PROM Goal status: INITIAL  4.  Patient will demonstrate improved functional UE strength as demonstrated by >= 3+/5 in all muscle groups . Goal status: INITIAL  5.  Patient will decrease TUG time to 24 seconds Baseline: 50 seconds Goal status: ongoing    PLAN: PT FREQUENCY: 2x/week  PT DURATION: 12 weeks  PLANNED INTERVENTIONS: Therapeutic exercises, Therapeutic activity, Neuromuscular re-education, Balance training, Gait training, Patient/Family education, Joint mobilization, Stair training, Orthotic/Fit training, and Manual therapy  PLAN FOR NEXT SESSION: Start works on shoulder function and  strength   Sumner Boast, PT 12/15/2021, 10:27 AM

## 2021-12-17 ENCOUNTER — Ambulatory Visit: Payer: Medicare Other | Admitting: Physical Therapy

## 2021-12-19 DIAGNOSIS — M25561 Pain in right knee: Secondary | ICD-10-CM | POA: Diagnosis not present

## 2021-12-19 DIAGNOSIS — R531 Weakness: Secondary | ICD-10-CM | POA: Diagnosis not present

## 2021-12-19 DIAGNOSIS — I639 Cerebral infarction, unspecified: Secondary | ICD-10-CM | POA: Diagnosis not present

## 2021-12-19 DIAGNOSIS — Q782 Osteopetrosis: Secondary | ICD-10-CM | POA: Diagnosis not present

## 2021-12-19 DIAGNOSIS — M549 Dorsalgia, unspecified: Secondary | ICD-10-CM | POA: Diagnosis not present

## 2021-12-22 ENCOUNTER — Ambulatory Visit: Payer: Medicare Other | Admitting: Physical Therapy

## 2021-12-24 ENCOUNTER — Ambulatory Visit: Payer: Medicare Other | Admitting: Physical Therapy

## 2022-01-07 ENCOUNTER — Ambulatory Visit: Payer: Medicare Other | Admitting: Family Medicine

## 2022-01-18 DIAGNOSIS — M549 Dorsalgia, unspecified: Secondary | ICD-10-CM | POA: Diagnosis not present

## 2022-01-18 DIAGNOSIS — Q782 Osteopetrosis: Secondary | ICD-10-CM | POA: Diagnosis not present

## 2022-01-18 DIAGNOSIS — R531 Weakness: Secondary | ICD-10-CM | POA: Diagnosis not present

## 2022-01-18 DIAGNOSIS — M25561 Pain in right knee: Secondary | ICD-10-CM | POA: Diagnosis not present

## 2022-01-18 DIAGNOSIS — I639 Cerebral infarction, unspecified: Secondary | ICD-10-CM | POA: Diagnosis not present

## 2022-02-18 DIAGNOSIS — M549 Dorsalgia, unspecified: Secondary | ICD-10-CM | POA: Diagnosis not present

## 2022-02-18 DIAGNOSIS — I639 Cerebral infarction, unspecified: Secondary | ICD-10-CM | POA: Diagnosis not present

## 2022-02-18 DIAGNOSIS — M25561 Pain in right knee: Secondary | ICD-10-CM | POA: Diagnosis not present

## 2022-02-18 DIAGNOSIS — Q782 Osteopetrosis: Secondary | ICD-10-CM | POA: Diagnosis not present

## 2022-02-18 DIAGNOSIS — R531 Weakness: Secondary | ICD-10-CM | POA: Diagnosis not present

## 2022-03-21 DIAGNOSIS — R531 Weakness: Secondary | ICD-10-CM | POA: Diagnosis not present

## 2022-03-21 DIAGNOSIS — M549 Dorsalgia, unspecified: Secondary | ICD-10-CM | POA: Diagnosis not present

## 2022-03-21 DIAGNOSIS — I639 Cerebral infarction, unspecified: Secondary | ICD-10-CM | POA: Diagnosis not present

## 2022-03-21 DIAGNOSIS — Q782 Osteopetrosis: Secondary | ICD-10-CM | POA: Diagnosis not present

## 2022-03-21 DIAGNOSIS — M25561 Pain in right knee: Secondary | ICD-10-CM | POA: Diagnosis not present

## 2022-04-16 ENCOUNTER — Encounter: Payer: Self-pay | Admitting: Radiology

## 2022-04-21 ENCOUNTER — Encounter: Payer: Self-pay | Admitting: Internal Medicine

## 2022-04-21 ENCOUNTER — Telehealth: Payer: Self-pay | Admitting: Family Medicine

## 2022-04-21 NOTE — Telephone Encounter (Signed)
Called patient to schedule Medicare Annual Wellness Visit (AWV). Left message for patient to call back and schedule Medicare Annual Wellness Visit (AWV).  Last date of AWV:  due   awvi 08/09/21 per [facility]   If any questions, please contact me at [phone].  Thank you ,  Barkley Boards AWV direct phone # [phone]

## 2022-04-21 NOTE — Telephone Encounter (Signed)
Correction did not leave message   patient did not have voicemail set up

## 2022-05-27 ENCOUNTER — Ambulatory Visit (INDEPENDENT_AMBULATORY_CARE_PROVIDER_SITE_OTHER): Payer: 59 | Admitting: Orthopaedic Surgery

## 2022-05-27 ENCOUNTER — Other Ambulatory Visit (INDEPENDENT_AMBULATORY_CARE_PROVIDER_SITE_OTHER): Payer: 59

## 2022-05-27 ENCOUNTER — Encounter: Payer: Self-pay | Admitting: Orthopaedic Surgery

## 2022-05-27 ENCOUNTER — Other Ambulatory Visit: Payer: Self-pay

## 2022-05-27 DIAGNOSIS — M170 Bilateral primary osteoarthritis of knee: Secondary | ICD-10-CM | POA: Diagnosis not present

## 2022-05-27 DIAGNOSIS — M1712 Unilateral primary osteoarthritis, left knee: Secondary | ICD-10-CM | POA: Diagnosis not present

## 2022-05-27 DIAGNOSIS — M1711 Unilateral primary osteoarthritis, right knee: Secondary | ICD-10-CM

## 2022-05-27 MED ORDER — LIDOCAINE HCL 1 % IJ SOLN
3.0000 mL | INTRAMUSCULAR | Status: AC | PRN
Start: 2022-05-27 — End: 2022-05-27
  Administered 2022-05-27: 3 mL

## 2022-05-27 MED ORDER — METHYLPREDNISOLONE ACETATE 40 MG/ML IJ SUSP
40.0000 mg | INTRAMUSCULAR | Status: AC | PRN
Start: 2022-05-27 — End: 2022-05-27
  Administered 2022-05-27: 40 mg via INTRA_ARTICULAR

## 2022-05-27 NOTE — Progress Notes (Signed)
Office Visit Note   Patient: Aaron Dennis           Date of Birth: 10-03-1955           MRN: [id number] Visit Date: 05/27/2022              Requested by: [name], MD [address] [place] [address] [place],  [place] [address] PCP: [name], MD   Assessment & Plan: Visit Diagnoses:  1. Bilateral primary osteoarthritis of knee     Plan: Questions were encouraged and answered by Dr. Raye Sorrow myself.  Will send him to formal physical therapy for bilateral knee range of motion, strengthening and also to work on gait balance.  He will follow-up with [place] as needed.  Follow-Up Instructions: Return if symptoms worsen or fail to improve.   Orders:  Orders Placed This Encounter  Procedures   Large Joint Inj   XR Knee 1-2 Views Right   XR Knee 1-2 Views Left   No orders of the defined types were placed in this encounter.     Procedures: Large Joint Inj: bilateral knee on 05/27/2022 10:59 AM Indications: pain Details: 22 G 1.5 in needle, anterolateral approach  Arthrogram: No  Medications (Right): 3 mL lidocaine 1 %; 40 mg methylPREDNISolone acetate 40 MG/ML Medications (Left): 3 mL lidocaine 1 %; 40 mg methylPREDNISolone acetate 40 MG/ML Outcome: tolerated well, no immediate complications Procedure, treatment alternatives, risks and benefits explained, specific risks discussed. Consent was given by the patient. Immediately prior to procedure a time out was called to verify the correct patient, procedure, equipment, support staff and site/side marked as required. Patient was prepped and draped in the usual sterile fashion.       Clinical Data: No additional findings.   Subjective: Chief Complaint  Patient presents with   Left Knee - Pain   Right Knee - Pain    HPI [name] returns today for follow-up for bilateral knee pain.  Again insulin is at the stroke on the right side 2003 and this makes his gait and balance slightly off.  States that he has pain in both  knees if he stands for too long.  He has had no new injury to either knee.  Has had prior injections in both knees that helped.  Describes no mechanical symptoms of either knee.  Review of Systems  Constitutional:  Negative for chills and fever.     Objective: Vital Signs: There were no vitals taken for this visit.  Physical Exam Constitutional:      Appearance: He is not ill-appearing or diaphoretic.  Pulmonary:     Effort: Pulmonary effort is normal.  Neurological:     Mental Status: He is alert and oriented to person, place, and time.     Ortho Exam Bilateral knees full flexion actively.  Left knee full extension actively.  Right knee active extension he lacks approximately 10 to 15 degrees.  Passively full extension.  Passive range of motion bilateral knees reveals patellofemoral crepitus.  He is nontender along medial lateral joint line of both knees.  No instability valgus varus stress in either knee.  No abnormal warmth erythema or effusion of either knee. Specialty Comments:  No specialty comments available.  Imaging: XR Knee 1-2 Views Right  Result Date: 05/27/2022 Right knee 2 views: Knee is well located.  No acute fractures or acute finding.  Mild patellofemoral arthritic changes.  Otherwise knee is really preserved.  XR Knee 1-2 Views Left  Result  Date: 05/27/2022 Left knee 2 views: Knee is well-preserved.  Normal bony density.  Mild patellofemoral arthritic changes.  Otherwise knee joint is well-preserved.  No acute findings    PMFS History: Patient Active Problem List   Diagnosis Date Noted   Arthrofibrosis of right shoulder 10/23/2021   Aneurysm, cerebral, nonruptured 08/13/2021   Status post stroke due to cerebrovascular disease    Cerebral edema 04/29/2021   Stroke (cerebrum) 04/29/2021   Right sided weakness    Acute CVA (cerebrovascular accident) 04/27/2021   Primary osteoarthritis of left knee 01/13/2019   Primary osteoarthritis of right knee  01/13/2019   Flat foot 11/28/2017   Tendonitis of foot 11/28/2017   Polycythemia 05/12/2017   Vitamin D deficiency 04/21/2017   Bony sclerosis 08/05/2016   Lung nodule    Chronic pain of both knees 12/05/2015   Gastroesophageal reflux disease without esophagitis 05/17/2014   Essential hypertension 05/17/2014   Coronary artery calcification 08/17/2012   Abnormal LFTs (liver function tests) 07/11/2012   Chronic abdominal pain 06/24/2012   Right-sided chest wall pain 06/24/2012   Constipation, chronic 06/24/2012   Hyperbilirubinemia 06/24/2012   Chronic back pain 06/24/2012   DDD (degenerative disc disease), thoracolumbar 06/24/2012   History of nephrolithiasis 06/24/2012   Side pain 06/24/2012   Low back pain at multiple sites 06/24/2012   Past Medical History:  Diagnosis Date   Constipation    Depression    GERD (gastroesophageal reflux disease)    Hypertension    Lung nodule    7 mm LUL nodule   Polycythemia    Stroke ([facility]) 05/01/2021   acute left frontoparietal infarct, residual right-sided hemiparesis   Vertebral artery aneurysm (HCC)    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   Vitamin D deficiency     Family History  Problem Relation Age of Onset   Healthy Mother    Diabetes Brother    Colon polyps Neg Hx    Colon cancer Neg Hx    Esophageal cancer Neg Hx    Rectal cancer Neg Hx    Stomach cancer Neg Hx     Past Surgical History:  Procedure Laterality Date   IR RADIOLOGIST EVAL & MGMT  07/02/2021   IR RADIOLOGIST EVAL & MGMT  07/22/2021   LIPOMA EXCISION     SHOULDER ARTHROSCOPY WITH SUBACROMIAL DECOMPRESSION Right 10/23/2021   Procedure: RIGHT SHOULDER ARTHROSCOPY WITH DEBRIDEMENT, SUBACROMIAL DECOMPRESSION, MANIPULATION UNDER ANESTHESIA;  Surgeon: Kathryne Hitch, MD;  Location: [facility];  Service: Orthopedics;  Laterality: Right;   Social History   Occupational History   Not on file  Tobacco Use   Smoking status: Former   Smokeless  tobacco: Never   Tobacco comments:    quit Jan 2018  Vaping Use   Vaping Use: Never used  Substance and Sexual Activity   Alcohol use: No   Drug use: No   Sexual activity: Not Currently

## 2022-06-01 ENCOUNTER — Other Ambulatory Visit: Payer: Self-pay | Admitting: Internal Medicine

## 2022-06-01 ENCOUNTER — Telehealth: Payer: Self-pay | Admitting: Internal Medicine

## 2022-06-01 DIAGNOSIS — D751 Secondary polycythemia: Secondary | ICD-10-CM

## 2022-06-01 NOTE — Telephone Encounter (Signed)
Scheduled per 04/22 in-basket message, patient has been called and notified.

## 2022-06-09 ENCOUNTER — Inpatient Hospital Stay (HOSPITAL_BASED_OUTPATIENT_CLINIC_OR_DEPARTMENT_OTHER): Payer: 59 | Admitting: Internal Medicine

## 2022-06-09 ENCOUNTER — Inpatient Hospital Stay: Payer: 59 | Attending: Internal Medicine

## 2022-06-09 ENCOUNTER — Other Ambulatory Visit: Payer: Self-pay

## 2022-06-09 VITALS — BP 131/87 | HR 80 | Temp 98.6°F | Resp 13 | Wt 166.5 lb

## 2022-06-09 DIAGNOSIS — I10 Essential (primary) hypertension: Secondary | ICD-10-CM | POA: Diagnosis not present

## 2022-06-09 DIAGNOSIS — Z8673 Personal history of transient ischemic attack (TIA), and cerebral infarction without residual deficits: Secondary | ICD-10-CM | POA: Insufficient documentation

## 2022-06-09 DIAGNOSIS — D751 Secondary polycythemia: Secondary | ICD-10-CM

## 2022-06-09 DIAGNOSIS — R739 Hyperglycemia, unspecified: Secondary | ICD-10-CM | POA: Insufficient documentation

## 2022-06-09 LAB — CMP (CANCER CENTER ONLY)
ALT: 10 U/L (ref 0–44)
AST: 14 U/L — ABNORMAL LOW (ref 15–41)
Albumin: 4 g/dL (ref 3.5–5.0)
Alkaline Phosphatase: 103 U/L (ref 38–126)
Anion gap: 6 (ref 5–15)
BUN: 14 mg/dL (ref 8–23)
CO2: 29 mmol/L (ref 22–32)
Calcium: 9.7 mg/dL (ref 8.9–10.3)
Chloride: 105 mmol/L (ref 98–111)
Creatinine: 0.81 mg/dL (ref 0.61–1.24)
GFR, Estimated: >60 mL/min (ref >60)
Glucose, Bld: 108 mg/dL — ABNORMAL HIGH (ref 70–99)
Potassium: 3.7 mmol/L (ref 3.5–5.1)
Sodium: 140 mmol/L (ref 135–145)
Total Bilirubin: 1.3 mg/dL — ABNORMAL HIGH (ref 0.3–1.2)
Total Protein: 7.1 g/dL (ref 6.5–8.1)

## 2022-06-09 LAB — CBC WITH DIFFERENTIAL (CANCER CENTER ONLY)
Abs Immature Granulocytes: 0.02 10*3/uL (ref 0.00–0.07)
Basophils Absolute: 0.1 10*3/uL (ref 0.0–0.1)
Basophils Relative: 1 %
Eosinophils Absolute: 0 10*3/uL (ref 0.0–0.5)
Eosinophils Relative: 1 %
HCT: 46.9 % (ref 39.0–52.0)
Hemoglobin: 15.2 g/dL (ref 13.0–17.0)
Immature Granulocytes: 0 %
Lymphocytes Relative: 27 %
Lymphs Abs: 2 10*3/uL (ref 0.7–4.0)
MCH: 26.4 pg (ref 26.0–34.0)
MCHC: 32.4 g/dL (ref 30.0–36.0)
MCV: 81.6 fL (ref 80.0–100.0)
Monocytes Absolute: 0.5 10*3/uL (ref 0.1–1.0)
Monocytes Relative: 7 %
Neutro Abs: 4.8 10*3/uL (ref 1.7–7.7)
Neutrophils Relative %: 64 %
Platelet Count: 230 10*3/uL (ref 150–400)
RBC: 5.75 MIL/uL (ref 4.22–5.81)
RDW: 14.4 % (ref 11.5–15.5)
WBC Count: 7.4 10*3/uL (ref 4.0–10.5)
nRBC: 0 % (ref 0.0–0.2)

## 2022-06-09 LAB — IRON AND IRON BINDING CAPACITY (CC-WL,HP ONLY)
Iron: 45 ug/dL (ref 45–182)
Saturation Ratios: 11 % — ABNORMAL LOW (ref 17.9–39.5)
TIBC: 410 ug/dL (ref 250–450)
UIBC: 365 ug/dL (ref 117–376)

## 2022-06-09 LAB — LACTATE DEHYDROGENASE: LDH: 122 U/L (ref 98–192)

## 2022-06-09 NOTE — Progress Notes (Signed)
[facility] [phone]   [phone]  OFFICE PROGRESS NOTE  Aaron Register, MD [address] [place] [address] [place] [address]  DIAGNOSIS: Polycythemia of unclear etiology likely to be reactive in nature but the patient also has nonspecific findings on a bone marrow biopsy and aspirate that was performed around 4 years ago suspicious for early myeloproliferative disorder.  His JAK2 mutation panel at that time was negative.  He was recently diagnosed with stroke likely secondary to hypertension.  PRIOR THERAPY: None  CURRENT THERAPY: Observation  INTERVAL HISTORY: Aaron Dennis 67 y.o. male returns to the clinic today for follow-up visit accompanied by his son.  The patient is feeling fine today with no concerning complaints except for the residual weakness in the right arm and leg after his stroke.  He was working with physical therapy but he had worsening of his condition after his son was killed few months ago with a gunshot.  The patient traveled and stayed in [place] for around 3 months before coming back home.  He denied having any current fatigue but continues to have the weakness especially in the right arm and leg.  He has no nausea, vomiting, diarrhea or constipation.  He has no headache or visual changes.  He did not see his neurologist for several months now.  He is here today for evaluation and repeat blood work.  MEDICAL HISTORY: Past Medical History:  Diagnosis Date   Constipation    Depression    GERD (gastroesophageal reflux disease)    Hypertension    Lung nodule    7 mm LUL nodule   Polycythemia    Stroke ([facility]) 05/01/2021   acute left frontoparietal infarct, residual right-sided hemiparesis   Vertebral artery aneurysm ([facility]CC)    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   Vitamin D deficiency     ALLERGIES:  is allergic to beef-derived products, fish-derived products, and pork-derived products.  MEDICATIONS:   Current Outpatient Medications  Medication Sig Dispense Refill   acetaminophen (TYLENOL) 325 MG tablet Take 1-2 tablets (325-650 mg total) by mouth every 4 (four) hours as needed for mild pain.     amLODipine (NORVASC) 5 MG tablet Take 1 tablet (5 mg total) by mouth daily. 120 tablet 1   aspirin 81 MG EC tablet Take 1 tablet (81 mg total) by mouth daily. Swallow whole. 90 tablet 1   citalopram (CELEXA) 20 MG tablet Take 1 tablet (20 mg total) by mouth daily. 120 tablet 1   HYDROcodone-acetaminophen (NORCO) 5-325 MG tablet Take 1 tablet by mouth every 6 (six) hours as needed for moderate pain. 30 tablet 0   omeprazole (PRILOSEC) 40 MG capsule Take 1 capsule (40 mg total) by mouth daily. 30 capsule 0   polyethylene glycol powder (GLYCOLAX/MIRALAX) 17 GM/SCOOP powder Take 17 grams by mouth daily. (Patient taking differently: Take 17 g by mouth daily as needed for moderate constipation.) 510 g 1   rosuvastatin (CRESTOR) 20 MG tablet Take 1 tablet (20 mg total) by mouth daily. 120 tablet 1   tamsulosin (FLOMAX) 0.4 MG CAPS capsule Take 1 capsule (0.4 mg total) by mouth daily. 120 capsule 1   vitamin B-12 (CYANOCOBALAMIN) 1000 MCG tablet Take 1 tablet (1,000 mcg total) by mouth daily. 90 tablet 1   No current facility-administered medications for this visit.    SURGICAL HISTORY:  Past Surgical History:  Procedure Laterality Date   IR RADIOLOGIST EVAL & MGMT  07/02/2021  IR RADIOLOGIST EVAL & MGMT  07/22/2021   LIPOMA EXCISION     SHOULDER ARTHROSCOPY WITH SUBACROMIAL DECOMPRESSION Right 10/23/2021   Procedure: RIGHT SHOULDER ARTHROSCOPY WITH DEBRIDEMENT, SUBACROMIAL DECOMPRESSION, MANIPULATION UNDER ANESTHESIA;  Surgeon: Kathryne Hitch, MD;  Location: [facility];  Service: Orthopedics;  Laterality: Right;    REVIEW OF SYSTEMS:  A comprehensive review of systems was negative except for: Neurological: positive for weakness   PHYSICAL EXAMINATION: General appearance: alert, cooperative,  fatigued, and no distress Head: Normocephalic, without obvious abnormality, atraumatic Neck: no adenopathy, no JVD, supple, symmetrical, trachea midline, and thyroid not enlarged, symmetric, no tenderness/mass/nodules Lymph nodes: Cervical, supraclavicular, and axillary nodes normal. Resp: clear to auscultation bilaterally Back: symmetric, no curvature. ROM normal. No CVA tenderness. Cardio: regular rate and rhythm, S1, S2 normal, no murmur, click, rub or gallop GI: soft, non-tender; bowel sounds normal; no masses,  no organomegaly Extremities: 2/5 muscle strength in the right upper extremity and 3/5 in the right lower extremity  ECOG PERFORMANCE STATUS: 1 - Symptomatic but completely ambulatory  Blood pressure 131/87, pulse 80, temperature 98.6 F (37 C), temperature source Temporal, resp. rate 13, weight 166 lb 8 oz (75.5 kg), SpO2 100 %.  LABORATORY DATA: Lab Results  Component Value Date   WBC 7.4 06/09/2022   HGB 15.2 06/09/2022   HCT 46.9 06/09/2022   MCV 81.6 06/09/2022   PLT 230 06/09/2022      Chemistry      Component Value Date/Time   NA 140 10/07/2021 1024   K 3.9 10/07/2021 1024   CL 101 10/07/2021 1024   CO2 23 10/07/2021 1024   BUN 9 10/07/2021 1024   CREATININE 0.78 10/07/2021 1024   CREATININE 0.91 06/25/2021 0810   CREATININE 1.02 12/05/2015 1109      Component Value Date/Time   CALCIUM 9.2 10/07/2021 1024   ALKPHOS 112 09/03/2021 0936   AST 19 09/03/2021 0936   AST 21 06/25/2021 0810   ALT 16 09/03/2021 0936   ALT 22 06/25/2021 0810   BILITOT 1.9 (H) 09/03/2021 0936   BILITOT 2.4 (H) 06/25/2021 0810       RADIOGRAPHIC STUDIES: XR Knee 1-2 Views Right  Result Date: 05/27/2022 Right knee 2 views: Knee is well located.  No acute fractures or acute finding.  Mild patellofemoral arthritic changes.  Otherwise knee is really preserved.  XR Knee 1-2 Views Left  Result Date: 05/27/2022 Left knee 2 views: Knee is well-preserved.  Normal bony density.   Mild patellofemoral arthritic changes.  Otherwise knee joint is well-preserved.  No acute findings   ASSESSMENT AND PLAN: This is a very pleasant 67 years old African male who presented for evaluation of polycythemia after recent stroke diagnosis.  He has extensive work-up for the polycythemia that was unremarkable including bone marrow biopsy and aspirate, hemochromatosis, JAK2 mutations, erythropoietin level. I think his stroke is secondary to his uncontrolled hypertension at that time. He had repeat CBC performed earlier today that was unremarkable for any abnormality.  Comprehensive metabolic panel is good except for slightly elevated blood sugar.  His iron study are within the normal range.  Ferritin level as well as hemochromatosis are still pending. I assured the patient that he is doing fine and he will need to continue his routine follow-up visit and evaluation by his primary care physician as well as neurology. I will see him on as-needed basis at this point but he was advised to call immediately if I can help him in any way. The  patient voices understanding of current disease status and treatment options and is in agreement with the current care plan.  All questions were answered. The patient knows to call the clinic with any problems, questions or concerns. We can certainly see the patient much sooner if necessary.  The total time spent in the appointment was 20 minutes.  Disclaimer: This note was dictated with voice recognition software. Similar sounding words can inadvertently be transcribed and may not be corrected upon review.

## 2022-06-10 ENCOUNTER — Encounter: Payer: Self-pay | Admitting: Internal Medicine

## 2022-06-10 LAB — FERRITIN: Ferritin: 13 ng/mL — ABNORMAL LOW (ref 24–336)

## 2022-06-11 ENCOUNTER — Other Ambulatory Visit: Payer: Self-pay

## 2022-06-11 ENCOUNTER — Encounter: Payer: Self-pay | Admitting: Family Medicine

## 2022-06-11 ENCOUNTER — Ambulatory Visit: Payer: 59 | Attending: Family Medicine | Admitting: Family Medicine

## 2022-06-11 VITALS — BP 134/81 | HR 81 | Wt 166.6 lb

## 2022-06-11 DIAGNOSIS — Z131 Encounter for screening for diabetes mellitus: Secondary | ICD-10-CM

## 2022-06-11 DIAGNOSIS — I10 Essential (primary) hypertension: Secondary | ICD-10-CM | POA: Diagnosis not present

## 2022-06-11 DIAGNOSIS — I69351 Hemiplegia and hemiparesis following cerebral infarction affecting right dominant side: Secondary | ICD-10-CM

## 2022-06-11 DIAGNOSIS — R351 Nocturia: Secondary | ICD-10-CM

## 2022-06-11 DIAGNOSIS — M62838 Other muscle spasm: Secondary | ICD-10-CM | POA: Diagnosis not present

## 2022-06-11 DIAGNOSIS — F0631 Mood disorder due to known physiological condition with depressive features: Secondary | ICD-10-CM | POA: Diagnosis not present

## 2022-06-11 DIAGNOSIS — N401 Enlarged prostate with lower urinary tract symptoms: Secondary | ICD-10-CM | POA: Insufficient documentation

## 2022-06-11 DIAGNOSIS — D751 Secondary polycythemia: Secondary | ICD-10-CM

## 2022-06-11 DIAGNOSIS — Z8673 Personal history of transient ischemic attack (TIA), and cerebral infarction without residual deficits: Secondary | ICD-10-CM | POA: Diagnosis not present

## 2022-06-11 MED ORDER — CITALOPRAM HYDROBROMIDE 20 MG PO TABS
20.0000 mg | ORAL_TABLET | Freq: Every day | ORAL | 1 refills | Status: AC
Start: 2022-06-11 — End: ?
  Filled 2022-06-11: qty 90, 90d supply, fill #0

## 2022-06-11 MED ORDER — OMEPRAZOLE 40 MG PO CPDR
40.0000 mg | DELAYED_RELEASE_CAPSULE | Freq: Every day | ORAL | 1 refills | Status: AC
  Filled 2022-06-11: qty 90, 90d supply, fill #0

## 2022-06-11 MED ORDER — ROSUVASTATIN CALCIUM 20 MG PO TABS
20.0000 mg | ORAL_TABLET | Freq: Every day | ORAL | 1 refills | Status: AC
Start: 2022-06-11 — End: ?
  Filled 2022-06-11: qty 90, 90d supply, fill #0

## 2022-06-11 MED ORDER — BACLOFEN 10 MG PO TABS
10.0000 mg | ORAL_TABLET | Freq: Three times a day (TID) | ORAL | 1 refills | Status: DC
Start: 2022-06-11 — End: 2022-08-25
  Filled 2022-06-11: qty 270, 90d supply, fill #0

## 2022-06-11 MED ORDER — TAMSULOSIN HCL 0.4 MG PO CAPS
0.8000 mg | ORAL_CAPSULE | Freq: Every day | ORAL | 1 refills | Status: DC
Start: 2022-06-11 — End: 2022-08-12
  Filled 2022-06-11: qty 180, 90d supply, fill #0

## 2022-06-11 MED ORDER — AMLODIPINE BESYLATE 5 MG PO TABS
5.0000 mg | ORAL_TABLET | Freq: Every day | ORAL | 1 refills | Status: DC
Start: 2022-06-11 — End: 2022-08-25
  Filled 2022-06-11: qty 90, 90d supply, fill #0

## 2022-06-11 NOTE — Progress Notes (Signed)
Referral to Neurology Pain in right side. Medication refills.

## 2022-06-11 NOTE — Progress Notes (Signed)
Subjective:  Patient ID: Aaron Dennis, male    DOB: [birth year]  Age: 67 y.o. MRN: [id number]  CC: Hypertension   HPI Aaron Dennis is a 67 y.o. year old male with a history of hypertension, polycythemia (s/p phlebotomy in the past), chronic thoracolumbar pain, GERD, CVA of L MCA in 04/2021 here for hospital follow-up.   Interval History:  He did have a follow-up with hematology 2 days ago for his polycythemia with recommendations to follow-up as needed and also with neurology.  Today he complains of pain in his right side which has been present for some time now.  Pain is present when he moves or performs a twisting motion. He did have an abdominal Ultrasound from [place] which only revealed BPH but no other organ abnormalities. He remains on Flomax but continues to experience urinary urgency sometimes acts accidents on himself, he has nocturia. He describes his right arm as 'feeling dead'.  He ambulates with a walker. He ran out of all hs medications as he travel to [place] in [place] for several months. He is needing refills. Past Medical History:  Diagnosis Date   Constipation    Depression    GERD (gastroesophageal reflux disease)    Hypertension    Lung nodule    7 mm LUL nodule   Polycythemia    Stroke (HCC) 05/01/2021   acute left frontoparietal infarct, residual right-sided hemiparesis   Vertebral artery aneurysm (HCC)    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   Vitamin D deficiency     Past Surgical History:  Procedure Laterality Date   IR RADIOLOGIST EVAL & MGMT  07/02/2021   IR RADIOLOGIST EVAL & MGMT  07/22/2021   LIPOMA EXCISION     SHOULDER ARTHROSCOPY WITH SUBACROMIAL DECOMPRESSION Right 10/23/2021   Procedure: RIGHT SHOULDER ARTHROSCOPY WITH DEBRIDEMENT, SUBACROMIAL DECOMPRESSION, MANIPULATION UNDER ANESTHESIA;  Surgeon: Kathryne Hitch, MD;  Location: [facility];  Service: Orthopedics;  Laterality: Right;    Family History  Problem Relation Age  of Onset   Healthy Mother    Diabetes Brother    Colon polyps Neg Hx    Colon cancer Neg Hx    Esophageal cancer Neg Hx    Rectal cancer Neg Hx    Stomach cancer Neg Hx     Social History   Socioeconomic History   Marital status: Married    Spouse name: Not on file   Number of children: Not on file   Years of education: Not on file   Highest education level: Not on file  Occupational History   Not on file  Tobacco Use   Smoking status: Former   Smokeless tobacco: Never   Tobacco comments:    quit Jan 2018  Vaping Use   Vaping Use: Never used  Substance and Sexual Activity   Alcohol use: No   Drug use: No   Sexual activity: Not Currently  Other Topics Concern   Not on file  Social History Narrative   Not on file   Social Determinants of Health   Financial Resource Strain: Not on file  Food Insecurity: Not on file  Transportation Needs: Not on file  Physical Activity: Not on file  Stress: Not on file  Social Connections: Not on file    Allergies  Allergen Reactions   Beef-Derived Products     Cultural preference   Fish-Derived Products     Does not eat fish   Pork-Derived Products Other (See Comments)  Cultural preference     Outpatient Medications Prior to Visit  Medication Sig Dispense Refill   acetaminophen (TYLENOL) 325 MG tablet Take 1-2 tablets (325-650 mg total) by mouth every 4 (four) hours as needed for mild pain.     aspirin 81 MG EC tablet Take 1 tablet (81 mg total) by mouth daily. Swallow whole. 90 tablet 1   HYDROcodone-acetaminophen (NORCO) 5-325 MG tablet Take 1 tablet by mouth every 6 (six) hours as needed for moderate pain. 30 tablet 0   polyethylene glycol powder (GLYCOLAX/MIRALAX) 17 GM/SCOOP powder Take 17 grams by mouth daily. (Patient taking differently: Take 17 g by mouth daily as needed for moderate constipation.) 510 g 1   vitamin B-12 (CYANOCOBALAMIN) 1000 MCG tablet Take 1 tablet (1,000 mcg total) by mouth daily. 90 tablet 1    amLODipine (NORVASC) 5 MG tablet Take 1 tablet (5 mg total) by mouth daily. 120 tablet 1   citalopram (CELEXA) 20 MG tablet Take 1 tablet (20 mg total) by mouth daily. 120 tablet 1   omeprazole (PRILOSEC) 40 MG capsule Take 1 capsule (40 mg total) by mouth daily. 30 capsule 0   rosuvastatin (CRESTOR) 20 MG tablet Take 1 tablet (20 mg total) by mouth daily. 120 tablet 1   tamsulosin (FLOMAX) 0.4 MG CAPS capsule Take 1 capsule (0.4 mg total) by mouth daily. 120 capsule 1   No facility-administered medications prior to visit.     ROS Review of Systems  Constitutional:  Negative for activity change and appetite change.  HENT:  Negative for sinus pressure and sore throat.   Respiratory:  Negative for chest tightness, shortness of breath and wheezing.   Cardiovascular:  Negative for chest pain and palpitations.  Gastrointestinal:  Negative for abdominal distention, abdominal pain and constipation.  Genitourinary: Negative.   Musculoskeletal:        See HPI  Psychiatric/Behavioral:  Negative for behavioral problems and dysphoric mood.     Objective:  BP 134/81   Pulse 81   Wt 166 lb 9.6 oz (75.6 kg)   SpO2 100%   BMI 23.90 kg/m      06/11/2022    8:47 AM 06/09/2022    3:24 PM 11/06/2021   10:08 AM  BP/Weight  Systolic BP 134 131 125  Diastolic BP 81 87 85  Wt. (Lbs) 166.6 166.5 157  BMI 23.9 kg/m2 23.89 kg/m2 22.53 kg/m2      Physical Exam Constitutional:      Appearance: He is well-developed.  Cardiovascular:     Rate and Rhythm: Normal rate.     Heart sounds: Normal heart sounds. No murmur heard. Pulmonary:     Effort: Pulmonary effort is normal.     Breath sounds: Normal breath sounds. No wheezing or rales.  Chest:     Chest wall: No tenderness.  Abdominal:     General: Bowel sounds are normal. There is no distension.     Palpations: Abdomen is soft. There is no mass.     Tenderness: There is no abdominal tenderness. There is no right CVA tenderness or left CVA  tenderness.  Musculoskeletal:     Right lower leg: No edema.     Left lower leg: No edema.     Comments: Right hemiparesis Slight tenderness on palpation of right side of thorax and on twisting motion  Neurological:     Mental Status: He is alert and oriented to person, place, and time.  Psychiatric:        Mood  and Affect: Mood normal.        Latest Ref Rng & Units 06/09/2022    2:55 PM 10/07/2021   10:24 AM 09/03/2021    9:36 AM  CMP  Glucose 70 - 99 mg/dL 829  78  562   BUN 8 - 23 mg/dL 14  9  14    Creatinine 0.61 - 1.24 mg/dL 1.30  8.65  7.84   Sodium 135 - 145 mmol/L 140  140  140   Potassium 3.5 - 5.1 mmol/L 3.7  3.9  3.1   Chloride 98 - 111 mmol/L 105  101  106   CO2 22 - 32 mmol/L 29  23  24    Calcium 8.9 - 10.3 mg/dL 9.7  9.2  9.0   Total Protein 6.5 - 8.1 g/dL 7.1   7.3   Total Bilirubin 0.3 - 1.2 mg/dL 1.3   1.9   Alkaline Phos 38 - 126 U/L 103   112   AST 15 - 41 U/L 14   19   ALT 0 - 44 U/L 10   16     Lipid Panel     Component Value Date/Time   CHOL 159 04/27/2021 1649   CHOL 198 06/26/2020 0944   TRIG 52 04/27/2021 1649   HDL 29 (L) 04/27/2021 1649   HDL 38 (L) 06/26/2020 0944   CHOLHDL 5.5 04/27/2021 1649   VLDL 10 04/27/2021 1649   LDLCALC 120 (H) 04/27/2021 1649   LDLCALC 147 (H) 06/26/2020 0944    CBC    Component Value Date/Time   WBC 7.4 06/09/2022 1455   WBC 8.2 09/03/2021 0936   RBC 5.75 06/09/2022 1455   HGB 15.2 06/09/2022 1455   HGB 16.3 10/07/2021 1024   HGB 16.5 03/13/2009 0931   HCT 46.9 06/09/2022 1455   HCT 49.6 10/07/2021 1024   HCT 49.8 03/13/2009 0931   PLT 230 06/09/2022 1455   PLT 226 10/07/2021 1024   MCV 81.6 06/09/2022 1455   MCV 83 10/07/2021 1024   MCV 82.2 03/13/2009 0931   MCH 26.4 06/09/2022 1455   MCHC 32.4 06/09/2022 1455   RDW 14.4 06/09/2022 1455   RDW 14.2 10/07/2021 1024   RDW 15.5 (H) 03/13/2009 0931   LYMPHSABS 2.0 06/09/2022 1455   LYMPHSABS 2.1 10/07/2021 1024   LYMPHSABS 2.2 03/13/2009 0931    MONOABS 0.5 06/09/2022 1455   MONOABS 0.5 03/13/2009 0931   EOSABS 0.0 06/09/2022 1455   EOSABS 0.1 10/07/2021 1024   BASOSABS 0.1 06/09/2022 1455   BASOSABS 0.1 10/07/2021 1024   BASOSABS 0.0 03/13/2009 0931    Lab Results  Component Value Date   HGBA1C 4.9 04/27/2021    Assessment & Plan:  1. Essential hypertension Controlled Continue current regimen Counseled on blood pressure goal of less than 130/80, low-sodium, DASH diet, medication compliance, 150 minutes of moderate intensity exercise per week. Discussed medication compliance, adverse effects. - amLODipine (NORVASC) 5 MG tablet; Take 1 tablet (5 mg total) by mouth daily.  Dispense: 90 tablet; Refill: 1  2. Depression due to physical illness Stable - citalopram (CELEXA) 20 MG tablet; Take 1 tablet (20 mg total) by mouth daily.  Dispense: 90 tablet; Refill: 1  3. Status post stroke due to cerebrovascular disease Secondary risk factor modification Continue aspirin, statin - Ambulatory referral to Neurology - LP+Non-HDL Cholesterol - rosuvastatin (CRESTOR) 20 MG tablet; Take 1 tablet (20 mg total) by mouth daily.  Dispense: 90 tablet; Refill: 1  4. Polycythemia Stable Per  hematology follow-up as needed  5. Hemiparesis affecting right side as late effect of cerebrovascular accident (CVA) (HCC) He currently is not undergoing physical therapy but is due to begin in 2 weeks - baclofen (LIORESAL) 10 MG tablet; Take 1 tablet (10 mg total) by mouth 3 (three) times daily.  Dispense: 270 tablet; Refill: 1  6. Benign prostatic hyperplasia with nocturia Uncontrolled Dose of Flomax increased - Ambulatory referral to Urology - tamsulosin (FLOMAX) 0.4 MG CAPS capsule; Take 2 capsules (0.8 mg total) by mouth daily.  Dispense: 180 capsule; Refill: 1  7. Screening for diabetes mellitus - Hemoglobin A1c  8. Muscle spasm This is likely positional from his right hemiparesis His abdominal ultrasound from the hospital in [place] is  reassuring Placed on baclofen He has questions about whether he needs an MRI to further evaluate this but I have advised him physical therapy should be initiated and if symptoms do not improve we can discuss additional management    Meds ordered this encounter  Medications   amLODipine (NORVASC) 5 MG tablet    Sig: Take 1 tablet (5 mg total) by mouth daily.    Dispense:  90 tablet    Refill:  1   citalopram (CELEXA) 20 MG tablet    Sig: Take 1 tablet (20 mg total) by mouth daily.    Dispense:  90 tablet    Refill:  1   omeprazole (PRILOSEC) 40 MG capsule    Sig: Take 1 capsule (40 mg total) by mouth daily.    Dispense:  90 capsule    Refill:  1   rosuvastatin (CRESTOR) 20 MG tablet    Sig: Take 1 tablet (20 mg total) by mouth daily.    Dispense:  90 tablet    Refill:  1   tamsulosin (FLOMAX) 0.4 MG CAPS capsule    Sig: Take 2 capsules (0.8 mg total) by mouth daily.    Dispense:  180 capsule    Refill:  1   baclofen (LIORESAL) 10 MG tablet    Sig: Take 1 tablet (10 mg total) by mouth 3 (three) times daily.    Dispense:  270 tablet    Refill:  1    Follow-up: Return in about 6 months (around 12/12/2022).       [name], MD, FAAFP. [facility] [place], [place] [phone]   06/11/2022, 12:01 PM

## 2022-06-11 NOTE — Patient Instructions (Signed)
Rehabilitation After a Stroke Rehabilitation, also called rehab, can help you gain more independence after a stroke. It can improve your strength. It can also help with your ability to walk, speak, and swallow. Early rehab may be the most helpful. What is stroke rehab? Strokes affect people in different ways. You may need rehab in a hospital or at a skilled nursing facility. You may also receive rehab at home. Rehab may happen over a few months. Rehab teams are often led by a health care provider who is an expert in rehab after a stroke. They help to prevent and treat: Contracture. This is when your muscles and tendons tighten. It can cause your joints to become stiff. Bed sores. These are also known as pressure wounds. Deep vein thrombosis. This is when blood clots form in your veins. It often happens in your legs or thighs. Future strokes. Pain. The inability to control when you urinate or have bowel movements (incontinence). Post-stroke depression. Other rehab team members may include: Therapists. Psychologists. Social workers. Nurses. Types of rehab Physical therapy Physical therapy can help you improve your coordination, balance, and muscle strength. It may involve: Range-of-motion exercises. Exercises to strengthen the muscles used for standing, walking, and other activities. Moving between lying, sitting, and standing. Walking with a cane or walker, if needed. Using stairs.  Occupational therapy Occupational therapy can help you rebuild your skills to do daily tasks. These tasks may include brushing your teeth, going to the bathroom, eating, bathing, and getting dressed. This type of therapy may also help with: Vision. Visual scanning is a technique that is used to prevent falls. Memory and cognitive training. You may learn problem-solving skills and relearn some tasks, such as how to make a phone call. Fine muscle movements. This may include buttoning a shirt or picking up small  objects. Speech-language therapy Speech-language therapy can help you communicate. After a stroke, you may have trouble understanding what people are saying. You may also have trouble writing, speaking, or finding the right word for what you want to say. You may also need speech therapy if you have trouble swallowing when you eat or drink. This type of therapy may include: Ways to strengthen the muscles you use when you swallow. Naming objects or describing pictures. This helps retrain the brain to recognize and remember words. Exercises to strengthen the muscles used for talking. This includes your tongue and lips. Exercises to retrain your brain to understand what you read and hear. General information  Take over-the-counter and prescription medicines only as told by your health care provider. Do not use any products that contain nicotine or tobacco. These include cigarettes, chewing tobacco, and vaping devices, such as e-cigarettes. If you need help quitting, ask your health care provider. Try to involve your family and friends in your recovery. It can help to have others encourage you. Stay connected with your friends, family, and community. This can help you to not feel isolated after a stroke. Do exercises as told by your health care provider. Keep all follow-up visits to make sure all your needs are being met and to catch any new problems early. This information is not intended to replace advice given to you by your health care provider. Make sure you discuss any questions you have with your health care provider. Document Revised: 07/03/2021 Document Reviewed: 07/03/2021 Elsevier Patient Education  2023 [employer]  

## 2022-06-12 LAB — LP+NON-HDL CHOLESTEROL
Cholesterol, Total: 184 mg/dL (ref 100–199)
HDL: 35 mg/dL — ABNORMAL LOW (ref >39)
LDL Chol Calc (NIH): 132 mg/dL — ABNORMAL HIGH (ref 0–99)
Total Non-HDL-Chol (LDL+VLDL): 149 mg/dL — ABNORMAL HIGH (ref 0–129)
Triglycerides: 92 mg/dL (ref 0–149)
VLDL Cholesterol Cal: 17 mg/dL (ref 5–40)

## 2022-06-12 LAB — HEMOGLOBIN A1C
Est. average glucose Bld gHb Est-mCnc: 100 mg/dL
Hgb A1c MFr Bld: 5.1 % (ref 4.8–5.6)

## 2022-06-15 ENCOUNTER — Other Ambulatory Visit: Payer: Self-pay

## 2022-06-17 ENCOUNTER — Ambulatory Visit (INDEPENDENT_AMBULATORY_CARE_PROVIDER_SITE_OTHER): Payer: 59 | Admitting: Orthopaedic Surgery

## 2022-06-17 DIAGNOSIS — M7541 Impingement syndrome of right shoulder: Secondary | ICD-10-CM | POA: Diagnosis not present

## 2022-06-17 DIAGNOSIS — M25511 Pain in right shoulder: Secondary | ICD-10-CM | POA: Diagnosis not present

## 2022-06-17 DIAGNOSIS — G8929 Other chronic pain: Secondary | ICD-10-CM

## 2022-06-17 DIAGNOSIS — M7501 Adhesive capsulitis of right shoulder: Secondary | ICD-10-CM

## 2022-06-17 NOTE — Progress Notes (Signed)
The patient is well-known to me he is a 67 year old gentleman who we have seen several times for his right shoulder.  We even took him to the operating room for an attempted manipulation under anesthesia with an arthroscopic debridement and lysis of adhesions.  He still has painful motion of that shoulder.  A lot of this is the results of a remote stroke that he had affecting his right side.  He still continues to have right shoulder pain that does radiate down to his elbow.  It does not seem to be neck related from my standpoint either.  On exam he is showing definite weakness of both his shoulders but more's more so on the right than the left.  There is also atrophy of the muscles.  I can internally and externally take the shoulder and abducted but it is painful to do so but the shoulder is well located.  At this point I would like to send him to my partner Dr. Steward Drone so he can thoroughly evaluate Mr. Shall meds shoulder and help determine what the next treatment options would be from the potential for surgical intervention to his level is considering any type of ultrasound-guided injections.  I will defer the further care of his shoulders to Dr. Steward Drone.  Apparently he does have physical therapy starting at some point for his knees and overall conditioning.

## 2022-06-22 ENCOUNTER — Telehealth: Payer: Self-pay | Admitting: Family Medicine

## 2022-06-22 NOTE — Telephone Encounter (Signed)
Contacted Aaron Dennis to schedule their annual wellness visit. Appointment made for 06/24/2022.  Thank you,  [facility] Support [facility] Direct dial  [phone]

## 2022-06-23 ENCOUNTER — Ambulatory Visit: Payer: 59 | Attending: Orthopaedic Surgery

## 2022-06-23 ENCOUNTER — Other Ambulatory Visit: Payer: Self-pay

## 2022-06-23 DIAGNOSIS — R2689 Other abnormalities of gait and mobility: Secondary | ICD-10-CM | POA: Insufficient documentation

## 2022-06-23 DIAGNOSIS — M6281 Muscle weakness (generalized): Secondary | ICD-10-CM | POA: Diagnosis not present

## 2022-06-23 DIAGNOSIS — M170 Bilateral primary osteoarthritis of knee: Secondary | ICD-10-CM | POA: Insufficient documentation

## 2022-06-23 DIAGNOSIS — M25562 Pain in left knee: Secondary | ICD-10-CM | POA: Insufficient documentation

## 2022-06-23 DIAGNOSIS — G8929 Other chronic pain: Secondary | ICD-10-CM | POA: Insufficient documentation

## 2022-06-23 DIAGNOSIS — M25561 Pain in right knee: Secondary | ICD-10-CM | POA: Insufficient documentation

## 2022-06-23 NOTE — Therapy (Signed)
OUTPATIENT PHYSICAL THERAPY LOWER EXTREMITY EVALUATION   Patient Name: Aaron Dennis MRN: [id number] DOB:Nov 20, 1955, 67 y.o., male Today's Date: 06/23/2022  END OF SESSION:  PT End of Session - 06/23/22 1315     Visit Number 1    Number of Visits 17    Date for PT Re-Evaluation 08/18/22    Authorization Type [employer] Medicare    PT Start Time 1045    PT Stop Time 1125    PT Time Calculation (min) 40 min    Activity Tolerance Patient tolerated treatment well    Behavior During Therapy WFL for tasks assessed/performed             Past Medical History:  Diagnosis Date   Constipation    Depression    GERD (gastroesophageal reflux disease)    Hypertension    Lung nodule    7 mm LUL nodule   Polycythemia    Stroke (HCC) 05/01/2021   acute left frontoparietal infarct, residual right-sided hemiparesis   Vertebral artery aneurysm (HCC)    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   Vitamin D deficiency    Past Surgical History:  Procedure Laterality Date   IR RADIOLOGIST EVAL & MGMT  07/02/2021   IR RADIOLOGIST EVAL & MGMT  07/22/2021   LIPOMA EXCISION     SHOULDER ARTHROSCOPY WITH SUBACROMIAL DECOMPRESSION Right 10/23/2021   Procedure: RIGHT SHOULDER ARTHROSCOPY WITH DEBRIDEMENT, SUBACROMIAL DECOMPRESSION, MANIPULATION UNDER ANESTHESIA;  Surgeon: Kathryne Hitch, MD;  Location: [facility];  Service: Orthopedics;  Laterality: Right;   Patient Active Problem List   Diagnosis Date Noted   Benign prostatic hyperplasia with nocturia 06/11/2022   Arthrofibrosis of right shoulder 10/23/2021   Aneurysm, cerebral, nonruptured 08/13/2021   Status post stroke due to cerebrovascular disease    Cerebral edema (HCC) 04/29/2021   Stroke (cerebrum) (HCC) 04/29/2021   Right sided weakness    Acute CVA (cerebrovascular accident) (HCC) 04/27/2021   Primary osteoarthritis of left knee 01/13/2019   Primary osteoarthritis of right knee 01/13/2019   Flat foot 11/28/2017    Tendonitis of foot 11/28/2017   Polycythemia 05/12/2017   Vitamin D deficiency 04/21/2017   Bony sclerosis 08/05/2016   Lung nodule    Chronic pain of both knees 12/05/2015   Gastroesophageal reflux disease without esophagitis 05/17/2014   Essential hypertension 05/17/2014   Coronary artery calcification 08/17/2012   Abnormal LFTs (liver function tests) 07/11/2012   Chronic abdominal pain 06/24/2012   Right-sided chest wall pain 06/24/2012   Constipation, chronic 06/24/2012   Hyperbilirubinemia 06/24/2012   Chronic back pain 06/24/2012   DDD (degenerative disc disease), thoracolumbar 06/24/2012   History of nephrolithiasis 06/24/2012   Side pain 06/24/2012   Low back pain at multiple sites 06/24/2012    PCP: [name], MD  REFERRING PROVIDER: Kathryne Hitch, MD  REFERRING DIAG: M17.0 (ICD-10-CM) - Bilateral primary osteoarthritis of knee   THERAPY DIAG:  Chronic pain of right knee  Chronic pain of left knee  Muscle weakness (generalized)  Other abnormalities of gait and mobility  Rationale for Evaluation and Treatment: Rehabilitation  ONSET DATE: Chronic  SUBJECTIVE:   SUBJECTIVE STATEMENT: Pt presents to PT with reports of chronic bilateral knee pain and discomfort. Has PMH involving CVA with residual R sided weakness. Pain with sit>stand and difficulty with all bed mobility and functional mobility secondary to LE weakness, R>L. Ambulates with hemi-walker in L hand, has very limited L UE use.   PERTINENT HISTORY: CVA (R Sided Weakness), HTN  PAIN:  Are you having pain?  Yes: NPRS scale: 8/10 Worst: 10/10 Pain location: bilateral knees (lateral L knee, posterior R knee) Pain description: sharp Aggravating factors: transfers, ambulating Relieving factors: none  PRECAUTIONS: None  WEIGHT BEARING RESTRICTIONS: No  FALLS:  Has patient fallen in last 6 months? No  LIVING ENVIRONMENT: Lives with: lives with their family Lives in:  House/apartment Stairs: No Has following equipment at home: Hemi walker  OCCUPATION: Retired  PLOF: Independent with household mobility with device  PATIENT GOALS: decrease knee pain, improve safety with walking  OBJECTIVE:   DIAGNOSTIC FINDINGS:   See imaging   PATIENT SURVEYS:  FOTO: 39% function; 55% predicted  COGNITION: Overall cognitive status: Within functional limits for tasks assessed     SENSATION: Light touch: Impaired - Left extremities   POSTURE: rounded shoulders, forward head, flexed trunk , and R arm adducted and internally rotated   PALPATION: TTP to R posterior hamstring tendon, palpable mass noted in posterior medial knee (most likely hamstring tendon, but informed pt to ask MD)  LOWER EXTREMITY ROM:  Active ROM Right eval Left eval  Hip flexion    Hip extension    Hip abduction    Hip adduction    Hip internal rotation    Hip external rotation    Knee flexion 115 120  Knee extension 9 0  Ankle dorsiflexion    Ankle plantarflexion    Ankle inversion    Ankle eversion     (Blank rows = not tested)  LOWER EXTREMITY MMT:  MMT Right eval Left eval  Hip flexion 2+/5 4/5  Hip extension    Hip abduction 2+/5 4/5  Hip adduction    Hip internal rotation    Hip external rotation    Knee flexion 3/5 4/5  Knee extension 2+/5 4/5  Ankle dorsiflexion    Ankle plantarflexion    Ankle inversion    Ankle eversion     (Blank rows = not tested)  LOWER EXTREMITY SPECIAL TESTS:  DNT  FUNCTIONAL TESTS:  30 Second Sit to Stand: 1 rep - increased pain; attempted two more times but had LoB  GAIT: Distance walked: 54ft Assistive device utilized: Hemi walker Level of assistance: Min A Comments: decreased heel strike, decreased gait speed and step length  TREATMENT: [facility] Adult PT Treatment:                                                DATE: 06/23/2022 Therapeutic Exercise: LAQ x 5  Seated hamstring stretch x 30" L Supine quad sets x 5 -  5" hold Seated hip abd x 10 RTB  PATIENT EDUCATION:  Education details: eval findings, FOTO, HEP, POC Person educated: Patient Education method: Explanation, Demonstration, and Handouts Education comprehension: verbalized understanding and returned demonstration  HOME EXERCISE PROGRAM: Access Code: [email or web address] URL: [email or web address] Date: 06/23/2022 Prepared by: Edwinna Areola  Exercises - Seated Long Arc Quad  - 1 x daily - 7 x weekly - 2 sets - 10 reps - Seated Hamstring Stretch  - 1 x daily - 7 x weekly - 2-3 reps - 30 seconds hold - Supine Quadricep Sets  - 1 x daily - 7 x weekly - 2 sets - 10 reps - 5 sec hold - Seated Hip Abduction with Resistance  - 1 x daily - 7 x weekly - 3 sets -  10 reps - red band hold  ASSESSMENT:  CLINICAL IMPRESSION: Patient is a 67 y.o. M who was seen today for physical therapy evaluation and treatment for chronic bilateral knee pain, R>L. Physical findings are consistent with MD impression and complicated PMH. Pt has R residual weakness s/p CVA and has difficulty with functional mobility and gait.  His FOTO score shows drastic decrease in subjective functional ability below PLOF. Pt would benefit from trial of skilled PT services working on attempting to strengthen LE for decreasing knee pain and improve safety with gait.  OBJECTIVE IMPAIRMENTS: Abnormal gait, decreased activity tolerance, decreased mobility, difficulty walking, decreased ROM, decreased strength, and pain.   ACTIVITY LIMITATIONS: carrying, lifting, bending, standing, squatting, stairs, transfers, reach over head, and locomotion level  PARTICIPATION LIMITATIONS: meal prep, cleaning, laundry, driving, shopping, community activity, and yard work  PERSONAL FACTORS: Time since onset of injury/illness/exacerbation and 3+ comorbidities: CVA (R Sided Weakness), HTN  are also affecting patient's functional outcome.   REHAB POTENTIAL: Fair - Guarded secondary to chronicity of R  sided weakness  CLINICAL DECISION MAKING: Evolving/moderate complexity  EVALUATION COMPLEXITY: Moderate   GOALS: Goals reviewed with patient? No  SHORT TERM GOALS: Target date: 07/14/2022   Pt will be compliant and knowledgeable with initial HEP for improved comfort and carryover Baseline: initial HEP given  Goal status: INITIAL  2.  Pt will self report bilateral knee pain no greater than 6/10 for improved comfort and functional ability Baseline: 10/10 at worst Goal status: INITIAL   LONG TERM GOALS: Target date: 08/18/2022   Pt will improve FOTO function score to no less than 55% as proxy for functional improvement Baseline: 39% function Goal status: INITIAL   2.  Pt will self report bilateral knee pain no greater than 3/10 for improved comfort and functional ability Baseline: 10/10 at worst Goal status: INITIAL   3.  Pt will increase 30 Second Sit to Stand rep count to no less than 3 reps for improved balance, strength, and functional mobility Baseline: 1 reps  Goal status: INITIAL   4.  Pt will improve R knee AROM to range of 3-115 degrees for improved comfort and functional mobility Baseline: see ROM chart Goal status: INITIAL   PLAN:  PT FREQUENCY: 1-2x/week  PT DURATION: 8 weeks  PLANNED INTERVENTIONS: Therapeutic exercises, Therapeutic activity, Neuromuscular re-education, Balance training, Gait training, Patient/Family education, Self Care, and Joint mobilization  PLAN FOR NEXT SESSION: assess HEP response, LE strengthening, gait   Eloy End, PT 06/23/2022, 1:34 PM

## 2022-06-23 NOTE — Progress Notes (Unsigned)
Subjective:   Aaron Dennis is a 67 y.o. male who presents for an Initial Medicare Annual Wellness Visit.  Review of Systems    ***       Objective:    There were no vitals filed for this visit. There is no height or weight on file to calculate BMI.     06/23/2022   10:41 AM 10/23/2021    7:36 AM 10/22/2021    1:08 PM 09/02/2021    5:17 PM 08/13/2021    9:44 AM 08/04/2021   11:09 AM 07/14/2021    8:34 AM  Advanced Directives  Does Patient Have a Medical Advance Directive? No No No No No No No  Would patient like information on creating a medical advance directive?  No - Patient declined    Yes (MAU/Ambulatory/Procedural Areas - Information given) No - Patient declined    Current Medications (verified) Outpatient Encounter Medications as of 06/24/2022  Medication Sig   acetaminophen (TYLENOL) 325 MG tablet Take 1-2 tablets (325-650 mg total) by mouth every 4 (four) hours as needed for mild pain.   amLODipine (NORVASC) 5 MG tablet Take 1 tablet (5 mg total) by mouth daily.   aspirin 81 MG EC tablet Take 1 tablet (81 mg total) by mouth daily. Swallow whole.   baclofen (LIORESAL) 10 MG tablet Take 1 tablet (10 mg total) by mouth 3 (three) times daily.   citalopram (CELEXA) 20 MG tablet Take 1 tablet (20 mg total) by mouth daily.   HYDROcodone-acetaminophen (NORCO) 5-325 MG tablet Take 1 tablet by mouth every 6 (six) hours as needed for moderate pain.   omeprazole (PRILOSEC) 40 MG capsule Take 1 capsule (40 mg total) by mouth daily.   polyethylene glycol powder (GLYCOLAX/MIRALAX) 17 GM/SCOOP powder Take 17 grams by mouth daily. (Patient taking differently: Take 17 g by mouth daily as needed for moderate constipation.)   rosuvastatin (CRESTOR) 20 MG tablet Take 1 tablet (20 mg total) by mouth daily.   tamsulosin (FLOMAX) 0.4 MG CAPS capsule Take 2 capsules (0.8 mg total) by mouth daily.   vitamin B-12 (CYANOCOBALAMIN) 1000 MCG tablet Take 1 tablet (1,000 mcg total) by mouth daily.   No  facility-administered encounter medications on file as of 06/24/2022.    Allergies (verified) Beef-derived products, Fish-derived products, and Pork-derived products   History: Past Medical History:  Diagnosis Date   Constipation    Depression    GERD (gastroesophageal reflux disease)    Hypertension    Lung nodule    7 mm LUL nodule   Polycythemia    Stroke (HCC) 05/01/2021   acute left frontoparietal infarct, residual right-sided hemiparesis   Vertebral artery aneurysm (HCC)    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   Vitamin D deficiency    Past Surgical History:  Procedure Laterality Date   IR RADIOLOGIST EVAL & MGMT  07/02/2021   IR RADIOLOGIST EVAL & MGMT  07/22/2021   LIPOMA EXCISION     SHOULDER ARTHROSCOPY WITH SUBACROMIAL DECOMPRESSION Right 10/23/2021   Procedure: RIGHT SHOULDER ARTHROSCOPY WITH DEBRIDEMENT, SUBACROMIAL DECOMPRESSION, MANIPULATION UNDER ANESTHESIA;  Surgeon: Kathryne Hitch, MD;  Location: [facility];  Service: Orthopedics;  Laterality: Right;   Family History  Problem Relation Age of Onset   Healthy Mother    Diabetes Brother    Colon polyps Neg Hx    Colon cancer Neg Hx    Esophageal cancer Neg Hx    Rectal cancer Neg Hx    Stomach cancer Neg  Hx    Social History   Socioeconomic History   Marital status: Married    Spouse name: Not on file   Number of children: Not on file   Years of education: Not on file   Highest education level: Not on file  Occupational History   Not on file  Tobacco Use   Smoking status: Former   Smokeless tobacco: Never   Tobacco comments:    quit Jan 2018  Vaping Use   Vaping Use: Never used  Substance and Sexual Activity   Alcohol use: No   Drug use: No   Sexual activity: Not Currently  Other Topics Concern   Not on file  Social History Narrative   Not on file   Social Determinants of Health   Financial Resource Strain: Not on file  Food Insecurity: Not on file  Transportation  Needs: Not on file  Physical Activity: Not on file  Stress: Not on file  Social Connections: Not on file    Tobacco Counseling Counseling given: Not Answered Tobacco comments: quit Jan 2018   Clinical Intake:                 Diabetic?No          Activities of Daily Living    10/22/2021    1:06 PM 07/14/2021    8:25 AM  In your present state of health, do you have any difficulty performing the following activities:  Hearing? 0 0  Vision? 0 0  Difficulty concentrating or making decisions? 0 0  Walking or climbing stairs? 1 1  Dressing or bathing? 0 1  Doing errands, shopping? 0     Patient Care Team: [name], MD as PCP - General (Family Medicine)  Indicate any recent Medical Services you may have received from other than [facility] providers in the past year (date may be approximate).     Assessment:   This is a routine wellness examination for [facility].  Hearing/Vision screen No results found.  Dietary issues and exercise activities discussed:     Goals Addressed   None    Depression Screen    06/11/2022    8:50 AM 10/07/2021    9:30 AM 08/13/2021    9:45 AM 06/30/2021   11:26 AM 06/11/2021   10:21 AM 08/14/2020    2:48 PM 06/25/2020   10:17 AM  PHQ 2/9 Scores  PHQ - 2 Score 1 3 0 3 2 0 1  PHQ- 9 Score 9 7  16 7 3 1     Fall Risk    06/11/2022    8:50 AM 10/07/2021    9:20 AM 08/13/2021    9:44 AM 06/30/2021   11:06 AM 06/11/2021   10:17 AM  Fall Risk   Falls in the past year? 0 0 1 1 0  Number falls in past yr: 0 0 1 0 0  Injury with Fall? 0 0 0 0 0    FALL RISK PREVENTION PERTAINING TO THE HOME:  Any stairs in or around the home? {YES/NO:21197} If so, are there any without handrails? {YES/NO:21197} Home free of loose throw rugs in walkways, pet beds, electrical cords, etc? {YES/NO:21197} Adequate lighting in your home to reduce risk of falls? {YES/NO:21197}  ASSISTIVE DEVICES UTILIZED TO PREVENT FALLS:  Life alert? {YES/NO:21197} Use of a  cane, walker or w/c? {YES/NO:21197} Grab bars in the bathroom? {YES/NO:21197} Shower chair or bench in shower? {YES/NO:21197} Elevated toilet seat or a handicapped toilet? {YES/NO:21197}  TIMED UP  AND GO:  Was the test performed? No . Telephonic visit   Cognitive Function:        Immunizations Immunization History  Administered Date(s) Administered   Meningococcal Conjugate 09/29/2017    {TDAP status:2101805}  {Pneumococcal vaccine status:2101807}  Covid-19 vaccine status: Information provided on how to obtain vaccines.   Qualifies for Shingles Vaccine? Yes   Zostavax completed No   Shingrix Completed?: No.    Education has been provided regarding the importance of this vaccine. Patient has been advised to call insurance company to determine out of pocket expense if they have not yet received this vaccine. Advised may also receive vaccine at local pharmacy or Health Dept. Verbalized acceptance and understanding.  Screening Tests Health Maintenance  Topic Date Due   Medicare Annual Wellness (AWV)  Never done   COVID-19 Vaccine (1) Never done   DTaP/Tdap/Td (1 - Tdap) Never done   Zoster Vaccines- Shingrix (1 of 2) Never done   Pneumonia Vaccine 96+ Years old (1 of 1 - PCV) 10/08/2022 (Originally 09/09/2020)   INFLUENZA VACCINE  09/10/2022   COLONOSCOPY (Pts 45-11yrs Insurance coverage will need to be confirmed)  05/07/2027   Hepatitis C Screening  Completed   HPV VACCINES  Aged Out    Health Maintenance  Health Maintenance Due  Topic Date Due   Medicare Annual Wellness (AWV)  Never done   COVID-19 Vaccine (1) Never done   DTaP/Tdap/Td (1 - Tdap) Never done   Zoster Vaccines- Shingrix (1 of 2) Never done    Colorectal cancer screening: Type of screening: Colonoscopy. Completed 05/06/17. Repeat every 10 years  Lung Cancer Screening: (Low Dose CT Chest recommended if Age 54-80 years, 30 pack-year currently smoking OR have quit w/in 15years.) does not qualify.   Lung  Cancer Screening Referral: n/a  Additional Screening:  Hepatitis C Screening: does qualify; Completed 11/29/18  Vision Screening: Recommended annual ophthalmology exams for early detection of glaucoma and other disorders of the eye. Is the patient up to date with their annual eye exam?  {YES/NO:21197} Who is the provider or what is the name of the office in which the patient attends annual eye exams? *** If pt is not established with a provider, would they like to be referred to a provider to establish care? {YES/NO:21197}.   Dental Screening: Recommended annual dental exams for proper oral hygiene  Community Resource Referral / Chronic Care Management: CRR required this visit?  {YES/NO:21197}  CCM required this visit?  {YES/NO:21197}     Plan:     I have personally reviewed and noted the following in the patient's chart:   Medical and social history Use of alcohol, tobacco or illicit drugs  Current medications and supplements including opioid prescriptions. {Opioid [phone]} Functional ability and status Nutritional status Physical activity Advanced directives List of other physicians Hospitalizations, surgeries, and ER visits in previous 12 months Vitals Screenings to include cognitive, depression, and falls Referrals and appointments  In addition, I have reviewed and discussed with patient certain preventive protocols, quality metrics, and best practice recommendations. A written personalized care plan for preventive services as well as general preventive health recommendations were provided to patient.     Durwin Nora, [place]   1/61/0960   Due to this being a virtual visit, the after visit summary with patients personalized plan was offered to patient via mail or my-chart. ***Patient declined at this time./ Patient would like to access on my-chart/ per request, patient was mailed a copy of AVS./ Patient  preferred to pick up at office at next  visit  Nurse Notes: ***

## 2022-06-23 NOTE — Patient Instructions (Signed)
Aaron Dennis , Thank you for taking time to come for your Medicare Wellness Visit. I appreciate your ongoing commitment to your health goals. Please review the following plan we discussed and let me know if I can assist you in the future.   These are the goals we discussed:  Goals   None     This is a list of the screening recommended for you and due dates:  Health Maintenance  Topic Date Due   Medicare Annual Wellness Visit  Never done   COVID-19 Vaccine (1) Never done   DTaP/Tdap/Td vaccine (1 - Tdap) Never done   Zoster (Shingles) Vaccine (1 of 2) Never done   Pneumonia Vaccine (1 of 1 - PCV) 10/08/2022*   Flu Shot  09/10/2022   Colon Cancer Screening  05/07/2027   Hepatitis C Screening: USPSTF Recommendation to screen - Ages 18-79 yo.  Completed   HPV Vaccine  Aged Out  *Topic was postponed. The date shown is not the original due date.    Advanced directives: ***  Conditions/risks identified: Aim for 30 minutes of exercise or brisk walking, 6-8 glasses of water, and 5 servings of fruits and vegetables each day.   Next appointment: Follow up in one year for your annual wellness visit.   Preventive Care 88 Years and Older, Male  Preventive care refers to lifestyle choices and visits with your health care provider that can promote health and wellness. What does preventive care include? A yearly physical exam. This is also called an annual well check. Dental exams once or twice a year. Routine eye exams. Ask your health care provider how often you should have your eyes checked. Personal lifestyle choices, including: Daily care of your teeth and gums. Regular physical activity. Eating a healthy diet. Avoiding tobacco and drug use. Limiting alcohol use. Practicing safe sex. Taking low doses of aspirin every day. Taking vitamin and mineral supplements as recommended by your health care provider. What happens during an annual well check? The services and screenings done by your  health care provider during your annual well check will depend on your age, overall health, lifestyle risk factors, and family history of disease. Counseling  Your health care provider may ask you questions about your: Alcohol use. Tobacco use. Drug use. Emotional well-being. Home and relationship well-being. Sexual activity. Eating habits. History of falls. Memory and ability to understand (cognition). Work and work Astronomer. Screening  You may have the following tests or measurements: Height, weight, and BMI. Blood pressure. Lipid and cholesterol levels. These may be checked every 5 years, or more frequently if you are over 62 years old. Skin check. Lung cancer screening. You may have this screening every year starting at age 77 if you have a 30-pack-year history of smoking and currently smoke or have quit within the past 15 years. Fecal occult blood test (FOBT) of the stool. You may have this test every year starting at age 22. Flexible sigmoidoscopy or colonoscopy. You may have a sigmoidoscopy every 5 years or a colonoscopy every 10 years starting at age 55. Prostate cancer screening. Recommendations will vary depending on your family history and other risks. Hepatitis C blood test. Hepatitis B blood test. Sexually transmitted disease (STD) testing. Diabetes screening. This is done by checking your blood sugar (glucose) after you have not eaten for a while (fasting). You may have this done every 1-3 years. Abdominal aortic aneurysm (AAA) screening. You may need this if you are a current or former smoker. Osteoporosis.  You may be screened starting at age 47 if you are at high risk. Talk with your health care provider about your test results, treatment options, and if necessary, the need for more tests. Vaccines  Your health care provider may recommend certain vaccines, such as: Influenza vaccine. This is recommended every year. Tetanus, diphtheria, and acellular pertussis  (Tdap, Td) vaccine. You may need a Td booster every 10 years. Zoster vaccine. You may need this after age 25. Pneumococcal 13-valent conjugate (PCV13) vaccine. One dose is recommended after age 41. Pneumococcal polysaccharide (PPSV23) vaccine. One dose is recommended after age 28. Talk to your health care provider about which screenings and vaccines you need and how often you need them. This information is not intended to replace advice given to you by your health care provider. Make sure you discuss any questions you have with your health care provider. Document Released: 02/22/2015 Document Revised: 10/16/2015 Document Reviewed: 11/27/2014 Elsevier Interactive Patient Education  2017 [employer] Prevention in the Home Falls can cause injuries. They can happen to people of all ages. There are many things you can do to make your home safe and to help prevent falls. What can I do on the outside of my home? Regularly fix the edges of walkways and driveways and fix any cracks. Remove anything that might make you trip as you walk through a door, such as a raised step or threshold. Trim any bushes or trees on the path to your home. Use bright outdoor lighting. Clear any walking paths of anything that might make someone trip, such as rocks or tools. Regularly check to see if handrails are loose or broken. Make sure that both sides of any steps have handrails. Any raised decks and porches should have guardrails on the edges. Have any leaves, snow, or ice cleared regularly. Use sand or salt on walking paths during winter. Clean up any spills in your garage right away. This includes oil or grease spills. What can I do in the bathroom? Use night lights. Install grab bars by the toilet and in the tub and shower. Do not use towel bars as grab bars. Use non-skid mats or decals in the tub or shower. If you need to sit down in the shower, use a plastic, non-slip stool. Keep the floor dry. Clean  up any water that spills on the floor as soon as it happens. Remove soap buildup in the tub or shower regularly. Attach bath mats securely with double-sided non-slip rug tape. Do not have throw rugs and other things on the floor that can make you trip. What can I do in the bedroom? Use night lights. Make sure that you have a light by your bed that is easy to reach. Do not use any sheets or blankets that are too big for your bed. They should not hang down onto the floor. Have a firm chair that has side arms. You can use this for support while you get dressed. Do not have throw rugs and other things on the floor that can make you trip. What can I do in the kitchen? Clean up any spills right away. Avoid walking on wet floors. Keep items that you use a lot in easy-to-reach places. If you need to reach something above you, use a strong step stool that has a grab bar. Keep electrical cords out of the way. Do not use floor polish or wax that makes floors slippery. If you must use wax, use non-skid floor wax.  Do not have throw rugs and other things on the floor that can make you trip. What can I do with my stairs? Do not leave any items on the stairs. Make sure that there are handrails on both sides of the stairs and use them. Fix handrails that are broken or loose. Make sure that handrails are as long as the stairways. Check any carpeting to make sure that it is firmly attached to the stairs. Fix any carpet that is loose or worn. Avoid having throw rugs at the top or bottom of the stairs. If you do have throw rugs, attach them to the floor with carpet tape. Make sure that you have a light switch at the top of the stairs and the bottom of the stairs. If you do not have them, ask someone to add them for you. What else can I do to help prevent falls? Wear shoes that: Do not have high heels. Have rubber bottoms. Are comfortable and fit you well. Are closed at the toe. Do not wear sandals. If you  use a stepladder: Make sure that it is fully opened. Do not climb a closed stepladder. Make sure that both sides of the stepladder are locked into place. Ask someone to hold it for you, if possible. Clearly mark and make sure that you can see: Any grab bars or handrails. First and last steps. Where the edge of each step is. Use tools that help you move around (mobility aids) if they are needed. These include: Canes. Walkers. Scooters. Crutches. Turn on the lights when you go into a dark area. Replace any light bulbs as soon as they burn out. Set up your furniture so you have a clear path. Avoid moving your furniture around. If any of your floors are uneven, fix them. If there are any pets around you, be aware of where they are. Review your medicines with your doctor. Some medicines can make you feel dizzy. This can increase your chance of falling. Ask your doctor what other things that you can do to help prevent falls. This information is not intended to replace advice given to you by your health care provider. Make sure you discuss any questions you have with your health care provider. Document Released: 11/22/2008 Document Revised: 07/04/2015 Document Reviewed: 03/02/2014 Elsevier Interactive Patient Education  2017 [employer].

## 2022-06-24 ENCOUNTER — Ambulatory Visit: Payer: 59 | Attending: Family Medicine

## 2022-06-24 VITALS — Ht 70.0 in | Wt 166.0 lb

## 2022-06-24 DIAGNOSIS — Z0000 Encounter for general adult medical examination without abnormal findings: Secondary | ICD-10-CM

## 2022-06-29 ENCOUNTER — Telehealth: Payer: Self-pay | Admitting: Family Medicine

## 2022-06-29 ENCOUNTER — Ambulatory Visit: Payer: 59

## 2022-06-29 DIAGNOSIS — M25561 Pain in right knee: Secondary | ICD-10-CM | POA: Diagnosis not present

## 2022-06-29 DIAGNOSIS — M25562 Pain in left knee: Secondary | ICD-10-CM | POA: Diagnosis not present

## 2022-06-29 DIAGNOSIS — R2689 Other abnormalities of gait and mobility: Secondary | ICD-10-CM | POA: Diagnosis not present

## 2022-06-29 DIAGNOSIS — M170 Bilateral primary osteoarthritis of knee: Secondary | ICD-10-CM | POA: Diagnosis not present

## 2022-06-29 DIAGNOSIS — M6281 Muscle weakness (generalized): Secondary | ICD-10-CM | POA: Diagnosis not present

## 2022-06-29 DIAGNOSIS — G8929 Other chronic pain: Secondary | ICD-10-CM | POA: Diagnosis not present

## 2022-06-29 NOTE — Telephone Encounter (Signed)
Called pt. His brother, Kandace Parkins (on [place]) picked up. States he is now in PT. He will have him call office back.

## 2022-06-29 NOTE — Therapy (Signed)
OUTPATIENT PHYSICAL THERAPY TREATMENT   Patient Name: Aaron Dennis MRN: [id number] DOB:1955/11/13, 67 y.o., male Today's Date: 06/29/2022  END OF SESSION:  PT End of Session - 06/29/22 0913     Visit Number 2    Number of Visits 17    Date for PT Re-Evaluation 08/18/22    Authorization Type [employer] Medicare    PT Start Time 0915    PT Stop Time 0957    PT Time Calculation (min) 42 min    Activity Tolerance Patient tolerated treatment well    Behavior During Therapy WFL for tasks assessed/performed              Past Medical History:  Diagnosis Date   Constipation    Depression    GERD (gastroesophageal reflux disease)    Hypertension    Lung nodule    7 mm LUL nodule   Polycythemia    Stroke (HCC) 05/01/2021   acute left frontoparietal infarct, residual right-sided hemiparesis   Vertebral artery aneurysm (HCC)    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   Vitamin D deficiency    Past Surgical History:  Procedure Laterality Date   IR RADIOLOGIST EVAL & MGMT  07/02/2021   IR RADIOLOGIST EVAL & MGMT  07/22/2021   LIPOMA EXCISION     SHOULDER ARTHROSCOPY WITH SUBACROMIAL DECOMPRESSION Right 10/23/2021   Procedure: RIGHT SHOULDER ARTHROSCOPY WITH DEBRIDEMENT, SUBACROMIAL DECOMPRESSION, MANIPULATION UNDER ANESTHESIA;  Surgeon: Kathryne Hitch, MD;  Location: [facility];  Service: Orthopedics;  Laterality: Right;   Patient Active Problem List   Diagnosis Date Noted   Benign prostatic hyperplasia with nocturia 06/11/2022   Arthrofibrosis of right shoulder 10/23/2021   Aneurysm, cerebral, nonruptured 08/13/2021   Status post stroke due to cerebrovascular disease    Cerebral edema (HCC) 04/29/2021   Stroke (cerebrum) (HCC) 04/29/2021   Right sided weakness    Acute CVA (cerebrovascular accident) (HCC) 04/27/2021   Primary osteoarthritis of left knee 01/13/2019   Primary osteoarthritis of right knee 01/13/2019   Flat foot 11/28/2017   Tendonitis of foot  11/28/2017   Polycythemia 05/12/2017   Vitamin D deficiency 04/21/2017   Bony sclerosis 08/05/2016   Lung nodule    Chronic pain of both knees 12/05/2015   Gastroesophageal reflux disease without esophagitis 05/17/2014   Essential hypertension 05/17/2014   Coronary artery calcification 08/17/2012   Abnormal LFTs (liver function tests) 07/11/2012   Chronic abdominal pain 06/24/2012   Right-sided chest wall pain 06/24/2012   Constipation, chronic 06/24/2012   Hyperbilirubinemia 06/24/2012   Chronic back pain 06/24/2012   DDD (degenerative disc disease), thoracolumbar 06/24/2012   History of nephrolithiasis 06/24/2012   Side pain 06/24/2012   Low back pain at multiple sites 06/24/2012    PCP: [name], MD  REFERRING PROVIDER: Kathryne Hitch, MD  REFERRING DIAG: M17.0 (ICD-10-CM) - Bilateral primary osteoarthritis of knee   THERAPY DIAG:  Chronic pain of right knee  Chronic pain of left knee  Muscle weakness (generalized)  Rationale for Evaluation and Treatment: Rehabilitation  ONSET DATE: Chronic  SUBJECTIVE:   SUBJECTIVE STATEMENT: Pt presents to PT with continued bilateral knee pain. Initial HEP going fair at home, no adverse effect.   PERTINENT HISTORY: CVA (R Sided Weakness), HTN  PAIN:  Are you having pain?  Yes: NPRS scale: 8/10 Worst: 10/10 Pain location: bilateral knees (lateral L knee, posterior R knee) Pain description: sharp Aggravating factors: transfers, ambulating Relieving factors: none  PRECAUTIONS: None  WEIGHT BEARING RESTRICTIONS:  No  OBJECTIVE:   VITALS:   BP: 138/65  PATIENT SURVEYS:  FOTO: 39% function; 55% predicted  LOWER EXTREMITY ROM:  Active ROM Right eval Left eval  Hip flexion    Hip extension    Hip abduction    Hip adduction    Hip internal rotation    Hip external rotation    Knee flexion 115 120  Knee extension 9 0  Ankle dorsiflexion    Ankle plantarflexion    Ankle inversion    Ankle  eversion     (Blank rows = not tested)  LOWER EXTREMITY MMT:  MMT Right eval Left eval  Hip flexion 2+/5 4/5  Hip extension    Hip abduction 2+/5 4/5  Hip adduction    Hip internal rotation    Hip external rotation    Knee flexion 3/5 4/5  Knee extension 2+/5 4/5  Ankle dorsiflexion    Ankle plantarflexion    Ankle inversion    Ankle eversion     (Blank rows = not tested)  FUNCTIONAL TESTS:  30 Second Sit to Stand: 1 rep - increased pain; attempted two more times but had LoB (on eval)  TREATMENT: [facility] Adult PT Treatment:                                                DATE: 06/29/2022 Therapeutic Exercise: NuStep L3 LE only x 5 min LAQ 2x10 Seated ball squeeze 2x10 - 5" hold LAQ with add (ball) 2x10 Seated clamshell 2x15 RTB Seated march 2x20 RTB Supine quad set into towel 2x10 - 5" hold each Manual Therapy: Manual stretching to bliateral hamstrings PA and AP mobs to R knee in flexed and extended positions Grade II  [facility] Adult PT Treatment:                                                DATE: 06/23/2022 Therapeutic Exercise: LAQ x 5  Seated hamstring stretch x 30" L Supine quad sets x 5 - 5" hold Seated hip abd x 10 RTB  PATIENT EDUCATION:  Education details: continue HEP Person educated: Patient Education method:  N/A Education comprehension:  N/A  HOME EXERCISE PROGRAM: Access Code: [email or web address] URL: [email or web address] Date: 06/23/2022 Prepared by: Edwinna Areola  Exercises - Seated Long Arc Quad  - 1 x daily - 7 x weekly - 2 sets - 10 reps - Seated Hamstring Stretch  - 1 x daily - 7 x weekly - 2-3 reps - 30 seconds hold - Supine Quadricep Sets  - 1 x daily - 7 x weekly - 2 sets - 10 reps - 5 sec hold - Seated Hip Abduction with Resistance  - 1 x daily - 7 x weekly - 3 sets - 10 reps - red band hold  ASSESSMENT:  CLINICAL IMPRESSION: Pt tolerated treatment fair but was limited by R sided weakness and severe knee pain. Therapy focused on  quad strengthening and improving knee ROM. Will continue to progress as able per POC.   OBJECTIVE IMPAIRMENTS: Abnormal gait, decreased activity tolerance, decreased mobility, difficulty walking, decreased ROM, decreased strength, and pain.   ACTIVITY LIMITATIONS: carrying, lifting, bending, standing, squatting, stairs, transfers, reach over head, and locomotion level  PARTICIPATION LIMITATIONS:  meal prep, cleaning, laundry, driving, shopping, community activity, and yard work  PERSONAL FACTORS: Time since onset of injury/illness/exacerbation and 3+ comorbidities: CVA (R Sided Weakness), HTN  are also affecting patient's functional outcome.    GOALS: Goals reviewed with patient? No  SHORT TERM GOALS: Target date: 07/14/2022   Pt will be compliant and knowledgeable with initial HEP for improved comfort and carryover Baseline: initial HEP given  Goal status: INITIAL  2.  Pt will self report bilateral knee pain no greater than 6/10 for improved comfort and functional ability Baseline: 10/10 at worst Goal status: INITIAL   LONG TERM GOALS: Target date: 08/18/2022   Pt will improve FOTO function score to no less than 55% as proxy for functional improvement Baseline: 39% function Goal status: INITIAL   2.  Pt will self report bilateral knee pain no greater than 3/10 for improved comfort and functional ability Baseline: 10/10 at worst Goal status: INITIAL   3.  Pt will increase 30 Second Sit to Stand rep count to no less than 3 reps for improved balance, strength, and functional mobility Baseline: 1 reps  Goal status: INITIAL   4.  Pt will improve R knee AROM to range of 3-115 degrees for improved comfort and functional mobility Baseline: see ROM chart Goal status: INITIAL   PLAN:  PT FREQUENCY: 1-2x/week  PT DURATION: 8 weeks  PLANNED INTERVENTIONS: Therapeutic exercises, Therapeutic activity, Neuromuscular re-education, Balance training, Gait training, Patient/Family  education, Self Care, and Joint mobilization  PLAN FOR NEXT SESSION: assess HEP response, LE strengthening, gait   Eloy End, PT 06/29/2022, 9:59 AM

## 2022-06-29 NOTE — Telephone Encounter (Signed)
Pt called. Stated he wants to make a follow-up appointment with a doctor and not a NP. Pt has only seen NP and not a provider.

## 2022-06-29 NOTE — Telephone Encounter (Signed)
This is for stroke, please place on Dr. Ronnald Ramp schedule thank you (not sure who would do it out of you all, sorry to spam everyone but this stroke needs to go to [place] thanks)

## 2022-06-29 NOTE — Telephone Encounter (Signed)
Pt called stating that he would like a follow up appointment with a Doctor here, he has been only seen by Shawnie Dapper, NP and never a Doctor here since 07/02/2021. Told pt that we will let one on the Doctors on call to see if one of them can see him. Pt verbalized understanding.

## 2022-06-30 NOTE — Telephone Encounter (Signed)
Called pt back. Scheduled him with Dr. Pearlean Brownie on 8/9 @ 10 am. Pt said thank you so much for calling me back.

## 2022-06-30 NOTE — Telephone Encounter (Signed)
Noted  

## 2022-06-30 NOTE — Telephone Encounter (Addendum)
Slot on hold, per [place] to use. 8/19 10am

## 2022-07-01 ENCOUNTER — Ambulatory Visit (INDEPENDENT_AMBULATORY_CARE_PROVIDER_SITE_OTHER): Payer: 59 | Admitting: Orthopaedic Surgery

## 2022-07-01 DIAGNOSIS — Z9889 Other specified postprocedural states: Secondary | ICD-10-CM | POA: Diagnosis not present

## 2022-07-01 NOTE — Progress Notes (Signed)
Chief Complaint: Pain     History of Present Illness:    Aaron Dennis is a 67 y.o. male presents with right shoulder pain as a referral from my partner Dr. Magnus Ivan for ongoing shoulder pain and weakness.  Of note he did have a stroke several years prior which has resulted in significant shoulder pain and limited range of motion about the entire right upper extremity.  He did undergo manipulation under anesthesia seizure with arthroscopy with Dr. Magnus Ivan 1 year prior.  He states that this time he does have some pain that does shoot down the arm its entirety but his biggest concern is limited range of motion.  He is here today with his brother who helps to take care of him    Surgical History:   As above  PMH/PSH/Family History/Social History/Meds/Allergies:    Past Medical History:  Diagnosis Date   Constipation    Depression    GERD (gastroesophageal reflux disease)    Hypertension    Lung nodule    7 mm LUL nodule   Polycythemia    Stroke (HCC) 05/01/2021   acute left frontoparietal infarct, residual right-sided hemiparesis   Vertebral artery aneurysm (HCC)    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   Vitamin D deficiency    Past Surgical History:  Procedure Laterality Date   IR RADIOLOGIST EVAL & MGMT  07/02/2021   IR RADIOLOGIST EVAL & MGMT  07/22/2021   LIPOMA EXCISION     SHOULDER ARTHROSCOPY WITH SUBACROMIAL DECOMPRESSION Right 10/23/2021   Procedure: RIGHT SHOULDER ARTHROSCOPY WITH DEBRIDEMENT, SUBACROMIAL DECOMPRESSION, MANIPULATION UNDER ANESTHESIA;  Surgeon: Kathryne Hitch, MD;  Location: [facility];  Service: Orthopedics;  Laterality: Right;   Social History   Socioeconomic History   Marital status: Married    Spouse name: Not on file   Number of children: Not on file   Years of education: Not on file   Highest education level: Not on file  Occupational History   Not on file  Tobacco Use   Smoking  status: Former   Smokeless tobacco: Never   Tobacco comments:    quit Jan 2018  Vaping Use   Vaping Use: Never used  Substance and Sexual Activity   Alcohol use: No   Drug use: No   Sexual activity: Not Currently  Other Topics Concern   Not on file  Social History Narrative   Not on file   Social Determinants of Health   Financial Resource Strain: Low Risk  (06/24/2022)   Overall Financial Resource Strain (CARDIA)    Difficulty of Paying Living Expenses: Not hard at all  Food Insecurity: No Food Insecurity (06/24/2022)   Hunger Vital Sign    Worried About Running Out of Food in the Last Year: Never true    Ran Out of Food in the Last Year: Never true  Transportation Needs: No Transportation Needs (06/24/2022)   PRAPARE - Administrator, Civil Service (Medical): No    Lack of Transportation (Non-Medical): No  Physical Activity: Inactive (06/24/2022)   Exercise Vital Sign    Days of Exercise per Week: 0 days    Minutes of Exercise per Session: 0 min  Stress: No Stress Concern Present (06/24/2022)   [employer] of Occupational Health - Occupational Stress Questionnaire  Feeling of Stress : Not at all  Social Connections: Moderately Isolated (06/24/2022)   Social Connection and Isolation Panel [NHANES]    Frequency of Communication with Friends and Family: More than three times a week    Frequency of Social Gatherings with Friends and Family: Three times a week    Attends Religious Services: Never    Active Member of Clubs or Organizations: No    Attends Engineer, structural: Never    Marital Status: Married   Family History  Problem Relation Age of Onset   Healthy Mother    Diabetes Brother    Colon polyps Neg Hx    Colon cancer Neg Hx    Esophageal cancer Neg Hx    Rectal cancer Neg Hx    Stomach cancer Neg Hx    Allergies  Allergen Reactions   Beef-Derived Products     Cultural preference   Fish-Derived Products     Does not eat fish    Pork-Derived Products Other (See Comments)    Cultural preference    Current Outpatient Medications  Medication Sig Dispense Refill   acetaminophen (TYLENOL) 325 MG tablet Take 1-2 tablets (325-650 mg total) by mouth every 4 (four) hours as needed for mild pain.     amLODipine (NORVASC) 5 MG tablet Take 1 tablet (5 mg total) by mouth daily. 90 tablet 1   aspirin 81 MG EC tablet Take 1 tablet (81 mg total) by mouth daily. Swallow whole. 90 tablet 1   baclofen (LIORESAL) 10 MG tablet Take 1 tablet (10 mg total) by mouth 3 (three) times daily. 270 tablet 1   citalopram (CELEXA) 20 MG tablet Take 1 tablet (20 mg total) by mouth daily. 90 tablet 1   HYDROcodone-acetaminophen (NORCO) 5-325 MG tablet Take 1 tablet by mouth every 6 (six) hours as needed for moderate pain. 30 tablet 0   omeprazole (PRILOSEC) 40 MG capsule Take 1 capsule (40 mg total) by mouth daily. 90 capsule 1   polyethylene glycol powder (GLYCOLAX/MIRALAX) 17 GM/SCOOP powder Take 17 grams by mouth daily. (Patient taking differently: Take 17 g by mouth daily as needed for moderate constipation.) 510 g 1   rosuvastatin (CRESTOR) 20 MG tablet Take 1 tablet (20 mg total) by mouth daily. 90 tablet 1   tamsulosin (FLOMAX) 0.4 MG CAPS capsule Take 2 capsules (0.8 mg total) by mouth daily. 180 capsule 1   vitamin B-12 (CYANOCOBALAMIN) 1000 MCG tablet Take 1 tablet (1,000 mcg total) by mouth daily. 90 tablet 1   No current facility-administered medications for this visit.   No results found.  Review of Systems:   A ROS was performed including pertinent positives and negatives as documented in the HPI.  Physical Exam :   Constitutional: NAD and appears stated age Neurological: Alert and oriented Psych: Appropriate affect and cooperative There were no vitals taken for this visit.   Comprehensive Musculoskeletal Exam:    His right shoulder has quite limited range of motion with essentially no active forward elevation external rotation  at the side in the setting of previous stroke.  He does have pain radiating down arm with a positive Spurling maneuver.  Imaging:   Xray (3 views right shoulder): Normal   I personally reviewed and interpreted the radiographs.   Assessment:   67 y.o. male with right shoulder pain and weakness although I do believe the majority of this is a result of the stroke.  I did discuss that this is overall very difficult  to improve given the previous stroke.  That being said I do believe that there is some component of cervical radiculitis that is likely worsening as range of motion and pain.  At this time I did discuss treatment options and I do believe initially beginning with some occupational therapy to strengthen and optimize the remaining function of his right upper extremity would be of best benefit.  Plan :    -Occupational Therapy ordered for strengthening and optimization of his residual right upper extremity function     I personally saw and evaluated the patient, and participated in the management and treatment plan.  Huel Cote, MD Attending Physician, Orthopedic Surgery  This document was dictated using Dragon voice recognition software. A reasonable attempt at proof reading has been made to minimize errors.

## 2022-07-02 ENCOUNTER — Ambulatory Visit: Payer: 59

## 2022-07-02 DIAGNOSIS — M25562 Pain in left knee: Secondary | ICD-10-CM | POA: Diagnosis not present

## 2022-07-02 DIAGNOSIS — M170 Bilateral primary osteoarthritis of knee: Secondary | ICD-10-CM | POA: Diagnosis not present

## 2022-07-02 DIAGNOSIS — M6281 Muscle weakness (generalized): Secondary | ICD-10-CM

## 2022-07-02 DIAGNOSIS — R2689 Other abnormalities of gait and mobility: Secondary | ICD-10-CM | POA: Diagnosis not present

## 2022-07-02 DIAGNOSIS — G8929 Other chronic pain: Secondary | ICD-10-CM

## 2022-07-02 DIAGNOSIS — M25561 Pain in right knee: Secondary | ICD-10-CM | POA: Diagnosis not present

## 2022-07-02 LAB — HEMOCHROMATOSIS DNA-PCR(C282Y,H63D)

## 2022-07-02 NOTE — Therapy (Signed)
OUTPATIENT PHYSICAL THERAPY TREATMENT   Patient Name: Aaron Dennis MRN: [id number] DOB:1955/09/11, 67 y.o., male Today's Date: 07/02/2022  END OF SESSION:  PT End of Session - 07/02/22 1403     Visit Number 3    Number of Visits 17    Date for PT Re-Evaluation 08/18/22    Authorization Type [employer] Medicare    PT Start Time 1404    PT Stop Time 1443    PT Time Calculation (min) 39 min    Activity Tolerance Patient tolerated treatment well    Behavior During Therapy WFL for tasks assessed/performed               Past Medical History:  Diagnosis Date   Constipation    Depression    GERD (gastroesophageal reflux disease)    Hypertension    Lung nodule    7 mm LUL nodule   Polycythemia    Stroke (HCC) 05/01/2021   acute left frontoparietal infarct, residual right-sided hemiparesis   Vertebral artery aneurysm (HCC)    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   Vitamin D deficiency    Past Surgical History:  Procedure Laterality Date   IR RADIOLOGIST EVAL & MGMT  07/02/2021   IR RADIOLOGIST EVAL & MGMT  07/22/2021   LIPOMA EXCISION     SHOULDER ARTHROSCOPY WITH SUBACROMIAL DECOMPRESSION Right 10/23/2021   Procedure: RIGHT SHOULDER ARTHROSCOPY WITH DEBRIDEMENT, SUBACROMIAL DECOMPRESSION, MANIPULATION UNDER ANESTHESIA;  Surgeon: Kathryne Hitch, MD;  Location: [facility];  Service: Orthopedics;  Laterality: Right;   Patient Active Problem List   Diagnosis Date Noted   Benign prostatic hyperplasia with nocturia 06/11/2022   Arthrofibrosis of right shoulder 10/23/2021   Aneurysm, cerebral, nonruptured 08/13/2021   Status post stroke due to cerebrovascular disease    Cerebral edema (HCC) 04/29/2021   Stroke (cerebrum) (HCC) 04/29/2021   Right sided weakness    Acute CVA (cerebrovascular accident) (HCC) 04/27/2021   Primary osteoarthritis of left knee 01/13/2019   Primary osteoarthritis of right knee 01/13/2019   Flat foot 11/28/2017   Tendonitis of foot  11/28/2017   Polycythemia 05/12/2017   Vitamin D deficiency 04/21/2017   Bony sclerosis 08/05/2016   Lung nodule    Chronic pain of both knees 12/05/2015   Gastroesophageal reflux disease without esophagitis 05/17/2014   Essential hypertension 05/17/2014   Coronary artery calcification 08/17/2012   Abnormal LFTs (liver function tests) 07/11/2012   Chronic abdominal pain 06/24/2012   Right-sided chest wall pain 06/24/2012   Constipation, chronic 06/24/2012   Hyperbilirubinemia 06/24/2012   Chronic back pain 06/24/2012   DDD (degenerative disc disease), thoracolumbar 06/24/2012   History of nephrolithiasis 06/24/2012   Side pain 06/24/2012   Low back pain at multiple sites 06/24/2012    PCP: [name], MD  REFERRING PROVIDER: Kathryne Hitch, MD  REFERRING DIAG: M17.0 (ICD-10-CM) - Bilateral primary osteoarthritis of knee   THERAPY DIAG:  Chronic pain of right knee  Chronic pain of left knee  Muscle weakness (generalized)  Rationale for Evaluation and Treatment: Rehabilitation  ONSET DATE: Chronic  SUBJECTIVE:   SUBJECTIVE STATEMENT: Pt presents to PT with reports of continued bilateral knee pain. Has tried to be compliant with HEP with no adverse effect noted. Notes no change in overall symptoms. Has an OT referral for his R UE.  PERTINENT HISTORY: CVA (R Sided Weakness), HTN  PAIN:  Are you having pain?  Yes: NPRS scale: 8/10 Worst: 10/10 Pain location: bilateral knees (lateral L knee, posterior  R knee) Pain description: sharp Aggravating factors: transfers, ambulating Relieving factors: none  PRECAUTIONS: None  WEIGHT BEARING RESTRICTIONS: No  OBJECTIVE:   VITALS:   BP: 138/65  PATIENT SURVEYS:  FOTO: 39% function; 55% predicted  LOWER EXTREMITY ROM:  Active ROM Right eval Left eval  Hip flexion    Hip extension    Hip abduction    Hip adduction    Hip internal rotation    Hip external rotation    Knee flexion 115 120   Knee extension 9 0  Ankle dorsiflexion    Ankle plantarflexion    Ankle inversion    Ankle eversion     (Blank rows = not tested)  LOWER EXTREMITY MMT:  MMT Right eval Left eval  Hip flexion 2+/5 4/5  Hip extension    Hip abduction 2+/5 4/5  Hip adduction    Hip internal rotation    Hip external rotation    Knee flexion 3/5 4/5  Knee extension 2+/5 4/5  Ankle dorsiflexion    Ankle plantarflexion    Ankle inversion    Ankle eversion     (Blank rows = not tested)  FUNCTIONAL TESTS:  30 Second Sit to Stand: 1 rep - increased pain; attempted two more times but had LoB (on eval)  TREATMENT: [facility] Adult PT Treatment:                                                DATE: 07/02/2022 Therapeutic Exercise: NuStep L3 LE only x 4 min Seated clamshell 2x15 RTB Seated march x 20 RTB Seated ball squeeze 2x10 - 5" hold LAQ with add (ball) 2x10 LAQ 2x10 1# Supine quad set into towel x 10 - 5" hold each Supine SLR 2x10 each (R difficult) SAQ 2x10 (R difficult)  [facility] Adult PT Treatment:                                                DATE: 06/29/2022 Therapeutic Exercise: NuStep L3 LE only x 5 min LAQ 2x10 Seated ball squeeze 2x10 - 5" hold LAQ with add (ball) 2x10 Seated clamshell 2x15 RTB Seated march 2x20 RTB Supine quad set into towel 2x10 - 5" hold each Manual Therapy: Manual stretching to bliateral hamstrings PA and AP mobs to R knee in flexed and extended positions Grade II  [facility] Adult PT Treatment:                                                DATE: 06/23/2022 Therapeutic Exercise: LAQ x 5  Seated hamstring stretch x 30" L Supine quad sets x 5 - 5" hold Seated hip abd x 10 RTB  PATIENT EDUCATION:  Education details: continue HEP Person educated: Patient Education method:  N/A Education comprehension:  N/A  HOME EXERCISE PROGRAM: Access Code: [email or web address] URL: [email or web address] Date: 06/23/2022 Prepared by: Edwinna Areola  Exercises - Seated  Long Arc Quad  - 1 x daily - 7 x weekly - 2 sets - 10 reps - Seated Hamstring Stretch  - 1 x daily - 7 x weekly - 2-3 reps -  30 seconds hold - Supine Quadricep Sets  - 1 x daily - 7 x weekly - 2 sets - 10 reps - 5 sec hold - Seated Hip Abduction with Resistance  - 1 x daily - 7 x weekly - 3 sets - 10 reps - red band hold  ASSESSMENT:  CLINICAL IMPRESSION: Pt tolerated treatment fair but was limited by R sided weakness and severe knee pain. Therapy focused on quad strengthening and improving knee ROM. Will continue to progress as able per POC.   OBJECTIVE IMPAIRMENTS: Abnormal gait, decreased activity tolerance, decreased mobility, difficulty walking, decreased ROM, decreased strength, and pain.   ACTIVITY LIMITATIONS: carrying, lifting, bending, standing, squatting, stairs, transfers, reach over head, and locomotion level  PARTICIPATION LIMITATIONS: meal prep, cleaning, laundry, driving, shopping, community activity, and yard work  PERSONAL FACTORS: Time since onset of injury/illness/exacerbation and 3+ comorbidities: CVA (R Sided Weakness), HTN  are also affecting patient's functional outcome.    GOALS: Goals reviewed with patient? No  SHORT TERM GOALS: Target date: 07/14/2022   Pt will be compliant and knowledgeable with initial HEP for improved comfort and carryover Baseline: initial HEP given  Goal status: INITIAL  2.  Pt will self report bilateral knee pain no greater than 6/10 for improved comfort and functional ability Baseline: 10/10 at worst Goal status: INITIAL   LONG TERM GOALS: Target date: 08/18/2022   Pt will improve FOTO function score to no less than 55% as proxy for functional improvement Baseline: 39% function Goal status: INITIAL   2.  Pt will self report bilateral knee pain no greater than 3/10 for improved comfort and functional ability Baseline: 10/10 at worst Goal status: INITIAL   3.  Pt will increase 30 Second Sit to Stand rep count to no less than 3  reps for improved balance, strength, and functional mobility Baseline: 1 reps  Goal status: INITIAL   4.  Pt will improve R knee AROM to range of 3-115 degrees for improved comfort and functional mobility Baseline: see ROM chart Goal status: INITIAL   PLAN:  PT FREQUENCY: 1-2x/week  PT DURATION: 8 weeks  PLANNED INTERVENTIONS: Therapeutic exercises, Therapeutic activity, Neuromuscular re-education, Balance training, Gait training, Patient/Family education, Self Care, and Joint mobilization  PLAN FOR NEXT SESSION: assess HEP response, LE strengthening, gait   Eloy End, PT 07/02/2022, 2:45 PM

## 2022-07-07 ENCOUNTER — Emergency Department (HOSPITAL_BASED_OUTPATIENT_CLINIC_OR_DEPARTMENT_OTHER): Payer: 59

## 2022-07-07 ENCOUNTER — Other Ambulatory Visit: Payer: Self-pay

## 2022-07-07 ENCOUNTER — Encounter (HOSPITAL_BASED_OUTPATIENT_CLINIC_OR_DEPARTMENT_OTHER): Payer: Self-pay

## 2022-07-07 ENCOUNTER — Other Ambulatory Visit (HOSPITAL_BASED_OUTPATIENT_CLINIC_OR_DEPARTMENT_OTHER): Payer: Self-pay

## 2022-07-07 ENCOUNTER — Emergency Department (HOSPITAL_BASED_OUTPATIENT_CLINIC_OR_DEPARTMENT_OTHER)
Admission: EM | Admit: 2022-07-07 | Discharge: 2022-07-07 | Disposition: A | Discharge status: Home / Self Care | Payer: 59 | Attending: Emergency Medicine | Admitting: Emergency Medicine

## 2022-07-07 DIAGNOSIS — M47812 Spondylosis without myelopathy or radiculopathy, cervical region: Secondary | ICD-10-CM | POA: Diagnosis not present

## 2022-07-07 DIAGNOSIS — R110 Nausea: Secondary | ICD-10-CM | POA: Diagnosis not present

## 2022-07-07 DIAGNOSIS — M5412 Radiculopathy, cervical region: Secondary | ICD-10-CM | POA: Insufficient documentation

## 2022-07-07 DIAGNOSIS — M542 Cervicalgia: Secondary | ICD-10-CM | POA: Diagnosis not present

## 2022-07-07 LAB — CBC WITH DIFFERENTIAL/PLATELET
Abs Immature Granulocytes: 0.03 10*3/uL (ref 0.00–0.07)
Basophils Absolute: 0 10*3/uL (ref 0.0–0.1)
Basophils Relative: 1 %
Eosinophils Absolute: 0 10*3/uL (ref 0.0–0.5)
Eosinophils Relative: 0 %
HCT: 45 % (ref 39.0–52.0)
Hemoglobin: 14.7 g/dL (ref 13.0–17.0)
Immature Granulocytes: 0 %
Lymphocytes Relative: 15 %
Lymphs Abs: 1.1 10*3/uL (ref 0.7–4.0)
MCH: 26.2 pg (ref 26.0–34.0)
MCHC: 32.7 g/dL (ref 30.0–36.0)
MCV: 80.2 fL (ref 80.0–100.0)
Monocytes Absolute: 0.5 10*3/uL (ref 0.1–1.0)
Monocytes Relative: 7 %
Neutro Abs: 5.7 10*3/uL (ref 1.7–7.7)
Neutrophils Relative %: 77 %
Platelets: 206 10*3/uL (ref 150–400)
RBC: 5.61 MIL/uL (ref 4.22–5.81)
RDW: 13.7 % (ref 11.5–15.5)
WBC: 7.3 10*3/uL (ref 4.0–10.5)
nRBC: 0 % (ref 0.0–0.2)

## 2022-07-07 LAB — COMPREHENSIVE METABOLIC PANEL
ALT: 9 U/L (ref 0–44)
AST: 14 U/L — ABNORMAL LOW (ref 15–41)
Albumin: 4 g/dL (ref 3.5–5.0)
Alkaline Phosphatase: 107 U/L (ref 38–126)
Anion gap: 7 (ref 5–15)
BUN: 10 mg/dL (ref 8–23)
CO2: 26 mmol/L (ref 22–32)
Calcium: 9.1 mg/dL (ref 8.9–10.3)
Chloride: 105 mmol/L (ref 98–111)
Creatinine, Ser: 0.82 mg/dL (ref 0.61–1.24)
GFR, Estimated: >60 mL/min (ref >60)
Glucose, Bld: 118 mg/dL — ABNORMAL HIGH (ref 70–99)
Potassium: 3.8 mmol/L (ref 3.5–5.1)
Sodium: 138 mmol/L (ref 135–145)
Total Bilirubin: 1.6 mg/dL — ABNORMAL HIGH (ref 0.3–1.2)
Total Protein: 6.9 g/dL (ref 6.5–8.1)

## 2022-07-07 LAB — TROPONIN I (HIGH SENSITIVITY): Troponin I (High Sensitivity): 2 ng/L (ref <18)

## 2022-07-07 MED ORDER — METHOCARBAMOL 500 MG PO TABS
500.0000 mg | ORAL_TABLET | Freq: Two times a day (BID) | ORAL | 0 refills | Status: DC
  Filled 2022-07-07: qty 20, 10d supply, fill #0

## 2022-07-07 NOTE — ED Notes (Signed)
Patient transported to CT 

## 2022-07-07 NOTE — ED Notes (Signed)
Pt discharged to home using teachback Method. Discharge instructions have been discussed with patient and/or family members. Pt verbally acknowledges understanding d/c instructions, has been given opportunity for questions to be answered, and endorses comprehension to checkout at registration before leaving.  

## 2022-07-07 NOTE — ED Notes (Signed)
ED Provider at bedside. 

## 2022-07-07 NOTE — ED Provider Notes (Signed)
[facility]MENT AT [facility] Provider Note   CSN: [id number] Arrival date & time: 07/07/22  1434     History  Chief Complaint  Patient presents with   Neck Pain    Aaron Dennis is a 67 y.o. male.  Patient presents to the emergency department complaining of left-sided neck and upper shoulder pain which began 1 to 2 days ago.  He denies any trauma or fall prior to the onset.  Patient also complains of some intermittent dizziness and nausea.  Patient with history of stroke with right-sided deficits.  Patient with history of right-sided shoulder pain with surgical intervention.  He denies shortness of breath, abdominal, vomiting, fevers.  Past medical history otherwise significant for chronic back pain, chronic constipation, GERD, hypertension, arthrofibrosis of right shoulder  HPI     Home Medications Prior to Admission medications   Medication Sig Start Date End Date Taking? Authorizing Provider  methocarbamol (ROBAXIN) 500 MG tablet Take 1 tablet (500 mg total) by mouth 2 (two) times daily. 07/07/22  Yes Darrick Grinder, PA-C  acetaminophen (TYLENOL) 325 MG tablet Take 1-2 tablets (325-650 mg total) by mouth every 4 (four) hours as needed for mild pain. 05/19/21   Setzer, Lynnell Jude, PA-C  amLODipine (NORVASC) 5 MG tablet Take 1 tablet (5 mg total) by mouth daily. 06/11/22   [name], MD  aspirin 81 MG EC tablet Take 1 tablet (81 mg total) by mouth daily. Swallow whole. 06/11/21   [name], MD  baclofen (LIORESAL) 10 MG tablet Take 1 tablet (10 mg total) by mouth 3 (three) times daily. 06/11/22   [name], MD  citalopram (CELEXA) 20 MG tablet Take 1 tablet (20 mg total) by mouth daily. 06/11/22   [name], MD  HYDROcodone-acetaminophen (NORCO) 5-325 MG tablet Take 1 tablet by mouth every 6 (six) hours as needed for moderate pain. 11/24/21   Kirtland Bouchard, PA-C  omeprazole (PRILOSEC) 40 MG capsule Take 1 capsule (40 mg total) by mouth  daily. 06/11/22   [name], MD  polyethylene glycol powder (GLYCOLAX/MIRALAX) 17 GM/SCOOP powder Take 17 grams by mouth daily. Patient taking differently: Take 17 g by mouth daily as needed for moderate constipation. 10/07/21   [name], MD  rosuvastatin (CRESTOR) 20 MG tablet Take 1 tablet (20 mg total) by mouth daily. 06/11/22   [name], MD  tamsulosin (FLOMAX) 0.4 MG CAPS capsule Take 2 capsules (0.8 mg total) by mouth daily. 06/11/22   [name], MD  vitamin B-12 (CYANOCOBALAMIN) 1000 MCG tablet Take 1 tablet (1,000 mcg total) by mouth daily. 08/15/20   [name], MD      Allergies    Beef-derived products, Fish-derived products, and Pork-derived products    Review of Systems   Review of Systems  Physical Exam Updated Vital Signs BP 135/87   Pulse 72   Temp 97.9 F (36.6 C) (Temporal)   Resp 19   Ht 5\' 10"  (1.778 m)   Wt 77.1 kg   SpO2 100%   BMI 24.39 kg/m  Physical Exam Vitals and nursing note reviewed.  Constitutional:      General: He is not in acute distress.    Appearance: He is well-developed.  HENT:     Head: Normocephalic and atraumatic.  Eyes:     Extraocular Movements: Extraocular movements intact.     Conjunctiva/sclera: Conjunctivae normal.     Pupils: Pupils are equal, round, and reactive to light.  Cardiovascular:  Rate and Rhythm: Normal rate and regular rhythm.     Heart sounds: No murmur heard. Pulmonary:     Effort: Pulmonary effort is normal. No respiratory distress.     Breath sounds: Normal breath sounds.  Abdominal:     Palpations: Abdomen is soft.     Tenderness: There is no abdominal tenderness.  Musculoskeletal:        General: Tenderness (Mild tenderness to palpation of the left sternocleidal mastoid and trapezius muscle) present. No swelling.     Cervical back: Neck supple.  Skin:    General: Skin is warm and dry.     Capillary Refill: Capillary refill takes less than 2 seconds.  Neurological:     Mental  Status: He is alert.     Comments: Right-sided weakness consistent with baseline.  No left-sided deficits appreciated.  Psychiatric:        Mood and Affect: Mood normal.     ED Results / Procedures / Treatments   Labs (all labs ordered are listed, but only abnormal results are displayed) Labs Reviewed  COMPREHENSIVE METABOLIC PANEL - Abnormal; Notable for the following components:      Result Value   Glucose, Bld 118 (*)    AST 14 (*)    Total Bilirubin 1.6 (*)    All other components within normal limits  CBC WITH DIFFERENTIAL/PLATELET  TROPONIN I (HIGH SENSITIVITY)    EKG None  Radiology CT Cervical Spine Wo Contrast  Result Date: 07/07/2022 CLINICAL DATA:  Acute on chronic neck pain radiating to the left back and shoulder. EXAM: CT CERVICAL SPINE WITHOUT CONTRAST TECHNIQUE: Multidetector CT imaging of the cervical spine was performed without intravenous contrast. Multiplanar CT image reconstructions were also generated. RADIATION DOSE REDUCTION: This exam was performed according to the departmental dose-optimization program which includes automated exposure control, adjustment of the mA and/or kV according to patient size and/or use of iterative reconstruction technique. COMPARISON:  Radiographs 06/27/2020 and MRI 08/27/2011 FINDINGS: Alignment: 2.5 mm grade 1 degenerative anterolisthesis at C7-T1. Skull base and vertebrae: Generalized sclerosis. Suspected vertebral hemangiomas at C6 and C7. No fracture or acute bony findings. Unerupted left maxillary molar with associated lucency favoring cavity on image 26 series 6. Mild dextroconvex lower cervical scoliosis. Soft tissues and spinal canal: Unremarkable Disc levels: C2-3: Unremarkable C3-4: Mild left foraminal stenosis and mild central narrowing of the thecal sac due to short pedicles, disc bulge, and left facet spurring. C4-5: Mild to moderate central narrowing of the thecal sac due to disc bulge and central disc protrusion. C5-6:  Borderline central narrowing of the thecal sac due to left eccentric intervertebral spurring and short pedicles. C6-7: Unremarkable C7-T1: Mild to moderate left foraminal stenosis due to disc uncovering along with facet arthropathy. Upper chest: Unremarkable Other: No supplemental non-categorized findings. IMPRESSION: 1. Cervical spondylosis and degenerative disc disease causing mild to moderate impingement at C4-5 C7-T1; and mild impingement at C3-4, as detailed above. 2. Unerupted left maxillary molar with associated lucency favoring cavity. 3. Mild dextroconvex lower cervical scoliosis. 4. Chronic generalized bony sclerosis. Electronically Signed   By: Gaylyn Rong M.D.   On: 07/07/2022 16:12    Procedures Procedures    Medications Ordered in ED Medications - No data to display  ED Course/ Medical Decision Making/ A&P                             Medical Decision Making Amount and/or Complexity of Data Reviewed  Labs: ordered. Radiology: ordered.   This patient presents to the ED for concern of neck pain, this involves an extensive number of treatment options, and is a complaint that carries with it a high risk of complications and morbidity.  The differential diagnosis includes degenerative disc disease, fracture, dislocation, others   Co morbidities that complicate the patient evaluation  History of CVA   Additional history obtained:  Additional history obtained from visitors at bedside External records from outside source obtained and reviewed including orthopedic notes from May 22   Lab Tests:  I Ordered, and personally interpreted labs.  The pertinent results include: Unremarkable CMP, CBC, troponin 2   Imaging Studies ordered:  I ordered imaging studies including CT cervical spine without contrast I independently visualized and interpreted imaging which showed  1. Cervical spondylosis and degenerative disc disease causing mild  to moderate impingement at C4-5  C7-T1; and mild impingement at C3-4,  as detailed above.  2. Unerupted left maxillary molar with associated lucency favoring  cavity.  3. Mild dextroconvex lower cervical scoliosis.  4. Chronic generalized bony sclerosis   I agree with the radiologist interpretation   Cardiac Monitoring: / EKG:  The patient was maintained on a cardiac monitor.  I personally viewed and interpreted the cardiac monitored which showed an underlying rhythm of: sinus rhythm   Test / Admission - Considered:  Patient with left-sided neck pain.  Cervical CT showing mild to moderate impingement at C4-C5, C7-T1 and mild impingement at C3-C4.  Feel this is the likely cause of the patient's symptoms.  Patient endorsed mild nausea but laboratory workup was unremarkable.  Abdomen is soft and nontender.  No indication for further workup for nausea at this time.  Patient requesting muscle relaxant which seems perfectly reasonable.  Plan to discharge home on muscle relaxants and recommendations for follow-up with orthopedic surgery as needed.  Patient voices understanding with plan.  Return precautions provided         Final Clinical Impression(s) / ED Diagnoses Final diagnoses:  Cervical radiculopathy  Nausea    Rx / DC Orders ED Discharge Orders          Ordered    methocarbamol (ROBAXIN) 500 MG tablet  2 times daily        07/07/22 1649              Pamala Duffel 07/07/22 1651    Linwood Dibbles, MD 07/08/22 1555

## 2022-07-07 NOTE — Discharge Instructions (Addendum)
You were evaluated today for neck pain.  Your imaging shows signs of degenerative disc disease with mild to moderate impingement in the cervical spine.  I have attached a copy of the impression from the CT scan below.  As requested I have prescribed a muscle relaxant.  Please take as directed.  This will cause drowsiness and I do not recommend taking it if you are planning to drive or operate heavy machinery.  Do not take other muscle relaxants while taking this medication.  Please follow-up with your primary care provider or orthopedics for further evaluation and management.  1. Cervical spondylosis and degenerative disc disease causing mild  to moderate impingement at C4-5 C7-T1; and mild impingement at C3-4,  as detailed above.  2. Unerupted left maxillary molar with associated lucency favoring  cavity.  3. Mild dextroconvex lower cervical scoliosis.  4. Chronic generalized bony sclerosis

## 2022-07-07 NOTE — ED Triage Notes (Addendum)
Patient here POV from Home.  Endorses Neck pain that began 24-48 Hours ago. States it radiates to Left Back/Shoulder. No Trauma/Injury recently. Worse with movement. Some Dizziness as well.  NAD Noted during Triage. A&Ox4. GCS 15. BIB Wheelchair.

## 2022-07-08 ENCOUNTER — Ambulatory Visit: Payer: 59

## 2022-07-08 DIAGNOSIS — M170 Bilateral primary osteoarthritis of knee: Secondary | ICD-10-CM | POA: Diagnosis not present

## 2022-07-08 DIAGNOSIS — M25561 Pain in right knee: Secondary | ICD-10-CM | POA: Diagnosis not present

## 2022-07-08 DIAGNOSIS — G8929 Other chronic pain: Secondary | ICD-10-CM | POA: Diagnosis not present

## 2022-07-08 DIAGNOSIS — R2689 Other abnormalities of gait and mobility: Secondary | ICD-10-CM | POA: Diagnosis not present

## 2022-07-08 DIAGNOSIS — M6281 Muscle weakness (generalized): Secondary | ICD-10-CM

## 2022-07-08 DIAGNOSIS — M25562 Pain in left knee: Secondary | ICD-10-CM | POA: Diagnosis not present

## 2022-07-08 NOTE — Therapy (Signed)
OUTPATIENT PHYSICAL THERAPY TREATMENT   Patient Name: Aaron Dennis MRN: [id number] DOB:August 28, 1955, 67 y.o., male Today's Date: 07/08/2022  END OF SESSION:  PT End of Session - 07/08/22 1134     Visit Number 4    Number of Visits 17    Date for PT Re-Evaluation 08/18/22    Authorization Type [employer]    PT Start Time 1134   arrived late   PT Stop Time 1212    PT Time Calculation (min) 38 min    Activity Tolerance Patient tolerated treatment well    Behavior During Therapy WFL for tasks assessed/performed                Past Medical History:  Diagnosis Date   Constipation    Depression    GERD (gastroesophageal reflux disease)    Hypertension    Lung nodule    7 mm LUL nodule   Polycythemia    Stroke (HCC) 05/01/2021   acute left frontoparietal infarct, residual right-sided hemiparesis   Vertebral artery aneurysm (HCC)    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   Vitamin D deficiency    Past Surgical History:  Procedure Laterality Date   IR RADIOLOGIST EVAL & MGMT  07/02/2021   IR RADIOLOGIST EVAL & MGMT  07/22/2021   LIPOMA EXCISION     SHOULDER ARTHROSCOPY WITH SUBACROMIAL DECOMPRESSION Right 10/23/2021   Procedure: RIGHT SHOULDER ARTHROSCOPY WITH DEBRIDEMENT, SUBACROMIAL DECOMPRESSION, MANIPULATION UNDER ANESTHESIA;  Surgeon: Kathryne Hitch, MD;  Location: [facility];  Service: Orthopedics;  Laterality: Right;   Patient Active Problem List   Diagnosis Date Noted   Benign prostatic hyperplasia with nocturia 06/11/2022   Arthrofibrosis of right shoulder 10/23/2021   Aneurysm, cerebral, nonruptured 08/13/2021   Status post stroke due to cerebrovascular disease    Cerebral edema (HCC) 04/29/2021   Stroke (cerebrum) (HCC) 04/29/2021   Right sided weakness    Acute CVA (cerebrovascular accident) (HCC) 04/27/2021   Primary osteoarthritis of left knee 01/13/2019   Primary osteoarthritis of right knee 01/13/2019   Flat foot 11/28/2017    Tendonitis of foot 11/28/2017   Polycythemia 05/12/2017   Vitamin D deficiency 04/21/2017   Bony sclerosis 08/05/2016   Lung nodule    Chronic pain of both knees 12/05/2015   Gastroesophageal reflux disease without esophagitis 05/17/2014   Essential hypertension 05/17/2014   Coronary artery calcification 08/17/2012   Abnormal LFTs (liver function tests) 07/11/2012   Chronic abdominal pain 06/24/2012   Right-sided chest wall pain 06/24/2012   Constipation, chronic 06/24/2012   Hyperbilirubinemia 06/24/2012   Chronic back pain 06/24/2012   DDD (degenerative disc disease), thoracolumbar 06/24/2012   History of nephrolithiasis 06/24/2012   Side pain 06/24/2012   Low back pain at multiple sites 06/24/2012    PCP: [name], MD  REFERRING PROVIDER: Kathryne Hitch, MD  REFERRING DIAG: M17.0 (ICD-10-CM) - Bilateral primary osteoarthritis of knee   THERAPY DIAG:  Chronic pain of right knee  Chronic pain of left knee  Muscle weakness (generalized)  Rationale for Evaluation and Treatment: Rehabilitation  ONSET DATE: Chronic  SUBJECTIVE:   SUBJECTIVE STATEMENT: Pt presents to PT with continued reports of severe bilateral knee pain. Went to ED yesterday due to neck pain, which is doing better at present. Has been compliant with HEP per report.   PERTINENT HISTORY: CVA (R Sided Weakness), HTN  PAIN:  Are you having pain?  Yes: NPRS scale: 8/10 Worst: 10/10 Pain location: bilateral knees (lateral L knee,  posterior R knee) Pain description: sharp Aggravating factors: transfers, ambulating Relieving factors: none  PRECAUTIONS: None  WEIGHT BEARING RESTRICTIONS: No  OBJECTIVE:   VITALS:   BP: 127/84  PATIENT SURVEYS:  FOTO: 39% function; 55% predicted  LOWER EXTREMITY ROM:  Active ROM Right eval Left eval  Hip flexion    Hip extension    Hip abduction    Hip adduction    Hip internal rotation    Hip external rotation    Knee flexion 115 120   Knee extension 9 0  Ankle dorsiflexion    Ankle plantarflexion    Ankle inversion    Ankle eversion     (Blank rows = not tested)  LOWER EXTREMITY MMT:  MMT Right eval Left eval  Hip flexion 2+/5 4/5  Hip extension    Hip abduction 2+/5 4/5  Hip adduction    Hip internal rotation    Hip external rotation    Knee flexion 3/5 4/5  Knee extension 2+/5 4/5  Ankle dorsiflexion    Ankle plantarflexion    Ankle inversion    Ankle eversion     (Blank rows = not tested)  FUNCTIONAL TESTS:  30 Second Sit to Stand: 1 rep - increased pain; attempted two more times but had LoB (on eval)  TREATMENT: [facility] Adult PT Treatment:                                                DATE: 07/08/2022 Therapeutic Exercise: LAQ 2x10 2# Seated march 2x20 2# Seated hamstring curl 2x10 RTB Seated clamshell 2x15 RTB Seated ball squeeze 2x10 - 5" hold LAQ with add (ball) 2x10 Standing TKE with ball 2x10 Supine quad set into towel x 10 - 5" hold each Supine SLR 2x10 each (R difficult) SAQ 2x10 (R difficult)  [facility] Adult PT Treatment:                                                DATE: 07/02/2022 Therapeutic Exercise: NuStep L3 LE only x 4 min Seated clamshell 2x15 RTB Seated march x 20 RTB Seated ball squeeze 2x10 - 5" hold LAQ with add (ball) 2x10 LAQ 2x10 1# Supine quad set into towel x 10 - 5" hold each Supine SLR 2x10 each (R difficult) SAQ 2x10 (R difficult)  [facility] Adult PT Treatment:                                                DATE: 06/29/2022 Therapeutic Exercise: NuStep L3 LE only x 5 min LAQ 2x10 Seated ball squeeze 2x10 - 5" hold LAQ with add (ball) 2x10 Seated clamshell 2x15 RTB Seated march 2x20 RTB Supine quad set into towel 2x10 - 5" hold each Manual Therapy: Manual stretching to bliateral hamstrings PA and AP mobs to R knee in flexed and extended positions Grade II  [facility] Adult PT Treatment:  DATE: 06/23/2022 Therapeutic  Exercise: LAQ x 5  Seated hamstring stretch x 30" L Supine quad sets x 5 - 5" hold Seated hip abd x 10 RTB  PATIENT EDUCATION:  Education details: continue HEP Person educated: Patient Education method:  N/A Education comprehension:  N/A  HOME EXERCISE PROGRAM: Access Code: [email or web address] URL: [email or web address] Date: 06/23/2022 Prepared by: Edwinna Areola  Exercises - Seated Long Arc Quad  - 1 x daily - 7 x weekly - 2 sets - 10 reps - Seated Hamstring Stretch  - 1 x daily - 7 x weekly - 2-3 reps - 30 seconds hold - Supine Quadricep Sets  - 1 x daily - 7 x weekly - 2 sets - 10 reps - 5 sec hold - Seated Hip Abduction with Resistance  - 1 x daily - 7 x weekly - 3 sets - 10 reps - red band hold  ASSESSMENT:  CLINICAL IMPRESSION: Pt tolerated treatment better today and was able to progress LE strengthening exercises. Therapy focused on quad and proximal hip strengthening in order to decrease pain and improve function. Will continue to progress as able per POC.   OBJECTIVE IMPAIRMENTS: Abnormal gait, decreased activity tolerance, decreased mobility, difficulty walking, decreased ROM, decreased strength, and pain.   ACTIVITY LIMITATIONS: carrying, lifting, bending, standing, squatting, stairs, transfers, reach over head, and locomotion level  PARTICIPATION LIMITATIONS: meal prep, cleaning, laundry, driving, shopping, community activity, and yard work  PERSONAL FACTORS: Time since onset of injury/illness/exacerbation and 3+ comorbidities: CVA (R Sided Weakness), HTN  are also affecting patient's functional outcome.    GOALS: Goals reviewed with patient? No  SHORT TERM GOALS: Target date: 07/14/2022   Pt will be compliant and knowledgeable with initial HEP for improved comfort and carryover Baseline: initial HEP given  Goal status: INITIAL  2.  Pt will self report bilateral knee pain no greater than 6/10 for improved comfort and functional ability Baseline: 10/10 at  worst Goal status: INITIAL   LONG TERM GOALS: Target date: 08/18/2022   Pt will improve FOTO function score to no less than 55% as proxy for functional improvement Baseline: 39% function Goal status: INITIAL   2.  Pt will self report bilateral knee pain no greater than 3/10 for improved comfort and functional ability Baseline: 10/10 at worst Goal status: INITIAL   3.  Pt will increase 30 Second Sit to Stand rep count to no less than 3 reps for improved balance, strength, and functional mobility Baseline: 1 reps  Goal status: INITIAL   4.  Pt will improve R knee AROM to range of 3-115 degrees for improved comfort and functional mobility Baseline: see ROM chart Goal status: INITIAL   PLAN:  PT FREQUENCY: 1-2x/week  PT DURATION: 8 weeks  PLANNED INTERVENTIONS: Therapeutic exercises, Therapeutic activity, Neuromuscular re-education, Balance training, Gait training, Patient/Family education, Self Care, and Joint mobilization  PLAN FOR NEXT SESSION: assess HEP response, LE strengthening, gait   Eloy End, PT 07/08/2022, 12:14 PM

## 2022-07-09 DIAGNOSIS — R3912 Poor urinary stream: Secondary | ICD-10-CM | POA: Diagnosis not present

## 2022-07-09 DIAGNOSIS — R350 Frequency of micturition: Secondary | ICD-10-CM | POA: Diagnosis not present

## 2022-07-12 ENCOUNTER — Emergency Department (HOSPITAL_BASED_OUTPATIENT_CLINIC_OR_DEPARTMENT_OTHER)
Admission: EM | Admit: 2022-07-12 | Discharge: 2022-07-12 | Disposition: A | Discharge status: Home / Self Care | Payer: 59 | Attending: Emergency Medicine | Admitting: Emergency Medicine

## 2022-07-12 ENCOUNTER — Other Ambulatory Visit: Payer: Self-pay

## 2022-07-12 ENCOUNTER — Encounter (HOSPITAL_BASED_OUTPATIENT_CLINIC_OR_DEPARTMENT_OTHER): Payer: Self-pay

## 2022-07-12 DIAGNOSIS — R112 Nausea with vomiting, unspecified: Secondary | ICD-10-CM | POA: Diagnosis not present

## 2022-07-12 DIAGNOSIS — I10 Essential (primary) hypertension: Secondary | ICD-10-CM | POA: Diagnosis not present

## 2022-07-12 DIAGNOSIS — Z7982 Long term (current) use of aspirin: Secondary | ICD-10-CM | POA: Insufficient documentation

## 2022-07-12 DIAGNOSIS — Z79899 Other long term (current) drug therapy: Secondary | ICD-10-CM | POA: Insufficient documentation

## 2022-07-12 DIAGNOSIS — R9431 Abnormal electrocardiogram [ECG] [EKG]: Secondary | ICD-10-CM | POA: Diagnosis not present

## 2022-07-12 LAB — COMPREHENSIVE METABOLIC PANEL
ALT: 11 U/L (ref 0–44)
AST: 15 U/L (ref 15–41)
Albumin: 4.2 g/dL (ref 3.5–5.0)
Alkaline Phosphatase: 110 U/L (ref 38–126)
Anion gap: 9 (ref 5–15)
BUN: 20 mg/dL (ref 8–23)
CO2: 24 mmol/L (ref 22–32)
Calcium: 9.3 mg/dL (ref 8.9–10.3)
Chloride: 106 mmol/L (ref 98–111)
Creatinine, Ser: 0.72 mg/dL (ref 0.61–1.24)
GFR, Estimated: >60 mL/min (ref >60)
Glucose, Bld: 120 mg/dL — ABNORMAL HIGH (ref 70–99)
Potassium: 3.5 mmol/L (ref 3.5–5.1)
Sodium: 139 mmol/L (ref 135–145)
Total Bilirubin: 1.1 mg/dL (ref 0.3–1.2)
Total Protein: 7.2 g/dL (ref 6.5–8.1)

## 2022-07-12 LAB — CBC
HCT: 44.4 % (ref 39.0–52.0)
Hemoglobin: 14.5 g/dL (ref 13.0–17.0)
MCH: 25.9 pg — ABNORMAL LOW (ref 26.0–34.0)
MCHC: 32.7 g/dL (ref 30.0–36.0)
MCV: 79.4 fL — ABNORMAL LOW (ref 80.0–100.0)
Platelets: 237 10*3/uL (ref 150–400)
RBC: 5.59 MIL/uL (ref 4.22–5.81)
RDW: 13.8 % (ref 11.5–15.5)
WBC: 8.6 10*3/uL (ref 4.0–10.5)
nRBC: 0 % (ref 0.0–0.2)

## 2022-07-12 LAB — LIPASE, BLOOD: Lipase: 19 U/L (ref 11–51)

## 2022-07-12 MED ORDER — ONDANSETRON HCL 4 MG PO TABS: 4.0000 mg | ORAL_TABLET | Freq: Four times a day (QID) | ORAL | 0 refills | Status: AC | PRN

## 2022-07-12 MED ORDER — ONDANSETRON 4 MG PO TBDP
4.0000 mg | ORAL_TABLET | Freq: Once | ORAL | Status: AC
  Administered 2022-07-12: 4 mg via ORAL
  Filled 2022-07-12: qty 1

## 2022-07-12 NOTE — ED Triage Notes (Signed)
Patient here POV from Home.  Endorses N/V since 1200 today. Some Headache and Dizziness. No Known Fevers. No Diarrhea.   NAD Noted during Triage. A&Ox4. GCS 15. BIB Wheelchair.

## 2022-07-12 NOTE — Discharge Instructions (Signed)
Return to the ED with any new or worsening signs or symptoms Please have the patient follow-up with his PCP for further management and reevaluation Please pick up prescription of Zofran that I have sent to your pharmacy on file Please have the patient continue to push fluids such as water or Gatorade

## 2022-07-12 NOTE — ED Provider Notes (Signed)
[place] EMERGENCY DEPARTMENT AT [facility] Provider Note   CSN: [id number] Arrival date & time: 07/12/22  1335     History  Chief Complaint  Patient presents with   Vomiting    Aaron Dennis is a 67 y.o. male with medical history of stroke with residual right-sided deficits, constipation, depression, GERD, hypertension, vitamin D deficiency.  Patient presents to the ED with his son for evaluation of nausea and vomiting.  The patient reports that he ate a feta cheese sandwich this morning around 10:30 AM.  Patient reports that for about 1-1/2 hours he developed nausea and vomiting.  The patient states that he arrived very nauseous to the ED but while he has been waiting for the last 2 hours he feels much better at this time.  The patient denies any fevers, diarrhea, abdominal pain, dysuria.  He denies blood in vomit.  He denies any chest pain or shortness of breath.  He denies medications prior to arrival.  HPI     Home Medications Prior to Admission medications   Medication Sig Start Date End Date Taking? Authorizing Provider  ondansetron (ZOFRAN) 4 MG tablet Take 1 tablet (4 mg total) by mouth every 6 (six) hours as needed for nausea or vomiting. 07/12/22  Yes Al Decant, PA-C  acetaminophen (TYLENOL) 325 MG tablet Take 1-2 tablets (325-650 mg total) by mouth every 4 (four) hours as needed for mild pain. 05/19/21   Setzer, Lynnell Jude, PA-C  amLODipine (NORVASC) 5 MG tablet Take 1 tablet (5 mg total) by mouth daily. 06/11/22   [name], MD  aspirin 81 MG EC tablet Take 1 tablet (81 mg total) by mouth daily. Swallow whole. 06/11/21   [name], MD  baclofen (LIORESAL) 10 MG tablet Take 1 tablet (10 mg total) by mouth 3 (three) times daily. 06/11/22   [name], MD  citalopram (CELEXA) 20 MG tablet Take 1 tablet (20 mg total) by mouth daily. 06/11/22   [name], MD  HYDROcodone-acetaminophen (NORCO) 5-325 MG tablet Take 1 tablet by mouth every 6 (six)  hours as needed for moderate pain. 11/24/21   Kirtland Bouchard, PA-C  methocarbamol (ROBAXIN) 500 MG tablet Take 1 tablet (500 mg total) by mouth 2 (two) times daily. 07/07/22   Darrick Grinder, PA-C  omeprazole (PRILOSEC) 40 MG capsule Take 1 capsule (40 mg total) by mouth daily. 06/11/22   [name], MD  polyethylene glycol powder (GLYCOLAX/MIRALAX) 17 GM/SCOOP powder Take 17 grams by mouth daily. Patient taking differently: Take 17 g by mouth daily as needed for moderate constipation. 10/07/21   [name], MD  rosuvastatin (CRESTOR) 20 MG tablet Take 1 tablet (20 mg total) by mouth daily. 06/11/22   [name], MD  tamsulosin (FLOMAX) 0.4 MG CAPS capsule Take 2 capsules (0.8 mg total) by mouth daily. 06/11/22   [name], MD  vitamin B-12 (CYANOCOBALAMIN) 1000 MCG tablet Take 1 tablet (1,000 mcg total) by mouth daily. 08/15/20   [name], MD      Allergies    Beef-derived products, Fish-derived products, and Pork-derived products    Review of Systems   Review of Systems  Constitutional:  Negative for fever.  Gastrointestinal:  Positive for nausea and vomiting. Negative for abdominal pain and diarrhea.  All other systems reviewed and are negative.   Physical Exam Updated Vital Signs BP 131/87 (BP Location: Left Arm)   Pulse 77   Temp 97.8 F (36.6 C) (Temporal)   Resp 18  Ht 5\' 10"  (1.778 m)   Wt 77.1 kg   SpO2 97%   BMI 24.39 kg/m  Physical Exam Vitals and nursing note reviewed.  Constitutional:      General: He is not in acute distress.    Appearance: Normal appearance. He is not ill-appearing, toxic-appearing or diaphoretic.  HENT:     Head: Normocephalic and atraumatic.     Nose: Nose normal.     Mouth/Throat:     Mouth: Mucous membranes are moist.     Pharynx: Oropharynx is clear.  Eyes:     Extraocular Movements: Extraocular movements intact.     Conjunctiva/sclera: Conjunctivae normal.     Pupils: Pupils are equal, round, and reactive to  light.  Cardiovascular:     Rate and Rhythm: Normal rate and regular rhythm.  Pulmonary:     Effort: Pulmonary effort is normal.     Breath sounds: Normal breath sounds. No wheezing.  Abdominal:     General: Abdomen is flat. Bowel sounds are normal.     Palpations: Abdomen is soft.     Tenderness: There is no abdominal tenderness.  Musculoskeletal:     Cervical back: Normal range of motion and neck supple. No tenderness.  Skin:    General: Skin is warm and dry.     Capillary Refill: Capillary refill takes less than 2 seconds.  Neurological:     Mental Status: He is alert and oriented to person, place, and time.     Comments: Decreased strength to right upper extremity however this is baseline for the patient.  5 out of 5 strength bilateral lower extremities.     ED Results / Procedures / Treatments   Labs (all labs ordered are listed, but only abnormal results are displayed) Labs Reviewed  COMPREHENSIVE METABOLIC PANEL - Abnormal; Notable for the following components:      Result Value   Glucose, Bld 120 (*)    All other components within normal limits  CBC - Abnormal; Notable for the following components:   MCV 79.4 (*)    MCH 25.9 (*)    All other components within normal limits  LIPASE, BLOOD  URINALYSIS, ROUTINE W REFLEX MICROSCOPIC    EKG None  Radiology No results found.  Procedures Procedures   Medications Ordered in ED Medications  ondansetron (ZOFRAN-ODT) disintegrating tablet 4 mg (4 mg Oral Given 07/12/22 1635)    ED Course/ Medical Decision Making/ A&P  Medical Decision Making Amount and/or Complexity of Data Reviewed Labs: ordered.  Risk Prescription drug management.   67 year old male presents to the ED for evaluation.  Please see HPI for further details.  On examination the patient is afebrile and nontachycardic.  His lung sounds are clear bilaterally and he is not hypoxic on room air.  Abdomen soft and compressible throughout with no  tenderness.  No CVA tenderness bilaterally.  Neurological examination at baseline.   CBC without cytosis or anemia.  Metabolic panel shows glucose of 120 however no other electrolyte derangement, no elevated LFT, no anion gap elevation.  Lipase WNL.  Urinalysis pending at this time.  On reassessment the patient reports feeling much better after given Zofran.  The patient be discharged home at this time and he will follow-up with his PCP.  He was given return precautions and he and his son both voiced understanding.  The patient had all of his questions answered to his satisfaction.  He is stable to discharge at this time.  Final Clinical Impression(s) / ED  Diagnoses Final diagnoses:  Nausea and vomiting, unspecified vomiting type    Rx / DC Orders ED Discharge Orders          Ordered    ondansetron (ZOFRAN) 4 MG tablet  Every 6 hours PRN        07/12/22 1655              Al Decant, PA-C 07/12/22 1655    Melene Plan, DO 07/12/22 1815

## 2022-07-13 ENCOUNTER — Ambulatory Visit: Payer: 59 | Attending: Orthopaedic Surgery

## 2022-07-13 DIAGNOSIS — M25561 Pain in right knee: Secondary | ICD-10-CM | POA: Diagnosis not present

## 2022-07-13 DIAGNOSIS — M25562 Pain in left knee: Secondary | ICD-10-CM | POA: Diagnosis not present

## 2022-07-13 DIAGNOSIS — M6281 Muscle weakness (generalized): Secondary | ICD-10-CM

## 2022-07-13 DIAGNOSIS — G8929 Other chronic pain: Secondary | ICD-10-CM

## 2022-07-13 DIAGNOSIS — R2689 Other abnormalities of gait and mobility: Secondary | ICD-10-CM | POA: Insufficient documentation

## 2022-07-13 NOTE — Therapy (Signed)
OUTPATIENT PHYSICAL THERAPY TREATMENT   Patient Name: Aaron Dennis MRN: [id number] DOB:1955/08/26, 67 y.o., male Today's Date: 07/13/2022  END OF SESSION:  PT End of Session - 07/13/22 1052     Visit Number 5    Number of Visits 17    Date for PT Re-Evaluation 08/18/22    Authorization Type [employer] Medicare    PT Start Time 1052    PT Stop Time 1130    PT Time Calculation (min) 38 min    Activity Tolerance Patient tolerated treatment well    Behavior During Therapy WFL for tasks assessed/performed                 Past Medical History:  Diagnosis Date   Constipation    Depression    GERD (gastroesophageal reflux disease)    Hypertension    Lung nodule    7 mm LUL nodule   Polycythemia    Stroke (HCC) 05/01/2021   acute left frontoparietal infarct, residual right-sided hemiparesis   Vertebral artery aneurysm (HCC)    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   Vitamin D deficiency    Past Surgical History:  Procedure Laterality Date   IR RADIOLOGIST EVAL & MGMT  07/02/2021   IR RADIOLOGIST EVAL & MGMT  07/22/2021   LIPOMA EXCISION     SHOULDER ARTHROSCOPY WITH SUBACROMIAL DECOMPRESSION Right 10/23/2021   Procedure: RIGHT SHOULDER ARTHROSCOPY WITH DEBRIDEMENT, SUBACROMIAL DECOMPRESSION, MANIPULATION UNDER ANESTHESIA;  Surgeon: Kathryne Hitch, MD;  Location: [facility];  Service: Orthopedics;  Laterality: Right;   Patient Active Problem List   Diagnosis Date Noted   Benign prostatic hyperplasia with nocturia 06/11/2022   Arthrofibrosis of right shoulder 10/23/2021   Aneurysm, cerebral, nonruptured 08/13/2021   Status post stroke due to cerebrovascular disease    Cerebral edema (HCC) 04/29/2021   Stroke (cerebrum) (HCC) 04/29/2021   Right sided weakness    Acute CVA (cerebrovascular accident) (HCC) 04/27/2021   Primary osteoarthritis of left knee 01/13/2019   Primary osteoarthritis of right knee 01/13/2019   Flat foot 11/28/2017   Tendonitis of foot  11/28/2017   Polycythemia 05/12/2017   Vitamin D deficiency 04/21/2017   Bony sclerosis 08/05/2016   Lung nodule    Chronic pain of both knees 12/05/2015   Gastroesophageal reflux disease without esophagitis 05/17/2014   Essential hypertension 05/17/2014   Coronary artery calcification 08/17/2012   Abnormal LFTs (liver function tests) 07/11/2012   Chronic abdominal pain 06/24/2012   Right-sided chest wall pain 06/24/2012   Constipation, chronic 06/24/2012   Hyperbilirubinemia 06/24/2012   Chronic back pain 06/24/2012   DDD (degenerative disc disease), thoracolumbar 06/24/2012   History of nephrolithiasis 06/24/2012   Side pain 06/24/2012   Low back pain at multiple sites 06/24/2012    PCP: [name], MD  REFERRING PROVIDER: Kathryne Hitch, MD  REFERRING DIAG: M17.0 (ICD-10-CM) - Bilateral primary osteoarthritis of knee   THERAPY DIAG:  Chronic pain of right knee  Chronic pain of left knee  Muscle weakness (generalized)  Rationale for Evaluation and Treatment: Rehabilitation  ONSET DATE: Chronic  SUBJECTIVE:   SUBJECTIVE STATEMENT: Pt presents to PT with continued reports of severe bilateral knee pain. Has been compliant with HEP per report.   PERTINENT HISTORY: CVA (R Sided Weakness), HTN  PAIN:  Are you having pain?  Yes: NPRS scale: 8/10 Worst: 10/10 Pain location: bilateral knees (lateral L knee, posterior R knee) Pain description: sharp Aggravating factors: transfers, ambulating Relieving factors: none  PRECAUTIONS: None  WEIGHT BEARING RESTRICTIONS: No  OBJECTIVE:   VITALS:   BP: 127/84  PATIENT SURVEYS:  FOTO: 39% function; 55% predicted  LOWER EXTREMITY ROM:  Active ROM Right eval Left eval  Hip flexion    Hip extension    Hip abduction    Hip adduction    Hip internal rotation    Hip external rotation    Knee flexion 115 120  Knee extension 9 0  Ankle dorsiflexion    Ankle plantarflexion    Ankle inversion     Ankle eversion     (Blank rows = not tested)  LOWER EXTREMITY MMT:  MMT Right eval Left eval  Hip flexion 2+/5 4/5  Hip extension    Hip abduction 2+/5 4/5  Hip adduction    Hip internal rotation    Hip external rotation    Knee flexion 3/5 4/5  Knee extension 2+/5 4/5  Ankle dorsiflexion    Ankle plantarflexion    Ankle inversion    Ankle eversion     (Blank rows = not tested)  FUNCTIONAL TESTS:  30 Second Sit to Stand: 1 rep - increased pain; attempted two more times but had LoB (on eval)  TREATMENT: [facility] Adult PT Treatment:                                                DATE: 07/13/2022 Therapeutic Exercise: LAQ 2x10 2# Seated march 2x20 2# Seated ball squeeze x 10  LAQ with add x 15 Seated hamstring curl 2x10 GTB Standing TKE with ball x 10 Supine quad set into towel x 10 - 5" hold each Supine SLR 2x10 each (R difficult) SAQ 2x10 (R difficult) Supine clamshell 2x10 GTB Bridge 3x10  Supine march 2x20 GTB Repeated flexion in sitting x 10  [facility] Adult PT Treatment:                                                DATE: 07/08/2022 Therapeutic Exercise: LAQ 2x10 2# Seated march 2x20 2# Seated hamstring curl 2x10 RTB Seated clamshell 2x15 RTB Seated ball squeeze 2x10 - 5" hold LAQ with add (ball) 2x10 Standing TKE with ball 2x10 Supine quad set into towel x 10 - 5" hold each Supine SLR 2x10 each (R difficult) SAQ 2x10 (R difficult)  [facility] Adult PT Treatment:                                                DATE: 07/02/2022 Therapeutic Exercise: NuStep L3 LE only x 4 min Seated clamshell 2x15 RTB Seated march x 20 RTB Seated ball squeeze 2x10 - 5" hold LAQ with add (ball) 2x10 LAQ 2x10 1# Supine quad set into towel x 10 - 5" hold each Supine SLR 2x10 each (R difficult) SAQ 2x10 (R difficult)  [facility] Adult PT Treatment:                                                DATE:  06/29/2022 Therapeutic Exercise: NuStep L3 LE only x 5 min LAQ 2x10 Seated ball squeeze  2x10 - 5" hold LAQ with add (ball) 2x10 Seated clamshell 2x15 RTB Seated march 2x20 RTB Supine quad set into towel 2x10 - 5" hold each Manual Therapy: Manual stretching to bliateral hamstrings PA and AP mobs to R knee in flexed and extended positions Grade II  [facility] Adult PT Treatment:                                                DATE: 06/23/2022 Therapeutic Exercise: LAQ x 5  Seated hamstring stretch x 30" L Supine quad sets x 5 - 5" hold Seated hip abd x 10 RTB  PATIENT EDUCATION:  Education details: continue HEP Person educated: Patient Education method:  N/A Education comprehension:  N/A  HOME EXERCISE PROGRAM: Access Code: [email or web address] URL: [email or web address] Date: 06/23/2022 Prepared by: Edwinna Areola  Exercises - Seated Long Arc Quad  - 1 x daily - 7 x weekly - 2 sets - 10 reps - Seated Hamstring Stretch  - 1 x daily - 7 x weekly - 2-3 reps - 30 seconds hold - Supine Quadricep Sets  - 1 x daily - 7 x weekly - 2 sets - 10 reps - 5 sec hold - Seated Hip Abduction with Resistance  - 1 x daily - 7 x weekly - 3 sets - 10 reps - red band hold  ASSESSMENT:  CLINICAL IMPRESSION: Pt was able to complete all prescribed exercises with no adverse effect or increase in pain. Therapy focused on quad and proximal hip strengthening in order to decrease pain and improve function. Did more and progressed exercises in supine today, with improved tolerance noted. Will continue to progress as able per POC.   OBJECTIVE IMPAIRMENTS: Abnormal gait, decreased activity tolerance, decreased mobility, difficulty walking, decreased ROM, decreased strength, and pain.   ACTIVITY LIMITATIONS: carrying, lifting, bending, standing, squatting, stairs, transfers, reach over head, and locomotion level  PARTICIPATION LIMITATIONS: meal prep, cleaning, laundry, driving, shopping, community activity, and yard work  PERSONAL FACTORS: Time since onset of injury/illness/exacerbation and 3+  comorbidities: CVA (R Sided Weakness), HTN  are also affecting patient's functional outcome.    GOALS: Goals reviewed with patient? No  SHORT TERM GOALS: Target date: 07/14/2022   Pt will be compliant and knowledgeable with initial HEP for improved comfort and carryover Baseline: initial HEP given  Goal status: INITIAL  2.  Pt will self report bilateral knee pain no greater than 6/10 for improved comfort and functional ability Baseline: 10/10 at worst Goal status: INITIAL   LONG TERM GOALS: Target date: 08/18/2022   Pt will improve FOTO function score to no less than 55% as proxy for functional improvement Baseline: 39% function Goal status: INITIAL   2.  Pt will self report bilateral knee pain no greater than 3/10 for improved comfort and functional ability Baseline: 10/10 at worst Goal status: INITIAL   3.  Pt will increase 30 Second Sit to Stand rep count to no less than 3 reps for improved balance, strength, and functional mobility Baseline: 1 reps  Goal status: INITIAL   4.  Pt will improve R knee AROM to range of 3-115 degrees for improved comfort and functional mobility Baseline: see ROM chart Goal status: INITIAL   PLAN:  PT FREQUENCY: 1-2x/week  PT DURATION: 8 weeks  PLANNED INTERVENTIONS: Therapeutic exercises, Therapeutic activity, Neuromuscular re-education, Balance training, Gait training, Patient/Family education, Self Care, and Joint mobilization  PLAN FOR NEXT SESSION: assess HEP response, LE strengthening, gait   Eloy End, PT 07/13/2022, 11:30 AM

## 2022-07-14 NOTE — Therapy (Signed)
OUTPATIENT PHYSICAL THERAPY TREATMENT   Patient Name: Aaron Dennis MRN: [id number] DOB:1955-06-24, 67 y.o., male Today's Date: 07/15/2022  END OF SESSION:  PT End of Session - 07/15/22 1047     Visit Number 6    Number of Visits 17    Date for PT Re-Evaluation 08/18/22    Authorization Type [employer] Medicare    PT Start Time 1047    PT Stop Time 1127    PT Time Calculation (min) 40 min    Activity Tolerance Patient tolerated treatment well    Behavior During Therapy WFL for tasks assessed/performed              Past Medical History:  Diagnosis Date   Constipation    Depression    GERD (gastroesophageal reflux disease)    Hypertension    Lung nodule    7 mm LUL nodule   Polycythemia    Stroke (HCC) 05/01/2021   acute left frontoparietal infarct, residual right-sided hemiparesis   Vertebral artery aneurysm (HCC)    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   Vitamin D deficiency    Past Surgical History:  Procedure Laterality Date   IR RADIOLOGIST EVAL & MGMT  07/02/2021   IR RADIOLOGIST EVAL & MGMT  07/22/2021   LIPOMA EXCISION     SHOULDER ARTHROSCOPY WITH SUBACROMIAL DECOMPRESSION Right 10/23/2021   Procedure: RIGHT SHOULDER ARTHROSCOPY WITH DEBRIDEMENT, SUBACROMIAL DECOMPRESSION, MANIPULATION UNDER ANESTHESIA;  Surgeon: Kathryne Hitch, MD;  Location: [facility];  Service: Orthopedics;  Laterality: Right;   Patient Active Problem List   Diagnosis Date Noted   Benign prostatic hyperplasia with nocturia 06/11/2022   Arthrofibrosis of right shoulder 10/23/2021   Aneurysm, cerebral, nonruptured 08/13/2021   Status post stroke due to cerebrovascular disease    Cerebral edema (HCC) 04/29/2021   Stroke (cerebrum) (HCC) 04/29/2021   Right sided weakness    Acute CVA (cerebrovascular accident) (HCC) 04/27/2021   Primary osteoarthritis of left knee 01/13/2019   Primary osteoarthritis of right knee 01/13/2019   Flat foot 11/28/2017   Tendonitis of foot  11/28/2017   Polycythemia 05/12/2017   Vitamin D deficiency 04/21/2017   Bony sclerosis 08/05/2016   Lung nodule    Chronic pain of both knees 12/05/2015   Gastroesophageal reflux disease without esophagitis 05/17/2014   Essential hypertension 05/17/2014   Coronary artery calcification 08/17/2012   Abnormal LFTs (liver function tests) 07/11/2012   Chronic abdominal pain 06/24/2012   Right-sided chest wall pain 06/24/2012   Constipation, chronic 06/24/2012   Hyperbilirubinemia 06/24/2012   Chronic back pain 06/24/2012   DDD (degenerative disc disease), thoracolumbar 06/24/2012   History of nephrolithiasis 06/24/2012   Side pain 06/24/2012   Low back pain at multiple sites 06/24/2012    PCP: [name], MD  REFERRING PROVIDER: Kathryne Hitch, MD  REFERRING DIAG: M17.0 (ICD-10-CM) - Bilateral primary osteoarthritis of knee   THERAPY DIAG:  Chronic pain of right knee  Chronic pain of left knee  Muscle weakness (generalized)  Rationale for Evaluation and Treatment: Rehabilitation  ONSET DATE: Chronic  SUBJECTIVE:   SUBJECTIVE STATEMENT: Patient reports that his neck pain has improved, but his knees remain very painful.   PERTINENT HISTORY: CVA (R Sided Weakness), HTN  PAIN:  Are you having pain?  Yes: NPRS scale: 8/10 Worst: 10/10 Pain location: bilateral knees (lateral L knee, posterior R knee) Pain description: sharp Aggravating factors: transfers, ambulating Relieving factors: none  PRECAUTIONS: None  WEIGHT BEARING RESTRICTIONS: No  OBJECTIVE:  VITALS:   BP: 127/84  PATIENT SURVEYS:  FOTO: 39% function; 55% predicted  LOWER EXTREMITY ROM:  Active ROM Right eval Left eval  Hip flexion    Hip extension    Hip abduction    Hip adduction    Hip internal rotation    Hip external rotation    Knee flexion 115 120  Knee extension 9 0  Ankle dorsiflexion    Ankle plantarflexion    Ankle inversion    Ankle eversion     (Blank  rows = not tested)  LOWER EXTREMITY MMT:  MMT Right eval Left eval  Hip flexion 2+/5 4/5  Hip extension    Hip abduction 2+/5 4/5  Hip adduction    Hip internal rotation    Hip external rotation    Knee flexion 3/5 4/5  Knee extension 2+/5 4/5  Ankle dorsiflexion    Ankle plantarflexion    Ankle inversion    Ankle eversion     (Blank rows = not tested)  FUNCTIONAL TESTS:  30 Second Sit to Stand: 1 rep - increased pain; attempted two more times but had LoB (on eval)  TREATMENT: [facility] Adult PT Treatment:                                                DATE: 07/15/22 Therapeutic Exercise: LAQ 2x10 2# Seated march 2x20 2# Seated ball squeeze 2 x 10  LAQ with add 2x10 Seated hamstring curl 2x10 GTB Supine quad set into towel x 10 - 5" hold each Supine SLR 2x10 each (R difficult) SAQ 2x10 (R difficult) Supine clamshell 2x10 GTB Bridge 3x10  Supine hip adduction 2x10 Supine march 2x20 GTB   [facility] Adult PT Treatment:                                                DATE: 07/13/2022 Therapeutic Exercise: LAQ 2x10 2# Seated march 2x20 2# Seated ball squeeze x 10  LAQ with add x 15 Seated hamstring curl 2x10 GTB Standing TKE with ball x 10 Supine quad set into towel x 10 - 5" hold each Supine SLR 2x10 each (R difficult) SAQ 2x10 (R difficult) Supine clamshell 2x10 GTB Bridge 3x10  Supine march 2x20 GTB Repeated flexion in sitting x 10  [facility] Adult PT Treatment:                                                DATE: 07/08/2022 Therapeutic Exercise: LAQ 2x10 2# Seated march 2x20 2# Seated hamstring curl 2x10 RTB Seated clamshell 2x15 RTB Seated ball squeeze 2x10 - 5" hold LAQ with add (ball) 2x10 Standing TKE with ball 2x10 Supine quad set into towel x 10 - 5" hold each Supine SLR 2x10 each (R difficult) SAQ 2x10 (R difficult)    PATIENT EDUCATION:  Education details: continue HEP Person educated: Patient Education method:  N/A Education comprehension:  N/A  HOME  EXERCISE PROGRAM: Access Code: [email or web address] URL: [email or web address] Date: 06/23/2022 Prepared by: Edwinna Areola  Exercises - Seated Long Arc Quad  - 1 x daily - 7 x weekly -  2 sets - 10 reps - Seated Hamstring Stretch  - 1 x daily - 7 x weekly - 2-3 reps - 30 seconds hold - Supine Quadricep Sets  - 1 x daily - 7 x weekly - 2 sets - 10 reps - 5 sec hold - Seated Hip Abduction with Resistance  - 1 x daily - 7 x weekly - 3 sets - 10 reps - red band hold  ASSESSMENT:  CLINICAL IMPRESSION: Patient presents to PT reporting continued high levels of BIL knee pain. Session today continued to focus on proximal hip and LE strengthening. Patient was able to tolerate all prescribed exercises with no adverse effects. Patient continues to benefit from skilled PT services and should be progressed as able to improve functional independence.    OBJECTIVE IMPAIRMENTS: Abnormal gait, decreased activity tolerance, decreased mobility, difficulty walking, decreased ROM, decreased strength, and pain.   ACTIVITY LIMITATIONS: carrying, lifting, bending, standing, squatting, stairs, transfers, reach over head, and locomotion level  PARTICIPATION LIMITATIONS: meal prep, cleaning, laundry, driving, shopping, community activity, and yard work  PERSONAL FACTORS: Time since onset of injury/illness/exacerbation and 3+ comorbidities: CVA (R Sided Weakness), HTN  are also affecting patient's functional outcome.    GOALS: Goals reviewed with patient? No  SHORT TERM GOALS: Target date: 07/14/2022   Pt will be compliant and knowledgeable with initial HEP for improved comfort and carryover Baseline: initial HEP given  Goal status: MET Pt reports adherence 07/15/22  2.  Pt will self report bilateral knee pain no greater than 6/10 for improved comfort and functional ability Baseline: 10/10 at worst Goal status: MET   LONG TERM GOALS: Target date: 08/18/2022   Pt will improve FOTO function score to no less  than 55% as proxy for functional improvement Baseline: 39% function Goal status: INITIAL   2.  Pt will self report bilateral knee pain no greater than 3/10 for improved comfort and functional ability Baseline: 10/10 at worst Goal status: INITIAL   3.  Pt will increase 30 Second Sit to Stand rep count to no less than 3 reps for improved balance, strength, and functional mobility Baseline: 1 reps  Goal status: INITIAL   4.  Pt will improve R knee AROM to range of 3-115 degrees for improved comfort and functional mobility Baseline: see ROM chart Goal status: INITIAL   PLAN:  PT FREQUENCY: 1-2x/week  PT DURATION: 8 weeks  PLANNED INTERVENTIONS: Therapeutic exercises, Therapeutic activity, Neuromuscular re-education, Balance training, Gait training, Patient/Family education, Self Care, and Joint mobilization  PLAN FOR NEXT SESSION: assess HEP response, LE strengthening, gait   Berta Minor, PTA 07/15/2022, 11:30 AM

## 2022-07-15 ENCOUNTER — Ambulatory Visit: Payer: 59

## 2022-07-15 DIAGNOSIS — G8929 Other chronic pain: Secondary | ICD-10-CM

## 2022-07-15 DIAGNOSIS — M25562 Pain in left knee: Secondary | ICD-10-CM | POA: Diagnosis not present

## 2022-07-15 DIAGNOSIS — M6281 Muscle weakness (generalized): Secondary | ICD-10-CM

## 2022-07-15 DIAGNOSIS — M25561 Pain in right knee: Secondary | ICD-10-CM | POA: Diagnosis not present

## 2022-07-15 DIAGNOSIS — R2689 Other abnormalities of gait and mobility: Secondary | ICD-10-CM | POA: Diagnosis not present

## 2022-07-16 DIAGNOSIS — H04123 Dry eye syndrome of bilateral lacrimal glands: Secondary | ICD-10-CM | POA: Diagnosis not present

## 2022-07-17 NOTE — Therapy (Unsigned)
OUTPATIENT PHYSICAL THERAPY TREATMENT   Patient Name: Aaron Dennis MRN: [id number] DOB:[birth year], 67 y.o., male Today's Date: 07/15/2022  END OF SESSION:  PT End of Session - 07/15/22 1047     Visit Number 6    Number of Visits 17    Date for PT Re-Evaluation 08/18/22    Authorization Type [employer] Medicare    PT Start Time 1047    PT Stop Time 1127    PT Time Calculation (min) 40 min    Activity Tolerance Patient tolerated treatment well    Behavior During Therapy WFL for tasks assessed/performed              Past Medical History:  Diagnosis Date   Constipation    Depression    GERD (gastroesophageal reflux disease)    Hypertension    Lung nodule    7 mm LUL nodule   Polycythemia    Stroke (HCC) 05/01/2021   acute left frontoparietal infarct, residual right-sided hemiparesis   Vertebral artery aneurysm (HCC)    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   Vitamin D deficiency    Past Surgical History:  Procedure Laterality Date   IR RADIOLOGIST EVAL & MGMT  07/02/2021   IR RADIOLOGIST EVAL & MGMT  07/22/2021   LIPOMA EXCISION     SHOULDER ARTHROSCOPY WITH SUBACROMIAL DECOMPRESSION Right 10/23/2021   Procedure: RIGHT SHOULDER ARTHROSCOPY WITH DEBRIDEMENT, SUBACROMIAL DECOMPRESSION, MANIPULATION UNDER ANESTHESIA;  Surgeon: Kathryne Hitch, MD;  Location: [facility];  Service: Orthopedics;  Laterality: Right;   Patient Active Problem List   Diagnosis Date Noted   Benign prostatic hyperplasia with nocturia 06/11/2022   Arthrofibrosis of right shoulder 10/23/2021   Aneurysm, cerebral, nonruptured 08/13/2021   Status post stroke due to cerebrovascular disease    Cerebral edema (HCC) 04/29/2021   Stroke (cerebrum) (HCC) 04/29/2021   Right sided weakness    Acute CVA (cerebrovascular accident) (HCC) 04/27/2021   Primary osteoarthritis of left knee 01/13/2019   Primary osteoarthritis of right knee 01/13/2019   Flat foot 11/28/2017   Tendonitis of foot  11/28/2017   Polycythemia 05/12/2017   Vitamin D deficiency 04/21/2017   Bony sclerosis 08/05/2016   Lung nodule    Chronic pain of both knees 12/05/2015   Gastroesophageal reflux disease without esophagitis 05/17/2014   Essential hypertension 05/17/2014   Coronary artery calcification 08/17/2012   Abnormal LFTs (liver function tests) 07/11/2012   Chronic abdominal pain 06/24/2012   Right-sided chest wall pain 06/24/2012   Constipation, chronic 06/24/2012   Hyperbilirubinemia 06/24/2012   Chronic back pain 06/24/2012   DDD (degenerative disc disease), thoracolumbar 06/24/2012   History of nephrolithiasis 06/24/2012   Side pain 06/24/2012   Low back pain at multiple sites 06/24/2012    PCP: [name], MD  REFERRING PROVIDER: Kathryne Hitch, MD  REFERRING DIAG: M17.0 (ICD-10-CM) - Bilateral primary osteoarthritis of knee   THERAPY DIAG:  Chronic pain of right knee  Chronic pain of left knee  Muscle weakness (generalized)  Rationale for Evaluation and Treatment: Rehabilitation  ONSET DATE: Chronic  SUBJECTIVE:   SUBJECTIVE STATEMENT: Patient reports that his neck pain has improved, but his knees remain very painful.   PERTINENT HISTORY: CVA (R Sided Weakness), HTN  PAIN:  Are you having pain?  Yes: NPRS scale: 8/10 Worst: 10/10 Pain location: bilateral knees (lateral L knee, posterior R knee) Pain description: sharp Aggravating factors: transfers, ambulating Relieving factors: none  PRECAUTIONS: None  WEIGHT BEARING RESTRICTIONS: No  OBJECTIVE:  VITALS:   BP: 127/84  PATIENT SURVEYS:  FOTO: 39% function; 55% predicted  LOWER EXTREMITY ROM:  Active ROM Right eval Left eval  Hip flexion    Hip extension    Hip abduction    Hip adduction    Hip internal rotation    Hip external rotation    Knee flexion 115 120  Knee extension 9 0  Ankle dorsiflexion    Ankle plantarflexion    Ankle inversion    Ankle eversion     (Blank  rows = not tested)  LOWER EXTREMITY MMT:  MMT Right eval Left eval  Hip flexion 2+/5 4/5  Hip extension    Hip abduction 2+/5 4/5  Hip adduction    Hip internal rotation    Hip external rotation    Knee flexion 3/5 4/5  Knee extension 2+/5 4/5  Ankle dorsiflexion    Ankle plantarflexion    Ankle inversion    Ankle eversion     (Blank rows = not tested)  FUNCTIONAL TESTS:  30 Second Sit to Stand: 1 rep - increased pain; attempted two more times but had LoB (on eval)  TREATMENT: [facility] Adult PT Treatment:                                                DATE: 07/15/22 Therapeutic Exercise: LAQ 2x10 2# Seated march 2x20 2# Seated ball squeeze 2 x 10  LAQ with add 2x10 Seated hamstring curl 2x10 GTB Supine quad set into towel x 10 - 5" hold each Supine SLR 2x10 each (R difficult) SAQ 2x10 (R difficult) Supine clamshell 2x10 GTB Bridge 3x10  Supine hip adduction 2x10 Supine march 2x20 GTB   [facility] Adult PT Treatment:                                                DATE: 07/13/2022 Therapeutic Exercise: LAQ 2x10 2# Seated march 2x20 2# Seated ball squeeze x 10  LAQ with add x 15 Seated hamstring curl 2x10 GTB Standing TKE with ball x 10 Supine quad set into towel x 10 - 5" hold each Supine SLR 2x10 each (R difficult) SAQ 2x10 (R difficult) Supine clamshell 2x10 GTB Bridge 3x10  Supine march 2x20 GTB Repeated flexion in sitting x 10  [facility] Adult PT Treatment:                                                DATE: 07/08/2022 Therapeutic Exercise: LAQ 2x10 2# Seated march 2x20 2# Seated hamstring curl 2x10 RTB Seated clamshell 2x15 RTB Seated ball squeeze 2x10 - 5" hold LAQ with add (ball) 2x10 Standing TKE with ball 2x10 Supine quad set into towel x 10 - 5" hold each Supine SLR 2x10 each (R difficult) SAQ 2x10 (R difficult)    PATIENT EDUCATION:  Education details: continue HEP Person educated: Patient Education method:  N/A Education comprehension:  N/A  HOME  EXERCISE PROGRAM: Access Code: [email or web address] URL: [email or web address] Date: 06/23/2022 Prepared by: Edwinna Areola  Exercises - Seated Long Arc Quad  - 1 x daily - 7 x weekly -  2 sets - 10 reps - Seated Hamstring Stretch  - 1 x daily - 7 x weekly - 2-3 reps - 30 seconds hold - Supine Quadricep Sets  - 1 x daily - 7 x weekly - 2 sets - 10 reps - 5 sec hold - Seated Hip Abduction with Resistance  - 1 x daily - 7 x weekly - 3 sets - 10 reps - red band hold  ASSESSMENT:  CLINICAL IMPRESSION: Patient presents to PT reporting continued high levels of BIL knee pain. Session today continued to focus on proximal hip and LE strengthening. Patient was able to tolerate all prescribed exercises with no adverse effects. Patient continues to benefit from skilled PT services and should be progressed as able to improve functional independence.    OBJECTIVE IMPAIRMENTS: Abnormal gait, decreased activity tolerance, decreased mobility, difficulty walking, decreased ROM, decreased strength, and pain.   ACTIVITY LIMITATIONS: carrying, lifting, bending, standing, squatting, stairs, transfers, reach over head, and locomotion level  PARTICIPATION LIMITATIONS: meal prep, cleaning, laundry, driving, shopping, community activity, and yard work  PERSONAL FACTORS: Time since onset of injury/illness/exacerbation and 3+ comorbidities: CVA (R Sided Weakness), HTN  are also affecting patient's functional outcome.    GOALS: Goals reviewed with patient? No  SHORT TERM GOALS: Target date: 07/14/2022   Pt will be compliant and knowledgeable with initial HEP for improved comfort and carryover Baseline: initial HEP given  Goal status: MET Pt reports adherence 07/15/22  2.  Pt will self report bilateral knee pain no greater than 6/10 for improved comfort and functional ability Baseline: 10/10 at worst Goal status: MET   LONG TERM GOALS: Target date: 08/18/2022   Pt will improve FOTO function score to no less  than 55% as proxy for functional improvement Baseline: 39% function Goal status: INITIAL   2.  Pt will self report bilateral knee pain no greater than 3/10 for improved comfort and functional ability Baseline: 10/10 at worst Goal status: INITIAL   3.  Pt will increase 30 Second Sit to Stand rep count to no less than 3 reps for improved balance, strength, and functional mobility Baseline: 1 reps  Goal status: INITIAL   4.  Pt will improve R knee AROM to range of 3-115 degrees for improved comfort and functional mobility Baseline: see ROM chart Goal status: INITIAL   PLAN:  PT FREQUENCY: 1-2x/week  PT DURATION: 8 weeks  PLANNED INTERVENTIONS: Therapeutic exercises, Therapeutic activity, Neuromuscular re-education, Balance training, Gait training, Patient/Family education, Self Care, and Joint mobilization  PLAN FOR NEXT SESSION: assess HEP response, LE strengthening, gait   Berta Minor, PTA 07/15/2022, 11:30 AM

## 2022-07-20 ENCOUNTER — Ambulatory Visit: Payer: 59

## 2022-07-20 DIAGNOSIS — G8929 Other chronic pain: Secondary | ICD-10-CM | POA: Diagnosis not present

## 2022-07-20 DIAGNOSIS — M6281 Muscle weakness (generalized): Secondary | ICD-10-CM | POA: Diagnosis not present

## 2022-07-20 DIAGNOSIS — M25562 Pain in left knee: Secondary | ICD-10-CM | POA: Diagnosis not present

## 2022-07-20 DIAGNOSIS — R2689 Other abnormalities of gait and mobility: Secondary | ICD-10-CM | POA: Diagnosis not present

## 2022-07-20 DIAGNOSIS — M25561 Pain in right knee: Secondary | ICD-10-CM | POA: Diagnosis not present

## 2022-07-22 ENCOUNTER — Ambulatory Visit: Payer: 59

## 2022-07-22 DIAGNOSIS — R2689 Other abnormalities of gait and mobility: Secondary | ICD-10-CM

## 2022-07-22 DIAGNOSIS — M6281 Muscle weakness (generalized): Secondary | ICD-10-CM | POA: Diagnosis not present

## 2022-07-22 DIAGNOSIS — G8929 Other chronic pain: Secondary | ICD-10-CM

## 2022-07-22 DIAGNOSIS — M25562 Pain in left knee: Secondary | ICD-10-CM | POA: Diagnosis not present

## 2022-07-22 DIAGNOSIS — M25561 Pain in right knee: Secondary | ICD-10-CM | POA: Diagnosis not present

## 2022-07-22 NOTE — Therapy (Addendum)
OUTPATIENT PHYSICAL THERAPY TREATMENT/DISCHARGE  PHYSICAL THERAPY DISCHARGE SUMMARY  Visits from Start of Care: 8  Current functional level related to goals / functional outcomes: See goals and objective   Remaining deficits: See goals and objective   Education / Equipment: HEP   Patient agrees to discharge. Patient goals were not met. Patient is being discharged due to maximized rehab potential.    Patient Name: Aaron Dennis MRN: [id number] DOB:03-Mar-1955, 67 y.o., male Today's Date: 07/22/2022  END OF SESSION:  PT End of Session - 07/22/22 1042     Visit Number 8    Number of Visits 17    Date for PT Re-Evaluation 08/18/22    Authorization Type [employer] Medicare    PT Start Time 1043    PT Stop Time 1121    PT Time Calculation (min) 38 min    Activity Tolerance Patient tolerated treatment well    Behavior During Therapy WFL for tasks assessed/performed               Past Medical History:  Diagnosis Date   Constipation    Depression    GERD (gastroesophageal reflux disease)    Hypertension    Lung nodule    7 mm LUL nodule   Polycythemia    Stroke (HCC) 05/01/2021   acute left frontoparietal infarct, residual right-sided hemiparesis   Vertebral artery aneurysm (HCC)    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   Vitamin D deficiency    Past Surgical History:  Procedure Laterality Date   IR RADIOLOGIST EVAL & MGMT  07/02/2021   IR RADIOLOGIST EVAL & MGMT  07/22/2021   LIPOMA EXCISION     SHOULDER ARTHROSCOPY WITH SUBACROMIAL DECOMPRESSION Right 10/23/2021   Procedure: RIGHT SHOULDER ARTHROSCOPY WITH DEBRIDEMENT, SUBACROMIAL DECOMPRESSION, MANIPULATION UNDER ANESTHESIA;  Surgeon: Kathryne Hitch, MD;  Location: [facility];  Service: Orthopedics;  Laterality: Right;   Patient Active Problem List   Diagnosis Date Noted   Benign prostatic hyperplasia with nocturia 06/11/2022   Arthrofibrosis of right shoulder 10/23/2021   Aneurysm, cerebral,  nonruptured 08/13/2021   Status post stroke due to cerebrovascular disease    Cerebral edema (HCC) 04/29/2021   Stroke (cerebrum) (HCC) 04/29/2021   Right sided weakness    Acute CVA (cerebrovascular accident) (HCC) 04/27/2021   Primary osteoarthritis of left knee 01/13/2019   Primary osteoarthritis of right knee 01/13/2019   Flat foot 11/28/2017   Tendonitis of foot 11/28/2017   Polycythemia 05/12/2017   Vitamin D deficiency 04/21/2017   Bony sclerosis 08/05/2016   Lung nodule    Chronic pain of both knees 12/05/2015   Gastroesophageal reflux disease without esophagitis 05/17/2014   Essential hypertension 05/17/2014   Coronary artery calcification 08/17/2012   Abnormal LFTs (liver function tests) 07/11/2012   Chronic abdominal pain 06/24/2012   Right-sided chest wall pain 06/24/2012   Constipation, chronic 06/24/2012   Hyperbilirubinemia 06/24/2012   Chronic back pain 06/24/2012   DDD (degenerative disc disease), thoracolumbar 06/24/2012   History of nephrolithiasis 06/24/2012   Side pain 06/24/2012   Low back pain at multiple sites 06/24/2012    PCP: [name], MD  REFERRING PROVIDER: Kathryne Hitch, MD  REFERRING DIAG: M17.0 (ICD-10-CM) - Bilateral primary osteoarthritis of knee   THERAPY DIAG:  Chronic pain of right knee  Chronic pain of left knee  Muscle weakness (generalized)  Other abnormalities of gait and mobility  Rationale for Evaluation and Treatment: Rehabilitation  ONSET DATE: Chronic  SUBJECTIVE:   SUBJECTIVE  STATEMENT: Pt presents to PT with continued severe pain. Has been fairly compliant with HEP per report.   PERTINENT HISTORY: CVA (R Sided Weakness), HTN  PAIN:  Are you having pain?  Yes: NPRS scale: 8/10 Worst: 10/10 Pain location: bilateral knees (lateral L knee, posterior R knee) Pain description: sharp Aggravating factors: transfers, ambulating Relieving factors: none  PRECAUTIONS: None  WEIGHT BEARING  RESTRICTIONS: No  OBJECTIVE:   VITALS:   BP: 120/80  PATIENT SURVEYS:  FOTO: 39% function; 55% predicted  LOWER EXTREMITY ROM:  Active ROM Right eval Left eval Right 07/22/22  Hip flexion     Hip extension     Hip abduction     Hip adduction     Hip internal rotation     Hip external rotation     Knee flexion 115 120 125  Knee extension 9 0 10  Ankle dorsiflexion     Ankle plantarflexion     Ankle inversion     Ankle eversion      (Blank rows = not tested)  LOWER EXTREMITY MMT:  MMT Right eval Left eval Right 07/22/22 Left 07/22/22  Hip flexion 2+/5 4/5 2+/5 4/5  Hip extension      Hip abduction 2+/5 4/5 3/5 4/5  Hip adduction      Hip internal rotation      Hip external rotation      Knee flexion 3/5 4/5 3/5 4/5  Knee extension 2+/5 4/5 3/5 4/5  Ankle dorsiflexion      Ankle plantarflexion      Ankle inversion      Ankle eversion       (Blank rows = not tested)  FUNCTIONAL TESTS:  30 Second Sit to Stand: 1 rep - increased pain; attempted two more times but had LoB (on eval)  TREATMENT: [facility] Adult PT Treatment:                                                DATE: 07/22/22 Therapeutic Exercise: STS x 5 - with UE support Seated clamshell 2x15 GTB Seated march 2x20 GTB LAQ 3x10 Seated ball squeeze 2x10  Seated hamstring stretch x 30" each Supine quad set into towel x 10 - 5" hold each Supine SLR x 10 each (R difficult) Bridge x 10  Therapeutic Activity: Assessment of tests/measures, goals, and outcomes for discharge  [facility] Adult PT Treatment:                                                DATE: 07/20/22 Therapeutic Exercise: LAQ 15/15 3# over 1/2 roll for leverage Seated march 3# 15/15 Seated ball squeeze w/LAQs 15x Seated heel slides over towel 3# 15/15 Supine SLR 15x B SAQ 3# 15/15 Supine clamshell GTB 15x B, 15/15 unilaterally Bridge against GTB 15x Supine march 15/15 3#   [facility] Adult PT Treatment:                                                 DATE: 07/15/22 Therapeutic Exercise: LAQ 2x10 2# Seated march 2x20 2# Seated ball squeeze 2 x  10  LAQ with add 2x10 Seated hamstring curl 2x10 GTB Supine quad set into towel x 10 - 5" hold each Supine SLR 2x10 each (R difficult) SAQ 2x10 (R difficult) Supine clamshell 2x10 GTB Bridge 3x10  Supine hip adduction 2x10 Supine march 2x20 GTB   PATIENT EDUCATION:  Education details: continue HEP Person educated: Patient Education method:  N/A Education comprehension:  N/A  HOME EXERCISE PROGRAM: Access Code: [email or web address] URL: [email or web address] Date: 07/22/2022 Prepared by: Edwinna Areola  Exercises - Sit to Stand with Armchair  - 1 x daily - 7 x weekly - 2 sets - 5 reps - Seated Long Arc Quad  - 1 x daily - 7 x weekly - 2 sets - 10 reps - Seated Hamstring Stretch  - 1 x daily - 7 x weekly - 2-3 reps - 30 seconds hold - Seated Hip Abduction with Resistance  - 1 x daily - 7 x weekly - 3 sets - 15 reps - green band hold - Seated March with Resistance  - 1 x daily - 7 x weekly - 3 sets - 10 reps - green band hold - Seated Hip Adduction Squeeze with Ball  - 1 x daily - 7 x weekly - 2 sets - 10 reps - 5 sec hold - Supine Quad Set  - 1 x daily - 7 x weekly - 2 sets - 10 reps - 5 sec hold - Active Straight Leg Raise with Quad Set  - 1 x daily - 7 x weekly - 2 sets - 10 reps - Supine Bridge  - 1 x daily - 7 x weekly - 2 sets - 10 reps  ASSESSMENT:  CLINICAL IMPRESSION: Pt was able to complete prescribed exercises and demonstrated knowledge of HEP with only minimal pain during session. While he had made some improvement in strength and mobility, he has not had any change in pain or noted any subjective improvement. At this point he has maximized his rehab potential, PT went over a further HEP that should maintain his current level of function. Pt in agreement with current plan, will discharge a this time.   OBJECTIVE IMPAIRMENTS: Abnormal gait, decreased activity  tolerance, decreased mobility, difficulty walking, decreased ROM, decreased strength, and pain.   ACTIVITY LIMITATIONS: carrying, lifting, bending, standing, squatting, stairs, transfers, reach over head, and locomotion level  PARTICIPATION LIMITATIONS: meal prep, cleaning, laundry, driving, shopping, community activity, and yard work  PERSONAL FACTORS: Time since onset of injury/illness/exacerbation and 3+ comorbidities: CVA (R Sided Weakness), HTN  are also affecting patient's functional outcome.    GOALS: Goals reviewed with patient? No  SHORT TERM GOALS: Target date: 07/14/2022   Pt will be compliant and knowledgeable with initial HEP for improved comfort and carryover Baseline: initial HEP given  Goal status: MET Pt reports adherence 07/15/22  2.  Pt will self report bilateral knee pain no greater than 6/10 for improved comfort and functional ability Baseline: 10/10 at worst Goal status: MET   LONG TERM GOALS: Target date: 08/18/2022   Pt will improve FOTO function score to no less than 55% as proxy for functional improvement Baseline: 39% function 07/22/2022: 39% function Goal status: NOT MET   2.  Pt will self report bilateral knee pain no greater than 3/10 for improved comfort and functional ability Baseline: 10/10 at worst Goal status: NOT MET   3.  Pt will increase 30 Second Sit to Stand rep count to no less than 3 reps for improved balance, strength, and functional  mobility Baseline: 1 reps  07/22/2022: 3 reps  Goal status: MET   4.  Pt will improve R knee AROM to range of 3-115 degrees for improved comfort and functional mobility Baseline: see ROM chart 07/22/2022: see ROM chart (flexion met, ext unchanged) Goal status: PARTIALLY MET   PLAN:  PT FREQUENCY: 1-2x/week  PT DURATION: 8 weeks  PLANNED INTERVENTIONS: Therapeutic exercises, Therapeutic activity, Neuromuscular re-education, Balance training, Gait training, Patient/Family education, Self Care, and Joint  mobilization  PLAN FOR NEXT SESSION: assess HEP response, LE strengthening, gait   Eloy End, PT 07/22/2022, 11:32 AM

## 2022-07-30 DIAGNOSIS — R350 Frequency of micturition: Secondary | ICD-10-CM | POA: Diagnosis not present

## 2022-08-11 ENCOUNTER — Other Ambulatory Visit: Payer: Self-pay

## 2022-08-11 DIAGNOSIS — R42 Dizziness and giddiness: Secondary | ICD-10-CM | POA: Diagnosis not present

## 2022-08-11 DIAGNOSIS — H9203 Otalgia, bilateral: Secondary | ICD-10-CM | POA: Diagnosis not present

## 2022-08-11 DIAGNOSIS — H93293 Other abnormal auditory perceptions, bilateral: Secondary | ICD-10-CM | POA: Diagnosis not present

## 2022-08-11 DIAGNOSIS — Z8673 Personal history of transient ischemic attack (TIA), and cerebral infarction without residual deficits: Secondary | ICD-10-CM | POA: Diagnosis not present

## 2022-08-11 MED ORDER — MIRABEGRON ER 50 MG PO TB24
50.0000 mg | ORAL_TABLET | Freq: Every day | ORAL | 11 refills | Status: AC
  Filled 2022-08-11: qty 30, 30d supply, fill #0

## 2022-08-12 ENCOUNTER — Other Ambulatory Visit: Payer: Self-pay

## 2022-08-12 DIAGNOSIS — R350 Frequency of micturition: Secondary | ICD-10-CM | POA: Diagnosis not present

## 2022-08-12 MED ORDER — TAMSULOSIN HCL 0.4 MG PO CAPS: 0.4000 mg | ORAL_CAPSULE | Freq: Every day | ORAL | 3 refills | Status: DC

## 2022-08-12 MED ORDER — MIRABEGRON ER 50 MG PO TB24
50.0000 mg | ORAL_TABLET | Freq: Every day | ORAL | 3 refills | Status: AC
  Filled 2022-08-18: qty 90, 90d supply, fill #0

## 2022-08-14 ENCOUNTER — Ambulatory Visit: Payer: Self-pay | Admitting: *Deleted

## 2022-08-14 NOTE — Telephone Encounter (Signed)
Pt has been called and scheduled an appointment.

## 2022-08-14 NOTE — Telephone Encounter (Signed)
Summary: swollen feet   Pt called in both feet are swollen       Chief Complaint: bilateral feet swelling , requesting medication  Symptoms: bilateral feet swelling, redness reported. Patient reported swelling up to knees and then stated "its my feet". Swelling comes and goes x 1 year. Shoes not comfortable  Frequency: on and off x 1 year.  Pertinent Negatives: Patient denies chest pain no difficulty breathing no fever, no pain in calf Disposition: [] ED /[] Urgent Care (no appt availability in office) / [] Appointment(In office/virtual)/ []  [facility]/ [] Home Care/ [x] Refused Recommended Disposition /[] [facility] [facility]/ []  Follow-up with PCP Additional Notes:   No available appt within a week with PCP. Recommended UC if sx worsen. Patient reports he only wants to see PCP. Please advise. On waitlist. Would like a call back.      Reason for Disposition  [1] MILD swelling of both ankles (i.e., pedal edema) AND [2] is a chronic symptom (recurrent or ongoing AND present > 4 weeks)  Answer Assessment - Initial Assessment Questions 1. ONSET: "When did the swelling start?" (e.g., minutes, hours, days)     Over 1 year ago and comes and goes  2. LOCATION: "What part of the leg is swollen?"  "Are both legs swollen or just one leg?"     Bilateral feet  and reports up to knees but mostly feet  3. SEVERITY: "How bad is the swelling?" (e.g., localized; mild, moderate, severe)   - Localized: Small area of swelling localized to one leg.   - MILD pedal edema: Swelling limited to foot and ankle, pitting edema < 1/4 inch (6 mm) deep, rest and elevation eliminate most or all swelling.   - MODERATE edema: Swelling of lower leg to knee, pitting edema > 1/4 inch (6 mm) deep, rest and elevation only partially reduce swelling.   - SEVERE edema: Swelling extends above knee, facial or hand swelling present.      Difficulty wearing regular shoes 4. REDNESS: "Does the swelling look red or  infected?"     Redness  in feet  5. PAIN: "Is the swelling painful to touch?" If Yes, ask: "How painful is it?"   (Scale 1-10; mild, moderate or severe)     No  6. FEVER: "Do you have a fever?" If Yes, ask: "What is it, how was it measured, and when did it start?"      No  7. CAUSE: "What do you think is causing the leg swelling?"     Not sure hx stroke right sided weakness 8. MEDICAL HISTORY: "Do you have a history of blood clots (e.g., DVT), cancer, heart failure, kidney disease, or liver failure?"     Hx stroke  9. RECURRENT SYMPTOM: "Have you had leg swelling before?" If Yes, ask: "When was the last time?" "What happened that time?"     Yes comes and goes x 1 year 10. OTHER SYMPTOMS: "Do you have any other symptoms?" (e.g., chest pain, difficulty breathing)       no 11. PREGNANCY: "Is there any chance you are pregnant?" "When was your last menstrual period?"       Na  Protocols used: Leg Swelling and Edema-A-AH

## 2022-08-18 ENCOUNTER — Other Ambulatory Visit: Payer: Self-pay

## 2022-08-18 ENCOUNTER — Encounter: Payer: Self-pay | Admitting: Physician Assistant

## 2022-08-18 ENCOUNTER — Ambulatory Visit (INDEPENDENT_AMBULATORY_CARE_PROVIDER_SITE_OTHER): Payer: 59 | Admitting: Physician Assistant

## 2022-08-18 DIAGNOSIS — M170 Bilateral primary osteoarthritis of knee: Secondary | ICD-10-CM

## 2022-08-18 DIAGNOSIS — M1711 Unilateral primary osteoarthritis, right knee: Secondary | ICD-10-CM | POA: Diagnosis not present

## 2022-08-18 NOTE — Progress Notes (Signed)
HPI: [name] returns today due to bilateral knee pain.  He states the injections really do not give him any relief.  Given radiographs show some mild patellofemoral changes.  He does not feel the control and formal therapy was beneficial.  He does ambulate with short distances with a cane.  States that his right leg gives way on him.  Right hemiparesis secondary to stroke.  He notes that the right knee gives way on him.  He has pain in both knees even when seated.  Review of systems see HPI otherwise negative  Physical exam: General well-developed well-nourished male sitting wheelchair in no acute distress. Bilateral lower extremities he has quad atrophy bilaterally.  Passive range of motion bilateral knees reveals significant patellofemoral crepitus bilaterally.  No abnormal warmth erythema or effusion of either knee.  Atrophy bilateral gastrocs.  Calves are supple and nontender.  Impression: Quad atrophy bilaterally Bilateral knee pain  Plan: Recommend he work on quad strengthening exercises were shown.  He did ask about a hinged brace only provided him with this today.  Also he is written for a rolling walker with right platform attachment.  He will follow-up with [place] on an as-needed basis pain persist or becomes worse.  Did offer him formal therapy deferred today.

## 2022-08-25 ENCOUNTER — Encounter: Payer: Self-pay | Admitting: Internal Medicine

## 2022-08-25 ENCOUNTER — Ambulatory Visit: Payer: 59 | Attending: Family Medicine | Admitting: Family Medicine

## 2022-08-25 ENCOUNTER — Other Ambulatory Visit: Payer: Self-pay

## 2022-08-25 ENCOUNTER — Encounter: Payer: Self-pay | Admitting: Family Medicine

## 2022-08-25 VITALS — BP 118/79 | HR 80 | Ht 70.0 in

## 2022-08-25 DIAGNOSIS — R351 Nocturia: Secondary | ICD-10-CM | POA: Diagnosis not present

## 2022-08-25 DIAGNOSIS — N401 Enlarged prostate with lower urinary tract symptoms: Secondary | ICD-10-CM

## 2022-08-25 DIAGNOSIS — I69351 Hemiplegia and hemiparesis following cerebral infarction affecting right dominant side: Secondary | ICD-10-CM | POA: Diagnosis not present

## 2022-08-25 DIAGNOSIS — R600 Localized edema: Secondary | ICD-10-CM | POA: Diagnosis not present

## 2022-08-25 DIAGNOSIS — M62838 Other muscle spasm: Secondary | ICD-10-CM | POA: Diagnosis not present

## 2022-08-25 DIAGNOSIS — Z8673 Personal history of transient ischemic attack (TIA), and cerebral infarction without residual deficits: Secondary | ICD-10-CM

## 2022-08-25 DIAGNOSIS — I10 Essential (primary) hypertension: Secondary | ICD-10-CM

## 2022-08-25 MED ORDER — ASPIRIN 81 MG PO TBEC
81.0000 mg | DELAYED_RELEASE_TABLET | Freq: Every day | ORAL | 1 refills | Status: AC
Start: 2022-08-25 — End: ?
  Filled 2022-08-25: qty 120, 120d supply, fill #0

## 2022-08-25 MED ORDER — AMLODIPINE BESYLATE 5 MG PO TABS
5.0000 mg | ORAL_TABLET | Freq: Every day | ORAL | 1 refills | Status: AC
Start: 2022-08-25 — End: ?
  Filled 2022-08-25 – 2022-08-31 (×3): qty 90, 90d supply, fill #0

## 2022-08-25 MED ORDER — TAMSULOSIN HCL 0.4 MG PO CAPS
0.4000 mg | ORAL_CAPSULE | Freq: Every day | ORAL | 1 refills | Status: AC
  Filled 2022-08-25: qty 90, 90d supply, fill #0

## 2022-08-25 MED ORDER — METHOCARBAMOL 500 MG PO TABS
500.0000 mg | ORAL_TABLET | Freq: Two times a day (BID) | ORAL | 1 refills | Status: AC
  Filled 2022-08-25: qty 180, 90d supply, fill #0

## 2022-08-25 NOTE — Progress Notes (Signed)
Subjective:  Patient ID: Aaron Dennis, male    DOB: 1955-06-17  Age: 67 y.o. MRN: [id number]  CC: Foot Swelling   HPI Aaron Dennis is a 67 y.o. year old male with a history of hypertension, polycythemia (s/p phlebotomy in the past), chronic thoracolumbar pain, GERD, CVA of L MCA in 04/2021.  Interval History: Discussed the use of AI scribe software for clinical note transcription with the patient, who gave verbal consent to proceed.  The patient's chief complaint is bilateral ankle swelling, which has been present since the last visit. The swelling is more noticeable when the patient is not moving and improves with leg elevation. The patient denies any associated shortness of breath or difficulty breathing during sleep.  The patient also discusses his current medications, including amlodipine for hypertension, aspirin, and two muscle relaxants, baclofen and methocarbamol. There is some confusion about whether the patient is taking both muscle relaxants,  The patient also mentions Myrbetriq prescribed by a urologist . In addition, the patient mentions his wife's recent back surgeries and ongoing hospitalization. The patient's wife has been in the hospital for two weeks and is starting rehabilitation.        Past Medical History:  Diagnosis Date   Constipation    Depression    GERD (gastroesophageal reflux disease)    Hypertension    Lung nodule    7 mm LUL nodule   Polycythemia    Stroke (HCC) 05/01/2021   acute left frontoparietal infarct, residual right-sided hemiparesis   Vertebral artery aneurysm (HCC)    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   Vitamin D deficiency     Past Surgical History:  Procedure Laterality Date   IR RADIOLOGIST EVAL & MGMT  07/02/2021   IR RADIOLOGIST EVAL & MGMT  07/22/2021   LIPOMA EXCISION     SHOULDER ARTHROSCOPY WITH SUBACROMIAL DECOMPRESSION Right 10/23/2021   Procedure: RIGHT SHOULDER ARTHROSCOPY WITH DEBRIDEMENT, SUBACROMIAL  DECOMPRESSION, MANIPULATION UNDER ANESTHESIA;  Surgeon: Kathryne Hitch, MD;  Location: [facility];  Service: Orthopedics;  Laterality: Right;    Family History  Problem Relation Age of Onset   Healthy Mother    Diabetes Brother    Colon polyps Neg Hx    Colon cancer Neg Hx    Esophageal cancer Neg Hx    Rectal cancer Neg Hx    Stomach cancer Neg Hx     Social History   Socioeconomic History   Marital status: Married    Spouse name: Not on file   Number of children: Not on file   Years of education: Not on file   Highest education level: Not on file  Occupational History   Not on file  Tobacco Use   Smoking status: Former   Smokeless tobacco: Never   Tobacco comments:    quit Jan 2018  Vaping Use   Vaping status: Never Used  Substance and Sexual Activity   Alcohol use: No   Drug use: No   Sexual activity: Not Currently  Other Topics Concern   Not on file  Social History Narrative   Not on file   Social Determinants of Health   Financial Resource Strain: Low Risk  (06/24/2022)   Overall Financial Resource Strain (CARDIA)    Difficulty of Paying Living Expenses: Not hard at all  Food Insecurity: No Food Insecurity (06/24/2022)   Hunger Vital Sign    Worried About Running Out of Food in the Last Year: Never true  Ran Out of Food in the Last Year: Never true  Transportation Needs: No Transportation Needs (06/24/2022)   PRAPARE - Administrator, Civil Service (Medical): No    Lack of Transportation (Non-Medical): No  Physical Activity: Inactive (06/24/2022)   Exercise Vital Sign    Days of Exercise per Week: 0 days    Minutes of Exercise per Session: 0 min  Stress: No Stress Concern Present (06/24/2022)   [employer] of Occupational Health - Occupational Stress Questionnaire    Feeling of Stress : Not at all  Social Connections: Moderately Isolated (06/24/2022)   Social Connection and Isolation Panel [NHANES]    Frequency of Communication with  Friends and Family: More than three times a week    Frequency of Social Gatherings with Friends and Family: Three times a week    Attends Religious Services: Never    Active Member of Clubs or Organizations: No    Attends Banker Meetings: Never    Marital Status: Married    Allergies  Allergen Reactions   Beef-Derived Products     Cultural preference   Fish-Derived Products     Does not eat fish   Pork-Derived Products Other (See Comments)    Cultural preference     Outpatient Medications Prior to Visit  Medication Sig Dispense Refill   acetaminophen (TYLENOL) 325 MG tablet Take 1-2 tablets (325-650 mg total) by mouth every 4 (four) hours as needed for mild pain.     citalopram (CELEXA) 20 MG tablet Take 1 tablet (20 mg total) by mouth daily. 90 tablet 1   mirabegron ER (MYRBETRIQ) 50 MG TB24 tablet Take 1 tablet (50 mg total) by mouth daily. 30 tablet 11   mirabegron ER (MYRBETRIQ) 50 MG TB24 tablet Take 1 tablet (50 mg total) by mouth daily. 90 tablet 3   omeprazole (PRILOSEC) 40 MG capsule Take 1 capsule (40 mg total) by mouth daily. 90 capsule 1   ondansetron (ZOFRAN) 4 MG tablet Take 1 tablet (4 mg total) by mouth every 6 (six) hours as needed for nausea or vomiting. 12 tablet 0   polyethylene glycol powder (GLYCOLAX/MIRALAX) 17 GM/SCOOP powder Take 17 grams by mouth daily. (Patient taking differently: Take 17 g by mouth daily as needed for moderate constipation.) 510 g 1   rosuvastatin (CRESTOR) 20 MG tablet Take 1 tablet (20 mg total) by mouth daily. 90 tablet 1   vitamin B-12 (CYANOCOBALAMIN) 1000 MCG tablet Take 1 tablet (1,000 mcg total) by mouth daily. 90 tablet 1   amLODipine (NORVASC) 5 MG tablet Take 1 tablet (5 mg total) by mouth daily. 90 tablet 1   aspirin 81 MG EC tablet Take 1 tablet (81 mg total) by mouth daily. Swallow whole. 90 tablet 1   baclofen (LIORESAL) 10 MG tablet Take 1 tablet (10 mg total) by mouth 3 (three) times daily. 270 tablet 1    HYDROcodone-acetaminophen (NORCO) 5-325 MG tablet Take 1 tablet by mouth every 6 (six) hours as needed for moderate pain. 30 tablet 0   methocarbamol (ROBAXIN) 500 MG tablet Take 1 tablet (500 mg total) by mouth 2 (two) times daily. 20 tablet 0   tamsulosin (FLOMAX) 0.4 MG CAPS capsule Take 1 capsule (0.4 mg total) by mouth daily. 90 capsule 3   No facility-administered medications prior to visit.     ROS Review of Systems  Constitutional:  Negative for activity change and appetite change.  HENT:  Negative for sinus pressure and sore throat.  Respiratory:  Negative for chest tightness, shortness of breath and wheezing.   Cardiovascular:  Positive for leg swelling. Negative for chest pain and palpitations.  Gastrointestinal:  Negative for abdominal distention, abdominal pain and constipation.  Genitourinary: Negative.   Musculoskeletal: Negative.   Neurological:  Positive for weakness.  Psychiatric/Behavioral:  Negative for behavioral problems and dysphoric mood.     Objective:  BP 118/79   Pulse 80   Ht 5\' 10"  (1.778 m)   SpO2 97%   BMI 24.39 kg/m      08/25/2022   10:22 AM 07/12/2022    5:13 PM 07/12/2022    2:14 PM  BP/Weight  Systolic BP 118 130   Diastolic BP 79 84   Wt. (Lbs)   169.97  BMI   24.39 kg/m2      Physical Exam Constitutional:      Appearance: He is well-developed.  Cardiovascular:     Rate and Rhythm: Normal rate.     Heart sounds: Normal heart sounds. No murmur heard. Pulmonary:     Effort: Pulmonary effort is normal.     Breath sounds: Normal breath sounds. No wheezing or rales.  Chest:     Chest wall: No tenderness.  Abdominal:     General: Bowel sounds are normal. There is no distension.     Palpations: Abdomen is soft. There is no mass.     Tenderness: There is no abdominal tenderness.  Musculoskeletal:     Right lower leg: Edema present.     Left lower leg: Edema present.     Comments: Right sided weakness  Neurological:     Mental  Status: He is alert and oriented to person, place, and time.  Psychiatric:        Mood and Affect: Mood normal.        Latest Ref Rng & Units 07/12/2022    2:16 PM 07/07/2022    3:47 PM 06/09/2022    2:55 PM  CMP  Glucose 70 - 99 mg/dL 098  119  147   BUN 8 - 23 mg/dL 20  10  14    Creatinine 0.61 - 1.24 mg/dL 8.29  5.62  1.30   Sodium 135 - 145 mmol/L 139  138  140   Potassium 3.5 - 5.1 mmol/L 3.5  3.8  3.7   Chloride 98 - 111 mmol/L 106  105  105   CO2 22 - 32 mmol/L 24  26  29    Calcium 8.9 - 10.3 mg/dL 9.3  9.1  9.7   Total Protein 6.5 - 8.1 g/dL 7.2  6.9  7.1   Total Bilirubin 0.3 - 1.2 mg/dL 1.1  1.6  1.3   Alkaline Phos 38 - 126 U/L 110  107  103   AST 15 - 41 U/L 15  14  14    ALT 0 - 44 U/L 11  9  10      Lipid Panel     Component Value Date/Time   CHOL 184 06/11/2022 0930   TRIG 92 06/11/2022 0930   HDL 35 (L) 06/11/2022 0930   CHOLHDL 5.5 04/27/2021 1649   VLDL 10 04/27/2021 1649   LDLCALC 132 (H) 06/11/2022 0930    CBC    Component Value Date/Time   WBC 8.6 07/12/2022 1416   RBC 5.59 07/12/2022 1416   HGB 14.5 07/12/2022 1416   HGB 15.2 06/09/2022 1455   HGB 16.3 10/07/2021 1024   HGB 16.5 03/13/2009 0931   HCT 44.4 07/12/2022 1416  HCT 49.6 10/07/2021 1024   HCT 49.8 03/13/2009 0931   PLT 237 07/12/2022 1416   PLT 230 06/09/2022 1455   PLT 226 10/07/2021 1024   MCV 79.4 (L) 07/12/2022 1416   MCV 83 10/07/2021 1024   MCV 82.2 03/13/2009 0931   MCH 25.9 (L) 07/12/2022 1416   MCHC 32.7 07/12/2022 1416   RDW 13.8 07/12/2022 1416   RDW 14.2 10/07/2021 1024   RDW 15.5 (H) 03/13/2009 0931   LYMPHSABS 1.1 07/07/2022 1547   LYMPHSABS 2.1 10/07/2021 1024   LYMPHSABS 2.2 03/13/2009 0931   MONOABS 0.5 07/07/2022 1547   MONOABS 0.5 03/13/2009 0931   EOSABS 0.0 07/07/2022 1547   EOSABS 0.1 10/07/2021 1024   BASOSABS 0.0 07/07/2022 1547   BASOSABS 0.1 10/07/2021 1024   BASOSABS 0.0 03/13/2009 0931    Lab Results  Component Value Date   HGBA1C 5.1  06/11/2022    Assessment & Plan:      Bilateral Ankle Edema: New onset, likely dependent edema. No symptoms of heart failure. Patient on amlodipine, which can cause edema, but has been on this medication for 5-6 years without prior issues. -Continue current management: low salt diet, leg elevation, and compression stockings. -Consider changing amlodipine to another antihypertensive if edema persists or worsens.  Right hemiparesis secondary to stroke: Risk factor modification -Confusion regarding use of two muscle relaxants (baclofen and methocarbamol).  He opts to continue with methocarbamol and baclofen has been discontinued -He will be traveling out of the country and when he returns I will refer him for PT  BPH: Stable -Continue Flomax -Contact urologist's office regarding insurance coverage for Myrbetriq as I received a problem stating Myrbetriq was not covered by his insurance.  Hypertension: Controlled -Continue amlodipine -Counseled on blood pressure goal of less than 130/80, low-sodium, DASH diet, medication compliance, 150 minutes of moderate intensity exercise per week. Discussed medication compliance, adverse effects.  Follow-up in three months or sooner if patient has concerns or if edema worsens.           Meds ordered this encounter  Medications   amLODipine (NORVASC) 5 MG tablet    Sig: Take 1 tablet (5 mg total) by mouth daily.    Dispense:  90 tablet    Refill:  1   aspirin EC 81 MG tablet    Sig: Take 1 tablet (81 mg total) by mouth daily. Swallow whole.    Dispense:  120 tablet    Refill:  1   methocarbamol (ROBAXIN) 500 MG tablet    Sig: Take 1 tablet (500 mg total) by mouth 2 (two) times daily.    Dispense:  180 tablet    Refill:  1   tamsulosin (FLOMAX) 0.4 MG CAPS capsule    Sig: Take 1 capsule (0.4 mg total) by mouth daily.    Dispense:  90 capsule    Refill:  1    Follow-up: Return in about 6 months (around 02/25/2023).       [name], MD, FAAFP. [facility] [place], [place] [phone]   08/25/2022, 1:16 PM

## 2022-08-25 NOTE — Progress Notes (Signed)
Swelling in feet 90-day supply on all refills.

## 2022-08-25 NOTE — Patient Instructions (Signed)
Edema  Edema is an abnormal buildup of fluids in the body tissues and under the skin. Swelling of the legs, feet, and ankles is a common symptom that becomes more likely as you get older. Swelling is also common in looser tissues, such as around the eyes. Pressing on the area may make a temporary dent in your skin (pitting edema). This fluid may also accumulate in your lungs (pulmonary edema). There are many possible causes of edema. Eating too much salt (sodium) and being on your feet or sitting for a long time can cause edema in your legs, feet, and ankles. Common causes of edema include: Certain medical conditions, such as heart failure, liver or kidney disease, and cancer. Weak leg blood vessels. An injury. Pregnancy. Medicines. Being obese. Low protein levels in the blood. Hot weather may make edema worse. Edema is usually painless. Your skin may look swollen or shiny. Follow these instructions at home: Medicines Take over-the-counter and prescription medicines only as told by your health care provider. Your health care provider may prescribe a medicine to help your body get rid of extra water (diuretic). Take this medicine if you are told to take it. Eating and drinking Eat a low-salt (low-sodium) diet to reduce fluid as told by your health care provider. Sometimes, eating less salt may reduce swelling. Depending on the cause of your swelling, you may need to limit how much fluid you drink (fluid restriction). General instructions Raise (elevate) the injured area above the level of your heart while you are sitting or lying down. Do not sit still or stand for long periods of time. Do not wear tight clothing. Do not wear garters on your upper legs. Exercise your legs to get your circulation going. This helps to move the fluid back into your blood vessels, and it may help the swelling go down. Wear compression stockings as told by your health care provider. These stockings help to prevent  blood clots and reduce swelling in your legs. It is important that these are the correct size. These stockings should be prescribed by your health care provider to prevent possible injuries. If elastic bandages or wraps are recommended, use them as told by your health care provider. Contact a health care provider if: Your edema does not get better with treatment. You have heart, liver, or kidney disease and have symptoms of edema. You have sudden and unexplained weight gain. Get help right away if: You develop shortness of breath or chest pain. You cannot breathe when you lie down. You develop pain, redness, or warmth in the swollen areas. You have heart, liver, or kidney disease and suddenly get edema. You have a fever and your symptoms suddenly get worse. These symptoms may be an emergency. Get help right away. Call 911. Do not wait to see if the symptoms will go away. Do not drive yourself to the hospital. Summary Edema is an abnormal buildup of fluids in the body tissues and under the skin. Eating too much salt (sodium)and being on your feet or sitting for a long time can cause edema in your legs, feet, and ankles. Raise (elevate) the injured area above the level of your heart while you are sitting or lying down. Follow your health care provider's instructions about diet and how much fluid you can drink. This information is not intended to replace advice given to you by your health care provider. Make sure you discuss any questions you have with your health care provider. Document Revised: 09/30/2020 Document   Reviewed: 09/30/2020 Elsevier Patient Education  2024 [employer] Inc.  

## 2022-08-27 ENCOUNTER — Other Ambulatory Visit: Payer: Self-pay

## 2022-08-28 ENCOUNTER — Other Ambulatory Visit: Payer: Self-pay

## 2022-08-28 MED ORDER — MIRABEGRON ER 50 MG PO TB24
50.0000 mg | ORAL_TABLET | Freq: Every day | ORAL | 3 refills | Status: AC
  Filled 2022-08-28: qty 90, 90d supply, fill #0

## 2022-08-31 ENCOUNTER — Other Ambulatory Visit: Payer: Self-pay

## 2022-09-01 ENCOUNTER — Other Ambulatory Visit: Payer: Self-pay

## 2022-09-21 ENCOUNTER — Other Ambulatory Visit: Payer: Self-pay | Admitting: Physician Assistant

## 2022-09-21 DIAGNOSIS — M25511 Pain in right shoulder: Secondary | ICD-10-CM

## 2022-09-28 ENCOUNTER — Ambulatory Visit: Payer: 59 | Admitting: Neurology

## 2023-01-26 ENCOUNTER — Encounter: Payer: Self-pay | Admitting: Internal Medicine

## 2023-02-25 ENCOUNTER — Ambulatory Visit: Payer: 59 | Admitting: Family Medicine

## 2023-04-30 IMAGING — CT CT BIOPSY
1 of 2 series · 15 of 30 positions shown, 19 images · non-contrast
Comparison: none

INDICATION: Polycythemia

[Series 2: i-spiral 5.0 br40 · axial · 0.79mm/px · z∈[+1244,+1380]mm · 15 of 45 slices shown, 19 images]
[im 3/45  mediastinal]
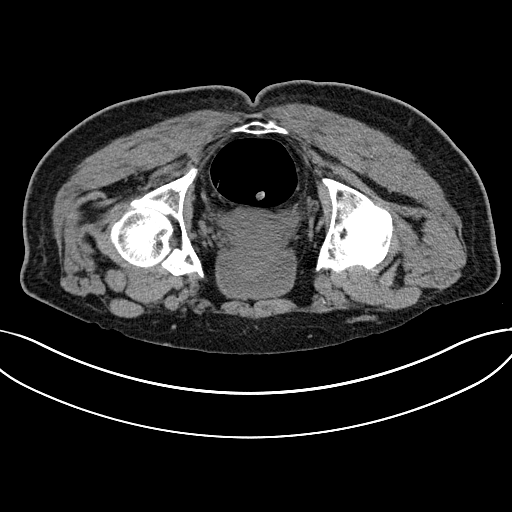
[im 3/45  lung]
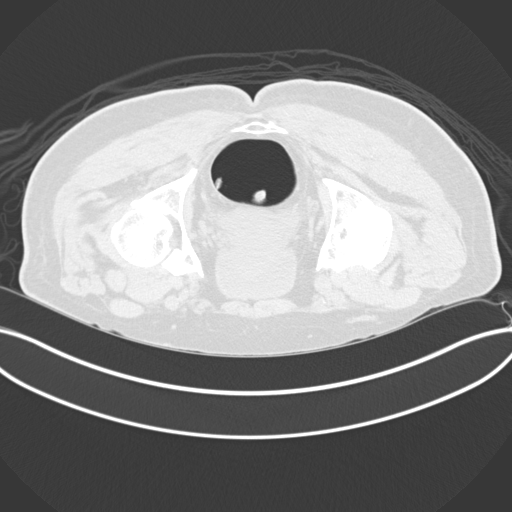
[im 7/45  lung]
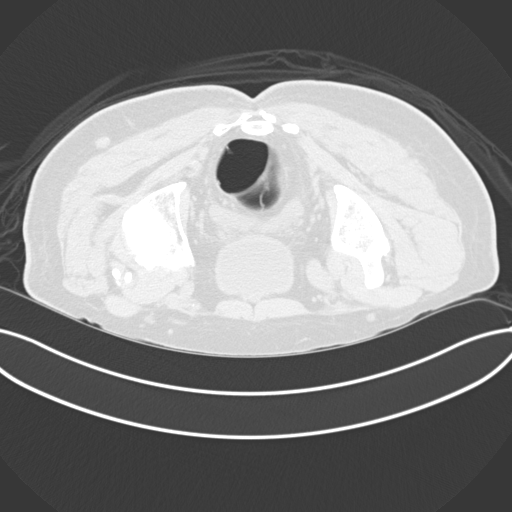
[im 9/45  lung]
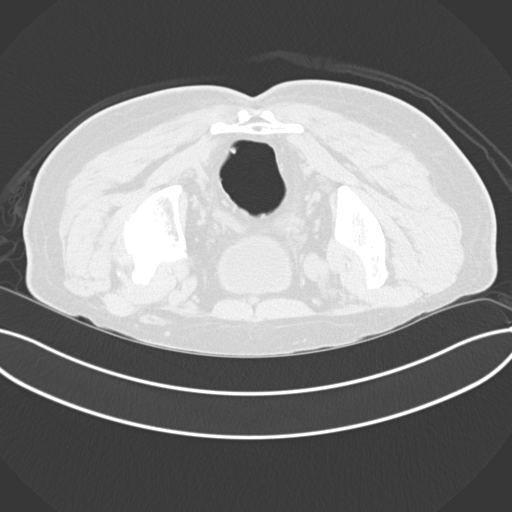
[im 12/45  lung]
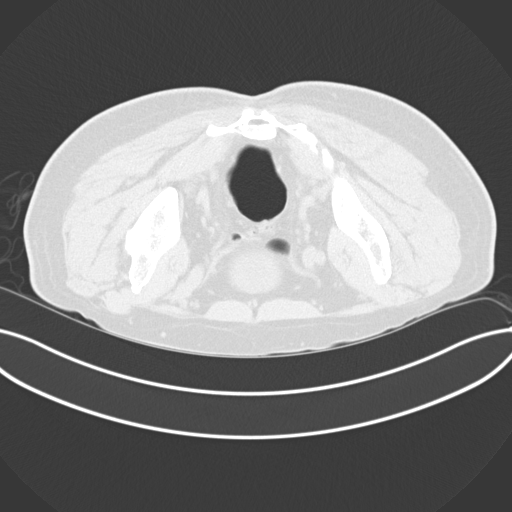
[im 13/45  mediastinal]
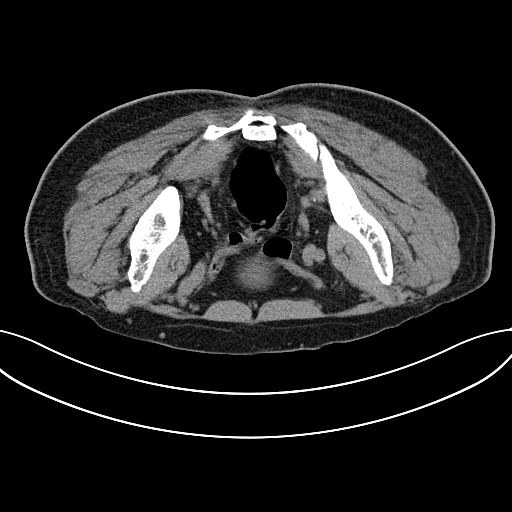
[im 13/45  lung]
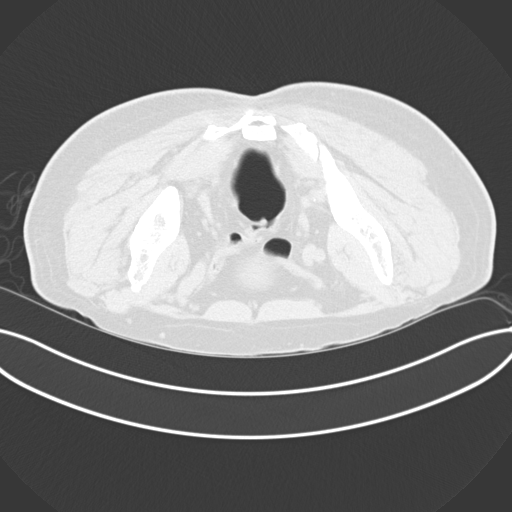
[im 17/45  lung]
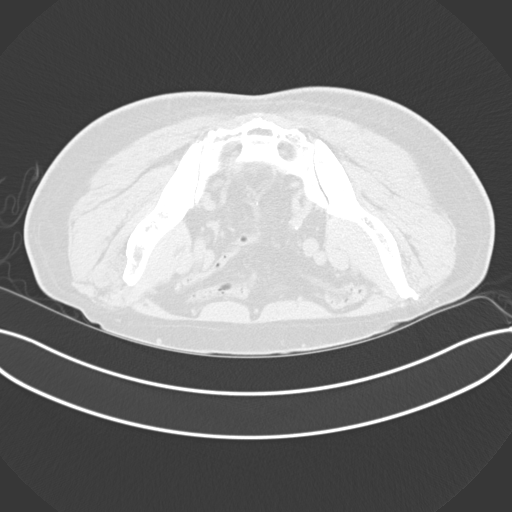
[im 19/45  lung]
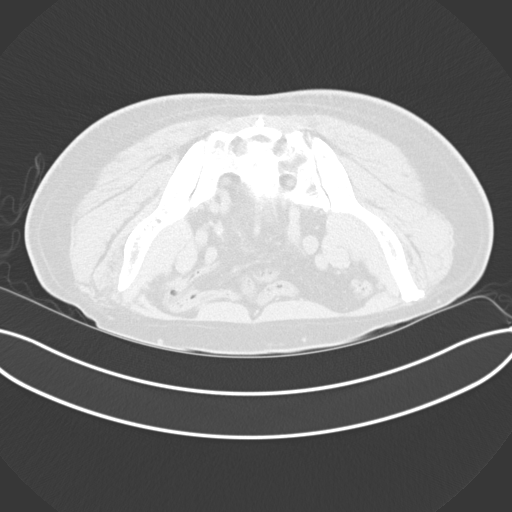
[im 23/45  lung]
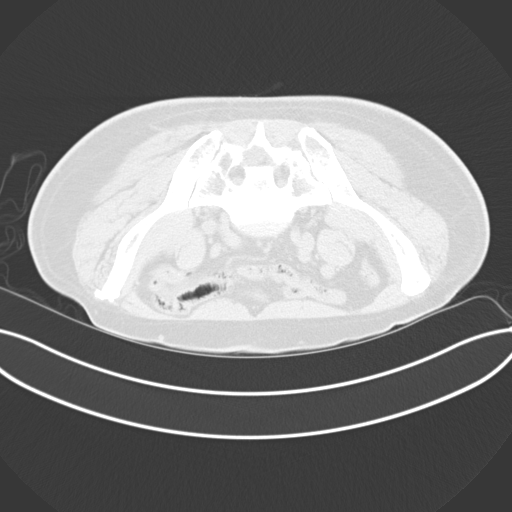
[im 24/45  mediastinal]
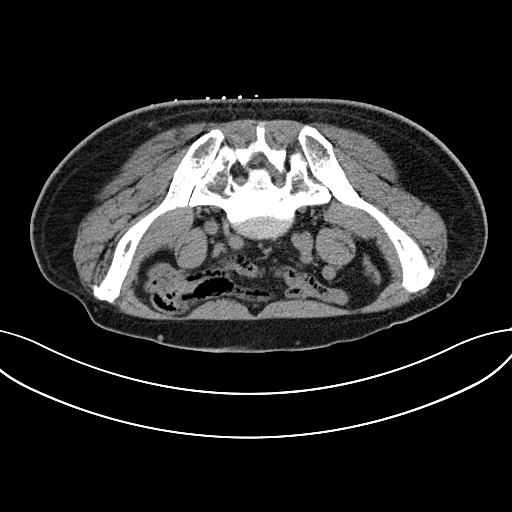
[im 24/45  lung]
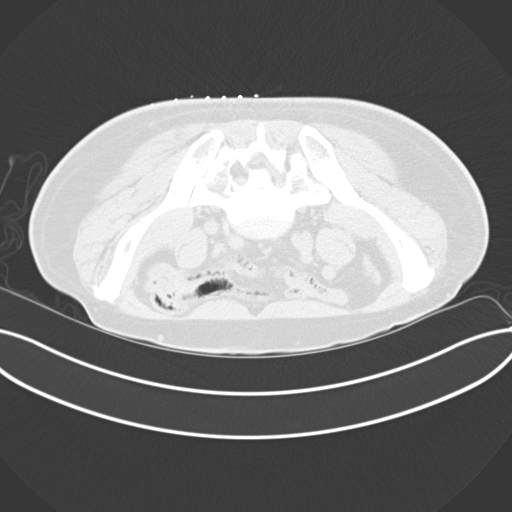
[im 26/45  lung]
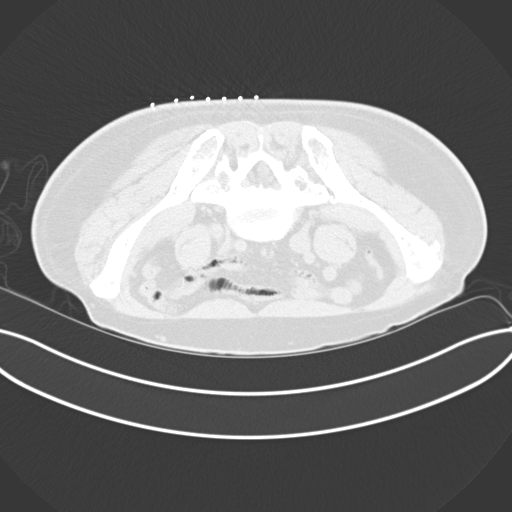
[im 30/45  lung]
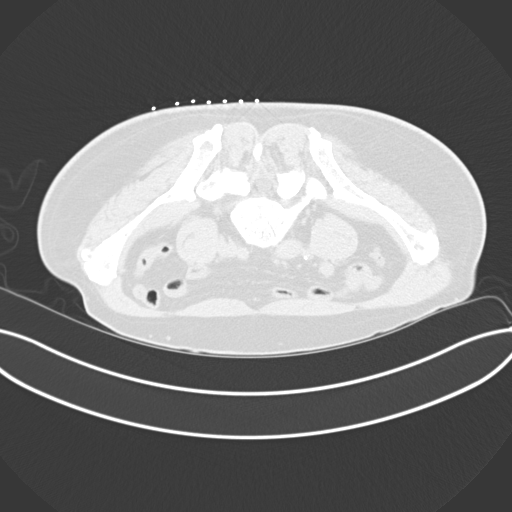
[im 32/45  lung]
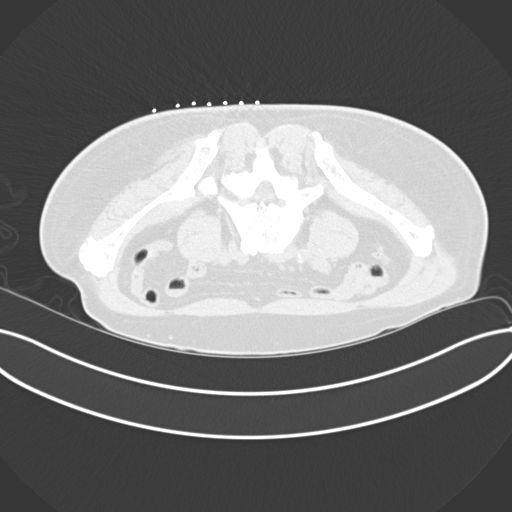
[im 36/45  mediastinal]
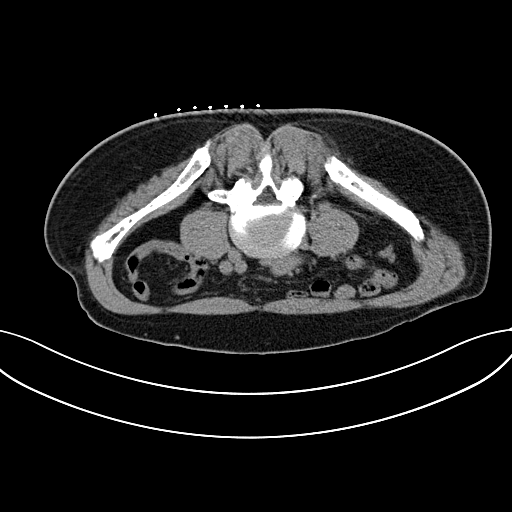
[im 36/45  lung]
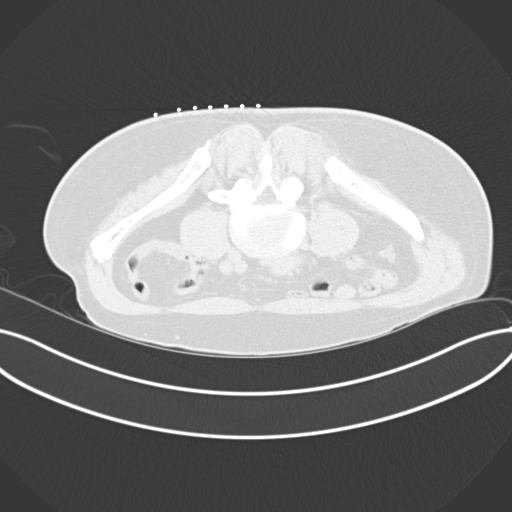
[im 38/45  lung]
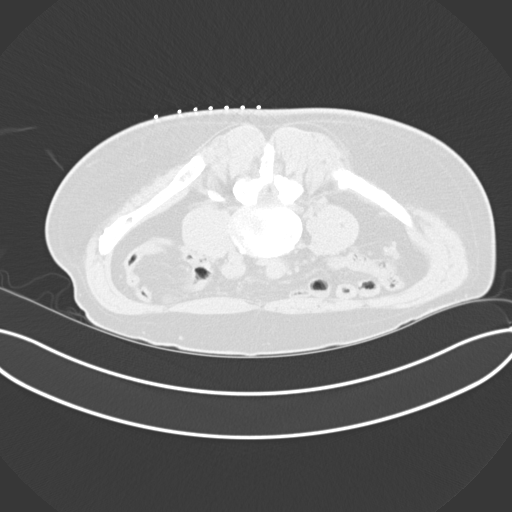
[im 42/45  lung]
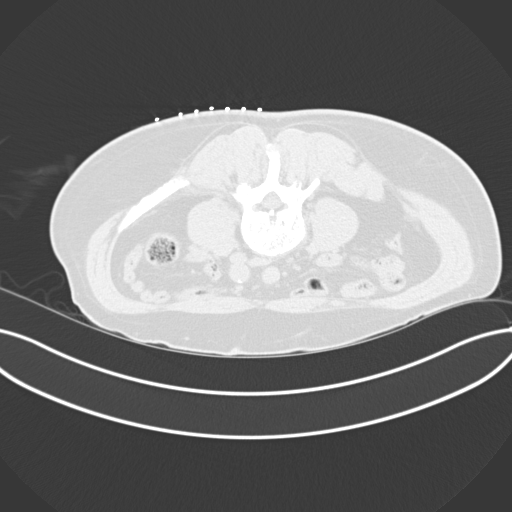

[15 of 30 positions shown; findings below may reference images not displayed]

EXAM:
CT GUIDED BONE MARROW ASPIRATION AND CORE BIOPSY

RADIATION DOSE REDUCTION: This exam was performed according to the
departmental dose-optimization program which includes automated
exposure control, adjustment of the mA and/or kV according to
patient size and/or use of iterative reconstruction technique.

MEDICATIONS:
None.

ANESTHESIA/SEDATION:
Moderate (conscious) sedation was employed during this procedure. A
total of Versed 2 mg and Fentanyl 100 mcg was administered
intravenously.

Moderate Sedation Time: 18 minutes. The patient's level of
consciousness and vital signs were monitored continuously by
radiology nursing throughout the procedure under my direct
supervision.

FLUOROSCOPY TIME:  CT dose in mGy was not provided.

COMPLICATIONS:
None immediate.

Estimated blood loss: <5 mL

PROCEDURE:
Informed written consent was obtained from the patient after a
thorough discussion of the procedural risks, benefits and
alternatives. All questions were addressed. Maximal Sterile Barrier
Technique was utilized including caps, mask, sterile gowns, sterile
gloves, sterile drape, hand hygiene and skin antiseptic. A timeout
was performed prior to the initiation of the procedure.

The patient was positioned prone and non-contrast localization CT
was performed of the pelvis to demonstrate the iliac marrow spaces.

Maximal barrier sterile technique utilized including caps, mask,
sterile gowns, sterile gloves, large sterile drape, hand hygiene,
and chlorhexidine prep.

Under sterile conditions and local anesthesia, an 11 gauge coaxial
bone biopsy needle was advanced into the RIGHT iliac marrow space.
Needle position was confirmed with CT imaging. Initially, bone
marrow aspiration was performed. Next, the 11 gauge outer cannula
was utilized to obtain a 1 iliac bone marrow core biopsy. Needle was
removed. Hemostasis was obtained with compression. The patient
tolerated the procedure well. Samples were prepared with the
cytotechnologist.
IMPRESSION: Successful CT-guided bone marrow aspiration and biopsy, as above.

## 2023-04-30 IMAGING — CT CT BIOPSY AND ASPIRATION BONE MARROW
1 of 2 series · 15 of 30 positions shown, 19 images · non-contrast
Comparison: none

INDICATION: Polycythemia

[Series 2: i-spiral 5.0 br40 · axial · 0.79mm/px · z∈[+1244,+1380]mm · 15 of 45 slices shown, 19 images]
[im 3/45  mediastinal]
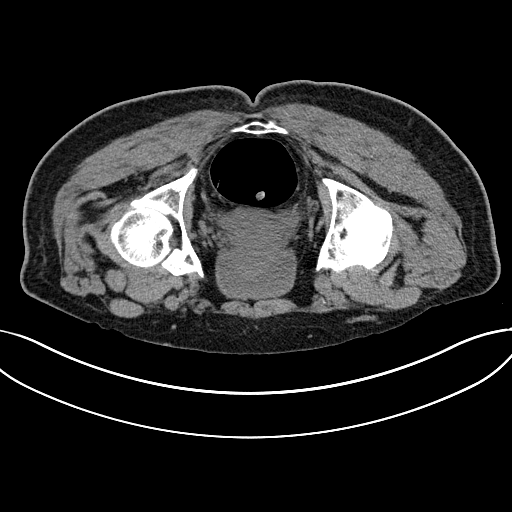
[im 3/45  lung]
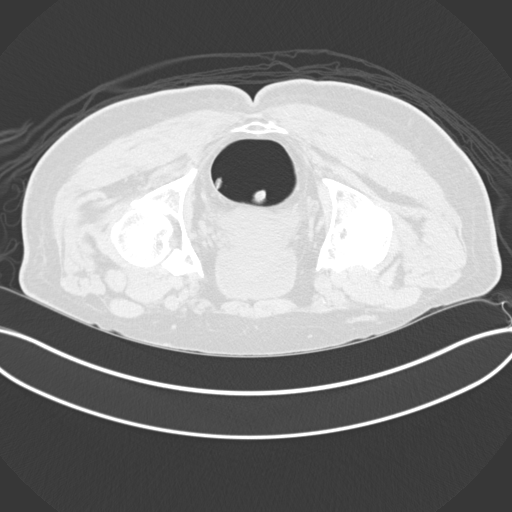
[im 7/45  lung]
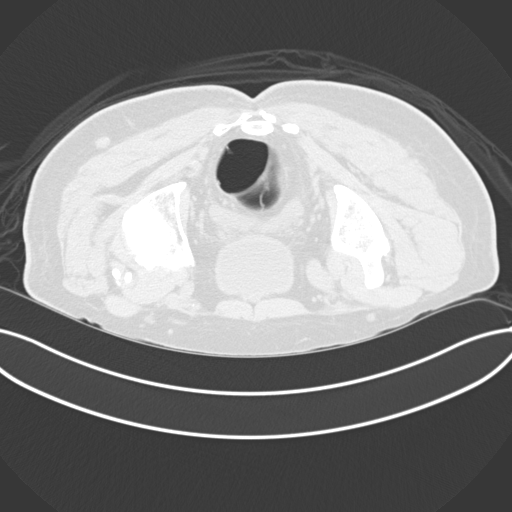
[im 9/45  lung]
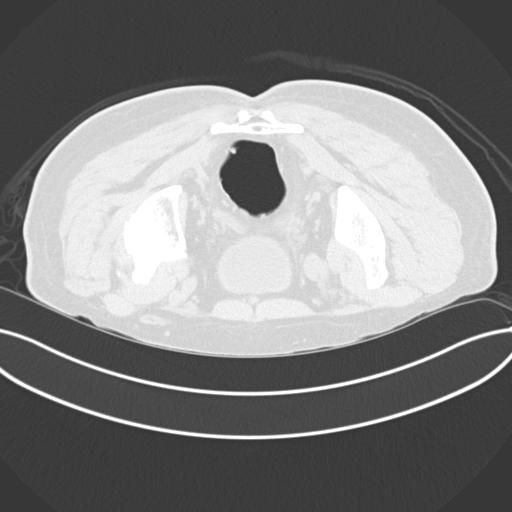
[im 12/45  lung]
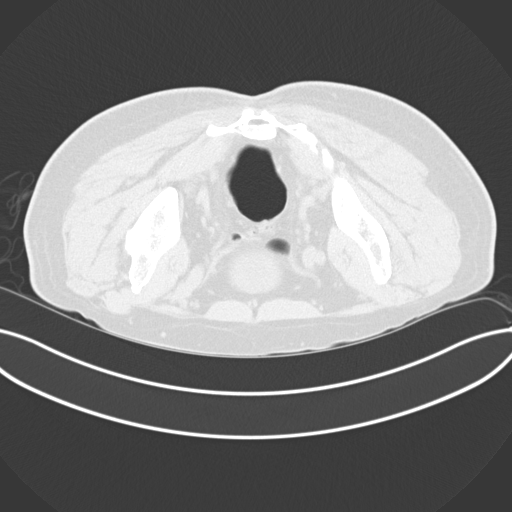
[im 13/45  mediastinal]
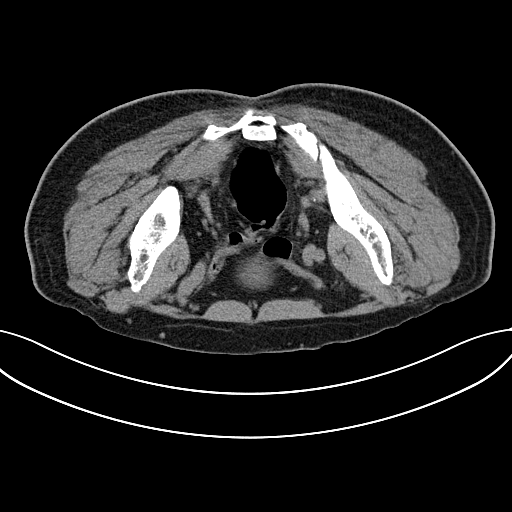
[im 13/45  lung]
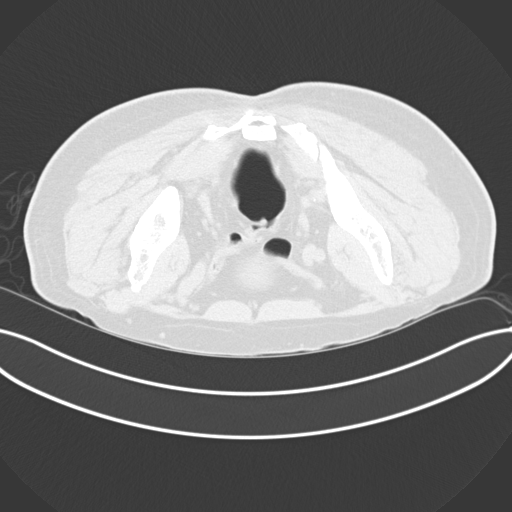
[im 17/45  lung]
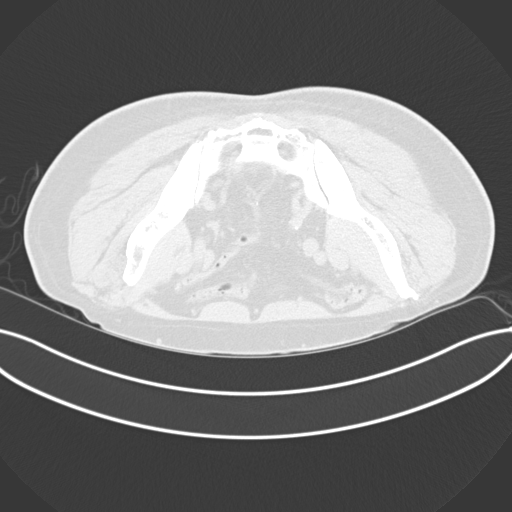
[im 19/45  lung]
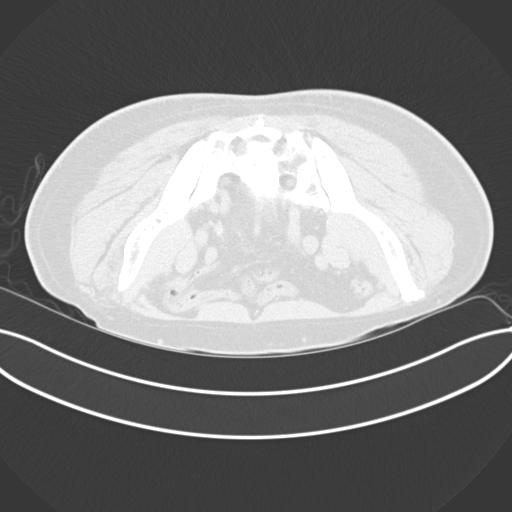
[im 23/45  lung]
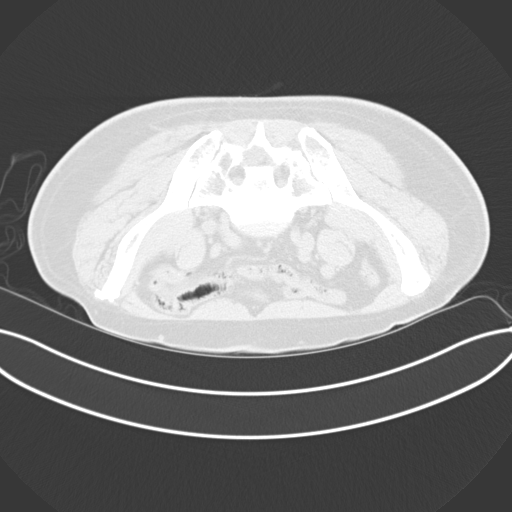
[im 24/45  mediastinal]
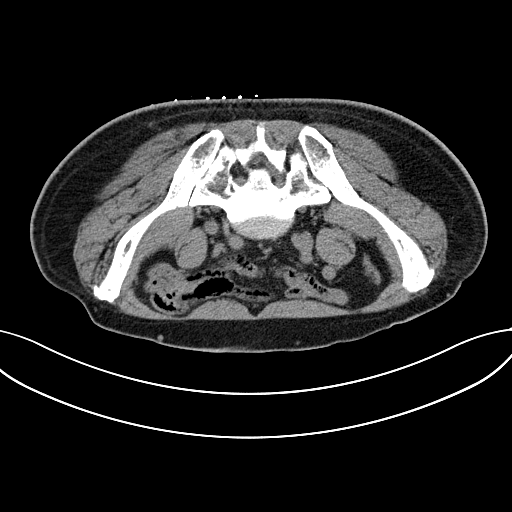
[im 24/45  lung]
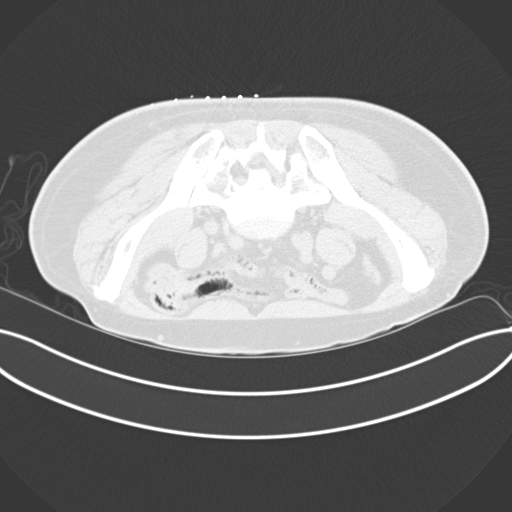
[im 26/45  lung]
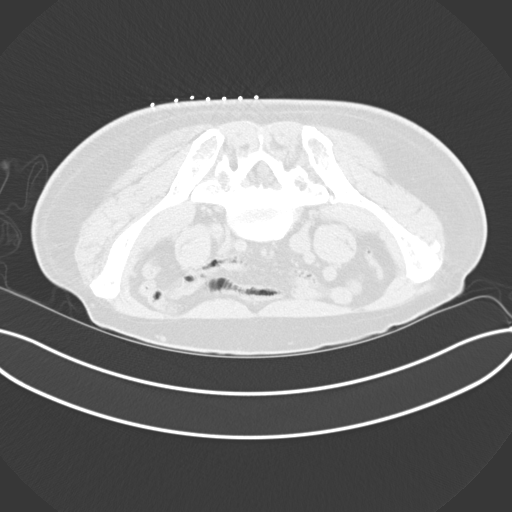
[im 30/45  lung]
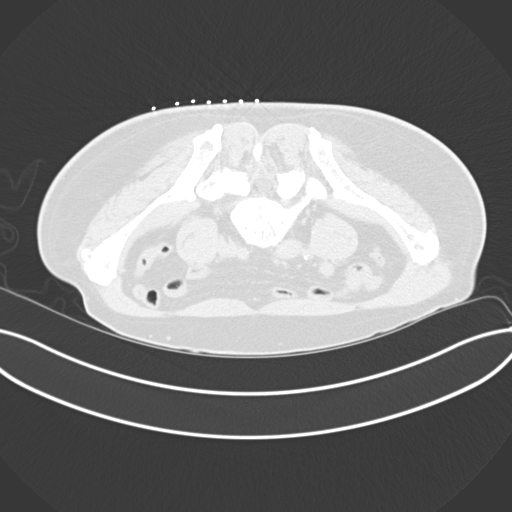
[im 32/45  lung]
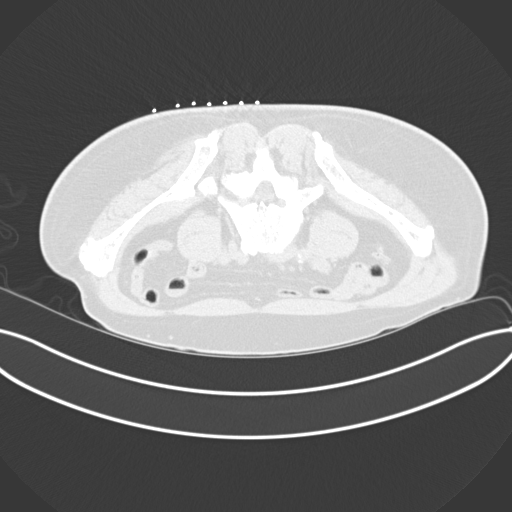
[im 36/45  mediastinal]
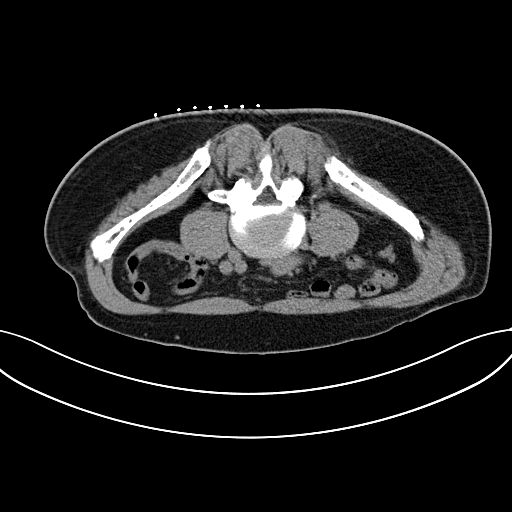
[im 36/45  lung]
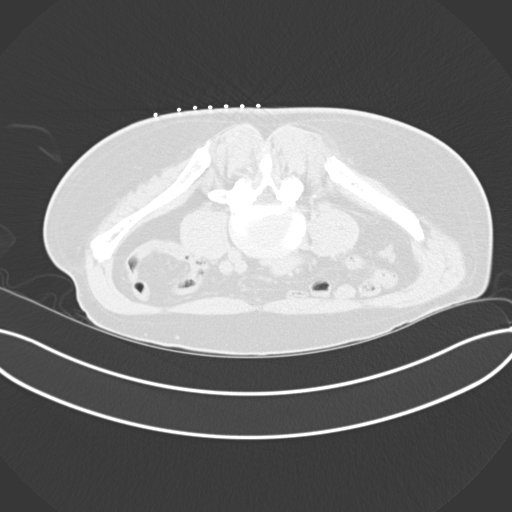
[im 38/45  lung]
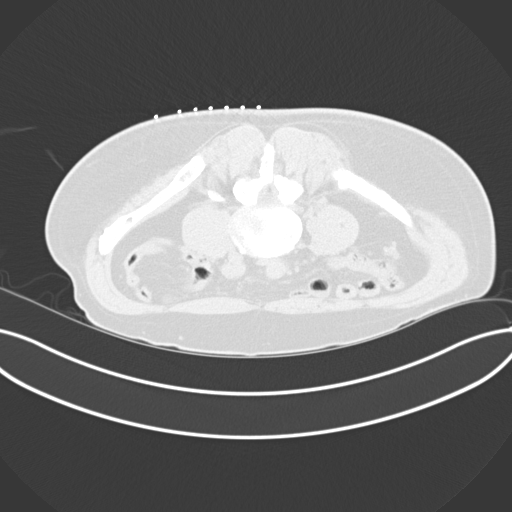
[im 42/45  lung]
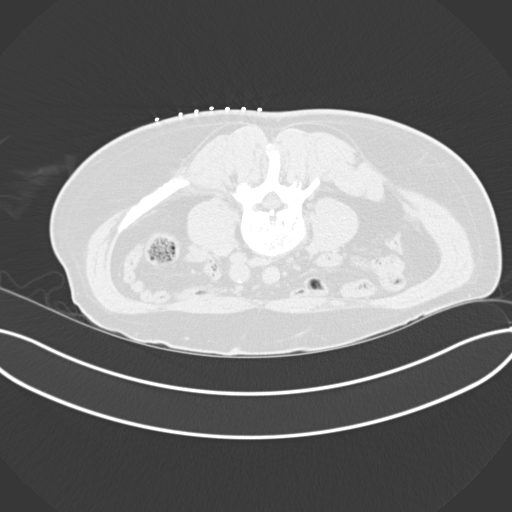

[15 of 30 positions shown; findings below may reference images not displayed]

EXAM:
CT GUIDED BONE MARROW ASPIRATION AND CORE BIOPSY

RADIATION DOSE REDUCTION: This exam was performed according to the
departmental dose-optimization program which includes automated
exposure control, adjustment of the mA and/or kV according to
patient size and/or use of iterative reconstruction technique.

MEDICATIONS:
None.

ANESTHESIA/SEDATION:
Moderate (conscious) sedation was employed during this procedure. A
total of Versed 2 mg and Fentanyl 100 mcg was administered
intravenously.

Moderate Sedation Time: 18 minutes. The patient's level of
consciousness and vital signs were monitored continuously by
radiology nursing throughout the procedure under my direct
supervision.

FLUOROSCOPY TIME:  CT dose in mGy was not provided.

COMPLICATIONS:
None immediate.

Estimated blood loss: <5 mL

PROCEDURE:
Informed written consent was obtained from the patient after a
thorough discussion of the procedural risks, benefits and
alternatives. All questions were addressed. Maximal Sterile Barrier
Technique was utilized including caps, mask, sterile gowns, sterile
gloves, sterile drape, hand hygiene and skin antiseptic. A timeout
was performed prior to the initiation of the procedure.

The patient was positioned prone and non-contrast localization CT
was performed of the pelvis to demonstrate the iliac marrow spaces.

Maximal barrier sterile technique utilized including caps, mask,
sterile gowns, sterile gloves, large sterile drape, hand hygiene,
and chlorhexidine prep.

Under sterile conditions and local anesthesia, an 11 gauge coaxial
bone biopsy needle was advanced into the RIGHT iliac marrow space.
Needle position was confirmed with CT imaging. Initially, bone
marrow aspiration was performed. Next, the 11 gauge outer cannula
was utilized to obtain a 1 iliac bone marrow core biopsy. Needle was
removed. Hemostasis was obtained with compression. The patient
tolerated the procedure well. Samples were prepared with the
cytotechnologist.
IMPRESSION: Successful CT-guided bone marrow aspiration and biopsy, as above.

## 2023-07-06 ENCOUNTER — Ambulatory Visit: Payer: 59 | Attending: Family Medicine

## 2023-07-07 ENCOUNTER — Telehealth: Payer: Self-pay | Admitting: Family Medicine

## 2023-07-07 NOTE — Telephone Encounter (Signed)
 Unable to call patient at number provided.

## 2023-07-07 NOTE — Telephone Encounter (Signed)
 Copied from CRM [id number]. Topic: General - Other >> Jul 07, 2023  7:58 AM Essie A wrote:  Reason for CRM: Patient is calling from [place] to speak with Dr. Newlin.  He is returning her call.  Please return his call at [phone].  >> Jul 07, 2023 10:31 AM Zipporah Him wrote: Patient some Ahmed calling from number, [phone]. States he is returning a call to the office in regard to his father. CAL no answer. Please call back when available.

## 2023-07-07 NOTE — Telephone Encounter (Signed)
 Son was called and I informed him to contact the patient and have him call and let us  know what is needed.

## 2023-07-07 NOTE — Telephone Encounter (Signed)
 Copied from CRM [id number]. Topic: General - Other >> Jul 07, 2023  7:58 AM Essie A wrote:  Reason for CRM: Patient is calling from [place] to speak with Dr. Newlin.  He is returning her call.  Please return his call at [phone].

## 2023-08-07 ENCOUNTER — Encounter (HOSPITAL_COMMUNITY): Payer: Self-pay | Admitting: Interventional Radiology

## 2024-02-19 ENCOUNTER — Other Ambulatory Visit: Payer: Self-pay

## 2024-02-19 ENCOUNTER — Encounter: Payer: Self-pay | Admitting: Internal Medicine

## 2024-02-19 ENCOUNTER — Emergency Department (HOSPITAL_BASED_OUTPATIENT_CLINIC_OR_DEPARTMENT_OTHER)

## 2024-02-19 ENCOUNTER — Emergency Department (HOSPITAL_BASED_OUTPATIENT_CLINIC_OR_DEPARTMENT_OTHER)
Admission: EM | Admit: 2024-02-19 | Discharge: 2024-02-20 | Disposition: A | Attending: Emergency Medicine | Admitting: Emergency Medicine

## 2024-02-19 DIAGNOSIS — N401 Enlarged prostate with lower urinary tract symptoms: Secondary | ICD-10-CM | POA: Diagnosis present

## 2024-02-19 DIAGNOSIS — Z5329 Procedure and treatment not carried out because of patient's decision for other reasons: Secondary | ICD-10-CM | POA: Insufficient documentation

## 2024-02-19 DIAGNOSIS — I10 Essential (primary) hypertension: Secondary | ICD-10-CM | POA: Diagnosis present

## 2024-02-19 DIAGNOSIS — R42 Dizziness and giddiness: Secondary | ICD-10-CM | POA: Insufficient documentation

## 2024-02-19 DIAGNOSIS — R531 Weakness: Secondary | ICD-10-CM

## 2024-02-19 DIAGNOSIS — Z8673 Personal history of transient ischemic attack (TIA), and cerebral infarction without residual deficits: Secondary | ICD-10-CM | POA: Insufficient documentation

## 2024-02-19 DIAGNOSIS — Z79899 Other long term (current) drug therapy: Secondary | ICD-10-CM | POA: Insufficient documentation

## 2024-02-19 DIAGNOSIS — G8929 Other chronic pain: Secondary | ICD-10-CM | POA: Diagnosis present

## 2024-02-19 DIAGNOSIS — R2990 Unspecified symptoms and signs involving the nervous system: Secondary | ICD-10-CM

## 2024-02-19 DIAGNOSIS — K219 Gastro-esophageal reflux disease without esophagitis: Secondary | ICD-10-CM | POA: Diagnosis present

## 2024-02-19 DIAGNOSIS — K5909 Other constipation: Secondary | ICD-10-CM | POA: Diagnosis present

## 2024-02-19 DIAGNOSIS — D751 Secondary polycythemia: Secondary | ICD-10-CM | POA: Diagnosis present

## 2024-02-19 DIAGNOSIS — R1013 Epigastric pain: Secondary | ICD-10-CM | POA: Insufficient documentation

## 2024-02-19 DIAGNOSIS — R112 Nausea with vomiting, unspecified: Secondary | ICD-10-CM | POA: Insufficient documentation

## 2024-02-19 DIAGNOSIS — Z7982 Long term (current) use of aspirin: Secondary | ICD-10-CM | POA: Insufficient documentation

## 2024-02-19 DIAGNOSIS — I2510 Atherosclerotic heart disease of native coronary artery without angina pectoris: Secondary | ICD-10-CM | POA: Diagnosis present

## 2024-02-19 DIAGNOSIS — I671 Cerebral aneurysm, nonruptured: Secondary | ICD-10-CM | POA: Diagnosis present

## 2024-02-19 LAB — DIFFERENTIAL
Abs Immature Granulocytes: 0.05 K/uL (ref 0.00–0.07)
Basophils Absolute: 0 K/uL (ref 0.0–0.1)
Basophils Relative: 0 %
Eosinophils Absolute: 0 K/uL (ref 0.0–0.5)
Eosinophils Relative: 0 %
Immature Granulocytes: 0 %
Lymphocytes Relative: 13 %
Lymphs Abs: 1.5 K/uL (ref 0.7–4.0)
Monocytes Absolute: 0.6 K/uL (ref 0.1–1.0)
Monocytes Relative: 5 %
Neutro Abs: 9.6 K/uL — ABNORMAL HIGH (ref 1.7–7.7)
Neutrophils Relative %: 82 %

## 2024-02-19 LAB — COMPREHENSIVE METABOLIC PANEL WITH GFR
ALT: 11 U/L (ref 0–44)
AST: 19 U/L (ref 15–41)
Albumin: 4.2 g/dL (ref 3.5–5.0)
Alkaline Phosphatase: 146 U/L — ABNORMAL HIGH (ref 38–126)
Anion gap: 13 (ref 5–15)
BUN: 12 mg/dL (ref 8–23)
CO2: 24 mmol/L (ref 22–32)
Calcium: 9.2 mg/dL (ref 8.9–10.3)
Chloride: 103 mmol/L (ref 98–111)
Creatinine, Ser: 0.79 mg/dL (ref 0.61–1.24)
GFR, Estimated: >60 mL/min
Glucose, Bld: 149 mg/dL — ABNORMAL HIGH (ref 70–99)
Potassium: 3.1 mmol/L — ABNORMAL LOW (ref 3.5–5.1)
Sodium: 140 mmol/L (ref 135–145)
Total Bilirubin: 1.2 mg/dL (ref 0.0–1.2)
Total Protein: 7.5 g/dL (ref 6.5–8.1)

## 2024-02-19 LAB — RESP PANEL BY RT-PCR (RSV, FLU A&B, COVID)  RVPGX2
Influenza A by PCR: NEGATIVE
Influenza B by PCR: NEGATIVE
Resp Syncytial Virus by PCR: NEGATIVE
SARS Coronavirus 2 by RT PCR: NEGATIVE

## 2024-02-19 LAB — URINALYSIS, ROUTINE W REFLEX MICROSCOPIC
Bilirubin Urine: NEGATIVE
Glucose, UA: NEGATIVE mg/dL
Hgb urine dipstick: NEGATIVE
Ketones, ur: NEGATIVE mg/dL
Leukocytes,Ua: NEGATIVE
Nitrite: NEGATIVE
Protein, ur: NEGATIVE mg/dL
Specific Gravity, Urine: 1.046 — ABNORMAL HIGH (ref 1.005–1.030)
pH: 7.5 (ref 5.0–8.0)

## 2024-02-19 LAB — CBC
HCT: 49.9 % (ref 39.0–52.0)
Hemoglobin: 17.1 g/dL — ABNORMAL HIGH (ref 13.0–17.0)
MCH: 27.5 pg (ref 26.0–34.0)
MCHC: 34.3 g/dL (ref 30.0–36.0)
MCV: 80.4 fL (ref 80.0–100.0)
Platelets: 218 K/uL (ref 150–400)
RBC: 6.21 MIL/uL — ABNORMAL HIGH (ref 4.22–5.81)
RDW: 14.1 % (ref 11.5–15.5)
WBC: 11.8 K/uL — ABNORMAL HIGH (ref 4.0–10.5)
nRBC: 0 % (ref 0.0–0.2)

## 2024-02-19 LAB — CBG MONITORING, ED: Glucose-Capillary: 141 mg/dL — ABNORMAL HIGH (ref 70–99)

## 2024-02-19 LAB — PROTIME-INR
INR: 0.9 (ref 0.8–1.2)
Prothrombin Time: 13 s (ref 11.4–15.2)

## 2024-02-19 LAB — TROPONIN T, HIGH SENSITIVITY: Troponin T High Sensitivity: <15 ng/L (ref 0–19)

## 2024-02-19 LAB — ETHANOL: Alcohol, Ethyl (B): <15 mg/dL

## 2024-02-19 LAB — LIPASE, BLOOD: Lipase: 21 U/L (ref 11–51)

## 2024-02-19 LAB — APTT: aPTT: 35 s (ref 24–36)

## 2024-02-19 MED ORDER — SODIUM CHLORIDE 0.9% FLUSH: 3.0000 mL | Freq: Once | INTRAVENOUS | Status: DC

## 2024-02-19 MED ORDER — MECLIZINE HCL 25 MG PO TABS
12.5000 mg | ORAL_TABLET | Freq: Once | ORAL | Status: AC
  Administered 2024-02-19: 12.5 mg via ORAL
  Filled 2024-02-19: qty 1

## 2024-02-19 MED ORDER — IOHEXOL 350 MG/ML SOLN
75.0000 mL | Freq: Once | INTRAVENOUS | Status: AC | PRN
  Administered 2024-02-19: 75 mL via INTRAVENOUS

## 2024-02-19 MED ORDER — SODIUM CHLORIDE 0.9 % IV BOLUS
500.0000 mL | Freq: Once | INTRAVENOUS | Status: AC
  Administered 2024-02-19: 500 mL via INTRAVENOUS

## 2024-02-19 NOTE — ED Notes (Signed)
 Activating code stroke per dr. Yolande

## 2024-02-19 NOTE — ED Notes (Signed)
 Patient transported to CT

## 2024-02-19 NOTE — ED Notes (Signed)
 ED Provider at bedside.

## 2024-02-19 NOTE — ED Provider Notes (Signed)
 " Aaron Dennis EMERGENCY DEPARTMENT AT [facility] Provider Note   CSN: [id number] Arrival date & time: 02/19/24  1947  An emergency department physician performed an initial assessment on this suspected stroke patient at 2008.  Patient presents with: Dizziness   Aaron Dennis is a 69 y.o. male.   History obtained via son and Arabic interpreter  69 year old male history of vertebral artery aneurysm, hypertension, and stroke with residual right-sided hemiparesis who presents to the emergency department with dizziness.  1-1/2 hours ago he reports that he started feeling dizzy.  Has difficulty characterizing as either lightheaded or vertigo.  Says he has become nauseous.  Also having some epigastric discomfort at that point in time.  Denies any other neurologic deficits.  [place] and had all of his medications changed show family is unsure if he is on blood thinners at this point in time.       Prior to Admission medications  Medication Sig Start Date End Date Taking? Authorizing Provider  acetaminophen  (TYLENOL ) 325 MG tablet Take 1-2 tablets (325-650 mg total) by mouth every 4 (four) hours as needed for mild pain. 05/19/21   Setzer, Sandra J, PA-C  amLODipine  (NORVASC ) 5 MG tablet Take 1 tablet (5 mg total) by mouth daily. 08/25/22   Newlin, Enobong, MD  aspirin  EC 81 MG tablet Take 1 tablet (81 mg total) by mouth daily. Swallow whole. 08/25/22   Newlin, Enobong, MD  citalopram  (CELEXA ) 20 MG tablet Take 1 tablet (20 mg total) by mouth daily. 06/11/22   Newlin, Enobong, MD  methocarbamol  (ROBAXIN ) 500 MG tablet Take 1 tablet (500 mg total) by mouth 2 (two) times daily. 08/25/22   Newlin, Enobong, MD  mirabegron  ER (MYRBETRIQ ) 50 MG TB24 tablet Take 1 tablet (50 mg total) by mouth daily. 07/09/22   Gaston Hamilton, MD  mirabegron  ER (MYRBETRIQ ) 50 MG TB24 tablet Take 1 tablet (50 mg total) by mouth daily. 08/12/22     mirabegron  ER (MYRBETRIQ ) 50 MG TB24 tablet Take 1  tablet (50 mg total) by mouth daily. 08/28/22   Gaston Hamilton, MD  omeprazole  (PRILOSEC) 40 MG capsule Take 1 capsule (40 mg total) by mouth daily. 06/11/22   Newlin, Enobong, MD  ondansetron  (ZOFRAN ) 4 MG tablet Take 1 tablet (4 mg total) by mouth every 6 (six) hours as needed for nausea or vomiting. 07/12/22   Ruthell Lonni FALCON, PA-C  polyethylene glycol powder (GLYCOLAX /MIRALAX ) 17 GM/SCOOP powder Take 17 grams by mouth daily. Patient taking differently: Take 17 g by mouth daily as needed for moderate constipation. 10/07/21   Newlin, Enobong, MD  rosuvastatin  (CRESTOR ) 20 MG tablet Take 1 tablet (20 mg total) by mouth daily. 06/11/22   Newlin, Enobong, MD  tamsulosin  (FLOMAX ) 0.4 MG CAPS capsule Take 1 capsule (0.4 mg total) by mouth daily. 08/25/22   Newlin, Enobong, MD  vitamin B-12 (CYANOCOBALAMIN ) 1000 MCG tablet Take 1 tablet (1,000 mcg total) by mouth daily. 08/15/20   Newlin, Enobong, MD    Allergies: Bovine (beef) protein-containing drug products, Fish protein-containing drug products, and Porcine (pork) protein-containing drug products    Review of Systems  Updated Vital Signs BP 117/79   Pulse 73   Temp 98.4 F (36.9 C) (Oral)   Resp 17   Ht 5' 10 (1.778 m)   Wt 76.7 kg   SpO2 99%   BMI 24.25 kg/m   Physical Exam Constitutional:      Comments: Uncomfortable and nauseous.  Eyes:  Conjunctiva/sclera: Conjunctivae normal.     Pupils: Pupils are equal, round, and reactive to light.  Cardiovascular:     Rate and Rhythm: Normal rate and regular rhythm.     Pulses: Normal pulses.     Heart sounds: Normal heart sounds.  Pulmonary:     Effort: Pulmonary effort is normal.     Breath sounds: Normal breath sounds.  Abdominal:     General: There is no distension.     Palpations: There is no mass.     Tenderness: There is no abdominal tenderness. There is no guarding.  Neurological:     Mental Status: He is alert.     Comments: MENTAL STATUS: AAOx3 CRANIAL NERVES: II:  Pupils equal and reactive 4 mm BL, no RAPD, no VF deficits III, IV, VI: Spontaneous fast beating leftward nystagmus V: normal sensation to light touch in V1, V2, and V3 segments bilaterally VII: no facial weakness or asymmetry, no nasolabial fold flattening VIII: normal hearing to speech and finger friction IX, X: normal palatal elevation, no uvular deviation XI: 5/5 head turn and 5/5 shoulder shrug bilaterally XII: midline tongue protrusion MOTOR: Weakness in right upper and right lower extremity from stroke Full strength in left upper and left lower extremity SENSORY: Normal sensation to light touch in all extremities     (all labs ordered are listed, but only abnormal results are displayed) Labs Reviewed  COMPREHENSIVE METABOLIC PANEL WITH GFR - Abnormal; Notable for the following components:      Result Value   Potassium 3.1 (*)    Glucose, Bld 149 (*)    Alkaline Phosphatase 146 (*)    All other components within normal limits  CBC - Abnormal; Notable for the following components:   WBC 11.8 (*)    RBC 6.21 (*)    Hemoglobin 17.1 (*)    All other components within normal limits  URINALYSIS, ROUTINE W REFLEX MICROSCOPIC - Abnormal; Notable for the following components:   Color, Urine COLORLESS (*)    Specific Gravity, Urine 1.046 (*)    All other components within normal limits  DIFFERENTIAL - Abnormal; Notable for the following components:   Neutro Abs 9.6 (*)    All other components within normal limits  CBG MONITORING, ED - Abnormal; Notable for the following components:   Glucose-Capillary 141 (*)    All other components within normal limits  RESP PANEL BY RT-PCR (RSV, FLU A&B, COVID)  RVPGX2  LIPASE, BLOOD  PROTIME-INR  APTT  ETHANOL  TROPONIN T, HIGH SENSITIVITY  TROPONIN T, HIGH SENSITIVITY    EKG: EKG Interpretation Date/Time:  Saturday February 19 2024 20:04:21 EST Ventricular Rate:  82 PR Interval:  164 QRS Duration:  96 QT Interval:  398 QTC  Calculation: 465 R Axis:   66  Text Interpretation: Sinus rhythm Confirmed by Yolande Charleston [phone]) on 02/19/2024 9:04:03 PM  Radiology: [facility] Chest Portable 1 View Result Date: 02/19/2024 CLINICAL DATA:  Weakness, dizziness EXAM: PORTABLE CHEST 1 VIEW COMPARISON:  None Available. FINDINGS: The heart size and mediastinal contours are within normal limits. Both lungs are clear. The visualized skeletal structures are unremarkable. IMPRESSION: No active disease. Electronically Signed   By: Ozell Daring M.D.   On: 02/19/2024 21:35   CT ANGIO HEAD NECK W WO CM Result Date: 02/19/2024 EXAM: CTA HEAD AND NECK WITHOUT AND WITH 02/19/2024 08:27:51 PM TECHNIQUE: CTA of the head and neck was performed without and with the administration of 75 mL of iohexol  (OMNIPAQUE ) 350 MG/ML  injection. Multiplanar 2D and/or 3D reformatted images are provided for review. Automated exposure control, iterative reconstruction, and/or weight based adjustment of the mA/kV was utilized to reduce the radiation dose to as low as reasonably achievable. Stenosis of the internal carotid arteries measured using NASCET criteria. COMPARISON: None available CLINICAL HISTORY: Vertebral artery disease, vertigo. FINDINGS: CTA NECK: AORTIC ARCH AND ARCH VESSELS: No dissection or arterial injury. No significant stenosis of the brachiocephalic or subclavian arteries. CERVICAL CAROTID ARTERIES: No dissection, arterial injury, or hemodynamically significant stenosis by NASCET criteria. CERVICAL VERTEBRAL ARTERIES: No dissection, arterial injury, or significant stenosis. LUNGS AND MEDIASTINUM: Hazy opacity in the right lower lobe. SOFT TISSUES: No acute abnormality. BONES: Diffuse mixed lytic and sclerotic change throughout the skeleton. CTA HEAD: ANTERIOR CIRCULATION: No significant stenosis of the internal carotid arteries. No significant stenosis of the anterior cerebral arteries. No significant stenosis of the middle cerebral arteries. No aneurysm.  POSTERIOR CIRCULATION: Dolichoectasia of the vertebrobasilar system. There is a superiorly directed 3 mm aneurysm arising from the distal V4 segment of the right vertebral artery. No significant stenosis of the posterior cerebral arteries. No significant stenosis of the basilar artery. No significant stenosis of the vertebral arteries. OTHER: No dural venous sinus thrombosis on this non-dedicated study. IMPRESSION: 1. No emergent large vessel occlusion, dissection or hemodynamically significant stenosis. 2. Superiorly directed 3 mm aneurysm arising from the distal V4 segment of the right vertebral artery. 3. Vertebrobasilar dolichoectasia. 4. Mixed lytic and sclerotic change within the visible skeleton, unchanged. Electronically signed by: Franky Stanford MD MD 02/19/2024 08:42 PM EST RP Workstation: [id number]   CT HEAD CODE STROKE WO CONTRAST Result Date: 02/19/2024 EXAM: CT HEAD WITHOUT CONTRAST 02/19/2024 08:22:37 PM TECHNIQUE: CT of the head was performed without the administration of intravenous contrast. Automated exposure control, iterative reconstruction, and/or weight based adjustment of the mA/kV was utilized to reduce the radiation dose to as low as reasonably achievable. COMPARISON: None available. CLINICAL HISTORY: Neuro deficit, acute, stroke suspected. FINDINGS: BRAIN AND VENTRICLES: No acute hemorrhage. No evidence of acute infarct. Age-related cerebral atrophy with severe chronic microvascular ischemic disease. Small remote lacunar infarct at right basal ganglia. No hydrocephalus. No extra-axial collection. No mass effect or midline shift. Calcified atherosclerosis at skull base. Dolichoectatic intracranial circulation. [place] Stroke Program Early CT Score (ASPECTS): Ganglionic (caudate, internal capsule, lentiform nucleus, insula, M1-M3) 7, Supraganglionic (M4-M6) 3. Total 10. ORBITS: Remote posttraumatic defect at left lamina papyracea. SINUSES: No acute abnormality. SOFT TISSUES AND SKULL: No  acute soft tissue abnormality. No skull fracture. IMPRESSION: 1. No acute intracranial abnormality, ASPECTS 10. 2. Age-related cerebral atrophy with severe chronic microvascular ischemic disease and a small remote lacunar infarct at the right basal ganglia. Findings communicated to Dr. Lamar Shan at 8:27 PM on 02/19/2024. Electronically signed by: Franky Stanford MD MD 02/19/2024 08:27 PM EST RP Workstation: [id number]     Procedures   Medications Ordered in the ED  sodium chloride  flush (NS) 0.9 % injection 3 mL (has no administration in time range)  iohexol  (OMNIPAQUE ) 350 MG/ML injection 75 mL (75 mLs Intravenous Contrast Given 02/19/24 2029)  sodium chloride  0.9 % bolus 500 mL ( Intravenous Stopped 02/19/24 2204)  meclizine  (ANTIVERT ) tablet 12.5 mg (12.5 mg Oral Given 02/19/24 2123)    Clinical Course as of 02/20/24 0924  Sat Feb 19, 2024  2026 Called by radiology non-contrast head CT without acute findings.  [RP]  2103 Dr Donata talked to the patient. States the lkw is still somewhat difficult to ascertain. Doesn't  have any new deficits currently and is feeling better. Recommends bringing him in for a stroke workup.  [RP]    Clinical Course User Index [RP] Yolande Lamar BROCKS, MD                                 Medical Decision Making Amount and/or Complexity of Data Reviewed Labs: ordered. Radiology: ordered.  Risk Prescription drug management. Decision regarding hospitalization.   Aaron Dennis is a 69 year old male history of vertebral artery aneurysm, hypertension, and stroke with residual right-sided hemiparesis who presents to the emergency department with dizziness.   Initial Ddx:  Stroke, ICH, dissection, vertigo, MI  MDM/Course:  Patient presents emergency department with dizziness.  Also reports some nausea and vomiting.  Does have a history of vertebral artery aneurysm.  On exam has some leftover deficits from an old stroke but also has fast beating nystagmus to  the left.  Family believes this is new.  On his hints exam does not have a catch-up saccade on head impulse.  However, does not have abnormal test of skew.  Findings at this point in time are equivocal for central versus peripheral vertigo.  Given his history of vertebral artery disease and stroke along with his constellation of symptoms did activate a code stroke.  Family is looking to verify if he is on anticoagulation or not.  Upon re-evaluation no new symptoms.  Was seen by teleneurology who felt that he may have actually been out of the window for TNK.  Recommended admission for stroke workup and generalized infectious workup as well.  CT head unremarkable.  CTA head and neck without any other acute changes.  Mated to hospitalist for further management  This patient presents to the ED for concern of complaints listed in HPI, this involves an extensive number of treatment options, and is a complaint that carries with it a high risk of complications and morbidity. Disposition including potential need for admission considered.   Dispo: Admit to Floor  I have reviewed the patients home medications and made adjustments as needed Additional history obtained from son Records reviewed Outpatient Clinic Notes The following labs were independently interpreted: Chemistry and show no acute abnormality I independently reviewed the following imaging with scope of interpretation limited to determining acute life threatening conditions related to emergency care: CT Head and agree with the radiologist interpretation with the following exceptions: none I personally reviewed and interpreted cardiac monitoring: normal sinus rhythm  I personally reviewed and interpreted the pt's EKG: see above for interpretation  Consults: Hospitalist and Neurology Social Determinants of health:  Arabic speaking  Portions of this note were generated with Scientist, clinical (histocompatibility and immunogenetics). Dictation errors may occur despite best attempts  at proofreading.     Final diagnoses:  Stroke-like symptoms  Vertigo    ED Discharge Orders     None          Yolande Lamar BROCKS, MD 02/20/24 [phone]  "

## 2024-02-19 NOTE — Consult Note (Signed)
 [facility] Consult Services   Patient Name:   Aaron Dennis, Aaron Dennis Date of Birth:   05-06-1955 Date of Service:   02/19/2024 20:15:15  Diagnosis:       R42 - Dizziness/ Vertigo/ Giddiness  Impression:      69 year old male with a history of stroke with residual right side weakness who presents because of an episode of vertigo and vomiting. Presentation may be due to peripheral vertigo vs posterior circulation stroke.  Our recommendations are outlined below.  Recommendations:        Stroke/Telemetry Floor       Neuro Checks (Q4)       Bedside Swallow Eval       DVT Prophylaxis       IV Fluids, Normal Saline       Head of Bed 30 Degrees       Euglycemia and Avoid Hyperthermia (PRN Acetaminophen )       Initiate or continue Aspirin  81 MG daily  Sign Out:       Discussed with Emergency Department Provider    ------------------------------------------------------------------------------  Metrics: Last Known Well: 02/19/2024 15:00:00 Arrival Time: 02/19/2024 19:47:00 Activation Time: 02/19/2024 20:25:16 Initial Response Time: 02/19/2024 20:25:45 Symptoms: dizziness. Initial patient interaction: 02/19/2024 20:36:41 NIHSS Assessment Completed: 02/19/2024 20:56:59 Patient is not a candidate for Thrombolytic. Thrombolytic Medical Decision: 02/19/2024 20:57:01 Patient was not deemed candidate for Thrombolytic because of following reasons: LKW outside 4.5 hr window. . Stroke severity too mild (non-disabling) .  CT Head: I personally reviewed all the CT images that were available to me and it showed: No acute process  Primary Provider Notified of Diagnostic Impression and Management Plan on: 02/19/2024 21:02:49    ------------------------------------------------------------------------------  History of Present Illness: Patient is a 69 year old Male.  Patient was brought by private transportation with symptoms of dizziness. 69 year old male  with a history of stroke with residual right side weakness who presents to the hospital because of dizziness, nausea, and epigastric discomfort. Patient spent most of the day in bed and around 17:45 son went to check on him because patient was unable to get up from the bed because of vertigo and he had an episode of vomiting. On teleneuro evaluation patient said his vertigo was a bit better. He did not have any new weakness, numbness, or new limb ataxia.   Past Medical History:      Hypertension      Stroke  Medications:  No Anticoagulant use  Antiplatelet use: Yes Aspirin  Reviewed EMR for current medications  Allergies:  Reviewed  Social History: Drug Use: No  Family History:  There is no family history of premature cerebrovascular disease pertinent to this consultation  ROS : 14 Points Review of Systems was performed and was negative except mentioned in HPI.  Past Surgical History: There Is No Surgical History Contributory To Todays Visit    Examination: BP(153/92), Pulse(80), 1A: Level of Consciousness - Alert; keenly responsive + 0 1B: Ask Month and Age - Both Questions Right + 0 1C: Blink Eyes & Squeeze Hands - Performs Both Tasks + 0 2: Test Horizontal Extraocular Movements - Normal + 0 3: Test Visual Fields - No Visual Loss + 0 4: Test Facial Palsy (Use Grimace if Obtunded) - Normal symmetry + 0 5A: Test Left Arm Motor Drift - No Drift for 10 Seconds + 0 5B: Test Right Arm Motor Drift - Some Effort Against Gravity + 2 6A: Test Left Leg Motor Drift - No Drift  for 5 Seconds + 0 6B: Test Right Leg Motor Drift - No Drift for 5 Seconds + 0 7: Test Limb Ataxia (FNF/Heel-Shin) - No Ataxia + 0 8: Test Sensation - Mild-Moderate Loss: Less Sharp/More Dull + 1 9: Test Language/Aphasia - Normal; No aphasia + 0 10: Test Dysarthria - Mild-Moderate Dysarthria: Slurring but can be understood + 1 11: Test Extinction/Inattention - No abnormality + 0  NIHSS Score: 4  NIHSS  Free Text : deficits are baseline per son  Pre-Morbid Modified Rankin Scale: 2 Points = Slight disability; unable to carry out all previous activities, but able to look after own affairs without assistance  Spoke with : Dr. Yolande  This consult was conducted in real time using interactive audio and immunologist. Patient was informed of the technology being used for this visit and agreed to proceed. Patient located in hospital and provider located at home/office setting.   Patient is being evaluated for possible acute neurologic impairment and high probability of imminent or life-threatening deterioration. I spent total of 31 minutes providing care to this patient, including time for face to face visit via telemedicine, review of medical records, imaging studies and discussion of findings with providers, the patient and/or family.    Dr Kerri Gables   [facility] For Inpatient follow-up with [facility] physician please call [facility] at [phone]. As we are not an outpatient service for any post hospital discharge needs please contact the hospital for assistance. If you have any questions for the [facility] physicians or need to reconsult for clinical or diagnostic changes please contact us  via [facility] at [phone].  Non-radiologist review of imaging performed to assist with emergent clinical decision-making. Remote physician workstations do not possess the same resolution, calibration, or diagnostic capabilities as hospital-based radiology reading stations, and formal radiologist read is necessary.   Signature : Kerri Gables

## 2024-02-19 NOTE — Progress Notes (Signed)
 Plan of Care Note for accepted transfer   Patient: Aaron Dennis MRN: [id number]   DOA: 02/19/2024  Facility requesting transfer: [facility]   Requesting Provider: Dr. Jakie   Reason for transfer: Vertigo   Facility course: 69 yr old man with HTN, polycythemia, CVA, and chronic back pain presents with acute-onset dizziness and N/V.   There are no acute findings on head CT or CTA head and neck. Respiratory virus panel is in process.   He was evaluated by teleneurology who recommended admission for stroke workup.   He was treated with meclizine  and IVF.   Plan of care: The patient is accepted for admission to Telemetry unit, at [facility].   Author: Evalene GORMAN Sprinkles, MD 02/19/2024  Check [email or web address] for on-call coverage.  Nursing staff, Please call [facility] Admits & Consults System-Wide number on Amion as soon as patient's arrival, so appropriate admitting provider can evaluate the pt.

## 2024-02-19 NOTE — ED Triage Notes (Addendum)
 Per patients son/arabic interpreter, onset of dizziness and nausea - started 1.5 hours ago. Unsure if on blood thinners. Hx of stroke 2023 with right sided weakness.Denies new numbness or tingling.  Epigastric pain that started after the vomiting.

## 2024-02-20 LAB — TROPONIN T, HIGH SENSITIVITY: Troponin T High Sensitivity: <15 ng/L (ref 0–19)

## 2024-02-20 MED ORDER — VITAMIN B-12 1000 MCG PO TABS: 1000.0000 ug | ORAL_TABLET | Freq: Every day | ORAL | Status: DC

## 2024-02-20 MED ORDER — METHOCARBAMOL 500 MG PO TABS: 500.0000 mg | ORAL_TABLET | Freq: Two times a day (BID) | ORAL | Status: DC

## 2024-02-20 MED ORDER — MIRABEGRON ER 50 MG PO TB24: 50.0000 mg | ORAL_TABLET | Freq: Every day | ORAL | Status: DC

## 2024-02-20 MED ORDER — ACETAMINOPHEN 325 MG PO TABS: 325.0000 mg | ORAL_TABLET | ORAL | Status: DC | PRN

## 2024-02-20 MED ORDER — ONDANSETRON HCL 4 MG PO TABS: 4.0000 mg | ORAL_TABLET | Freq: Four times a day (QID) | ORAL | Status: DC | PRN

## 2024-02-20 MED ORDER — POLYETHYLENE GLYCOL 3350 17 G PO PACK: 17.0000 g | PACK | Freq: Every day | ORAL | Status: DC | PRN

## 2024-02-20 MED ORDER — ROSUVASTATIN CALCIUM 20 MG PO TABS: 20.0000 mg | ORAL_TABLET | Freq: Every day | ORAL | Status: DC

## 2024-02-20 MED ORDER — CITALOPRAM HYDROBROMIDE 20 MG PO TABS: 20.0000 mg | ORAL_TABLET | Freq: Every day | ORAL | Status: DC

## 2024-02-20 MED ORDER — ASPIRIN 81 MG PO TBEC: 81.0000 mg | DELAYED_RELEASE_TABLET | Freq: Every day | ORAL | Status: DC

## 2024-02-20 MED ORDER — TAMSULOSIN HCL 0.4 MG PO CAPS: 0.4000 mg | ORAL_CAPSULE | Freq: Every day | ORAL | Status: DC

## 2024-02-20 MED ORDER — PANTOPRAZOLE SODIUM 40 MG PO TBEC: 40.0000 mg | DELAYED_RELEASE_TABLET | Freq: Every day | ORAL | Status: DC

## 2024-02-20 NOTE — Assessment & Plan Note (Addendum)
 Resume home Flomax   Urinary incontinence, chronic Resume home Myrbetriq 

## 2024-02-20 NOTE — ED Notes (Signed)
 Virtual assessment being conducted by Dr. Signa. Assisted with assessment.

## 2024-02-20 NOTE — Assessment & Plan Note (Signed)
 CTA head/neck: Superiorly directed 3 mm aneurysm arising from the distal V4 segment of the right vertebral artery. Also present on study in 2023. Stable.

## 2024-02-20 NOTE — Assessment & Plan Note (Signed)
 Hemoglobin 17.1. Monitor CBC

## 2024-02-20 NOTE — ED Notes (Signed)
 Pt requested to leave AMA... Pt AO x4, understood what leaving AMA... Pt appeared to be NIH 0, nothing new from Prev Stroke... Hospitalist Contacted Georgia Cunning), everything was explained to the MD. The MD stated that during her exam through the Ipad, she already had the AMA discussion... The MD cleared the Pt for AMA... Pt was assisted to a wheelchair and wheeled to the Family vehicle.[email or web address]

## 2024-02-20 NOTE — ED Notes (Addendum)
 Pt is requesting to leave and not be transported to [facility] for the MRI... Contact with the Provider attempted, no answer.. Sec attempting to contact [facility] for additional guidance on who to contact.

## 2024-02-20 NOTE — Assessment & Plan Note (Signed)
 Resume home Robaxin , Tylenol  as needed. Titrate pain control regimen as needed. PT evaluation as above. Encourage mobility as tolerable.

## 2024-02-20 NOTE — Assessment & Plan Note (Signed)
 Residual from prior stroke.  Patient reports this has gradually worsened over time and limiting some of his ability to independently ADLs. --Stroke evaluation as above including PT and OT evaluations

## 2024-02-20 NOTE — H&P (Addendum)
 "  Telemedicine History and Physical    Patient: Aaron Dennis FMW:[id number] DOB: [birth year] DOA: 02/19/2024 DOS: the patient was seen and examined on 02/20/2024 PCP: Delbert Clam, MD   Referring Provider: Dr. Jakie  Telemedicine Provider: Burnard Cunning, DO Patient Location: [facility] Referring Diagnosis: Vertigo Patient Name and DOB verified: Aaron Dennis, [birth year] Patient consented to Telemedicine Evaluation: Yes RN virtual assistant: Arlyne Fujisawa, RN Video encounter time and date: 02/20/2024 8:48 PM   ADDENDUM 10:40 AM --- Notified by ED staff that patient now declining admission and wants to leave AMA.  On my virtual encounter for admission, patient and son asked about inpatient vs observation vs outpatient level of care, and expressed great concern over the financial obligation and out of pocket cost of observation level.  I informed patient and son of the importance and reason for expedited work up in the hospital, and risks of deferring or delaying evaluation.  They stated they would discuss it, look at insurance coverage and were going to consider admission versus going home.  I advised that would need to be Against Medical Advice, and they expressed agreement and understanding.  Discussed with Fonda Mall, Paramedic over the phone.  Patient's current status is stable with resolved dizziness since last night and patient reporting he feels entirely at his baseline.  Pt has capacity to make decisions, and has elected to leave AMA.   Paramedic to have patient sign AMA form.      Patient coming from: Home  Chief Complaint:  Chief Complaint  Patient presents with   Dizziness   HPI: Aaron Dennis is a 69 y.o. male with medical history significant of HTN, polycythemia, CVA with residual right-sided weakness, and chronic back pain who presented to [facility] ED last evening for evaluation of an episode of acute onset dizziness/vertigo with associated nausea and  vomiting.   Pt speaks Arabic, understand English fairly well. Son present at bedside assisted with interpreting and history.  Pt reports suddenly becoming dizzy, then started vomiting while laying down in bed yesterday evening.  He denies prior dizziness episodes like this.  No other recent illnesses or symptoms including fever/chills, cough, sore throat, congestions, abdominal pain or diarrhea, no UTI symptoms.  He reports chronic right-sided weakness of both arm and leg from his prior stroke.  He feels this has gotten progressively but slowly worse over time since that stroke. There are day-to-day activities around home he could do after the stroke that he's no longer able to do (example son provided was going into the kitchen, fixing tea and bringing it to living area).    ED course -  Initial vitals -temp 29F, HR 80, RR 23, initial BP 152/92, later 121/86, SpO2 99% on room air. Labs obtained including CMP and CBC were notable for potassium 3.1, glucose 149, alk phos 146, WBC 11.8, hemoglobin 17.7. Troponin was negative x 2. Respiratory viral PCR is negative for COVID, flu A/B and RSV. Urinalysis without signs of infection. At the alcohol level negative. Imaging-- - CT head without contrast showed no acute abnormalities.  Did show age-related cerebral atrophy and severe chronic microvascular ischemic disease with a small remote lacunar infarct at the right basal ganglia. - CT angio head and neck showed no large vessel occlusions, dissection or hemodynamically significant stenosis.  Did show 3 mm aneurysm at the distal V4 segment of the right vertebral artery, vertebrobasilar dolichoectasia and mixed lytic and sclerotic changes of the visible skeleton unchanged from prior. - Portable  chest x-ray was negative.  Patient was evaluated by teleneurology with recommendation for admission for stroke evaluation given vertigo with vomiting could be posterior circulation stroke versus peripheral  vertigo.  Patient is admitted to telemetry floor for observation and further evaluation management as outlined detailed below.   Review of Systems: As mentioned in the history of present illness. All other systems reviewed and are negative.   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 He has never used smokeless tobacco. He reports that he does not drink alcohol and does not use drugs.  Allergies[1]  Family History  Problem Relation Age of Onset   Healthy Mother    Diabetes Brother    Colon polyps Neg Hx    Colon cancer Neg Hx    Esophageal cancer Neg Hx    Rectal cancer Neg Hx    Stomach cancer Neg Hx     Prior to Admission medications  Medication Sig Start Date End Date Taking? Authorizing Provider  acetaminophen  (TYLENOL ) 325 MG tablet Take 1-2 tablets (325-650 mg total) by mouth every 4 (four) hours as needed for mild pain. 05/19/21   Setzer, Sandra J, PA-C  amLODipine  (NORVASC ) 5 MG tablet Take 1 tablet (5 mg total) by mouth daily. 08/25/22   Newlin, Enobong, MD  aspirin  EC 81 MG tablet Take 1 tablet (81 mg total) by mouth daily. Swallow whole. 08/25/22    Newlin, Enobong, MD  citalopram  (CELEXA ) 20 MG tablet Take 1 tablet (20 mg total) by mouth daily. 06/11/22   Newlin, Enobong, MD  methocarbamol  (ROBAXIN ) 500 MG tablet Take 1 tablet (500 mg total) by mouth 2 (two) times daily. 08/25/22   Newlin, Enobong, MD  mirabegron  ER (MYRBETRIQ ) 50 MG TB24 tablet Take 1 tablet (50 mg total) by mouth daily. 07/09/22   Gaston Hamilton, MD  mirabegron  ER (MYRBETRIQ ) 50 MG TB24 tablet Take 1 tablet (50 mg total) by mouth daily. 08/12/22     mirabegron  ER (MYRBETRIQ ) 50 MG TB24 tablet Take 1 tablet (50 mg total) by mouth daily. 08/28/22   Gaston Hamilton, MD  omeprazole  (PRILOSEC) 40 MG capsule Take 1 capsule (40 mg total) by mouth daily. 06/11/22   Newlin, Enobong, MD  ondansetron  (ZOFRAN ) 4 MG tablet Take 1 tablet (4 mg total) by mouth every 6 (six) hours as needed for nausea or vomiting. 07/12/22   Ruthell Lonni FALCON, PA-C  polyethylene glycol powder (GLYCOLAX /MIRALAX ) 17 GM/SCOOP powder Take 17 grams by mouth daily. Patient taking differently: Take 17 g by mouth daily as needed for moderate constipation. 10/07/21   Newlin, Enobong, MD  rosuvastatin  (CRESTOR ) 20 MG tablet Take 1 tablet (  20 mg total) by mouth daily. 06/11/22   Newlin, Enobong, MD  tamsulosin  (FLOMAX ) 0.4 MG CAPS capsule Take 1 capsule (0.4 mg total) by mouth daily. 08/25/22   Newlin, Enobong, MD  vitamin B-12 (CYANOCOBALAMIN ) 1000 MCG tablet Take 1 tablet (1,000 mcg total) by mouth daily. 08/15/20   Delbert Clam, MD    Physical Exam: Vitals:   02/20/24 0148 02/20/24 0549 02/20/24 0700 02/20/24 1010  BP: 122/86 123/79 117/79 (!) 138/91  Pulse: 77 71 73 80  Resp: 19 17  16   Temp:  98.4 F (36.9 C)    TempSrc:  Oral    SpO2: 98% 98% 99% 99%  Weight:      Height:        Bedside physical exam was performed by RN listed above. Below exam findings are based on their in person physical exam findings and my observations during virtual encounter.  General exam: awake, alert, no acute  distress HEENT: voice clear, hearing grossly normal  Respiratory system: CTAB, no wheezes, rales or rhonchi, normal respiratory effort. Cardiovascular system: normal S1/S2, RRR, no murmurs heard   Gastrointestinal system: soft, NT, ND, +bowel sounds. Central nervous system: normal speech, right grip relatively weaker than left (baseline per pt), CN II-XII grossly intact, Skin: dry, intact, normal temperature  Psychiatry: normal mood, congruent affect, judgement and insight appear normal   Data Reviewed:  As reviewed in detail above  Assessment and Plan:  * Vertigo Seen by TeleNeurology while at [facility].  Etiology could be peripheral vertigo / BPPV vs. Posterior circulation stroke. CT head w/o contrast negative for acute findings. Showed severe chronic microvascular ischemic disease and small remote lacunar infarct right basal ganglia, age-related atrophy. CTA head & neck showed no LVO, dissection or hemodynamically significant stenosis, and other findings detailed above. --Admit for observation --Telemetry --Neuro checks --PT & OT evaluation --Bedside swallow, SLP evaluation --IV fluids --Resume home aspirin  and statin --Lipid panel & A1c --MRI brain --Echo --Hold antihypertensives until stroke ruled out, permissive HTN  Aneurysm, cerebral, nonruptured CTA head/neck: Superiorly directed 3 mm aneurysm arising from the distal V4 segment of the right vertebral artery. Also present on study in 2023. Stable.  Benign prostatic hyperplasia with nocturia Resume home Flomax   Urinary incontinence, chronic Resume home Myrbetriq   Status post stroke due to cerebrovascular disease .  Right sided weakness Residual from prior stroke.  Patient reports this has gradually worsened over time and limiting some of his ability to independently ADLs. --Stroke evaluation as above including PT and OT evaluations  Polycythemia Hemoglobin 17.1. Monitor CBC  Essential hypertension Hold  antihypertensives until stroke is ruled out.  Gastroesophageal reflux disease without esophagitis Resume PPI  Coronary artery calcification Resume aspirin  and statin  Chronic back pain Resume home Robaxin , Tylenol  as needed. Titrate pain control regimen as needed. PT evaluation as above. Encourage mobility as tolerable.  Constipation, chronic MiraLAX  as needed      Advance Care Planning: Code status - full code  Consults: Neurology  Family Communication: Son present at bedside during virtual admission encounter  Severity of Illness: The appropriate patient status for this patient is OBSERVATION. Observation status is judged to be reasonable and necessary in order to provide the required intensity of service to ensure the patient's safety. The patient's presenting symptoms, physical exam findings, and initial radiographic and laboratory data in the context of their medical condition is felt to place them at decreased risk for further clinical deterioration. Furthermore, it is anticipated that the patient will be  medically stable for discharge from the hospital within 2 midnights of admission.   Author: Burnard DELENA Cunning, DO 02/20/2024 10:36 AM  For on call review [email or web address].      [1]  Allergies Allergen Reactions   Bovine (Beef) Protein-Containing Drug Products     Cultural preference   Fish Protein-Containing Drug Products     Does not eat fish   Porcine (Pork) Protein-Containing Drug Products Other (See Comments)    Cultural preference    "

## 2024-02-20 NOTE — Assessment & Plan Note (Signed)
"   MiraLAX  as needed  "

## 2024-02-20 NOTE — Assessment & Plan Note (Signed)
 Aaron Dennis

## 2024-02-20 NOTE — Assessment & Plan Note (Signed)
 Hold antihypertensives until stroke is ruled out.

## 2024-02-20 NOTE — Assessment & Plan Note (Addendum)
 Seen by TeleNeurology while at [facility].  Etiology could be peripheral vertigo / BPPV vs. Posterior circulation stroke. CT head w/o contrast negative for acute findings. Showed severe chronic microvascular ischemic disease and small remote lacunar infarct right basal ganglia, age-related atrophy. CTA head & neck showed no LVO, dissection or hemodynamically significant stenosis, and other findings detailed above. --Admit for observation --Telemetry --Neuro checks --PT & OT evaluation --Bedside swallow, SLP evaluation --IV fluids --Resume home aspirin  and statin --Lipid panel & A1c --MRI brain --Echo --Hold antihypertensives until stroke ruled out, permissive HTN

## 2024-02-20 NOTE — Assessment & Plan Note (Signed)
Resume aspirin and statin. 

## 2024-02-20 NOTE — Assessment & Plan Note (Signed)
-   Resume PPI

## 2024-02-22 ENCOUNTER — Other Ambulatory Visit (HOSPITAL_BASED_OUTPATIENT_CLINIC_OR_DEPARTMENT_OTHER): Payer: Self-pay

## 2024-02-22 ENCOUNTER — Other Ambulatory Visit: Payer: Self-pay
